# Patient Record
Sex: Male | Born: 1935 | Race: White | Hispanic: No | Smoking: Current every day smoker
Health system: Southern US, Community
[De-identification: ages and names within clinical notes are randomized; demographics above are authoritative.]

## PROBLEM LIST (undated history)

## (undated) DIAGNOSIS — R053 Chronic cough: Secondary | ICD-10-CM

## (undated) DIAGNOSIS — J3089 Other allergic rhinitis: Secondary | ICD-10-CM

## (undated) DIAGNOSIS — R05 Cough: Secondary | ICD-10-CM

## (undated) DIAGNOSIS — J9690 Respiratory failure, unspecified, unspecified whether with hypoxia or hypercapnia: Secondary | ICD-10-CM

## (undated) DIAGNOSIS — Z720 Tobacco use: Secondary | ICD-10-CM

## (undated) DIAGNOSIS — I480 Paroxysmal atrial fibrillation: Secondary | ICD-10-CM

## (undated) DIAGNOSIS — A0472 Enterocolitis due to Clostridium difficile, not specified as recurrent: Secondary | ICD-10-CM

## (undated) DIAGNOSIS — R062 Wheezing: Secondary | ICD-10-CM

## (undated) DIAGNOSIS — K319 Disease of stomach and duodenum, unspecified: Secondary | ICD-10-CM

## (undated) DIAGNOSIS — M1990 Unspecified osteoarthritis, unspecified site: Secondary | ICD-10-CM

## (undated) DIAGNOSIS — I10 Essential (primary) hypertension: Secondary | ICD-10-CM

## (undated) DIAGNOSIS — C801 Malignant (primary) neoplasm, unspecified: Secondary | ICD-10-CM

## (undated) DIAGNOSIS — Z85820 Personal history of malignant melanoma of skin: Secondary | ICD-10-CM

## (undated) DIAGNOSIS — R0600 Dyspnea, unspecified: Secondary | ICD-10-CM

## (undated) DIAGNOSIS — N183 Chronic kidney disease, stage 3 unspecified: Secondary | ICD-10-CM

## (undated) DIAGNOSIS — I499 Cardiac arrhythmia, unspecified: Secondary | ICD-10-CM

## (undated) DIAGNOSIS — R809 Proteinuria, unspecified: Secondary | ICD-10-CM

## (undated) DIAGNOSIS — F419 Anxiety disorder, unspecified: Secondary | ICD-10-CM

## (undated) DIAGNOSIS — R2689 Other abnormalities of gait and mobility: Secondary | ICD-10-CM

## (undated) DIAGNOSIS — F32A Depression, unspecified: Secondary | ICD-10-CM

## (undated) DIAGNOSIS — N281 Cyst of kidney, acquired: Secondary | ICD-10-CM

## (undated) DIAGNOSIS — R7989 Other specified abnormal findings of blood chemistry: Secondary | ICD-10-CM

## (undated) DIAGNOSIS — J811 Chronic pulmonary edema: Secondary | ICD-10-CM

## (undated) DIAGNOSIS — K219 Gastro-esophageal reflux disease without esophagitis: Secondary | ICD-10-CM

## (undated) DIAGNOSIS — D649 Anemia, unspecified: Secondary | ICD-10-CM

## (undated) DIAGNOSIS — N402 Nodular prostate without lower urinary tract symptoms: Secondary | ICD-10-CM

## (undated) DIAGNOSIS — E46 Unspecified protein-calorie malnutrition: Secondary | ICD-10-CM

## (undated) DIAGNOSIS — N189 Chronic kidney disease, unspecified: Secondary | ICD-10-CM

## (undated) DIAGNOSIS — F1010 Alcohol abuse, uncomplicated: Secondary | ICD-10-CM

## (undated) DIAGNOSIS — J189 Pneumonia, unspecified organism: Secondary | ICD-10-CM

## (undated) DIAGNOSIS — J449 Chronic obstructive pulmonary disease, unspecified: Secondary | ICD-10-CM

## (undated) DIAGNOSIS — K922 Gastrointestinal hemorrhage, unspecified: Secondary | ICD-10-CM

## (undated) DIAGNOSIS — F329 Major depressive disorder, single episode, unspecified: Secondary | ICD-10-CM

## (undated) HISTORY — PX: ANKLE FRACTURE SURGERY: SHX122

## (undated) HISTORY — PX: TYMPANOPLASTY W/ MASTOIDECTOMY: SUR1400

## (undated) HISTORY — DX: Unspecified osteoarthritis, unspecified site: M19.90

## (undated) HISTORY — PX: EXTERNAL EAR SURGERY: SHX627

## (undated) HISTORY — DX: Personal history of malignant melanoma of skin: Z85.820

## (undated) HISTORY — DX: Tobacco use: Z72.0

## (undated) HISTORY — PX: TONSILLECTOMY: SUR1361

## (undated) HISTORY — PX: ROTATOR CUFF REPAIR: SHX139

---
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
## 1998-12-07 ENCOUNTER — Emergency Department (HOSPITAL_COMMUNITY): Admission: EM | Admit: 1998-12-07 | Discharge: 1998-12-07 | Payer: Self-pay | Admitting: *Deleted

## 1998-12-21 ENCOUNTER — Encounter: Payer: Self-pay | Admitting: Otolaryngology

## 1998-12-21 ENCOUNTER — Encounter: Admission: RE | Admit: 1998-12-21 | Discharge: 1998-12-21 | Payer: Self-pay | Admitting: Otolaryngology

## 2003-05-06 ENCOUNTER — Other Ambulatory Visit: Payer: Self-pay

## 2004-06-24 ENCOUNTER — Emergency Department: Payer: Self-pay | Admitting: Emergency Medicine

## 2005-02-16 ENCOUNTER — Emergency Department: Payer: Self-pay | Admitting: Emergency Medicine

## 2005-04-21 ENCOUNTER — Emergency Department: Payer: Self-pay | Admitting: Emergency Medicine

## 2005-05-26 ENCOUNTER — Emergency Department: Payer: Self-pay | Admitting: Emergency Medicine

## 2006-08-17 ENCOUNTER — Emergency Department: Payer: Self-pay | Admitting: Unknown Physician Specialty

## 2007-01-11 ENCOUNTER — Emergency Department: Payer: Self-pay | Admitting: Emergency Medicine

## 2009-03-02 ENCOUNTER — Emergency Department: Payer: Self-pay | Admitting: Emergency Medicine

## 2009-04-01 ENCOUNTER — Emergency Department: Payer: Self-pay | Admitting: Emergency Medicine

## 2009-04-12 ENCOUNTER — Emergency Department: Payer: Self-pay | Admitting: Emergency Medicine

## 2009-08-03 ENCOUNTER — Emergency Department: Payer: Self-pay | Admitting: Emergency Medicine

## 2009-08-15 ENCOUNTER — Emergency Department: Payer: Self-pay | Admitting: Emergency Medicine

## 2009-08-23 ENCOUNTER — Emergency Department: Payer: Self-pay | Admitting: Internal Medicine

## 2009-09-02 ENCOUNTER — Emergency Department: Payer: Self-pay | Admitting: Internal Medicine

## 2009-09-10 ENCOUNTER — Emergency Department: Payer: Self-pay | Admitting: Emergency Medicine

## 2009-09-13 ENCOUNTER — Emergency Department: Payer: Self-pay | Admitting: Emergency Medicine

## 2009-09-20 ENCOUNTER — Emergency Department: Payer: Self-pay | Admitting: Emergency Medicine

## 2009-09-22 ENCOUNTER — Emergency Department: Payer: Self-pay | Admitting: Emergency Medicine

## 2009-11-12 ENCOUNTER — Emergency Department: Payer: Self-pay | Admitting: Unknown Physician Specialty

## 2010-03-07 ENCOUNTER — Emergency Department: Payer: Self-pay | Admitting: Internal Medicine

## 2010-07-29 ENCOUNTER — Emergency Department: Payer: Self-pay | Admitting: Unknown Physician Specialty

## 2010-09-16 ENCOUNTER — Emergency Department: Payer: Self-pay | Admitting: Internal Medicine

## 2013-09-05 ENCOUNTER — Emergency Department: Payer: Self-pay | Admitting: Emergency Medicine

## 2014-01-04 ENCOUNTER — Emergency Department: Payer: Self-pay | Admitting: Emergency Medicine

## 2014-01-30 ENCOUNTER — Emergency Department: Payer: Self-pay | Admitting: Internal Medicine

## 2014-10-21 ENCOUNTER — Encounter: Payer: Self-pay | Admitting: *Deleted

## 2014-10-21 ENCOUNTER — Emergency Department
Admission: EM | Admit: 2014-10-21 | Discharge: 2014-10-21 | Disposition: A | Discharge status: Home / Self Care | Payer: Medicare Other | Attending: Emergency Medicine | Admitting: Emergency Medicine

## 2014-10-21 DIAGNOSIS — G893 Neoplasm related pain (acute) (chronic): Secondary | ICD-10-CM | POA: Diagnosis not present

## 2014-10-21 DIAGNOSIS — C4322 Malignant melanoma of left ear and external auricular canal: Secondary | ICD-10-CM | POA: Diagnosis not present

## 2014-10-21 DIAGNOSIS — Z720 Tobacco use: Secondary | ICD-10-CM | POA: Insufficient documentation

## 2014-10-21 DIAGNOSIS — G8929 Other chronic pain: Secondary | ICD-10-CM

## 2014-10-21 DIAGNOSIS — M25561 Pain in right knee: Secondary | ICD-10-CM | POA: Insufficient documentation

## 2014-10-21 DIAGNOSIS — H9202 Otalgia, left ear: Secondary | ICD-10-CM | POA: Diagnosis present

## 2014-10-21 DIAGNOSIS — I10 Essential (primary) hypertension: Secondary | ICD-10-CM | POA: Insufficient documentation

## 2014-10-21 HISTORY — DX: Essential (primary) hypertension: I10

## 2014-10-21 HISTORY — DX: Chronic obstructive pulmonary disease, unspecified: J44.9

## 2014-10-21 MED ORDER — TRAMADOL HCL 50 MG PO TABS: 50.0000 mg | ORAL_TABLET | Freq: Four times a day (QID) | ORAL | Status: DC | PRN

## 2014-10-21 NOTE — Discharge Instructions (Signed)
Emergency care providers appreciate that many patients coming to us are in severe pain and we wish to address their pain in the safest, most responsible manner.  It is important to recognize, however, that the proper treatment of chronic pain differs from that of the pain of injuries and acute illnesses.  Our goal is to provider quality, safe, personalized care and we thank you for giving us the opportunity to serve you.  The use of narcotics and related agents for chronic pain syndromes may lead to additional physical and psychological problems.  Nearly as many people die from prescription narcotics each year as die from car crashes.  Additionally, this risk is increased if such prescriptions are obtained from a variety of sources.  Therefore, only your primary care physician or a pain management specialist is able to safely treat such syndromes with narcotic medications long-term.  Documentation revealing such prescriptions have been sought from multiple sources may prohibit us from providing a refill or different narcotic medication.  Your name may be checked first through the [place] Controlled Substances Reporting System.  This database is a record of controlled substance medication prescriptions that the patient has received.  This has been established by [place] in an effort to eliminate the dangerous, and often life-threatening, practice of obtaining multiple prescriptions from different medical providers.  If you have a chronic pain syndrome (i.e. chronic headaches, recurrent back or neck pain, dental pain, abdominal or pelvic pain without a specific diagnosis, or neuropathic pain such as fibromyalgia) or recurrent visits for the same condition without an acute diagnosis, you may be treated with non-narcotics and other non-addictive medicines.  Allergic reactions or negative side effects that may be reported by a patient to such medications will not typically lead to the use of a  narcotic analgesic or other controlled substance as an alternative.  Patients managing chronic pain with a personal physician should have provisions in place for breakthrough pain.  If you are in crisis, you should call your physician.  If your physician directs you to the emergency department, please have the doctor call and speak to our attending physician concerning your care.  When patients come to the Emergency Department (ED) with acute medical conditions in which the ED physician feels it is appropriate to prescribe narcotic or sedating pain medication, the physician will prescribe these in very limited quantities.  The amount of these medications will last only until you can see your primary care physician in his/her office.  Any patient who returns to the ED seeking refills should expect only non-narcotic pain medications.  In the event an acute medical condition exists and the emergency physician feels it is necessary that the patient be given a narcotic or sedating medication, a responsible adult driver should be present in the room prior to the medication being given by the nurse.  Prescriptions for narcotic or sedating medications that have been lost, stolen, or expired will NOT be refilled in the ED.  Patients who have chronic pain may receive non-narcotic prescriptions until seen by their primary care physician.  It is every patient's personal responsibility to maintain active prescriptions with his or her primary care physician or specialist.  

## 2014-10-21 NOTE — ED Notes (Addendum)
Pt reports left ear pain x 5 years, worse over past few weeks. 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 States out of tramadol that he usually takes for the pain.

## 2014-10-21 NOTE — ED Provider Notes (Signed)
[facility] Emergency Department Provider Note ____________________________________________  Time seen: Approximately 4:06 PM  I have reviewed the triage vital signs and the nursing notes.   HISTORY  Chief Complaint Otalgia   HPI Justin Andrews is a 80 y.o. male who presents to the emergency department for medication refill. He states that he's been out of his tramadol for the past 5 days. He is unable to get to [facility] today and is having significant pain in the left ear due to melanoma as well as the right knee due to arthritis. He denies new injury.   Past Medical History  Diagnosis Date  . Hypertension   . COPD (chronic obstructive pulmonary disease) (HCC)     There are no active problems to display for this patient.   Past Surgical History  Procedure Laterality Date  . External ear surgery    . Tonsillectomy      Current Outpatient Rx  Name  Route  Sig  Dispense  Refill  . traMADol (ULTRAM) 50 MG tablet   Oral   Take 1 tablet (50 mg total) by mouth every 6 (six) hours as needed.   12 tablet   0     Allergies Review of patient's allergies indicates no known allergies.  No family history on file.  Social History Social History  Substance Use Topics  . Smoking status: Current Every Day Smoker    Types: Cigarettes  . Smokeless tobacco: None  . Alcohol Use: No    Review of Systems Constitutional: No recent illness. Eyes: No visual changes. ENT: No sore throat. Cardiovascular: Denies chest pain or palpitations. Respiratory: Denies shortness of breath. Gastrointestinal: No abdominal pain.  Genitourinary: Negative for dysuria. Musculoskeletal: Pain in left ear and right knee Skin: Negative for rash. Positive for melanoma in the right ear. Neurological: Negative for headaches, focal weakness or numbness. 10-point ROS otherwise negative.  ____________________________________________   PHYSICAL EXAM:  VITAL SIGNS: ED Triage Vitals   Enc Vitals Group     BP 10/21/14 1539 180/97 mmHg     Pulse Rate 10/21/14 1539 81     Resp 10/21/14 1539 16     Temp 10/21/14 1539 97.9 F (36.6 C)     Temp Source 10/21/14 1539 Oral     SpO2 10/21/14 1539 98 %     Weight 10/21/14 1539 150 lb (68.04 kg)     Height 10/21/14 1539 5\' 9"  (1.753 m)     Head Cir --      Peak Flow --      Pain Score 10/21/14 1538 10     Pain Loc --      Pain Edu? --      Excl. in [place]? --     Constitutional: Alert and oriented. Well appearing and in no acute distress. Eyes: Conjunctivae are normal. EOMI. Head: Atraumatic. Nose: No congestion/rhinnorhea. Neck: No stridor.  Respiratory: Normal respiratory effort.   Musculoskeletal: Diffuse pain in the right knee. Patient ambulatory without assistance. Neurologic:  Normal speech and language. No gross focal neurologic deficits are appreciated. Speech is normal. No gait instability. Skin:  Skin is warm, dry and intact. Atraumatic. Psychiatric: Mood and affect are normal. Speech and behavior are normal.  ____________________________________________   LABS (all labs ordered are listed, but only abnormal results are displayed)  Labs Reviewed - No data to display ____________________________________________  RADIOLOGY  Not indicated ____________________________________________   PROCEDURES  Procedure(s) performed: None   ____________________________________________   INITIAL IMPRESSION / ASSESSMENT  AND PLAN / ED COURSE  Pertinent labs & imaging results that were available during my care of the patient were reviewed by me and considered in my medical decision making (see chart for details).  Patient was given a 3 day Rx for tramadol. He was given a copy of the emergency department chronic pain policy. He was advised to follow-up with his primary care provider for additional medication. ____________________________________________   FINAL CLINICAL IMPRESSION(S) / ED DIAGNOSES  Final  diagnoses:  Chronic pain       Victorino Dike, FNP 10/21/14 [place], FNP 10/21/14 1854  Hinda Kehr, MD 10/21/14 2037

## 2015-02-13 DIAGNOSIS — J431 Panlobular emphysema: Secondary | ICD-10-CM | POA: Diagnosis not present

## 2015-03-09 DIAGNOSIS — J449 Chronic obstructive pulmonary disease, unspecified: Secondary | ICD-10-CM | POA: Diagnosis not present

## 2015-04-19 DIAGNOSIS — C439 Malignant melanoma of skin, unspecified: Secondary | ICD-10-CM | POA: Diagnosis not present

## 2015-05-29 DIAGNOSIS — C439 Malignant melanoma of skin, unspecified: Secondary | ICD-10-CM | POA: Diagnosis not present

## 2015-07-17 DIAGNOSIS — G4723 Circadian rhythm sleep disorder, irregular sleep wake type: Secondary | ICD-10-CM | POA: Diagnosis not present

## 2015-08-02 DIAGNOSIS — G4723 Circadian rhythm sleep disorder, irregular sleep wake type: Secondary | ICD-10-CM | POA: Diagnosis not present

## 2015-08-15 DIAGNOSIS — M152 Bouchard's nodes (with arthropathy): Secondary | ICD-10-CM | POA: Diagnosis not present

## 2015-08-15 DIAGNOSIS — M151 Heberden's nodes (with arthropathy): Secondary | ICD-10-CM | POA: Diagnosis not present

## 2015-08-15 DIAGNOSIS — M180 Bilateral primary osteoarthritis of first carpometacarpal joints: Secondary | ICD-10-CM | POA: Diagnosis not present

## 2015-08-15 DIAGNOSIS — Z79899 Other long term (current) drug therapy: Secondary | ICD-10-CM | POA: Diagnosis not present

## 2015-08-18 DIAGNOSIS — M19041 Primary osteoarthritis, right hand: Secondary | ICD-10-CM | POA: Diagnosis not present

## 2015-08-18 DIAGNOSIS — M65311 Trigger thumb, right thumb: Secondary | ICD-10-CM | POA: Diagnosis not present

## 2015-08-18 DIAGNOSIS — D0322 Melanoma in situ of left ear and external auricular canal: Secondary | ICD-10-CM | POA: Diagnosis not present

## 2015-08-18 DIAGNOSIS — M180 Bilateral primary osteoarthritis of first carpometacarpal joints: Secondary | ICD-10-CM | POA: Diagnosis not present

## 2015-08-24 DIAGNOSIS — G4733 Obstructive sleep apnea (adult) (pediatric): Secondary | ICD-10-CM | POA: Diagnosis not present

## 2015-09-13 ENCOUNTER — Emergency Department
Admission: EM | Admit: 2015-09-13 | Discharge: 2015-09-13 | Disposition: A | Discharge status: Home / Self Care | Payer: Medicare HMO | Attending: Student in an Organized Health Care Education/Training Program | Admitting: Student in an Organized Health Care Education/Training Program

## 2015-09-13 DIAGNOSIS — J449 Chronic obstructive pulmonary disease, unspecified: Secondary | ICD-10-CM | POA: Diagnosis not present

## 2015-09-13 DIAGNOSIS — I10 Essential (primary) hypertension: Secondary | ICD-10-CM | POA: Insufficient documentation

## 2015-09-13 DIAGNOSIS — G4733 Obstructive sleep apnea (adult) (pediatric): Secondary | ICD-10-CM | POA: Diagnosis not present

## 2015-09-13 DIAGNOSIS — N12 Tubulo-interstitial nephritis, not specified as acute or chronic: Secondary | ICD-10-CM | POA: Diagnosis not present

## 2015-09-13 DIAGNOSIS — R69 Illness, unspecified: Secondary | ICD-10-CM | POA: Diagnosis not present

## 2015-09-13 DIAGNOSIS — J430 Unilateral pulmonary emphysema [MacLeod's syndrome]: Secondary | ICD-10-CM | POA: Diagnosis not present

## 2015-09-13 DIAGNOSIS — M545 Low back pain: Secondary | ICD-10-CM | POA: Diagnosis present

## 2015-09-13 DIAGNOSIS — F17210 Nicotine dependence, cigarettes, uncomplicated: Secondary | ICD-10-CM | POA: Insufficient documentation

## 2015-09-13 LAB — URINALYSIS COMPLETE WITH MICROSCOPIC (ARMC ONLY)
BILIRUBIN URINE: NEGATIVE
Bacteria, UA: NONE SEEN
Glucose, UA: NEGATIVE mg/dL
Ketones, ur: NEGATIVE mg/dL
Nitrite: POSITIVE — AB
PH: 5 (ref 5.0–8.0)
PROTEIN: 30 mg/dL — AB
SQUAMOUS EPITHELIAL / LPF: NONE SEEN
Specific Gravity, Urine: 1.015 (ref 1.005–1.030)

## 2015-09-13 MED ORDER — SULFAMETHOXAZOLE-TRIMETHOPRIM 800-160 MG PO TABS: 1.0000 | ORAL_TABLET | Freq: Two times a day (BID) | ORAL | 0 refills | Status: DC

## 2015-09-13 MED ORDER — PHENAZOPYRIDINE HCL 95 MG PO TABS: 95.0000 mg | ORAL_TABLET | Freq: Three times a day (TID) | ORAL | 0 refills | Status: DC | PRN

## 2015-09-13 MED ORDER — TRAMADOL HCL 50 MG PO TABS: 50.0000 mg | ORAL_TABLET | Freq: Four times a day (QID) | ORAL | 0 refills | Status: DC | PRN

## 2015-09-13 MED ORDER — SULFAMETHOXAZOLE-TRIMETHOPRIM 800-160 MG PO TABS
1.0000 | ORAL_TABLET | Freq: Once | ORAL | Status: AC
  Administered 2015-09-13: 1 via ORAL
  Filled 2015-09-13: qty 1

## 2015-09-13 NOTE — ED Notes (Signed)
AAOx3.  Skin warm and dry.  Ambulates with easy and steady gait.  C/O low back pain x 3 days.  Denies dysuria

## 2015-09-13 NOTE — ED Provider Notes (Signed)
[facility] Emergency Department Provider Note  ____________________________________________  Time seen: Approximately 8:20 PM  I have reviewed the triage vital signs and the nursing notes.   HISTORY  Chief Complaint Back Pain    HPI Justin Andrews is a 81 y.o. male who presents emergency department complaining of right lower back/flank pain. Patient reports that symptoms have been ongoing 3-4 days. Pain is sharp, constant. Patient also reports dysuria. He denies any hematuria. He denies a history of kidney stones. He denies a history of UTIs. Patient denies any abdominal pain, nausea vomiting, constipation or diarrhea. He denies any fevers or chills.   Past Medical History:  Diagnosis Date  . COPD (chronic obstructive pulmonary disease) ([place])   . Hypertension     There are no active problems to display for this patient.   Past Surgical History:  Procedure Laterality Date  . EXTERNAL EAR SURGERY    . TONSILLECTOMY      Prior to Admission medications   Medication Sig Start Date End Date Taking? Authorizing Provider  phenazopyridine (PYRIDIUM) 95 MG tablet Take 1 tablet (95 mg total) by mouth 3 (three) times daily as needed for pain. 09/13/15   Charline Bills Cuthriell, PA-C  sulfamethoxazole-trimethoprim (BACTRIM DS,SEPTRA DS) 800-160 MG tablet Take 1 tablet by mouth 2 (two) times daily. 09/13/15   Charline Bills Cuthriell, PA-C  traMADol (ULTRAM) 50 MG tablet Take 1 tablet (50 mg total) by mouth every 6 (six) hours as needed. 09/13/15   Charline Bills Cuthriell, PA-C    Allergies Aspirin  No family history on file.  Social History Social History  Substance Use Topics  . Smoking status: Current Every Day Smoker    Types: Cigarettes  . Smokeless tobacco: Not on file  . Alcohol use No     Review of Systems  Constitutional: No fever/chills Cardiovascular: no chest pain. Respiratory: no cough. No SOB. Gastrointestinal: No abdominal pain.  No nausea, no  vomiting.  No diarrhea.  No constipation. Genitourinary: Positive for dysuria and polyuria. Positive for right flank pain.. No hematuria Musculoskeletal: Negative for musculoskeletal pain. Skin: Negative for rash, abrasions, lacerations, ecchymosis. Neurological: Negative for headaches, focal weakness or numbness. 10-point ROS otherwise negative.  ____________________________________________   PHYSICAL EXAM:  VITAL SIGNS: ED Triage Vitals  Enc Vitals Group     BP 09/13/15 1926 (!) 187/78     Pulse Rate 09/13/15 1926 82     Resp 09/13/15 1926 18     Temp 09/13/15 1926 98.1 F (36.7 C)     Temp Source 09/13/15 1926 Oral     SpO2 09/13/15 1926 97 %     Weight 09/13/15 1927 146 lb (66.2 kg)     Height 09/13/15 1927 5\' 9"  (1.753 m)     Head Circumference --      Peak Flow --      Pain Score 09/13/15 1927 8     Pain Loc --      Pain Edu? --      Excl. in [place]? --      Constitutional: Alert and oriented. Well appearing and in no acute distress. Eyes: Conjunctivae are normal. PERRL. EOMI. Head: Atraumatic. Neck: No stridor.    Cardiovascular: Normal rate, regular rhythm. Normal S1 and S2.  Good peripheral circulation. Respiratory: Normal respiratory effort without tachypnea or retractions. Lungs CTAB. Good air entry to the bases with no decreased or absent breath sounds. Gastrointestinal: Bowel sounds 4 quadrants. Soft and nontender to palpation. No guarding or  rigidity. No palpable masses. No distention. Positive for right-sided CVA tenderness. Musculoskeletal: Full range of motion to all extremities. No gross deformities appreciated. Neurologic:  Normal speech and language. No gross focal neurologic deficits are appreciated.  Skin:  Skin is warm, dry and intact. No rash noted. Psychiatric: Mood and affect are normal. Speech and behavior are normal. Patient exhibits appropriate insight and judgement.   ____________________________________________   LABS (all labs ordered are  listed, but only abnormal results are displayed)  Labs Reviewed  URINALYSIS COMPLETEWITH MICROSCOPIC ([place] ONLY) - Abnormal; Notable for the following:       Result Value   Color, Urine YELLOW (*)    APPearance HAZY (*)    Hgb urine dipstick 2+ (*)    Protein, ur 30 (*)    Nitrite POSITIVE (*)    Leukocytes, UA 3+ (*)    All other components within normal limits  URINE CULTURE   ____________________________________________  EKG   ____________________________________________  RADIOLOGY   No results found.  ____________________________________________    PROCEDURES  Procedure(s) performed:    Procedures    Medications  sulfamethoxazole-trimethoprim (BACTRIM DS,SEPTRA DS) 800-160 MG per tablet 1 tablet (1 tablet Oral Given 09/13/15 2041)     ____________________________________________   INITIAL IMPRESSION / ASSESSMENT AND PLAN / ED COURSE  Pertinent labs & imaging results that were available during my care of the patient were reviewed by me and considered in my medical decision making (see chart for details).  Review of the [place] CSRS was performed in accordance of the [place] prior to dispensing any controlled drugs.  Clinical Course    Patient's diagnosis is consistent with Pyelonephritis in a male patient. Patient denies any previous history of kidney stones or UTIs. No recent antibiotic use. Patient has positive right-sided CVA tenderness. Urinalysis returned with signs of infection. Patient is given strict precautions to return for any worsening of his symptoms. If symptoms have not resolved or significantly improved in the next 72-96 hours, patient is to return to the emergency department for further evaluation. Urine is cultured at this time.. Patient will be discharged home with prescriptions for Bactrim, limited pain medication, Pyridium. Patient is to follow up with primary care as needed or otherwise directed. Patient is given ED precautions to return to the  ED for any worsening or new symptoms.     ____________________________________________  FINAL CLINICAL IMPRESSION(S) / ED DIAGNOSES  Final diagnoses:  Pyelonephritis      NEW MEDICATIONS STARTED DURING THIS VISIT:  New Prescriptions   PHENAZOPYRIDINE (PYRIDIUM) 95 MG TABLET    Take 1 tablet (95 mg total) by mouth 3 (three) times daily as needed for pain.   SULFAMETHOXAZOLE-TRIMETHOPRIM (BACTRIM DS,SEPTRA DS) 800-160 MG TABLET    Take 1 tablet by mouth 2 (two) times daily.   TRAMADOL (ULTRAM) 50 MG TABLET    Take 1 tablet (50 mg total) by mouth every 6 (six) hours as needed.        This chart was dictated using voice recognition software/Dragon. Despite best efforts to proofread, errors can occur which can change the meaning. Any change was purely unintentional.    Darletta Moll, PA-C 09/13/15 2046    Merlyn Lot, MD 09/13/15 2230

## 2015-09-13 NOTE — ED Triage Notes (Signed)
Pt in with right lower back pain x 3 days, denies any injury but pain is more on movement and walking. Denies any radiation to abdomen and no dysuria. Pt is ambulatory with pain noted when standing.

## 2015-09-13 NOTE — ED Notes (Signed)
AAOx3.  Skin warm and dry.  Ambulates with easy and steady gait.  Posture upright and relaxed.  NAD

## 2015-09-16 LAB — URINE CULTURE
Culture: >=100000 — AB
Special Requests: NORMAL

## 2015-10-12 DIAGNOSIS — R69 Illness, unspecified: Secondary | ICD-10-CM | POA: Diagnosis not present

## 2015-10-12 DIAGNOSIS — G4733 Obstructive sleep apnea (adult) (pediatric): Secondary | ICD-10-CM | POA: Diagnosis not present

## 2015-10-12 DIAGNOSIS — N3000 Acute cystitis without hematuria: Secondary | ICD-10-CM | POA: Diagnosis not present

## 2015-11-06 DIAGNOSIS — G4733 Obstructive sleep apnea (adult) (pediatric): Secondary | ICD-10-CM | POA: Diagnosis not present

## 2015-11-06 DIAGNOSIS — N200 Calculus of kidney: Secondary | ICD-10-CM | POA: Diagnosis not present

## 2015-11-18 DIAGNOSIS — R69 Illness, unspecified: Secondary | ICD-10-CM | POA: Diagnosis not present

## 2015-11-20 ENCOUNTER — Other Ambulatory Visit: Payer: Self-pay

## 2015-12-09 ENCOUNTER — Encounter: Payer: Self-pay | Admitting: Emergency Medicine

## 2015-12-09 ENCOUNTER — Emergency Department
Admission: EM | Admit: 2015-12-09 | Discharge: 2015-12-09 | Disposition: A | Discharge status: Home / Self Care | Payer: Medicare HMO | Attending: Student in an Organized Health Care Education/Training Program | Admitting: Student in an Organized Health Care Education/Training Program

## 2015-12-09 ENCOUNTER — Emergency Department: Payer: Medicare HMO

## 2015-12-09 DIAGNOSIS — I10 Essential (primary) hypertension: Secondary | ICD-10-CM | POA: Insufficient documentation

## 2015-12-09 DIAGNOSIS — W06XXXA Fall from bed, initial encounter: Secondary | ICD-10-CM | POA: Diagnosis not present

## 2015-12-09 DIAGNOSIS — S2242XA Multiple fractures of ribs, left side, initial encounter for closed fracture: Secondary | ICD-10-CM | POA: Diagnosis not present

## 2015-12-09 DIAGNOSIS — Y999 Unspecified external cause status: Secondary | ICD-10-CM | POA: Insufficient documentation

## 2015-12-09 DIAGNOSIS — J449 Chronic obstructive pulmonary disease, unspecified: Secondary | ICD-10-CM | POA: Insufficient documentation

## 2015-12-09 DIAGNOSIS — S299XXA Unspecified injury of thorax, initial encounter: Secondary | ICD-10-CM | POA: Diagnosis present

## 2015-12-09 DIAGNOSIS — R109 Unspecified abdominal pain: Secondary | ICD-10-CM | POA: Diagnosis not present

## 2015-12-09 DIAGNOSIS — Z23 Encounter for immunization: Secondary | ICD-10-CM | POA: Insufficient documentation

## 2015-12-09 DIAGNOSIS — F17210 Nicotine dependence, cigarettes, uncomplicated: Secondary | ICD-10-CM | POA: Insufficient documentation

## 2015-12-09 DIAGNOSIS — R1012 Left upper quadrant pain: Secondary | ICD-10-CM | POA: Diagnosis not present

## 2015-12-09 DIAGNOSIS — Y929 Unspecified place or not applicable: Secondary | ICD-10-CM | POA: Insufficient documentation

## 2015-12-09 DIAGNOSIS — Z79899 Other long term (current) drug therapy: Secondary | ICD-10-CM | POA: Diagnosis not present

## 2015-12-09 DIAGNOSIS — Y9389 Activity, other specified: Secondary | ICD-10-CM | POA: Insufficient documentation

## 2015-12-09 DIAGNOSIS — S3991XA Unspecified injury of abdomen, initial encounter: Secondary | ICD-10-CM | POA: Diagnosis not present

## 2015-12-09 DIAGNOSIS — Z85828 Personal history of other malignant neoplasm of skin: Secondary | ICD-10-CM | POA: Diagnosis not present

## 2015-12-09 DIAGNOSIS — R69 Illness, unspecified: Secondary | ICD-10-CM | POA: Diagnosis not present

## 2015-12-09 HISTORY — DX: Malignant (primary) neoplasm, unspecified: C80.1

## 2015-12-09 LAB — COMPREHENSIVE METABOLIC PANEL
ALBUMIN: 4.3 g/dL (ref 3.5–5.0)
ALT: 12 U/L — AB (ref 17–63)
AST: 19 U/L (ref 15–41)
Alkaline Phosphatase: 59 U/L (ref 38–126)
Anion gap: 7 (ref 5–15)
BILIRUBIN TOTAL: 0.4 mg/dL (ref 0.3–1.2)
BUN: 28 mg/dL — AB (ref 6–20)
CO2: 27 mmol/L (ref 22–32)
CREATININE: 1.83 mg/dL — AB (ref 0.61–1.24)
Calcium: 9.3 mg/dL (ref 8.9–10.3)
Chloride: 106 mmol/L (ref 101–111)
GFR calc Af Amer: 39 mL/min — ABNORMAL LOW (ref >60)
GFR calc non Af Amer: 33 mL/min — ABNORMAL LOW (ref >60)
GLUCOSE: 97 mg/dL (ref 65–99)
POTASSIUM: 4.2 mmol/L (ref 3.5–5.1)
Sodium: 140 mmol/L (ref 135–145)
TOTAL PROTEIN: 7.8 g/dL (ref 6.5–8.1)

## 2015-12-09 LAB — CBC
HEMATOCRIT: 36.7 % — AB (ref 40.0–52.0)
HEMOGLOBIN: 12.5 g/dL — AB (ref 13.0–18.0)
MCH: 31.8 pg (ref 26.0–34.0)
MCHC: 34 g/dL (ref 32.0–36.0)
MCV: 93.3 fL (ref 80.0–100.0)
Platelets: 224 10*3/uL (ref 150–440)
RBC: 3.93 MIL/uL — AB (ref 4.40–5.90)
RDW: 13.3 % (ref 11.5–14.5)
WBC: 12.7 10*3/uL — AB (ref 3.8–10.6)

## 2015-12-09 LAB — TYPE AND SCREEN
ABO/RH(D): A POS
Antibody Screen: NEGATIVE

## 2015-12-09 MED ORDER — TRAMADOL HCL 50 MG PO TABS
50.0000 mg | ORAL_TABLET | ORAL | Status: AC
  Administered 2015-12-09: 50 mg via ORAL
  Filled 2015-12-09: qty 1

## 2015-12-09 MED ORDER — TETANUS-DIPHTH-ACELL PERTUSSIS 5-2.5-18.5 LF-MCG/0.5 IM SUSP
0.5000 mL | Freq: Once | INTRAMUSCULAR | Status: AC
  Administered 2015-12-09: 0.5 mL via INTRAMUSCULAR
  Filled 2015-12-09: qty 0.5

## 2015-12-09 MED ORDER — SODIUM CHLORIDE 0.9 % IV BOLUS (SEPSIS)
1000.0000 mL | Freq: Once | INTRAVENOUS | Status: AC
  Administered 2015-12-09: 1000 mL via INTRAVENOUS

## 2015-12-09 MED ORDER — IOPAMIDOL (ISOVUE-300) INJECTION 61%
75.0000 mL | Freq: Once | INTRAVENOUS | Status: AC | PRN
  Administered 2015-12-09: 75 mL via INTRAVENOUS
  Filled 2015-12-09: qty 75

## 2015-12-09 MED ORDER — BACITRACIN ZINC 500 UNIT/GM EX OINT
TOPICAL_OINTMENT | CUTANEOUS | Status: DC | PRN
  Administered 2015-12-09: 1 via TOPICAL

## 2015-12-09 MED ORDER — BACITRACIN ZINC 500 UNIT/GM EX OINT
TOPICAL_OINTMENT | CUTANEOUS | Status: AC
  Administered 2015-12-09: 1 via TOPICAL
  Filled 2015-12-09: qty 0.9

## 2015-12-09 MED ORDER — ACETAMINOPHEN 325 MG PO TABS
650.0000 mg | ORAL_TABLET | Freq: Once | ORAL | Status: AC
  Administered 2015-12-09: 650 mg via ORAL
  Filled 2015-12-09: qty 2

## 2015-12-09 MED ORDER — TRAMADOL HCL 50 MG PO TABS: 50.0000 mg | ORAL_TABLET | Freq: Four times a day (QID) | ORAL | 0 refills | Status: DC | PRN

## 2015-12-09 NOTE — ED Provider Notes (Signed)
[facility] Emergency Department Provider Note ____________________________________________  Time seen: Approximately 3:25 PM  I have reviewed the triage vital signs and the nursing notes.   HISTORY  Chief Complaint Fall    HPI Justin Andrews is a 81 y.o. male that presents out after rolling out of bed on Thursday morning. Patient states he injured his left side and has several cuts over his hand. Patient remembers having a bad dream, which caused him to roll out of bed. Patient states that he was on the floor for less than 10 minutes before getting up. Patient states that he has had difficulty breathing since accident due to pain. Patient has had a nonproductive cough since accident. Patient did not see anybody after accident and decided to come to the ER today after realizing he was in a significant amount of pain. Patient denies hitting his head, loss of consciousness, chest pain. Patient lives with his sister, whom he takes care of. Patient has smoked a pack a day since he was 14. Patient does not remember last tetanus shot.  Past Medical History:  Diagnosis Date  . Cancer ([place])    skin  . COPD (chronic obstructive pulmonary disease) ([place])   . Hypertension     There are no active problems to display for this patient.   Past Surgical History:  Procedure Laterality Date  . EXTERNAL EAR SURGERY    . TONSILLECTOMY      Prior to Admission medications   Medication Sig Start Date End Date Taking? Authorizing Provider  phenazopyridine (PYRIDIUM) 95 MG tablet Take 1 tablet (95 mg total) by mouth 3 (three) times daily as needed for pain. 09/13/15   Charline Bills Cuthriell, PA-C  sulfamethoxazole-trimethoprim (BACTRIM DS,SEPTRA DS) 800-160 MG tablet Take 1 tablet by mouth 2 (two) times daily. 09/13/15   Charline Bills Cuthriell, PA-C  traMADol (ULTRAM) 50 MG tablet Take 1 tablet (50 mg total) by mouth every 6 (six) hours as needed. 12/09/15 12/08/16  Laban Emperor, PA-C     Allergies Aspirin  History reviewed. No pertinent family history.  Social History Social History  Substance Use Topics  . Smoking status: Current Every Day Smoker    Types: Cigarettes  . Smokeless tobacco: Never Used  . Alcohol use No    Review of Systems Constitutional: No recent illness. Cardiovascular: Denies chest pain or palpitations. Respiratory: Positive for shortness of breath and cough.  Abdominal: Upper left quadrant tenderness.  Musculoskeletal: Pain in right side.  Genitourinary: Negative for dysuria. Skin: Negative for rash. Neurological: Negative for focal weakness or numbness.  ____________________________________________   PHYSICAL EXAM:  VITAL SIGNS: ED Triage Vitals  Enc Vitals Group     BP 12/09/15 1433 (!) 180/81     Pulse Rate 12/09/15 1433 (!) 101     Resp 12/09/15 1433 18     Temp 12/09/15 1433 97.8 F (36.6 C)     Temp src --      SpO2 12/09/15 1433 98 %     Weight 12/09/15 1434 145 lb (65.8 kg)     Height 12/09/15 1434 5\' 9"  (1.753 m)     Head Circumference --      Peak Flow --      Pain Score 12/09/15 1434 9     Pain Loc --      Pain Edu? --      Excl. in [place]? --     Constitutional: Alert and oriented. Well appearing and in no acute distress. Eyes:  Conjunctivae are normal. EOMI. Head: Atraumatic. Neck: No stridor.  Cardiovascular: Regular rate and rhythm.  Respiratory: Normal respiratory effort. Expiratory wheezes bilaterally. Abdominal: Normoactive bowel sounds. Tenderness to palpation in upper left quadrant.  Musculoskeletal: Tender to palpation over lower rib cage over left back and side. Neurologic:  Normal speech and language. No gross focal neurologic deficits are appreciated. Speech is normal. No gait instability. Skin:  Skin is warm, dry and intact. Atraumatic. Psychiatric: Mood and affect are normal. Speech and behavior are normal.  ____________________________________________   LABS (all labs ordered are listed,  but only abnormal results are displayed)  Labs Reviewed  CBC - Abnormal; Notable for the following:       Result Value   WBC 12.7 (*)    RBC 3.93 (*)    Hemoglobin 12.5 (*)    HCT 36.7 (*)    All other components within normal limits  COMPREHENSIVE METABOLIC PANEL - Abnormal; Notable for the following:    BUN 28 (*)    Creatinine, Ser 1.83 (*)    ALT 12 (*)    GFR calc non Af Amer 33 (*)    GFR calc Af Amer 39 (*)    All other components within normal limits  TYPE AND SCREEN   ____________________________________________  RADIOLOGY   Xray results per radiology FINDINGS:  There appear to be acute nondisplaced fractures of the LEFT seventh  and ninth ribs posterolaterally, possibly the eighth rib. Numerous  chronic healed rib fractures, with chest wall deformity, are seen on  the RIGHT. There is moderate hyperinflation consistent with COPD.  Normal heart size. Calcified tortuous aorta. Surgical clips are seen  at the LEFT lung apex. There is no pneumothorax. Thoracic  spondylosis is noted. No effusion is seen.    IMPRESSION:  Acute fractures of the LEFT seventh and ninth ribs posterolaterally,  possibly the eighth rib.    CT results per radiology 1. No evidence of significant acute traumatic injury to the abdomen  or pelvis.  2. No acute findings in the abdomen or pelvis.  3. Aortic atherosclerosis, in addition to at least 2 vessel coronary  artery disease. Assessment for potential risk factor modification,  dietary therapy or pharmacologic therapy may be warranted, if  clinically indicated.  4. Additional incidental findings, as above.     PROCEDURES  Procedure(s) performed:    INITIAL IMPRESSION / ASSESSMENT AND PLAN / ED COURSE  Clinical Course     Pertinent labs & imaging results that were available during my care of the patient were reviewed by me and considered in my medical decision making (see chart for details).  Patient has 7th and 9th left rib  fractures. He has several shallow scratches and 1 skin tear to left hand. Patient was advised to follow up with PCP to monitor healing of skin tear. Tetanus shot was updated.  Because of upper left abdominal pain, a CT was obtained of the abdomen. CT did not show any evidence of injury to abdomen. Upper left abdominal pain is likely referred pain from broken ribs. Patient denies any head trauma or confusion so imaging is not necessary. Patient was able to give a detailed account of the accident and why he rolled out of bed. Blood pressure and tachycardia are likely secondary to pain. Patient was given tramadol for pain in Ed. Patient was given tramadol for pain for the weekend and has appointment with PCP on Monday and will discuss pain management. Patient was advised to follow up with  PCP regarding atherosclerosis seen on CT. All findings were disclosed to patient. Patient was advised to return to ED for any persisting or changing symptoms. All questions were answered.    FINAL CLINICAL IMPRESSION(S) / ED DIAGNOSES  Final diagnoses:  Closed fracture of multiple ribs of left side, initial encounter      Laban Emperor, PA-C 12/09/15 2101    Merlyn Lot, MD 12/10/15 [phone]

## 2015-12-09 NOTE — ED Triage Notes (Signed)
Pt fell on thurs morning - states rolled out of bed and hit his left ribs on the dresser and skinned his rt hand.

## 2015-12-09 NOTE — ED Notes (Signed)
Patient transported to CT 

## 2015-12-09 NOTE — ED Notes (Signed)

## 2015-12-09 NOTE — ED Notes (Signed)
See triage note. Pt reports falling Thursday morning and hitting L ribs on his dresser. Multiple old bandaids noted to bil hands with scabs present. Pt c/o cough also starting thursday, states he is unable to cough deeply d/t pain.

## 2015-12-11 DIAGNOSIS — G4733 Obstructive sleep apnea (adult) (pediatric): Secondary | ICD-10-CM | POA: Diagnosis not present

## 2015-12-18 ENCOUNTER — Encounter: Payer: Self-pay | Admitting: Emergency Medicine

## 2015-12-18 ENCOUNTER — Emergency Department: Payer: Medicare HMO

## 2015-12-18 ENCOUNTER — Emergency Department
Admission: EM | Admit: 2015-12-18 | Discharge: 2015-12-18 | Disposition: A | Discharge status: Home / Self Care | Payer: Medicare HMO | Attending: Emergency Medicine | Admitting: Emergency Medicine

## 2015-12-18 DIAGNOSIS — J449 Chronic obstructive pulmonary disease, unspecified: Secondary | ICD-10-CM | POA: Insufficient documentation

## 2015-12-18 DIAGNOSIS — I10 Essential (primary) hypertension: Secondary | ICD-10-CM | POA: Insufficient documentation

## 2015-12-18 DIAGNOSIS — F17210 Nicotine dependence, cigarettes, uncomplicated: Secondary | ICD-10-CM | POA: Insufficient documentation

## 2015-12-18 DIAGNOSIS — R0781 Pleurodynia: Secondary | ICD-10-CM | POA: Diagnosis not present

## 2015-12-18 DIAGNOSIS — Z85828 Personal history of other malignant neoplasm of skin: Secondary | ICD-10-CM | POA: Insufficient documentation

## 2015-12-18 DIAGNOSIS — S299XXA Unspecified injury of thorax, initial encounter: Secondary | ICD-10-CM | POA: Diagnosis not present

## 2015-12-18 DIAGNOSIS — S2242XG Multiple fractures of ribs, left side, subsequent encounter for fracture with delayed healing: Secondary | ICD-10-CM | POA: Diagnosis not present

## 2015-12-18 DIAGNOSIS — S299XXD Unspecified injury of thorax, subsequent encounter: Secondary | ICD-10-CM | POA: Diagnosis present

## 2015-12-18 DIAGNOSIS — S2232XG Fracture of one rib, left side, subsequent encounter for fracture with delayed healing: Secondary | ICD-10-CM

## 2015-12-18 DIAGNOSIS — W19XXXD Unspecified fall, subsequent encounter: Secondary | ICD-10-CM | POA: Insufficient documentation

## 2015-12-18 DIAGNOSIS — R69 Illness, unspecified: Secondary | ICD-10-CM | POA: Diagnosis not present

## 2015-12-18 MED ORDER — OXYCODONE-ACETAMINOPHEN 5-325 MG PO TABS: 1.0000 | ORAL_TABLET | Freq: Three times a day (TID) | ORAL | 0 refills | Status: DC | PRN

## 2015-12-18 MED ORDER — OXYCODONE-ACETAMINOPHEN 5-325 MG PO TABS
1.0000 | ORAL_TABLET | Freq: Once | ORAL | Status: AC
  Administered 2015-12-18: 1 via ORAL
  Filled 2015-12-18: qty 1

## 2015-12-18 NOTE — ED Notes (Signed)
See triage note  States he was seen about 9 days ago and dx'd with rib fractures   States pain was tolerable initially but now pain has increased   Denies new injury

## 2015-12-18 NOTE — ED Provider Notes (Signed)
[facility] Emergency Department Provider Note  ____________________________________________  Time seen: Approximately 4:58 PM  I have reviewed the triage vital signs and the nursing notes.   HISTORY  Chief Complaint Chest Pain    HPI Justin Andrews is a 81 y.o. male presents to the emergency department for mid left back pain. Patient was seen in the emergency department 9 days ago and was diagnosed with rib fractures secondary to a fall. Patient states that pain is improving but there is one spot that is still very painful. Patient describes the pain as sharp. Patient denies rash or bruising. Patient is not having difficulty walking or moving extremities. Tramadol did not help pain. Patient smokes and has a history of COPD.   Past Medical History:  Diagnosis Date  . Cancer ([place])    skin  . COPD (chronic obstructive pulmonary disease) ([place])   . Hypertension     There are no active problems to display for this patient.   Past Surgical History:  Procedure Laterality Date  . EXTERNAL EAR SURGERY    . TONSILLECTOMY      Prior to Admission medications   Medication Sig Start Date End Date Taking? Authorizing Provider  oxyCODONE-acetaminophen (ROXICET) 5-325 MG tablet Take 1 tablet by mouth every 8 (eight) hours as needed. 12/18/15 12/17/16  Laban Emperor, PA-C  traMADol (ULTRAM) 50 MG tablet Take 1 tablet (50 mg total) by mouth every 6 (six) hours as needed. 12/09/15 12/08/16  Laban Emperor, PA-C    Allergies Aspirin  No family history on file.  Social History Social History  Substance Use Topics  . Smoking status: Current Every Day Smoker    Types: Cigarettes  . Smokeless tobacco: Never Used  . Alcohol use No     Review of Systems  Constitutional: No fever/chills ENT: No upper respiratory complaints. Cardiovascular: no chest pain. Respiratory: no cough. No SOB. Gastrointestinal: No abdominal pain.  No nausea, no vomiting.  No diarrhea.  No  constipation. Musculoskeletal: Negative for musculoskeletal pain. Skin: Negative for rash, abrasions, lacerations, ecchymosis. Neurological: Negative for headaches, focal weakness or numbness.  ____________________________________________   PHYSICAL EXAM:  VITAL SIGNS: ED Triage Vitals  Enc Vitals Group     BP 12/18/15 1424 (!) 164/87     Pulse Rate 12/18/15 1424 96     Resp 12/18/15 1424 20     Temp 12/18/15 1424 98.4 F (36.9 C)     Temp Source 12/18/15 1424 Oral     SpO2 12/18/15 1424 97 %     Weight 12/18/15 1425 155 lb (70.3 kg)     Height 12/18/15 1425 5\' 9"  (1.753 m)     Head Circumference --      Peak Flow --      Pain Score 12/18/15 1426 10     Pain Loc --      Pain Edu? --      Excl. in [place]? --      Constitutional: Alert and oriented. Well appearing and in no acute distress. Eyes: Conjunctivae are normal.  Head: Atraumatic. ENT:      Ears:       Nose: No congestion/rhinnorhea.      Mouth/Throat: Mucous membranes are moist.  Neck: No stridor.  No cervical spine tenderness to palpation. Cardiovascular: Normal rate, regular rhythm. Normal S1 and S2.  Good peripheral circulation. Respiratory: Normal respiratory effort without tachypnea or retractions. Lungs CTAB. Good air entry to the bases with no decreased or absent breath sounds.  Gastrointestinal: Bowel sounds 4 quadrants. Soft and nontender to palpation.  Musculoskeletal: Full range of motion to all extremities. No gross deformities appreciated. Tenderness to palpation over posterior eighth rib on left side. Neurologic:  Normal speech and language. No gross focal neurologic deficits are appreciated.  Skin:  Skin is warm, dry and intact. No rash noted. Psychiatric: Mood and affect are normal. Speech and behavior are normal. Patient exhibits appropriate insight and judgement.   ____________________________________________   LABS (all labs ordered are listed, but only abnormal results are displayed)  Labs  Reviewed - No data to display ____________________________________________  EKG   ____________________________________________  RADIOLOGY Robinette Haines, personally viewed and evaluated these images (plain radiographs) as part of my medical decision making, as well as reviewing the written report by the radiologist.  Dg Chest 2 View  Result Date: 12/18/2015 CLINICAL DATA:  Increased left ribcage pain especially anteriorly. Diagnosis with left-sided rib fractures 9 days ago following a fall. No report of re-injury. History of COPD. Current smoker. EXAM: CHEST  2 VIEW COMPARISON:  Chest x-ray of December 09, 2015 FINDINGS: The lungs are hyperinflated with hemidiaphragm flattening. There is no pneumothorax or pleural effusion. The heart and pulmonary vascularity are normal. There is calcification of the wall of the aortic arch. There is multilevel degenerative disc disease of the thoracic spine. An acute fracture of the posterior aspect of the left eighth rib is well demonstrated. Fractures of the adjacent ribs are not clearly evident. There are multiple old rib fractures on the right. IMPRESSION: COPD. No pneumothorax nor evidence of pneumonia or pleural effusion. A posterior left eighth rib fracture is observed. The left seventh and ninth rib fractures are not clearly evident. Multiple old right-sided rib fractures appear stable. Thoracic aortic atherosclerosis. Electronically Signed   By: David  Martinique M.D.   On: 12/18/2015 15:06    ____________________________________________    PROCEDURES  Procedure(s) performed:    Procedures    Medications  oxyCODONE-acetaminophen (PERCOCET/ROXICET) 5-325 MG per tablet 1 tablet (1 tablet Oral Given 12/18/15 1611)     ____________________________________________   INITIAL IMPRESSION / ASSESSMENT AND PLAN / ED COURSE  Pertinent labs & imaging results that were available during my care of the patient were reviewed by me and considered in my  medical decision making (see chart for details).  Review of the [place] CSRS was performed in accordance of the [place] prior to dispensing any controlled drugs.  Clinical Course     Patient's diagnosis is consistent with delayed healing of left posterior eighth rib fracture. Patient has appointment with PCP on Friday and will follow up then. Patient will be discharged home with prescriptions for percocet for pain until Dr. appointment Friday. Patient is given ED precautions to return to the ED for any worsening or new symptoms. No evidence of PE, pneumonia, CHF, pneumothorax, pulmonary edema, Mi, or airway obstruction.      ____________________________________________  FINAL CLINICAL IMPRESSION(S) / ED DIAGNOSES  Final diagnoses:  Closed fracture of one rib of left side with delayed healing, subsequent encounter      NEW MEDICATIONS STARTED DURING THIS VISIT:  Discharge Medication List as of 12/18/2015  4:05 PM    START taking these medications   Details  oxyCODONE-acetaminophen (ROXICET) 5-325 MG tablet Take 1 tablet by mouth every 8 (eight) hours as needed., Starting Mon 12/18/2015, Until Tue 12/17/2016, Print            This chart was dictated using voice recognition software/Dragon. Despite best efforts  to proofread, errors can occur which can change the meaning. Any change was purely unintentional.   Laban Emperor, PA-C 12/18/15 [place], MD 12/19/15 2348

## 2015-12-18 NOTE — ED Notes (Signed)
Incentive spirometer reading 1750 and then 2000

## 2015-12-18 NOTE — ED Triage Notes (Signed)
Says he fx ribs few weeks ago and it is not better.  Says cannot sleep due to pain.

## 2015-12-27 DIAGNOSIS — J441 Chronic obstructive pulmonary disease with (acute) exacerbation: Secondary | ICD-10-CM | POA: Diagnosis not present

## 2016-01-09 DIAGNOSIS — J441 Chronic obstructive pulmonary disease with (acute) exacerbation: Secondary | ICD-10-CM | POA: Diagnosis not present

## 2016-01-09 DIAGNOSIS — R69 Illness, unspecified: Secondary | ICD-10-CM | POA: Diagnosis not present

## 2016-02-05 DIAGNOSIS — E785 Hyperlipidemia, unspecified: Secondary | ICD-10-CM | POA: Diagnosis not present

## 2016-02-05 DIAGNOSIS — K219 Gastro-esophageal reflux disease without esophagitis: Secondary | ICD-10-CM | POA: Diagnosis not present

## 2016-02-05 DIAGNOSIS — J441 Chronic obstructive pulmonary disease with (acute) exacerbation: Secondary | ICD-10-CM | POA: Diagnosis not present

## 2016-02-05 DIAGNOSIS — R69 Illness, unspecified: Secondary | ICD-10-CM | POA: Diagnosis not present

## 2016-02-05 DIAGNOSIS — I129 Hypertensive chronic kidney disease with stage 1 through stage 4 chronic kidney disease, or unspecified chronic kidney disease: Secondary | ICD-10-CM | POA: Diagnosis not present

## 2016-02-05 DIAGNOSIS — E039 Hypothyroidism, unspecified: Secondary | ICD-10-CM | POA: Diagnosis not present

## 2016-03-06 DIAGNOSIS — D0322 Melanoma in situ of left ear and external auricular canal: Secondary | ICD-10-CM | POA: Diagnosis not present

## 2016-03-06 DIAGNOSIS — C4322 Malignant melanoma of left ear and external auricular canal: Secondary | ICD-10-CM | POA: Insufficient documentation

## 2016-03-28 DIAGNOSIS — Z79899 Other long term (current) drug therapy: Secondary | ICD-10-CM | POA: Diagnosis not present

## 2016-03-28 DIAGNOSIS — J449 Chronic obstructive pulmonary disease, unspecified: Secondary | ICD-10-CM | POA: Diagnosis not present

## 2016-03-28 DIAGNOSIS — R5383 Other fatigue: Secondary | ICD-10-CM | POA: Diagnosis not present

## 2016-03-28 DIAGNOSIS — Z716 Tobacco abuse counseling: Secondary | ICD-10-CM | POA: Diagnosis not present

## 2016-03-28 DIAGNOSIS — E7800 Pure hypercholesterolemia, unspecified: Secondary | ICD-10-CM | POA: Diagnosis not present

## 2016-03-28 DIAGNOSIS — M1990 Unspecified osteoarthritis, unspecified site: Secondary | ICD-10-CM | POA: Diagnosis not present

## 2016-03-28 DIAGNOSIS — K219 Gastro-esophageal reflux disease without esophagitis: Secondary | ICD-10-CM | POA: Diagnosis not present

## 2016-03-28 DIAGNOSIS — I10 Essential (primary) hypertension: Secondary | ICD-10-CM | POA: Diagnosis not present

## 2016-03-28 DIAGNOSIS — E569 Vitamin deficiency, unspecified: Secondary | ICD-10-CM | POA: Diagnosis not present

## 2016-03-28 DIAGNOSIS — R69 Illness, unspecified: Secondary | ICD-10-CM | POA: Diagnosis not present

## 2016-03-28 DIAGNOSIS — E559 Vitamin D deficiency, unspecified: Secondary | ICD-10-CM | POA: Diagnosis not present

## 2016-04-19 DIAGNOSIS — I10 Essential (primary) hypertension: Secondary | ICD-10-CM | POA: Diagnosis not present

## 2016-04-19 DIAGNOSIS — M545 Low back pain: Secondary | ICD-10-CM | POA: Diagnosis not present

## 2016-04-19 DIAGNOSIS — N19 Unspecified kidney failure: Secondary | ICD-10-CM | POA: Diagnosis not present

## 2016-04-19 DIAGNOSIS — Z79899 Other long term (current) drug therapy: Secondary | ICD-10-CM | POA: Diagnosis not present

## 2016-04-19 DIAGNOSIS — N183 Chronic kidney disease, stage 3 (moderate): Secondary | ICD-10-CM | POA: Diagnosis not present

## 2016-05-02 DIAGNOSIS — Z720 Tobacco use: Secondary | ICD-10-CM | POA: Diagnosis not present

## 2016-05-02 DIAGNOSIS — J438 Other emphysema: Secondary | ICD-10-CM | POA: Diagnosis not present

## 2016-05-20 ENCOUNTER — Emergency Department
Admission: EM | Admit: 2016-05-20 | Discharge: 2016-05-20 | Discharge status: Absent without leave | Payer: Medicare Other

## 2016-05-20 NOTE — ED Notes (Signed)
No answer when called from lobby several times 

## 2016-05-21 DIAGNOSIS — R69 Illness, unspecified: Secondary | ICD-10-CM | POA: Diagnosis not present

## 2016-05-21 DIAGNOSIS — J438 Other emphysema: Secondary | ICD-10-CM | POA: Diagnosis not present

## 2016-05-21 DIAGNOSIS — N3000 Acute cystitis without hematuria: Secondary | ICD-10-CM | POA: Diagnosis not present

## 2016-05-21 DIAGNOSIS — E440 Moderate protein-calorie malnutrition: Secondary | ICD-10-CM | POA: Diagnosis not present

## 2016-05-31 DIAGNOSIS — M545 Low back pain: Secondary | ICD-10-CM | POA: Diagnosis not present

## 2016-05-31 DIAGNOSIS — D649 Anemia, unspecified: Secondary | ICD-10-CM | POA: Diagnosis not present

## 2016-05-31 DIAGNOSIS — E7800 Pure hypercholesterolemia, unspecified: Secondary | ICD-10-CM | POA: Diagnosis not present

## 2016-05-31 DIAGNOSIS — E538 Deficiency of other specified B group vitamins: Secondary | ICD-10-CM | POA: Diagnosis not present

## 2016-05-31 DIAGNOSIS — N19 Unspecified kidney failure: Secondary | ICD-10-CM | POA: Diagnosis not present

## 2016-05-31 DIAGNOSIS — N189 Chronic kidney disease, unspecified: Secondary | ICD-10-CM | POA: Diagnosis not present

## 2016-05-31 DIAGNOSIS — R5383 Other fatigue: Secondary | ICD-10-CM | POA: Diagnosis not present

## 2016-05-31 DIAGNOSIS — Z79899 Other long term (current) drug therapy: Secondary | ICD-10-CM | POA: Diagnosis not present

## 2016-05-31 DIAGNOSIS — E559 Vitamin D deficiency, unspecified: Secondary | ICD-10-CM | POA: Diagnosis not present

## 2016-05-31 DIAGNOSIS — F17200 Nicotine dependence, unspecified, uncomplicated: Secondary | ICD-10-CM | POA: Diagnosis not present

## 2016-05-31 DIAGNOSIS — R69 Illness, unspecified: Secondary | ICD-10-CM | POA: Diagnosis not present

## 2016-07-01 ENCOUNTER — Ambulatory Visit (INDEPENDENT_AMBULATORY_CARE_PROVIDER_SITE_OTHER): Payer: Medicare HMO | Admitting: Family Medicine

## 2016-07-01 ENCOUNTER — Encounter: Payer: Self-pay | Admitting: Family Medicine

## 2016-07-01 VITALS — BP 138/66 | HR 85 | Temp 98.0°F | Ht 67.0 in | Wt 140.2 lb

## 2016-07-01 DIAGNOSIS — Z720 Tobacco use: Secondary | ICD-10-CM | POA: Diagnosis not present

## 2016-07-01 DIAGNOSIS — I10 Essential (primary) hypertension: Secondary | ICD-10-CM | POA: Diagnosis not present

## 2016-07-01 DIAGNOSIS — Z85820 Personal history of malignant melanoma of skin: Secondary | ICD-10-CM | POA: Insufficient documentation

## 2016-07-01 DIAGNOSIS — Z862 Personal history of diseases of the blood and blood-forming organs and certain disorders involving the immune mechanism: Secondary | ICD-10-CM | POA: Diagnosis not present

## 2016-07-01 DIAGNOSIS — H9193 Unspecified hearing loss, bilateral: Secondary | ICD-10-CM

## 2016-07-01 DIAGNOSIS — H9190 Unspecified hearing loss, unspecified ear: Secondary | ICD-10-CM | POA: Insufficient documentation

## 2016-07-01 DIAGNOSIS — M150 Primary generalized (osteo)arthritis: Secondary | ICD-10-CM | POA: Diagnosis not present

## 2016-07-01 DIAGNOSIS — M1990 Unspecified osteoarthritis, unspecified site: Secondary | ICD-10-CM | POA: Insufficient documentation

## 2016-07-01 DIAGNOSIS — M159 Polyosteoarthritis, unspecified: Secondary | ICD-10-CM

## 2016-07-01 DIAGNOSIS — M8949 Other hypertrophic osteoarthropathy, multiple sites: Secondary | ICD-10-CM

## 2016-07-01 LAB — COMPREHENSIVE METABOLIC PANEL
ALT: 14 U/L (ref 0–53)
AST: 21 U/L (ref 0–37)
Albumin: 4.2 g/dL (ref 3.5–5.2)
Alkaline Phosphatase: 61 U/L (ref 39–117)
BUN: 24 mg/dL — ABNORMAL HIGH (ref 6–23)
CALCIUM: 9.6 mg/dL (ref 8.4–10.5)
CHLORIDE: 105 meq/L (ref 96–112)
CO2: 26 meq/L (ref 19–32)
Creatinine, Ser: 1.75 mg/dL — ABNORMAL HIGH (ref 0.40–1.50)
GFR: 40.02 mL/min — AB (ref >60.00)
Glucose, Bld: 113 mg/dL — ABNORMAL HIGH (ref 70–99)
Potassium: 4.6 mEq/L (ref 3.5–5.1)
Sodium: 136 mEq/L (ref 135–145)
Total Bilirubin: 0.3 mg/dL (ref 0.2–1.2)
Total Protein: 7.1 g/dL (ref 6.0–8.3)

## 2016-07-01 LAB — LIPID PANEL
CHOL/HDL RATIO: 3
Cholesterol: 146 mg/dL (ref 0–200)
HDL: 51.6 mg/dL (ref >39.00)
LDL CALC: 82 mg/dL (ref 0–99)
NonHDL: 94.87
TRIGLYCERIDES: 65 mg/dL (ref 0.0–149.0)
VLDL: 13 mg/dL (ref 0.0–40.0)

## 2016-07-01 LAB — CBC
HCT: 31.6 % — ABNORMAL LOW (ref 39.0–52.0)
HEMOGLOBIN: 10.5 g/dL — AB (ref 13.0–17.0)
MCHC: 33.2 g/dL (ref 30.0–36.0)
MCV: 86.8 fl (ref 78.0–100.0)
PLATELETS: 392 10*3/uL (ref 150.0–400.0)
RBC: 3.64 Mil/uL — ABNORMAL LOW (ref 4.22–5.81)
RDW: 15.9 % — ABNORMAL HIGH (ref 11.5–15.5)
WBC: 7.8 10*3/uL (ref 4.0–10.5)

## 2016-07-01 MED ORDER — AMLODIPINE BESYLATE 5 MG PO TABS: 5.0000 mg | ORAL_TABLET | Freq: Every day | ORAL | 3 refills | Status: DC

## 2016-07-01 MED ORDER — TRAMADOL HCL 50 MG PO TABS: 50.0000 mg | ORAL_TABLET | Freq: Three times a day (TID) | ORAL | 1 refills | Status: DC

## 2016-07-01 NOTE — Patient Instructions (Signed)
We will set up for you to get a hearing evaluation/hearing aid.  We will call with your results.  Follow up in 6 months.  Take care  Dr. Lacinda Axon

## 2016-07-01 NOTE — Progress Notes (Signed)
Subjective:  Patient ID: Justin Andrews, male    DOB: 01-Aug-1934  Age: 82 y.o. MRN: [id number]  CC: Establish care  HPI Justin Andrews is a 82 y.o. male presents to the clinic today to establish care. Issues are below.  HTN  At goal on Amlodipine. Will need refill soon.  OA  Patient has diffuse osteoarthritis.  He is bothered most by his hands as well as his knees and ankles.  He takes tramadol as needed for pain.  He is in need of refill.  Tobacco abuse  Smokes one pack a day.  Not interested in cessation.  Hearing loss  Patient is very little out of his left ear. Has decreased hearing in the right ear.  He is interested in hearing aids.  PMH, Surgical Hx, Family Hx, Social History reviewed and updated as below.  Past Medical History:  Diagnosis Date  . History of melanoma   . Hypertension   . Osteoarthritis   . Tobacco abuse    Past Surgical History:  Procedure Laterality Date  . ANKLE FRACTURE SURGERY    . EXTERNAL EAR SURGERY    . ROTATOR CUFF REPAIR    . TONSILLECTOMY    . TYMPANOPLASTY W/ MASTOIDECTOMY     Patient with reports of mastoid surgery (appears to have had surgery on TM; Left).   Family History  Problem Relation Age of Onset  . Family history unknown: Yes   Social History  Substance Use Topics  . Smoking status: Current Every Day Smoker    Types: Cigarettes  . Smokeless tobacco: Never Used  . Alcohol use No   Review of Systems  HENT: Positive for hearing loss.   Genitourinary: Positive for frequency.  All other systems reviewed and are negative.  Objective:   Today's Vitals: BP 138/66 (BP Location: Left Arm, Patient Position: Sitting, Cuff Size: Normal)   Pulse 85   Temp 98 F (36.7 C) (Oral)   Ht 5\' 7"  (1.702 m)   Wt 140 lb 4 oz (63.6 kg)   SpO2 98%   BMI 21.97 kg/m   Physical Exam  Constitutional: He is oriented to person, place, and time. He appears well-developed and well-nourished. No distress.  HENT:  Head:  Normocephalic and atraumatic.  Mouth/Throat: Oropharynx is clear and moist.  Right TM normal. Left TM abnormal from prior surgeries. Portion of Left ear absent.  Eyes: Conjunctivae are normal. No scleral icterus.  Neck: Neck supple.  Cardiovascular: Normal rate and regular rhythm.   Pulmonary/Chest: Effort normal. He has no wheezes. He has no rales.  Abdominal: Soft. He exhibits no distension. There is no tenderness. There is no rebound and no guarding.  Musculoskeletal:  Hands with swelling/nodes - DIP and PIP joints.  Lymphadenopathy:    He has no cervical adenopathy.  Neurological: He is alert and oriented to person, place, and time.  Skin: Skin is warm. No rash noted.  Psychiatric: He has a normal mood and affect.  Vitals reviewed.  Assessment & Plan:   Problem List Items Addressed This Visit      Cardiovascular and Mediastinum   Hypertension - Primary    At goal. Continue amlodipine. Refilled today.      Relevant Medications   amLODipine (NORVASC) 5 MG tablet   Other Relevant Orders   Comprehensive metabolic panel   Lipid panel     Nervous and Auditory   Hearing loss    New problem. Sending to ENT.  Musculoskeletal and Integument   Osteoarthritis    Has been stable on tramadol. Continue. Refilled today.      Relevant Medications   traMADol (ULTRAM) 50 MG tablet     Other   Tobacco abuse    Patient not interested in quitting.       Other Visit Diagnoses    History of anemia       Relevant Orders   CBC      Meds ordered this encounter  Medications  . DISCONTD: amLODipine (NORVASC) 5 MG tablet    Sig: Take 5 mg by mouth daily.    Refill:  2  . DISCONTD: traMADol (ULTRAM) 50 MG tablet    Sig: 1-2 Tablets PO every 8hrs PRN  . potassium gluconate 595 (99 K) MG TABS tablet    Sig: Take 595 mg by mouth daily.  . traMADol (ULTRAM) 50 MG tablet    Sig: Take 1-2 tablets (50-100 mg total) by mouth every 8 (eight) hours.    Dispense:  90 tablet     Refill:  1  . amLODipine (NORVASC) 5 MG tablet    Sig: Take 1 tablet (5 mg total) by mouth daily.    Dispense:  90 tablet    Refill:  3   Follow-up: 6 months  [place] DO [facility]

## 2016-07-01 NOTE — Assessment & Plan Note (Signed)
At goal. Continue amlodipine. Refilled today.

## 2016-07-01 NOTE — Assessment & Plan Note (Signed)
Patient not interested in quitting.

## 2016-07-01 NOTE — Assessment & Plan Note (Signed)
New problem. Sending to ENT.

## 2016-07-01 NOTE — Assessment & Plan Note (Signed)
Has been stable on tramadol. Continue. Refilled today.

## 2016-07-11 ENCOUNTER — Telehealth: Payer: Self-pay

## 2016-07-11 NOTE — Telephone Encounter (Signed)
Patient states feet and ankles having swelling for 3 days  Blood pressure 177/89 pulse rate 102.  Patient states he is not resting when checking blood pressure just checking after sitting for 2 minutes.  Patient educated on how to check pressure properly.   Advised patient if he continues to have swelling or if swelling worsens , shortness of breath  If blood pressure elevates to call us back for sooner appointment or to go to urgent care.   Patient verbalized understanding.     Appointment scheduled for 07/19/16

## 2016-07-23 ENCOUNTER — Ambulatory Visit (INDEPENDENT_AMBULATORY_CARE_PROVIDER_SITE_OTHER): Payer: Medicare HMO | Admitting: Family Medicine

## 2016-07-23 ENCOUNTER — Encounter: Payer: Self-pay | Admitting: Family Medicine

## 2016-07-23 VITALS — BP 150/68 | HR 108 | Temp 98.5°F | Wt 134.0 lb

## 2016-07-23 DIAGNOSIS — D649 Anemia, unspecified: Secondary | ICD-10-CM | POA: Diagnosis not present

## 2016-07-23 DIAGNOSIS — R600 Localized edema: Secondary | ICD-10-CM | POA: Diagnosis not present

## 2016-07-23 DIAGNOSIS — R739 Hyperglycemia, unspecified: Secondary | ICD-10-CM

## 2016-07-23 DIAGNOSIS — G479 Sleep disorder, unspecified: Secondary | ICD-10-CM | POA: Diagnosis not present

## 2016-07-23 DIAGNOSIS — I10 Essential (primary) hypertension: Secondary | ICD-10-CM | POA: Diagnosis not present

## 2016-07-23 LAB — VITAMIN B12: VITAMIN B 12: 197 pg/mL — AB (ref 211–911)

## 2016-07-23 LAB — HEMOGLOBIN A1C: Hgb A1c MFr Bld: 5.7 % (ref 4.6–6.5)

## 2016-07-23 LAB — FOLATE: Folate: 4.6 ng/mL — ABNORMAL LOW (ref >5.9)

## 2016-07-23 MED ORDER — CARVEDILOL 6.25 MG PO TABS: 6.2500 mg | ORAL_TABLET | Freq: Two times a day (BID) | ORAL | 3 refills | Status: DC

## 2016-07-23 MED ORDER — QUETIAPINE FUMARATE 50 MG PO TABS: 50.0000 mg | ORAL_TABLET | Freq: Every day | ORAL | 1 refills | Status: DC

## 2016-07-23 MED ORDER — TRAMADOL HCL 50 MG PO TABS: 50.0000 mg | ORAL_TABLET | Freq: Three times a day (TID) | ORAL | 1 refills | Status: DC

## 2016-07-23 NOTE — Progress Notes (Signed)
Subjective:  Patient ID: Justin Andrews, male    DOB: 12/13/1934  Age: 82 y.o. MRN: [id number]  CC: Ankle swelling, elevated BP, Sleep disturbance  HPI:  82 year old male with hypertension, osteoarthritis, tobacco abuse presents with the above complaints.  Patient developed feet and ankle swelling 2 weeks ago. His blood pressure has been elevated. He called the office and informed us and was scheduled for an appointment for evaluation. His edema has now resolved. He continues to be on amlodipine for his high blood pressure which is likely the concerning factor.  BPs continue to be elevated. He endorses compliance with amlodipine and lisinopril. No reports of chest pain or shortness of breath.  Additionally, patient reports that he has had recent sleep disturbance. He states that he has had difficulty falling asleep. He states that he did not sleep for 6 nights. He states that at night he was found to be sleepwalking. He has fallen down during his sleep walking and has been confused. No reported recent fall.  Social Hx   Social History   Social History  . Marital status: Divorced    Spouse name: N/A  . Number of children: N/A  . Years of education: N/A   Social History Main Topics  . Smoking status: Current Every Day Smoker    Types: Cigarettes  . Smokeless tobacco: Never Used  . Alcohol use No  . Drug use: No  . Sexual activity: No   Other Topics Concern  . None   Social History Narrative  . None    Review of Systems  Constitutional: Negative.   Cardiovascular: Positive for leg swelling.  Psychiatric/Behavioral: Positive for sleep disturbance.   Objective:  BP (!) 150/68   Pulse (!) 108   Temp 98.5 F (36.9 C) (Oral)   Wt 134 lb (60.8 kg)   SpO2 98%   BMI 20.99 kg/m   BP/Weight 07/23/2016 07/01/2016 13/24/4010  Systolic BP 272 536 644  Diastolic BP 68 66 87  Wt. (Lbs) 134 140.25 155  BMI 20.99 21.97 22.89   Physical Exam  Constitutional: He appears  well-developed. No distress.  Cardiovascular: Regular rhythm.   Tachycardic. No murmur.  Pulmonary/Chest: Effort normal. He has no wheezes. He has no rales.  Neurological: He is alert.  No focal deficits.  Psychiatric: He has a normal mood and affect.  Vitals reviewed.   Lab Results  Component Value Date   WBC 7.8 07/01/2016   HGB 10.5 (L) 07/01/2016   HCT 31.6 (L) 07/01/2016   PLT 392.0 07/01/2016   GLUCOSE 113 (H) 07/01/2016   CHOL 146 07/01/2016   TRIG 65.0 07/01/2016   HDL 51.60 07/01/2016   LDLCALC 82 07/01/2016   ALT 14 07/01/2016   AST 21 07/01/2016   NA 136 07/01/2016   K 4.6 07/01/2016   CL 105 07/01/2016   CREATININE 1.75 (H) 07/01/2016   BUN 24 (H) 07/01/2016   CO2 26 07/01/2016    Assessment & Plan:   Problem List Items Addressed This Visit      Cardiovascular and Mediastinum   Hypertension    Stopping Norvasc. Adding Coreg.      Relevant Medications   lisinopril (PRINIVIL,ZESTRIL) 20 MG tablet   carvedilol (COREG) 6.25 MG tablet     Other   Lower extremity edema - Primary    New problem. Likely secondary to Norvasc. Stopping today.      Sleep disturbance    New problem. Question was sleepwalking versus wandering/confusion in the setting  of possible early dementia. Trial of Seroquel.       Other Visit Diagnoses    Normocytic anemia       Relevant Orders   Iron, TIBC and Ferritin Panel   Vitamin B12   Folate   Blood glucose elevated       Relevant Orders   Hemoglobin A1c      Meds ordered this encounter  Medications  . lisinopril (PRINIVIL,ZESTRIL) 20 MG tablet    Sig: Take 20 mg by mouth daily.    Refill:  1  . ranitidine (ZANTAC) 150 MG tablet    Sig: Take 150 mg by mouth 2 (two) times daily.  . carvedilol (COREG) 6.25 MG tablet    Sig: Take 1 tablet (6.25 mg total) by mouth 2 (two) times daily with a meal.    Dispense:  60 tablet    Refill:  3  . traMADol (ULTRAM) 50 MG tablet    Sig: Take 1 tablet (50 mg total) by mouth  every 8 (eight) hours.    Dispense:  90 tablet    Refill:  1  . QUEtiapine (SEROQUEL) 50 MG tablet    Sig: Take 1 tablet (50 mg total) by mouth at bedtime.    Dispense:  90 tablet    Refill:  1     Follow-up: Return in about 2 weeks (around 08/06/2016).  [place]

## 2016-07-23 NOTE — Assessment & Plan Note (Signed)
New problem. Question was sleepwalking versus wandering/confusion in the setting of possible early dementia. Trial of Seroquel.

## 2016-07-23 NOTE — Patient Instructions (Signed)
Take the medication as prescribed.  Follow up in 2 weeks.  Take care  Dr. Jereld Presti  

## 2016-07-23 NOTE — Assessment & Plan Note (Signed)
New problem. Likely secondary to Norvasc. Stopping today.

## 2016-07-23 NOTE — Assessment & Plan Note (Signed)
Stopping Norvasc. Adding Coreg.

## 2016-07-24 ENCOUNTER — Telehealth: Payer: Self-pay | Admitting: Radiology

## 2016-07-24 LAB — IRON,TIBC AND FERRITIN PANEL
%SAT: 14 % — ABNORMAL LOW (ref 15–60)
FERRITIN: 22 ng/mL (ref 20–380)
IRON: 47 ug/dL — AB (ref 50–180)
TIBC: 335 ug/dL (ref 250–425)

## 2016-07-24 NOTE — Telephone Encounter (Signed)
Pt coming in for labs tomorrow, please place future orders. Thank you.  

## 2016-07-24 NOTE — Telephone Encounter (Signed)
Lab appt canceled, left message to notify pt that lab appt was canceled for tomorrow.

## 2016-07-24 NOTE — Telephone Encounter (Signed)
Had labs yesterday. Cancel.

## 2016-07-25 ENCOUNTER — Other Ambulatory Visit: Payer: Medicare HMO

## 2016-07-25 ENCOUNTER — Other Ambulatory Visit: Payer: Self-pay | Admitting: Family Medicine

## 2016-07-25 MED ORDER — FERROUS SULFATE 325 (65 FE) MG PO TABS: 325.0000 mg | ORAL_TABLET | Freq: Two times a day (BID) | ORAL | 1 refills | Status: DC

## 2016-07-25 MED ORDER — FOLIC ACID 1 MG PO TABS: 1.0000 mg | ORAL_TABLET | Freq: Every day | ORAL | 1 refills | Status: DC

## 2016-07-25 MED ORDER — B-12 1000 MCG PO TABS: 1.0000 | ORAL_TABLET | Freq: Every day | ORAL | 1 refills | Status: DC

## 2016-08-05 ENCOUNTER — Encounter: Payer: Self-pay | Admitting: Family Medicine

## 2016-08-05 ENCOUNTER — Ambulatory Visit (INDEPENDENT_AMBULATORY_CARE_PROVIDER_SITE_OTHER): Payer: Medicare HMO | Admitting: Family Medicine

## 2016-08-05 DIAGNOSIS — M150 Primary generalized (osteo)arthritis: Secondary | ICD-10-CM | POA: Diagnosis not present

## 2016-08-05 DIAGNOSIS — M159 Polyosteoarthritis, unspecified: Secondary | ICD-10-CM

## 2016-08-05 DIAGNOSIS — F329 Major depressive disorder, single episode, unspecified: Secondary | ICD-10-CM | POA: Diagnosis not present

## 2016-08-05 DIAGNOSIS — N183 Chronic kidney disease, stage 3 unspecified: Secondary | ICD-10-CM | POA: Insufficient documentation

## 2016-08-05 DIAGNOSIS — D649 Anemia, unspecified: Secondary | ICD-10-CM | POA: Insufficient documentation

## 2016-08-05 DIAGNOSIS — R634 Abnormal weight loss: Secondary | ICD-10-CM | POA: Diagnosis not present

## 2016-08-05 DIAGNOSIS — G479 Sleep disorder, unspecified: Secondary | ICD-10-CM

## 2016-08-05 DIAGNOSIS — F32A Depression, unspecified: Secondary | ICD-10-CM

## 2016-08-05 DIAGNOSIS — F419 Anxiety disorder, unspecified: Secondary | ICD-10-CM

## 2016-08-05 DIAGNOSIS — M8949 Other hypertrophic osteoarthropathy, multiple sites: Secondary | ICD-10-CM

## 2016-08-05 DIAGNOSIS — R69 Illness, unspecified: Secondary | ICD-10-CM | POA: Diagnosis not present

## 2016-08-05 MED ORDER — TRAMADOL HCL 50 MG PO TABS: 100.0000 mg | ORAL_TABLET | Freq: Three times a day (TID) | ORAL | 2 refills | Status: DC

## 2016-08-05 MED ORDER — SERTRALINE HCL 25 MG PO TABS: 25.0000 mg | ORAL_TABLET | Freq: Every day | ORAL | 3 refills | Status: DC

## 2016-08-05 MED ORDER — TRAZODONE HCL 50 MG PO TABS: 50.0000 mg | ORAL_TABLET | Freq: Every day | ORAL | 3 refills | Status: DC

## 2016-08-05 NOTE — Assessment & Plan Note (Signed)
Trial of Trazodone. °

## 2016-08-05 NOTE — Assessment & Plan Note (Signed)
Weight loss noted upon review of vital signs. Patient endorsing poor appetite. He states that he eats very little. He is a current smoker. Needs lung cancer screening. We'll continue to follow.

## 2016-08-05 NOTE — Patient Instructions (Signed)
Medications as prescribed.  Follow up in 1 month.  Take care  Dr. Normon Pettijohn  

## 2016-08-05 NOTE — Assessment & Plan Note (Signed)
Uncontrolled. Increasing tramadol to 100 mg Q8.

## 2016-08-05 NOTE — Progress Notes (Signed)
Subjective:  Patient ID: Justin Andrews, male    DOB: 05-15-1934  Age: 82 y.o. MRN: [id number]  CC: Joint pain/OA, sleep disturbance  HPI:  82 year old male with hypertension, CKD stage III, anemia (iron deficient, B12 deficient, and folate deficient), tobacco abuse presents with the above complaints.  OA  Pain is uncontrolled. He's requesting an increase in his tramadol to 2 tablets every 8 hours.  Insomnia/sleep disturbance  Patient continues to have difficulty sleeping. He states that he's sleeping very little.  He sleepwalks as well.  He was unable to get the Seroquel as it was too expensive.  He would like to discuss something for sleep today.  Depression/Anxiety  PHQ9 of 18 today.  Patient states that he is quite anxious and has difficulty cutting his mind off.  Will discuss today.  Social Hx   Social History   Social History  . Marital status: Divorced    Spouse name: N/A  . Number of children: N/A  . Years of education: N/A   Social History Main Topics  . Smoking status: Current Every Day Smoker    Types: Cigarettes  . Smokeless tobacco: Never Used  . Alcohol use No  . Drug use: No  . Sexual activity: No   Other Topics Concern  . Not on file   Social History Narrative  . No narrative on file    Review of Systems  Constitutional: Positive for unexpected weight change.  Musculoskeletal: Positive for arthralgias.  Psychiatric/Behavioral: Positive for sleep disturbance. The patient is nervous/anxious.    Objective:  BP (!) 168/98 (BP Location: Left Arm, Patient Position: Sitting, Cuff Size: Normal)   Pulse 83   Wt 129 lb 4 oz (58.6 kg)   SpO2 98%   BMI 20.24 kg/m   BP/Weight 08/05/2016 07/23/2016 06/26/5091  Systolic BP 267 124 580  Diastolic BP 98 68 66  Wt. (Lbs) 129.25 134 140.25  BMI 20.24 20.99 21.97    Physical Exam  Constitutional:  Thin elderly male in NAD.   HENT:  Head: Normocephalic and atraumatic.  Eyes: Conjunctivae are  normal. No scleral icterus.  Cardiovascular: Normal rate and regular rhythm.   Pulmonary/Chest: Effort normal and breath sounds normal. He has no wheezes. He has no rales.  Neurological: He is alert.  Psychiatric: He has a normal mood and affect.  Vitals reviewed.   Lab Results  Component Value Date   WBC 7.8 07/01/2016   HGB 10.5 (L) 07/01/2016   HCT 31.6 (L) 07/01/2016   PLT 392.0 07/01/2016   GLUCOSE 113 (H) 07/01/2016   CHOL 146 07/01/2016   TRIG 65.0 07/01/2016   HDL 51.60 07/01/2016   LDLCALC 82 07/01/2016   ALT 14 07/01/2016   AST 21 07/01/2016   NA 136 07/01/2016   K 4.6 07/01/2016   CL 105 07/01/2016   CREATININE 1.75 (H) 07/01/2016   BUN 24 (H) 07/01/2016   CO2 26 07/01/2016   HGBA1C 5.7 07/23/2016    Assessment & Plan:   Problem List Items Addressed This Visit      Musculoskeletal and Integument   Osteoarthritis    Uncontrolled. Increasing tramadol to 100 mg Q8.      Relevant Medications   traMADol (ULTRAM) 50 MG tablet     Other   Anxiety and depression    Starting Zoloft. Follow-up in one month.      Sleep disturbance    Trial of Trazodone.      Weight loss    Weight loss  noted upon review of vital signs. Patient endorsing poor appetite. He states that he eats very little. He is a current smoker. Needs lung cancer screening. We'll continue to follow.         Meds ordered this encounter  Medications  . sertraline (ZOLOFT) 25 MG tablet    Sig: Take 1 tablet (25 mg total) by mouth daily.    Dispense:  90 tablet    Refill:  3  . traZODone (DESYREL) 50 MG tablet    Sig: Take 1 tablet (50 mg total) by mouth at bedtime.    Dispense:  90 tablet    Refill:  3  . traMADol (ULTRAM) 50 MG tablet    Sig: Take 2 tablets (100 mg total) by mouth every 8 (eight) hours.    Dispense:  180 tablet    Refill:  2     Follow-up: 1 month  [place]

## 2016-08-05 NOTE — Assessment & Plan Note (Signed)
Starting Zoloft. Follow-up in one month.

## 2016-08-07 ENCOUNTER — Ambulatory Visit: Payer: Medicare HMO | Admitting: Family Medicine

## 2016-09-12 ENCOUNTER — Ambulatory Visit (INDEPENDENT_AMBULATORY_CARE_PROVIDER_SITE_OTHER): Payer: Medicare HMO | Admitting: Family Medicine

## 2016-09-12 ENCOUNTER — Encounter: Payer: Self-pay | Admitting: Family Medicine

## 2016-09-12 VITALS — BP 158/78 | HR 72 | Temp 98.5°F | Resp 12 | Wt 131.4 lb

## 2016-09-12 DIAGNOSIS — F419 Anxiety disorder, unspecified: Secondary | ICD-10-CM

## 2016-09-12 DIAGNOSIS — F32A Depression, unspecified: Secondary | ICD-10-CM

## 2016-09-12 DIAGNOSIS — D649 Anemia, unspecified: Secondary | ICD-10-CM | POA: Diagnosis not present

## 2016-09-12 DIAGNOSIS — I10 Essential (primary) hypertension: Secondary | ICD-10-CM

## 2016-09-12 DIAGNOSIS — R69 Illness, unspecified: Secondary | ICD-10-CM | POA: Diagnosis not present

## 2016-09-12 DIAGNOSIS — F329 Major depressive disorder, single episode, unspecified: Secondary | ICD-10-CM

## 2016-09-12 DIAGNOSIS — Z23 Encounter for immunization: Secondary | ICD-10-CM

## 2016-09-12 LAB — CBC
HEMATOCRIT: 35.5 % — AB (ref 39.0–52.0)
HEMOGLOBIN: 11.4 g/dL — AB (ref 13.0–17.0)
MCHC: 32.2 g/dL (ref 30.0–36.0)
MCV: 94.7 fl (ref 78.0–100.0)
PLATELETS: 299 10*3/uL (ref 150.0–400.0)
RBC: 3.75 Mil/uL — ABNORMAL LOW (ref 4.22–5.81)
RDW: 17.9 % — AB (ref 11.5–15.5)
WBC: 7.3 10*3/uL (ref 4.0–10.5)

## 2016-09-12 MED ORDER — SERTRALINE HCL 50 MG PO TABS: 50.0000 mg | ORAL_TABLET | Freq: Every day | ORAL | 1 refills | Status: DC

## 2016-09-12 NOTE — Assessment & Plan Note (Signed)
Rechecking with CBC today.

## 2016-09-12 NOTE — Progress Notes (Signed)
Subjective:  Patient ID: Justin Andrews, male    DOB: 01/27/1934  Age: 82 y.o. MRN: [id number]  CC: Follow up  HPI:  82 year old male with hypertension, osteoarthritis, tobacco abuse, sleep disturbance, CKD, anxiety/depression, anemia presents for follow-up.  Anxiety/depression  Struggling with depression mostly (at this time). Decreased drive/motivation, feeling worthless, feeling down.  Has not had much improvement with Zoloft 25 mg daily. Endorses compliance.  Hypertension  Fair control.  Compliance may be an issue. He did not bring his lisinopril with him today. I'm unsure why.  He is currently taking carvedilol, lisinopril and has recently been taking his amlodipine although he was instructed to stop previously (due to LE edema).  Anemia  Found to be iron, folate, and B12 deficient. Currently on oral supplementation. Needs recheck today.  Social Hx   Social History   Social History  . Marital status: Divorced    Spouse name: N/A  . Number of children: N/A  . Years of education: N/A   Social History Main Topics  . Smoking status: Current Every Day Smoker    Types: Cigarettes  . Smokeless tobacco: Never Used  . Alcohol use No  . Drug use: No  . Sexual activity: No   Other Topics Concern  . None   Social History Narrative  . None    Review of Systems  Constitutional: Negative.   Psychiatric/Behavioral: Positive for sleep disturbance.       Depression.   Objective:  BP (!) 158/78 (BP Location: Left Arm, Patient Position: Sitting, Cuff Size: Normal)   Pulse 72   Temp 98.5 F (36.9 C) (Oral)   Resp 12   Wt 131 lb 6 oz (59.6 kg)   SpO2 96%   BMI 20.58 kg/m   BP/Weight 09/12/2016 08/05/2016 7/41/6384  Systolic BP 536 468 032  Diastolic BP 78 98 68  Wt. (Lbs) 131.38 129.25 134  BMI 20.58 20.24 20.99    Physical Exam  Constitutional: He is oriented to person, place, and time. He appears well-developed. No distress.  Cardiovascular: Normal rate and  regular rhythm.   No murmur heard. Pulmonary/Chest: Effort normal. He has no wheezes. He has no rales.  Neurological: He is alert and oriented to person, place, and time.  Psychiatric: He has a normal mood and affect.  Vitals reviewed.   Lab Results  Component Value Date   WBC 7.8 07/01/2016   HGB 10.5 (L) 07/01/2016   HCT 31.6 (L) 07/01/2016   PLT 392.0 07/01/2016   GLUCOSE 113 (H) 07/01/2016   CHOL 146 07/01/2016   TRIG 65.0 07/01/2016   HDL 51.60 07/01/2016   LDLCALC 82 07/01/2016   ALT 14 07/01/2016   AST 21 07/01/2016   NA 136 07/01/2016   K 4.6 07/01/2016   CL 105 07/01/2016   CREATININE 1.75 (H) 07/01/2016   BUN 24 (H) 07/01/2016   CO2 26 07/01/2016   HGBA1C 5.7 07/23/2016    Assessment & Plan:   Problem List Items Addressed This Visit    Hypertension    Advised to stop norvasc. Advised compliance lisinopril and Coreg.       Anemia    Rechecking with CBC today.       Relevant Orders   CBC   Anxiety and depression - Primary    Uncontrolled. Increasing zoloft.        Other Visit Diagnoses    Encounter for immunization       Relevant Orders   Flu vaccine HIGH DOSE PF (  Completed)      Meds ordered this encounter  Medications  . DISCONTD: amLODipine (NORVASC) 5 MG tablet    Sig: Take 5 mg by mouth daily.    Refill:  3  . sertraline (ZOLOFT) 50 MG tablet    Sig: Take 1 tablet (50 mg total) by mouth daily.    Dispense:  90 tablet    Refill:  1   Follow-up: 3 months  Pocahontas DO [facility]

## 2016-09-12 NOTE — Assessment & Plan Note (Signed)
Uncontrolled. Increasing zoloft.

## 2016-09-12 NOTE — Patient Instructions (Addendum)
Be sure to take your medications.  I have increased the Zoloft.  Follow up in 3 months.  Take care  Dr. Lacinda Axon

## 2016-09-12 NOTE — Assessment & Plan Note (Signed)
Advised to stop norvasc. Advised compliance lisinopril and Coreg.

## 2016-10-15 ENCOUNTER — Telehealth: Payer: Self-pay | Admitting: *Deleted

## 2016-10-15 ENCOUNTER — Telehealth: Payer: Self-pay | Admitting: Family Medicine

## 2016-10-15 NOTE — Telephone Encounter (Signed)
You can offer him an appointment to be worked in tomorrow morning. I do not have any openings, though he could be placed on the schedule at 9:30 and I will hopefully be able to see him around that time. He may have to wait to be seen. Thanks.

## 2016-10-15 NOTE — Telephone Encounter (Signed)
Patient Name: Justin Andrews  DOB: 09-29-1934    Initial Comment Patient having trouble with urination, painful, going every 10 minutes    Nurse Assessment  Nurse: Leilani Merl, RN, Heather Date/Time (Eastern Time): 10/15/2016 11:13:21 AM  Confirm and document reason for call. If symptomatic, describe symptoms. ---Patient having trouble with urination, painful, going every 10 minutes. It started on Saturday night, it is better today.  Does the patient have any new or worsening symptoms? ---Yes  Will a triage be completed? ---Yes  Related visit to physician within the last 2 weeks? ---No  Does the PT have any chronic conditions? (i.e. diabetes, asthma, etc.) ---Yes  List chronic conditions. ---See MR  Is this a behavioral health or substance abuse call? ---No     Guidelines    Guideline Title Affirmed Question Affirmed Notes  Urination Pain - Male All other males with painful urination    Final Disposition User   See Physician within 24 Hours Standifer, RN, Conservator, museum/gallery states that he wants an appt at the office, he does not want to have to go to the ED or UCC. Please call him back and let him know if he can be worked in today or tomorrow morning.   Referrals  REFERRED TO PCP OFFICE   Caller Disagree/Comply Comply  Caller Understands Yes  PreDisposition Call Doctor

## 2016-10-15 NOTE — Telephone Encounter (Signed)
Patient agreed to come in the morning at 9:30. Juliann Pulse has double booked your schedule.

## 2016-10-15 NOTE — Telephone Encounter (Signed)
Please advise the patient he should be evaluated at urgent care or the walk-in clinic for this issue.

## 2016-10-15 NOTE — Telephone Encounter (Signed)
Patient is wanting to establish care with you. He does not have an appointment with you to do so. He is having urinary frequency and pain with urination and when he does try to go he just dribbles. He has been having these symptoms since Saturday. Denies any other symptoms. Patient has refused to be seen at ED or UC and declined appointments at other offices within [place]. You have no 30 min slots to see him for the next month. Please advise.

## 2016-10-15 NOTE — Telephone Encounter (Signed)
Patient has been scheduled 11/29 at 9:15 to established care. Advised patient that he should go to walk in or urgent care for acute symptoms. Patient declined multiple times.

## 2016-10-15 NOTE — Telephone Encounter (Signed)
fyi

## 2016-10-15 NOTE — Telephone Encounter (Signed)
Patient currently has trouble with urination. Patient has urine frequency every 10 min. He has a pain during urination with dribbles. Pt has requested a call for an appt in this office only .  Pt transferred to [facility] for severity in pain  Pt contact  [phone]

## 2016-10-16 ENCOUNTER — Encounter: Payer: Self-pay | Admitting: Family Medicine

## 2016-10-16 ENCOUNTER — Ambulatory Visit (INDEPENDENT_AMBULATORY_CARE_PROVIDER_SITE_OTHER): Payer: Medicare HMO | Admitting: Family Medicine

## 2016-10-16 ENCOUNTER — Other Ambulatory Visit: Payer: Self-pay

## 2016-10-16 VITALS — BP 152/82 | HR 100 | Temp 98.1°F | Wt 126.2 lb

## 2016-10-16 DIAGNOSIS — N402 Nodular prostate without lower urinary tract symptoms: Secondary | ICD-10-CM | POA: Diagnosis not present

## 2016-10-16 DIAGNOSIS — R300 Dysuria: Secondary | ICD-10-CM

## 2016-10-16 DIAGNOSIS — Z85820 Personal history of malignant melanoma of skin: Secondary | ICD-10-CM

## 2016-10-16 LAB — POCT URINALYSIS DIPSTICK
BILIRUBIN UA: NEGATIVE
Glucose, UA: NEGATIVE
KETONES UA: NEGATIVE
Leukocytes, UA: NEGATIVE
Nitrite, UA: NEGATIVE
PH UA: 6.5 (ref 5.0–8.0)
Protein, UA: POSITIVE
RBC UA: NEGATIVE
SPEC GRAV UA: 1.02 (ref 1.010–1.025)
Urobilinogen, UA: 0.2 E.U./dL

## 2016-10-16 LAB — PSA: PSA: 0.95 ng/mL (ref 0.10–4.00)

## 2016-10-16 LAB — URINALYSIS, MICROSCOPIC ONLY
RBC / HPF: NONE SEEN (ref 0–?)
WBC, UA: NONE SEEN (ref 0–?)

## 2016-10-16 NOTE — Progress Notes (Signed)
  [name], MD Phone: [phone]  Justin Andrews is a 82 y.o. male who presents today for same-day visit.  Notes dysuria started on Saturday. He had increased frequency and urgency. Some straining and intermittent flow at that time. Some dribbling after urinating. Since then it has progressively improved. He notes normal stream now. No straining. No dysuria. Frequency has almost resolved. No urgency. No fevers. No stomach pain. No discharge. No flank pain. No history of prostate cancer. Does report possible BPH issues in the past.  He also reports a history of melanoma. He's had it removed from his left ear. He's had a sore over his left lateral eyebrow for 4-5 years per his report. He saw dermatology 1 year ago. He does not want to see them again.  ROS see history of present illness  Objective  Physical Exam Vitals:   10/16/16 0909  BP: (!) 152/82  Pulse: 100  Temp: 98.1 F (36.7 C)  SpO2: 96%    BP Readings from Last 3 Encounters:  10/16/16 (!) 152/82  09/12/16 (!) 158/78  08/05/16 (!) 168/98   Wt Readings from Last 3 Encounters:  10/16/16 126 lb 3.2 oz (57.2 kg)  09/12/16 131 lb 6 oz (59.6 kg)  08/05/16 129 lb 4 oz (58.6 kg)    Physical Exam  Constitutional: No distress.  HENT:  Head: Normocephalic and atraumatic.    Cardiovascular: Normal rate, regular rhythm and normal heart sounds.   Pulmonary/Chest: Effort normal and breath sounds normal.  Abdominal: Soft. Bowel sounds are normal. He exhibits no distension. There is no tenderness. There is no rebound and no guarding.  Genitourinary:  Genitourinary Comments: Normal rectum, prostate mild to moderately enlarged with nodule left lobe approximately towards the rectum, prostate is nontender, no bogginess  Skin: He is not diaphoretic.     Assessment/Plan: Please see individual problem list.  Dysuria Patient with resolved dysuria. Patient could've had some level of prostatitis though exam is benign. Unlikely  UTI. Urinating back to normal. He'll monitor symptoms. We'll send a UA.  Prostate nodule Nodule noted on exam today. We'll check a PSA and refer to urology.  History of melanoma History of melanoma. Discussed following up with dermatology given the nonhealing wound on his left forehead though he declined. He does not want to follow-up for his melanoma anymore.   Orders Placed This Encounter  Procedures  . PSA  . Ambulatory referral to Urology    Referral Priority:   Routine    Referral Type:   Consultation    Referral Reason:   Specialty Services Required    Requested Specialty:   Urology    Number of Visits Requested:   1  . POCT Urinalysis Dipstick    Standing Status:   Future    Number of Occurrences:   1    Standing Expiration Date:   12/16/2016   [name], MD [place]

## 2016-10-16 NOTE — Assessment & Plan Note (Addendum)
Patient with resolved dysuria. Patient could've had some level of prostatitis though exam is benign. Unlikely UTI. Urinating back to normal. He'll monitor symptoms. We'll send a UA.

## 2016-10-16 NOTE — Addendum Note (Signed)
Addended by: Arby Barrette on: 10/16/2016 11:46 AM   Modules accepted: Orders

## 2016-10-16 NOTE — Assessment & Plan Note (Signed)
Nodule noted on exam today. We'll check a PSA and refer to urology.

## 2016-10-16 NOTE — Assessment & Plan Note (Signed)
History of melanoma. Discussed following up with dermatology given the nonhealing wound on his left forehead though he declined. He does not want to follow-up for his melanoma anymore.

## 2016-10-16 NOTE — Patient Instructions (Signed)
Nice to see you. We'll have you go to the lab and check lab work. We'll check your urine as well. We'll refer you to a urologist. If you get to the point where you cannot urinate you need to be reevaluated. If you develop fevers please be reevaluated as well.

## 2016-10-16 NOTE — Telephone Encounter (Signed)
Last OV 25/24/79 last filled  Folic acid 98/00/12 90 1rf by Dr.Cook Ferrous sulfate 07/25/16 180 1rf by Dr.Cook b12 07/25/16 90 1rf by Dr.Cook

## 2016-10-17 LAB — URINE CULTURE
MICRO NUMBER: 81129163
SPECIMEN QUALITY: ADEQUATE

## 2016-10-17 MED ORDER — FERROUS SULFATE 325 (65 FE) MG PO TABS: 325.0000 mg | ORAL_TABLET | Freq: Two times a day (BID) | ORAL | 1 refills | Status: DC

## 2016-10-17 MED ORDER — FOLIC ACID 1 MG PO TABS: 1.0000 mg | ORAL_TABLET | Freq: Every day | ORAL | 1 refills | Status: DC

## 2016-10-17 MED ORDER — B-12 1000 MCG PO TABS: 1.0000 | ORAL_TABLET | Freq: Every day | ORAL | 1 refills | Status: DC

## 2016-10-21 ENCOUNTER — Other Ambulatory Visit: Payer: Self-pay

## 2016-10-21 ENCOUNTER — Ambulatory Visit: Payer: Self-pay | Admitting: Urology

## 2016-10-21 ENCOUNTER — Encounter: Payer: Self-pay | Admitting: Urology

## 2016-10-21 DIAGNOSIS — N281 Cyst of kidney, acquired: Secondary | ICD-10-CM | POA: Insufficient documentation

## 2016-10-21 MED ORDER — CARVEDILOL 6.25 MG PO TABS: 6.2500 mg | ORAL_TABLET | Freq: Two times a day (BID) | ORAL | 3 refills | Status: DC

## 2016-10-21 NOTE — Progress Notes (Deleted)
10/21/2016 8:36 AM   Justin Andrews [birth year] [id number]  Referring provider: Leone Haven, MD [address] [place], [place] [address]  CC: New Patient - Abnormal Prostate Exam, Bilateral Non-Complex Renal Cysts  HPI:  1 - Abnormal Prostate Exam - abnormal exam by PCP 2018 at age 82. PSA 0.95 2018. XXX Aquadale prostate exam. CT 12/2015 of abd / pelvis w/o severely enlarged prostate or any pelvic adenopathy / or bone lesions.  2 - Bilateral Non-Complex Renal Cysts - few scattered non-complex cysts w/o mass effect / nodules / calcification incidental on trauma CT 12/2015.    PMH sig for Justin Andrews. His PCP is Justin Rumps MD with [facility] in [place].  Today "Justin Andrews" is seen as new patient for above.   PMH: Past Medical History:  Diagnosis Date  . History of melanoma   . Hypertension   . Osteoarthritis   . Tobacco abuse     Surgical History: Past Surgical History:  Procedure Laterality Date  . ANKLE FRACTURE SURGERY    . EXTERNAL EAR SURGERY    . ROTATOR CUFF REPAIR    . TONSILLECTOMY    . TYMPANOPLASTY W/ MASTOIDECTOMY     Patient with reports of mastoid surgery (appears to have had surgery on TM; Left).    Home Medications:  Allergies as of 10/21/2016      Reactions   Justin Andrews Other (See Comments)      Medication List       Accurate as of 10/21/16  8:36 AM. Always use your most recent med list.          B-12 1000 MCG Tabs Take 1 tablet by mouth daily.   carvedilol 6.25 MG tablet Commonly known as:  COREG Take 1 tablet (6.25 mg total) by mouth 2 (two) times daily with a meal.   ferrous sulfate 325 (65 FE) MG tablet Take 1 tablet (325 mg total) by mouth 2 (two) times daily.   folic acid 1 MG tablet Commonly known as:  FOLVITE Take 1 tablet (1 mg total) by mouth daily.   lisinopril 20 MG tablet Commonly known as:  PRINIVIL,ZESTRIL Take 20 mg by mouth daily.   ranitidine 150 MG tablet Commonly known as:  ZANTAC Take 150 mg by mouth 2  (two) times daily.   sertraline 50 MG tablet Commonly known as:  ZOLOFT Take 1 tablet (50 mg total) by mouth daily.   traMADol 50 MG tablet Commonly known as:  ULTRAM Take 2 tablets (100 mg total) by mouth every 8 (eight) hours.   traZODone 50 MG tablet Commonly known as:  DESYREL Take 1 tablet (50 mg total) by mouth at bedtime.       Allergies:  Allergies  Allergen Reactions  . Justin Andrews Other (See Comments)    Family History: Family History  Problem Relation Age of Onset  . Family history unknown: Yes    Social History:  reports that he has been smoking Cigarettes.  He has never used smokeless tobacco. He reports that he does not drink alcohol or use drugs.   Review of Systems  Gastrointestinal (upper)  : Negative for upper GI symptoms  Gastrointestinal (lower) : Negative for lower GI symptoms  Constitutional : Negative for symptoms  Skin: Negative for skin symptoms  Eyes: Negative for eye symptoms  Ear/Nose/Throat : Negative for Ear/Nose/Throat symptoms  Hematologic/Lymphatic: Negative for Hematologic/Lymphatic symptoms  Cardiovascular : Negative for cardiovascular symptoms  Respiratory : Negative for respiratory symptoms  Endocrine: Negative for endocrine  symptoms  Musculoskeletal: Negative for musculoskeletal symptoms  Neurological: Negative for neurological symptoms  Psychologic: Negative for psychiatric symptoms  Physical Exam: There were no vitals taken for this visit.  Constitutional:  Alert and oriented, No acute distress. HEENT: [place] AT, moist mucus membranes.  Trachea midline, no masses. Cardiovascular: No clubbing, cyanosis, or edema. Respiratory: Normal respiratory effort, no increased work of breathing. GI: Abdomen is soft, nontender, nondistended, no abdominal masses GU: No CVA tenderness. *** Skin: No rashes, bruises or suspicious lesions. Lymph: No cervical or inguinal adenopathy. Neurologic: Grossly intact, no focal  deficits, moving all 4 extremities. Psychiatric: Normal mood and affect.  Laboratory Data: Lab Results  Component Value Date   WBC 7.3 09/12/2016   HGB 11.4 (L) 09/12/2016   HCT 35.5 (L) 09/12/2016   MCV 94.7 09/12/2016   PLT 299.0 09/12/2016    Lab Results  Component Value Date   CREATININE 1.75 (H) 07/01/2016    No results found for: PSA1  No results found for: TESTOSTERONE  Lab Results  Component Value Date   HGBA1C 5.7 07/23/2016    Urinalysis Lab Results  Component Value Date   SPECGRAV 1.020 10/16/2016   PHUR 6.5 10/16/2016   COLORU yellow 10/16/2016   APPEARANCEUR HAZY (A) 09/13/2015   LEUKOCYTESUR Negative 10/16/2016   PROTEINUR positive 10/16/2016   GLUCOSEU NEGATIVE 09/13/2015   KETONESU negative 10/16/2016   RBCU none seen 10/16/2016   BILIRUBINUR negative 10/16/2016   NITRITE negative 10/16/2016    Lab Results  Component Value Date   BACTERIA NONE SEEN 09/13/2015    Pertinent Imaging: *** No results found for this or any previous visit. No results found for this or any previous visit. No results found for this or any previous visit. No results found for this or any previous visit. No results found for this or any previous visit. No results found for this or any previous visit. No results found for this or any previous visit. No results found for this or any previous visit.  Assessment & Plan:  ***  1 - Abnormal Prostate Exam - pt reassured. His PSA is normal and his exam only with mild assyemtry. At age 82 overall picture does NOT favor clinically significant prostate. .  2 - Bilateral Non-Complex Renal Cysts - no indication for further evaluation.   Alexis Frock, [facility] [address] [place], [place] [address] [phone]

## 2016-10-21 NOTE — Telephone Encounter (Signed)
Last OV 10/16/16 last filled by Dr.Cook 07/23/16 60 3rf

## 2016-10-25 ENCOUNTER — Telehealth: Payer: Self-pay | Admitting: Radiology

## 2016-10-25 DIAGNOSIS — R809 Proteinuria, unspecified: Secondary | ICD-10-CM

## 2016-10-25 NOTE — Telephone Encounter (Signed)
Pt coming in for labs Monday, please place future orders. Thank you 

## 2016-10-25 NOTE — Addendum Note (Signed)
Addended by: Leone Haven on: 10/25/2016 06:13 PM   Modules accepted: Orders

## 2016-10-25 NOTE — Telephone Encounter (Signed)
Ordered. If UA has protein on it please send the protein/creatinine ratio. Thanks

## 2016-10-28 ENCOUNTER — Other Ambulatory Visit (INDEPENDENT_AMBULATORY_CARE_PROVIDER_SITE_OTHER): Payer: Medicare HMO

## 2016-10-28 DIAGNOSIS — R809 Proteinuria, unspecified: Secondary | ICD-10-CM | POA: Diagnosis not present

## 2016-10-28 LAB — POCT URINALYSIS DIPSTICK
BILIRUBIN UA: NEGATIVE
GLUCOSE UA: NEGATIVE
Ketones, UA: NEGATIVE
Leukocytes, UA: NEGATIVE
Nitrite, UA: NEGATIVE
Protein, UA: 30
SPEC GRAV UA: 1.015 (ref 1.010–1.025)
Urobilinogen, UA: 0.2 E.U./dL
pH, UA: 5.5 (ref 5.0–8.0)

## 2016-10-28 NOTE — Addendum Note (Signed)
Addended by: Leeanne Rio on: 10/28/2016 04:19 PM   Modules accepted: Orders

## 2016-10-29 LAB — URINALYSIS, MICROSCOPIC ONLY
Bacteria, UA: NONE SEEN /HPF
Hyaline Cast: NONE SEEN /LPF
SQUAMOUS EPITHELIAL / LPF: NONE SEEN /HPF (ref <=5)
WBC, UA: NONE SEEN /HPF (ref 0–5)

## 2016-10-29 LAB — PROTEIN / CREATININE RATIO, URINE
CREATININE, URINE: 126 mg/dL (ref 20–320)
PROTEIN/CREAT RATIO: 294 mg/g{creat} — AB (ref 22–128)
Total Protein, Urine: 37 mg/dL — ABNORMAL HIGH (ref 5–25)

## 2016-11-01 ENCOUNTER — Telehealth: Payer: Self-pay

## 2016-11-01 NOTE — Telephone Encounter (Signed)
Attempted to reach patient, unable to leave a VM, busy signal.  Will try again, thanks

## 2016-11-01 NOTE — Telephone Encounter (Signed)
Please contact the patient and get more details if you are able. To my knowledge she does not have a history of colon cancer though I did find a possible prostate nodule at his last visit. Thanks.

## 2016-11-01 NOTE — Telephone Encounter (Signed)
Called patient to give lab results and schedule lab appointment for urine, patient states he don't want to come in for a lab appointment and that he needs to see his doctor. Asked patient what he needed to see his doctor for and he states he just needs to see his doctor for his colon cancer and whatever else is going on with him, patient states he feels like he's dying. Informed patient I can get him scheduled with the Nurse practitioner but he said he don't want to see her because she don't know what is going on his life. Attempted to ask patient what his symptoms are and he was unable to tell me and became aggravated. He states he also needs a refill on his tramadol. Tanya please try to call patient to get more information.s

## 2016-11-06 ENCOUNTER — Other Ambulatory Visit: Payer: Self-pay | Admitting: Family Medicine

## 2016-11-06 MED ORDER — TRAMADOL HCL 50 MG PO TABS: 100.0000 mg | ORAL_TABLET | Freq: Three times a day (TID) | ORAL | 2 refills | Status: DC

## 2016-11-06 NOTE — Telephone Encounter (Signed)
Please fax refill. Patient needs follow-up for his weight loss. Please find out if he has any other symptoms. Given his rapid weight loss since our last visit I would suggest getting him in to see any provider that has openings as I don't have any openings over the next month.

## 2016-11-06 NOTE — Telephone Encounter (Signed)
Pt called and wants his traMADol (ULTRAM) 50 MG tablet. He is out of his medication. Pt states he is down to 110lbs.

## 2016-11-06 NOTE — Telephone Encounter (Signed)
Last OV 10/16/16 last filled 08/05/16 180 2rf

## 2016-11-07 NOTE — Telephone Encounter (Signed)
Tried calling home number busy

## 2016-11-07 NOTE — Telephone Encounter (Signed)
Tried calling number busy. 

## 2016-11-08 NOTE — Telephone Encounter (Signed)
Tried calling patient number busy again. Left message for sister need contact number for patient .

## 2016-11-12 ENCOUNTER — Ambulatory Visit: Payer: Medicare HMO | Admitting: Family Medicine

## 2016-11-12 NOTE — Telephone Encounter (Signed)
Spoke with patient states he has been having green black "tarry colored stools for 4 months denies diarrhea, or bladder problems.     No other symptoms to report.  Updated phone number [phone]. Advised patient scheduled for Friday at 11/15/16 @9 :00pm a/r 8:45am.

## 2016-11-13 ENCOUNTER — Ambulatory Visit (INDEPENDENT_AMBULATORY_CARE_PROVIDER_SITE_OTHER): Payer: Medicare HMO | Admitting: Urology

## 2016-11-13 ENCOUNTER — Encounter: Payer: Self-pay | Admitting: Urology

## 2016-11-13 VITALS — BP 147/84 | HR 65 | Ht 67.0 in | Wt 133.2 lb

## 2016-11-13 DIAGNOSIS — N402 Nodular prostate without lower urinary tract symptoms: Secondary | ICD-10-CM | POA: Diagnosis not present

## 2016-11-13 LAB — URINALYSIS, COMPLETE
Bilirubin, UA: NEGATIVE
GLUCOSE, UA: NEGATIVE
KETONES UA: NEGATIVE
Leukocytes, UA: NEGATIVE
NITRITE UA: NEGATIVE
Specific Gravity, UA: 1.02 (ref 1.005–1.030)
UUROB: 0.2 mg/dL (ref 0.2–1.0)
pH, UA: 6 (ref 5.0–7.5)

## 2016-11-13 LAB — MICROSCOPIC EXAMINATION
Bacteria, UA: NONE SEEN
Epithelial Cells (non renal): NONE SEEN /hpf (ref 0–10)
RBC MICROSCOPIC, UA: NONE SEEN /HPF (ref 0–?)
WBC UA: NONE SEEN /HPF (ref 0–?)

## 2016-11-13 NOTE — Progress Notes (Signed)
11/13/2016 10:38 AM   Justin Andrews 1934-06-29 [id number]  Referring provider: Leone Haven, MD [address] [place], [place] [address]  Chief Complaint  Patient presents with  . New Patient (Initial Visit)    Prostate nodule    HPI: Justin Andrews is an 82 year old male seen in consultation at the request of Dr. Caryl Bis for evaluation of a prostate nodule.  He was seen on 10/16/2016 with a several day history of urinary frequency, urgency and mild dysuria.  His urinalysis was unremarkable and urine culture was negative.  He was noted to have a prostate nodule.  A PSA was drawn which was low at 0.95.  He has mild intermittent urinary stream, hesitancy and occasional straining to urinate.  He presently denies dysuria or gross hematuria.  There is no history of recurrent UTI.  He denies prior urologic evaluation.  He states his voiding symptoms are not bothersome.   PMH: Past Medical History:  Diagnosis Date  . History of melanoma   . Hypertension   . Osteoarthritis   . Tobacco abuse     Surgical History: Past Surgical History:  Procedure Laterality Date  . ANKLE FRACTURE SURGERY    . EXTERNAL EAR SURGERY    . ROTATOR CUFF REPAIR    . TONSILLECTOMY    . TYMPANOPLASTY W/ MASTOIDECTOMY     Patient with reports of mastoid surgery (appears to have had surgery on TM; Left).    Home Medications:  Allergies as of 11/13/2016      Reactions   Aspirin Other (See Comments)      Medication List        Accurate as of 11/13/16 10:38 AM. Always use your most recent med list.          amLODipine 5 MG tablet Commonly known as:  NORVASC Take 5 mg daily by mouth.   B-12 1000 MCG Tabs Take 1 tablet by mouth daily.   carvedilol 6.25 MG tablet Commonly known as:  COREG Take 1 tablet (6.25 mg total) by mouth 2 (two) times daily with a meal.   ferrous sulfate 325 (65 FE) MG tablet Take 1 tablet (325 mg total) by mouth 2 (two) times daily.   folic acid 1 MG  tablet Commonly known as:  FOLVITE Take 1 tablet (1 mg total) by mouth daily.   lisinopril 20 MG tablet Commonly known as:  PRINIVIL,ZESTRIL Take 20 mg by mouth daily.   ranitidine 150 MG tablet Commonly known as:  ZANTAC Take 150 mg by mouth 2 (two) times daily.   sertraline 50 MG tablet Commonly known as:  ZOLOFT Take 1 tablet (50 mg total) by mouth daily.   traMADol 50 MG tablet Commonly known as:  ULTRAM Take 2 tablets (100 mg total) by mouth every 8 (eight) hours.   traZODone 50 MG tablet Commonly known as:  DESYREL Take 1 tablet (50 mg total) by mouth at bedtime.       Allergies:  Allergies  Allergen Reactions  . Aspirin Other (See Comments)    Family History: Family History  Problem Relation Age of Onset  . Prostate cancer Neg Hx   . Bladder Cancer Neg Hx   . Kidney cancer Neg Hx     Social History:  reports that he has been smoking cigarettes.  he has never used smokeless tobacco. He reports that he does not drink alcohol or use drugs.  ROS: UROLOGY Frequent Urination?: No Hard to postpone urination?: No Burning/pain with  urination?: No Get up at night to urinate?: Yes Leakage of urine?: No Urine stream starts and stops?: Yes Trouble starting stream?: Yes Do you have to strain to urinate?: Yes Blood in urine?: No Urinary tract infection?: No Sexually transmitted disease?: No Injury to kidneys or bladder?: No Painful intercourse?: No Weak stream?: No Erection problems?: No Penile pain?: No  Gastrointestinal Nausea?: No Vomiting?: No Indigestion/heartburn?: Yes Diarrhea?: No Constipation?: Yes  Constitutional Fever: No Night sweats?: No Weight loss?: Yes Fatigue?: Yes  Skin Skin rash/lesions?: No Itching?: No  Eyes Blurred vision?: Yes Double vision?: Yes  Ears/Nose/Throat Sore throat?: No Sinus problems?: No  Hematologic/Lymphatic Swollen glands?: No Easy bruising?: No  Cardiovascular Leg swelling?: No Chest pain?:  No  Respiratory Cough?: Yes Shortness of breath?: Yes  Endocrine Excessive thirst?: No  Musculoskeletal Back pain?: No Joint pain?: Yes  Neurological Headaches?: No Dizziness?: Yes  Psychologic Depression?: Yes Anxiety?: Yes  Physical Exam: BP (!) 147/84 (BP Location: Right Arm, Patient Position: Sitting, Cuff Size: Normal)   Pulse 65   Ht 5\' 7"  (1.702 m)   Wt 133 lb 3.2 oz (60.4 kg)   BMI 20.86 kg/m   Constitutional:  Alert and oriented, No acute distress. HEENT: [place] AT, moist mucus membranes.  Trachea midline, no masses. Cardiovascular: No clubbing, cyanosis, or edema. Respiratory: Normal respiratory effort, no increased work of breathing. GI: Abdomen is soft, nontender, nondistended, no abdominal masses GU: No CVA tenderness.  Penis circumcised without lesions.  Testes descended bilaterally without masses or tenderness.  Prostate 40 g, smooth with minimal nodularity just to the left of midline. Skin: No rashes, bruises or suspicious lesions. Lymph: No cervical or inguinal adenopathy. Neurologic: Grossly intact, no focal deficits, moving all 4 extremities. Psychiatric: Normal mood and affect.  Laboratory Data: Lab Results  Component Value Date   WBC 7.3 09/12/2016   HGB 11.4 (L) 09/12/2016   HCT 35.5 (L) 09/12/2016   MCV 94.7 09/12/2016   PLT 299.0 09/12/2016    Lab Results  Component Value Date   CREATININE 1.75 (H) 07/01/2016    Urinalysis Lab Results  Component Value Date   SPECGRAV 1.015 10/28/2016   PHUR 5.5 10/28/2016   COLORU yellow 10/28/2016   APPEARANCEUR HAZY (A) 09/13/2015   LEUKOCYTESUR Negative 10/28/2016   PROTEINUR 30 10/28/2016   GLUCOSEU NEGATIVE 09/13/2015   KETONESU neg 10/28/2016   RBCU 0-2 10/28/2016   BILIRUBINUR neg 10/28/2016   NITRITE neg 10/28/2016    Lab Results  Component Value Date   BACTERIA NONE SEEN 10/28/2016     Assessment & Plan:    1. Prostate nodule Subtle prostate nodule which is not suspicious.   PSA is very low. PSA/DRE 1year.  - Urinalysis, Complete  2.  BPH with lower urinary tract symptoms He has mild voiding symptoms which are non-bothersome.   Return in about 1 year (around 11/13/2017) for Recheck, PSA.  Abbie Sons, [place] [address], [place] [address] [phone]

## 2016-11-14 NOTE — Telephone Encounter (Signed)
Plan to see patient in the office.

## 2016-11-15 ENCOUNTER — Other Ambulatory Visit: Payer: Self-pay | Admitting: Family Medicine

## 2016-11-15 ENCOUNTER — Telehealth: Payer: Self-pay

## 2016-11-15 ENCOUNTER — Encounter: Payer: Self-pay | Admitting: Family Medicine

## 2016-11-15 ENCOUNTER — Ambulatory Visit (INDEPENDENT_AMBULATORY_CARE_PROVIDER_SITE_OTHER): Payer: Medicare HMO | Admitting: Family Medicine

## 2016-11-15 DIAGNOSIS — M159 Polyosteoarthritis, unspecified: Secondary | ICD-10-CM

## 2016-11-15 DIAGNOSIS — R2689 Other abnormalities of gait and mobility: Secondary | ICD-10-CM

## 2016-11-15 DIAGNOSIS — M150 Primary generalized (osteo)arthritis: Secondary | ICD-10-CM | POA: Diagnosis not present

## 2016-11-15 DIAGNOSIS — G8929 Other chronic pain: Secondary | ICD-10-CM | POA: Insufficient documentation

## 2016-11-15 DIAGNOSIS — R1011 Right upper quadrant pain: Secondary | ICD-10-CM

## 2016-11-15 DIAGNOSIS — R1013 Epigastric pain: Secondary | ICD-10-CM

## 2016-11-15 DIAGNOSIS — M8949 Other hypertrophic osteoarthropathy, multiple sites: Secondary | ICD-10-CM

## 2016-11-15 LAB — COMPREHENSIVE METABOLIC PANEL
ALT: 8 U/L (ref 0–53)
AST: 13 U/L (ref 0–37)
Albumin: 4.1 g/dL (ref 3.5–5.2)
Alkaline Phosphatase: 60 U/L (ref 39–117)
BUN: 21 mg/dL (ref 6–23)
CALCIUM: 9.8 mg/dL (ref 8.4–10.5)
CO2: 28 mEq/L (ref 19–32)
CREATININE: 1.93 mg/dL — AB (ref 0.40–1.50)
Chloride: 101 mEq/L (ref 96–112)
GFR: 35.71 mL/min — AB (ref >60.00)
Glucose, Bld: 94 mg/dL (ref 70–99)
POTASSIUM: 4.3 meq/L (ref 3.5–5.1)
Sodium: 137 mEq/L (ref 135–145)
TOTAL PROTEIN: 7.3 g/dL (ref 6.0–8.3)
Total Bilirubin: 0.4 mg/dL (ref 0.2–1.2)

## 2016-11-15 LAB — CBC
HCT: 34.9 % — ABNORMAL LOW (ref 39.0–52.0)
Hemoglobin: 11.7 g/dL — ABNORMAL LOW (ref 13.0–17.0)
MCHC: 33.4 g/dL (ref 30.0–36.0)
MCV: 97.6 fl (ref 78.0–100.0)
PLATELETS: 306 10*3/uL (ref 150.0–400.0)
RBC: 3.58 Mil/uL — AB (ref 4.22–5.81)
RDW: 15 % (ref 11.5–15.5)
WBC: 7.5 10*3/uL (ref 4.0–10.5)

## 2016-11-15 LAB — LIPASE: LIPASE: 18 U/L (ref 11.0–59.0)

## 2016-11-15 NOTE — Patient Instructions (Addendum)
Nice to see you. If you develop worsening discomfort please be evaluated. We need to determine what the cause of your stomach pain is prior to altering your pain medication regimen.

## 2016-11-15 NOTE — Assessment & Plan Note (Signed)
Patient with 3 months of epigastric abdominal pain.  Dark colored stools at times.  FOBT attempted today though no stool noted in the rectal vault.  No blood on the glove.  Given epigastric abdominal pain and mild intermittent guarding I discussed obtaining CT scan abdomen and pelvis today though he declined doing this today and would prefer to do it on Monday.  He will obtain lab work today.  If he has worsening symptoms he will be evaluated sooner for CT scan.  He is given return precautions.

## 2016-11-15 NOTE — Progress Notes (Signed)
Justin Rumps, MD Phone: [phone]  Justin Andrews is a 82 y.o. male who presents today for follow-up.  Patient reports for the last 3 months he has had epigastric abdominal discomfort.  He has increased amounts of bowel movements though no diarrhea.  Occasional vomiting 1 time a day.  Bowel movement every other day.  No bloody stools or does note intermittent brown, black, and green stools.  The pain is not sharp.  He has had no fevers.  He has had no significant weight loss on our scales.  No urine abnormalities.  He does report he has been slightly off balance during this timeframe as well.  He notes no numbness or weakness.  No dizziness or lightheadedness.  Just occasionally feels off balance.  Patient does have chronic arthritic pain in his hands for which he takes tramadol.  He notes the tramadol is only minimally been   Social History   Tobacco Use  Smoking Status Current Every Day Smoker  . Types: Cigarettes  Smokeless Tobacco Never Used     ROS see history of present illness  Objective  Physical Exam Vitals:   11/15/16 0858  BP: (!) 156/80  Pulse: 90  Temp: 97.7 F (36.5 C)  SpO2: 97%    BP Readings from Last 3 Encounters:  11/15/16 (!) 156/80  11/13/16 (!) 147/84  10/16/16 (!) 152/82   Wt Readings from Last 3 Encounters:  11/15/16 131 lb 12.8 oz (59.8 kg)  11/13/16 133 lb 3.2 oz (60.4 kg)  10/16/16 126 lb 3.2 oz (57.2 kg)    Physical Exam  Constitutional: No distress.  HENT:  Head: Normocephalic and atraumatic.  Mouth/Throat: Oropharynx is clear and moist. No oropharyngeal exudate.  Eyes: Conjunctivae are normal. Pupils are equal, round, and reactive to light.  Cardiovascular: Normal rate, regular rhythm and normal heart sounds.  Pulmonary/Chest: Effort normal and breath sounds normal.  Abdominal: Soft. Bowel sounds are normal. He exhibits no distension. There is tenderness (epigastric). There is no rebound.  Intermittent guarding    Musculoskeletal: He exhibits no edema.  Neurological: He is alert. Gait normal.  CN 2-12 intact, 5/5 strength in bilateral biceps, triceps, grip, quads, hamstrings, plantar and dorsiflexion, sensation to light touch intact in bilateral UE and LE, normal rapid alternating movements, normal finger to nose  Skin: Skin is warm and dry. He is not diaphoretic.     Assessment/Plan: Please see individual problem list.  Epigastric pain Patient with 3 months of epigastric abdominal pain.  Dark colored stools at times.  FOBT attempted today though no stool noted in the rectal vault.  No blood on the glove.  Given epigastric abdominal pain and mild intermittent guarding I discussed obtaining CT scan abdomen and pelvis today though he declined doing this today and would prefer to do it on Monday.  He will obtain lab work today.  If he has worsening symptoms he will be evaluated sooner for CT scan.  He is given return precautions.  Balance problem Intermittent issue.  Appears stable today on exam.  Neurologically intact.  Potentially could be dehydration related with bowel movement issues.  Will proceed with evaluation of his abdomen and if no obvious cause noted on labs or CT scan consider further evaluation of his balance.  Osteoarthritis Patient will continue tramadol for now.   Willaim was seen today for follow-up and abdominal pain.  Diagnoses and all orders for this visit:  Epigastric pain -     Cancel: CT Abdomen Pelvis W Contrast;  Future -     Comp Met (CMET) -     Lipase -     CT Abdomen Pelvis Wo Contrast; Future -     CBC  Balance problem  Primary osteoarthritis involving multiple joints    Orders Placed This Encounter  Procedures  . CT Abdomen Pelvis Wo Contrast    Standing Status:   Future    Standing Expiration Date:   02/15/2018    Order Specific Question:   Preferred imaging location?    Answer:   [facility]    Order Specific Question:   Radiology Contrast Protocol -  do NOT remove file path    Answer:   [email or web address]  . Comp Met (CMET)  . Lipase  . CBC    No orders of the defined types were placed in this encounter.    Justin Rumps, MD [place]

## 2016-11-15 NOTE — Assessment & Plan Note (Signed)
Patient will continue tramadol for now.

## 2016-11-15 NOTE — Telephone Encounter (Signed)
See result note, if patient calls back please give results

## 2016-11-15 NOTE — Assessment & Plan Note (Signed)
Intermittent issue.  Appears stable today on exam.  Neurologically intact.  Potentially could be dehydration related with bowel movement issues.  Will proceed with evaluation of his abdomen and if no obvious cause noted on labs or CT scan consider further evaluation of his balance.

## 2016-11-15 NOTE — Telephone Encounter (Signed)
Copied from Rosa Sanchez. Topic: Inquiry >> Nov 15, 2016  1:05 PM Conception Chancy, NT wrote: Reason for CRM: pt is returning call to the office.  >> Nov 15, 2016  1:17 PM Yvette Rack wrote: Patient calling ststing that someone had just called him from the office about 5 min ago

## 2016-11-18 ENCOUNTER — Ambulatory Visit: Payer: Self-pay | Admitting: *Deleted

## 2016-11-18 ENCOUNTER — Ambulatory Visit: Admission: RE | Admit: 2016-11-18 | Payer: Medicare HMO | Source: Ambulatory Visit

## 2016-11-18 NOTE — Telephone Encounter (Signed)
  Patient was seen Friday- no better today- he is wanting treatment for his stomach pain. Declines ED/Urgent Care.  Reason for Disposition . [1] SEVERE pain AND [2] age > 59  Answer Assessment - Initial Assessment Questions 1. LOCATION: "Where does it hurt?"      Stomach pain 2. RADIATION: "Does the pain shoot anywhere else?" (e.g., chest, back)     Stomach only 3. ONSET: "When did the pain begin?" (Minutes, hours or days ago)      2 weeks - tramadol doesn't help 4. SUDDEN: "Gradual or sudden onset?"     3 months ago- worse last 2 weeks 5. PATTERN "Does the pain come and go, or is it constant?"    - If constant: "Is it getting better, staying the same, or worsening?"      (Note: Constant means the pain never goes away completely; most serious pain is constant and it progresses)     - If intermittent: "How long does it last?" "Do you have pain now?"     (Note: Intermittent means the pain goes away completely between bouts)     Constant pain- does not change when eats or moves bowels 6. SEVERITY: "How bad is the pain?"  (e.g., Scale 1-10; mild, moderate, or severe)    - MILD (1-3): doesn't interfere with normal activities, abdomen soft and not tender to touch     - MODERATE (4-7): interferes with normal activities or awakens from sleep, tender to touch     - SEVERE (8-10): excruciating pain, doubled over, unable to do any normal activities       severe 7. RECURRENT SYMPTOM: "Have you ever had this type of abdominal pain before?" If so, ask: "When was the last time?" and "What happened that time?"      New- never had this before 8. CAUSE: "What do you think is causing the abdominal pain?"     unsure 9. RELIEVING/AGGRAVATING FACTORS: "What makes it better or worse?" (e.g., movement, antacids, bowel movement)     Nothing helps the pain 10. OTHER SYMPTOMS: "Has there been any vomiting, diarrhea, constipation, or urine problems?"       Vomiting daily- daily in the morning.  Protocols used:  ABDOMINAL PAIN - MALE-A-AH

## 2016-11-18 NOTE — Telephone Encounter (Signed)
Scheduled Dr. Weber Cooks [facility] for 11/19/16 due patient refuses any other facility.

## 2016-11-19 ENCOUNTER — Ambulatory Visit (INDEPENDENT_AMBULATORY_CARE_PROVIDER_SITE_OTHER): Payer: Medicare HMO | Admitting: Family Medicine

## 2016-11-19 ENCOUNTER — Encounter: Payer: Self-pay | Admitting: Family Medicine

## 2016-11-19 ENCOUNTER — Ambulatory Visit: Payer: Medicare HMO | Attending: Family Medicine

## 2016-11-19 DIAGNOSIS — R1013 Epigastric pain: Secondary | ICD-10-CM

## 2016-11-19 NOTE — Patient Instructions (Signed)
Justin Andrews will call about your referral. Take care.  Glad to see you.

## 2016-11-19 NOTE — Progress Notes (Signed)
Had been taking tramadol but w/o relief of the hand pain.  I'll have to defer to PCP about that.    Seen today at clinic, patient is new to me. Abd sx for the last 3 months. He had CT scan pending for day after tomorrow.  He had declined CT scan earlier, according to EMR.  His pain is getting worse daily.  No blood in stool.  He started drinking vodka "a few a day" due to pain.  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  Some occ vomiting.  Down 10 bs in the last 6 months.    According to my conversation with patient, he is taking his meds as listed in EMR.    He has a driver at the [facility].    Meds, vitals, and allergies reviewed.   ROS: Per HPI unless specifically indicated in ROS section   Nad, elderly male, he doesn't appear intoxicated.   ncat Neck supple, no LA rrr ctab abd soft, mildly ttp w/o rebound in the right/middle/left upper abd Normal BS Ext w/o edema.  Gait wnl

## 2016-11-19 NOTE — Assessment & Plan Note (Addendum)
Prev labs noted for his kidney function being slightly worse than prior. His hemoglobin is stable. His other lab work is unremarkable for the cause of his stomach pain.  D/w pt.  He wanted pain meds.  I don't have a cause for his abd pain at this point, so I didn't rx pain meds at this point.  If profound inc in abd pain, patient advised to go to ER.  I asked staff to move his prev ordered CT scan up, to be done later today with the results coming to me if during clinic hours, or going to the doc on call for PCP if resulted after hours.   We'll go from there.   Discussion with patient about mgmt about this point, and rationale for imaging given his recent labs.  Advised patient not to continue etoh.  He doesn't appear to be in distress. No SI/HI, okay for outpatient f/u.   >25 minutes spent in face to face time with patient, >50% spent in counselling or coordination of care.  Routed to PCP as FYI.

## 2016-11-20 ENCOUNTER — Telehealth: Payer: Self-pay | Admitting: Internal Medicine

## 2016-11-20 ENCOUNTER — Telehealth: Payer: Self-pay | Admitting: Family Medicine

## 2016-11-20 NOTE — Telephone Encounter (Signed)
Tried to call patient and received message that "mailbox is full and can not accept any messages at this time". Will have to try and call back later.

## 2016-11-20 NOTE — Telephone Encounter (Signed)
-----   Message from Justin Haven, MD sent at 11/15/2016  6:34 PM EST ----- Regarding: CT scan follow-up Hi Justin Andrews,   Thank you for following up on this scan for me. This patient is an 82 yo male with history of melanoma who presented with a several month history of epigastric abdominal pain associated with alternating green/black/brown increased frequency stools. He was tender with guarding on exam. Lipase and WBC were normal. He was minimally anemic. I attempted FOBT in the office though there was no stool in his rectal vault. He has renal dysfunction so I ordered a CT abdomen w/o contrast (his insurance did not approve CT abd/pelvis). Thanks again for following up on this.   Randall Hiss

## 2016-11-20 NOTE — Telephone Encounter (Signed)
Please check on patient.  See OV note.  He prev had order for CT.  According to EMR, he no showed for the CT last night.  If profoundly worse abd pain, then he needs ER eval.  O/w he can make arrangements to reschedule the CT.  Routed to PCP as FYI also.  Thanks.

## 2016-11-21 ENCOUNTER — Ambulatory Visit
Admission: RE | Admit: 2016-11-21 | Discharge: 2016-11-21 | Disposition: A | Discharge status: Home / Self Care | Payer: Medicare HMO | Source: Ambulatory Visit | Attending: Family Medicine | Admitting: Family Medicine

## 2016-11-21 ENCOUNTER — Ambulatory Visit: Admission: RE | Admit: 2016-11-21 | Payer: Medicare HMO | Source: Ambulatory Visit

## 2016-11-21 ENCOUNTER — Telehealth: Payer: Self-pay

## 2016-11-21 ENCOUNTER — Telehealth: Payer: Self-pay | Admitting: Family Medicine

## 2016-11-21 DIAGNOSIS — N281 Cyst of kidney, acquired: Secondary | ICD-10-CM | POA: Insufficient documentation

## 2016-11-21 DIAGNOSIS — I700 Atherosclerosis of aorta: Secondary | ICD-10-CM | POA: Diagnosis not present

## 2016-11-21 DIAGNOSIS — R1013 Epigastric pain: Secondary | ICD-10-CM

## 2016-11-21 DIAGNOSIS — K5710 Diverticulosis of small intestine without perforation or abscess without bleeding: Secondary | ICD-10-CM | POA: Insufficient documentation

## 2016-11-21 NOTE — Telephone Encounter (Signed)
Pt given results of lab results; pt also notified of no show for CT scan and cancelled MD appt on 12/05/16; contacted Larena Glassman at [facility] [place], coordinator; Lenna Sciara will reschedule CT for pt, and will reschedule previous appt based on CT results; pt verbalized understanding and is waiting for return call from [facility]

## 2016-11-21 NOTE — Telephone Encounter (Signed)
Spoke to pt regarding rescheduling his CT scan.He is questioning if he can get something for his stomach pain. He states that it is excruciating pain. He also states that he has to drink to ease the pain. If he can get something please send it to [facility] [address].  [place] cb [phone]

## 2016-11-21 NOTE — Telephone Encounter (Signed)
Tried calling,no vm Called patients niece and she states the patient is going today.

## 2016-11-21 NOTE — Telephone Encounter (Signed)
-----   Message from Marin Olp, MD sent at 11/21/2016 12:50 PM EST ----- Regarding: RE: CT scan follow-up Did you call to find out why?  Justin Andrews  ----- Message ----- From: Juanda Chance, CMA Sent: 11/20/2016   2:13 PM To: Leone Haven, MD Subject: FW: CT scan follow-up                            ----- Message ----- From: Crecencio Mc, MD Sent: 11/20/2016  11:57 AM To: Leone Haven, MD, # Subject: RE: CT scan follow-up                          Justin Andrews,  Your patient no showed for the CT scan. I will have your RN call him to dind out why   Regards,   Deborra Medina, MD    ----- Message ----- From: Leone Haven, MD Sent: 11/15/2016   6:34 PM To: Crecencio Mc, MD Subject: CT scan follow-up                              Hi Helene Kelp,   Thank you for following up on this scan for me. This patient is an 82 yo male with history of melanoma who presented with a several month history of epigastric abdominal pain associated with alternating green/black/brown increased frequency stools. He was tender with guarding on exam. Lipase and WBC were normal. He was minimally anemic. I attempted FOBT in the office though there was no stool in his rectal vault. He has renal dysfunction so I ordered a CT abdomen w/o contrast (his insurance did not approve CT abd/pelvis). Thanks again for following up on this.   Justin Andrews

## 2016-11-21 NOTE — Telephone Encounter (Signed)
Looks like he was seen at [facility] a couple days for this. Without a clear source of abdominal pain it is not safe to send in pain meds as it may mask serious or life threatening symptoms. If he is having a significant worsening of his pain, he needs to be evaluated in the ED.  Algis Greenhouse. Jerline Pain, MD 11/21/2016 4:29 PM

## 2016-11-21 NOTE — Telephone Encounter (Signed)
Tried to phone patient and received message that "mailbox is full and can not accept any messages at this time".  Will try again tomorrow before sending letter.

## 2016-11-22 ENCOUNTER — Telehealth: Payer: Self-pay | Admitting: Family Medicine

## 2016-11-22 ENCOUNTER — Ambulatory Visit: Payer: Self-pay | Admitting: *Deleted

## 2016-11-22 ENCOUNTER — Other Ambulatory Visit: Payer: Self-pay | Admitting: Family Medicine

## 2016-11-22 DIAGNOSIS — R1013 Epigastric pain: Secondary | ICD-10-CM

## 2016-11-22 DIAGNOSIS — H2513 Age-related nuclear cataract, bilateral: Secondary | ICD-10-CM | POA: Diagnosis not present

## 2016-11-22 NOTE — Telephone Encounter (Signed)
Patient is calling for results of CT scan- patient states he is still having severe pain and the Tramadol is doing nothing for him. Call back- [phone]

## 2016-11-22 NOTE — Telephone Encounter (Signed)
Patient was given results per telephone encounter 11/21/16, please about pain

## 2016-11-22 NOTE — Telephone Encounter (Signed)
Niece answered and said no one informed him of a CT so that's why he missed it the first time.  Niece was informed that we have not been able to contact him because we are getting the message that the mailbox is full.  Niece denied that being the case and said he went yesterday for the CT but it was a "mess".  They had to go 3 different places before they got the test.

## 2016-11-22 NOTE — Telephone Encounter (Signed)
See other telephone encounter.

## 2016-11-22 NOTE — Telephone Encounter (Signed)
Please advise 

## 2016-11-22 NOTE — Telephone Encounter (Signed)
Would forward to doc who most recently saw him for this. If he is having severe abdominal pain, he needs to go to the emergency room.   Justin Andrews. Jerline Pain, MD 11/22/2016 5:31 PM

## 2016-11-22 NOTE — Telephone Encounter (Signed)
Left message to return call 

## 2016-11-22 NOTE — Telephone Encounter (Addendum)
I physically escorted the patient to see Rosaria Ferries about moving up/scheduling the CT.  He knew about the plans prior to leaving [facility].  Thanks for trying to help the patient.  At this point, I'll defer to the PCP.  At this point, he has f/u with GI scheduled for next week.  I will defer to the PCP/GI clinic at this point.  Thanks.

## 2016-11-22 NOTE — Telephone Encounter (Signed)
Pt  Had  Ct  Scan  Done   Recently  C/o  Severe  Low  abd  Pain  Vomited   X  [place] notified   Agreed  That  Pt  Go  To  Er  Now   Pt staes  Niece  Will take  Him pt  Advised  Npo      Reason for Disposition . [1] SEVERE pain (e.g., excruciating) AND [2] present > 1 hour  Answer Assessment - Initial Assessment Questions 1. LOCATION: "Where does it hurt?"       Low   abd     2. RADIATION: "Does the pain shoot anywhere else?" (e.g., chest, back)       no 3. ONSET: "When did the pain begin?" (Minutes, hours or days ago)        Pain  X  4-5  dya   Got   Worse  sev   Weeks   Ago   4. SUDDEN: "Gradual or sudden onset?"       Gradual  5. PATTERN "Does the pain come and go, or is it constant?"    - If constant: "Is it getting better, staying the same, or worsening?"      (Note: Constant means the pain never goes away completely; most serious pain is constant and it progresses)     - If intermittent: "How long does it last?" "Do you have pain now?"     (Note: Intermittent means the pain goes away completely between bouts)      constant 6. SEVERITY: "How bad is the pain?"  (e.g., Scale 1-10; mild, moderate, or severe)    - MILD (1-3): doesn't interfere with normal activities, abdomen soft and not tender to touch     - MODERATE (4-7): interferes with normal activities or awakens from sleep, tender to touch     - SEVERE (8-10): excruciating pain, doubled over, unable to do any normal activities       Severe   7. RECURRENT SYMPTOM: "Have you ever had this type of abdominal pain before?" If so, ask: "When was the last time?" and "What happened that time?"     Getting   Worse  After  3  Months    8. CAUSE: "What do you think is causing the abdominal pain?"      Unknown 9. RELIEVING/AGGRAVATING FACTORS: "What makes it better or worse?" (e.g., movement, antacids, bowel movement)    Movement   Decreased   Appetite     10. OTHER SYMPTOMS: "Has there been any vomiting,  diarrhea, constipation, or urine problems?"      Diarrhea   Vomited   X  1  Today  Protocols used: ABDOMINAL PAIN - MALE-A-AH

## 2016-11-25 NOTE — Telephone Encounter (Signed)
Patient was triaged and informed to go to ER

## 2016-11-26 ENCOUNTER — Ambulatory Visit: Payer: Medicare HMO | Admitting: Gastroenterology

## 2016-12-02 ENCOUNTER — Other Ambulatory Visit: Payer: Self-pay

## 2016-12-02 ENCOUNTER — Encounter: Payer: Self-pay | Admitting: Gastroenterology

## 2016-12-02 ENCOUNTER — Ambulatory Visit (INDEPENDENT_AMBULATORY_CARE_PROVIDER_SITE_OTHER): Payer: Medicare HMO | Admitting: Gastroenterology

## 2016-12-02 VITALS — BP 146/63 | HR 89 | Temp 98.5°F | Ht 67.0 in | Wt 141.2 lb

## 2016-12-02 DIAGNOSIS — R1013 Epigastric pain: Secondary | ICD-10-CM | POA: Diagnosis not present

## 2016-12-02 DIAGNOSIS — R634 Abnormal weight loss: Secondary | ICD-10-CM | POA: Diagnosis not present

## 2016-12-02 NOTE — Progress Notes (Signed)
Vonda Antigua, MD [address], [address], [place], [place], [address] [id number], [place], [place], [place], [address] Phone: [phone]  Fax: [phone]  Consultation  Referring Provider:     Leone Haven, MD Primary Care Physician:  Leone Haven, MD Primary Gastroenterologist:  Virgel Manifold, MD        Reason for Consultation:     Abdominal Pain, weight loss  Date of Consultation:  12/02/2016         HPI:   Justin Andrews is a 82 y.o. male with 70-month history of diffuse abdominal pain, constant, cramping, 5/10, with no radiation, associated with nausea vomiting and weight loss presents for initial visit.  Pain does not vary with eating.  Reports loss of appetite since the pain started.  There is documented 30-40 pound weight loss since the last 6 months.  Reports emesis consisted of food at times.  Denies any hematemesis.  Emesis occurs 2-3 days.  Usually occurs in the morning on an empty stomach.  Also reports episodes of food getting stuck in his throat, and sometimes has to bring food back up.  However, attributes this to not having any teeth and not being able to chew his food.  Denies any previous EGDs, reports a colonoscopy 15 years ago, the records of which is not available, patient states this is normal.  Denies any family history of cancer.  Patient reports about 2-3 weeks ago his primary care provider prescribed him Prilosec and since starting that his pain has completely resolved.  He has not had any abdominal pain in 2 weeks, no nausea vomiting since then.  Lab work from November 9 shows hemoglobin at baseline of 11.7, MCV of 97.  Lipase is normal.  CMP shows normal liver enzymes, and elevated creatinine.  CT without contrast on November 15 showed large duodenal diverticulum, along the second portion duodenum with mild inflammation in the groove between the pancreas and the second portion duodenum.  Findings were suggestive to be due to more duodenitis  and pancreatitis on the report.  Thickening of the gastric mucosa in the fundus and body were also reported but are said to be possibly due to nondistention.  Patient denies any altered bowel habits or blood in stool.  He is not on any NSAIDs.  Level was 47 in June 2018, ferritin 22.  Record review in care everywhere shows that patient has had a history of skin cancers and has been noncompliant with follow-up in the past.  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  Patient states he had left earlobe excision since then.  November 2018 labs reviewed as per HPI  Past Medical History:  Diagnosis Date  . History of melanoma   . Hypertension   . Osteoarthritis   . Tobacco abuse     Past Surgical History:  Procedure Laterality Date  . ANKLE FRACTURE SURGERY    . EXTERNAL EAR SURGERY    . ROTATOR CUFF REPAIR    . TONSILLECTOMY    . TYMPANOPLASTY W/ MASTOIDECTOMY     Patient with reports of mastoid surgery (appears to have had surgery on TM; Left).    Prior to Admission medications   Medication Sig Start Date End Date Taking? Authorizing Provider  amLODipine (NORVASC) 5 MG tablet Take 5 mg daily by mouth. 09/07/16  Yes [provider]  carvedilol (COREG) 6.25 MG tablet Take 1 tablet (6.25 mg  total) by mouth 2 (two) times daily with a meal. 10/21/16  Yes Leone Haven, MD  Cyanocobalamin (B-12) 1000 MCG TABS Take 1 tablet by mouth daily. 10/17/16  Yes Leone Haven, MD  ferrous sulfate 325 (65 FE) MG tablet Take 1 tablet (325 mg total) by mouth 2 (two) times daily. 10/17/16  Yes Leone Haven, MD  folic acid (FOLVITE) 1 MG tablet Take 1 tablet (1 mg total) by mouth daily. 10/17/16  Yes Leone Haven, MD  ranitidine (ZANTAC) 150 MG tablet Take 150 mg by mouth 2 (two) times daily.   Yes [provider]  sertraline (ZOLOFT) 50 MG tablet Take 1 tablet (50 mg total) by mouth daily. 09/12/16  Yes Cook, Jayce G, DO   traMADol (ULTRAM) 50 MG tablet Take 50 mg by mouth. 03/06/16  Yes [provider]  traZODone (DESYREL) 50 MG tablet Take 1 tablet (50 mg total) by mouth at bedtime. 08/05/16  Yes Cook, Jayce G, DO  lisinopril (PRINIVIL,ZESTRIL) 20 MG tablet Take 20 mg by mouth daily. 07/11/16   [provider]    Family History  Problem Relation Age of Onset  . Prostate cancer Neg Hx   . Bladder Cancer Neg Hx   . Kidney cancer Neg Hx      Social History   Tobacco Use  . Smoking status: Current Every Day Smoker    Types: Cigarettes  . Smokeless tobacco: Never Used  Substance Use Topics  . Alcohol use: No  . Drug use: No    Allergies as of 12/02/2016 - Review Complete 12/02/2016  Allergen Reaction Noted  . Aspirin Other (See Comments) 09/13/2015    Review of Systems:    All systems reviewed and negative except where noted in HPI.   Physical Exam:  Vital signs in last 24 hours: Vitals:   12/02/16 1032  BP: (!) 146/63  Pulse: 89  Temp: 98.5 F (36.9 C)  TempSrc: Oral  Weight: 64 kg (141 lb 3.2 oz)  Height: 5\' 7"  (1.702 m)     General:   Pleasant, cooperative in NAD Head:  Normocephalic and atraumatic. Eyes:   No icterus.   Conjunctiva pink. PERRLA. Ears:  Normal auditory acuity. Neck:  Supple; no masses or thyroidomegaly Lungs: Respirations even and unlabored. Lungs clear to auscultation bilaterally.   No wheezes, crackles, or rhonchi.  Heart:  Regular rate and rhythm;  Without murmur, clicks, rubs or gallops Abdomen:  Soft, nondistended, nontender. Normal bowel sounds. No appreciable masses or hepatomegaly.  No rebound or guarding.  Neurologic:  Alert and oriented x3;  grossly normal neurologically. Skin:  Intact without significant lesions or rashes. Cervical Nodes:  No significant cervical adenopathy. Psych:  Alert and cooperative. Normal affect.  LAB RESULTS: No results for input(s): WBC, HGB, HCT, PLT in the last 72 hours. BMET No results for input(s): NA,  K, CL, CO2, GLUCOSE, BUN, CREATININE, CALCIUM in the last 72 hours. LFT No results for input(s): PROT, ALBUMIN, AST, ALT, ALKPHOS, BILITOT, BILIDIR, IBILI in the last 72 hours. PT/INR No results for input(s): LABPROT, INR in the last 72 hours.  November 2018 labs reviewed as per HPI STUDIES: No results found. CT scan results reviewed and images reviewed   Impression / Plan:   Justin Andrews is a 82 y.o. y/o male with 49-month history of abdominal pain and 30 pound weight loss with CT scan showing inflammation between the pancreas and second portion of duodenum, with 4 cm duodenal diverticulum as  well  Clinical Findings concerning for malignancy  CT findings will need evaluation with EGD.  The study was without contrast and the findings are those limited, inflammation noted could represent a mass.  The presence of a duodenal diverticulum can also alter findings. Lipase is normal, and patient denies daily alcohol use, findings not consistent with pancreatitis Nevertheless, patient was asked to quit alcohol use for the time being.  He reports drinking 1 pint of hard liquor once a week. He reports his abdominal pain, nausea vomiting have completely resolved over the last 2 weeks since he was started on PPI by his PCP.   If his EGD is negative, he will need colonoscopy subsequently after medical optimization  PCP results note, indicate that they were considering referral to nephrology due to his elevated creatinine.  Would recommend PCP to continue with this referral and renal workup for elevated creatinine. Above plan discussed in detail with patient and he is agreeable to same. I have discussed alternative options, risks & benefits,  which include, but are not limited to, bleeding, infection, perforation,respiratory complication & drug reaction.  The patient agrees with this plan & written consent will be obtained.    If his abdominal pain recurs or worsens, he is to contact us or go to ER.  He  verbalized understanding of the same.  Thank you for involving me in the care of this patient.     Virgel Manifold, MD  12/02/2016, 11:06 AM

## 2016-12-03 ENCOUNTER — Other Ambulatory Visit: Payer: Self-pay | Admitting: Family Medicine

## 2016-12-03 ENCOUNTER — Telehealth: Payer: Self-pay

## 2016-12-03 ENCOUNTER — Ambulatory Visit: Payer: Medicare HMO

## 2016-12-03 DIAGNOSIS — N183 Chronic kidney disease, stage 3 unspecified: Secondary | ICD-10-CM

## 2016-12-03 NOTE — Telephone Encounter (Signed)
-----   Message from Leone Haven, MD sent at 12/03/2016 12:34 PM EST ----- Regarding: nephrology referral Please let the patient know that I placed a referral to nephrology for his chronic kidney dysfunction. Thanks. Eric.

## 2016-12-03 NOTE — Telephone Encounter (Signed)
Patients niece notified

## 2016-12-05 ENCOUNTER — Ambulatory Visit: Payer: Medicare HMO | Admitting: Family Medicine

## 2016-12-10 ENCOUNTER — Encounter: Payer: Self-pay | Admitting: Anesthesiology

## 2016-12-12 ENCOUNTER — Other Ambulatory Visit: Payer: Self-pay

## 2016-12-12 DIAGNOSIS — R634 Abnormal weight loss: Secondary | ICD-10-CM

## 2016-12-17 ENCOUNTER — Telehealth: Payer: Self-pay | Admitting: Gastroenterology

## 2016-12-17 ENCOUNTER — Ambulatory Visit: Admission: RE | Admit: 2016-12-17 | Payer: Medicare HMO | Source: Ambulatory Visit | Admitting: Gastroenterology

## 2016-12-17 HISTORY — DX: Other specified abnormal findings of blood chemistry: R79.89

## 2016-12-17 SURGERY — ESOPHAGOGASTRODUODENOSCOPY (EGD) WITH PROPOFOL
Anesthesia: Choice

## 2016-12-17 NOTE — Telephone Encounter (Signed)
Patients niece called and stated patient did not want to have procedure done at all. Please cancel this appt

## 2016-12-18 NOTE — Telephone Encounter (Signed)
Contacted [facility] to cancel pt appt.

## 2016-12-19 ENCOUNTER — Ambulatory Visit: Admission: RE | Admit: 2016-12-19 | Payer: Medicare HMO | Source: Ambulatory Visit | Admitting: Gastroenterology

## 2016-12-19 ENCOUNTER — Encounter: Admission: RE | Payer: Self-pay | Source: Ambulatory Visit

## 2016-12-19 SURGERY — ESOPHAGOGASTRODUODENOSCOPY (EGD) WITH PROPOFOL
Anesthesia: General

## 2017-01-13 ENCOUNTER — Ambulatory Visit: Payer: Self-pay

## 2017-01-13 NOTE — Telephone Encounter (Signed)
Pt. Returned call to office.  Advised of recommendation to go the ER, and that Dr. Caryl Bis will be made aware of his symptoms.  The pt. Stated again, "I won't go to the ER; I'll die right here 1st."  The pt. hung up the phone.

## 2017-01-13 NOTE — Telephone Encounter (Signed)
Pt. called with c/o "constant severe abdominal pain, across lower abdomen."  Reported the pain has been ongoing for 6 mos. - 1 year.  Reported his abdomen feels tight and distended.  Stated he has freq. stools, and reported last stool was yesterday ; described as watery dark green/ black.  Also stated he has nausea, and vomits intermittently.  Reported last emesis was yesterday.  Stated he has only been able to eat about one meal/ day. C/o chills.  Per protocol, advised the pt. should go to the ER.  The pt. stated, "I won't go to the ER.  I'll die in my room before I go to the ER.  I just want to see my doctor, because he knows what is going on with me."  Reported his nerves can't take going, and sitting there for hours, waiting to be seen.   Advised pt. There are no openings in Dr. Ellen Henri sched. This week.  Phone call placed to Animal nutritionist at [facility] [employer].  Informed of the above sx's., and pt's refusal to go to the ER.  Stated she will send message to Dr. Caryl Bis, but there is nothing that the office can offer him. Per Tiffany, Dr. Caryl Bis would want him to go to the ER.     Resumed phone call with pt.  Attempted to explain that I spoke with nurse for Dr. Caryl Bis, and the phone call got dropped.   Attempted to return call to pt.; unable to reach him after multiple attempts; the call goes directly into a message that no voice mail has been set up.                  Reason for Disposition . [1] SEVERE pain AND [2] age > 60  Answer Assessment - Initial Assessment Questions 1. LOCATION: "Where does it hurt?"      Across lower abdomen  2. RADIATION: "Does the pain shoot anywhere else?" (e.g., chest, back)     No 3. ONSET: "When did the pain begin?" (Minutes, hours or days ago)      Has been going on for about a year 4. SUDDEN: "Gradual or sudden onset?"     Been ongoing for 6 mos.  5. PATTERN "Does the pain come and go, or is it constant?"    - If constant: "Is it getting  better, staying the same, or worsening?"      (Note: Constant means the pain never goes away completely; most serious pain is constant and it progresses)     - If intermittent: "How long does it last?" "Do you have pain now?"     (Note: Intermittent means the pain goes away completely between bouts)    Stays steady 6. SEVERITY: "How bad is the pain?"  (e.g., Scale 1-10; mild, moderate, or severe)    - MILD (1-3): doesn't interfere with normal activities, abdomen soft and not tender to touch     - MODERATE (4-7): interferes with normal activities or awakens from sleep, tender to touch     - SEVERE (8-10): excruciating pain, doubled over, unable to do any normal activities       Severe 7. RECURRENT SYMPTOM: "Have you ever had this type of abdominal pain before?" If so, ask: "When was the last time?" and "What happened that time?"      Yes 8. CAUSE: "What do you think is causing the abdominal pain?"     Has colon cancer 9. RELIEVING/AGGRAVATING FACTORS: "What makes it  better or worse?" (e.g., movement, antacids, bowel movement)     Takes Tramadol for the pain; helps for awhile 10. OTHER SYMPTOMS: "Has there been any vomiting, diarrhea, constipation, or urine problems?"       Chills, Diarrhea, constipation, abdominal bloating, nausea; vomiting intermittently; if eats lighter foods, doesn't vomit ; only eats one meal / day.  Protocols used: ABDOMINAL PAIN - MALE-A-AH

## 2017-01-14 NOTE — Telephone Encounter (Addendum)
It ok to place him in at 8:45 on Thursday. If he worsens he needs to go to the ED.

## 2017-01-14 NOTE — Telephone Encounter (Signed)
Patient says that he is not going anywhere. He does not want to see anyone else. He is not going to an urgent care or ED. He has an appointment with you on 02/03/2017. He says he really needs it sooner so he can talk to you (about things unrelated to symptoms.) He says it will not even take him 10 minutes to talk to you about what he needs to discuss and would not give me any further information. Patient stated he needed to speak with you face to face and refuses to discuss. Is there somewhere you would like to schedule him earlier than the 28th?

## 2017-01-14 NOTE — Telephone Encounter (Signed)
I do understand what is going on with him and based on his prior history and severity of symptoms he needs to be seen in the ED for imaging. If he does not want to go to [facility] he could go to [facility] or [facility] in [place].

## 2017-01-14 NOTE — Telephone Encounter (Signed)
Please advise 

## 2017-01-14 NOTE — Telephone Encounter (Signed)
Patient scheduled this Thursday at 8:45am.

## 2017-01-16 ENCOUNTER — Encounter: Payer: Self-pay | Admitting: Family Medicine

## 2017-01-16 ENCOUNTER — Other Ambulatory Visit: Payer: Self-pay | Admitting: Family Medicine

## 2017-01-16 ENCOUNTER — Other Ambulatory Visit: Payer: Self-pay

## 2017-01-16 ENCOUNTER — Ambulatory Visit (INDEPENDENT_AMBULATORY_CARE_PROVIDER_SITE_OTHER): Payer: Medicare HMO | Admitting: Family Medicine

## 2017-01-16 VITALS — BP 170/84 | HR 104 | Temp 98.5°F | Wt 141.8 lb

## 2017-01-16 DIAGNOSIS — R69 Illness, unspecified: Secondary | ICD-10-CM | POA: Diagnosis not present

## 2017-01-16 DIAGNOSIS — F32A Depression, unspecified: Secondary | ICD-10-CM

## 2017-01-16 DIAGNOSIS — I10 Essential (primary) hypertension: Secondary | ICD-10-CM | POA: Diagnosis not present

## 2017-01-16 DIAGNOSIS — R1013 Epigastric pain: Secondary | ICD-10-CM | POA: Diagnosis not present

## 2017-01-16 DIAGNOSIS — R0600 Dyspnea, unspecified: Secondary | ICD-10-CM

## 2017-01-16 DIAGNOSIS — R1011 Right upper quadrant pain: Secondary | ICD-10-CM | POA: Diagnosis not present

## 2017-01-16 DIAGNOSIS — Z85820 Personal history of malignant melanoma of skin: Secondary | ICD-10-CM | POA: Diagnosis not present

## 2017-01-16 DIAGNOSIS — L819 Disorder of pigmentation, unspecified: Secondary | ICD-10-CM | POA: Diagnosis not present

## 2017-01-16 DIAGNOSIS — F329 Major depressive disorder, single episode, unspecified: Secondary | ICD-10-CM

## 2017-01-16 DIAGNOSIS — F419 Anxiety disorder, unspecified: Secondary | ICD-10-CM | POA: Diagnosis not present

## 2017-01-16 DIAGNOSIS — R0609 Other forms of dyspnea: Secondary | ICD-10-CM | POA: Diagnosis not present

## 2017-01-16 LAB — COMPREHENSIVE METABOLIC PANEL
ALT: 11 U/L (ref 0–53)
AST: 19 U/L (ref 0–37)
Albumin: 3.9 g/dL (ref 3.5–5.2)
Alkaline Phosphatase: 58 U/L (ref 39–117)
BUN: 30 mg/dL — ABNORMAL HIGH (ref 6–23)
CALCIUM: 9 mg/dL (ref 8.4–10.5)
CHLORIDE: 103 meq/L (ref 96–112)
CO2: 26 meq/L (ref 19–32)
Creatinine, Ser: 1.71 mg/dL — ABNORMAL HIGH (ref 0.40–1.50)
GFR: 41.04 mL/min — AB (ref >60.00)
GLUCOSE: 107 mg/dL — AB (ref 70–99)
POTASSIUM: 3.6 meq/L (ref 3.5–5.1)
Sodium: 137 mEq/L (ref 135–145)
Total Bilirubin: 0.2 mg/dL (ref 0.2–1.2)
Total Protein: 6.9 g/dL (ref 6.0–8.3)

## 2017-01-16 LAB — CBC WITH DIFFERENTIAL/PLATELET
BASOS ABS: 0.1 10*3/uL (ref 0.0–0.1)
Basophils Relative: 0.8 % (ref 0.0–3.0)
Eosinophils Absolute: 0.2 10*3/uL (ref 0.0–0.7)
Eosinophils Relative: 2.5 % (ref 0.0–5.0)
HCT: 35.7 % — ABNORMAL LOW (ref 39.0–52.0)
Hemoglobin: 11.8 g/dL — ABNORMAL LOW (ref 13.0–17.0)
LYMPHS ABS: 2.6 10*3/uL (ref 0.7–4.0)
Lymphocytes Relative: 27.1 % (ref 12.0–46.0)
MCHC: 33.2 g/dL (ref 30.0–36.0)
MCV: 100.3 fl — AB (ref 78.0–100.0)
MONO ABS: 1.2 10*3/uL — AB (ref 0.1–1.0)
MONOS PCT: 12.7 % — AB (ref 3.0–12.0)
NEUTROS PCT: 56.9 % (ref 43.0–77.0)
Neutro Abs: 5.5 10*3/uL (ref 1.4–7.7)
Platelets: 369 10*3/uL (ref 150.0–400.0)
RBC: 3.55 Mil/uL — AB (ref 4.22–5.81)
RDW: 14.2 % (ref 11.5–15.5)
WBC: 9.7 10*3/uL (ref 4.0–10.5)

## 2017-01-16 LAB — LIPASE: LIPASE: 98 U/L — AB (ref 11.0–59.0)

## 2017-01-16 MED ORDER — AMLODIPINE BESYLATE 5 MG PO TABS: 5.0000 mg | ORAL_TABLET | Freq: Every day | ORAL | 3 refills | Status: DC

## 2017-01-16 MED ORDER — SERTRALINE HCL 100 MG PO TABS: 100.0000 mg | ORAL_TABLET | Freq: Every day | ORAL | 1 refills | Status: DC

## 2017-01-16 MED ORDER — TRAMADOL HCL 50 MG PO TABS: 100.0000 mg | ORAL_TABLET | Freq: Three times a day (TID) | ORAL | 0 refills | Status: DC

## 2017-01-16 NOTE — Assessment & Plan Note (Signed)
Continues to have issues with this.  Did improve with PPI initially.  He has continued to have some discomfort.  No significant change from prior.  He was due to have upper endoscopy to evaluate for a cause of the findings of his CT scan though he canceled this.  He is willing to have this done.  I will have our referral coordinator contact [facility] to get this arranged.  We will check lab work given his tenderness.  Depending on results would consider ED evaluation versus further imaging.  Overall picture is very concerning for malignancy.  If worsens he will go to the emergency room.

## 2017-01-16 NOTE — Progress Notes (Addendum)
[name], MD Phone: [phone]  Justin Andrews is a 83 y.o. male who presents today for follow-up.  Patient continues to have issues with abdominal pain.  He notes it hurts over the middle of his abdomen and is a sharp discomfort.  It has improved somewhat with the use of an over-the-counter PPI.  He has a bowel movement most days.  Some intermittent diarrhea.  Notes stools alternate between black, green, and brown.  No blood in his stool.  No tarry stools.  Some nausea and vomiting.  No blood in his vomit.  He was scheduled for an EGD though canceled this.  He wants to get set back up for that.  Prior CT scan findings were concerning for duodenitis versus mass lesion.  Hypertension: No chest pain.  He takes carvedilol.  No other medications.  Does note some dyspnea on exertion at times where he cannot walk very far and then he will get out of breath.  This has been stable over the last several years.  If he rests it improves.  Does smoke.  He declines evaluation with EKG today.  He is asymptomatic currently.  Patient has a history of melanoma with left earlobe removal.  He has refused dermatology follow-up and the last time he saw them was 10 years ago.  On exam he has new area of hyperpigmentation near his left earlobe.  Patient does note chronic issues with depression.  He notes with all his medical issues going on and the discomfort in his abdomen he does have intermittent thoughts of killing himself.  He notes he wants to live and has no intent or plan to kill himself.  He is currently taking sertraline 50 mg daily.  He would be willing to increase the dose.  Social History   Tobacco Use  Smoking Status Current Every Day Smoker  . Packs/day: 1.00  . Years: 70.00  . Pack years: 70.00  . Types: Cigarettes  Smokeless Tobacco Never Used     ROS see history of present illness  Objective  Physical Exam Vitals:   01/16/17 0845 01/16/17 0914  BP: (!) 180/80 (!) 170/84  Pulse:  (!) 104   Temp: 98.5 F (36.9 C)   SpO2: 97%     BP Readings from Last 3 Encounters:  01/16/17 (!) 170/84  12/02/16 (!) 146/63  11/19/16 (!) 142/84   Wt Readings from Last 3 Encounters:  01/16/17 141 lb 12.8 oz (64.3 kg)  12/02/16 141 lb 3.2 oz (64 kg)  11/19/16 130 lb 12 oz (59.3 kg)    Physical Exam  Constitutional: No distress.  HENT:  Head:    Cardiovascular: Normal rate, regular rhythm and normal heart sounds.  Pulmonary/Chest: Effort normal and breath sounds normal.  Abdominal: Soft. Bowel sounds are normal. He exhibits no distension. There is tenderness (ruq). There is no rebound and no guarding.  Musculoskeletal: He exhibits no edema.  Neurological: He is alert. Gait normal.  Skin: Skin is warm and dry. He is not diaphoretic.     Assessment/Plan: Please see individual problem list.  Hypertension Not at goal.  We will add amlodipine to his regimen.  History of melanoma Current lesion near his left ear is concerning for recurrence of his melanoma.  We will get him into see dermatology and I had our referral coordinator contact [facility] dermatology to try to get him set up.  They will be contacting him to set up an appointment for next week.  He has previously been  resistant to seeing dermatology for follow-up.  Epigastric pain Continues to have issues with this.  Did improve with PPI initially.  He has continued to have some discomfort.  No significant change from prior.  He was due to have upper endoscopy to evaluate for a cause of the findings of his CT scan though he canceled this.  He is willing to have this done.  I will have our referral coordinator contact [facility] to get this arranged.  We will check lab work given his tenderness.  Depending on results would consider ED evaluation versus further imaging.  Overall picture is very concerning for malignancy.  If worsens he will go to the emergency room.  Anxiety and depression Patient with depression.   Uncontrolled.  He does note some intermittent thoughts of killing himself though has no plan or intent to do this and states that he wants to live.  I discussed that if he develops a plan or intent to kill himself he needs to go to the emergency room immediately.  We will increase his Zoloft.  He has fairly quick follow-up with me later this month.  We will need to consider psychiatry referral if not improving.  Given return precautions.  Dyspnea on exertion Patient reports chronic intermittent dyspnea on exertion.  He has risk factors for cardiac issues with his smoking.  Discussed doing an EKG today though he declined this.  He states he has a ride to catch.  He is currently asymptomatic.  We will check a CBC to ensure anemia is not playing a role.  Potentially could be COPD related as well given his smoking history.  Plan for EKG in the future.  Discussed return precautions.   Gorge was seen today for abdominal pain.  Diagnoses and all orders for this visit:  History of melanoma -     Ambulatory referral to Dermatology  Hyperpigmented skin lesion -     Ambulatory referral to Dermatology  RUQ pain -     CBC w/Diff -     Lipase -     Comp Met (CMET)  Essential hypertension  Epigastric pain  Anxiety and depression  Dyspnea on exertion  Other orders -     amLODipine (NORVASC) 5 MG tablet; Take 1 tablet (5 mg total) by mouth daily. -     traMADol (ULTRAM) 50 MG tablet; Take 2 tablets (100 mg total) by mouth every 8 (eight) hours. -     sertraline (ZOLOFT) 100 MG tablet; Take 1 tablet (100 mg total) by mouth daily.    Orders Placed This Encounter  Procedures  . CBC w/Diff  . Lipase  . Comp Met (CMET)  . Ambulatory referral to Dermatology    Referral Priority:   Routine    Referral Type:   Consultation    Referral Reason:   Specialty Services Required    Requested Specialty:   Dermatology    Number of Visits Requested:   1    Meds ordered this encounter  Medications  .  amLODipine (NORVASC) 5 MG tablet    Sig: Take 1 tablet (5 mg total) by mouth daily.    Dispense:  90 tablet    Refill:  3  . traMADol (ULTRAM) 50 MG tablet    Sig: Take 2 tablets (100 mg total) by mouth every 8 (eight) hours.    Dispense:  180 tablet    Refill:  0  . sertraline (ZOLOFT) 100 MG tablet    Sig: Take 1  tablet (100 mg total) by mouth daily.    Dispense:  90 tablet    Refill:  1     [name], MD [facility]

## 2017-01-16 NOTE — Patient Instructions (Addendum)
Nice to see you. We will get you into see dermatology quickly. We will get you into see GI.  We are going to recheck lab work. If you develop chest pain or shortness of breath please go to the emergency room. We are going to place you on amlodipine for your blood pressure.

## 2017-01-16 NOTE — Assessment & Plan Note (Signed)
Current lesion near his left ear is concerning for recurrence of his melanoma.  We will get him into see dermatology and I had our referral coordinator contact [facility] dermatology to try to get him set up.  They will be contacting him to set up an appointment for next week.  He has previously been resistant to seeing dermatology for follow-up.

## 2017-01-16 NOTE — Assessment & Plan Note (Signed)
Patient with depression.  Uncontrolled.  He does note some intermittent thoughts of killing himself though has no plan or intent to do this and states that he wants to live.  I discussed that if he develops a plan or intent to kill himself he needs to go to the emergency room immediately.  We will increase his Zoloft.  He has fairly quick follow-up with me later this month.  We will need to consider psychiatry referral if not improving.  Given return precautions.

## 2017-01-16 NOTE — Assessment & Plan Note (Signed)
Not at goal.  We will add amlodipine to his regimen.

## 2017-01-17 ENCOUNTER — Other Ambulatory Visit: Payer: Self-pay | Admitting: Family Medicine

## 2017-01-17 DIAGNOSIS — R0609 Other forms of dyspnea: Secondary | ICD-10-CM

## 2017-01-17 DIAGNOSIS — R0600 Dyspnea, unspecified: Secondary | ICD-10-CM | POA: Insufficient documentation

## 2017-01-17 DIAGNOSIS — R748 Abnormal levels of other serum enzymes: Secondary | ICD-10-CM

## 2017-01-17 NOTE — Assessment & Plan Note (Signed)
Patient reports chronic intermittent dyspnea on exertion.  He has risk factors for cardiac issues with his smoking.  Discussed doing an EKG today though he declined this.  He states he has a ride to catch.  He is currently asymptomatic.  We will check a CBC to ensure anemia is not playing a role.  Potentially could be COPD related as well given his smoking history.  Plan for EKG in the future.  Discussed return precautions.

## 2017-01-20 ENCOUNTER — Emergency Department
Admission: EM | Admit: 2017-01-20 | Discharge: 2017-01-20 | Discharge status: Against Medical Advice | Payer: Medicare HMO

## 2017-01-20 ENCOUNTER — Other Ambulatory Visit: Payer: Medicare HMO

## 2017-01-20 DIAGNOSIS — R0600 Dyspnea, unspecified: Secondary | ICD-10-CM | POA: Diagnosis not present

## 2017-01-20 NOTE — ED Notes (Signed)
Called in waiting room.  No answer. 

## 2017-01-20 NOTE — ED Triage Notes (Signed)
First Nurse Note:  Neighbor called EMS for c/o SOB for a few days.  BP 176/98.  100 on RA.  Lungs CTA.  Patient states he "can't catch his breath.

## 2017-01-31 ENCOUNTER — Telehealth: Payer: Self-pay

## 2017-01-31 ENCOUNTER — Ambulatory Visit: Payer: Medicare HMO | Admitting: Gastroenterology

## 2017-01-31 NOTE — Telephone Encounter (Signed)
Pt came into office but declined to be seen. He came back with me but said he did not want to see the doctor even after much encouragement to do so. Pt states he lives day to day and is not doing any further testing, etc. He states he is ready to "go".  Pt was actually very friendly and open. I encouraged him to contact our office if he needed anything. Pt was not seen by MD per his request. Pt left with his family.

## 2017-01-31 NOTE — Telephone Encounter (Signed)
Patient spoke to Dr.Sonnenberg

## 2017-01-31 NOTE — Telephone Encounter (Signed)
Noted. I will have Janett Billow try to reach out to the patient to make sure he is not thinking about harming himself.

## 2017-02-02 NOTE — Telephone Encounter (Signed)
Entered in delay.  I spoke with the patient regarding this.  He noted no thoughts of harming himself.  He just does not want to pursue any further evaluation for his abdominal pain.  He is going to end up seeing dermatology and he stated he would keep that appointment for follow-up of his melanoma.

## 2017-02-03 ENCOUNTER — Ambulatory Visit: Payer: Medicare HMO | Admitting: Family Medicine

## 2017-02-04 ENCOUNTER — Other Ambulatory Visit: Payer: Self-pay | Admitting: Family Medicine

## 2017-02-04 DIAGNOSIS — L818 Other specified disorders of pigmentation: Secondary | ICD-10-CM | POA: Diagnosis not present

## 2017-02-04 DIAGNOSIS — C4339 Malignant melanoma of other parts of face: Secondary | ICD-10-CM | POA: Diagnosis not present

## 2017-02-04 DIAGNOSIS — C44319 Basal cell carcinoma of skin of other parts of face: Secondary | ICD-10-CM | POA: Diagnosis not present

## 2017-02-04 DIAGNOSIS — C44329 Squamous cell carcinoma of skin of other parts of face: Secondary | ICD-10-CM | POA: Diagnosis not present

## 2017-02-04 DIAGNOSIS — D485 Neoplasm of uncertain behavior of skin: Secondary | ICD-10-CM | POA: Diagnosis not present

## 2017-02-18 ENCOUNTER — Other Ambulatory Visit: Payer: Self-pay | Admitting: Family Medicine

## 2017-02-28 ENCOUNTER — Encounter: Payer: Self-pay | Admitting: Family Medicine

## 2017-02-28 ENCOUNTER — Other Ambulatory Visit: Payer: Self-pay

## 2017-02-28 ENCOUNTER — Ambulatory Visit (INDEPENDENT_AMBULATORY_CARE_PROVIDER_SITE_OTHER): Payer: Medicare HMO | Admitting: Family Medicine

## 2017-02-28 DIAGNOSIS — R69 Illness, unspecified: Secondary | ICD-10-CM | POA: Diagnosis not present

## 2017-02-28 DIAGNOSIS — C44319 Basal cell carcinoma of skin of other parts of face: Secondary | ICD-10-CM | POA: Diagnosis not present

## 2017-02-28 DIAGNOSIS — F419 Anxiety disorder, unspecified: Secondary | ICD-10-CM

## 2017-02-28 DIAGNOSIS — I10 Essential (primary) hypertension: Secondary | ICD-10-CM

## 2017-02-28 DIAGNOSIS — C4491 Basal cell carcinoma of skin, unspecified: Secondary | ICD-10-CM | POA: Insufficient documentation

## 2017-02-28 DIAGNOSIS — F32A Depression, unspecified: Secondary | ICD-10-CM

## 2017-02-28 DIAGNOSIS — C44320 Squamous cell carcinoma of skin of unspecified parts of face: Secondary | ICD-10-CM

## 2017-02-28 DIAGNOSIS — C4322 Malignant melanoma of left ear and external auricular canal: Secondary | ICD-10-CM | POA: Diagnosis not present

## 2017-02-28 DIAGNOSIS — F329 Major depressive disorder, single episode, unspecified: Secondary | ICD-10-CM

## 2017-02-28 MED ORDER — SERTRALINE HCL 100 MG PO TABS: 150.0000 mg | ORAL_TABLET | Freq: Every day | ORAL | 1 refills | Status: DC

## 2017-02-28 MED ORDER — AMLODIPINE BESYLATE 10 MG PO TABS: 10.0000 mg | ORAL_TABLET | Freq: Every day | ORAL | 3 refills | Status: DC

## 2017-02-28 NOTE — Progress Notes (Signed)
[name], MD Phone: [phone]  Justin Andrews is a 83 y.o. male who presents today for.  Hypertension: Blood pressure elevated today.  Not checking at home.  Taking amlodipine, carvedilol, lisinopril.  No chest pain, shortness of breath, or edema.  He is having issues sleeping.  Feels nervous when he goes to bed.  Taking trazodone and Zoloft.  Cannot get his mind to stop.  He does feel somewhat depressed.  No SI.  He was recently seen by dermatology and had biopsies that revealed 2 basal cell carcinomas, one squamous cell carcinoma, and melanoma.  Dermatology tried to get in touch with him though he was unable to take their call and his voicemail was full.  He notes the lesions have been healing well.  Social History   Tobacco Use  Smoking Status Current Every Day Smoker  . Packs/day: 1.00  . Years: 70.00  . Pack years: 70.00  . Types: Cigarettes  Smokeless Tobacco Never Used     ROS see history of present illness  Objective  Physical Exam Vitals:   02/28/17 1429 02/28/17 1507  BP: (!) 170/68 (!) 160/70  Pulse: 86   Temp: (!) 97.4 F (36.3 C)   SpO2: 95%     BP Readings from Last 3 Encounters:  02/28/17 (!) 160/70  01/16/17 (!) 170/84  12/02/16 (!) 146/63   Wt Readings from Last 3 Encounters:  02/28/17 140 lb 9.6 oz (63.8 kg)  01/16/17 141 lb 12.8 oz (64.3 kg)  12/02/16 141 lb 3.2 oz (64 kg)    Physical Exam  Constitutional: No distress.  HENT:  Head:    Cardiovascular: Normal rate, regular rhythm and normal heart sounds.  Pulmonary/Chest: Effort normal and breath sounds normal.  Musculoskeletal: He exhibits no edema.  Neurological: He is alert. Gait normal.  Skin: Skin is warm and dry. He is not diaphoretic.     Assessment/Plan: Please see individual problem list.  Hypertension Blood pressure is not well controlled today.  The patient left before I was able to advise him what we were changing with his blood pressure medication.  We will  increase his amlodipine to 10 mg and he will continue his other medications.  His AVS will be mailed to him given difficulty getting in touch with him by phone.  Malignant melanoma of left ear [facility]) Patient with recurrence of his melanoma.  Dermatology was unable to get in touch with him.  The patient left before I was able to give him his AVS which had the dermatologists phone number on it.  Patient knows he needs to contact them on Monday.  I will have somebody from our office try to contact him as well.  We are going to mail his AVS as well.  Anxiety and depression Continues to have issues with this.  No SI.  Discussed increasing his Zoloft to 150 mg.  Patient left the office prior to me confirming this with him and giving him his AVS.  His AVS will be mailed to him.   No orders of the defined types were placed in this encounter.   Meds ordered this encounter  Medications  . amLODipine (NORVASC) 10 MG tablet    Sig: Take 1 tablet (10 mg total) by mouth daily.    Dispense:  90 tablet    Refill:  3  . sertraline (ZOLOFT) 100 MG tablet    Sig: Take 1.5 tablets (150 mg total) by mouth daily.    Dispense:  135 tablet  Refill:  [place], MD [place]

## 2017-02-28 NOTE — Patient Instructions (Addendum)
Nice to see you.  We will increase the amlodipine for your BP.  Please call [phone] for the dermatology office.  We will increase your Zoloft to 150 mg daily.

## 2017-02-28 NOTE — Assessment & Plan Note (Signed)
Continues to have issues with this.  No SI.  Discussed increasing his Zoloft to 150 mg.  Patient left the office prior to me confirming this with him and giving him his AVS.  His AVS will be mailed to him.

## 2017-02-28 NOTE — Assessment & Plan Note (Signed)
Blood pressure is not well controlled today.  The patient left before I was able to advise him what we were changing with his blood pressure medication.  We will increase his amlodipine to 10 mg and he will continue his other medications.  His AVS will be mailed to him given difficulty getting in touch with him by phone.

## 2017-02-28 NOTE — Assessment & Plan Note (Signed)
Patient with recurrence of his melanoma.  Dermatology was unable to get in touch with him.  The patient left before I was able to give him his AVS which had the dermatologists phone number on it.  Patient knows he needs to contact them on Monday.  I will have somebody from our office try to contact him as well.  We are going to mail his AVS as well.

## 2017-03-03 ENCOUNTER — Telehealth: Payer: Self-pay

## 2017-03-03 ENCOUNTER — Telehealth: Payer: Self-pay | Admitting: Family Medicine

## 2017-03-03 ENCOUNTER — Other Ambulatory Visit: Payer: Self-pay | Admitting: Family Medicine

## 2017-03-03 MED ORDER — TRAMADOL HCL 50 MG PO TABS: 100.0000 mg | ORAL_TABLET | Freq: Three times a day (TID) | ORAL | 0 refills | Status: DC

## 2017-03-03 NOTE — Telephone Encounter (Signed)
Copied from [place] [id number]. Topic: Quick Communication - Rx Refill/Question >> Mar 03, 2017  4:16 PM Neva Seat wrote: Tramadol   Pt is out of Rx. Please fill asap.  [id number] [address], [place] [address] Phone: [phone] Fax: [phone]

## 2017-03-03 NOTE — Progress Notes (Signed)
pec notified patient

## 2017-03-03 NOTE — Telephone Encounter (Signed)
Noted the Tramadol was refilled by M. Arnett, NP today.

## 2017-03-03 NOTE — Telephone Encounter (Signed)
Patient was seen last week in the office by Dr.Sonnenberg but left before he was done, he thought he was done and ready to go. Patient states Dr.Sonnenberg forgot to give him a new rx for Tramadol. He states he is completely out and would like a refill. [facility] [address]

## 2017-03-03 NOTE — Telephone Encounter (Signed)
Patient came by, Justin Andrews gave after visit summary to patient

## 2017-03-03 NOTE — Telephone Encounter (Signed)
Reviewed prior refills from pcp.   Will refill for patient   I looked up patient on [place] Controlled Substances Reporting System and saw no activity that raised concern of inappropriate use.

## 2017-03-03 NOTE — Telephone Encounter (Signed)
Copied from [place]. Topic: Inquiry >> Mar 03, 2017 10:11 AM Corie Chiquito, [place] wrote: Reason for CRM: Patient would like to come by and pick his after visit summary up. I have informed the patient that it will be mailed out to him but he is insisting that he come by the office due to the fact that there is a prescription on there that he needs. Stated that he will be there before 12. I advised him as well to contact his Dermatologist office. He would like a call back from Dr.Sonnenberg's nurse too. He can be reached at [phone]

## 2017-03-03 NOTE — Telephone Encounter (Signed)
Copied from [place]. Topic: General - Other >> Mar 03, 2017  1:48 PM Darl Householder, RMA wrote: Reason for CRM: patient is requesting a call back form Dr. Ellen Henri CMA concerning face condition

## 2017-03-18 ENCOUNTER — Ambulatory Visit (INDEPENDENT_AMBULATORY_CARE_PROVIDER_SITE_OTHER): Payer: Medicare HMO | Admitting: Family Medicine

## 2017-03-18 ENCOUNTER — Encounter: Payer: Self-pay | Admitting: Family Medicine

## 2017-03-18 VITALS — BP 148/60 | HR 89 | Temp 97.6°F | Ht 67.0 in | Wt 138.8 lb

## 2017-03-18 DIAGNOSIS — F329 Major depressive disorder, single episode, unspecified: Secondary | ICD-10-CM

## 2017-03-18 DIAGNOSIS — F419 Anxiety disorder, unspecified: Secondary | ICD-10-CM

## 2017-03-18 DIAGNOSIS — R69 Illness, unspecified: Secondary | ICD-10-CM | POA: Diagnosis not present

## 2017-03-18 DIAGNOSIS — G479 Sleep disorder, unspecified: Secondary | ICD-10-CM

## 2017-03-18 DIAGNOSIS — R0609 Other forms of dyspnea: Secondary | ICD-10-CM | POA: Diagnosis not present

## 2017-03-18 DIAGNOSIS — I10 Essential (primary) hypertension: Secondary | ICD-10-CM

## 2017-03-18 DIAGNOSIS — C4322 Malignant melanoma of left ear and external auricular canal: Secondary | ICD-10-CM | POA: Diagnosis not present

## 2017-03-18 DIAGNOSIS — R300 Dysuria: Secondary | ICD-10-CM

## 2017-03-18 DIAGNOSIS — R0600 Dyspnea, unspecified: Secondary | ICD-10-CM

## 2017-03-18 DIAGNOSIS — F32A Depression, unspecified: Secondary | ICD-10-CM

## 2017-03-18 LAB — COMPREHENSIVE METABOLIC PANEL
ALK PHOS: 58 U/L (ref 39–117)
ALT: 11 U/L (ref 0–53)
AST: 12 U/L (ref 0–37)
Albumin: 4.1 g/dL (ref 3.5–5.2)
BUN: 23 mg/dL (ref 6–23)
CHLORIDE: 102 meq/L (ref 96–112)
CO2: 26 mEq/L (ref 19–32)
Calcium: 9.7 mg/dL (ref 8.4–10.5)
Creatinine, Ser: 2.32 mg/dL — ABNORMAL HIGH (ref 0.40–1.50)
GFR: 28.85 mL/min — AB (ref >60.00)
Glucose, Bld: 88 mg/dL (ref 70–99)
POTASSIUM: 4.2 meq/L (ref 3.5–5.1)
Sodium: 136 mEq/L (ref 135–145)
TOTAL PROTEIN: 7.5 g/dL (ref 6.0–8.3)
Total Bilirubin: 0.3 mg/dL (ref 0.2–1.2)

## 2017-03-18 LAB — POCT URINALYSIS DIPSTICK
Bilirubin, UA: NEGATIVE
Glucose, UA: NEGATIVE
Ketones, UA: NEGATIVE
LEUKOCYTES UA: NEGATIVE
NITRITE UA: NEGATIVE
SPEC GRAV UA: 1.015 (ref 1.010–1.025)
Urobilinogen, UA: 0.2 E.U./dL
pH, UA: 5.5 (ref 5.0–8.0)

## 2017-03-18 LAB — CBC
HEMATOCRIT: 36.5 % — AB (ref 39.0–52.0)
HEMOGLOBIN: 12.2 g/dL — AB (ref 13.0–17.0)
MCHC: 33.5 g/dL (ref 30.0–36.0)
MCV: 96.8 fl (ref 78.0–100.0)
Platelets: 382 10*3/uL (ref 150.0–400.0)
RBC: 3.77 Mil/uL — ABNORMAL LOW (ref 4.22–5.81)
RDW: 14.3 % (ref 11.5–15.5)
WBC: 7.6 10*3/uL (ref 4.0–10.5)

## 2017-03-18 NOTE — Assessment & Plan Note (Signed)
I suspect this is related to his anxiety and depression though it has not been improving with his current dose of Zoloft.  I advised it could take up to 8 weeks to notice a difference with the increased dose.  In the interim we will consider treatment with Ambien or trazodone.  I will check with our clinical pharmacist to determine if trazodone would be an option given that he is on Zoloft.

## 2017-03-18 NOTE — Assessment & Plan Note (Signed)
Improved.  Continue current regimen

## 2017-03-18 NOTE — Progress Notes (Signed)
[name], MD Phone: [phone]  Justin Andrews is a 83 y.o. male who presents today for f/u.  HYPERTENSION  Disease Monitoring  Home BP Monitoring not checking Chest pain- no    Dyspnea- yes, see below Medications  Compliance-  Taking amlodipine 10 mg, coreg, lisinopril.  Edema- no  Patient notes for some time now he has had dyspnea on exertion though also has dyspnea at rest at times with his anxiety.  Notes the exertion does occasionally make the dyspnea worse.  He notes some orthopnea for several years as well as some PND intermittently.  He has not had a cardiac evaluation previously.  He does smoke though does not have known COPD.  Reports continued issues sleeping.  Cannot fall asleep.  Wakes up numerous times per night.  Only gets 3-4 hours if he gets that.  He does still have some anxiety and depression though it has only been a few weeks since we have increased his Zoloft.  He notes no SI.  No sleep medications in the past.  Feels like he is a "zombie" when he does not get sleep.  No excessive daytime drowsiness.  He notes some dysuria over the last week with some urgency and frequency.  No blood in his urine.  He declines rectal exam.  Melanoma: Patient has not followed up with dermatology since his most recent visit.  He reports he would not want to do surgery though he is going to contact them to see if there are any other options.  He understands the risks of not undergoing treatment for this.    Social History   Tobacco Use  Smoking Status Current Every Day Smoker  . Packs/day: 1.00  . Years: 70.00  . Pack years: 70.00  . Types: Cigarettes  Smokeless Tobacco Never Used     ROS see history of present illness  Objective  Physical Exam Vitals:   03/18/17 1054  BP: (!) 148/60  Pulse: 89  Temp: 97.6 F (36.4 C)  SpO2: 98%    BP Readings from Last 3 Encounters:  03/18/17 (!) 148/60  02/28/17 (!) 160/70  01/16/17 (!) 170/84   Wt Readings from Last 3  Encounters:  03/18/17 138 lb 12.8 oz (63 kg)  02/28/17 140 lb 9.6 oz (63.8 kg)  01/16/17 141 lb 12.8 oz (64.3 kg)    Physical Exam  Constitutional: No distress.  HENT:  Head:    Cardiovascular: Normal rate, regular rhythm and normal heart sounds.  Pulmonary/Chest: Effort normal and breath sounds normal.  Musculoskeletal: He exhibits no edema.  Neurological: He is alert. Gait normal.  Skin: Skin is warm and dry. He is not diaphoretic.   EKG: Sinus rhythm, rate 86, inverted T wave in aVL, otherwise no changes and appears similar to most recent EKG from 2012  Assessment/Plan: Please see individual problem list.  Hypertension Improved.  Continue current regimen.  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[place]) I encouraged him to contact dermatology for follow-up.  Sleep disturbance I suspect this is related to his anxiety and depression though it has not been improving with his current dose of Zoloft.  I advised it could take up to 8 weeks to notice a difference with the increased dose.  In the interim we will consider treatment with Ambien or trazodone.  I will check with our clinical pharmacist to determine if trazodone would be an option given that he is on Zoloft.  Dysuria We will check a urinalysis.  He will likely need  to see urology again.  Dyspnea on exertion Chronic intermittent dyspnea on exertion as well as occasional dyspnea at rest.  No acute symptoms.  He does have risk factors for cardiac issues as well as COPD.  Could be anxiety.  EKG today is reassuring with no acute ischemic changes noted.  We will check lab work as outlined below to evaluate for other causes.  Will refer to cardiology.  He is given return precautions.  Anxiety and depression Seems to be relatively unchanged from prior.  Discussed that it would take some time for the increase Zoloft dose to start to work.  He will continue this.  Given return precautions.   Orders Placed This Encounter  Procedures  .  CBC  . Comp Met (CMET)  . B Nat Peptide  . Ambulatory referral to Cardiology    Referral Priority:   Routine    Referral Type:   Consultation    Referral Reason:   Specialty Services Required    Requested Specialty:   Cardiology    Number of Visits Requested:   1  . POCT Urinalysis Dipstick  . EKG 12-Lead    No orders of the defined types were placed in this encounter.    [name], MD [place]

## 2017-03-18 NOTE — Patient Instructions (Addendum)
Nice to see you. We will check with our pharmacist regarding possible sleep medications. We will check your urine and contact you with the results. We will check other lab work and contact you with results. If you have persistent trouble breathing or you develop chest pain or any new symptoms please be evaluated immediately. If you change your mind regarding treatment for your melanoma please contact your dermatologist.

## 2017-03-18 NOTE — Assessment & Plan Note (Signed)
Seems to be relatively unchanged from prior.  Discussed that it would take some time for the increase Zoloft dose to start to work.  He will continue this.  Given return precautions.

## 2017-03-18 NOTE — Assessment & Plan Note (Signed)
We will check a urinalysis.  He will likely need to see urology again.

## 2017-03-18 NOTE — Assessment & Plan Note (Signed)
Chronic intermittent dyspnea on exertion as well as occasional dyspnea at rest.  No acute symptoms.  He does have risk factors for cardiac issues as well as COPD.  Could be anxiety.  EKG today is reassuring with no acute ischemic changes noted.  We will check lab work as outlined below to evaluate for other causes.  Will refer to cardiology.  He is given return precautions.

## 2017-03-18 NOTE — Assessment & Plan Note (Signed)
I encouraged him to contact dermatology for follow-up.

## 2017-03-19 ENCOUNTER — Other Ambulatory Visit: Payer: Self-pay | Admitting: Family Medicine

## 2017-03-19 ENCOUNTER — Telehealth: Payer: Self-pay | Admitting: Family Medicine

## 2017-03-19 LAB — BRAIN NATRIURETIC PEPTIDE: Brain Natriuretic Peptide: 81 pg/mL (ref <100)

## 2017-03-19 MED ORDER — TRAZODONE HCL 50 MG PO TABS: 25.0000 mg | ORAL_TABLET | Freq: Every evening | ORAL | 3 refills | Status: DC | PRN

## 2017-03-19 NOTE — Telephone Encounter (Signed)
Please let the patient know that I heard back from the pharmacist. She advised it would be ok to try the trazadone. I sent this to his pharmacy. He should let us know after a week or so if this is not helping with his sleep. Thanks.

## 2017-03-19 NOTE — Telephone Encounter (Signed)
Tried calling, no voicemail. [place] for pec to speak to patient about this and lab results and schedule non fasting lab

## 2017-03-19 NOTE — Telephone Encounter (Signed)
-----   Message from Carlean Jews, [facility] sent at 03/18/2017  7:19 PM EDT ----- I'd try trazodone first. Try low-dose at 25 mg qHS, can increase to 50 mg if that doesn't work. OK to give with sertraline at those low doses.   Chrys Racer ----- Message ----- From: Leone Haven, MD Sent: 03/18/2017  12:17 PM To: Carlean Jews, [facility]  Marianne Sofia,   This guy has had issues sleeping for some time. Trouble falling asleep and staying asleep. I think it is anxiety and depression drive and he has been on zoloft, though not enough time on his current dose to notice a difference. He asked about xanax, though I do not want to do that for sleep. Is trazodone ok with the zoloft? I assume there is some risk of serotonin syndrome. If not trazodone, I would consider ambien at a low dose. Thanks.  Randall Hiss

## 2017-03-20 ENCOUNTER — Telehealth: Payer: Self-pay | Admitting: Family Medicine

## 2017-03-20 ENCOUNTER — Other Ambulatory Visit: Payer: Self-pay | Admitting: Family Medicine

## 2017-03-20 DIAGNOSIS — R809 Proteinuria, unspecified: Secondary | ICD-10-CM

## 2017-03-20 DIAGNOSIS — N179 Acute kidney failure, unspecified: Secondary | ICD-10-CM

## 2017-03-20 NOTE — Telephone Encounter (Signed)
Copied from [place] [id number]. Topic: Quick Communication - Rx Refill/Question >> Mar 20, 2017  2:01 PM Carolyn Stare wrote: Medication  traZODone (DESYREL) 50 MG tablet    pt has spoken to pharmacy and they told him they donot have the RX IF [place]     Has the patient contacted their pharmacy  yes          Preferred Pharmacy  [place]: Please be advised that RX refills may take up to 3 business days. We ask that you follow-up with your pharmacy.

## 2017-03-20 NOTE — Telephone Encounter (Signed)
Patient notified and will let us know.

## 2017-03-21 ENCOUNTER — Other Ambulatory Visit: Payer: Self-pay | Admitting: *Deleted

## 2017-03-21 ENCOUNTER — Other Ambulatory Visit: Payer: Medicare HMO

## 2017-03-21 MED ORDER — TRAZODONE HCL 50 MG PO TABS: 25.0000 mg | ORAL_TABLET | Freq: Every evening | ORAL | 3 refills | Status: DC | PRN

## 2017-04-01 ENCOUNTER — Other Ambulatory Visit (INDEPENDENT_AMBULATORY_CARE_PROVIDER_SITE_OTHER): Payer: Medicare HMO

## 2017-04-01 ENCOUNTER — Other Ambulatory Visit: Payer: Self-pay | Admitting: Family

## 2017-04-01 DIAGNOSIS — N179 Acute kidney failure, unspecified: Secondary | ICD-10-CM

## 2017-04-01 DIAGNOSIS — R809 Proteinuria, unspecified: Secondary | ICD-10-CM | POA: Diagnosis not present

## 2017-04-01 DIAGNOSIS — R748 Abnormal levels of other serum enzymes: Secondary | ICD-10-CM

## 2017-04-01 LAB — BASIC METABOLIC PANEL
BUN: 33 mg/dL — AB (ref 6–23)
CALCIUM: 9.5 mg/dL (ref 8.4–10.5)
CO2: 27 meq/L (ref 19–32)
CREATININE: 1.81 mg/dL — AB (ref 0.40–1.50)
Chloride: 106 mEq/L (ref 96–112)
GFR: 38.42 mL/min — ABNORMAL LOW (ref >60.00)
Glucose, Bld: 113 mg/dL — ABNORMAL HIGH (ref 70–99)
Potassium: 4.4 mEq/L (ref 3.5–5.1)
Sodium: 139 mEq/L (ref 135–145)

## 2017-04-01 LAB — URINALYSIS, MICROSCOPIC ONLY
BACTERIA UA: NONE SEEN
RBC / HPF: NONE SEEN (ref 0–?)
WBC, UA: NONE SEEN (ref 0–?)

## 2017-04-01 LAB — LIPASE: Lipase: 38 U/L (ref 11.0–59.0)

## 2017-04-02 ENCOUNTER — Other Ambulatory Visit: Payer: Self-pay | Admitting: Family Medicine

## 2017-04-02 DIAGNOSIS — R809 Proteinuria, unspecified: Secondary | ICD-10-CM | POA: Diagnosis not present

## 2017-04-02 NOTE — Telephone Encounter (Signed)
Copied from [place]. Topic: Quick Communication - Rx Refill/Question >> Apr 02, 2017  9:54 AM Antonieta Iba C wrote: Medication:traMADol (ULTRAM) 50 MG tablet  Has the patient contacted their pharmacy? Yes   (Agent: If no, request that the patient contact the pharmacy for the refill.)  Preferred Pharmacy (with phone number or street name): [id number] [place] [place]: Please be advised that RX refills may take up to 3 business days. We ask that you follow-up with your pharmacy.

## 2017-04-02 NOTE — Addendum Note (Signed)
Addended by: Arby Barrette on: 04/02/2017 02:06 PM   Modules accepted: Orders

## 2017-04-02 NOTE — Telephone Encounter (Signed)
Last filled 03/03/17 Last office visit 03/18/17 No office visit scheduled

## 2017-04-02 NOTE — Telephone Encounter (Signed)
It looks like Dr. Vidal Schwalbe prescribed this last but Dr. Lacinda Axon prescribed it before that. Please advise.

## 2017-04-02 NOTE — Telephone Encounter (Signed)
LOV:03/18/17  Dr. Caryl Bis  [facility] [place]

## 2017-04-03 ENCOUNTER — Other Ambulatory Visit: Payer: Self-pay | Admitting: Family Medicine

## 2017-04-03 ENCOUNTER — Telehealth: Payer: Self-pay | Admitting: Family Medicine

## 2017-04-03 ENCOUNTER — Ambulatory Visit: Payer: Self-pay | Admitting: *Deleted

## 2017-04-03 LAB — UPEP/TP, 24-HR URINE
Albumin, U: 41.8 %
Alpha 1, Urine: 3.6 %
Alpha 2, Urine: 9.9 %
Beta, Urine: 27.1 %
Gamma Globulin, Urine: 17.7 %
PROTEIN 24H UR: 323 mg/(24.h) — AB (ref 30–150)
PROTEIN UR: 14.7 mg/dL

## 2017-04-03 MED ORDER — TRAMADOL HCL 50 MG PO TABS: 100.0000 mg | ORAL_TABLET | Freq: Three times a day (TID) | ORAL | 0 refills | Status: DC

## 2017-04-03 NOTE — Telephone Encounter (Signed)
This is the triage not on the patient requesting tramadol refill I did not realize Dr. Caryl Bis had already left for the day.

## 2017-04-03 NOTE — Telephone Encounter (Signed)
Copied from [place] [id number]. Topic: Quick Communication - Rx Refill/Question >> Apr 03, 2017 11:38 AM Justin Andrews, RMA wrote: MedicationtraMADol (ULTRAM) 50 MG tablet  Has the patient contacted their pharmacy? yes (Agent: If no, request that the patient contact the pharmacy for the refill.) Preferred Pharmacy (with phone number or street name):[id number] Justin Andrews, [place] [phone] (Phone)  Agent: Please be advised that RX refills may take up to 3 business days. We ask that you follow-up with your pharmacy.

## 2017-04-03 NOTE — Telephone Encounter (Signed)
This was first requested after 5:00 pm on Tuesday.  He should have been advised that it could take up to 3 days to get a refill by the PEC.  I will send a refill to his pharmacy.  Please contact him and advise him this has been sent.  If he develops a plan or intent to harm himself he needs to be evaluated in the emergency room.  I have been following him for his depression as well.  He has not verbalized plan or intent to harm himself previously.  I have discussed this with him previously.  Please also advise him in the future that he needs to request a refill prior to running out of his medication.  Thanks.

## 2017-04-03 NOTE — Telephone Encounter (Signed)
He called in checking on his Tramadol refill status.   He mentioned that he is in so much pain that he  Did not want to live  Like this any more so the agent transferred him to me.  When I asked how I could help him he replied,  "I'm just hurting so bad and I'm out of my pain medicine, the Tramadol".   I asked him about wanting to harm himself and he replied,  "I think like that all the time if I'm hurting because the pain is so bad".  "Dr. Caryl Bis knows all about my problem with my pain".    I asked him if he had a plan to harm himself and he replied,  "No I'm not planning to do anything like that I'm just in so much pain I can't stand it from the arthritis".   "I just need my pain medication".   I asked his permission and put him on hold.   I spoke with Juliann Pulse the flow coordinator regarding the refill and she is going to have Dr. Caryl Bis sign the Rx for the Tramadol now and send it to the pharmacy.    I let her know what Justin Andrews had said about harming himself but after I talked with him I felt he is more frustrated and in pain than wanting to harm himself after talking with him.   So the office is sending the [facility] now to the [facility] in [place] [facility] [address].  I then got back on the phone with Justin Andrews.    I let him know Dr. Caryl Bis is signing the RX and getting it sent to the [facility] [address] now.    He thanked me and said that would be such a help.  I then stayed on the phone with him about 5 more minutes talking just to make sure he was ok.   We talked about playing golf this weekend since it's going to be nice weather.   He said he would get his brother to play with him.   "Once this pain is under control I will be fine".   I told him to call the pharmacy before goes down there to make sure they have it ready for him.  He verbalized understanding and was again thanking me for my help.  It meant a lot to him.   "I hope you never have arthritis".   It's the most painful thing".   I let him  know I was glad I could help him and to call us back for any other concerns or needs.   Reason for Disposition . [1] Request for URGENT new prescription or refill of "essential" medication (i.e., likelihood of harm to patient if not taken) AND [2] triager unable to fill per unit policy  Answer Assessment - Initial Assessment Questions 1. SYMPTOMS: "Do you have any symptoms?"     I need my Tramadol.   I'm hurting real bad in my hands, knees and hips from arthritis. 2. SEVERITY: If symptoms are present, ask "Are they mild, moderate or severe?"     He mentioned wanting to hurt himself if he can't get his Tramadol because he is in so much pain.  Protocols used: MEDICATION QUESTION CALL-A-AH

## 2017-04-03 NOTE — Telephone Encounter (Signed)
Tried to return call to patient no answer and voicemail is full cannot leave message call [phone]. Tried to get PCP before leaving for the day but PCP out of office consulted with another provider in office awaiting advice.

## 2017-04-03 NOTE — Telephone Encounter (Signed)
Calling to check status on his pain med refill. Also transferred to NT for suicidal thoughts.

## 2017-04-03 NOTE — Telephone Encounter (Signed)
Copied from [place] [id number]. Topic: Quick Communication - Rx Refill/Question >> Apr 03, 2017 11:38 AM Lolita Rieger, RMA wrote: MedicationtraMADol (ULTRAM) 50 MG tablet  Has the patient contacted their pharmacy? yes (Agent: If no, request that the patient contact the pharmacy for the refill.) Preferred Pharmacy (with phone number or street name):[id number] [address], [place] [phone] (Phone)  Agent: Please be advised that RX refills may take up to 3 business days. We ask that you follow-up with your pharmacy. >> Apr 03, 2017  3:05 PM Cleaster Corin, NT wrote: Pt. Calling back checking on status of med. traMADol (ULTRAM) 50 MG tablet [[id number]]

## 2017-04-03 NOTE — Telephone Encounter (Signed)
Patient has been notified medication has been called to pharmacy ,also advised patient having thoughts of harm he needs to be evaluated, patient stated was just because he is in pain . Patient also wanted to advise PCP he will be seen on 04/08/17 at [facility] for his melanoma.

## 2017-04-03 NOTE — Telephone Encounter (Addendum)
Patient talked with nurse Lucinda at [facility] he is just really frustrated about getting his tramadol refilled please advise ok to refill, PCP is out of office and patient stated he does not know what he will do without his tramadol please can refill  .

## 2017-04-03 NOTE — Telephone Encounter (Signed)
Duplicate message. 

## 2017-04-03 NOTE — Telephone Encounter (Signed)
Patient checking status, is in a lot of pain, [phone]

## 2017-04-03 NOTE — Telephone Encounter (Signed)
Orders pended at office awaiting provider review

## 2017-04-04 NOTE — Telephone Encounter (Signed)
See other note.  Already addressed.

## 2017-04-04 NOTE — Telephone Encounter (Signed)
Noted. Already addressed.

## 2017-04-07 ENCOUNTER — Encounter: Payer: Self-pay | Admitting: *Deleted

## 2017-05-02 ENCOUNTER — Other Ambulatory Visit: Payer: Self-pay | Admitting: Family Medicine

## 2017-05-02 MED ORDER — TRAMADOL HCL 50 MG PO TABS: 100.0000 mg | ORAL_TABLET | Freq: Three times a day (TID) | ORAL | 0 refills | Status: DC

## 2017-05-02 NOTE — Telephone Encounter (Signed)
Last OV 03/18/17 last filled 04/03/17 180 0rf

## 2017-05-02 NOTE — Telephone Encounter (Signed)
Copied from [place]. Topic: Quick Communication - Rx Refill/Question >> May 02, 2017  1:35 PM Scherrie Gerlach wrote: Medication: traMADol (ULTRAM) 50 MG tablet  [id number] [address], [place] [phone] (Phone) [phone] (Fax)  Pt has 2 tabs left

## 2017-05-05 ENCOUNTER — Ambulatory Visit (INDEPENDENT_AMBULATORY_CARE_PROVIDER_SITE_OTHER): Payer: Medicare HMO | Admitting: Family Medicine

## 2017-05-05 ENCOUNTER — Encounter: Payer: Self-pay | Admitting: Family Medicine

## 2017-05-05 ENCOUNTER — Other Ambulatory Visit: Payer: Self-pay

## 2017-05-05 DIAGNOSIS — M150 Primary generalized (osteo)arthritis: Secondary | ICD-10-CM | POA: Diagnosis not present

## 2017-05-05 DIAGNOSIS — C4322 Malignant melanoma of left ear and external auricular canal: Secondary | ICD-10-CM

## 2017-05-05 DIAGNOSIS — M8949 Other hypertrophic osteoarthropathy, multiple sites: Secondary | ICD-10-CM

## 2017-05-05 DIAGNOSIS — J40 Bronchitis, not specified as acute or chronic: Secondary | ICD-10-CM

## 2017-05-05 DIAGNOSIS — R1013 Epigastric pain: Secondary | ICD-10-CM | POA: Diagnosis not present

## 2017-05-05 DIAGNOSIS — M159 Polyosteoarthritis, unspecified: Secondary | ICD-10-CM

## 2017-05-05 MED ORDER — ALBUTEROL SULFATE HFA 108 (90 BASE) MCG/ACT IN AERS: 2.0000 | INHALATION_SPRAY | Freq: Four times a day (QID) | RESPIRATORY_TRACT | 0 refills | Status: DC | PRN

## 2017-05-05 MED ORDER — DOXYCYCLINE HYCLATE 100 MG PO TABS: 100.0000 mg | ORAL_TABLET | Freq: Two times a day (BID) | ORAL | 0 refills | Status: DC

## 2017-05-05 NOTE — Progress Notes (Signed)
[name], MD Phone: [phone]  Justin Andrews is a 83 y.o. male who presents today for f/u.  He notes 3 weeks of cough and chest congestion.  He is coughing up white mucus.  He does note some intermittent wheezing.  No fevers.  No chest pain.  No shortness of breath.  Reports a history of COPD with smoking at least 1 pack/day for 70 years.  He has not taken any medications for this.  He notes no upper respiratory congestion.  No ear symptoms.  He has yet gone as to see dermatology to follow-up on his melanoma for potential treatment.  He has been unable to get a ride to see them.  I will have 1 of our office staff check into options for transportation assistance.  He was provided with the number to contact [facility] to set up a follow-up visit.  He does take tramadol for his arthritis in his hands.  This helps quite well.  Does not make him drowsy.  He reports the abdominal pain he was having previously resolved.  Social History   Tobacco Use  Smoking Status Current Every Day Smoker  . Packs/day: 1.00  . Years: 70.00  . Pack years: 70.00  . Types: Cigarettes  Smokeless Tobacco Never Used     ROS see history of present illness  Objective  Physical Exam Vitals:   05/05/17 1525  BP: (!) 148/78  Pulse: 82  Temp: 97.9 F (36.6 C)  SpO2: 96%    BP Readings from Last 3 Encounters:  05/05/17 (!) 148/78  03/18/17 (!) 148/60  02/28/17 (!) 160/70   Wt Readings from Last 3 Encounters:  05/05/17 136 lb 9.6 oz (62 kg)  03/18/17 138 lb 12.8 oz (63 kg)  02/28/17 140 lb 9.6 oz (63.8 kg)    Physical Exam  Constitutional: No distress.  HENT:  Head: Normocephalic and atraumatic.  Mouth/Throat: Oropharynx is clear and moist. No oropharyngeal exudate.  Left TM with apparent fluid, does not appear purulent, no erythema to the TM, light reflex is intact, right TM is normal  Eyes: Pupils are equal, round, and reactive to light. Conjunctivae are normal.  Cardiovascular: Normal  rate, regular rhythm and normal heart sounds.  Pulmonary/Chest: Effort normal and breath sounds normal.  Abdominal: Soft. Bowel sounds are normal. He exhibits no distension and no mass. There is no tenderness. There is no rebound and no guarding.  Musculoskeletal: He exhibits no edema.  Neurological: He is alert.  Skin: Skin is warm and dry. He is not diaphoretic.  Bilateral hands with multiple joints with  hypertrophy   Assessment/Plan: Please see individual problem list.  Bronchitis Symptoms most consistent with bronchitis.  Possibly on top of COPD though he has no formal diagnosis of this.  We will treat with doxycycline.  He is provided with an inhaler to use as needed.  Malignant melanoma of left ear [facility]) Patient has yet to see dermatology.  He has transportation issues and we will see if there is any assistance that can be provided to him with this.  He was given the number to call to set up an appointment.  He states he will contact them to set this up.  Epigastric pain This has resolved.  Osteoarthritis Continue on tramadol for now.   No orders of the defined types were placed in this encounter.   Meds ordered this encounter  Medications  . doxycycline (VIBRA-TABS) 100 MG tablet    Sig: Take 1 tablet (100 mg  total) by mouth 2 (two) times daily.    Dispense:  14 tablet    Refill:  0  . albuterol (PROVENTIL HFA;VENTOLIN HFA) 108 (90 Base) MCG/ACT inhaler    Sig: Inhale 2 puffs into the lungs every 6 (six) hours as needed for wheezing or shortness of breath.    Dispense:  1 Inhaler    Refill:  0     [name], MD [place]

## 2017-05-05 NOTE — Assessment & Plan Note (Signed)
Continue on tramadol for now.

## 2017-05-05 NOTE — Assessment & Plan Note (Signed)
Patient has yet to see dermatology.  He has transportation issues and we will see if there is any assistance that can be provided to him with this.  He was given the number to call to set up an appointment.  He states he will contact them to set this up.

## 2017-05-05 NOTE — Assessment & Plan Note (Signed)
This has resolved.

## 2017-05-05 NOTE — Patient Instructions (Addendum)
Nice to see you. Please start the doxycycline for your cough and chest congestion.  You can also use the albuterol inhaler as prescribed. Please contact your dermatology office to set up an appointment. This is the number to call [phone]. You develop fever, chest pain, shortness of breath, or any new or changing symptoms please seek medical attention immediately.

## 2017-05-05 NOTE — Assessment & Plan Note (Signed)
Symptoms most consistent with bronchitis.  Possibly on top of COPD though he has no formal diagnosis of this.  We will treat with doxycycline.  He is provided with an inhaler to use as needed.

## 2017-05-12 NOTE — Progress Notes (Deleted)
Cardiology Office Note  Date:  05/12/2017   ID:  Justin Andrews, DOB 1934-06-12, MRN [id number]  PCP:  Leone Haven, MD   No chief complaint on file.   HPI:   hypertension insomnia Prior smoking history Melanoma the ear anxiety Referred by Dr. Caryl Bis for shortness of breath   dyspnea on exertion though also has dyspnea at rest at times with his anxiety.  Notes the exertion does occasionally make the dyspnea worse.  He notes some orthopnea for several years as well as some PND intermittently.  He has not had a cardiac evaluation previously.  He does smoke though does not have known COPD.    PMH:   has a past medical history of Elevated serum creatinine, History of melanoma, Hypertension, Osteoarthritis, and Tobacco abuse.  PSH:    Past Surgical History:  Procedure Laterality Date  . ANKLE FRACTURE SURGERY    . EXTERNAL EAR SURGERY    . ROTATOR CUFF REPAIR    . TONSILLECTOMY    . TYMPANOPLASTY W/ MASTOIDECTOMY     Patient with reports of mastoid surgery (appears to have had surgery on TM; Left).    Current Outpatient Medications  Medication Sig Dispense Refill  . albuterol (PROVENTIL HFA;VENTOLIN HFA) 108 (90 Base) MCG/ACT inhaler Inhale 2 puffs into the lungs every 6 (six) hours as needed for wheezing or shortness of breath. 1 Inhaler 0  . amLODipine (NORVASC) 10 MG tablet Take 1 tablet (10 mg total) by mouth daily. 90 tablet 3  . carvedilol (COREG) 6.25 MG tablet TAKE 1 TABLET (6.25 MG TOTAL) BY MOUTH 2 (TWO) TIMES DAILY WITH A MEAL. 60 tablet 3  . Cyanocobalamin (B-12) 1000 MCG TABS Take 1 tablet by mouth daily. 90 tablet 1  . doxycycline (VIBRA-TABS) 100 MG tablet Take 1 tablet (100 mg total) by mouth 2 (two) times daily. 14 tablet 0  . ferrous sulfate 325 (65 FE) MG tablet Take 1 tablet (325 mg total) by mouth 2 (two) times daily. 086 tablet 1  . folic acid (FOLVITE) 1 MG tablet TAKE 1 TABLET BY MOUTH EVERY DAY 90 tablet 1  . lisinopril (PRINIVIL,ZESTRIL) 20 MG  tablet Take 20 mg by mouth daily.  1  . ranitidine (ZANTAC) 150 MG tablet Take 150 mg by mouth 2 (two) times daily.    . sertraline (ZOLOFT) 100 MG tablet Take 1.5 tablets (150 mg total) by mouth daily. 135 tablet 1  . traMADol (ULTRAM) 50 MG tablet Take 2 tablets (100 mg total) by mouth every 8 (eight) hours. 180 tablet 0  . traZODone (DESYREL) 50 MG tablet Take 0.5 tablets (25 mg total) by mouth at bedtime as needed for sleep. 45 tablet 3   No current facility-administered medications for this visit.      Allergies:   Aspirin   Social History:  The patient  reports that he has been smoking cigarettes.  He has a 70.00 pack-year smoking history. He has never used smokeless tobacco. He reports that he does not drink alcohol or use drugs.   Family History:   family history is not on file.    Review of Systems: ROS   PHYSICAL EXAM: VS:  There were no vitals taken for this visit. , BMI There is no height or weight on file to calculate BMI. GEN: Well nourished, well developed, in no acute distress  HEENT: normal  Neck: no JVD, carotid bruits, or masses Cardiac: RRR; no murmurs, rubs, or gallops,no edema  Respiratory:  clear to  auscultation bilaterally, normal work of breathing GI: soft, nontender, nondistended, + BS MS: no deformity or atrophy  Skin: warm and dry, no rash Neuro:  Strength and sensation are intact Psych: euthymic mood, full affect    Recent Labs: 03/18/2017: ALT 11; Brain Natriuretic Peptide 81; Hemoglobin 12.2; Platelets 382.0 04/01/2017: BUN 33; Creatinine, Ser 1.81; Potassium 4.4; Sodium 139    Lipid Panel Lab Results  Component Value Date   CHOL 146 07/01/2016   HDL 51.60 07/01/2016   LDLCALC 82 07/01/2016   TRIG 65.0 07/01/2016      Wt Readings from Last 3 Encounters:  05/05/17 136 lb 9.6 oz (62 kg)  03/18/17 138 lb 12.8 oz (63 kg)  02/28/17 140 lb 9.6 oz (63.8 kg)       ASSESSMENT AND PLAN:  No diagnosis found.   Disposition:   F/U  6  months  No orders of the defined types were placed in this encounter.    Signed, Esmond Plants, M.D., Ph.D. 05/12/2017  [facility] [place], [place] [phone]

## 2017-05-13 ENCOUNTER — Ambulatory Visit: Payer: Medicare HMO | Admitting: Cardiovascular Disease

## 2017-05-14 ENCOUNTER — Encounter: Payer: Self-pay | Admitting: Cardiovascular Disease

## 2017-05-27 ENCOUNTER — Other Ambulatory Visit: Payer: Self-pay | Admitting: Family Medicine

## 2017-05-27 ENCOUNTER — Telehealth: Payer: Self-pay | Admitting: Family Medicine

## 2017-05-27 NOTE — Telephone Encounter (Signed)
Copied from [place] [phone]. Topic: Referral - Question >> May 27, 2017  1:23 PM Selinda Flavin B, [place] wrote: Reason for CRM: Patient calling and states that he had a referral for the Cardiologist and had an appointment, but had to cancel due to no transportation and cannot get back in touch with them. Would like to know if someone can get in contact with him with the information for the cardiologist information? CB#: [phone]

## 2017-05-27 NOTE — Telephone Encounter (Signed)
Copied from [place] [id number]. Topic: Quick Communication - Rx Refill/Question >> May 27, 2017  1:19 PM Selinda Flavin B, NT wrote: Medication: traMADol (ULTRAM) 50 MG tablet  Cyanocobalamin (B-12) 1000 MCG TABS  Has the patient contacted their pharmacy? Yes.   (Agent: If no, request that the patient contact the pharmacy for the refill.) (Agent: If yes, when and what did the pharmacy advise?)  Preferred Pharmacy (with phone number or street name): [id number] [place], [place]: Please be advised that RX refills may take up to 3 business days. We ask that you follow-up with your pharmacy.

## 2017-05-28 ENCOUNTER — Other Ambulatory Visit: Payer: Self-pay | Admitting: Family Medicine

## 2017-05-28 ENCOUNTER — Telehealth: Payer: Self-pay | Admitting: Family Medicine

## 2017-05-28 MED ORDER — TRAMADOL HCL 50 MG PO TABS: 100.0000 mg | ORAL_TABLET | Freq: Three times a day (TID) | ORAL | 0 refills | Status: DC

## 2017-05-28 MED ORDER — B-12 1000 MCG PO TABS: 1.0000 | ORAL_TABLET | Freq: Every day | ORAL | 1 refills | Status: DC

## 2017-05-28 NOTE — Telephone Encounter (Signed)
Please advise 

## 2017-05-28 NOTE — Telephone Encounter (Signed)
Copied from [place] [id number]. Topic: Quick Communication - See Telephone Encounter >> May 28, 2017  2:36 PM [name], NT wrote: CRM for notification. See Telephone encounter for: 05/28/17. Pt would like Dr. Ellen Henri regarding  his medications.

## 2017-05-28 NOTE — Telephone Encounter (Signed)
Spoke to pt-he said that he had their number but no one answers. I asked him to call them back and if they do not answer to leave a vm and they will return his call.

## 2017-05-28 NOTE — Telephone Encounter (Signed)
Refill for ultram 50 mg tab, LR 05/02/17 for #180 tabs Cyanocobalamin 1000 mcg tab,  LR 10/17/16 for #90 tabs and 1 refill  Dr. Caryl Bis LOV  05/05/17 NOV  11/10/17 [facility]
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
## 2017-05-28 NOTE — Telephone Encounter (Signed)
Sent to pharmacy 

## 2017-05-28 NOTE — Telephone Encounter (Signed)
Pharmacy calling for Dr. Jenny Reichmann to give confirmation that they can fill Tramedol early. Please call back.

## 2017-05-28 NOTE — Telephone Encounter (Signed)
Tramadol LOV: 05/05/17 Last Refill: 05/05/17 PCP: Dr Caryl Bis [facility]acy[facility] [place], [place]  Cyanocobalamin LOV: 05/05/17 Last Refill: 10/17/16 PCP: Dr Caryl Bis [facility] [address] [place], [place]
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
## 2017-06-15 ENCOUNTER — Other Ambulatory Visit: Payer: Self-pay | Admitting: Family Medicine

## 2017-06-17 NOTE — Telephone Encounter (Signed)
Last OV 05/05/17 last filled 03/21/17 45 3rf New rx was sent in on 03/21/17 with new instructions

## 2017-06-18 ENCOUNTER — Encounter: Payer: Self-pay | Admitting: Family Medicine

## 2017-06-18 ENCOUNTER — Ambulatory Visit (INDEPENDENT_AMBULATORY_CARE_PROVIDER_SITE_OTHER): Payer: Medicare HMO

## 2017-06-18 ENCOUNTER — Ambulatory Visit (INDEPENDENT_AMBULATORY_CARE_PROVIDER_SITE_OTHER): Payer: Medicare HMO | Admitting: Family Medicine

## 2017-06-18 VITALS — BP 146/62 | HR 84 | Temp 97.6°F | Wt 132.0 lb

## 2017-06-18 DIAGNOSIS — R0781 Pleurodynia: Secondary | ICD-10-CM | POA: Diagnosis not present

## 2017-06-18 DIAGNOSIS — R350 Frequency of micturition: Secondary | ICD-10-CM | POA: Diagnosis not present

## 2017-06-18 DIAGNOSIS — R300 Dysuria: Secondary | ICD-10-CM

## 2017-06-18 DIAGNOSIS — R809 Proteinuria, unspecified: Secondary | ICD-10-CM | POA: Diagnosis not present

## 2017-06-18 DIAGNOSIS — C4322 Malignant melanoma of left ear and external auricular canal: Secondary | ICD-10-CM

## 2017-06-18 DIAGNOSIS — G8929 Other chronic pain: Secondary | ICD-10-CM

## 2017-06-18 DIAGNOSIS — R1011 Right upper quadrant pain: Secondary | ICD-10-CM | POA: Diagnosis not present

## 2017-06-18 MED ORDER — TRAMADOL HCL 50 MG PO TABS: 100.0000 mg | ORAL_TABLET | Freq: Three times a day (TID) | ORAL | 0 refills | Status: DC

## 2017-06-18 NOTE — Assessment & Plan Note (Signed)
Patient noted previously he would contact dermatology for follow-up though he has not done this.  An appointment was scheduled for him by our referral coordinator while he was in the office and he was given the appointment information.  I discussed the importance of him following through with this.

## 2017-06-18 NOTE — Progress Notes (Signed)
[name], MD Phone: [phone]  Justin Andrews is a 83 y.o. male who presents today for same day visit.  CC: urine frequency  Patient notes a week ago he was peeing every 30 minutes and had dysuria though those things have improved.  Minimal dysuria currently.  No issues with urinary stream.  He is not urinating as much this week.  No urgency.  No fevers.  No suprapubic pain.  No blood in his urine.  Right upper quadrant pain: This has been a chronic issue.  Notes some right lower anterior rib pain as well.  No injury.  This is the same pain he has been having for some time.  GI wanted to do an endoscopy though he did not proceed with that.  He has not followed up with them.  He has a bowel movement daily with hard little balls.  No blood in his stool.  He never saw dermatology for follow-up of his melanoma.  He has a hard time with transportation.  Patient reports he wants to consider going into an assisted living facility.  He has difficulty with transportation and thus this limits his ability to care for himself to some degree.  His niece does help with him.  He eats one meal a day.  He has no money issues.  Social History   Tobacco Use  Smoking Status Current Every Day Smoker  . Packs/day: 1.00  . Years: 70.00  . Pack years: 70.00  . Types: Cigarettes  Smokeless Tobacco Never Used     ROS see history of present illness  Objective  Physical Exam Vitals:   06/18/17 1507 06/18/17 1700  BP: (!) 146/62   Pulse: (!) 110 84  Temp: 97.6 F (36.4 C)   SpO2: 98%     BP Readings from Last 3 Encounters:  06/18/17 (!) 146/62  05/05/17 (!) 148/78  03/18/17 (!) 148/60   Wt Readings from Last 3 Encounters:  06/18/17 132 lb (59.9 kg)  05/05/17 136 lb 9.6 oz (62 kg)  03/18/17 138 lb 12.8 oz (63 kg)    Physical Exam  Constitutional: No distress.  Cardiovascular: Normal rate, regular rhythm and normal heart sounds.  Pulmonary/Chest: Effort normal and breath sounds  normal.  Slight tenderness over anterior right lower ribs with no bony defects  Abdominal: Soft. Bowel sounds are normal. He exhibits no distension. There is tenderness (Right upper quadrant). There is no rebound and no guarding.  Musculoskeletal: He exhibits no edema.  Neurological: He is alert.  Skin: Skin is warm and dry. He is not diaphoretic.     Assessment/Plan: Please see individual problem list.  Malignant melanoma of left ear [facility]) Patient noted previously he would contact dermatology for follow-up though he has not done this.  An appointment was scheduled for him by our referral coordinator while he was in the office and he was given the appointment information.  I discussed the importance of him following through with this.  Chronic right upper quadrant pain This is a chronic intermittent issue.  He needed an EGD though has not had this completed yet.  We will check a CMP.  Will refer back to GI.  We will check an x-ray given his rib tenderness.  He is given return precautions.  Dysuria He was unable to provide a urine for [place] today.  He will return tomorrow to provide this.  We will treat for UTI if indicated by urine studies.  Patient was given the contact number for  a local organization that provides transportation to elderly patients.  I advised him that he needs to contact them to get set up for that.  I also provided the patient with a number of names for assisted living facilities in the area and advised that he or his family would have to arrange for that.  Orders Placed This Encounter  Procedures  . DG Ribs Unilateral Right    Standing Status:   Future    Number of Occurrences:   1    Standing Expiration Date:   08/19/2018    Order Specific Question:   Reason for Exam (SYMPTOM  OR DIAGNOSIS REQUIRED)    Answer:   right lower anterior rib pain and tenderness, no injury    Order Specific Question:   Preferred imaging location?    Answer:   [facility] Specific Question:   Radiology Contrast Protocol - do NOT remove file path    Answer:   [email or web address]  . Comp Met (CMET)  . Ambulatory referral to Nephrology    Referral Priority:   Routine    Referral Type:   Consultation    Referral Reason:   Specialty Services Required    Requested Specialty:   Nephrology    Number of Visits Requested:   1  . Ambulatory referral to Gastroenterology    Referral Priority:   Routine    Referral Type:   Consultation    Referral Reason:   Specialty Services Required    Number of Visits Requested:   1  . POCT Urinalysis Dipstick    Standing Status:   Future    Standing Expiration Date:   07/18/2017    Meds ordered this encounter  Medications  . traMADol (ULTRAM) 50 MG tablet    Sig: Take 2 tablets (100 mg total) by mouth every 8 (eight) hours.    Dispense:  180 tablet    Refill:  0     [name], MD [place]

## 2017-06-18 NOTE — Assessment & Plan Note (Signed)
This is a chronic intermittent issue.  He needed an EGD though has not had this completed yet.  We will check a CMP.  Will refer back to GI.  We will check an x-ray given his rib tenderness.  He is given return precautions.

## 2017-06-18 NOTE — Assessment & Plan Note (Signed)
He was unable to provide a urine for [place] today.  He will return tomorrow to provide this.  We will treat for UTI if indicated by urine studies.

## 2017-06-18 NOTE — Patient Instructions (Addendum)
Nice to see you. We will check your urine and contact you with the results. We will check blood work as well and contact you with those results. We will get you to see GI. We will get an x-ray today. If you have worsening pain or you develop any new symptoms please be evaluated immediately. Please do not take more pain medicine than prescribed.  Some assisted living facilities are listed below. It is up to you to call them.  [facility], [facility], [facility], [facility]

## 2017-06-19 ENCOUNTER — Other Ambulatory Visit: Payer: Self-pay | Admitting: Family Medicine

## 2017-06-19 ENCOUNTER — Telehealth: Payer: Self-pay | Admitting: Family Medicine

## 2017-06-19 ENCOUNTER — Encounter: Payer: Self-pay | Admitting: Family Medicine

## 2017-06-19 DIAGNOSIS — R1011 Right upper quadrant pain: Secondary | ICD-10-CM

## 2017-06-19 LAB — COMPREHENSIVE METABOLIC PANEL
ALBUMIN: 4.1 g/dL (ref 3.5–5.2)
ALT: 82 U/L — AB (ref 0–53)
AST: 182 U/L — ABNORMAL HIGH (ref 0–37)
Alkaline Phosphatase: 88 U/L (ref 39–117)
BILIRUBIN TOTAL: 0.4 mg/dL (ref 0.2–1.2)
BUN: 31 mg/dL — AB (ref 6–23)
CO2: 24 mEq/L (ref 19–32)
CREATININE: 2.22 mg/dL — AB (ref 0.40–1.50)
Calcium: 9 mg/dL (ref 8.4–10.5)
Chloride: 102 mEq/L (ref 96–112)
GFR: 30.34 mL/min — ABNORMAL LOW (ref >60.00)
GLUCOSE: 170 mg/dL — AB (ref 70–99)
POTASSIUM: 3.4 meq/L — AB (ref 3.5–5.1)
SODIUM: 138 meq/L (ref 135–145)
TOTAL PROTEIN: 7.3 g/dL (ref 6.0–8.3)

## 2017-06-19 NOTE — Telephone Encounter (Signed)
Patient has been notified and scheduled for ultra sound.

## 2017-06-19 NOTE — Telephone Encounter (Signed)
FYI

## 2017-06-19 NOTE — Telephone Encounter (Signed)
Please contact the patient and let him know that his liver function tests were elevated.  In combination with his right upper quadrant pain this means he needs an ultrasound of his liver and gallbladder.  I have placed an order for this.  I will check with Melissa to see if we can get this scheduled today.  If his stomach pain has worsened he needs to go to the emergency room.  His kidney function is also slightly worse.  He will need to see a nephrologist.  A referral has previously been placed.  Thanks.

## 2017-06-19 NOTE — Telephone Encounter (Signed)
Justin Beers, RN 38 minutes ago (1:48 PM)     Pt called back and results given from note of Dr Caryl Bis. Pt states that he no longer drives and transportation is an issue. Advised pt set up transportation with someone so he can get his ultrasound.  Pt given results per notes of Dr Caryl Bis on 6/13/19Unable to document in result note due to result note not being routed to [facility].

## 2017-06-19 NOTE — Telephone Encounter (Signed)
Tried calling, no voicemail, ok for pec to speak to patient about message below and also his result note from 06/19/17

## 2017-06-19 NOTE — Telephone Encounter (Signed)
Pt called back and results given from note of Dr Caryl Bis. Pt states that he no longer drives and transportation is an issue. Advised pt set up transportation with someone so he can get his ultrasound.  Pt given results per notes of Dr Caryl Bis on 6/13/19Unable to document in result note due to result note not being routed to [facility].       Leone Haven, MD       06/19/17 1:16 PM  Note    Please contact the patient and let him know that his liver function tests were elevated.  In combination with his right upper quadrant pain this means he needs an ultrasound of his liver and gallbladder.  I have placed an order for this.  I will check with Melissa to see if we can get this scheduled today.  If his stomach pain has worsened he needs to go to the emergency room.  His kidney function is also slightly worse.  He will need to see a nephrologist.  A referral has previously been placed.  Thanks

## 2017-06-19 NOTE — Telephone Encounter (Signed)
Pt scheduled for 6/14 for ultrasound @ 9:00 am at [place]. He is aware of the appointment. Call was sent to [place] for results.

## 2017-06-20 ENCOUNTER — Ambulatory Visit
Admission: RE | Admit: 2017-06-20 | Discharge: 2017-06-20 | Disposition: A | Discharge status: Home / Self Care | Payer: Medicare HMO | Source: Ambulatory Visit | Attending: Family Medicine | Admitting: Family Medicine

## 2017-06-20 ENCOUNTER — Encounter: Payer: Self-pay | Admitting: Gastroenterology

## 2017-06-20 ENCOUNTER — Other Ambulatory Visit: Payer: Self-pay

## 2017-06-20 DIAGNOSIS — N183 Chronic kidney disease, stage 3 unspecified: Secondary | ICD-10-CM

## 2017-06-20 DIAGNOSIS — R1011 Right upper quadrant pain: Secondary | ICD-10-CM | POA: Diagnosis not present

## 2017-06-20 DIAGNOSIS — K829 Disease of gallbladder, unspecified: Secondary | ICD-10-CM | POA: Diagnosis not present

## 2017-06-23 ENCOUNTER — Other Ambulatory Visit: Payer: Self-pay | Admitting: Family Medicine

## 2017-06-24 ENCOUNTER — Other Ambulatory Visit: Payer: Medicare HMO

## 2017-07-11 ENCOUNTER — Ambulatory Visit: Payer: Medicare HMO

## 2017-07-15 ENCOUNTER — Ambulatory Visit (INDEPENDENT_AMBULATORY_CARE_PROVIDER_SITE_OTHER): Payer: Medicare HMO

## 2017-07-15 VITALS — BP 136/72 | HR 87 | Temp 98.0°F | Resp 16 | Ht 67.0 in | Wt 135.8 lb

## 2017-07-15 DIAGNOSIS — Z0000 Encounter for general adult medical examination without abnormal findings: Secondary | ICD-10-CM | POA: Diagnosis not present

## 2017-07-15 NOTE — Patient Instructions (Addendum)
  Justin Andrews , Thank you for taking time to come for your Medicare Wellness Visit. I appreciate your ongoing commitment to your health goals. Please review the following plan we discussed and let me know if I can assist you in the future.   These are the goals we discussed: Goals    . Healthy Lifestyle     Walk for exercise Eat a healthy diet Stay hydrated        This is a list of the screening recommended for you and due dates:  Health Maintenance  Topic Date Due  . Pneumonia vaccines (1 of 2 - PCV13) 07/16/2018*  . Flu Shot  08/07/2017  . Tetanus Vaccine  12/08/2025  *Topic was postponed. The date shown is not the original due date.

## 2017-07-15 NOTE — Progress Notes (Signed)
Subjective:   Justin Andrews is a 83 y.o. male who presents for an Initial Medicare Annual Wellness Visit.  Review of Systems   Cardiac Risk Factors include: advanced age (>6men, >58 women);hypertension;male gender;smoking/ tobacco exposure No ROS.  Medicare Wellness Visit. Additional risk factors are reflected in the social history.    Objective:    Today's Vitals   07/15/17 0955 07/15/17 0956  BP:  136/72  Pulse:  87  Resp:  16  Temp:  98 F (36.7 C)  TempSrc:  Oral  SpO2:  97%  Weight:  135 lb 12.8 oz (61.6 kg)  Height:  5\' 7"  (1.702 m)  PainSc: 8     Body mass index is 21.27 kg/m.  Advanced Directives 07/15/2017 12/18/2015 12/09/2015 10/21/2014  Does Patient Have a Medical Advance Directive? No No No No  Would patient like information on creating a medical advance directive? No - Patient declined Yes (ED - Information included in AVS) - -    Current Medications (verified) Outpatient Encounter Medications as of 07/15/2017  Medication Sig  . amLODipine (NORVASC) 10 MG tablet Take 1 tablet (10 mg total) by mouth daily.  . carvedilol (COREG) 6.25 MG tablet TAKE 1 TABLET (6.25 MG TOTAL) BY MOUTH 2 (TWO) TIMES DAILY WITH A MEAL.  Marland Kitchen Cyanocobalamin (B-12) 1000 MCG TABS Take 1 tablet by mouth daily.  . ferrous sulfate 325 (65 FE) MG tablet Take 1 tablet (325 mg total) by mouth 2 (two) times daily.  . folic acid (FOLVITE) 1 MG tablet TAKE 1 TABLET BY MOUTH EVERY DAY  . lisinopril (PRINIVIL,ZESTRIL) 20 MG tablet Take 20 mg by mouth daily.  . ranitidine (ZANTAC) 150 MG tablet Take 150 mg by mouth 2 (two) times daily.  . sertraline (ZOLOFT) 100 MG tablet TAKE 1.5 TABLETS (150 MG TOTAL) BY MOUTH DAILY.  . traMADol (ULTRAM) 50 MG tablet Take 2 tablets (100 mg total) by mouth every 8 (eight) hours.  . traZODone (DESYREL) 50 MG tablet Take 0.5 tablets (25 mg total) by mouth at bedtime as needed for sleep.   No facility-administered encounter medications on file as of 07/15/2017.      Allergies (verified) Aspirin   History: Past Medical History:  Diagnosis Date  . Elevated serum creatinine   . History of melanoma   . Hypertension   . Osteoarthritis   . Tobacco abuse    Past Surgical History:  Procedure Laterality Date  . ANKLE FRACTURE SURGERY    . EXTERNAL EAR SURGERY    . ROTATOR CUFF REPAIR    . TONSILLECTOMY    . TYMPANOPLASTY W/ MASTOIDECTOMY     Patient with reports of mastoid surgery (appears to have had surgery on TM; Left).   Family History  Problem Relation Age of Onset  . Prostate cancer Neg Hx   . Bladder Cancer Neg Hx   . Kidney cancer Neg Hx    Social History   Socioeconomic History  . Marital status: Divorced    Spouse name: Not on file  . Number of children: Not on file  . Years of education: Not on file  . Highest education level: Not on file  Occupational History  . Not on file  Social Needs  . Financial resource strain: Not hard at all  . Food insecurity:    Worry: Never true    Inability: Never true  . Transportation needs:    Medical: No    Non-medical: No  Tobacco Use  . Smoking status: Current  Every Day Smoker    Packs/day: 1.00    Years: 70.00    Pack years: 70.00    Types: Cigarettes  . Smokeless tobacco: Never Used  Substance and Sexual Activity  . Alcohol use: No  . Drug use: No  . Sexual activity: Never  Lifestyle  . Physical activity:    Days per week: 1 day    Minutes per session: 90 min  . Stress: Not at all  Relationships  . Social connections:    Talks on phone: Not on file    Gets together: Not on file    Attends religious service: Not on file    Active member of club or organization: Not on file    Attends meetings of clubs or organizations: Not on file    Relationship status: Not on file  Other Topics Concern  . Not on file  Social History Narrative  . Not on file   Tobacco Counseling Ready to quit: Not Answered Counseling given: Not Answered   Clinical Intake:  Pre-visit  preparation completed: Yes  Pain : 0-10 Pain Score: 8  Pain Type: Chronic pain Pain Radiating Towards: Joint pain Pain Relieving Factors: Medication as needed Effect of Pain on Daily Activities: Self pace  Pain Relieving Factors: Medication as needed  Nutritional Status: BMI of 19-24  Normal Diabetes: No  How often do you need to have someone help you when you read instructions, pamphlets, or other written materials from your doctor or pharmacy?: 1 - Never  Interpreter Needed?: No     Activities of Daily Living In your present state of health, do you have any difficulty performing the following activities: 07/15/2017  Hearing? Y  Comment HOH, L ear.   Vision? Y  Comment Difficulty seeing long distances.  He does not wear glasses.   Difficulty concentrating or making decisions? N  Walking or climbing stairs? N  Dressing or bathing? N  Doing errands, shopping? Y  Comment He does not Physiological scientist and eating ? N  Using the Toilet? N  In the past six months, have you accidently leaked urine? N  Do you have problems with loss of bowel control? N  Managing your Medications? N  Managing your Finances? N  Housekeeping or managing your Housekeeping? N  Some recent data might be hidden     Immunizations and Health Maintenance Immunization History  Administered Date(s) Administered  . Influenza, High Dose Seasonal PF 09/12/2016  . Tdap 12/09/2015   There are no preventive care reminders to display for this patient.  Patient Care Team: Leone Haven, MD as PCP - General (Family Medicine)  Indicate any recent Medical Services you may have received from other than [facility] providers in the past year (date may be approximate).    Assessment:   This is a routine wellness examination for Justin Andrews.  The goal of the wellness visit is to assist the patient how to close the gaps in care and create a preventative care plan for the patient.   The roster of all physicians  providing medical care to patient is listed in the Snapshot section of the chart.  Taking calcium VIT D as appropriate/Osteoporosis risk reviewed.    Safety issues reviewed; Smoke and carbon monoxide detectors in the home. Ffirearms locked in a safe within the home. Wears seatbelts when riding with others. No violence in the home.  They do not have excessive sun exposure.  Discussed the need for sun protection: hats, long sleeves  and the use of sunscreen if there is significant sun exposure.  Patient is alert, normal appearance, oriented to person/place/and time. Correctly identified the president of the [place] and recalls of 2/3 words.Performs simple calculations and can read correct time from watch face. Displays appropriate judgement.  No new identified risk were noted.  No failures at ADL's or IADL's.    BMI- discussed the importance of a healthy diet, water intake and the benefits of aerobic exercise. Educational material provided.   24 hour diet recall: Regular diet  Dental- dentures.   Eye- Visual acuity not assessed per patient preference since they have regular follow up with the ophthalmologist.  Wears corrective lenses.  Prevnar 13 vaccine declined.   Health maintenance gaps- closed.  Patient Concerns: Requests Tramadol print script before leaving town next Thursday (7/18). He will not return to town before the refill is due and [facility] [place] Please notify if agreeable to prescription being left at the front desk for pick up.   Hearing/Vision screen Hearing Screening Comments: Followed by [facility] Difficulty hearing; Reports history of melanoma, L ear He does not wear hearing aids  Vision Screening Comments: Followed by [facility] Annual visits   Dietary issues and exercise activities discussed: Current Exercise Habits: Home exercise routine, Type of exercise: walking(golf), Time (Minutes): > 60(3-4 hours), Frequency (Times/Week): 1,  Weekly Exercise (Minutes/Week): 0, Intensity: Mild  Goals    . Healthy Lifestyle     Walk for exercise Eat a healthy diet Stay hydrated       Depression Screen PHQ 2/9 Scores 07/15/2017 08/05/2016  PHQ - 2 Score 1 6  PHQ- 9 Score - 18    Fall Risk Fall Risk  07/15/2017 08/05/2016 07/23/2016  Falls in the past year? No No Yes  Number falls in past yr: - - 1  Injury with Fall? - - No   Cognitive Function:     6CIT Screen 07/15/2017  What Year? 0 points  What month? 0 points  What time? 0 points  Count back from 20 0 points  Months in reverse 0 points  Repeat phrase 0 points  Total Score 0    Screening Tests Health Maintenance  Topic Date Due  . PNA vac Low Risk Adult (1 of 2 - PCV13) 07/16/2018 (Originally 01/23/2001)  . INFLUENZA VACCINE  08/07/2017  . TETANUS/TDAP  12/08/2025      Plan:   End of life planning; Advanced aging; Advanced directives discussed.  No HCPOA/Living Will.  Additional information declined at this time.  I have personally reviewed and noted the following in the patient's chart:   . Medical and social history . Use of alcohol, tobacco or illicit drugs  . Current medications and supplements . Functional ability and status . Nutritional status . Physical activity . Advanced directives . List of other physicians . Hospitalizations, surgeries, and ER visits in previous 12 months . Vitals . Screenings to include cognitive, depression, and falls . Referrals and appointments  In addition, I have reviewed and discussed with patient certain preventive protocols, quality metrics, and best practice recommendations. A written personalized care plan for preventive services as well as general preventive health recommendations were provided to patient.     Varney Biles, LPN   04/14/5460

## 2017-07-16 MED ORDER — TRAMADOL HCL 50 MG PO TABS: 100.0000 mg | ORAL_TABLET | Freq: Three times a day (TID) | ORAL | 0 refills | Status: DC

## 2017-07-17 ENCOUNTER — Other Ambulatory Visit: Payer: Self-pay | Admitting: Family Medicine

## 2017-07-23 ENCOUNTER — Telehealth: Payer: Self-pay | Admitting: Family Medicine

## 2017-07-23 NOTE — Telephone Encounter (Unsigned)
Copied from [place] [id number]. Topic: Quick Communication - See Telephone Encounter >> Jul 23, 2017 10:20 AM Marja Kays F wrote: Pt is calling to see what can be done [facility] [address] [facility] of [facility] tramadol Rx which is the pharmacy is was sent to they have the rx but cant fill it and the patient is Needing a new rx so that he can take it to a new pharmacy   Best number [phone]

## 2017-07-23 NOTE — Telephone Encounter (Signed)
Patient is needing a printed prescription of Tramadol to pick up, so he can take it to another pharmacy. His current pharmacy that it was sent to is out of the medication Tramadol and unable to fill the prescription they have.  Best number to reach patient: [phone]

## 2017-07-24 NOTE — Telephone Encounter (Signed)
Called pharmacy and they did verify that they are out of tramadol however the patient picked up his prescription of [facility] #[facility] This message was send before he received his medication

## 2017-08-06 ENCOUNTER — Ambulatory Visit: Payer: Self-pay | Admitting: *Deleted

## 2017-08-06 ENCOUNTER — Telehealth: Payer: Self-pay

## 2017-08-06 NOTE — Telephone Encounter (Signed)
Discussed with Juliann Pulse.  Given suicidal thoughts he needs to be evaluated immediately.  Advised ED evaluation though he refused.  [place] police contacted to evaluate the patient to determine need for transport to ED for evaluation.

## 2017-08-06 NOTE — Telephone Encounter (Signed)
Pt calling expressing that he is having suicidal thoughts. Pt states he has suicidal thoughts everyday and Dr. Caryl Bis is aware. Pt states he feels like "he doesn't have anything to live for or anything to look forward to". Pt states he does not have a plan and has not tried to harm himself recently. Pt states if he wanted to harm himself he would not have a plan he would just do it. Pt advised to call 911 if he had suicidal thoughts and had a plan of harming himself. Per protocol pt would need to be seen by PCP within 24 hours,Flow coordinator contacted to see if pt would able to be worked in for appointment. Pt can be contacted at [phone] Reason for Disposition . Sometimes has thoughts of suicide  Answer Assessment - Initial Assessment Questions 1. CONCERN: "What happened that made you call today?"      Just needed to see and talk to Dr. Caryl Bis about situation and medications 2. SUICIDE ATTEMPT: "Have you tried to harm yourself recently?"  "Have you ever tried to harm yourself before?"     No I think about it but I havent tried 3. RISK OF HARM - SUICIDAL IDEATION:  "Do you ever have thoughts of hurting or killing yourself?"  (e.g., yes, no, no but preoccupation with thoughts about death)   - INTENT:  "Do you have thoughts of hurting or killing yourself right NOW?" (e.g., yes, no, N/A)   - PLAN: "Do you have a specific plan for how you would do this?" (e.g., gun, knife, overdose, no plan, N/A)  yes has thoughts everyday but does not have a plan at this time 4. RISK OF HARM - HOMICIDAL IDEATION:  "Do you ever have thoughts of hurting or killing someone else?"  (e.g., yes, no, no but preoccupation with thoughts about death)   - INTENT:  "Do you have thoughts of hurting or killing someone right NOW?" (e.g., yes, no, N/A)   - PLAN: "Do you have a specific plan for how you would do this?" (e.g., gun, knife, no plan, N/A)      Just suicidal thoughts no plan 5. ACCESS: If yes to PLAN, "Do you  have access to no?" (e.g., pills stored in bathroom, firearm in house, knife in kitchen)     When I do it, I will just do it, I will not have a plan 6. SUPPORT: "Who is with you now?" "Who do you live with?" "Do you have family or friends nearby who you can talk to?"      Yes has a brother son and nieces that I can talk to  38. THERAPIST: "Do you have a counselor or therapist? Name?"     No 8. STRESSORS: "Has there been any new stress or recent changes in your life?"     No new changes or stressors. I have nothing to look forward to 9. DRUG ABUSE/ALCOHOL: "Do you drink alcohol or use any illegal drugs?"      No 10. OTHER: "Do you have any other health or medical symptoms right now?" (e.g., fever)       No  Protocols used: SUICIDE CONCERNS-A-AH

## 2017-08-06 NOTE — Telephone Encounter (Signed)
See other phone note

## 2017-08-06 NOTE — Telephone Encounter (Signed)
Copied from [place] [id number]. Topic: Appointment Scheduling - Scheduling Inquiry for Clinic >> Aug 06, 2017  4:33 PM Burchel, Abbi R wrote: Reason for CRM:   Pt requesting an appt w/Dr Caryl Bis for med mgmt.   Pt:  [phone]

## 2017-08-06 NOTE — Telephone Encounter (Signed)
Triage note patient has been called by Juliann Pulse and Dr. Caryl Bis has been notified

## 2017-08-06 NOTE — Telephone Encounter (Signed)
Spoke with patient he says that he does have thoughts of hurting himself and that he has lost his medication list and he wants to see Dr. Caryl Bis so he called to say he is having thoughts of hurting himself. Spoke with PCP he say he will give him an appointment. Advised patient will call with appointment but that he needs to be evaluated at ER for suicidal thoughts  Patient refused ,so at advice of PCP contacted [facility] and advised police dept patient had called and threatened suicide. [employer] are en route to patient home.

## 2017-08-07 ENCOUNTER — Telehealth: Payer: Self-pay

## 2017-08-07 NOTE — Telephone Encounter (Signed)
Patient has been scheduled to see Dr. Caryl Bis on 08-08-17 at 4:30.

## 2017-08-07 NOTE — Telephone Encounter (Signed)
Officer Journalist, newspaper at [employer] called for Justin Andrews spoke with Justin Andrews and was unable to take out IVC papers on the patient

## 2017-08-07 NOTE — Telephone Encounter (Signed)
Noted. Please offer the patient an appointment for tomorrow at 4:30. He requested an appointment previously. If he develops any plan or intent to commit suicide he needs to go to the emergency room for evaluation.

## 2017-08-07 NOTE — Telephone Encounter (Signed)
Patient scheduled to  See PCP on 08/08/17.

## 2017-08-08 ENCOUNTER — Ambulatory Visit (INDEPENDENT_AMBULATORY_CARE_PROVIDER_SITE_OTHER): Payer: Medicare HMO | Admitting: Family Medicine

## 2017-08-08 ENCOUNTER — Encounter: Payer: Self-pay | Admitting: Family Medicine

## 2017-08-08 VITALS — BP 160/80 | HR 104 | Temp 98.1°F | Wt 133.4 lb

## 2017-08-08 DIAGNOSIS — F329 Major depressive disorder, single episode, unspecified: Secondary | ICD-10-CM

## 2017-08-08 DIAGNOSIS — R69 Illness, unspecified: Secondary | ICD-10-CM | POA: Diagnosis not present

## 2017-08-08 DIAGNOSIS — Z79899 Other long term (current) drug therapy: Secondary | ICD-10-CM | POA: Diagnosis not present

## 2017-08-08 DIAGNOSIS — F419 Anxiety disorder, unspecified: Secondary | ICD-10-CM

## 2017-08-08 DIAGNOSIS — M150 Primary generalized (osteo)arthritis: Secondary | ICD-10-CM | POA: Diagnosis not present

## 2017-08-08 DIAGNOSIS — M159 Polyosteoarthritis, unspecified: Secondary | ICD-10-CM

## 2017-08-08 DIAGNOSIS — R0609 Other forms of dyspnea: Secondary | ICD-10-CM

## 2017-08-08 DIAGNOSIS — I10 Essential (primary) hypertension: Secondary | ICD-10-CM | POA: Diagnosis not present

## 2017-08-08 DIAGNOSIS — C4322 Malignant melanoma of left ear and external auricular canal: Secondary | ICD-10-CM | POA: Diagnosis not present

## 2017-08-08 DIAGNOSIS — M8949 Other hypertrophic osteoarthropathy, multiple sites: Secondary | ICD-10-CM

## 2017-08-08 DIAGNOSIS — F32A Depression, unspecified: Secondary | ICD-10-CM

## 2017-08-08 DIAGNOSIS — R0600 Dyspnea, unspecified: Secondary | ICD-10-CM

## 2017-08-08 MED ORDER — TRAMADOL HCL 50 MG PO TABS: 100.0000 mg | ORAL_TABLET | Freq: Three times a day (TID) | ORAL | 0 refills | Status: DC

## 2017-08-08 NOTE — Patient Instructions (Addendum)
Nice to see you. We will work on having a home health nurse come out and help you with your medications. Have you come back on Monday to recheck your blood pressure and check labs.

## 2017-08-08 NOTE — Progress Notes (Signed)
[name], MD Phone: [phone]  Justin Andrews is a 83 y.o. male who presents today for f/u.  CC: depression, htn, OA  Depression: Patient notes this is not quite as good as it was previously.  It is hard on him because he cannot get out and do anything.  The Zoloft has been helpful.  He does feel anxious.  He does note intermittent suicidal thoughts though they are brief and he has no plan or intent to harm himself.  He has 2 different bottles of Zoloft.  1 of which says take 1.5 tablets and the other which says take 1 tablet.  Osteoarthritis: Notes scattered joint pains for which tramadol is helpful.  Does note he took extra tramadol recently.  He does not have a bottle of this with him in his medication bag.  Hypertension: He does not check at home.  He does take his amlodipine and carvedilol.  He reports taking his lisinopril though states he does not know where the bottle is.  No chest pain.  He has chronic dyspnea on exertion that has not worsened.  This has been going on for a long time.  No edema.  Patient reports scabs over his right deltoid and left scapula.  He is unsure how he got these.  He never saw dermatology in follow-up for his melanoma.  Social History   Tobacco Use  Smoking Status Current Every Day Smoker  . Packs/day: 1.00  . Years: 70.00  . Pack years: 70.00  . Types: Cigarettes  Smokeless Tobacco Never Used     ROS see history of present illness  Objective  Physical Exam Vitals:   08/08/17 1632 08/08/17 1649  BP: (!) 160/80   Pulse: (!) 110 (!) 104  Temp: 98.1 F (36.7 C)   SpO2: 98%     BP Readings from Last 3 Encounters:  08/08/17 (!) 160/80  07/15/17 136/72  06/18/17 (!) 146/62   Wt Readings from Last 3 Encounters:  08/08/17 133 lb 6.4 oz (60.5 kg)  07/15/17 135 lb 12.8 oz (61.6 kg)  06/18/17 132 lb (59.9 kg)    Physical Exam  Constitutional: No distress.  Cardiovascular: Normal rate, regular rhythm and normal heart sounds.    Pulmonary/Chest: Effort normal and breath sounds normal.  Musculoskeletal: He exhibits no edema.  Neurological: He is alert.  Skin: Skin is warm and dry. He is not diaphoretic.  Scabs on right deltoid and left scapula appear to be well-healing with no signs of infection     Assessment/Plan: Please see individual problem list.  Hypertension It is unclear if the patient is accurately taking his medications.  He seems to be taking amlodipine and carvedilol though may not be taking lisinopril.  We will have him return for lab work on Monday and determine the next step with his medications.  We will also have home health come out to his house to see if they can work with him on medication management.  Malignant melanoma of left ear [facility]) He never followed up with dermatology.  I have asked our referral coordinator to contact dermatology to set up an appointment for the patient.  Needs to see dermatology for the scabs as well.  Anxiety and depression It is unclear if he is taking 100 mg or 150 mg of Zoloft.  We will have home health come to his house to work on medication management.  I will have the CMA contact the patient to see if he would be willing to  see a psychiatrist as I did not get to discuss this with him prior to him leaving.  Dyspnea on exertion Chronic stable issue.  Could be COPD related.  Could be cardiac related.  Previously referred to cardiology though he did not show up for his appointment.  I will have the CMA contact the patient to see if he would be willing to see cardiology.  Osteoarthritis Patient appeared to take an excess of his tramadol.  He is not due for refill based on controlled substance database.  Will provide a short-term refill until home health can come out to work with him on his medications.   Orders Placed This Encounter  Procedures  . Basic Metabolic Panel (BMET)    Standing Status:   Future    Standing Expiration Date:   08/09/2018  . Ambulatory  referral to Home Health    Referral Priority:   Routine    Referral Type:   Home Health Care    Referral Reason:   Specialty Services Required    Requested Specialty:   [place]    Number of Visits Requested:   1    Meds ordered this encounter  Medications  . traMADol (ULTRAM) 50 MG tablet    Sig: Take 2 tablets (100 mg total) by mouth every 8 (eight) hours.    Dispense:  20 tablet    Refill:  0     [name], MD [place]

## 2017-08-12 ENCOUNTER — Telehealth: Payer: Self-pay

## 2017-08-12 DIAGNOSIS — F321 Major depressive disorder, single episode, moderate: Secondary | ICD-10-CM

## 2017-08-12 DIAGNOSIS — R0600 Dyspnea, unspecified: Secondary | ICD-10-CM

## 2017-08-12 NOTE — Assessment & Plan Note (Signed)
It is unclear if he is taking 100 mg or 150 mg of Zoloft.  We will have home health come to his house to work on medication management.  I will have the [place] contact the patient to see if he would be willing to see a psychiatrist as I did not get to discuss this with him prior to him leaving.

## 2017-08-12 NOTE — Telephone Encounter (Signed)
Tried calling, no voicemail. [place] for pec to speak to patient to see if he is ok with a referral to psychiatry for depression and cardiology for shortness of breath

## 2017-08-12 NOTE — Assessment & Plan Note (Signed)
It is unclear if the patient is accurately taking his medications.  He seems to be taking amlodipine and carvedilol though may not be taking lisinopril.  We will have him return for lab work on Monday and determine the next step with his medications.  We will also have home health come out to his house to see if they can work with him on medication management.

## 2017-08-12 NOTE — Assessment & Plan Note (Addendum)
He never followed up with dermatology.  I have asked our referral coordinator to contact dermatology to set up an appointment for the patient.  Needs to see dermatology for the scabs as well.

## 2017-08-12 NOTE — Assessment & Plan Note (Signed)
Chronic stable issue.  Could be COPD related.  Could be cardiac related.  Previously referred to cardiology though he did not show up for his appointment.  I will have the CMA contact the patient to see if he would be willing to see cardiology.

## 2017-08-12 NOTE — Assessment & Plan Note (Signed)
Patient appeared to take an excess of his tramadol.  He is not due for refill based on controlled substance database.  Will provide a short-term refill until home health can come out to work with him on his medications.

## 2017-08-12 NOTE — Telephone Encounter (Signed)
-----   Message from Leone Haven, MD sent at 08/12/2017  1:49 PM EDT ----- Janett Billow, can you please contact the patient to see if he would be willing to see psychiatry for his depression and and see if he would be willing to see cardiology for his shortness of breath?  Melissa, would you be able to contact the patient's dermatologist at [facility] dermatology in [place] to try to get him set up for an appointment.? Thanks.

## 2017-08-12 NOTE — Telephone Encounter (Signed)
Attempted to call pt but no answer at this time.Voicemail mailbox is full and can not accept messages.

## 2017-08-22 ENCOUNTER — Other Ambulatory Visit: Payer: Self-pay | Admitting: Family Medicine

## 2017-08-22 NOTE — Telephone Encounter (Signed)
Please contact the patient and determine if he found his prior bottle of tramadol at home as he did not fill the most recent refill until 11 days after it was prescribed.  I did send in a short-term refill.  Thanks.

## 2017-08-22 NOTE — Telephone Encounter (Signed)
Last OV 08/08/17 last filled 08/08/17 20 0rf

## 2017-08-25 NOTE — Telephone Encounter (Signed)
Called patient back regarding his bottle of tramadol. He said that he has not found the prior bottle of tramadol. He thinks his sister has misplaced his medication (she has alzheimzers) He has picked up his medication that was ordered on the 52 th. He said that he is taking a half tablet every 2 hours and that helps. Doing this to help his pills to last longer.

## 2017-08-25 NOTE — Telephone Encounter (Signed)
Tried calling, no voicemail, ok for pec to speak to patient about refill message below

## 2017-08-26 ENCOUNTER — Other Ambulatory Visit: Payer: Self-pay | Admitting: Family Medicine

## 2017-08-26 NOTE — Telephone Encounter (Signed)
Patient would like referral to both

## 2017-08-26 NOTE — Telephone Encounter (Signed)
Last OV 08/08/17 last filled 08/22/17 20 0rf

## 2017-08-26 NOTE — Telephone Encounter (Signed)
Referral placed. I will send to [facility] to get this set up and to have her contact the patients dermatologist to get him set up for an appointment.

## 2017-08-26 NOTE — Addendum Note (Signed)
Addended by: Leone Haven on: 08/26/2017 01:42 PM   Modules accepted: Orders

## 2017-08-26 NOTE — Telephone Encounter (Signed)
Controlled substance database reviewed.  Refill sent to pharmacy.

## 2017-09-15 ENCOUNTER — Telehealth: Payer: Self-pay | Admitting: Family Medicine

## 2017-09-15 DIAGNOSIS — G479 Sleep disorder, unspecified: Secondary | ICD-10-CM

## 2017-09-15 NOTE — Telephone Encounter (Signed)
Copied from [place]. Topic: Quick Communication - Rx Refill/Question >> Sep 15, 2017  4:52 PM Blase Mess A wrote: Medication: traZODone (DESYREL) 50 MG tablet [[id number]]   Has the patient contacted their pharmacy? Yes  (Agent: If no, request that the patient contact the pharmacy for the refill.) (Agent: If yes, when and what did the pharmacy advise?)  Preferred Pharmacy (with phone number or street name):[id number] [address], [place] [address] Phone: [phone] Fax: [phone]    Agent: Please be advised that RX refills may take up to 3 business days. We ask that you follow-up with your pharmacy.

## 2017-09-15 NOTE — Telephone Encounter (Signed)
rx request patient last seen on 08-08-17 and this was last filled on 03-21-17.

## 2017-09-16 ENCOUNTER — Telehealth: Payer: Self-pay

## 2017-09-16 NOTE — Telephone Encounter (Signed)
Copied from [place] [id number]. Topic: Appointment Scheduling - Scheduling Inquiry for Clinic >> Sep 16, 2017  2:33 PM Justin Andrews wrote: Reason for CRM: pt called to get in w/ pcp so that he can have a f/u and speak about his kidneys Called patient to inform him that Dr. Caryl Bis is not in the office this week but we could possibly scheduled him appt to speak with Dr. Caryl Bis about his kidney's, was unable to leave voicemail.

## 2017-09-17 MED ORDER — TRAZODONE HCL 50 MG PO TABS: 25.0000 mg | ORAL_TABLET | Freq: Every evening | ORAL | 0 refills | Status: DC | PRN

## 2017-09-17 NOTE — Telephone Encounter (Signed)
Tried calling patient unable to leave message due to voicemail box full

## 2017-09-17 NOTE — Telephone Encounter (Signed)
Kristen,   Call pt I Refilled trazodone for one month  See also note from patina 9/10 as she tried to reach patient regarding f/u appt with pcp   Melissa,   Has he been scheduled to see cardiology and dermatology?

## 2017-09-18 ENCOUNTER — Telehealth: Payer: Self-pay | Admitting: Family Medicine

## 2017-09-18 NOTE — Telephone Encounter (Signed)
Copied from [place] [id number]. Topic: Quick Communication - Rx Refill/Question >> Sep 18, 2017  3:22 PM Margot Ables wrote: Medication: tramadol - 7 left - takes 6/day - pt requesting refill from another provider if Dr. Caryl Bis is out of the office.  Has the patient contacted their pharmacy? No - told to call MD Preferred Pharmacy (with phone number or street name): [id number] [address], [place] [phone] (Phone) [phone] (Fax)

## 2017-09-19 ENCOUNTER — Other Ambulatory Visit: Payer: Self-pay | Admitting: Family Medicine

## 2017-09-19 ENCOUNTER — Telehealth: Payer: Self-pay | Admitting: *Deleted

## 2017-09-19 NOTE — Telephone Encounter (Signed)
Thank you for making me aware Justin Andrews  Please call patient and advise if he has any suicidal ideation or plan to harm anyone else, he needs to call 911   I cannot refill his tramadol early if he has not been taking the medication as prescribed  Juliann Pulse, please keep Caryl Bis on this conversation so he can may decide going forward how to handle this with this patient

## 2017-09-19 NOTE — Telephone Encounter (Signed)
I reviewed Justin Andrews's note.  Patient agreed to go to ED if he has pain.

## 2017-09-19 NOTE — Telephone Encounter (Signed)
SEE other phone note from today.

## 2017-09-19 NOTE — Telephone Encounter (Signed)
Patient called and stated he is out of his tramadol and that when he runs out of this medication he can become suicidal, advised patient this medication was just filled on 08/26/17 180 tablets and sig reads take 2 tablets  every 8 hours  As need  For pain. Patient admitted to nurse that he has taken more than is prescribed and now completely out of medication . Advised patient that PCP is not in office and will not return until next week. Patient acknowledged understanding.

## 2017-09-19 NOTE — Telephone Encounter (Signed)
Patient notified and stated he will go to ED if he has pain.

## 2017-09-23 ENCOUNTER — Telehealth: Payer: Self-pay | Admitting: *Deleted

## 2017-09-23 NOTE — Telephone Encounter (Signed)
Patient said he will come tomorrow at 4:30

## 2017-09-23 NOTE — Telephone Encounter (Signed)
Copied from [place] [id number]. Topic: Appointment Scheduling - Prior Auth Required for Appointment >> Sep 23, 2017 12:35 PM Ahmed Prima L wrote: Patient said he would like to come and see Dr Caryl Bis for a follow up on his blood pressure and kidneys. Patient would like to be worked in.

## 2017-09-23 NOTE — Telephone Encounter (Signed)
He has been scheduled with cardiology in November. He has not been rescheduled at dermatology yet. He continues cancelling/no showing his appointments with them. I will reschedule him again if they will let me.

## 2017-09-23 NOTE — Telephone Encounter (Signed)
I can see him 09/24/17 at 4:30 pm.

## 2017-09-23 NOTE — Telephone Encounter (Signed)
Can this be a sameday or do we need a 30 minute slot.

## 2017-09-23 NOTE — Telephone Encounter (Signed)
Last OV 08/08/2017   Last refilled 08/26/2017 disp 180 with no refills   Sent to PCP to advise

## 2017-09-23 NOTE — Telephone Encounter (Signed)
Patient needs to be seen in the office to discuss refills of this medication. He should not be out at this point. I will forward to [facility] to call the patient. Please offer him an appointment as outlined in my phone note. Thanks.

## 2017-09-24 ENCOUNTER — Encounter: Payer: Self-pay | Admitting: Family Medicine

## 2017-09-24 ENCOUNTER — Telehealth: Payer: Self-pay

## 2017-09-24 ENCOUNTER — Ambulatory Visit (INDEPENDENT_AMBULATORY_CARE_PROVIDER_SITE_OTHER): Payer: Medicare HMO | Admitting: Family Medicine

## 2017-09-24 VITALS — BP 160/82 | HR 100 | Temp 98.0°F | Ht 67.0 in | Wt 131.8 lb

## 2017-09-24 DIAGNOSIS — M8949 Other hypertrophic osteoarthropathy, multiple sites: Secondary | ICD-10-CM

## 2017-09-24 DIAGNOSIS — R801 Persistent proteinuria, unspecified: Secondary | ICD-10-CM

## 2017-09-24 DIAGNOSIS — F32A Depression, unspecified: Secondary | ICD-10-CM

## 2017-09-24 DIAGNOSIS — R05 Cough: Secondary | ICD-10-CM | POA: Diagnosis not present

## 2017-09-24 DIAGNOSIS — H539 Unspecified visual disturbance: Secondary | ICD-10-CM | POA: Diagnosis not present

## 2017-09-24 DIAGNOSIS — F419 Anxiety disorder, unspecified: Secondary | ICD-10-CM

## 2017-09-24 DIAGNOSIS — C4322 Malignant melanoma of left ear and external auricular canal: Secondary | ICD-10-CM | POA: Diagnosis not present

## 2017-09-24 DIAGNOSIS — R059 Cough, unspecified: Secondary | ICD-10-CM

## 2017-09-24 DIAGNOSIS — I10 Essential (primary) hypertension: Secondary | ICD-10-CM | POA: Diagnosis not present

## 2017-09-24 DIAGNOSIS — F329 Major depressive disorder, single episode, unspecified: Secondary | ICD-10-CM

## 2017-09-24 DIAGNOSIS — M159 Polyosteoarthritis, unspecified: Secondary | ICD-10-CM

## 2017-09-24 DIAGNOSIS — M150 Primary generalized (osteo)arthritis: Secondary | ICD-10-CM

## 2017-09-24 DIAGNOSIS — R69 Illness, unspecified: Secondary | ICD-10-CM | POA: Diagnosis not present

## 2017-09-24 MED ORDER — AMOXICILLIN-POT CLAVULANATE 500-125 MG PO TABS: 1.0000 | ORAL_TABLET | Freq: Two times a day (BID) | ORAL | 0 refills | Status: DC

## 2017-09-24 MED ORDER — AMLODIPINE BESYLATE 10 MG PO TABS: 10.0000 mg | ORAL_TABLET | Freq: Every day | ORAL | 3 refills | Status: DC

## 2017-09-24 MED ORDER — TRAMADOL HCL 50 MG PO TABS: ORAL_TABLET | ORAL | 0 refills | Status: DC

## 2017-09-24 NOTE — Patient Instructions (Signed)
Nice to see you. I am going to refer you to a psychiatrist.  If you develop an intent or plan to harm yourself you need to call 911 and go to the emergency room. We will treat you with an antibiotic for ear infection and possible sinus infection. We will get a chest x-ray and call you with the results. We are going to try to get you set up with dermatology and nephrology.

## 2017-09-24 NOTE — Progress Notes (Signed)
[name], MD Phone: [phone]  Justin Andrews is a 83 y.o. male who presents today for f/u.  CC: htn, depression, cough, melanoma, proteinuria  HYPERTENSION  Disease Monitoring  Home BP Monitoring not checking Chest pain- no    Dyspnea- chronic and stable related to COPD Medications  Compliance-  Taking amlodipine 5 mg and coreg.  Edema- no  Depression: Patient notes this is not good currently.  Patient reports it is related to his pain.  When his pain is under control his depression is better.  He does note intermittent suicidal ideation with no plan or intent to harm himself.  He notes when he has his pain medication he does not have any thoughts of harming himself.  He notes he does not currently have any suicidal ideation.  He does note the Zoloft has been helpful.  He freely admits he does not have any guns at home though he does note he has knives.  He notes he has not thought about using them.  Cough: Patient notes for the last 3 weeks or so he has had sinus congestion as well as upper chest congestion with cough productive of white mucus and blowing white mucus out of his nose.  He notes no changes in his breathing.  No wheezing.  No fevers.  He does note some sneezing.  He does note some left ear pain.  He does note his eyes have been watering quite a bit.  His vision is a little foggy since he has been sick.  He notes no eye pain.  Patient has not seen dermatology for his melanoma.  We have made multiple appointments for the patient and he has not been able to go.  Patient was also supposed to be referred to nephrology previously though we are never able to get in touch with him regarding this referral.  Patient additionally has chronic arthritic pain for which he takes tramadol.  He freely noted previously that he was taking more than prescribed and ran out early.  He is due for refill tomorrow.  Social History   Tobacco Use  Smoking Status Current Every Day Smoker  .  Packs/day: 1.00  . Years: 70.00  . Pack years: 70.00  . Types: Cigarettes  Smokeless Tobacco Never Used     ROS see history of present illness  Objective  Physical Exam Vitals:   09/24/17 1608  BP: (!) 160/82  Pulse: 100  Temp: 98 F (36.7 C)  SpO2: 98%    BP Readings from Last 3 Encounters:  09/24/17 (!) 160/82  08/08/17 (!) 160/80  07/15/17 136/72   Wt Readings from Last 3 Encounters:  09/24/17 131 lb 12.8 oz (59.8 kg)  08/08/17 133 lb 6.4 oz (60.5 kg)  07/15/17 135 lb 12.8 oz (61.6 kg)    Physical Exam  Constitutional: No distress.  HENT:  Head: Normocephalic and atraumatic.  Mouth/Throat: Oropharynx is clear and moist. No oropharyngeal exudate.  Left TM with erythema and bulging, right TM normal  Eyes: Pupils are equal, round, and reactive to light. Conjunctivae and EOM are normal.  Visual fields intact  Neck: Neck supple.  Cardiovascular: Normal rate, regular rhythm and normal heart sounds.  Pulmonary/Chest: Effort normal and breath sounds normal.  Musculoskeletal: He exhibits no edema.  Lymphadenopathy:    He has no cervical adenopathy.  Neurological: He is alert.  Skin: Skin is warm and dry. He is not diaphoretic.     Assessment/Plan: Please see individual problem list.  Hypertension  Uncontrolled.  I have advised him that we are increasing his amlodipine.  He will return in 1 month for BP check with nursing.  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[place]) I have advised him that he needs to see dermatology.  I have spoken with our referral coordinator to try to get him another appointment.  The patient has been neglecting his medical care.  We will involve DSS given this neglect.  Our clinical nurse will contact them regarding this.  Anxiety and depression The patient continues to have issues with depression.  It seems to be mostly related to his pain.  He does note intermittent suicidal ideation.  He has no intent or plan.  I have advised him that if he  develops any intent or plan he needs to call 911 and go to the emergency room.  He verbalizes understanding and states he will do this if he develops intent or plan to harm himself.  We are going to refer him to psychiatry for further evaluation.  I have spoken with our referral coordinator to try to get this referral done fairly quickly.  Cough Patient appears to have a left ear infection as well as likely sinusitis.  We will cover him with renally dosed Augmentin.  If not improving he will return to care.  Changes in vision Patient with several weeks of foggy vision.  Visual fields are intact.  No pain in his eyes.  No erythema of the conjunctiva.  This may be related to his upper respiratory infection and watering in his eyes.  We will refer to his ophthalmologist for evaluation.  Osteoarthritis Patient with chronic arthritic pain.  He takes tramadol for this.  I advised him that he cannot take more than prescribed.  I advised that if he takes more than prescribed we will not be able to refill it early.  He verbalized understanding.  Refill given.  Proteinuria Patient with chronic kidney disease and proteinuria.  I discussed referral to nephrology and this was placed.   Orders Placed This Encounter  Procedures  . DG Chest 2 View    Standing Status:   Future    Standing Expiration Date:   11/25/2018    Order Specific Question:   Reason for Exam (SYMPTOM  OR DIAGNOSIS REQUIRED)    Answer:   cough, chest congestion, chronic dyspnea, history of COPD    Order Specific Question:   Preferred imaging location?    Answer:   [facility] Specific Question:   Radiology Contrast Protocol - do NOT remove file path    Answer:   \\[email or web address].pdf  . Ambulatory referral to Psychiatry    Referral Priority:   Routine    Referral Type:   Psychiatric    Referral Reason:   Specialty Services Required    Requested Specialty:   Psychiatry     Number of Visits Requested:   1  . Ambulatory referral to Ophthalmology    Referral Priority:   Routine    Referral Type:   Consultation    Referral Reason:   Specialty Services Required    Requested Specialty:   Ophthalmology    Number of Visits Requested:   1  . Ambulatory referral to Nephrology    Referral Priority:   Routine    Referral Type:   Consultation    Referral Reason:   Specialty Services Required    Requested Specialty:   Nephrology    Number of Visits Requested:  1    Meds ordered this encounter  Medications  . amoxicillin-clavulanate (AUGMENTIN) 500-125 MG tablet    Sig: Take 1 tablet (500 mg total) by mouth every 12 (twelve) hours.    Dispense:  14 tablet    Refill:  0  . amLODipine (NORVASC) 10 MG tablet    Sig: Take 1 tablet (10 mg total) by mouth daily.    Dispense:  90 tablet    Refill:  3  . traMADol (ULTRAM) 50 MG tablet    Sig: TAKE 2 TABLETS BY MOUTH EVERY 8 HOURS    Dispense:  180 tablet    Refill:  0    Not to exceed 5 additional fills before 02/18/2018.     [name], MD [place]

## 2017-09-24 NOTE — Telephone Encounter (Signed)
Pt scheduled  

## 2017-09-24 NOTE — Telephone Encounter (Signed)
Copied from [place] [id number]. Topic: Appointment Scheduling - Prior Auth Required for Appointment >> Sep 23, 2017 12:35 PM Ahmed Prima L wrote: Patient said he would like to come and see Dr Caryl Bis for a follow up on his blood pressure and kidneys. Patient would like to be worked in. Patient has been scheduled to see Dr. Caryl Bis

## 2017-09-25 DIAGNOSIS — H539 Unspecified visual disturbance: Secondary | ICD-10-CM | POA: Insufficient documentation

## 2017-09-25 DIAGNOSIS — R05 Cough: Secondary | ICD-10-CM | POA: Insufficient documentation

## 2017-09-25 DIAGNOSIS — R059 Cough, unspecified: Secondary | ICD-10-CM | POA: Insufficient documentation

## 2017-09-25 DIAGNOSIS — R809 Proteinuria, unspecified: Secondary | ICD-10-CM | POA: Insufficient documentation

## 2017-09-25 NOTE — Assessment & Plan Note (Signed)
Patient with chronic kidney disease and proteinuria.  I discussed referral to nephrology and this was placed.

## 2017-09-25 NOTE — Progress Notes (Signed)
Called adult protective services left voicemail they are suppose to return call.

## 2017-09-25 NOTE — Assessment & Plan Note (Signed)
Patient appears to have a left ear infection as well as likely sinusitis.  We will cover him with renally dosed Augmentin.  If not improving he will return to care.

## 2017-09-25 NOTE — Assessment & Plan Note (Signed)
Uncontrolled.  I have advised him that we are increasing his amlodipine.  He will return in 1 month for BP check with nursing.

## 2017-09-25 NOTE — Assessment & Plan Note (Addendum)
The patient continues to have issues with depression.  It seems to be mostly related to his pain.  He does note intermittent suicidal ideation.  He has no intent or plan.  I have advised him that if he develops any intent or plan he needs to call 911 and go to the emergency room.  He verbalizes understanding and states he will do this if he develops intent or plan to harm himself.  We are going to refer him to psychiatry for further evaluation.  I have spoken with our referral coordinator to try to get this referral done fairly quickly.

## 2017-09-25 NOTE — Assessment & Plan Note (Signed)
I have advised him that he needs to see dermatology.  I have spoken with our referral coordinator to try to get him another appointment.  The patient has been neglecting his medical care.  We will involve DSS given this neglect.  Our clinical nurse will contact them regarding this.

## 2017-09-25 NOTE — Assessment & Plan Note (Signed)
Patient with chronic arthritic pain.  He takes tramadol for this.  I advised him that he cannot take more than prescribed.  I advised that if he takes more than prescribed we will not be able to refill it early.  He verbalized understanding.  Refill given.

## 2017-09-25 NOTE — Progress Notes (Signed)
He has been scheduled and notified about his dermatology appt for 9/30 at 315. His nephrology, ophthalmology and psychiatry referrals have been sent.

## 2017-09-25 NOTE — Assessment & Plan Note (Signed)
Patient with several weeks of foggy vision.  Visual fields are intact.  No pain in his eyes.  No erythema of the conjunctiva.  This may be related to his upper respiratory infection and watering in his eyes.  We will refer to his ophthalmologist for evaluation.

## 2017-09-26 NOTE — Telephone Encounter (Signed)
One of yours.   I placed derm referral while you were out.

## 2017-09-26 NOTE — Telephone Encounter (Signed)
Noted. i've discussed the derm referral with Melissa.

## 2017-10-02 ENCOUNTER — Telehealth: Payer: Self-pay | Admitting: Family Medicine

## 2017-10-02 NOTE — Telephone Encounter (Signed)
Called and spoke with pt. Pt was last seen on 09/24/2917 seen for anxiety and depression and a cough. Pt stated that he is still coughing pretty badly and still dealing with the chest congestion.   Sent to PCP for approval to send in antibiotic   Thanks    Amoxicillin last refilled on 09/24/2017 disp 14 with no refills

## 2017-10-02 NOTE — Telephone Encounter (Signed)
rx request 

## 2017-10-02 NOTE — Telephone Encounter (Signed)
Called and spoke with pt. Pt advised and voiced understanding. Scheduled for an appt with Philis Nettle

## 2017-10-02 NOTE — Telephone Encounter (Signed)
Copied from [place] [id number]. Topic: Quick Communication - See Telephone Encounter >> Oct 02, 2017  9:08 AM Conception Chancy, NT wrote: CRM for notification. See Telephone encounter for: 10/02/17.  Patient is calling and states he needs a refill on amoxicillin-clavulanate (AUGMENTIN) 500-125 MG tablet as he is still congested.  [id number] [address], [place] [address] Phone: [phone] Fax: [phone]

## 2017-10-02 NOTE — Progress Notes (Signed)
Left message for adult protective service to return call to office.

## 2017-10-02 NOTE — Telephone Encounter (Signed)
He will need to be re-evaluated to determine appropriate therapy and see if he needs a CXR. Please offer him an available appointment with me or with Lauren.

## 2017-10-03 ENCOUNTER — Ambulatory Visit: Payer: Medicare HMO | Admitting: Family Medicine

## 2017-10-03 ENCOUNTER — Encounter: Payer: Self-pay | Admitting: Family Medicine

## 2017-10-03 ENCOUNTER — Ambulatory Visit (INDEPENDENT_AMBULATORY_CARE_PROVIDER_SITE_OTHER): Payer: Medicare HMO | Admitting: Family Medicine

## 2017-10-03 VITALS — BP 134/60 | HR 76 | Temp 98.4°F | Ht 67.0 in | Wt 132.4 lb

## 2017-10-03 DIAGNOSIS — R69 Illness, unspecified: Secondary | ICD-10-CM | POA: Diagnosis not present

## 2017-10-03 DIAGNOSIS — R05 Cough: Secondary | ICD-10-CM | POA: Diagnosis not present

## 2017-10-03 DIAGNOSIS — J019 Acute sinusitis, unspecified: Secondary | ICD-10-CM

## 2017-10-03 DIAGNOSIS — R059 Cough, unspecified: Secondary | ICD-10-CM

## 2017-10-03 DIAGNOSIS — F17210 Nicotine dependence, cigarettes, uncomplicated: Secondary | ICD-10-CM | POA: Diagnosis not present

## 2017-10-03 MED ORDER — CEFTRIAXONE SODIUM 1 G IJ SOLR
1.0000 g | Freq: Once | INTRAMUSCULAR | Status: AC
  Administered 2017-10-03: 1 g via INTRAMUSCULAR

## 2017-10-03 NOTE — Progress Notes (Signed)
Subjective:    Patient ID: Justin Andrews, male    DOB: [birth year], 83 y.o.   MRN: [id number]  HPI   Patient presents to clinic complaining of continued cough and chest congestion.  Patient was seen on September 25, 2016 by Dr. Caryl Bis and was treated for sinusitis with Augmentin twice daily for 7 days.  Patient states his cough and congestion has somewhat improved, but are not 100% better.  Patient is concerned because the cough has not gone away completely.  Patient is a smoker, smokes 1 pack a day and has no plans to quit smoking.  Denies any fever or chills.  Denies wheezing.  Notes some white phlegm with cough.  Patient Active Problem List   Diagnosis Date Noted  . Cough 09/25/2017  . Changes in vision 09/25/2017  . Proteinuria 09/25/2017  . Bronchitis 05/05/2017  . Basal cell carcinoma 02/28/2017  . 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 Dyspnea on exertion 01/17/2017  . Chronic right upper quadrant pain 11/15/2016  . Balance problem 11/15/2016  . Simple renal cyst 10/21/2016  . Prostate nodule 10/16/2016  . Dysuria 10/16/2016  . CKD (chronic kidney disease) stage 3, GFR 30-59 ml/min ([facility]) 08/05/2016  . Anemia 08/05/2016  . Anxiety and depression 08/05/2016  . Weight loss 08/05/2016  . Sleep disturbance 07/23/2016  . Hypertension   . Osteoarthritis   . Tobacco abuse   . 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[place]) 03/06/2016   Social History   Tobacco Use  . Smoking status: Current Every Day Smoker    Packs/day: 1.00    Years: 70.00    Pack years: 70.00    Types: Cigarettes  . Smokeless tobacco: Never Used  Substance Use Topics  . Alcohol use: No   Review of Systems  Constitutional: Negative for chills, fatigue and fever.  HENT: +nasal congestion. Negative for ear pain, sinus pain and sore throat.   Eyes: Negative.   Respiratory: + cough. Negative shortness of breath and wheezing.   Cardiovascular: Negative for chest pain, palpitations and leg swelling.    Gastrointestinal: Negative for abdominal pain, diarrhea, nausea and vomiting.  Genitourinary: Negative for dysuria, frequency and urgency.  Musculoskeletal: Negative for arthralgias and myalgias.  Skin: Negative for color change, pallor and rash.  Neurological: Negative for syncope, light-headedness and headaches.  Psychiatric/Behavioral: The patient is not nervous/anxious.       Objective:   Physical Exam  Constitutional: No distress.  Head: Normocephalic and atraumatic.  Eyes: P Conjunctivae and EOM are normal. No scleral icterus.  Nose/throat: +post nasal drip, +rhinorrhea Neck: Normal range of motion. Neck supple. No tracheal deviation present.  Cardiovascular: Normal rate, regular rhythm and normal heart sounds.  Pulmonary/Chest: Mild upper resp congestion, but improved after a few coughs.  Effort normal and breath sounds normal. No respiratory distress. He has no wheezes. He has no rales.  Neurological: He is alert and oriented to person, place, and time.  Gait normal  Skin: Skin is warm and dry. He is not diaphoretic. No pallor.  Psychiatric: He has a normal mood and affect.   Nursing note and vitals reviewed.   Vitals:   10/03/17 1450  BP: 134/60  Pulse: 76  Temp: 98.4 F (36.9 C)  SpO2: 95%       Assessment & Plan:   Sinusitis -- patient completed Augmentin course.  Due to continued complaints of congestion and cough symptoms we will give patient 1 g of IM Rocephin in clinic. Administrations This  Visit    cefTRIAXone (ROCEPHIN) injection 1 g    Admin Date 10/03/2017 Action Given Dose 1 g Route Intramuscular Administered By Neta Ehlers, RMA         Cough- we will get chest x-ray to rule out any pneumonia.  I suspect cough is related to a combination of postnasal drip and patient's long-term smoking history.  Advised he can use over-the-counter cough syrup such as a Robitussin to help calm cough.  Cigarette smoker- encourage patient to think about  cutting back and even attempting to quit smoking, but patient states he is 83 years old and has no plans to quit at this time.  Keep regularly scheduled follow-up as planned.  Return to clinic sooner if symptoms persist or worsen.

## 2017-10-06 ENCOUNTER — Encounter: Payer: Self-pay | Admitting: Family Medicine

## 2017-10-06 DIAGNOSIS — F17210 Nicotine dependence, cigarettes, uncomplicated: Secondary | ICD-10-CM | POA: Insufficient documentation

## 2017-10-07 NOTE — Progress Notes (Signed)
Contacted DSS through the Emergency Line today due to no answer on normal DSS line. Spoke with social worker Chrissie Noa he stated that investigation would takeplace and that office will be notified of outcome.

## 2017-10-07 NOTE — Progress Notes (Signed)
Called DSS left message Urgent request to return call to office.

## 2017-10-15 ENCOUNTER — Telehealth: Payer: Self-pay | Admitting: *Deleted

## 2017-10-15 NOTE — Telephone Encounter (Signed)
Copied from [place] [id number]. Topic: Appointment Scheduling - Scheduling Inquiry for Clinic >> Oct 15, 2017  3:32 PM Rutherford Nail, [place] wrote: Reason for CRM: Patient calling to see if Dr Caryl Bis could fit him in on the schedule. Offered first available and patient denies and only wants Dr Caryl Bis. Patient c/o urinary frequency and to discuss his blood pressure. States it is high according to his last visit. Looking in last OV notes it was 134/60. Denies check blood pressure at home. States that "he can just tell when it is high." Please advise.

## 2017-10-16 NOTE — Telephone Encounter (Signed)
Sent to PCP to advise 

## 2017-10-16 NOTE — Telephone Encounter (Signed)
Called and spoke with pt to schedule for a Monday appt next week and nothing I available on 10/14. Pt can NOT wait to be seen on Friday which is when your next appt availability is at 3:15.   Pt wants to be fit in on Monday if possible. Pt is c/o kidney issues and high BP.

## 2017-10-16 NOTE — Telephone Encounter (Signed)
It appears I have availability on Monday at this time.  You can offer him a Monday appointment or you can offer him an appointment with Lauren tomorrow if he is unable to wait until Monday to be evaluated.

## 2017-10-17 ENCOUNTER — Other Ambulatory Visit: Payer: Self-pay | Admitting: Family Medicine

## 2017-10-17 NOTE — Telephone Encounter (Signed)
Controlled substance database reviewed.  Sent to pharmacy with a fill date of 10/24/2017 as that is when his refill is due.

## 2017-10-17 NOTE — Telephone Encounter (Signed)
He can be placed in my 1130 slot on Monday.

## 2017-10-17 NOTE — Telephone Encounter (Signed)
Scheduled

## 2017-10-20 ENCOUNTER — Encounter: Payer: Self-pay | Admitting: Family Medicine

## 2017-10-20 ENCOUNTER — Ambulatory Visit (INDEPENDENT_AMBULATORY_CARE_PROVIDER_SITE_OTHER): Payer: Medicare HMO | Admitting: Family Medicine

## 2017-10-20 VITALS — BP 160/84 | HR 81 | Temp 98.0°F | Ht 67.0 in | Wt 135.8 lb

## 2017-10-20 DIAGNOSIS — M8949 Other hypertrophic osteoarthropathy, multiple sites: Secondary | ICD-10-CM

## 2017-10-20 DIAGNOSIS — R350 Frequency of micturition: Secondary | ICD-10-CM | POA: Diagnosis not present

## 2017-10-20 DIAGNOSIS — I10 Essential (primary) hypertension: Secondary | ICD-10-CM

## 2017-10-20 DIAGNOSIS — H539 Unspecified visual disturbance: Secondary | ICD-10-CM

## 2017-10-20 DIAGNOSIS — M150 Primary generalized (osteo)arthritis: Secondary | ICD-10-CM

## 2017-10-20 DIAGNOSIS — C4322 Malignant melanoma of left ear and external auricular canal: Secondary | ICD-10-CM

## 2017-10-20 DIAGNOSIS — M159 Polyosteoarthritis, unspecified: Secondary | ICD-10-CM

## 2017-10-20 LAB — POCT URINALYSIS DIPSTICK
Bilirubin, UA: NEGATIVE
GLUCOSE UA: NEGATIVE
KETONES UA: NEGATIVE
LEUKOCYTES UA: NEGATIVE
Nitrite, UA: NEGATIVE
Protein, UA: POSITIVE — AB
RBC UA: NEGATIVE
Urobilinogen, UA: 0.2 E.U./dL
pH, UA: 6 (ref 5.0–8.0)

## 2017-10-20 MED ORDER — TRAMADOL HCL 50 MG PO TABS: ORAL_TABLET | ORAL | 0 refills | Status: DC

## 2017-10-20 NOTE — Assessment & Plan Note (Signed)
Reinforced the need for him to see dermatology.  We will check with our referral coordinator regarding her appointment.

## 2017-10-20 NOTE — Progress Notes (Signed)
[name], MD Phone: [phone]  Justin Andrews is a 83 y.o. male who presents today for f/u.  CC: htn, urine frequency, chronic pain  HYPERTENSION  Disease Monitoring  Home BP Monitoring not checking Chest pain- no    Dyspnea- chronic and stable Medications  Compliance-  It is unclear if the patient is taking all of the BP medications that are listed. It appears his lisinopril has not been refilled in some time.   Lightheadedness-  Yes, when he gets up in the morning  Urine frequency: Patient notes this has been going on for several weeks.  He does note urgency.  No dysuria.  No hematuria.  No fevers.  He does not strain.  Some mild starting and stopping.  He does feel empty.  Chronic pain: Patient continues to take tramadol.  Notes he is taking it as prescribed.  He brings his bottle in which states his prescription was filled on 09/21/2017.  He is due for refill.  Controlled substance database reviewed stating this was filled on 09/24/2017.  Melanoma: Patient never saw dermatology.  He notes he had the appointment written down though missed it.  Patient reports chronic vision issues.  There has been no change in his vision over the last year.  He saw an optometrist though he is seeking a second opinion from an ophthalmologist. He has an appointment scheduled with them. He notes the vision in his right eye seems foggy and is been so for a year.  Social History   Tobacco Use  Smoking Status Current Every Day Smoker  . Packs/day: 1.00  . Years: 70.00  . Pack years: 70.00  . Types: Cigarettes  Smokeless Tobacco Never Used     ROS see history of present illness  Objective  Physical Exam Vitals:   10/20/17 1534  BP: (!) 160/84  Pulse: 81  Temp: 98 F (36.7 C)  SpO2: 96%    BP Readings from Last 3 Encounters:  10/20/17 (!) 160/84  10/03/17 134/60  09/24/17 (!) 160/82   Wt Readings from Last 3 Encounters:  10/20/17 135 lb 12.8 oz (61.6 kg)  10/03/17 132 lb 6.4  oz (60.1 kg)  09/24/17 131 lb 12.8 oz (59.8 kg)    Physical Exam  Constitutional: No distress.  Eyes: Pupils are equal, round, and reactive to light. Conjunctivae are normal.  All visual fields intact  Cardiovascular: Normal rate, regular rhythm and normal heart sounds.  Pulmonary/Chest: Effort normal and breath sounds normal.  Musculoskeletal: He exhibits no edema.  Neurological: He is alert.  Skin: Skin is warm and dry. He is not diaphoretic.     Assessment/Plan: Please see individual problem list.  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  We will check with our referral coordinator regarding her appointment.  Hypertension Elevated today though it is unclear whether or not he needs taking all of the medications listed on his medication list.  He is unsure if he is taking the lisinopril.  He had home health come out to his house recently and we will see if we can get in touch with them to confirm the medications he is taking.  Changes in vision Notes this has been going on for some time now.  He will keep this appointment with ophthalmology.  Frequency of urination Could be BPH related.  We will rule out infection and consider treatment with Flomax if there is no infection.  Osteoarthritis Based on review of the  patient's medication bottle it appears he is due for refill.  This will be provided to him.   Orders Placed This Encounter  Procedures  . Urine Culture  . Urine Microscopic Only  . POC Urinalysis Dipstick    Meds ordered this encounter  Medications  . traMADol (ULTRAM) 50 MG tablet    Sig: TAKE 2 TABLETS BY MOUTH EVERY 8 HOURS    Dispense:  180 tablet    Refill:  0    Please cancel the prescription written 10/17/17     [name], MD [place]

## 2017-10-20 NOTE — Patient Instructions (Signed)
Nice to see you. We are going to check with home health to see what medicines you are taking. We will contact you when your urine results return.

## 2017-10-21 DIAGNOSIS — R350 Frequency of micturition: Secondary | ICD-10-CM | POA: Insufficient documentation

## 2017-10-21 LAB — URINALYSIS, MICROSCOPIC ONLY
RBC / HPF: NONE SEEN (ref 0–?)
WBC, UA: NONE SEEN (ref 0–?)

## 2017-10-21 LAB — URINE CULTURE
MICRO NUMBER:: 91232432
Result:: NO GROWTH
SPECIMEN QUALITY:: ADEQUATE

## 2017-10-21 NOTE — Assessment & Plan Note (Signed)
Could be BPH related.  We will rule out infection and consider treatment with Flomax if there is no infection.

## 2017-10-21 NOTE — Assessment & Plan Note (Signed)
Elevated today though it is unclear whether or not he needs taking all of the medications listed on his medication list.  He is unsure if he is taking the lisinopril.  He had home health come out to his house recently and we will see if we can get in touch with them to confirm the medications he is taking.

## 2017-10-21 NOTE — Assessment & Plan Note (Signed)
Based on review of the patient's medication bottle it appears he is due for refill.  This will be provided to him.

## 2017-10-21 NOTE — Assessment & Plan Note (Signed)
Notes this has been going on for some time now.  He will keep this appointment with ophthalmology.

## 2017-10-25 ENCOUNTER — Other Ambulatory Visit: Payer: Self-pay | Admitting: Family Medicine

## 2017-10-25 MED ORDER — TAMSULOSIN HCL 0.4 MG PO CAPS: 0.4000 mg | ORAL_CAPSULE | Freq: Every day | ORAL | 3 refills | Status: DC

## 2017-11-07 DIAGNOSIS — A0472 Enterocolitis due to Clostridium difficile, not specified as recurrent: Secondary | ICD-10-CM

## 2017-11-07 HISTORY — DX: Enterocolitis due to Clostridium difficile, not specified as recurrent: A04.72

## 2017-11-10 ENCOUNTER — Ambulatory Visit: Payer: Medicare HMO | Admitting: Urology

## 2017-11-11 ENCOUNTER — Telehealth: Payer: Self-pay

## 2017-11-11 ENCOUNTER — Encounter: Payer: Self-pay | Admitting: Urology

## 2017-11-11 NOTE — Telephone Encounter (Signed)
Copied from [place] [id number]. Topic: General - Other >> Nov 11, 2017 12:47 PM Oneta Rack wrote: Relation to pt: self  Call back number: [phone]   Reason for call:  Patient states he cant control bowels for the last month, patient states he would like to see PCP only, PCP next available appt is not until 12/18, please advise

## 2017-11-11 NOTE — Telephone Encounter (Signed)
Sent to PCP for approval to see if we can see patient sooner. Thanks

## 2017-11-12 ENCOUNTER — Telehealth: Payer: Self-pay

## 2017-11-12 NOTE — Telephone Encounter (Signed)
Called and spoke with patient. Pt is aware that he has an appt for this Friday at 3:15. Could you please schedule pt since I am unable to do so?  Thanks  Sent to [employer]

## 2017-11-12 NOTE — Telephone Encounter (Signed)
Called patient and unable to leave a VM. Sent to [place] could you schedule this patient for a same day appt on Friday at 3:15 ?   Thanks you

## 2017-11-12 NOTE — Telephone Encounter (Signed)
Copied from [place] [phone]. Topic: General - Other >> Nov 12, 2017  1:01 PM Yvette Rack wrote: Reason for CRM: pt calling stating that he only want to see DR Caryl Bis he thinks he has pneumonia I advised him that I could get him in to see Lauren he refused I also advised him that Caryl Bis is out of the office tomorrow pt got upset and hung up

## 2017-11-12 NOTE — Telephone Encounter (Signed)
Copied from [place] [id number]. Topic: General - Other >> Nov 12, 2017  1:01 PM Yvette Rack wrote: Reason for CRM: pt calling stating that he only want to see DR Caryl Bis he thinks he has pneumonia I advised him that I could get him in to see Lauren he refused I also advised him that Caryl Bis is out of the office tomorrow pt got upset and hung up >> Nov 12, 2017  1:42 PM Vernona Rieger wrote: Patient called back and said that he wants to come in today at 3 and see someone. Advised patient there are no open appointments. He stated to me that he had stomach pain for 2 days. I clarified with him was it stomach pain or signs of pneumonia and he said stomach pain. I advised him I could scheduled him with Lauren for tomorrow and he said " no, just forget it, and hung up.

## 2017-11-12 NOTE — Telephone Encounter (Signed)
Patient called and said he received a call from the office and would like a call back

## 2017-11-12 NOTE — Telephone Encounter (Signed)
Called patient and was unable to leave a VM due to mailbox being full will try to call again later.

## 2017-11-12 NOTE — Telephone Encounter (Signed)
He can be placed in the 3:15 time slot on Friday if it is still available.

## 2017-11-12 NOTE — Telephone Encounter (Signed)
Pt is aware that he is scheduled for an appt this Friday at 3:15 PM.   Please schedule this patient since I am unable to schedule same day appts.   Thanks you   11/14/2017 @ 3:15 Same day for uncontrolled bowels

## 2017-11-13 NOTE — Telephone Encounter (Signed)
Ok. Pt is scheduled. Thank you! °

## 2017-11-13 NOTE — Telephone Encounter (Signed)
Scheduled

## 2017-11-14 ENCOUNTER — Telehealth: Payer: Self-pay | Admitting: Family Medicine

## 2017-11-14 ENCOUNTER — Encounter: Payer: Self-pay | Admitting: Family Medicine

## 2017-11-14 ENCOUNTER — Ambulatory Visit (INDEPENDENT_AMBULATORY_CARE_PROVIDER_SITE_OTHER): Payer: Medicare HMO | Admitting: Family Medicine

## 2017-11-14 VITALS — BP 158/90 | HR 110 | Temp 97.5°F | Ht 67.0 in | Wt 138.8 lb

## 2017-11-14 DIAGNOSIS — R197 Diarrhea, unspecified: Secondary | ICD-10-CM

## 2017-11-14 DIAGNOSIS — M150 Primary generalized (osteo)arthritis: Secondary | ICD-10-CM

## 2017-11-14 DIAGNOSIS — K319 Disease of stomach and duodenum, unspecified: Secondary | ICD-10-CM | POA: Diagnosis not present

## 2017-11-14 DIAGNOSIS — R0609 Other forms of dyspnea: Secondary | ICD-10-CM | POA: Diagnosis not present

## 2017-11-14 DIAGNOSIS — C4322 Malignant melanoma of left ear and external auricular canal: Secondary | ICD-10-CM

## 2017-11-14 DIAGNOSIS — M8949 Other hypertrophic osteoarthropathy, multiple sites: Secondary | ICD-10-CM

## 2017-11-14 DIAGNOSIS — M159 Polyosteoarthritis, unspecified: Secondary | ICD-10-CM

## 2017-11-14 DIAGNOSIS — R0600 Dyspnea, unspecified: Secondary | ICD-10-CM

## 2017-11-14 NOTE — Telephone Encounter (Signed)
Spoke with [facility] if an urgent referral is put in the system with the for the urgent issue's [facility] will help with transportation to missed appointments.

## 2017-11-14 NOTE — Patient Instructions (Signed)
Nice to see you. Please complete the stool studies. We will call you with your lab results. We will get you to see cardiology.

## 2017-11-15 DIAGNOSIS — R197 Diarrhea, unspecified: Secondary | ICD-10-CM | POA: Insufficient documentation

## 2017-11-15 DIAGNOSIS — K319 Disease of stomach and duodenum, unspecified: Secondary | ICD-10-CM | POA: Insufficient documentation

## 2017-11-15 LAB — CBC
HCT: 33.3 % — ABNORMAL LOW (ref 38.5–50.0)
Hemoglobin: 11.4 g/dL — ABNORMAL LOW (ref 13.2–17.1)
MCH: 31.8 pg (ref 27.0–33.0)
MCHC: 34.2 g/dL (ref 32.0–36.0)
MCV: 93 fL (ref 80.0–100.0)
MPV: 11 fL (ref 7.5–12.5)
Platelets: 307 10*3/uL (ref 140–400)
RBC: 3.58 10*6/uL — AB (ref 4.20–5.80)
RDW: 15 % (ref 11.0–15.0)
WBC: 12.8 10*3/uL — ABNORMAL HIGH (ref 3.8–10.8)

## 2017-11-15 LAB — COMPREHENSIVE METABOLIC PANEL
AG RATIO: 1.5 (calc) (ref 1.0–2.5)
ALKALINE PHOSPHATASE (APISO): 86 U/L (ref 40–115)
ALT: 12 U/L (ref 9–46)
AST: 18 U/L (ref 10–35)
Albumin: 4.1 g/dL (ref 3.6–5.1)
BILIRUBIN TOTAL: 0.3 mg/dL (ref 0.2–1.2)
BUN/Creatinine Ratio: 18 (calc) (ref 6–22)
BUN: 36 mg/dL — ABNORMAL HIGH (ref 7–25)
CALCIUM: 8.8 mg/dL (ref 8.6–10.3)
CHLORIDE: 107 mmol/L (ref 98–110)
CO2: 20 mmol/L (ref 20–32)
Creat: 2.01 mg/dL — ABNORMAL HIGH (ref 0.70–1.11)
GLOBULIN: 2.8 g/dL (ref 1.9–3.7)
Glucose, Bld: 97 mg/dL (ref 65–99)
Potassium: 4.5 mmol/L (ref 3.5–5.3)
Sodium: 138 mmol/L (ref 135–146)
Total Protein: 6.9 g/dL (ref 6.1–8.1)

## 2017-11-15 LAB — TSH: TSH: 3.1 mIU/L (ref 0.40–4.50)

## 2017-11-15 NOTE — Assessment & Plan Note (Signed)
EKG is reassuring.  He will keep his scheduled appointment with cardiology.

## 2017-11-15 NOTE — Telephone Encounter (Signed)
Referral placed.

## 2017-11-15 NOTE — Progress Notes (Signed)
[name], MD Phone: [phone]  Justin Andrews is a 83 y.o. male who presents today for follow-up.  CC: Diarrhea, arthritis, dyspnea on exertion  Diarrhea: Patient notes for the last 3 weeks he has had loose liquidy stools that are brown and black mixed together.  He notes one episode of abdominal pain with this lasting 5 minutes in the left side of his abdomen though this has not recurred.  He notes no blood in his stool.  No nausea or vomiting.  No contacts with similar symptoms.  He notes having diarrhea up to 14 times daily.  He notes no dietary changes, medication changes, or fevers.  He is drinking plenty of fluids.  Arthritic pain: Tramadol does help ease the pain.  He does note he feels it is not as effective as it could be.  He hurts in all his joints.  The tramadol does not make him drowsy.  Dyspnea on exertion: This is an ongoing issue.  He has been referred to cardiology.  He notes no chest pain.  He notes the dyspnea resolves with rest.  He reports a stress test that was negative 12 years ago.  He does smoke though he has cut down to a third of a pack per day.  Patient has still not seen dermatology.  He has not been able to arrange for transportation.  We did contact DSS after a prior appointment and I discussed this with our clinical nurse who advised that DSS responded and evaluated the patient's home and found no issues for them to be involved with.  Social History   Tobacco Use  Smoking Status Current Every Day Smoker  . Packs/day: 1.00  . Years: 70.00  . Pack years: 70.00  . Types: Cigarettes  Smokeless Tobacco Never Used     ROS see history of present illness  Objective  Physical Exam Vitals:   11/14/17 1517  BP: (!) 158/90  Pulse: (!) 110  Temp: (!) 97.5 F (36.4 C)  SpO2: 97%    BP Readings from Last 3 Encounters:  11/14/17 (!) 158/90  10/20/17 (!) 160/84  10/03/17 134/60   Wt Readings from Last 3 Encounters:  11/14/17 138 lb 12.8 oz (63  kg)  10/20/17 135 lb 12.8 oz (61.6 kg)  10/03/17 132 lb 6.4 oz (60.1 kg)    Physical Exam  Constitutional: No distress.  Cardiovascular: Regular rhythm and normal heart sounds. Tachycardia present.  Pulmonary/Chest: Effort normal and breath sounds normal.  Abdominal: Soft. Bowel sounds are normal. He exhibits no distension. There is no tenderness. There is no rebound and no guarding.  Musculoskeletal: He exhibits no edema.  Neurological: He is alert.  Skin: Skin is warm and dry. He is not diaphoretic.   EKG: Sinus tachycardia, rate 103, no ST or T wave changes noted from prior EKG  Assessment/Plan: Please see individual problem list.  Diarrhea Patient with several weeks of diarrhea with no obvious cause.  He has a benign abdominal exam.  He is slightly tachycardic which could indicate some mild dehydration.  We will check renal function, CBC, and stool studies.  He will stay adequately hydrated.  He is given return precautions.  Osteoarthritis Chronic issue.  He will continue with his current dose of tramadol.  Could consider referral to pain management though transportation is an issue.  DOE (dyspnea on exertion) EKG is reassuring.  He will keep his scheduled appointment with cardiology.  Malignant melanoma of left ear [facility]) Our clinical nurse is going  to check with [facility] to see if they are an option to provide transportation for the patient.  I reinforced the need for the patient to see dermatology.  Duodenum disorder Previously referred to GI though the patient declined to see them.  Discussed with him today and he would be willing to see them.  Referral placed.   Orders Placed This Encounter  Procedures  . Clostridium Difficile by PCR([facility])    Standing Status:   Future    Standing Expiration Date:   11/15/2018    Order Specific Question:   Is your patient experiencing loose or watery stools (3 or more in 24 hours)?    Answer:   Yes    Order Specific Question:    Has the patient received laxatives in the last 24 hours?    Answer:   No    Order Specific Question:   Has a negative Cdiff test resulted in the last 7 days?    Answer:   No  . Comp Met (CMET)  . CBC  . TSH  . GI pathogen panel by PCR, stool    Standing Status:   Future    Standing Expiration Date:   11/15/2018  . Ambulatory referral to Gastroenterology    Referral Priority:   Routine    Referral Type:   Consultation    Referral Reason:   Specialty Services Required    Number of Visits Requested:   1  . EKG 12-Lead    No orders of the defined types were placed in this encounter.    [name], MD [place]

## 2017-11-15 NOTE — Assessment & Plan Note (Signed)
Patient with several weeks of diarrhea with no obvious cause.  He has a benign abdominal exam.  He is slightly tachycardic which could indicate some mild dehydration.  We will check renal function, CBC, and stool studies.  He will stay adequately hydrated.  He is given return precautions.

## 2017-11-15 NOTE — Assessment & Plan Note (Signed)
Previously referred to GI though the patient declined to see them.  Discussed with him today and he would be willing to see them.  Referral placed.

## 2017-11-15 NOTE — Assessment & Plan Note (Addendum)
Chronic issue.  He will continue with his current dose of tramadol.  Could consider referral to pain management though transportation is an issue.

## 2017-11-15 NOTE — Assessment & Plan Note (Signed)
Our clinical nurse is going to check with [facility] to see if they are an option to provide transportation for the patient.  I reinforced the need for the patient to see dermatology.

## 2017-11-17 ENCOUNTER — Other Ambulatory Visit: Payer: Self-pay

## 2017-11-17 NOTE — Telephone Encounter (Signed)
Patient aware.

## 2017-11-17 NOTE — Patient Outreach (Signed)
[facility]) Care Management  11/17/2017  Justin Andrews 03-18-1934 [id number]   Telephone Screen  Referral Date: 11/17/17 Referral Source: MD office Referral Reason: "transportation issues that are presenting patient from being evaluated for melanoma" Insurance: [employer]   Outreach attempt #1 to patient. No answer at present. RN CM unable to leave voicemail message as mailbox full.      Plan: RN CM will make outreach attempt to patient within 3-4 business days. RN CM will send unsuccessful outreach letter to patient.    Enzo Montgomery, RN,BSN,CCM [place] [facility] Telephonic Care Management Coordinator Direct Phone: [phone] Toll Free: [phone] Fax: [phone]

## 2017-11-18 ENCOUNTER — Ambulatory Visit: Payer: Medicare HMO | Admitting: Family Medicine

## 2017-11-18 ENCOUNTER — Ambulatory Visit: Payer: Medicare HMO | Admitting: Cardiovascular Disease

## 2017-11-19 ENCOUNTER — Other Ambulatory Visit: Payer: Self-pay | Admitting: Family Medicine

## 2017-11-19 ENCOUNTER — Other Ambulatory Visit: Payer: Self-pay

## 2017-11-19 ENCOUNTER — Emergency Department: Payer: Medicare HMO

## 2017-11-19 ENCOUNTER — Emergency Department
Admission: EM | Admit: 2017-11-19 | Discharge: 2017-11-19 | Disposition: A | Discharge status: Home / Self Care | Payer: Medicare HMO | Attending: Emergency Medicine | Admitting: Emergency Medicine

## 2017-11-19 ENCOUNTER — Encounter: Payer: Self-pay | Admitting: Cardiovascular Disease

## 2017-11-19 DIAGNOSIS — R69 Illness, unspecified: Secondary | ICD-10-CM | POA: Diagnosis not present

## 2017-11-19 DIAGNOSIS — Z79899 Other long term (current) drug therapy: Secondary | ICD-10-CM | POA: Insufficient documentation

## 2017-11-19 DIAGNOSIS — F17210 Nicotine dependence, cigarettes, uncomplicated: Secondary | ICD-10-CM | POA: Diagnosis not present

## 2017-11-19 DIAGNOSIS — Z85828 Personal history of other malignant neoplasm of skin: Secondary | ICD-10-CM | POA: Insufficient documentation

## 2017-11-19 DIAGNOSIS — J40 Bronchitis, not specified as acute or chronic: Secondary | ICD-10-CM

## 2017-11-19 DIAGNOSIS — I129 Hypertensive chronic kidney disease with stage 1 through stage 4 chronic kidney disease, or unspecified chronic kidney disease: Secondary | ICD-10-CM | POA: Diagnosis not present

## 2017-11-19 DIAGNOSIS — N183 Chronic kidney disease, stage 3 (moderate): Secondary | ICD-10-CM | POA: Insufficient documentation

## 2017-11-19 DIAGNOSIS — R0602 Shortness of breath: Secondary | ICD-10-CM | POA: Insufficient documentation

## 2017-11-19 DIAGNOSIS — R0789 Other chest pain: Secondary | ICD-10-CM

## 2017-11-19 DIAGNOSIS — R05 Cough: Secondary | ICD-10-CM | POA: Diagnosis not present

## 2017-11-19 LAB — TROPONIN I
TROPONIN I: 0.03 ng/mL — AB (ref <0.03)
Troponin I: 0.03 ng/mL (ref <0.03)

## 2017-11-19 LAB — CBC WITH DIFFERENTIAL/PLATELET
ABS IMMATURE GRANULOCYTES: 0.05 10*3/uL (ref 0.00–0.07)
BASOS ABS: 0.1 10*3/uL (ref 0.0–0.1)
Basophils Relative: 1 %
Eosinophils Absolute: 0.4 10*3/uL (ref 0.0–0.5)
Eosinophils Relative: 4 %
HEMATOCRIT: 31.2 % — AB (ref 39.0–52.0)
HEMOGLOBIN: 9.8 g/dL — AB (ref 13.0–17.0)
Immature Granulocytes: 1 %
LYMPHS ABS: 1.4 10*3/uL (ref 0.7–4.0)
LYMPHS PCT: 14 %
MCH: 32 pg (ref 26.0–34.0)
MCHC: 31.4 g/dL (ref 30.0–36.0)
MCV: 102 fL — ABNORMAL HIGH (ref 80.0–100.0)
Monocytes Absolute: 1.2 10*3/uL — ABNORMAL HIGH (ref 0.1–1.0)
Monocytes Relative: 12 %
NEUTROS ABS: 6.5 10*3/uL (ref 1.7–7.7)
NEUTROS PCT: 68 %
NRBC: 0 % (ref 0.0–0.2)
Platelets: 251 10*3/uL (ref 150–400)
RBC: 3.06 MIL/uL — ABNORMAL LOW (ref 4.22–5.81)
RDW: 15.6 % — ABNORMAL HIGH (ref 11.5–15.5)
WBC: 9.5 10*3/uL (ref 4.0–10.5)

## 2017-11-19 LAB — BASIC METABOLIC PANEL
ANION GAP: 9 (ref 5–15)
BUN: 24 mg/dL — AB (ref 8–23)
CALCIUM: 9 mg/dL (ref 8.9–10.3)
CO2: 22 mmol/L (ref 22–32)
Chloride: 107 mmol/L (ref 98–111)
Creatinine, Ser: 1.89 mg/dL — ABNORMAL HIGH (ref 0.61–1.24)
GFR calc Af Amer: 37 mL/min — ABNORMAL LOW (ref >60)
GFR calc non Af Amer: 32 mL/min — ABNORMAL LOW (ref >60)
GLUCOSE: 97 mg/dL (ref 70–99)
Potassium: 4.1 mmol/L (ref 3.5–5.1)
SODIUM: 138 mmol/L (ref 135–145)

## 2017-11-19 LAB — HEPATIC FUNCTION PANEL
ALBUMIN: 3.7 g/dL (ref 3.5–5.0)
ALK PHOS: 72 U/L (ref 38–126)
ALT: 12 U/L (ref 0–44)
AST: 14 U/L — AB (ref 15–41)
BILIRUBIN TOTAL: 0.7 mg/dL (ref 0.3–1.2)
Bilirubin, Direct: <0.1 mg/dL (ref 0.0–0.2)
TOTAL PROTEIN: 7.3 g/dL (ref 6.5–8.1)

## 2017-11-19 LAB — LIPASE, BLOOD: Lipase: 31 U/L (ref 11–51)

## 2017-11-19 LAB — CG4 I-STAT (LACTIC ACID): LACTIC ACID, VENOUS: 0.41 mmol/L — AB (ref 0.5–1.9)

## 2017-11-19 LAB — BRAIN NATRIURETIC PEPTIDE: B NATRIURETIC PEPTIDE 5: 457 pg/mL — AB (ref 0.0–100.0)

## 2017-11-19 MED ORDER — ALBUTEROL SULFATE (2.5 MG/3ML) 0.083% IN NEBU
2.5000 mg | INHALATION_SOLUTION | Freq: Once | RESPIRATORY_TRACT | Status: AC
  Administered 2017-11-19: 2.5 mg via RESPIRATORY_TRACT
  Filled 2017-11-19: qty 3

## 2017-11-19 MED ORDER — SODIUM CHLORIDE 0.9 % IV BOLUS
500.0000 mL | Freq: Once | INTRAVENOUS | Status: AC
  Administered 2017-11-19: 500 mL via INTRAVENOUS

## 2017-11-19 MED ORDER — MORPHINE SULFATE (PF) 4 MG/ML IV SOLN
4.0000 mg | Freq: Once | INTRAVENOUS | Status: AC
  Administered 2017-11-19: 4 mg via INTRAVENOUS
  Filled 2017-11-19: qty 1

## 2017-11-19 NOTE — Discharge Instructions (Addendum)
Up with Dr. Caryl Bis within the next week.  Return to the ER for new, worsening, persistent severe chest pain, shortness of breath, weakness, or any other new or worsening symptoms that concern you.

## 2017-11-19 NOTE — Patient Outreach (Signed)
[facility]) Care Management  11/19/2017  Justin Andrews January 11, 1934 [id number]    Telephone Screen  Referral Date: 11/17/17 Referral Source: MD office Referral Reason: "transportation issues that are presenting patient from being evaluated for melanoma" Insurance: [employer]    Outreach attempt #2 to patient. Spoke with patient. Patient with noticeable SOB with talking. He voices that he is not feeling well today and was "getting ready to go get checked out." Advised patient that RN CM would call back at another time.        Plan: RN CM will make outreach attempt to patient within 3-4 business days.   Enzo Montgomery, RN,BSN,CCM [place] [facility] Telephonic Care Management Coordinator Direct Phone: [phone] Toll Free: [phone] Fax: [phone]

## 2017-11-19 NOTE — ED Notes (Signed)
Pt states that he has to leave due to his ride situation. Requested patient to sit in stretcher and will inform EDP.

## 2017-11-19 NOTE — ED Notes (Signed)
ED Provider at bedside. 

## 2017-11-19 NOTE — ED Notes (Signed)
Back into patient room to remove PIV and assist with dressing for discharge. Pt took out his PIV prior to, bleeding stopped at this time. Pt took cardiac leads off himself. Pt states that he has to go and that he has been here for a very long time. Requested that patient await printed discharge papers before he leaves. Pt acknowledges. Pt dressing now.

## 2017-11-19 NOTE — ED Triage Notes (Signed)
Pt to ED via ems. Pt c/o congestion and  right chest pain with shortness of breath  x2days that present on palpation. Pt has productive cough, coughing up white phlegm x 2 days. Pt states "this is what I felt like the last time I had pneumonia."

## 2017-11-19 NOTE — ED Notes (Addendum)
Pt not in room when entering with discharge papers.  No opportunity to collect discharge vital signs.

## 2017-11-19 NOTE — ED Provider Notes (Signed)
[facility] Emergency Department Provider Note ____________________________________________   First MD Initiated Contact with Patient 11/19/17 [phone]     (approximate)  I have reviewed the triage vital signs and the nursing notes.   HISTORY  Chief Complaint Chest Pain    HPI Justin Andrews is a 83 y.o. male with PMH as noted below including chronic kidney disease who presents with chest pain, right-sided, feeling like it is under his ribs, worse with deep breath, and associated with productive cough with white sputum and shortness of breath.  He states it feels similar to prior pneumonia.  The patient denies any vomiting, fever, or abdominal pain.  No leg swelling or pain.   Past Medical History:  Diagnosis Date  . Elevated serum creatinine   . History of melanoma   . Hypertension   . Osteoarthritis   . Tobacco abuse     Patient Active Problem List   Diagnosis Date Noted  . Diarrhea 11/15/2017  . Duodenum disorder 11/15/2017  . Frequency of urination 10/21/2017  . Cigarette smoker 10/06/2017  . Cough 09/25/2017  . Changes in vision 09/25/2017  . Proteinuria 09/25/2017  . Basal cell carcinoma 02/28/2017  . 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 DOE (dyspnea on exertion) 01/17/2017  . Chronic right upper quadrant pain 11/15/2016  . Balance problem 11/15/2016  . Simple renal cyst 10/21/2016  . Prostate nodule 10/16/2016  . Dysuria 10/16/2016  . CKD (chronic kidney disease) stage 3, GFR 30-59 ml/min (HCC) 08/05/2016  . Anemia 08/05/2016  . Anxiety and depression 08/05/2016  . Weight loss 08/05/2016  . Sleep disturbance 07/23/2016  . Hypertension   . Osteoarthritis   . Tobacco abuse   . 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 ANKLE FRACTURE SURGERY    . EXTERNAL EAR SURGERY    . ROTATOR CUFF REPAIR    . TONSILLECTOMY    . TYMPANOPLASTY W/ MASTOIDECTOMY     Patient  with reports of mastoid surgery (appears to have had surgery on TM; Left).    Prior to Admission medications   Medication Sig Start Date End Date Taking? Authorizing Provider  amLODipine (NORVASC) 10 MG tablet Take 1 tablet (10 mg total) by mouth daily. 09/24/17  Yes Leone Haven, MD  carvedilol (COREG) 6.25 MG tablet TAKE 1 TABLET (6.25 MG TOTAL) BY MOUTH 2 (TWO) TIMES DAILY WITH A MEAL. 07/17/17  Yes Leone Haven, MD  [employer] VITAMIN B12 1000 MCG tablet TAKE 1 TABLET BY MOUTH EVERY DAY Patient taking differently: Take 1,000 mcg by mouth daily.  11/19/17  Yes Leone Haven, MD  tamsulosin (FLOMAX) 0.4 MG CAPS capsule Take 1 capsule (0.4 mg total) by mouth daily. 10/25/17  Yes Leone Haven, MD  traMADol (ULTRAM) 50 MG tablet TAKE 2 TABLETS BY MOUTH EVERY 8 HOURS Patient taking differently: Take 100 mg by mouth every 8 (eight) hours. TAKE 2 TABLETS BY MOUTH EVERY 8 HOURS 10/24/17  Yes Leone Haven, MD  traZODone (DESYREL) 50 MG tablet Take 0.5 tablets (25 mg total) by mouth at bedtime as needed for sleep. 09/17/17  Yes Burnard Hawthorne, FNP  ferrous sulfate 325 (65 FE) MG tablet Take 1 tablet (325 mg total) by mouth 2 (two) times daily. 10/17/16   Leone Haven, MD  folic acid (FOLVITE) 1 MG tablet TAKE 1 TABLET BY MOUTH EVERY DAY 02/05/17   Leone Haven,  MD  lisinopril (PRINIVIL,ZESTRIL) 20 MG tablet Take 20 mg by mouth daily. 07/11/16   [provider]  ranitidine (ZANTAC) 150 MG tablet Take 150 mg by mouth 2 (two) times daily.    [provider]  sertraline (ZOLOFT) 100 MG tablet TAKE 1.5 TABLETS (150 MG TOTAL) BY MOUTH DAILY. Patient not taking: Reported on 11/19/2017 06/23/17   Leone Haven, MD    Allergies Aspirin  Family History  Problem Relation Age of Onset  . Prostate cancer Neg Hx   . Bladder Cancer Neg Hx   . Kidney cancer Neg Hx     Social History Social History   Tobacco Use  . Smoking status: Current Every Day  Smoker    Packs/day: 1.00    Years: 70.00    Pack years: 70.00    Types: Cigarettes  . Smokeless tobacco: Never Used  Substance Use Topics  . Alcohol use: No  . Drug use: No    Review of Systems  Constitutional: No fever. Eyes: No redness. ENT: No sore throat. Cardiovascular: Positive for chest pain. Respiratory: Positive for shortness of breath. Gastrointestinal: No vomiting or diarrhea.  Genitourinary: Negative for dysuria.  Musculoskeletal: Negative for back pain. Skin: Negative for rash. Neurological: Negative for headache.   ____________________________________________   PHYSICAL EXAM:  VITAL SIGNS: ED Triage Vitals  Enc Vitals Group     BP 11/19/17 0934 (!) 164/84     Pulse Rate 11/19/17 0934 98     Resp 11/19/17 0934 18     Temp 11/19/17 0934 98.5 F (36.9 C)     Temp Source 11/19/17 0934 Oral     SpO2 11/19/17 0928 97 %     Weight 11/19/17 0929 138 lb 12.8 oz (63 kg)     Height 11/19/17 0929 5\' 7"  (1.702 m)     Head Circumference --      Peak Flow --      Pain Score 11/19/17 0928 10     Pain Loc --      Pain Edu? --      Excl. in [place]? --     Constitutional: Alert and oriented.  Slightly frail and weak appearing but in no acute distress. Eyes: Conjunctivae are normal.  Head: Atraumatic. Nose: No congestion/rhinnorhea. Mouth/Throat: Mucous membranes are moist.   Neck: Normal range of motion.  Cardiovascular: Normal rate, regular rhythm. Grossly normal heart sounds.  Good peripheral circulation. Respiratory: Normal respiratory effort.  No retractions.  Coarse breath sounds with wheezing bilaterally. Gastrointestinal: Soft and nontender. No distention.  Genitourinary: No flank tenderness. Musculoskeletal: No lower extremity edema.  No calf or popliteal swelling or tenderness.  Extremities warm and well perfused.  Neurologic:  Normal speech and language. No gross focal neurologic deficits are appreciated.  Skin:  Skin is warm and dry. No rash  noted. Psychiatric: Mood and affect are normal. Speech and behavior are normal.  ____________________________________________   LABS (all labs ordered are listed, but only abnormal results are displayed)  Labs Reviewed  BASIC METABOLIC PANEL - Abnormal; Notable for the following components:      Result Value   BUN 24 (*)    Creatinine, Ser 1.89 (*)    GFR calc non Af Amer 32 (*)    GFR calc Af Amer 37 (*)    All other components within normal limits  CBC WITH DIFFERENTIAL/PLATELET - Abnormal; Notable for the following components:   RBC 3.06 (*)    Hemoglobin 9.8 (*)    HCT  31.2 (*)    MCV 102.0 (*)    RDW 15.6 (*)    Monocytes Absolute 1.2 (*)    All other components within normal limits  HEPATIC FUNCTION PANEL - Abnormal; Notable for the following components:   AST 14 (*)    All other components within normal limits  BRAIN NATRIURETIC PEPTIDE - Abnormal; Notable for the following components:   B Natriuretic Peptide 457.0 (*)    All other components within normal limits  TROPONIN I - Abnormal; Notable for the following components:   Troponin I 0.03 (*)    All other components within normal limits  TROPONIN I - Abnormal; Notable for the following components:   Troponin I 0.03 (*)    All other components within normal limits  CG4 I-STAT (LACTIC ACID) - Abnormal; Notable for the following components:   Lactic Acid, Venous 0.41 (*)    All other components within normal limits  LIPASE, BLOOD   ____________________________________________  EKG  ED ECG REPORT I, Arta Silence, the attending physician, personally viewed and interpreted this ECG.  Date: 11/19/2017 EKG Time: 0929 Rate: 103 Rhythm: Sinus tachycardia QRS Axis: normal Intervals: normal ST/T Wave abnormalities: normal Narrative Interpretation: LVH with no evidence of acute ischemia  ____________________________________________  RADIOLOGY  CXR: No focal infiltrate or other acute  abnormality  ____________________________________________   PROCEDURES  Procedure(s) performed: No  Procedures  Critical Care performed: No ____________________________________________   INITIAL IMPRESSION / ASSESSMENT AND PLAN / ED COURSE  Pertinent labs & imaging results that were available during my care of the patient were reviewed by me and considered in my medical decision making (see chart for details).  83 year old male with PMH as noted above presents with right-sided atypical chest pain, productive cough, shortness of breath over the last 2 days.  On exam, the patient is relatively comfortable appearing and in no acute respiratory distress.  His vital signs are normal except for hypertension.  However after conversing with me his O2 saturation went down to the high 80s.  He has wheezing bilaterally and the remainder of the exam is as described above.  I reviewed the past medical records in epic.  The patient has had no recent ED visits or admissions.    Overall the presentation is most consistent with pneumonia or bronchitis.  Differential also includes less likely CHF or ACS.  I do not suspect PE given the lack of significant hypoxia or tachycardia or any DVT symptoms or specific risk factors.  We will obtain chest x-ray, lab work-up, give trial of bronchodilator and reassess.  ----------------------------------------- 1:52 PM on 11/19/2017 -----------------------------------------  Chest x-ray showed no acute infiltrate.  His lactate is not elevated, and his chemistry and CBC are within his normal range except for slightly lower hemoglobin than prior.  His initial troponin was 0.03, which is not significantly elevated given his renal disease.  Repeat troponin after 3 hours was unchanged.  There is no evidence of ischemia.  The patient felt significantly better after the pain medication.  When I went to reassess him, he had gotten up, gotten dressed, and taken out his own  IV.  The patient stated that he was depending on a family member to take him home and they were about to arrive.  He said he felt much better and wanted to go home.  I counseled him on the results of the work-up.  Overall I suspect that the patient has a mild bronchitis/URI with cough, causing chest wall pain.  I gave him thorough return precautions and he expressed understanding and agreement.  He is stable for discharge at this time. ____________________________________________   FINAL CLINICAL IMPRESSION(S) / ED DIAGNOSES  Final diagnoses:  Chest wall pain  Bronchitis      NEW MEDICATIONS STARTED DURING THIS VISIT:  New Prescriptions   No medications on file     Note:  This document was prepared using Dragon voice recognition software and may include unintentional dictation errors.    Arta Silence, MD 11/19/17 1355

## 2017-11-21 ENCOUNTER — Other Ambulatory Visit: Payer: Self-pay

## 2017-11-21 ENCOUNTER — Inpatient Hospital Stay: Payer: Medicare HMO | Admitting: Family Medicine

## 2017-11-21 ENCOUNTER — Inpatient Hospital Stay
Admission: RE | Admit: 2017-11-21 | Discharge: 2017-11-21 | Disposition: A | Discharge status: Home / Self Care | Payer: Self-pay | Source: Ambulatory Visit

## 2017-11-21 DIAGNOSIS — R197 Diarrhea, unspecified: Secondary | ICD-10-CM

## 2017-11-21 LAB — GASTROINTESTINAL PANEL BY PCR, STOOL (REPLACES STOOL CULTURE)

## 2017-11-21 LAB — C DIFFICILE QUICK SCREEN W PCR REFLEX
C DIFFICLE (CDIFF) ANTIGEN: POSITIVE — AB
C Diff interpretation: DETECTED
C Diff toxin: POSITIVE — AB

## 2017-11-21 NOTE — ED Notes (Addendum)
Emergency contact Olegario Shearer (niece), called at [phone]. Message left for her to ask her uncle to please call the ED charge for pt's pharmacy verification.

## 2017-11-21 NOTE — ED Notes (Addendum)
Niece Olegario Shearer called back to ED and spoke to this RN. Niece verified pt's pharmacy. MD Kinner aware and calling in prescription at this time.

## 2017-11-21 NOTE — ED Provider Notes (Signed)
+  c. Diff, called in PO vanc to his pharmacy, has outpatient follow up scheduled   Lavonia Drafts, MD 11/21/17 1955

## 2017-11-21 NOTE — Patient Outreach (Signed)
[facility]) Care Management  11/21/2017  DEMARIAN EPPS 02/16/1934 [id number]   Telephone Screen  Referral Date:11/17/17 Referral Source:MD office Referral Reason:"transportation issues that are presenting patient from being evaluated for melanoma" Insurance:[employer] Medicare   Outreach attempt #3 to patient. No answer at present and voicemail full.     Plan: RN CM will close case if no response from letter mailed to patient.   Enzo Montgomery, RN,BSN,CCM [place] [facility] Telephonic Care Management Coordinator Direct Phone: [phone] Toll Free: [phone] Fax: [phone]

## 2017-11-21 NOTE — ED Notes (Addendum)
Phone number on file called for pt. Message unable to be left by this RN due to mailbox being full.

## 2017-11-22 NOTE — ED Provider Notes (Signed)
-----------------------------------------   12:14 PM on 11/22/2017 -----------------------------------------  I received a call from the pharmacy that the patient was being prescribed vancomycin for C. difficile but could not afford the prescription as it was more than $200.  I ordered the antibiotic to be changed to metronidazole.   Arta Silence, MD 11/22/17 1215

## 2017-11-24 ENCOUNTER — Other Ambulatory Visit: Payer: Self-pay

## 2017-11-24 NOTE — Patient Outreach (Signed)
[facility]) Care Management  11/24/2017  Justin Andrews 09-07-1934 [id number]   Telephone Screen  Referral Date:11/17/17 Referral Source:MD office Referral Reason:"transportation issues that are presenting patient from being evaluated for melanoma" Insurance:[employer] Medicare   Message received from [facility] that MD office requesting additional outreach to try to engage patient. RN CM has already made three phone attempt to reach patient and sent letter to patient with no response. Fourth outreach attempt to patient. Spoke with patient who was disengaged and not wanting to complete screening assessment questions. He voiced that he only needed transportation assistance and did not want to answer other questions. Per patient, his niece or sister would normally take him to appts but are not always available. Patient voices that he is "renting a room" from his niece. He states that he is independent with ADLs/IADLs except he no longer drives. He denies any falls and denies any DME usage. RN CM attempted to address medication mgmt but patient voiced he "only takes seven meds and don't need no help with that." Per chart review, patient has PMH of OA, duodenum disorder and malignant melanoma of left ear. RN CM attempted to discuss medical conditions but patient declined and voiced he is "not worried about it and it will take care of itself." RN CM discussed with patient [facility] SW referral for possible transportation assistance. Patient gave verbal consent for services. He declined needing any other [facility] services at this time. RN CM discussed with patient that [facility] SW staff will only make three phone calls to try to reach him and stressed the importance of him answering his phone calls. Also, advised patient that his voicemail is full and needs to be cleared out so that he can receive voicemail messages and return calls. He voiced understanding and ended the call.      Plan: RN CM will send  [facility] SW referral for possible transportation assistance.  Enzo Montgomery, RN,BSN,CCM [place] [facility] Telephonic Care Management Coordinator Direct Phone: [phone] Toll Free: [phone] Fax: [phone]

## 2017-11-25 ENCOUNTER — Inpatient Hospital Stay: Payer: Self-pay | Admitting: Family Medicine

## 2017-11-27 ENCOUNTER — Other Ambulatory Visit: Payer: Self-pay

## 2017-11-27 NOTE — Patient Outreach (Signed)
[facility]) Care Management  11/27/2017  JOHNTA COUTS [birth year] [id number]  Successful outreach to the patient on today's date, HIPAA identifiers confirmed. BSW introduced self and the reason for today's call, indicating the patient had been referred for assistance with transportation. The patient stated "no I don't think so". BSW reminded the patient that his physician had referred him for transportation resources. The patient denies any transportation needs and wishes to end today's call.   Plan: BSW to perform a case closure and update the patients primary physician.  Daneen Schick, BSW, CDP [facility] [phone]

## 2017-11-30 ENCOUNTER — Inpatient Hospital Stay
Admission: EM | Admit: 2017-11-30 | Discharge: 2017-12-03 | DRG: 481 | Disposition: A | Discharge status: Skilled Nursing Facility | Payer: Medicare HMO | Attending: Internal Medicine | Admitting: Internal Medicine

## 2017-11-30 ENCOUNTER — Emergency Department: Payer: Medicare HMO

## 2017-11-30 ENCOUNTER — Encounter: Payer: Self-pay | Admitting: Radiology

## 2017-11-30 ENCOUNTER — Other Ambulatory Visit: Payer: Self-pay

## 2017-11-30 DIAGNOSIS — Z23 Encounter for immunization: Secondary | ICD-10-CM | POA: Diagnosis not present

## 2017-11-30 DIAGNOSIS — Y92009 Unspecified place in unspecified non-institutional (private) residence as the place of occurrence of the external cause: Secondary | ICD-10-CM

## 2017-11-30 DIAGNOSIS — R69 Illness, unspecified: Secondary | ICD-10-CM | POA: Diagnosis not present

## 2017-11-30 DIAGNOSIS — R413 Other amnesia: Secondary | ICD-10-CM | POA: Diagnosis present

## 2017-11-30 DIAGNOSIS — Z79899 Other long term (current) drug therapy: Secondary | ICD-10-CM

## 2017-11-30 DIAGNOSIS — D638 Anemia in other chronic diseases classified elsewhere: Secondary | ICD-10-CM | POA: Diagnosis present

## 2017-11-30 DIAGNOSIS — M25552 Pain in left hip: Secondary | ICD-10-CM | POA: Diagnosis not present

## 2017-11-30 DIAGNOSIS — Y900 Blood alcohol level of less than 20 mg/100 ml: Secondary | ICD-10-CM | POA: Diagnosis not present

## 2017-11-30 DIAGNOSIS — I129 Hypertensive chronic kidney disease with stage 1 through stage 4 chronic kidney disease, or unspecified chronic kidney disease: Secondary | ICD-10-CM | POA: Diagnosis not present

## 2017-11-30 DIAGNOSIS — E876 Hypokalemia: Secondary | ICD-10-CM | POA: Diagnosis present

## 2017-11-30 DIAGNOSIS — A0472 Enterocolitis due to Clostridium difficile, not specified as recurrent: Secondary | ICD-10-CM | POA: Diagnosis present

## 2017-11-30 DIAGNOSIS — E569 Vitamin deficiency, unspecified: Secondary | ICD-10-CM | POA: Diagnosis not present

## 2017-11-30 DIAGNOSIS — F17200 Nicotine dependence, unspecified, uncomplicated: Secondary | ICD-10-CM | POA: Diagnosis present

## 2017-11-30 DIAGNOSIS — M1990 Unspecified osteoarthritis, unspecified site: Secondary | ICD-10-CM | POA: Diagnosis present

## 2017-11-30 DIAGNOSIS — R05 Cough: Secondary | ICD-10-CM

## 2017-11-30 DIAGNOSIS — S199XXA Unspecified injury of neck, initial encounter: Secondary | ICD-10-CM | POA: Diagnosis not present

## 2017-11-30 DIAGNOSIS — D509 Iron deficiency anemia, unspecified: Secondary | ICD-10-CM | POA: Diagnosis not present

## 2017-11-30 DIAGNOSIS — E43 Unspecified severe protein-calorie malnutrition: Secondary | ICD-10-CM

## 2017-11-30 DIAGNOSIS — E872 Acidosis: Secondary | ICD-10-CM | POA: Diagnosis not present

## 2017-11-30 DIAGNOSIS — S72145A Nondisplaced intertrochanteric fracture of left femur, initial encounter for closed fracture: Secondary | ICD-10-CM | POA: Diagnosis not present

## 2017-11-30 DIAGNOSIS — Z85820 Personal history of malignant melanoma of skin: Secondary | ICD-10-CM

## 2017-11-30 DIAGNOSIS — S0990XA Unspecified injury of head, initial encounter: Secondary | ICD-10-CM | POA: Diagnosis not present

## 2017-11-30 DIAGNOSIS — N179 Acute kidney failure, unspecified: Secondary | ICD-10-CM | POA: Diagnosis not present

## 2017-11-30 DIAGNOSIS — Z419 Encounter for procedure for purposes other than remedying health state, unspecified: Secondary | ICD-10-CM

## 2017-11-30 DIAGNOSIS — S72002A Fracture of unspecified part of neck of left femur, initial encounter for closed fracture: Secondary | ICD-10-CM | POA: Diagnosis not present

## 2017-11-30 DIAGNOSIS — K219 Gastro-esophageal reflux disease without esophagitis: Secondary | ICD-10-CM | POA: Diagnosis not present

## 2017-11-30 DIAGNOSIS — D72829 Elevated white blood cell count, unspecified: Secondary | ICD-10-CM | POA: Diagnosis present

## 2017-11-30 DIAGNOSIS — S299XXA Unspecified injury of thorax, initial encounter: Secondary | ICD-10-CM | POA: Diagnosis not present

## 2017-11-30 DIAGNOSIS — S72009A Fracture of unspecified part of neck of unspecified femur, initial encounter for closed fracture: Secondary | ICD-10-CM

## 2017-11-30 DIAGNOSIS — I10 Essential (primary) hypertension: Secondary | ICD-10-CM | POA: Diagnosis not present

## 2017-11-30 DIAGNOSIS — R748 Abnormal levels of other serum enzymes: Secondary | ICD-10-CM | POA: Diagnosis present

## 2017-11-30 DIAGNOSIS — I4581 Long QT syndrome: Secondary | ICD-10-CM | POA: Diagnosis present

## 2017-11-30 DIAGNOSIS — N419 Inflammatory disease of prostate, unspecified: Secondary | ICD-10-CM | POA: Diagnosis not present

## 2017-11-30 DIAGNOSIS — S72142A Displaced intertrochanteric fracture of left femur, initial encounter for closed fracture: Secondary | ICD-10-CM | POA: Diagnosis not present

## 2017-11-30 DIAGNOSIS — R059 Cough, unspecified: Secondary | ICD-10-CM

## 2017-11-30 DIAGNOSIS — N183 Chronic kidney disease, stage 3 (moderate): Secondary | ICD-10-CM | POA: Diagnosis not present

## 2017-11-30 DIAGNOSIS — S72145D Nondisplaced intertrochanteric fracture of left femur, subsequent encounter for closed fracture with routine healing: Secondary | ICD-10-CM | POA: Diagnosis not present

## 2017-11-30 DIAGNOSIS — R52 Pain, unspecified: Secondary | ICD-10-CM | POA: Diagnosis not present

## 2017-11-30 DIAGNOSIS — F1010 Alcohol abuse, uncomplicated: Secondary | ICD-10-CM | POA: Diagnosis present

## 2017-11-30 DIAGNOSIS — S72141A Displaced intertrochanteric fracture of right femur, initial encounter for closed fracture: Secondary | ICD-10-CM | POA: Diagnosis not present

## 2017-11-30 DIAGNOSIS — Z886 Allergy status to analgesic agent status: Secondary | ICD-10-CM | POA: Diagnosis not present

## 2017-11-30 DIAGNOSIS — S72145S Nondisplaced intertrochanteric fracture of left femur, sequela: Secondary | ICD-10-CM | POA: Diagnosis not present

## 2017-11-30 DIAGNOSIS — Z743 Need for continuous supervision: Secondary | ICD-10-CM | POA: Diagnosis not present

## 2017-11-30 DIAGNOSIS — W109XXA Fall (on) (from) unspecified stairs and steps, initial encounter: Secondary | ICD-10-CM | POA: Diagnosis not present

## 2017-11-30 DIAGNOSIS — M6281 Muscle weakness (generalized): Secondary | ICD-10-CM | POA: Diagnosis not present

## 2017-11-30 DIAGNOSIS — W19XXXA Unspecified fall, initial encounter: Secondary | ICD-10-CM | POA: Diagnosis not present

## 2017-11-30 DIAGNOSIS — S72002D Fracture of unspecified part of neck of left femur, subsequent encounter for closed fracture with routine healing: Secondary | ICD-10-CM | POA: Diagnosis not present

## 2017-11-30 DIAGNOSIS — Z720 Tobacco use: Secondary | ICD-10-CM | POA: Diagnosis not present

## 2017-11-30 DIAGNOSIS — R079 Chest pain, unspecified: Secondary | ICD-10-CM | POA: Diagnosis not present

## 2017-11-30 LAB — URINALYSIS, COMPLETE (UACMP) WITH MICROSCOPIC
Bacteria, UA: NONE SEEN
Bilirubin Urine: NEGATIVE
Glucose, UA: NEGATIVE mg/dL
KETONES UR: NEGATIVE mg/dL
Leukocytes, UA: NEGATIVE
Nitrite: NEGATIVE
PH: 5 (ref 5.0–8.0)
PROTEIN: 30 mg/dL — AB
SPECIFIC GRAVITY, URINE: 1.03 (ref 1.005–1.030)

## 2017-11-30 LAB — COMPREHENSIVE METABOLIC PANEL
ALK PHOS: 62 U/L (ref 38–126)
ALT: 18 U/L (ref 0–44)
AST: 32 U/L (ref 15–41)
Albumin: 3.4 g/dL — ABNORMAL LOW (ref 3.5–5.0)
Anion gap: 13 (ref 5–15)
BILIRUBIN TOTAL: 0.7 mg/dL (ref 0.3–1.2)
BUN: 30 mg/dL — ABNORMAL HIGH (ref 8–23)
CALCIUM: 8.9 mg/dL (ref 8.9–10.3)
CO2: 19 mmol/L — AB (ref 22–32)
CREATININE: 3.7 mg/dL — AB (ref 0.61–1.24)
Chloride: 110 mmol/L (ref 98–111)
GFR, EST AFRICAN AMERICAN: 16 mL/min — AB (ref >60)
GFR, EST NON AFRICAN AMERICAN: 14 mL/min — AB (ref >60)
Glucose, Bld: 124 mg/dL — ABNORMAL HIGH (ref 70–99)
Potassium: 3.7 mmol/L (ref 3.5–5.1)
Sodium: 142 mmol/L (ref 135–145)
TOTAL PROTEIN: 6.5 g/dL (ref 6.5–8.1)

## 2017-11-30 LAB — CBC WITH DIFFERENTIAL/PLATELET
Abs Immature Granulocytes: 0.19 10*3/uL — ABNORMAL HIGH (ref 0.00–0.07)
Basophils Absolute: 0 10*3/uL (ref 0.0–0.1)
Basophils Relative: 0 %
EOS PCT: 1 %
Eosinophils Absolute: 0.1 10*3/uL (ref 0.0–0.5)
HEMATOCRIT: 31 % — AB (ref 39.0–52.0)
HEMOGLOBIN: 9.6 g/dL — AB (ref 13.0–17.0)
Immature Granulocytes: 2 %
LYMPHS PCT: 9 %
Lymphs Abs: 1.2 10*3/uL (ref 0.7–4.0)
MCH: 31.9 pg (ref 26.0–34.0)
MCHC: 31 g/dL (ref 30.0–36.0)
MCV: 103 fL — AB (ref 80.0–100.0)
MONO ABS: 1.1 10*3/uL — AB (ref 0.1–1.0)
MONOS PCT: 8 %
NEUTROS ABS: 10 10*3/uL — AB (ref 1.7–7.7)
Neutrophils Relative %: 80 %
Platelets: 376 10*3/uL (ref 150–400)
RBC: 3.01 MIL/uL — ABNORMAL LOW (ref 4.22–5.81)
RDW: 15.7 % — ABNORMAL HIGH (ref 11.5–15.5)
WBC: 12.6 10*3/uL — ABNORMAL HIGH (ref 4.0–10.5)
nRBC: 0 % (ref 0.0–0.2)

## 2017-11-30 LAB — PHOSPHORUS: Phosphorus: 3 mg/dL (ref 2.5–4.6)

## 2017-11-30 LAB — URINE DRUG SCREEN, QUALITATIVE (ARMC ONLY)
BARBITURATES, UR SCREEN: NOT DETECTED
MDMA (Ecstasy)Ur Screen: NOT DETECTED
Methadone Scn, Ur: NOT DETECTED
Tricyclic, Ur Screen: NOT DETECTED

## 2017-11-30 LAB — PROTIME-INR
INR: 1.14
PROTHROMBIN TIME: 14.5 s (ref 11.4–15.2)

## 2017-11-30 LAB — TYPE AND SCREEN
ABO/RH(D): A POS
ANTIBODY SCREEN: NEGATIVE

## 2017-11-30 LAB — ETHANOL: Alcohol, Ethyl (B): <10 mg/dL (ref <10)

## 2017-11-30 LAB — SURGICAL PCR SCREEN
MRSA, PCR: NEGATIVE
Staphylococcus aureus: POSITIVE — AB

## 2017-11-30 LAB — ALBUMIN: Albumin: 3.2 g/dL — ABNORMAL LOW (ref 3.5–5.0)

## 2017-11-30 LAB — MAGNESIUM: MAGNESIUM: 2.1 mg/dL (ref 1.7–2.4)

## 2017-11-30 LAB — APTT: aPTT: 29 seconds (ref 24–36)

## 2017-11-30 LAB — CK: CK TOTAL: 441 U/L — AB (ref 49–397)

## 2017-11-30 LAB — LIPASE, BLOOD: Lipase: 20 U/L (ref 11–51)

## 2017-11-30 MED ORDER — LORAZEPAM 2 MG/ML IJ SOLN: 1.0000 mg | Freq: Four times a day (QID) | INTRAMUSCULAR | Status: DC | PRN

## 2017-11-30 MED ORDER — NICOTINE 14 MG/24HR TD PT24
14.0000 mg | MEDICATED_PATCH | Freq: Every day | TRANSDERMAL | Status: DC
  Administered 2017-11-30 – 2017-12-03 (×4): 14 mg via TRANSDERMAL
  Filled 2017-11-30 (×4): qty 1

## 2017-11-30 MED ORDER — METHOCARBAMOL 1000 MG/10ML IJ SOLN
500.0000 mg | Freq: Four times a day (QID) | INTRAVENOUS | Status: DC | PRN
  Filled 2017-11-30: qty 5

## 2017-11-30 MED ORDER — CHLORHEXIDINE GLUCONATE CLOTH 2 % EX PADS
6.0000 | MEDICATED_PAD | Freq: Every day | CUTANEOUS | Status: DC
  Administered 2017-11-30: 6 via TOPICAL

## 2017-11-30 MED ORDER — TAMSULOSIN HCL 0.4 MG PO CAPS
0.4000 mg | ORAL_CAPSULE | Freq: Every day | ORAL | Status: DC
  Administered 2017-12-02 – 2017-12-03 (×2): 0.4 mg via ORAL
  Filled 2017-11-30 (×3): qty 1

## 2017-11-30 MED ORDER — LORAZEPAM 1 MG PO TABS: 1.0000 mg | ORAL_TABLET | Freq: Four times a day (QID) | ORAL | Status: DC | PRN

## 2017-11-30 MED ORDER — FERROUS SULFATE 325 (65 FE) MG PO TABS
325.0000 mg | ORAL_TABLET | Freq: Two times a day (BID) | ORAL | Status: DC
  Administered 2017-11-30 – 2017-12-03 (×4): 325 mg via ORAL
  Filled 2017-11-30 (×5): qty 1

## 2017-11-30 MED ORDER — ADULT MULTIVITAMIN W/MINERALS CH
1.0000 | ORAL_TABLET | Freq: Every day | ORAL | Status: DC
  Administered 2017-12-02 – 2017-12-03 (×2): 1 via ORAL
  Filled 2017-11-30 (×3): qty 1

## 2017-11-30 MED ORDER — NICOTINE 14 MG/24HR TD PT24: 14.0000 mg | MEDICATED_PATCH | Freq: Every day | TRANSDERMAL | Status: DC

## 2017-11-30 MED ORDER — TRAZODONE HCL 50 MG PO TABS: 25.0000 mg | ORAL_TABLET | Freq: Every evening | ORAL | Status: DC | PRN

## 2017-11-30 MED ORDER — FENTANYL CITRATE (PF) 100 MCG/2ML IJ SOLN: 50.0000 ug | INTRAMUSCULAR | Status: DC | PRN

## 2017-11-30 MED ORDER — MUPIROCIN 2 % EX OINT
1.0000 "application " | TOPICAL_OINTMENT | Freq: Two times a day (BID) | CUTANEOUS | Status: DC
  Administered 2017-12-01 – 2017-12-03 (×5): 1 via NASAL
  Filled 2017-11-30: qty 22

## 2017-11-30 MED ORDER — VITAMIN B-12 1000 MCG PO TABS
1000.0000 ug | ORAL_TABLET | Freq: Every day | ORAL | Status: DC
  Administered 2017-12-02 – 2017-12-03 (×2): 1000 ug via ORAL
  Filled 2017-11-30 (×3): qty 1

## 2017-11-30 MED ORDER — DEXTROSE-NACL 5-0.9 % IV SOLN
INTRAVENOUS | Status: DC
  Administered 2017-11-30 – 2017-12-01 (×2): via INTRAVENOUS

## 2017-11-30 MED ORDER — MORPHINE SULFATE (PF) 4 MG/ML IV SOLN
4.0000 mg | INTRAVENOUS | Status: DC | PRN
  Administered 2017-11-30 (×2): 4 mg via INTRAVENOUS
  Filled 2017-11-30 (×2): qty 1

## 2017-11-30 MED ORDER — HYDROCODONE-ACETAMINOPHEN 5-325 MG PO TABS
1.0000 | ORAL_TABLET | ORAL | Status: DC | PRN
  Administered 2017-11-30: 1 via ORAL
  Filled 2017-11-30: qty 1

## 2017-11-30 MED ORDER — SODIUM CHLORIDE 0.9 % IV SOLN
Freq: Once | INTRAVENOUS | Status: AC
  Administered 2017-11-30: 14:00:00 via INTRAVENOUS

## 2017-11-30 MED ORDER — VITAMIN B-1 100 MG PO TABS
100.0000 mg | ORAL_TABLET | Freq: Every day | ORAL | Status: DC
  Administered 2017-12-02 – 2017-12-03 (×2): 100 mg via ORAL
  Filled 2017-11-30 (×3): qty 1

## 2017-11-30 MED ORDER — CYCLOBENZAPRINE HCL 10 MG PO TABS
5.0000 mg | ORAL_TABLET | Freq: Three times a day (TID) | ORAL | Status: DC | PRN
  Administered 2017-12-01 (×2): 5 mg via ORAL
  Filled 2017-11-30 (×2): qty 1

## 2017-11-30 MED ORDER — FAMOTIDINE 20 MG PO TABS
20.0000 mg | ORAL_TABLET | Freq: Two times a day (BID) | ORAL | Status: DC
  Administered 2017-11-30: 20 mg via ORAL
  Filled 2017-11-30 (×2): qty 1

## 2017-11-30 MED ORDER — BISACODYL 5 MG PO TBEC: 5.0000 mg | DELAYED_RELEASE_TABLET | Freq: Every day | ORAL | Status: DC | PRN

## 2017-11-30 MED ORDER — ALBUTEROL SULFATE (2.5 MG/3ML) 0.083% IN NEBU: 2.5000 mg | INHALATION_SOLUTION | RESPIRATORY_TRACT | Status: DC | PRN

## 2017-11-30 MED ORDER — THIAMINE HCL 100 MG/ML IJ SOLN
100.0000 mg | Freq: Every day | INTRAMUSCULAR | Status: DC
  Administered 2017-12-01: 100 mg via INTRAVENOUS
  Filled 2017-11-30: qty 2

## 2017-11-30 MED ORDER — ONDANSETRON HCL 4 MG PO TABS: 4.0000 mg | ORAL_TABLET | Freq: Four times a day (QID) | ORAL | Status: DC | PRN

## 2017-11-30 MED ORDER — INFLUENZA VAC SPLIT HIGH-DOSE 0.5 ML IM SUSY
0.5000 mL | PREFILLED_SYRINGE | INTRAMUSCULAR | Status: AC
  Administered 2017-12-02: 0.5 mL via INTRAMUSCULAR
  Filled 2017-11-30: qty 0.5

## 2017-11-30 MED ORDER — CEFAZOLIN SODIUM-DEXTROSE 2-4 GM/100ML-% IV SOLN
2.0000 g | INTRAVENOUS | Status: AC
  Administered 2017-12-01: 2 g via INTRAVENOUS
  Filled 2017-11-30 (×2): qty 100

## 2017-11-30 MED ORDER — DOCUSATE SODIUM 100 MG PO CAPS
100.0000 mg | ORAL_CAPSULE | Freq: Two times a day (BID) | ORAL | Status: DC
  Administered 2017-11-30: 100 mg via ORAL
  Filled 2017-11-30 (×2): qty 1

## 2017-11-30 MED ORDER — ACETAMINOPHEN 325 MG PO TABS: 650.0000 mg | ORAL_TABLET | Freq: Four times a day (QID) | ORAL | Status: DC | PRN

## 2017-11-30 MED ORDER — METHOCARBAMOL 500 MG PO TABS: 500.0000 mg | ORAL_TABLET | Freq: Four times a day (QID) | ORAL | Status: DC | PRN

## 2017-11-30 MED ORDER — FOLIC ACID 1 MG PO TABS
1.0000 mg | ORAL_TABLET | Freq: Every day | ORAL | Status: DC
  Administered 2017-12-02 – 2017-12-03 (×2): 1 mg via ORAL
  Filled 2017-11-30 (×3): qty 1

## 2017-11-30 MED ORDER — ONDANSETRON HCL 4 MG/2ML IJ SOLN: 4.0000 mg | Freq: Four times a day (QID) | INTRAMUSCULAR | Status: DC | PRN

## 2017-11-30 MED ORDER — ACETAMINOPHEN 650 MG RE SUPP: 650.0000 mg | Freq: Four times a day (QID) | RECTAL | Status: DC | PRN

## 2017-11-30 MED ORDER — SENNOSIDES-DOCUSATE SODIUM 8.6-50 MG PO TABS: 1.0000 | ORAL_TABLET | Freq: Every evening | ORAL | Status: DC | PRN

## 2017-11-30 MED ORDER — IOHEXOL 300 MG/ML  SOLN
75.0000 mL | Freq: Once | INTRAMUSCULAR | Status: AC | PRN
  Administered 2017-11-30: 75 mL via INTRAVENOUS

## 2017-11-30 NOTE — Consult Note (Signed)
ORTHOPAEDIC CONSULTATION  REQUESTING PHYSICIAN: Demetrios Loll, MD  Chief Complaint: Left hip fracture  HPI: Justin Andrews is a 83 y.o. male who complains of left hip pain after a fall early this AM.  Patient is seen in room 14 of the ER.  No one is at the bedside.  Patient is inebriated and unable to provide an accurate history but states he fell on concrete.  He complains of pain in the left hip but denies numbness or tingling or other areas of pain.   Past Medical History:  Diagnosis Date  . Elevated serum creatinine   . History of melanoma   . Hypertension   . Osteoarthritis   . Tobacco abuse    Past Surgical History:  Procedure Laterality Date  . ANKLE FRACTURE SURGERY    . EXTERNAL EAR SURGERY    . ROTATOR CUFF REPAIR    . TONSILLECTOMY    . TYMPANOPLASTY W/ MASTOIDECTOMY     Patient with reports of mastoid surgery (appears to have had surgery on TM; Left).   Social History   Socioeconomic History  . Marital status: Divorced    Spouse name: Not on file  . Number of children: Not on file  . Years of education: Not on file  . Highest education level: Not on file  Occupational History  . Not on file  Social Needs  . Financial resource strain: Not hard at all  . Food insecurity:    Worry: Never true    Inability: Never true  . Transportation needs:    Medical: No    Non-medical: No  Tobacco Use  . Smoking status: Current Every Day Smoker    Packs/day: 1.00    Years: 70.00    Pack years: 70.00    Types: Cigarettes  . Smokeless tobacco: Never Used  Substance and Sexual Activity  . Alcohol use: No  . Drug use: No  . Sexual activity: Never  Lifestyle  . Physical activity:    Days per week: 1 day    Minutes per session: 90 min  . Stress: Not at all  Relationships  . Social connections:    Talks on phone: Not on file    Gets together: Not on file    Attends religious service: Not on file    Active member of club or organization: Not on file    Attends  meetings of clubs or organizations: Not on file    Relationship status: Not on file  Other Topics Concern  . Not on file  Social History Narrative  . Not on file   Family History  Problem Relation Age of Onset  . Prostate cancer Neg Hx   . Bladder Cancer Neg Hx   . Kidney cancer Neg Hx    Allergies  Allergen Reactions  . Aspirin Other (See Comments)    bleeding   Prior to Admission medications   Medication Sig Start Date End Date Taking? Authorizing Provider  amLODipine (NORVASC) 10 MG tablet Take 1 tablet (10 mg total) by mouth daily. 09/24/17   Leone Haven, MD  carvedilol (COREG) 6.25 MG tablet TAKE 1 TABLET (6.25 MG TOTAL) BY MOUTH 2 (TWO) TIMES DAILY WITH A MEAL. 07/17/17   Leone Haven, MD  [employer] VITAMIN B12 1000 MCG tablet TAKE 1 TABLET BY MOUTH EVERY DAY Patient taking differently: Take 1,000 mcg by mouth daily.  11/19/17   Leone Haven, MD  ferrous sulfate 325 (65 FE) MG tablet Take 1 tablet (  325 mg total) by mouth 2 (two) times daily. 10/17/16   Leone Haven, MD  folic acid (FOLVITE) 1 MG tablet TAKE 1 TABLET BY MOUTH EVERY DAY 02/05/17   Leone Haven, MD  lisinopril (PRINIVIL,ZESTRIL) 20 MG tablet Take 20 mg by mouth daily. 07/11/16   [provider]  ranitidine (ZANTAC) 150 MG tablet Take 150 mg by mouth 2 (two) times daily.    [provider]  sertraline (ZOLOFT) 100 MG tablet TAKE 1.5 TABLETS (150 MG TOTAL) BY MOUTH DAILY. Patient not taking: Reported on 11/19/2017 06/23/17   Leone Haven, MD  tamsulosin (FLOMAX) 0.4 MG CAPS capsule Take 1 capsule (0.4 mg total) by mouth daily. 10/25/17   Leone Haven, MD  traMADol (ULTRAM) 50 MG tablet TAKE 2 TABLETS BY MOUTH EVERY 8 HOURS Patient taking differently: Take 100 mg by mouth every 8 (eight) hours. TAKE 2 TABLETS BY MOUTH EVERY 8 HOURS 10/24/17   Leone Haven, MD  traZODone (DESYREL) 50 MG tablet Take 0.5 tablets (25 mg total) by mouth at bedtime as needed for sleep.  09/17/17   Burnard Hawthorne, FNP   Ct Head Wo Contrast  Result Date: 11/30/2017 CLINICAL DATA:  Minor head trauma EXAM: CT HEAD WITHOUT CONTRAST CT CERVICAL SPINE WITHOUT CONTRAST TECHNIQUE: Multidetector CT imaging of the head and cervical spine was performed following the standard protocol without intravenous contrast. Multiplanar CT image reconstructions of the cervical spine were also generated. COMPARISON:  11/12/2009 head CT FINDINGS: CT HEAD FINDINGS Brain: No evidence of acute infarction, hemorrhage, hydrocephalus, extra-axial collection or mass lesion/mass effect. Moderate low-density in the cerebral white matter attributed to chronic small vessel ischemia. Cerebral volume loss that is generalized Vascular: Atherosclerotic calcification Skull: Negative for fracture. Left mastoidectomy with chronically opacified bowl. The middle ear is also opacified today, progressed from 2011. Orbits: No evidence of injury CT CERVICAL SPINE FINDINGS Alignment: No traumatic malalignment. Skull base and vertebrae: Negative for fracture Soft tissues and spinal canal: No prevertebral fluid or swelling. No visible canal hematoma. Disc levels: Diffuse degenerative disc narrowing and ridging with foraminal stenosis. Upper chest: Emphysema IMPRESSION: 1. No evidence of acute intracranial or cervical spine injury. 2. Chronic findings are described above. Electronically Signed   By: Monte Fantasia M.D.   On: 11/30/2017 13:48   Ct Cervical Spine Wo Contrast  Result Date: 11/30/2017 CLINICAL DATA:  Minor head trauma EXAM: CT HEAD WITHOUT CONTRAST CT CERVICAL SPINE WITHOUT CONTRAST TECHNIQUE: Multidetector CT imaging of the head and cervical spine was performed following the standard protocol without intravenous contrast. Multiplanar CT image reconstructions of the cervical spine were also generated. COMPARISON:  11/12/2009 head CT FINDINGS: CT HEAD FINDINGS Brain: No evidence of acute infarction, hemorrhage, hydrocephalus,  extra-axial collection or mass lesion/mass effect. Moderate low-density in the cerebral white matter attributed to chronic small vessel ischemia. Cerebral volume loss that is generalized Vascular: Atherosclerotic calcification Skull: Negative for fracture. Left mastoidectomy with chronically opacified bowl. The middle ear is also opacified today, progressed from 2011. Orbits: No evidence of injury CT CERVICAL SPINE FINDINGS Alignment: No traumatic malalignment. Skull base and vertebrae: Negative for fracture Soft tissues and spinal canal: No prevertebral fluid or swelling. No visible canal hematoma. Disc levels: Diffuse degenerative disc narrowing and ridging with foraminal stenosis. Upper chest: Emphysema IMPRESSION: 1. No evidence of acute intracranial or cervical spine injury. 2. Chronic findings are described above. Electronically Signed   By: Monte Fantasia M.D.   On: 11/30/2017 13:48  Ct Abdomen Pelvis W Contrast  Result Date: 11/30/2017 CLINICAL DATA:  Pain after fall. EXAM: CT ABDOMEN AND PELVIS WITH CONTRAST TECHNIQUE: Multidetector CT imaging of the abdomen and pelvis was performed using the standard protocol following bolus administration of intravenous contrast. CONTRAST:  93mL OMNIPAQUE IOHEXOL 300 MG/ML  SOLN COMPARISON:  November 21, 2016 FINDINGS: Lower chest: No acute abnormality. Hepatobiliary: No focal liver abnormality is seen. No gallstones, gallbladder wall thickening, or biliary dilatation. Pancreas: Fatty deposition in the pancreatic head, neck, and uncinate process. No acute pancreatic abnormality noted. Spleen: Normal in size without focal abnormality. Adrenals/Urinary Tract: Adrenal glands are normal. Multiple renal masses identified in the left kidney, consistent with cysts. No suspicious masses. No stones or hydronephrosis. No perinephric stranding. The ureters and bladder are normal. Stomach/Bowel: The stomach is normal. A duodenal diverticulum is much smaller in the interval  with no evidence of active inflammation. Small bowel otherwise normal. The colon and appendix are normal. Vascular/Lymphatic: Atherosclerotic changes are seen in the nonaneurysmal aorta. No adenopathy. Reproductive: Prostate is unremarkable. Other: No abdominal wall hernia or abnormality. No abdominopelvic ascites. Musculoskeletal: There is a comminuted fracture of the proximal left femur, extending through the intertrochanteric region with angulation. No dislocation. No other fractures. IMPRESSION: 1. Comminuted fracture through the intertrochanteric region of the left femur. No dislocation. 2. Renal cysts. 3. Atherosclerotic changes in the abdominal aorta. 4. No other acute abnormalities. Electronically Signed   By: Dorise Bullion III M.D   On: 11/30/2017 13:58   Dg Chest Portable 1 View  Result Date: 11/30/2017 CLINICAL DATA:  Pain after fall EXAM: PORTABLE CHEST 1 VIEW COMPARISON:  November 19, 2017 FINDINGS: The heart size and mediastinal contours are within normal limits. Both lungs are clear. The visualized skeletal structures are unremarkable. IMPRESSION: No active disease. Electronically Signed   By: Dorise Bullion III M.D   On: 11/30/2017 14:01   Dg Hip Unilat W Or Wo Pelvis 2-3 Views Left  Result Date: 11/30/2017 CLINICAL DATA:  Pain after fall EXAM: DG HIP (WITH OR WITHOUT PELVIS) 2-3V LEFT COMPARISON:  None. FINDINGS: There is a comminuted fracture through the left femoral neck, extending through the intertrochanteric region with angulation. No dislocation. 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 No other fractures identified. IMPRESSION: Left proximal femoral fracture as above. Electronically Signed   By: Dorise Bullion III M.D   On: 11/30/2017 14:00    Positive ROS: All other systems have been reviewed and were otherwise negative with the exception of those mentioned in the HPI and as above.  Physical Exam: General: Awake, inebriated, no acute distress  MUSCULOSKELETAL: Left lower  extremity:  Patient wearing flannel pants.  No evidence of bleeding.  Compartments soft and compressible.  There is shortening and external rotation to the left lower extremity.  Patient has palpable pedal pulses, intact sensation to light touch and intact motor function in the left lower extremity.    Assessment: Comminuted left basicervical fracture extending into the lesser trochanter  Plan: Patient is at moderate risk for hip surgery per hospitalist.  Patient has elevated creatinine consistent with chronic kidney disease.  Patient will be treated with intramedullary fixation of his left hip fracture tomorrow.  Hold anticoagulation medication.  CIWA protocol initiated for alcohol abuse.  Smoking cessation will be important for bone healing.   NPO after midnight.      Thornton Park, MD    11/30/2017 4:03 PM

## 2017-11-30 NOTE — ED Notes (Signed)
Pt was heard calling out from room. Pt moved up on stretcher. Pt states he drank vodka last night at 9:30p. Pt speech is hard to understand but pt is talking in complete sentences.

## 2017-11-30 NOTE — ED Notes (Signed)
Pt in CT at this time, will obtain temperature when pt returns.

## 2017-11-30 NOTE — ED Provider Notes (Signed)
[facility] Emergency Department Provider Note    First MD Initiated Contact with Patient 11/30/17 1236     (approximate)  I have reviewed the triage vital signs and the nursing notes.   HISTORY  Chief Complaint Fall    HPI Justin Andrews is a 83 y.o. male presents to the ER for evaluation of left side pain hip pain.  Patient reportedly fell down 6 steps this morning around 4 AM.  Reportedly there was alcohol involved.  Patient amnestic to the event.  EMS found patient lying in the ground outside.  Complaining of moderate to severe left left hip pain.  Reportedly is on blood thinners but cannot recall what type of medication.  Denies any chest pain.  No abdominal pain.    Past Medical History:  Diagnosis Date  . Elevated serum creatinine   . History of melanoma   . Hypertension   . Osteoarthritis   . Tobacco abuse    Family History  Problem Relation Age of Onset  . Prostate cancer Neg Hx   . Bladder Cancer Neg Hx   . Kidney cancer Neg Hx    Past Surgical History:  Procedure Laterality Date  . ANKLE FRACTURE SURGERY    . EXTERNAL EAR SURGERY    . ROTATOR CUFF REPAIR    . TONSILLECTOMY    . TYMPANOPLASTY W/ MASTOIDECTOMY     Patient with reports of mastoid surgery (appears to have had surgery on TM; Left).   Patient Active Problem List   Diagnosis Date Noted  . Closed left hip fracture ([place]) 11/30/2017  . Diarrhea 11/15/2017  . Duodenum disorder 11/15/2017  . Frequency of urination 10/21/2017  . Cigarette smoker 10/06/2017  . Cough 09/25/2017  . Changes in vision 09/25/2017  . Proteinuria 09/25/2017  . Basal cell carcinoma 02/28/2017  . 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 DOE (dyspnea on exertion) 01/17/2017  . Chronic right upper quadrant pain 11/15/2016  . Balance problem 11/15/2016  . Simple renal cyst 10/21/2016  . Prostate nodule 10/16/2016  . Dysuria 10/16/2016  . CKD (chronic kidney disease) stage 3, GFR 30-59 ml/min  (HCC) 08/05/2016  . Anemia 08/05/2016  . Anxiety and depression 08/05/2016  . Weight loss 08/05/2016  . Sleep disturbance 07/23/2016  . Hypertension   . Osteoarthritis   . Tobacco abuse   . 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 Authorizing Provider  amLODipine (NORVASC) 10 MG tablet Take 1 tablet (10 mg total) by mouth daily. 09/24/17   Leone Haven, MD  carvedilol (COREG) 6.25 MG tablet TAKE 1 TABLET (6.25 MG TOTAL) BY MOUTH 2 (TWO) TIMES DAILY WITH A MEAL. 07/17/17   Leone Haven, MD  [employer] VITAMIN B12 1000 MCG tablet TAKE 1 TABLET BY MOUTH EVERY DAY Patient taking differently: Take 1,000 mcg by mouth daily.  11/19/17   Leone Haven, MD  ferrous sulfate 325 (65 FE) MG tablet Take 1 tablet (325 mg total) by mouth 2 (two) times daily. 10/17/16   Leone Haven, MD  folic acid (FOLVITE) 1 MG tablet TAKE 1 TABLET BY MOUTH EVERY DAY 02/05/17   Leone Haven, MD  lisinopril (PRINIVIL,ZESTRIL) 20 MG tablet Take 20 mg by mouth daily. 07/11/16   [provider]  ranitidine (ZANTAC) 150 MG tablet Take 150 mg by mouth 2 (two) times daily.    [provider]  sertraline (ZOLOFT) 100 MG tablet TAKE 1.5 TABLETS (150 MG TOTAL) BY MOUTH DAILY. Patient not taking: Reported on 11/19/2017 06/23/17   Leone Haven, MD  tamsulosin (FLOMAX) 0.4 MG CAPS capsule Take 1 capsule (0.4 mg total) by mouth daily. 10/25/17   Leone Haven, MD  traMADol (ULTRAM) 50 MG tablet TAKE 2 TABLETS BY MOUTH EVERY 8 HOURS Patient taking differently: Take 100 mg by mouth every 8 (eight) hours. TAKE 2 TABLETS BY MOUTH EVERY 8 HOURS 10/24/17   Leone Haven, MD  traZODone (DESYREL) 50 MG tablet Take 0.5 tablets (25 mg total) by mouth at bedtime as needed for sleep. 09/17/17   Burnard Hawthorne, FNP    Allergies Aspirin    Social History Social History   Tobacco Use  . Smoking  status: Current Every Day Smoker    Packs/day: 1.00    Years: 70.00    Pack years: 70.00    Types: Cigarettes  . Smokeless tobacco: Never Used  Substance Use Topics  . Alcohol use: No  . Drug use: No    Review of Systems Patient denies headaches, rhinorrhea, blurry vision, numbness, shortness of breath, chest pain, edema, cough, abdominal pain, nausea, vomiting, diarrhea, dysuria, fevers, rashes or hallucinations unless otherwise stated above in HPI. ____________________________________________   PHYSICAL EXAM:  VITAL SIGNS: Vitals:   11/30/17 1400 11/30/17 1500  BP: (!) 104/57 106/69  Pulse: 70 64  Resp: 17 17  Temp:    SpO2: 98% 95%    Constitutional: Alert frail and disheveled appearing  Eyes: Conjunctivae are normal.  Head: Atraumatic. Nose: No congestion/rhinnorhea. Mouth/Throat: Mucous membranes are moist.   Neck: No stridor. Painless ROM.  Cardiovascular: Normal rate, regular rhythm. Grossly normal heart sounds.  Good peripheral circulation. Respiratory: Normal respiratory effort.  No retractions. Lungs CTAB. Gastrointestinal: Soft and nontender. No distention. No abdominal bruits. No CVA tenderness. Genitourinary: normal external genitalia Musculoskeletal: obvious deformity with pain on log roll of left hip.  lle shortened and rotated.  2+ DP and PT pulses distally. No joint effusions. Neurologic:  Normal speech and language. No gross focal neurologic deficits are appreciated. No facial droop Skin:  Skin is warm, dry and intact. No rash noted. Psychiatric: Mood and affect are normal. Speech and behavior are normal.  ____________________________________________   LABS (all labs ordered are listed, but only abnormal results are displayed)  Results for orders placed or performed during the hospital encounter of 11/30/17 (from the past 24 hour(s))  CBC with Differential/Platelet     Status: Abnormal   Collection Time: 11/30/17 12:51 PM  Result Value Ref Range    WBC 12.6 (H) 4.0 - 10.5 K/uL   RBC 3.01 (L) 4.22 - 5.81 MIL/uL   Hemoglobin 9.6 (L) 13.0 - 17.0 g/dL   HCT 31.0 (L) 39.0 - 52.0 %   MCV 103.0 (H) 80.0 - 100.0 fL   MCH 31.9 26.0 - 34.0 pg   MCHC 31.0 30.0 - 36.0 g/dL   RDW 15.7 (H) 11.5 - 15.5 %   Platelets 376 150 - 400 K/uL   nRBC 0.0 0.0 - 0.2 %   Neutrophils Relative % 80 %   Neutro Abs 10.0 (H) 1.7 - 7.7 K/uL   Lymphocytes Relative 9 %   Lymphs Abs 1.2 0.7 - 4.0 K/uL   Monocytes Relative 8 %   Monocytes Absolute 1.1 (H) 0.1 - 1.0 K/uL   Eosinophils Relative 1 %   Eosinophils Absolute 0.1 0.0 - 0.5 K/uL  Basophils Relative 0 %   Basophils Absolute 0.0 0.0 - 0.1 K/uL   Immature Granulocytes 2 %   Abs Immature Granulocytes 0.19 (H) 0.00 - 0.07 K/uL  Comprehensive metabolic panel     Status: Abnormal   Collection Time: 11/30/17 12:51 PM  Result Value Ref Range   Sodium 142 135 - 145 mmol/L   Potassium 3.7 3.5 - 5.1 mmol/L   Chloride 110 98 - 111 mmol/L   CO2 19 (L) 22 - 32 mmol/L   Glucose, Bld 124 (H) 70 - 99 mg/dL   BUN 30 (H) 8 - 23 mg/dL   Creatinine, Ser 3.70 (H) 0.61 - 1.24 mg/dL   Calcium 8.9 8.9 - 10.3 mg/dL   Total Protein 6.5 6.5 - 8.1 g/dL   Albumin 3.4 (L) 3.5 - 5.0 g/dL   AST 32 15 - 41 U/L   ALT 18 0 - 44 U/L   Alkaline Phosphatase 62 38 - 126 U/L   Total Bilirubin 0.7 0.3 - 1.2 mg/dL   GFR calc non Af Amer 14 (L) >60 mL/min   GFR calc Af Amer 16 (L) >60 mL/min   Anion gap 13 5 - 15  Lipase, blood     Status: None   Collection Time: 11/30/17 12:51 PM  Result Value Ref Range   Lipase 20 11 - 51 U/L  CK     Status: Abnormal   Collection Time: 11/30/17 12:51 PM  Result Value Ref Range   Total CK 441 (H) 49 - 397 U/L  Type and screen Ordered by PROVIDER DEFAULT     Status: None (Preliminary result)   Collection Time: 11/30/17  1:42 PM  Result Value Ref Range   ABO/RH(D) PENDING    Antibody Screen PENDING    Sample Expiration      12/03/2017 Performed at [facility], [address]  [place], [place] [address]   Protime-INR     Status: None   Collection Time: 11/30/17  3:08 PM  Result Value Ref Range   Prothrombin Time 14.5 11.4 - 15.2 seconds   INR 1.14   Type and screen Ordered by PROVIDER DEFAULT     Status: None (Preliminary result)   Collection Time: 11/30/17  3:08 PM  Result Value Ref Range   ABO/RH(D) PENDING    Antibody Screen PENDING    Sample Expiration      12/03/2017 Performed at [facility], [address] [place], [place] [address]    ____________________________________________  EKG My review and personal interpretation at Time: 12:45   Indication: fall  Rate: 70  Rhythm: sinus Axis: normal Other: lvh criteria, no stemi ____________________________________________  RADIOLOGY  I personally reviewed all radiographic images ordered to evaluate for the above acute complaints and reviewed radiology reports and findings.  These findings were personally discussed with the patient.  Please see medical record for radiology report.  ____________________________________________   PROCEDURES  Procedure(s) performed:  Procedures    Critical Care performed: no ____________________________________________   INITIAL IMPRESSION / ASSESSMENT AND PLAN / ED COURSE  Pertinent labs & imaging results that were available during my care of the patient were reviewed by me and considered in my medical decision making (see chart for details).   DDX: sah, sdh, edh, fracture, contusion, soft tissue injury, viscous injury, concussion, hemorrhage   DANYEL GRIESS is a 83 y.o. who presents to the ED with symptoms as described above.  Patient did a reported episode of hypotension in route therefore fast  was performed upon arrival to the ER.  He is protecting his airway.  Fast was negative.  Taken to CT scanner to evaluate for pelvic injury evidence of hemorrhage.  Does have obvious displaced and comminuted left hip fracture.  Have discussed with the patient and  available family all diagnostics and treatments performed thus far and all questions were answered to the best of my ability. The patient demonstrates understanding and agreement with plan.   Clinical Course as of Dec 01 1530  Nancy Fetter Nov 30, 2017  1412 No evidence of intra-abdominal or pelvic trauma.  Does have comminuted left femur fracture.  Will consult Ortho.  Also has AKI elevated CK.  We will give IV fluids.  Does have chronic anemia.  Will continue with IV pain medication.  Will consult hospitalist. patient will be admitted.   [PR]    Clinical Course User Index [PR] Merlyn Lot, MD     As part of my medical decision making, I reviewed the following data within the [facility] notes reviewed and incorporated, Labs reviewed, notes from prior ED visits.  ____________________________________________   FINAL CLINICAL IMPRESSION(S) / ED DIAGNOSES  Final diagnoses:  Closed fracture of neck of left femur, initial encounter ([place])  AKI (acute kidney injury) ([place])  Elevated CK      NEW MEDICATIONS STARTED DURING THIS VISIT:  New Prescriptions   No medications on file     Note:  This document was prepared using Dragon voice recognition software and may include unintentional dictation errors.    Merlyn Lot, MD 11/30/17 [phone]

## 2017-11-30 NOTE — Progress Notes (Signed)
Spoke with DR. Duane Boston about urine tox results.

## 2017-11-30 NOTE — ED Notes (Signed)
Resent type and screen to lab at this time. Pt is sleeping on stretcher, hooked up to cardiac monitor. No distress noted.

## 2017-11-30 NOTE — H&P (Signed)
[facility] NAME: Justin Andrews    MR#:  [id number]  DATE OF BIRTH:  07-21-1934  DATE OF ADMISSION:  11/30/2017  PRIMARY CARE PHYSICIAN: Leone Haven, MD   REQUESTING/REFERRING PHYSICIAN: Dr. Quentin Cornwall  CHIEF COMPLAINT:   Chief Complaint  Patient presents with  . Fall   Fall and left hip pain today. HISTORY OF PRESENT ILLNESS:  Justin Andrews  is a 83 y.o. male with a known history of CKD, melanoma, hypertension, osteoarthritis and tobacco abuse.  According to RN, the patient is drunk and is not a good historian.  It is reported that the patient fell down 6 steps at about 4 AM today.  Reportedly there was alcohol involved.  The patient denies any symptoms except left hip pain.  He was found left hip fracture and renal failure.  Dr. Quentin Cornwall discussed with on-call orthopedic surgeon, who planned surgery tomorrow and requests admission.  PAST MEDICAL HISTORY:   Past Medical History:  Diagnosis Date  . Elevated serum creatinine   . History of melanoma   . Hypertension   . Osteoarthritis   . Tobacco abuse     PAST SURGICAL HISTORY:   Past Surgical History:  Procedure Laterality Date  . ANKLE FRACTURE SURGERY    . EXTERNAL EAR SURGERY    . ROTATOR CUFF REPAIR    . TONSILLECTOMY    . TYMPANOPLASTY W/ MASTOIDECTOMY     Patient with reports of mastoid surgery (appears to have had surgery on TM; Left).    SOCIAL HISTORY:   Social History   Tobacco Use  . Smoking status: Current Every Day Smoker    Packs/day: 1.00    Years: 70.00    Pack years: 70.00    Types: Cigarettes  . Smokeless tobacco: Never Used  Substance Use Topics  . Alcohol use: No    FAMILY HISTORY:   Family History  Problem Relation Age of Onset  . Prostate cancer Neg Hx   . Bladder Cancer Neg Hx   . Kidney cancer Neg Hx     DRUG ALLERGIES:   Allergies  Allergen Reactions  . Aspirin Other (See Comments)    bleeding    REVIEW OF  SYSTEMS:   Review of Systems  Constitutional: Negative for chills, fever and malaise/fatigue.  HENT: Negative for sore throat.   Eyes: Negative for blurred vision and double vision.  Respiratory: Negative for cough, hemoptysis, shortness of breath, wheezing and stridor.   Cardiovascular: Negative for chest pain, palpitations, orthopnea and leg swelling.  Gastrointestinal: Negative for abdominal pain, blood in stool, diarrhea, melena, nausea and vomiting.  Genitourinary: Negative for dysuria, flank pain and hematuria.  Musculoskeletal: Positive for joint pain. Negative for back pain.       Left hip pain  Skin: Negative for rash.  Neurological: Negative for dizziness, sensory change, focal weakness, seizures, loss of consciousness, weakness and headaches.  Endo/Heme/Allergies: Negative for polydipsia.  Psychiatric/Behavioral: Negative for depression. The patient is not nervous/anxious.     MEDICATIONS AT HOME:   Prior to Admission medications   Medication Sig Start Date End Date Taking? Authorizing Provider  amLODipine (NORVASC) 10 MG tablet Take 1 tablet (10 mg total) by mouth daily. 09/24/17   Leone Haven, MD  carvedilol (COREG) 6.25 MG tablet TAKE 1 TABLET (6.25 MG TOTAL) BY MOUTH 2 (TWO) TIMES DAILY WITH A MEAL. 07/17/17   Leone Haven, MD  [facility] VITAMIN B12 1000 MCG tablet TAKE  1 TABLET BY MOUTH EVERY DAY Patient taking differently: Take 1,000 mcg by mouth daily.  11/19/17   Leone Haven, MD  ferrous sulfate 325 (65 FE) MG tablet Take 1 tablet (325 mg total) by mouth 2 (two) times daily. 10/17/16   Leone Haven, MD  folic acid (FOLVITE) 1 MG tablet TAKE 1 TABLET BY MOUTH EVERY DAY 02/05/17   Leone Haven, MD  lisinopril (PRINIVIL,ZESTRIL) 20 MG tablet Take 20 mg by mouth daily. 07/11/16   [provider]  ranitidine (ZANTAC) 150 MG tablet Take 150 mg by mouth 2 (two) times daily.    [provider]  sertraline (ZOLOFT) 100 MG tablet TAKE 1.5  TABLETS (150 MG TOTAL) BY MOUTH DAILY. Patient not taking: Reported on 11/19/2017 06/23/17   Leone Haven, MD  tamsulosin (FLOMAX) 0.4 MG CAPS capsule Take 1 capsule (0.4 mg total) by mouth daily. 10/25/17   Leone Haven, MD  traMADol (ULTRAM) 50 MG tablet TAKE 2 TABLETS BY MOUTH EVERY 8 HOURS Patient taking differently: Take 100 mg by mouth every 8 (eight) hours. TAKE 2 TABLETS BY MOUTH EVERY 8 HOURS 10/24/17   Leone Haven, MD  traZODone (DESYREL) 50 MG tablet Take 0.5 tablets (25 mg total) by mouth at bedtime as needed for sleep. 09/17/17   Burnard Hawthorne, FNP      VITAL SIGNS:  Blood pressure (!) 104/57, pulse 70, temperature (!) 97.5 F (36.4 C), temperature source Oral, resp. rate 17, height 5\' 7"  (1.702 m), weight 63 kg, SpO2 98 %.  PHYSICAL EXAMINATION:  Physical Exam  GENERAL:  83 y.o.-year-old patient lying in the bed with no acute distress.  EYES: Pupils equal, round, reactive to light and accommodation. No scleral icterus. Extraocular muscles intact.  HEENT: Head atraumatic, normocephalic. Oropharynx and nasopharynx clear.  NECK:  Supple, no jugular venous distention. No thyroid enlargement, no tenderness.  LUNGS: Normal breath sounds bilaterally, no wheezing, rales,rhonchi or crepitation. No use of accessory muscles of respiration.  CARDIOVASCULAR: S1, S2 normal. No murmurs, rubs, or gallops.  ABDOMEN: Soft, nontender, nondistended. Bowel sounds present. No organomegaly or mass.  EXTREMITIES: No pedal edema, cyanosis, or clubbing.  NEUROLOGIC: Cranial nerves II through XII are intact. Muscle strength 5/5 in all extremities except left leg. Sensation intact. Gait not checked.  PSYCHIATRIC: The patient is alert and oriented x 3.  SKIN: No obvious rash, lesion, or ulcer.   LABORATORY PANEL:   CBC Recent Labs  Lab 11/30/17 1251  WBC 12.6*  HGB 9.6*  HCT 31.0*  PLT 376    ------------------------------------------------------------------------------------------------------------------  Chemistries  Recent Labs  Lab 11/30/17 1251  NA 142  K 3.7  CL 110  CO2 19*  GLUCOSE 124*  BUN 30*  CREATININE 3.70*  CALCIUM 8.9  AST 32  ALT 18  ALKPHOS 62  BILITOT 0.7   ------------------------------------------------------------------------------------------------------------------  Cardiac Enzymes No results for input(s): TROPONINI in the last 168 hours. ------------------------------------------------------------------------------------------------------------------  RADIOLOGY:  Ct Head Wo Contrast  Result Date: 11/30/2017 CLINICAL DATA:  Minor head trauma EXAM: CT HEAD WITHOUT CONTRAST CT CERVICAL SPINE WITHOUT CONTRAST TECHNIQUE: Multidetector CT imaging of the head and cervical spine was performed following the standard protocol without intravenous contrast. Multiplanar CT image reconstructions of the cervical spine were also generated. COMPARISON:  11/12/2009 head CT FINDINGS: CT HEAD FINDINGS Brain: No evidence of acute infarction, hemorrhage, hydrocephalus, extra-axial collection or mass lesion/mass effect. Moderate low-density in the cerebral white matter attributed to chronic small vessel ischemia. Cerebral volume  loss that is generalized Vascular: Atherosclerotic calcification Skull: Negative for fracture. Left mastoidectomy with chronically opacified bowl. The middle ear is also opacified today, progressed from 2011. Orbits: No evidence of injury CT CERVICAL SPINE FINDINGS Alignment: No traumatic malalignment. Skull base and vertebrae: Negative for fracture Soft tissues and spinal canal: No prevertebral fluid or swelling. No visible canal hematoma. Disc levels: Diffuse degenerative disc narrowing and ridging with foraminal stenosis. Upper chest: Emphysema IMPRESSION: 1. No evidence of acute intracranial or cervical spine injury. 2. Chronic findings are  described above. Electronically Signed   By: Monte Fantasia M.D.   On: 11/30/2017 13:48   Ct Cervical Spine Wo Contrast  Result Date: 11/30/2017 CLINICAL DATA:  Minor head trauma EXAM: CT HEAD WITHOUT CONTRAST CT CERVICAL SPINE WITHOUT CONTRAST TECHNIQUE: Multidetector CT imaging of the head and cervical spine was performed following the standard protocol without intravenous contrast. Multiplanar CT image reconstructions of the cervical spine were also generated. COMPARISON:  11/12/2009 head CT FINDINGS: CT HEAD FINDINGS Brain: No evidence of acute infarction, hemorrhage, hydrocephalus, extra-axial collection or mass lesion/mass effect. Moderate low-density in the cerebral white matter attributed to chronic small vessel ischemia. Cerebral volume loss that is generalized Vascular: Atherosclerotic calcification Skull: Negative for fracture. Left mastoidectomy with chronically opacified bowl. The middle ear is also opacified today, progressed from 2011. Orbits: No evidence of injury CT CERVICAL SPINE FINDINGS Alignment: No traumatic malalignment. Skull base and vertebrae: Negative for fracture Soft tissues and spinal canal: No prevertebral fluid or swelling. No visible canal hematoma. Disc levels: Diffuse degenerative disc narrowing and ridging with foraminal stenosis. Upper chest: Emphysema IMPRESSION: 1. No evidence of acute intracranial or cervical spine injury. 2. Chronic findings are described above. Electronically Signed   By: Monte Fantasia M.D.   On: 11/30/2017 13:48   Ct Abdomen Pelvis W Contrast  Result Date: 11/30/2017 CLINICAL DATA:  Pain after fall. EXAM: CT ABDOMEN AND PELVIS WITH CONTRAST TECHNIQUE: Multidetector CT imaging of the abdomen and pelvis was performed using the standard protocol following bolus administration of intravenous contrast. CONTRAST:  7mL OMNIPAQUE IOHEXOL 300 MG/ML  SOLN COMPARISON:  November 21, 2016 FINDINGS: Lower chest: No acute abnormality. Hepatobiliary: No focal  liver abnormality is seen. No gallstones, gallbladder wall thickening, or biliary dilatation. Pancreas: Fatty deposition in the pancreatic head, neck, and uncinate process. No acute pancreatic abnormality noted. Spleen: Normal in size without focal abnormality. Adrenals/Urinary Tract: Adrenal glands are normal. Multiple renal masses identified in the left kidney, consistent with cysts. No suspicious masses. No stones or hydronephrosis. No perinephric stranding. The ureters and bladder are normal. Stomach/Bowel: The stomach is normal. A duodenal diverticulum is much smaller in the interval with no evidence of active inflammation. Small bowel otherwise normal. The colon and appendix are normal. Vascular/Lymphatic: Atherosclerotic changes are seen in the nonaneurysmal aorta. No adenopathy. Reproductive: Prostate is unremarkable. Other: No abdominal wall hernia or abnormality. No abdominopelvic ascites. Musculoskeletal: There is a comminuted fracture of the proximal left femur, extending through the intertrochanteric region with angulation. No dislocation. No other fractures. IMPRESSION: 1. Comminuted fracture through the intertrochanteric region of the left femur. No dislocation. 2. Renal cysts. 3. Atherosclerotic changes in the abdominal aorta. 4. No other acute abnormalities. Electronically Signed   By: Dorise Bullion III M.D   On: 11/30/2017 13:58   Dg Chest Portable 1 View  Result Date: 11/30/2017 CLINICAL DATA:  Pain after fall EXAM: PORTABLE CHEST 1 VIEW COMPARISON:  November 19, 2017 FINDINGS: The heart size and mediastinal  contours are within normal limits. Both lungs are clear. The visualized skeletal structures are unremarkable. IMPRESSION: No active disease. Electronically Signed   By: Dorise Bullion III M.D   On: 11/30/2017 14:01   Dg Hip Unilat W Or Wo Pelvis 2-3 Views Left  Result Date: 11/30/2017 CLINICAL DATA:  Pain after fall EXAM: DG HIP (WITH OR WITHOUT PELVIS) 2-3V LEFT COMPARISON:   None. FINDINGS: There is a comminuted fracture through the left femoral neck, extending through the intertrochanteric region with angulation. No dislocation. 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 No other fractures identified. IMPRESSION: Left proximal femoral fracture as above. Electronically Signed   By: Dorise Bullion III M.D   On: 11/30/2017 14:00      IMPRESSION AND PLAN:   Left hip fracture. The patient will be admitted to medical floor. Pain control, orthopedic surgery consult for surgery. DVT prophylaxis after surgery.  PT and OT evaluation after surgery. The patient has moderate risk for hip fracture.  Acute renal failure on CKD stage III. Hold lisinopril, start IV fluid and follow-up BMP.  Leukocytosis.  Possible due to reaction.  Check urine analysis and urine drug screen.  Hypertension.  Hold hypertension medication due to low side blood pressure. Anemia of chronic disease, stable. Alcohol use.  Start CIWA protocol. Tobacco abuse.  Smoking cessation was counseled for 3 to 4 minutes, nicotine patch.  All the records are reviewed and case discussed with ED provider. Management plans discussed with the patient, his niece and they are in agreement.  CODE STATUS: Full code  TOTAL TIME TAKING CARE OF THIS PATIENT: 42 minutes.    Demetrios Loll M.D on 11/30/2017 at 2:41 PM  Between 7am to 6pm - Pager - [phone]  After 6pm go to [email or web address] - [employer] [facility]  Office  [phone]  CC: Primary care physician; Leone Haven, MD   Note: This dictation was prepared with Dragon dictation along with smaller phrase technology. Any transcriptional errors that result from this process are unin

## 2017-11-30 NOTE — Progress Notes (Signed)
Advanced Care Plan.  Purpose of Encounter: CODE STATUS. Parties in Attendance: The patient, his niece and me. Patient's Decisional Capacity: Not sure. Medical Story: Justin Andrews  is a 83 y.o. male with a known history of CKD, melanoma, hypertension, osteoarthritis and tobacco abuse.  He is being admitted for left hip fracture and renal failure.  I discussed with the patient about his current condition, prognosis and CODE STATUS.  He stated that he does not want to be resuscitated or intubated if he has cardiopulmonary arrest.  But since he is drunk, I am not sure he has capacity to make decision now.  I talked with his niece, who is also not sure the CODE STATUS.  She will call his son to make sure. Plan:  Code Status: Full code for now. Time spent discussing advance care planning: 18 minutes.

## 2017-11-30 NOTE — ED Notes (Signed)
Admitting at bedside 

## 2017-11-30 NOTE — ED Triage Notes (Signed)
Pt arrived via ems from home from fall from 6 steps and fell on his left side at 4 am. Noted leg shortening, no LOC. Pt was consuming at alcohol. Pt c/o pain. Pt NAD, respirations even and non labored.

## 2017-12-01 ENCOUNTER — Inpatient Hospital Stay: Payer: Medicare HMO | Admitting: Anesthesiology

## 2017-12-01 ENCOUNTER — Inpatient Hospital Stay: Payer: Medicare HMO

## 2017-12-01 ENCOUNTER — Encounter
Admission: EM | Disposition: A | Discharge status: Skilled Nursing Facility | Payer: Self-pay | Source: Home / Self Care | Attending: Internal Medicine

## 2017-12-01 HISTORY — PX: INTRAMEDULLARY (IM) NAIL INTERTROCHANTERIC: SHX5875

## 2017-12-01 LAB — URINE DRUG SCREEN, QUALITATIVE (ARMC ONLY)
Amphetamines, Ur Screen: NOT DETECTED
BENZODIAZEPINE, UR SCRN: NOT DETECTED
Barbiturates, Ur Screen: NOT DETECTED
Cannabinoid 50 Ng, Ur ~~LOC~~: NOT DETECTED
Cocaine Metabolite,Ur ~~LOC~~: NOT DETECTED
MDMA (Ecstasy)Ur Screen: NOT DETECTED
METHADONE SCREEN, URINE: NOT DETECTED
OPIATE, UR SCREEN: POSITIVE — AB
Phencyclidine (PCP) Ur S: NOT DETECTED
Tricyclic, Ur Screen: NOT DETECTED

## 2017-12-01 LAB — BASIC METABOLIC PANEL
ANION GAP: 12 (ref 5–15)
Anion gap: 9 (ref 5–15)
BUN: 28 mg/dL — ABNORMAL HIGH (ref 8–23)
BUN: 22 mg/dL (ref 8–23)
CALCIUM: 8 mg/dL — AB (ref 8.9–10.3)
CHLORIDE: 115 mmol/L — AB (ref 98–111)
CO2: 19 mmol/L — AB (ref 22–32)
CO2: 16 mmol/L — AB (ref 22–32)
CREATININE: 3.29 mg/dL — AB (ref 0.61–1.24)
Calcium: 8.3 mg/dL — ABNORMAL LOW (ref 8.9–10.3)
Chloride: 116 mmol/L — ABNORMAL HIGH (ref 98–111)
Creatinine, Ser: 2.61 mg/dL — ABNORMAL HIGH (ref 0.61–1.24)
GFR calc non Af Amer: 16 mL/min — ABNORMAL LOW (ref >60)
GFR, EST AFRICAN AMERICAN: 19 mL/min — AB (ref >60)
GFR, EST AFRICAN AMERICAN: 25 mL/min — AB (ref >60)
GFR, EST NON AFRICAN AMERICAN: 21 mL/min — AB (ref >60)
Glucose, Bld: 120 mg/dL — ABNORMAL HIGH (ref 70–99)
Glucose, Bld: 155 mg/dL — ABNORMAL HIGH (ref 70–99)
POTASSIUM: 4 mmol/L (ref 3.5–5.1)
Potassium: 3.1 mmol/L — ABNORMAL LOW (ref 3.5–5.1)
SODIUM: 143 mmol/L (ref 135–145)
Sodium: 144 mmol/L (ref 135–145)

## 2017-12-01 LAB — CBC
HEMATOCRIT: 28.3 % — AB (ref 39.0–52.0)
HEMOGLOBIN: 9 g/dL — AB (ref 13.0–17.0)
MCH: 32 pg (ref 26.0–34.0)
MCHC: 31.8 g/dL (ref 30.0–36.0)
MCV: 100.7 fL — AB (ref 80.0–100.0)
NRBC: 0 % (ref 0.0–0.2)
Platelets: 356 10*3/uL (ref 150–400)
RBC: 2.81 MIL/uL — ABNORMAL LOW (ref 4.22–5.81)
RDW: 15.8 % — AB (ref 11.5–15.5)
WBC: 11.6 10*3/uL — AB (ref 4.0–10.5)

## 2017-12-01 SURGERY — FIXATION, FRACTURE, INTERTROCHANTERIC, WITH INTRAMEDULLARY ROD
Anesthesia: Choice | Laterality: Left

## 2017-12-01 MED ORDER — GABAPENTIN 300 MG PO CAPS
300.0000 mg | ORAL_CAPSULE | Freq: Three times a day (TID) | ORAL | Status: DC
  Administered 2017-12-02: 300 mg via ORAL
  Filled 2017-12-01: qty 1

## 2017-12-01 MED ORDER — DEXTROSE 5 % IV SOLN
INTRAVENOUS | Status: DC | PRN
  Administered 2017-12-01: 17:00:00 via INTRAVENOUS

## 2017-12-01 MED ORDER — ALUM & MAG HYDROXIDE-SIMETH 200-200-20 MG/5ML PO SUSP: 30.0000 mL | ORAL | Status: DC | PRN

## 2017-12-01 MED ORDER — ENOXAPARIN SODIUM 40 MG/0.4ML ~~LOC~~ SOLN: 40.0000 mg | SUBCUTANEOUS | Status: DC

## 2017-12-01 MED ORDER — POTASSIUM CHLORIDE IN NACL 20-0.9 MEQ/L-% IV SOLN
INTRAVENOUS | Status: DC
  Administered 2017-12-01: 22:00:00 via INTRAVENOUS
  Filled 2017-12-01 (×3): qty 1000

## 2017-12-01 MED ORDER — LIDOCAINE HCL (CARDIAC) PF 100 MG/5ML IV SOSY
PREFILLED_SYRINGE | INTRAVENOUS | Status: DC | PRN
  Administered 2017-12-01: 60 mg via INTRAVENOUS

## 2017-12-01 MED ORDER — MORPHINE SULFATE (PF) 2 MG/ML IV SOLN
2.0000 mg | Freq: Four times a day (QID) | INTRAVENOUS | Status: DC | PRN
  Administered 2017-12-01: 2 mg via INTRAVENOUS
  Filled 2017-12-01: qty 1

## 2017-12-01 MED ORDER — SUCCINYLCHOLINE CHLORIDE 20 MG/ML IJ SOLN
INTRAMUSCULAR | Status: DC | PRN
  Administered 2017-12-01: 100 mg via INTRAVENOUS

## 2017-12-01 MED ORDER — OXYCODONE HCL 5 MG PO TABS: 10.0000 mg | ORAL_TABLET | ORAL | Status: DC | PRN

## 2017-12-01 MED ORDER — SODIUM BICARBONATE 8.4 % IV SOLN
INTRAVENOUS | Status: DC
  Administered 2017-12-01: 09:00:00 via INTRAVENOUS
  Filled 2017-12-01: qty 100

## 2017-12-01 MED ORDER — FAMOTIDINE 20 MG PO TABS
20.0000 mg | ORAL_TABLET | Freq: Every day | ORAL | Status: DC
  Administered 2017-12-02 – 2017-12-03 (×2): 20 mg via ORAL
  Filled 2017-12-01 (×2): qty 1

## 2017-12-01 MED ORDER — DEXAMETHASONE SODIUM PHOSPHATE 10 MG/ML IJ SOLN
INTRAMUSCULAR | Status: AC
  Filled 2017-12-01: qty 1

## 2017-12-01 MED ORDER — BISACODYL 10 MG RE SUPP: 10.0000 mg | Freq: Every day | RECTAL | Status: DC | PRN

## 2017-12-01 MED ORDER — HYDROCODONE-ACETAMINOPHEN 5-325 MG PO TABS: 1.0000 | ORAL_TABLET | ORAL | Status: DC | PRN

## 2017-12-01 MED ORDER — POTASSIUM CHLORIDE 10 MEQ/100ML IV SOLN
10.0000 meq | INTRAVENOUS | Status: AC
  Administered 2017-12-01 (×4): 10 meq via INTRAVENOUS
  Filled 2017-12-01 (×3): qty 100

## 2017-12-01 MED ORDER — PROPOFOL 10 MG/ML IV BOLUS
INTRAVENOUS | Status: DC | PRN
  Administered 2017-12-01: 80 mg via INTRAVENOUS

## 2017-12-01 MED ORDER — ACETAMINOPHEN 325 MG PO TABS: 325.0000 mg | ORAL_TABLET | Freq: Four times a day (QID) | ORAL | Status: DC | PRN

## 2017-12-01 MED ORDER — ONDANSETRON HCL 4 MG/2ML IJ SOLN: 4.0000 mg | Freq: Once | INTRAMUSCULAR | Status: DC | PRN

## 2017-12-01 MED ORDER — SODIUM CHLORIDE 0.9 % IV SOLN
INTRAVENOUS | Status: DC | PRN
  Administered 2017-12-01: 18:00:00 via INTRAVENOUS

## 2017-12-01 MED ORDER — ACETAMINOPHEN 500 MG PO TABS
1000.0000 mg | ORAL_TABLET | Freq: Four times a day (QID) | ORAL | Status: AC
  Administered 2017-12-02 (×4): 1000 mg via ORAL
  Filled 2017-12-01 (×4): qty 2

## 2017-12-01 MED ORDER — ONDANSETRON HCL 4 MG PO TABS: 4.0000 mg | ORAL_TABLET | Freq: Four times a day (QID) | ORAL | Status: DC | PRN

## 2017-12-01 MED ORDER — SODIUM CHLORIDE 0.9 % IR SOLN
Status: DC | PRN
  Administered 2017-12-01: 1000 mL

## 2017-12-01 MED ORDER — MAGNESIUM CITRATE PO SOLN
1.0000 | Freq: Once | ORAL | Status: DC | PRN
  Filled 2017-12-01: qty 296

## 2017-12-01 MED ORDER — MENTHOL 3 MG MT LOZG
1.0000 | LOZENGE | OROMUCOSAL | Status: DC | PRN
  Filled 2017-12-01: qty 9

## 2017-12-01 MED ORDER — TRAMADOL HCL 50 MG PO TABS
50.0000 mg | ORAL_TABLET | Freq: Four times a day (QID) | ORAL | Status: DC
  Administered 2017-12-02 (×2): 50 mg via ORAL
  Filled 2017-12-01 (×2): qty 1

## 2017-12-01 MED ORDER — OXYCODONE HCL 5 MG PO TABS: 5.0000 mg | ORAL_TABLET | ORAL | Status: DC | PRN

## 2017-12-01 MED ORDER — ROCURONIUM BROMIDE 50 MG/5ML IV SOLN
INTRAVENOUS | Status: AC
  Filled 2017-12-01: qty 1

## 2017-12-01 MED ORDER — FENTANYL CITRATE (PF) 100 MCG/2ML IJ SOLN
25.0000 ug | INTRAMUSCULAR | Status: DC | PRN
  Administered 2017-12-01 (×4): 25 ug via INTRAVENOUS

## 2017-12-01 MED ORDER — FENTANYL CITRATE (PF) 100 MCG/2ML IJ SOLN
INTRAMUSCULAR | Status: AC
  Filled 2017-12-01: qty 2

## 2017-12-01 MED ORDER — HYDROMORPHONE HCL 1 MG/ML IJ SOLN: 0.5000 mg | INTRAMUSCULAR | Status: DC | PRN

## 2017-12-01 MED ORDER — PHENOL 1.4 % MT LIQD
1.0000 | OROMUCOSAL | Status: DC | PRN
  Filled 2017-12-01: qty 177

## 2017-12-01 MED ORDER — ONDANSETRON HCL 4 MG/2ML IJ SOLN
INTRAMUSCULAR | Status: DC | PRN
  Administered 2017-12-01: 4 mg via INTRAVENOUS

## 2017-12-01 MED ORDER — POLYETHYLENE GLYCOL 3350 17 G PO PACK
17.0000 g | PACK | Freq: Every day | ORAL | Status: DC | PRN
  Administered 2017-12-02: 17 g via ORAL
  Filled 2017-12-01: qty 1

## 2017-12-01 MED ORDER — FENTANYL CITRATE (PF) 100 MCG/2ML IJ SOLN
INTRAMUSCULAR | Status: DC | PRN
  Administered 2017-12-01: 50 ug via INTRAVENOUS

## 2017-12-01 MED ORDER — SODIUM CHLORIDE 0.9 % IV SOLN
INTRAVENOUS | Status: DC | PRN
  Administered 2017-12-01: 30 ug/min via INTRAVENOUS

## 2017-12-01 MED ORDER — ONDANSETRON HCL 4 MG/2ML IJ SOLN
INTRAMUSCULAR | Status: AC
  Filled 2017-12-01: qty 2

## 2017-12-01 MED ORDER — ONDANSETRON HCL 4 MG/2ML IJ SOLN: 4.0000 mg | Freq: Four times a day (QID) | INTRAMUSCULAR | Status: DC | PRN

## 2017-12-01 MED ORDER — PHENYLEPHRINE HCL 10 MG/ML IJ SOLN
INTRAMUSCULAR | Status: DC | PRN
  Administered 2017-12-01 (×3): 100 ug via INTRAVENOUS

## 2017-12-01 MED ORDER — ROCURONIUM BROMIDE 100 MG/10ML IV SOLN
INTRAVENOUS | Status: DC | PRN
  Administered 2017-12-01: 20 mg via INTRAVENOUS
  Administered 2017-12-01: 10 mg via INTRAVENOUS

## 2017-12-01 MED ORDER — CEFAZOLIN SODIUM-DEXTROSE 1-4 GM/50ML-% IV SOLN
1.0000 g | Freq: Four times a day (QID) | INTRAVENOUS | Status: AC
  Administered 2017-12-02 (×2): 1 g via INTRAVENOUS
  Filled 2017-12-01 (×2): qty 50

## 2017-12-01 MED ORDER — DEXAMETHASONE SODIUM PHOSPHATE 10 MG/ML IJ SOLN
INTRAMUSCULAR | Status: DC | PRN
  Administered 2017-12-01: 5 mg via INTRAVENOUS

## 2017-12-01 MED ORDER — FENTANYL CITRATE (PF) 100 MCG/2ML IJ SOLN
INTRAMUSCULAR | Status: AC
  Administered 2017-12-01: 25 ug via INTRAVENOUS
  Filled 2017-12-01: qty 2

## 2017-12-01 MED ORDER — PROPOFOL 10 MG/ML IV BOLUS
INTRAVENOUS | Status: AC
  Filled 2017-12-01: qty 20

## 2017-12-01 MED ORDER — DOCUSATE SODIUM 100 MG PO CAPS
100.0000 mg | ORAL_CAPSULE | Freq: Two times a day (BID) | ORAL | Status: DC
  Administered 2017-12-02 – 2017-12-03 (×2): 100 mg via ORAL
  Filled 2017-12-01 (×2): qty 1

## 2017-12-01 SURGICAL SUPPLY — 40 items
BIT DRILL 4.3MMS DISTAL GRDTED (BIT) ×1 IMPLANT
BNDG COHESIVE 6X5 TAN STRL LF (GAUZE/BANDAGES/DRESSINGS) ×4 IMPLANT
CANISTER SUCT 1200ML W/VALVE (MISCELLANEOUS) ×2 IMPLANT
CORTICAL BONE SCR 5.0MM X 48MM (Screw) ×2 IMPLANT
COVER WAND RF STERILE (DRAPES) ×2 IMPLANT
DRAPE SHEET LG 3/4 BI-LAMINATE (DRAPES) ×4 IMPLANT
DRAPE SURG 17X11 SM STRL (DRAPES) ×4 IMPLANT
DRAPE U-SHAPE 47X51 STRL (DRAPES) ×2 IMPLANT
DRILL 4.3MMS DISTAL GRADUATED (BIT) ×2
DRSG OPSITE POSTOP 4X14 (GAUZE/BANDAGES/DRESSINGS) ×2 IMPLANT
DURAPREP 26ML APPLICATOR (WOUND CARE) ×4 IMPLANT
ELECT REM PT RETURN 9FT ADLT (ELECTROSURGICAL) ×2
ELECTRODE REM PT RTRN 9FT ADLT (ELECTROSURGICAL) ×1 IMPLANT
GLOVE BIOGEL PI IND STRL 9 (GLOVE) ×1 IMPLANT
GLOVE BIOGEL PI INDICATOR 9 (GLOVE) ×1
GLOVE SURG 9.0 ORTHO LTXF (GLOVE) ×4 IMPLANT
GOWN STRL REUS TWL 2XL XL LVL4 (GOWN DISPOSABLE) ×2 IMPLANT
GOWN STRL REUS W/ TWL LRG LVL3 (GOWN DISPOSABLE) ×1 IMPLANT
GOWN STRL REUS W/TWL LRG LVL3 (GOWN DISPOSABLE) ×2
GUIDEPIN 3.2X17.5 THRD DISP (PIN) ×2 IMPLANT
GUIDEWIRE BALL NOSE 80CM (WIRE) ×2 IMPLANT
HEMOVAC 400CC 10FR (MISCELLANEOUS) ×2 IMPLANT
HIP FR NAIL LAG SCREW 10.5X110 (Orthopedic Implant) ×2 IMPLANT
KIT TURNOVER CYSTO (KITS) ×2 IMPLANT
MAT ABSORB  FLUID 56X50 GRAY (MISCELLANEOUS) ×1
MAT ABSORB FLUID 56X50 GRAY (MISCELLANEOUS) ×1 IMPLANT
NAIL HIP FRACT LT 130D 11X400 (Nail) ×2 IMPLANT
NS IRRIG 1000ML POUR BTL (IV SOLUTION) ×2 IMPLANT
PACK HIP COMPR (MISCELLANEOUS) ×2 IMPLANT
SCREW BONE CORTICAL 5.0X50 (Screw) ×2 IMPLANT
SCREW CORTICL BON 5.0MM X 48MM (Screw) ×1 IMPLANT
SCREW LAG HIP FR NAIL 10.5X110 (Orthopedic Implant) ×1 IMPLANT
STAPLER SKIN PROX 35W (STAPLE) ×2 IMPLANT
SUCTION FRAZIER HANDLE 10FR (MISCELLANEOUS) ×1
SUCTION TUBE FRAZIER 10FR DISP (MISCELLANEOUS) ×1 IMPLANT
SUT VIC AB 0 CT1 36 (SUTURE) ×4 IMPLANT
SUT VIC AB 2-0 CT1 27 (SUTURE) ×2
SUT VIC AB 2-0 CT1 TAPERPNT 27 (SUTURE) ×1 IMPLANT
SUT VICRYL 0 AB UR-6 (SUTURE) ×2 IMPLANT
SYR 30ML LL (SYRINGE) ×2 IMPLANT

## 2017-12-01 NOTE — Progress Notes (Signed)
Subjective:  Patient is confused.  He complains of left hip pain.  The patient is unable to provide additional history.  Patient's son is present.  He explains that his father has a history of alcohol abuse and abuse of pain medication.  Patient is on precaution for C. difficile which he tested positive for upon admission.  Objective:   VITALS:   Vitals:   11/30/17 2227 12/01/17 0601 12/01/17 0746 12/01/17 1218  BP: (!) 115/52 135/69 140/63 (!) 143/89  Pulse: 92 93 86 98  Resp: 20 20  18   Temp: 98 F (36.7 C)  98 F (36.7 C) 98 F (36.7 C)  TempSrc: Oral  Axillary Oral  SpO2: 98% 96% 95% 96%  Weight:      Height:        PHYSICAL EXAM: Left lower extremity: Patient has shortening and external rotation of the left lower extremity.  Skin is intact.  His compartments are soft and compressible. Neurovascular intact Sensation intact distally Intact pulses distally Dorsiflexion/Plantar flexion intact No cellulitis present Compartment soft  LABS  Results for orders placed or performed during the hospital encounter of 11/30/17 (from the past 24 hour(s))  Protime-INR     Status: None   Collection Time: 11/30/17  3:08 PM  Result Value Ref Range   Prothrombin Time 14.5 11.4 - 15.2 seconds   INR 1.14   Type and screen Ordered by PROVIDER DEFAULT     Status: None   Collection Time: 11/30/17  3:08 PM  Result Value Ref Range   ABO/RH(D) A POS    Antibody Screen NEG    Sample Expiration      12/03/2017 Performed at [facility], [address] [place], [place] [address]   Surgical PCR screen     Status: Abnormal   Collection Time: 11/30/17  4:43 PM  Result Value Ref Range   MRSA, PCR NEGATIVE NEGATIVE   Staphylococcus aureus POSITIVE (A) NEGATIVE  APTT     Status: None   Collection Time: 11/30/17  7:43 PM  Result Value Ref Range   aPTT 29 24 - 36 seconds  Albumin     Status: Abnormal   Collection Time: 11/30/17  7:43 PM  Result Value Ref Range   Albumin 3.2 (L)  3.5 - 5.0 g/dL  Urinalysis, Complete w Microscopic     Status: Abnormal   Collection Time: 11/30/17  8:17 PM  Result Value Ref Range   Color, Urine AMBER (A) YELLOW   APPearance CLEAR (A) CLEAR   Specific Gravity, Urine 1.030 1.005 - 1.030   pH 5.0 5.0 - 8.0   Glucose, UA NEGATIVE NEGATIVE mg/dL   Hgb urine dipstick SMALL (A) NEGATIVE   Bilirubin Urine NEGATIVE NEGATIVE   Ketones, ur NEGATIVE NEGATIVE mg/dL   Protein, ur 30 (A) NEGATIVE mg/dL   Nitrite NEGATIVE NEGATIVE   Leukocytes, UA NEGATIVE NEGATIVE   RBC / HPF 0-5 0 - 5 RBC/hpf   WBC, UA 0-5 0 - 5 WBC/hpf   Bacteria, UA NONE SEEN NONE SEEN   Squamous Epithelial / LPF 0-5 0 - 5   Mucus PRESENT    Hyaline Casts, UA PRESENT   Urine Drug Screen, Qualitative ([facility] only)     Status: Abnormal   Collection Time: 11/30/17  8:17 PM  Result Value Ref Range   Tricyclic, Ur Screen NONE DETECTED NONE DETECTED   Amphetamines, Ur Screen RESULTS UNAVAILABLE DUE TO INTERFERING SUBSTANCE (A) NONE DETECTED   MDMA (Ecstasy)Ur Screen NONE DETECTED NONE  DETECTED   Cocaine Metabolite,Ur [place] RESULTS UNAVAILABLE DUE TO INTERFERING SUBSTANCE (A) NONE DETECTED   Opiate, Ur Screen RESULTS UNAVAILABLE DUE TO INTERFERING SUBSTANCE (A) NONE DETECTED   Phencyclidine (PCP) Ur S RESULTS UNAVAILABLE DUE TO INTERFERING SUBSTANCE (A) NONE DETECTED   Cannabinoid 50 Ng, Ur [place] RESULTS UNAVAILABLE DUE TO INTERFERING SUBSTANCE (A) NONE DETECTED   Barbiturates, Ur Screen NONE DETECTED NONE DETECTED   Benzodiazepine, Ur Scrn RESULTS UNAVAILABLE DUE TO INTERFERING SUBSTANCE (A) NONE DETECTED   Methadone Scn, Ur NONE DETECTED NONE DETECTED  Basic metabolic panel     Status: Abnormal   Collection Time: 12/01/17  3:47 AM  Result Value Ref Range   Sodium 143 135 - 145 mmol/L   Potassium 3.1 (L) 3.5 - 5.1 mmol/L   Chloride 115 (H) 98 - 111 mmol/L   CO2 19 (L) 22 - 32 mmol/L   Glucose, Bld 120 (H) 70 - 99 mg/dL   BUN 28 (H) 8 - 23 mg/dL   Creatinine, Ser 3.29 (H) 0.61 -  1.24 mg/dL   Calcium 8.3 (L) 8.9 - 10.3 mg/dL   GFR calc non Af Amer 16 (L) >60 mL/min   GFR calc Af Amer 19 (L) >60 mL/min   Anion gap 9 5 - 15  CBC     Status: Abnormal   Collection Time: 12/01/17  3:47 AM  Result Value Ref Range   WBC 11.6 (H) 4.0 - 10.5 K/uL   RBC 2.81 (L) 4.22 - 5.81 MIL/uL   Hemoglobin 9.0 (L) 13.0 - 17.0 g/dL   HCT 28.3 (L) 39.0 - 52.0 %   MCV 100.7 (H) 80.0 - 100.0 fL   MCH 32.0 26.0 - 34.0 pg   MCHC 31.8 30.0 - 36.0 g/dL   RDW 15.8 (H) 11.5 - 15.5 %   Platelets 356 150 - 400 K/uL   nRBC 0.0 0.0 - 0.2 %  Urine Drug Screen, Qualitative ([facility] only)     Status: Abnormal   Collection Time: 12/01/17 12:11 PM  Result Value Ref Range   Tricyclic, Ur Screen NONE DETECTED NONE DETECTED   Amphetamines, Ur Screen NONE DETECTED NONE DETECTED   MDMA (Ecstasy)Ur Screen NONE DETECTED NONE DETECTED   Cocaine Metabolite,Ur [place] NONE DETECTED NONE DETECTED   Opiate, Ur Screen POSITIVE (A) NONE DETECTED   Phencyclidine (PCP) Ur S NONE DETECTED NONE DETECTED   Cannabinoid 50 Ng, Ur  NONE DETECTED NONE DETECTED   Barbiturates, Ur Screen NONE DETECTED NONE DETECTED   Benzodiazepine, Ur Scrn NONE DETECTED NONE DETECTED   Methadone Scn, Ur NONE DETECTED NONE DETECTED    Ct Head Wo Contrast  Result Date: 11/30/2017 CLINICAL DATA:  Minor head trauma EXAM: CT HEAD WITHOUT CONTRAST CT CERVICAL SPINE WITHOUT CONTRAST TECHNIQUE: Multidetector CT imaging of the head and cervical spine was performed following the standard protocol without intravenous contrast. Multiplanar CT image reconstructions of the cervical spine were also generated. COMPARISON:  11/12/2009 head CT FINDINGS: CT HEAD FINDINGS Brain: No evidence of acute infarction, hemorrhage, hydrocephalus, extra-axial collection or mass lesion/mass effect. Moderate low-density in the cerebral white matter attributed to chronic small vessel ischemia. Cerebral volume loss that is generalized Vascular: Atherosclerotic calcification  Skull: Negative for fracture. Left mastoidectomy with chronically opacified bowl. The middle ear is also opacified today, progressed from 2011. Orbits: No evidence of injury CT CERVICAL SPINE FINDINGS Alignment: No traumatic malalignment. Skull base and vertebrae: Negative for fracture Soft tissues and spinal canal: No prevertebral fluid or swelling. No visible canal hematoma.  Disc levels: Diffuse degenerative disc narrowing and ridging with foraminal stenosis. Upper chest: Emphysema IMPRESSION: 1. No evidence of acute intracranial or cervical spine injury. 2. Chronic findings are described above. Electronically Signed   By: Monte Fantasia M.D.   On: 11/30/2017 13:48   Ct Cervical Spine Wo Contrast  Result Date: 11/30/2017 CLINICAL DATA:  Minor head trauma EXAM: CT HEAD WITHOUT CONTRAST CT CERVICAL SPINE WITHOUT CONTRAST TECHNIQUE: Multidetector CT imaging of the head and cervical spine was performed following the standard protocol without intravenous contrast. Multiplanar CT image reconstructions of the cervical spine were also generated. COMPARISON:  11/12/2009 head CT FINDINGS: CT HEAD FINDINGS Brain: No evidence of acute infarction, hemorrhage, hydrocephalus, extra-axial collection or mass lesion/mass effect. Moderate low-density in the cerebral white matter attributed to chronic small vessel ischemia. Cerebral volume loss that is generalized Vascular: Atherosclerotic calcification Skull: Negative for fracture. Left mastoidectomy with chronically opacified bowl. The middle ear is also opacified today, progressed from 2011. Orbits: No evidence of injury CT CERVICAL SPINE FINDINGS Alignment: No traumatic malalignment. Skull base and vertebrae: Negative for fracture Soft tissues and spinal canal: No prevertebral fluid or swelling. No visible canal hematoma. Disc levels: Diffuse degenerative disc narrowing and ridging with foraminal stenosis. Upper chest: Emphysema IMPRESSION: 1. No evidence of acute  intracranial or cervical spine injury. 2. Chronic findings are described above. Electronically Signed   By: Monte Fantasia M.D.   On: 11/30/2017 13:48   Ct Abdomen Pelvis W Contrast  Result Date: 11/30/2017 CLINICAL DATA:  Pain after fall. EXAM: CT ABDOMEN AND PELVIS WITH CONTRAST TECHNIQUE: Multidetector CT imaging of the abdomen and pelvis was performed using the standard protocol following bolus administration of intravenous contrast. CONTRAST:  73mL OMNIPAQUE IOHEXOL 300 MG/ML  SOLN COMPARISON:  November 21, 2016 FINDINGS: Lower chest: No acute abnormality. Hepatobiliary: No focal liver abnormality is seen. No gallstones, gallbladder wall thickening, or biliary dilatation. Pancreas: Fatty deposition in the pancreatic head, neck, and uncinate process. No acute pancreatic abnormality noted. Spleen: Normal in size without focal abnormality. Adrenals/Urinary Tract: Adrenal glands are normal. Multiple renal masses identified in the left kidney, consistent with cysts. No suspicious masses. No stones or hydronephrosis. No perinephric stranding. The ureters and bladder are normal. Stomach/Bowel: The stomach is normal. A duodenal diverticulum is much smaller in the interval with no evidence of active inflammation. Small bowel otherwise normal. The colon and appendix are normal. Vascular/Lymphatic: Atherosclerotic changes are seen in the nonaneurysmal aorta. No adenopathy. Reproductive: Prostate is unremarkable. Other: No abdominal wall hernia or abnormality. No abdominopelvic ascites. Musculoskeletal: There is a comminuted fracture of the proximal left femur, extending through the intertrochanteric region with angulation. No dislocation. No other fractures. IMPRESSION: 1. Comminuted fracture through the intertrochanteric region of the left femur. No dislocation. 2. Renal cysts. 3. Atherosclerotic changes in the abdominal aorta. 4. No other acute abnormalities. Electronically Signed   By: Dorise Bullion III M.D    On: 11/30/2017 13:58   Dg Chest Portable 1 View  Result Date: 11/30/2017 CLINICAL DATA:  Pain after fall EXAM: PORTABLE CHEST 1 VIEW COMPARISON:  November 19, 2017 FINDINGS: The heart size and mediastinal contours are within normal limits. Both lungs are clear. The visualized skeletal structures are unremarkable. IMPRESSION: No active disease. Electronically Signed   By: Dorise Bullion III M.D   On: 11/30/2017 14:01   Dg Hip Unilat W Or Wo Pelvis 2-3 Views Left  Result Date: 11/30/2017 CLINICAL DATA:  Pain after fall EXAM: DG HIP (WITH OR  WITHOUT PELVIS) 2-3V LEFT COMPARISON:  None. FINDINGS: There is a comminuted fracture through the left femoral neck, extending through the intertrochanteric region with angulation. No dislocation. 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 No other fractures identified. IMPRESSION: Left proximal femoral fracture as above. Electronically Signed   By: Dorise Bullion III M.D   On: 11/30/2017 14:00    Assessment/Plan: Day of Surgery   Active Problems:   Closed left hip fracture ([place])  I discussed with the patient's son but details of patient's proposed hip surgery as well as the postop recovery.  Discussed the risks and benefits of surgery.  Patient's son who makes medical decisions for his father, understands that the risks include but are not limited to infection, bleeding requiring blood transfusion, nerve or blood vessel injury, lower extremity shortening or change in rotation, malunion, nonunion, persistent hip pain and the need for further surgery.  He also understands a medical risks include but are not limited to DVT and pulmonary embolism, myocardial infarction, stroke, pneumonia, respiratory failure and death.  He understood these risks and agreed with the plan for surgery.  I answered all of his questions.  Patient is deemed moderate risk for surgery per the hospitalist service.  I reviewed all laboratory studies and radiographic studies in preparation for  this case.    Thornton Park , MD 12/01/2017, 1:10 PM

## 2017-12-01 NOTE — Care Management Note (Addendum)
Case Management Note  Patient Details  Name: Justin Andrews MRN: [id number] Date of Birth: 06-09-1934  Subjective/Objective:    Admitted to [facility] with the diagnosis of closed left hip fracture. Spoke with son [name] at the bedside. Lives alone in a borading house. Son is [name] [phone]). Sees Dr. Biagio Quint as primary, Prescriptions are filled at [facility] on [address].  No home health. No skilled facility. No home oxygen. No medical equipment in the home.  Takes care of all basic activities of daily living himself, doesn't drive. [place] [address] prior to this admission. NPO. CIWA protocol in place.  Will need intramedullary fixation of left hip per Dr. Mack Guise,                Action/Plan: Will continue to follow for discharge plans   Expected Discharge Date:                  Expected Discharge Plan:     In-House Referral:     Discharge planning Services     Post Acute Care Choice:    Choice offered to:     DME Arranged:    DME Agency:     HH Arranged:    [place] Agency:     Status of Service:     If discussed at [place] of Stay Meetings, dates discussed:    Additional Comments:  Shelbie Ammons, RN MSN CCM Care Management [phone] 12/01/2017, 1:11 PM

## 2017-12-01 NOTE — Progress Notes (Signed)
[place] [facility] [place] NAME: Avinash Maltos    MR#:  [id number]  DATE OF BIRTH:  09-Jan-1934  SUBJECTIVE:  patient came in after he had fallen in the concrete driveway and found to have hip fracture. He has history of drinking alcohol currently he is sedated and according to the RN was quite confused during the nighttime. No family in the room.  REVIEW OF SYSTEMS:   Review of Systems  Unable to perform ROS: Mental status change   Tolerating Diet:npo Tolerating PT: peniding hip surgery  DRUG ALLERGIES:   Allergies  Allergen Reactions  . Aspirin Other (See Comments)    bleeding    VITALS:  Blood pressure 135/69, pulse 93, temperature 98 F (36.7 C), temperature source Oral, resp. rate 20, height 5\' 7"  (1.702 m), weight 63 kg, SpO2 96 %.  PHYSICAL EXAMINATION:   Physical Exam  GENERAL:  83 y.o.-year-old patient lying in the bed with no acute distress. Limited exam since patient is sedated  EYES: Pupils equal, round, reactive to light and accommodation. No scleral icterus. Extraocular muscles intact.  HEENT: Head atraumatic, normocephalic. Oropharynx and nasopharynx clear.  NECK:  Supple, no jugular venous distention. No thyroid enlargement, no tenderness.  LUNGS: Normal breath sounds bilaterally, no wheezing, rales, rhonchi. No use of accessory muscles of respiration.  CARDIOVASCULAR: S1, S2 normal. No murmurs, rubs, or gallops.  ABDOMEN: Soft, nontender, nondistended. Bowel sounds present. No organomegaly or mass.  EXTREMITIES: No cyanosis, clubbing or edema b/l.    NEUROLOGIC: unable to perform patient sedated  PSYCHIATRIC:  patient is sedated from the Ativan received earlier SKIN: No obvious rash, lesion, or ulcer.   LABORATORY PANEL:  CBC Recent Labs  Lab 12/01/17 0347  WBC 11.6*  HGB 9.0*  HCT 28.3*  PLT 356    Chemistries  Recent Labs  Lab 11/30/17 1251 11/30/17 1252 12/01/17 0347  NA 142  --  143  K 3.7  --   3.1*  CL 110  --  115*  CO2 19*  --  19*  GLUCOSE 124*  --  120*  BUN 30*  --  28*  CREATININE 3.70*  --  3.29*  CALCIUM 8.9  --  8.3*  MG  --  2.1  --   AST 32  --   --   ALT 18  --   --   ALKPHOS 62  --   --   BILITOT 0.7  --   --    Cardiac Enzymes No results for input(s): TROPONINI in the last 168 hours. RADIOLOGY:  Ct Head Wo Contrast  Result Date: 11/30/2017 CLINICAL DATA:  Minor head trauma EXAM: CT HEAD WITHOUT CONTRAST CT CERVICAL SPINE WITHOUT CONTRAST TECHNIQUE: Multidetector CT imaging of the head and cervical spine was performed following the standard protocol without intravenous contrast. Multiplanar CT image reconstructions of the cervical spine were also generated. COMPARISON:  11/12/2009 head CT FINDINGS: CT HEAD FINDINGS Brain: No evidence of acute infarction, hemorrhage, hydrocephalus, extra-axial collection or mass lesion/mass effect. Moderate low-density in the cerebral white matter attributed to chronic small vessel ischemia. Cerebral volume loss that is generalized Vascular: Atherosclerotic calcification Skull: Negative for fracture. Left mastoidectomy with chronically opacified bowl. The middle ear is also opacified today, progressed from 2011. Orbits: No evidence of injury CT CERVICAL SPINE FINDINGS Alignment: No traumatic malalignment. Skull base and vertebrae: Negative for fracture Soft tissues and spinal canal: No prevertebral fluid or swelling. No visible canal hematoma. Disc  levels: Diffuse degenerative disc narrowing and ridging with foraminal stenosis. Upper chest: Emphysema IMPRESSION: 1. No evidence of acute intracranial or cervical spine injury. 2. Chronic findings are described above. Electronically Signed   By: Monte Fantasia M.D.   On: 11/30/2017 13:48   Ct Cervical Spine Wo Contrast  Result Date: 11/30/2017 CLINICAL DATA:  Minor head trauma EXAM: CT HEAD WITHOUT CONTRAST CT CERVICAL SPINE WITHOUT CONTRAST TECHNIQUE: Multidetector CT imaging of the head  and cervical spine was performed following the standard protocol without intravenous contrast. Multiplanar CT image reconstructions of the cervical spine were also generated. COMPARISON:  11/12/2009 head CT FINDINGS: CT HEAD FINDINGS Brain: No evidence of acute infarction, hemorrhage, hydrocephalus, extra-axial collection or mass lesion/mass effect. Moderate low-density in the cerebral white matter attributed to chronic small vessel ischemia. Cerebral volume loss that is generalized Vascular: Atherosclerotic calcification Skull: Negative for fracture. Left mastoidectomy with chronically opacified bowl. The middle ear is also opacified today, progressed from 2011. Orbits: No evidence of injury CT CERVICAL SPINE FINDINGS Alignment: No traumatic malalignment. Skull base and vertebrae: Negative for fracture Soft tissues and spinal canal: No prevertebral fluid or swelling. No visible canal hematoma. Disc levels: Diffuse degenerative disc narrowing and ridging with foraminal stenosis. Upper chest: Emphysema IMPRESSION: 1. No evidence of acute intracranial or cervical spine injury. 2. Chronic findings are described above. Electronically Signed   By: Monte Fantasia M.D.   On: 11/30/2017 13:48   Ct Abdomen Pelvis W Contrast  Result Date: 11/30/2017 CLINICAL DATA:  Pain after fall. EXAM: CT ABDOMEN AND PELVIS WITH CONTRAST TECHNIQUE: Multidetector CT imaging of the abdomen and pelvis was performed using the standard protocol following bolus administration of intravenous contrast. CONTRAST:  48mL OMNIPAQUE IOHEXOL 300 MG/ML  SOLN COMPARISON:  November 21, 2016 FINDINGS: Lower chest: No acute abnormality. Hepatobiliary: No focal liver abnormality is seen. No gallstones, gallbladder wall thickening, or biliary dilatation. Pancreas: Fatty deposition in the pancreatic head, neck, and uncinate process. No acute pancreatic abnormality noted. Spleen: Normal in size without focal abnormality. Adrenals/Urinary Tract: Adrenal  glands are normal. Multiple renal masses identified in the left kidney, consistent with cysts. No suspicious masses. No stones or hydronephrosis. No perinephric stranding. The ureters and bladder are normal. Stomach/Bowel: The stomach is normal. A duodenal diverticulum is much smaller in the interval with no evidence of active inflammation. Small bowel otherwise normal. The colon and appendix are normal. Vascular/Lymphatic: Atherosclerotic changes are seen in the nonaneurysmal aorta. No adenopathy. Reproductive: Prostate is unremarkable. Other: No abdominal wall hernia or abnormality. No abdominopelvic ascites. Musculoskeletal: There is a comminuted fracture of the proximal left femur, extending through the intertrochanteric region with angulation. No dislocation. No other fractures. IMPRESSION: 1. Comminuted fracture through the intertrochanteric region of the left femur. No dislocation. 2. Renal cysts. 3. Atherosclerotic changes in the abdominal aorta. 4. No other acute abnormalities. Electronically Signed   By: Dorise Bullion III M.D   On: 11/30/2017 13:58   Dg Chest Portable 1 View  Result Date: 11/30/2017 CLINICAL DATA:  Pain after fall EXAM: PORTABLE CHEST 1 VIEW COMPARISON:  November 19, 2017 FINDINGS: The heart size and mediastinal contours are within normal limits. Both lungs are clear. The visualized skeletal structures are unremarkable. IMPRESSION: No active disease. Electronically Signed   By: Dorise Bullion III M.D   On: 11/30/2017 14:01   Dg Hip Unilat W Or Wo Pelvis 2-3 Views Left  Result Date: 11/30/2017 CLINICAL DATA:  Pain after fall EXAM: DG HIP (WITH OR WITHOUT  PELVIS) 2-3V LEFT COMPARISON:  None. FINDINGS: There is a comminuted fracture through the left femoral neck, extending through the intertrochanteric region with angulation. No dislocation. 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 No other fractures identified. IMPRESSION: Left proximal femoral fracture as above. Electronically  Signed   By: Dorise Bullion III M.D   On: 11/30/2017 14:00   ASSESSMENT AND PLAN:  Leopold Smyers  is a 83 y.o. male with a known history of CKD, melanoma, hypertension, osteoarthritis and tobacco abuse.  According to RN, the patient is drunk and is not a good historian.  It is reported that the patient fell down 6 steps at home.  Reportedly there was alcohol involved.  * acute Left hip fracture status post mechanical fall. Patient has history of alcohol abuse. Apparently there was alcohol involved. His serum alcohol level is less than 10. -on CIWA protocol -Pain control, orthopedic surgery consult for surgery with Dr. Mack Guise. -DVT prophylaxis after surgery.  - PT and OT evaluation after surgery. -patient has low to intermediate risk for hip fracture.  *Acute renal failure on CKD stage III with acidosis -Baseline creatinine 2.0 -came in with creatinine of 3.7-- IV fluids -Hold lisinopril -change IV fluids D5 water with bicarb  *Hypokalemia pharmacy to replace electrolytes  *Leukocytosis.  Possible due to reaction.   -negative for UTI -urine drug screen unable to report due to some interfering substance  *Hypertension.  Hold hypertension medication due to low side blood pressure.  *Anemia of chronic disease, stable.  *Alcohol use.  Start CIWA protocol.  No family in the room.  Case discussed with Care Management/Social Worker. Management plans discussed with the patient, family and they are in agreement.  CODE STATUS: full  DVT Prophylaxis: pending hip surgery continue SCD  TOTAL TIME TAKING CARE OF THIS PATIENT: *25* minutes.  >50% time spent on counselling and coordination of care  POSSIBLE D/C IN few DAYS, DEPENDING ON CLINICAL CONDITION.  Note: This dictation was prepared with Dragon dictation along with smaller phrase technology. Any transcriptional errors that result from this process are unintentional.  Fritzi Mandes M.D on 12/01/2017 at 7:42 AM  Between 7am to  6pm - Pager - [phone]  After 6pm go to [email or web address] - password [email or web address] [facility]  Office  [phone]  CC: Primary care physician; Leone Haven, MDPatient ID: Sherrilyn Rist, male   DOB: 1934-09-27, 83 y.o.   MRN: [id number]

## 2017-12-01 NOTE — Progress Notes (Signed)
Clinical Education officer, museum (CSW) presented bed offers to patient's son Eidson. He chose [facility]. Per Otila Kluver [facility] liaison she will start [facility] authorization. CSW will send [facility] PT notes when available.   [employer], LCSW [phone]

## 2017-12-01 NOTE — Clinical Social Work Note (Signed)
Clinical Social Work Assessment  Patient Details  Name: Justin Andrews MRN: [id number] Date of Birth: 27-Jun-1934  Date of referral:  12/01/17               Reason for consult:  Facility Placement                Permission sought to share information with:  Chartered certified accountant granted to share information::  Yes, Verbal Permission Granted  Name::      Justin Andrews::   [place]   Relationship::     Contact Information:     Housing/Transportation Living arrangements for the past 2 months:  Tour manager of Information:  Adult Children Patient Interpreter Needed:  None Criminal Activity/Legal Involvement Pertinent to Current Situation/Hospitalization:  No - Comment as needed Significant Relationships:  Adult Children Lives with:  Self Do you feel safe going back to the place where you live?    Need for family participation in patient care:  Yes (Comment)  Care giving concerns:  Patient lives in a boarding house in [place].    Social Worker assessment / plan:  Holiday representative ([place]) reviewed chart and noted that patient has a hip fracture. Surgery and PT are pending. CSW attempted to meet with patient however he was confused and could not participate in assessment. CSW left patient's son Justin Andrews [phone] a voicemail. Justin Andrews did call CSW back and state that he was in the room. CSW met with patient's son outside of the room to complete assessment. Per son he is patient's only adult child and patient has no HPOA. Per Justin Andrews patient has been staying at a boarding house in [place]. Per son patient drinks alcohol and has had issues with it for many years. CSW explained that PT will evaluate patient and make a recommendation of SNF or home health. CSW explained that if SNF is needed then [facility] will have to approve it. Son reported that patient will have to go to SNF for a few weeks for rehab. FL2 complete and faxed out. Per son he  lives in [place]. CSW will continue to follow and assist as needed.   Employment status:  Disabled (Comment on whether or not currently receiving Disability) Insurance information:  Managed Medicare PT Recommendations:  Not assessed at this time Information / Referral to community resources:  [place]  Patient/Family's Response to care:  Patient's son is agreeable to [employer] in [place].   Patient/Family's Understanding of and Emotional Response to Diagnosis, Current Treatment, and Prognosis:  Patient's son was very pleasant and thanked CSW for assistance.   Emotional Assessment Appearance:  Appears stated age Attitude/Demeanor/Rapport:  Unable to Assess Affect (typically observed):  Unable to Assess Orientation:  Oriented to Self, Fluctuating Orientation (Suspected and/or reported Sundowners) Alcohol / Substance use:  Not Applicable Psych involvement (Current and /or in the community):  No (Comment)  Discharge Needs  Concerns to be addressed:  Discharge Planning Concerns Readmission within the last 30 days:  No Current discharge risk:  Dependent with Mobility Barriers to Discharge:  Continued Medical Work up   [employer], Veronia Beets, LCSW 12/01/2017, 1:51 PM

## 2017-12-01 NOTE — Anesthesia Post-op Follow-up Note (Signed)
Anesthesia QCDR form completed.        

## 2017-12-01 NOTE — Op Note (Signed)
12/01/2017  7:31 PM  PATIENT:  Justin Andrews    PRE-OPERATIVE DIAGNOSIS:  LEFT FOUR PART INTERTROCHANTERIC HIP FRACTURE  POST-OPERATIVE DIAGNOSIS:  Same  PROCEDURE:  INTRAMEDULLARY (IM) NAIL FOR LEFT INTERTROCHANTERIC HIP FRACTURE  SURGEON:  Thornton Park, MD  ANESTHESIA:   General  PREOPERATIVE INDICATIONS:  Justin Andrews is a  83 y.o. male with a diagnosis of LEFT HIP FRACTURE who was recommended for intramedullary fixation given the comminuted and displaced nature of the fracture..    The risks, benefits and alternatives were discussed with the patient's son given the patient's confusion.  The risks include but are not limited to infection, bleeding, nerve or blood vessel injury, malunion, nonunion, hardware prominence, hardware failure, change in leg lengths or lower extremity rotation need for further surgery including hardware removal with conversion to a total hip arthroplasty. Medical risks include but are not limited to DVT and pulmonary embolism, myocardial infarction, stroke, pneumonia, respiratory failure and death. The patient's son understood these risks and wished to proceed with surgery.  OPERATIVE PROCEDURE:  The patient was brought to the operating room and placed in the supine position on the fracture table. General anesthesia was administered.  A closed reduction was performed under C-arm guidance.  The fracture reduction was confirmed on both AP and lateral views. A time out was performed to verify the patient's name, date of birth, medical record number, correct site of surgery correct procedure to be performed. The timeout was also used to verify the patient received antibiotics and all appropriate instruments, implants and radiographic studies were available in the room. Once all in attendance were in agreement, the case began. The patient was prepped and draped in a sterile fashion.  He received preoperative antibiotics with 2 g of Ancef.  An incision was made  proximal to the greater trochanter in line with the femur. A guidewire was placed over the tip of the greater trochanter and advanced into the proximal femur to the level of the lesser trochanter.  Confirmation of the drill pin position was made on AP and lateral C-arm images.  The threaded guidepin was then overdrilled with the proximal femoral drill.  A ball-tipped guidewire was then advanced across the fracture and down the femoral shaft to the knee. Its position at both the hip and the knee was confirmed on C-arm imaging. A depth gauge was used to measure the length of the long nail to be used. It was measured to be 11 x 400 mm. The nail was then inserted into the proximal femur, across the fracture site and down the femoral shaft. Its position was confirmed on AP and lateral C-arm images.   Once the nail was completely seated, the drill guide for the lag screw was placed through the guide arm for the Affixus nail. A guidepin was then placed through this drill guide and advanced through the lateral cortex of the femur, across the fracture site and into the femoral head achieving a tip apex distance of less than 25 mm. The length of the drill pin was measured, and then the drill for the lag screw was advanced through the lateral cortex, across the fracture site and up into the femoral head to the depth of the lag screw..  The lag screw was then advanced by hand into position across the fracture site into the femoral head. Its final position was confirmed on AP and lateral C-arm images. Compression was applied as traction was carefully released. The set screw in the  top of the intramedullary rod was tightened by hand using a screwdriver. It was backed off a quarter turn to allow for compression at the fracture site.  The attention was then turned to placement of the distal interlocking screws. A perfect circle technique was used. 2 small stab incisions were made over the distal interlocking screw holes. A free  hand technique was used to drill both distal interlocking screws. The depth of the screws was measured with a depth gauge. The appropriate length screw was then advanced into position and tightened by hand.  2 screws, 48 and 50 mm in length were used for distal interlocking screws.  Final C-arm images of the entire intramedullary construct were taken in both the AP and lateral planes.   The wounds were irrigated copiously and closed with 0 Vicryl for closure of the deep fascia and 2-0 Vicryl for subcutaneous closure. The skin was approximated with staples. A dry sterile dressing was applied. I was scrubbed and present the entire case and all sharp and instrument counts were correct at the conclusion of the case. Patient was transferred to hospital bed and brought to PACU in stable condition.  I spoke with the patient's son postoperatively to let him know the case was performed without complication and the patient was stable in recovery room.    Timoteo Gaul, MD

## 2017-12-01 NOTE — Transfer of Care (Signed)
Immediate Anesthesia Transfer of Care Note  Patient: Justin Andrews  Procedure(s) Performed: INTRAMEDULLARY (IM) NAIL INTERTROCHANTRIC (Left )  Patient Location: PACU  Anesthesia Type:General  Level of Consciousness: drowsy and confused  Airway & Oxygen Therapy: Patient Spontanous Breathing and Patient connected to face mask  Post-op Assessment: Report given to RN and Post -op Vital signs reviewed and stable  Post vital signs: Reviewed and stable  Last Vitals:  Vitals Value Taken Time  BP    Temp    Pulse 95 12/01/2017  7:32 PM  Resp 23 12/01/2017  7:32 PM  SpO2 94 % 12/01/2017  7:32 PM  Vitals shown include unvalidated device data.  Last Pain:  Vitals:   12/01/17 1512  TempSrc: Axillary  PainSc:          Complications: No apparent anesthesia complications

## 2017-12-01 NOTE — Clinical Social Work Placement (Signed)
   CLINICAL SOCIAL WORK PLACEMENT  NOTE  Date:  12/01/2017  Patient Details  Name: Justin Andrews MRN: [id number] Date of Birth: Dec 09, 1934  Clinical Social Work is seeking post-discharge placement for this patient at the [place] level of care (*CSW will initial, date and re-position this form in  chart as items are completed):  Yes   Patient/family provided with [facility] Work Department's list of facilities offering this level of care within the geographic area requested by the patient (or if unable, by the patient's family).  Yes   Patient/family informed of their freedom to choose among providers that offer the needed level of care, that participate in Medicare, Medicaid or managed care program needed by the patient, have an available bed and are willing to accept the patient.  Yes   Patient/family informed of [facility]'s ownership interest in [facility] and [facility], as well as of the fact that they are under no obligation to receive care at these facilities.  PASRR submitted to EDS on 12/01/17     PASRR number received on 12/01/17     Existing PASRR number confirmed on       FL2 transmitted to all facilities in geographic area requested by pt/family on 12/01/17     FL2 transmitted to all facilities within larger geographic area on       Patient informed that his/her managed care company has contracts with or will negotiate with certain facilities, including the following:            Patient/family informed of bed offers received.  Patient chooses bed at       Physician recommends and patient chooses bed at      Patient to be transferred to   on  .  Patient to be transferred to facility by       Patient family notified on   of transfer.  Name of family member notified:        PHYSICIAN       Additional Comment:    _______________________________________________ Jamilette Suchocki, Veronia Beets, LCSW 12/01/2017, 1:50 PM

## 2017-12-01 NOTE — Anesthesia Procedure Notes (Signed)
Procedure Name: Intubation Date/Time: 12/01/2017 5:30 PM Performed by: Dionne Bucy, CRNA Pre-anesthesia Checklist: Patient identified, Patient being monitored, Timeout performed, Emergency Drugs available and Suction available Patient Re-evaluated:Patient Re-evaluated prior to induction Oxygen Delivery Method: Circle system utilized Preoxygenation: Pre-oxygenation with 100% oxygen Induction Type: IV induction, Rapid sequence and Cricoid Pressure applied Laryngoscope Size: Mac and 4 Grade View: Grade II Tube type: Oral Tube size: 7.0 mm Number of attempts: 1 Airway Equipment and Method: Stylet Placement Confirmation: ETT inserted through vocal cords under direct vision,  positive ETCO2 and breath sounds checked- equal and bilateral Secured at: 21 cm Tube secured with: Tape Dental Injury: Teeth and Oropharynx as per pre-operative assessment

## 2017-12-01 NOTE — Anesthesia Preprocedure Evaluation (Addendum)
Anesthesia Evaluation  Patient identified by MRN, date of birth, ID band Patient awake    Reviewed: Allergy & Precautions, H&P , NPO status , Patient's Chart, lab work & pertinent test results, reviewed documented beta blocker date and time   Airway Mallampati: II   Neck ROM: full    Dental  (+) Poor Dentition   Pulmonary neg pulmonary ROS, Current Smoker,    Pulmonary exam normal        Cardiovascular Exercise Tolerance: Poor hypertension, On Medications + DOE  negative cardio ROS Normal cardiovascular exam Rhythm:regular Rate:Normal     Neuro/Psych PSYCHIATRIC DISORDERS Anxiety Depression Dementia and confusion baseline history with ETOH abuse.  negative neurological ROS  negative psych ROS   GI/Hepatic negative GI ROS, Neg liver ROS,   Endo/Other  negative endocrine ROS  Renal/GU Renal diseasenegative Renal ROS  negative genitourinary   Musculoskeletal   Abdominal   Peds  Hematology negative hematology ROS (+) Blood dyscrasia, anemia ,   Anesthesia Other Findings Past Medical History: No date: Elevated serum creatinine No date: History of melanoma No date: Hypertension No date: Osteoarthritis No date: Tobacco abuse Past Surgical History: No date: ANKLE FRACTURE SURGERY No date: EXTERNAL EAR SURGERY No date: ROTATOR CUFF REPAIR No date: TONSILLECTOMY No date: TYMPANOPLASTY W/ MASTOIDECTOMY     Comment:  Patient with reports of mastoid surgery (appears to have              had surgery on TM; Left). BMI    Body Mass Index:  21.74 kg/m     Reproductive/Obstetrics negative OB ROS                             Anesthesia Physical Anesthesia Plan  ASA: III and emergent  Anesthesia Plan: General and General ETT   Post-op Pain Management:    Induction:   PONV Risk Score and Plan:   Airway Management Planned:   Additional Equipment:   Intra-op Plan:   Post-operative  Plan:   Informed Consent: I have reviewed the patients History and Physical, chart, labs and discussed the procedure including the risks, benefits and alternatives for the proposed anesthesia with the patient or authorized representative who has indicated his/her understanding and acceptance.   Dental Advisory Given  Plan Discussed with: CRNA  Anesthesia Plan Comments:        Anesthesia Quick Evaluation

## 2017-12-01 NOTE — NC FL2 (Signed)
[place] LEVEL OF CARE SCREENING TOOL     IDENTIFICATION  Patient Name: Justin Andrews Birthdate: 1934/11/01 Sex: male Admission Date (Current Location): 11/30/2017  [place] and [place] Number:  Engineering geologist and Address:  [facility], [address] [place], [place] [address]      Provider Number: [id number]  Attending Physician Name and Address:  Fritzi Mandes, MD  Relative Name and Phone Number:       Current Level of Care: Hospital Recommended Level of Care: [place] Prior Approval Number:    Date Approved/Denied:   PASRR Number: [id number])  Discharge Plan: SNF    Current Diagnoses: Patient Active Problem List   Diagnosis Date Noted  . Closed left hip fracture ([place]) 11/30/2017  . Diarrhea 11/15/2017  . Duodenum disorder 11/15/2017  . Frequency of urination 10/21/2017  . Cigarette smoker 10/06/2017  . Cough 09/25/2017  . Changes in vision 09/25/2017  . Proteinuria 09/25/2017  . Basal cell carcinoma 02/28/2017  . 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 DOE (dyspnea on exertion) 01/17/2017  . Chronic right upper quadrant pain 11/15/2016  . Balance problem 11/15/2016  . Simple renal cyst 10/21/2016  . Prostate nodule 10/16/2016  . Dysuria 10/16/2016  . CKD (chronic kidney disease) stage 3, GFR 30-59 ml/min (HCC) 08/05/2016  . Anemia 08/05/2016  . Anxiety and depression 08/05/2016  . Weight loss 08/05/2016  . Sleep disturbance 07/23/2016  . Hypertension   . Osteoarthritis   . Tobacco abuse   . 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[place]) 03/06/2016    Orientation RESPIRATION BLADDER Height & Weight     Self  Normal Incontinent Weight: 138 lb 12.8 oz (63 kg) Height:  5\' 7"  (170.2 cm)  BEHAVIORAL SYMPTOMS/MOOD NEUROLOGICAL BOWEL NUTRITION STATUS      Incontinent Diet(Diet: NPO for surgery to be advanced. )  AMBULATORY STATUS COMMUNICATION OF NEEDS Skin   Extensive Assist Verbally Surgical  wounds                       Personal Care Assistance Level of Assistance  Bathing, Feeding, Dressing Bathing Assistance: Limited assistance Feeding assistance: Independent Dressing Assistance: Limited assistance     Functional Limitations Info  Sight, Hearing, Speech Sight Info: Adequate Hearing Info: Adequate Speech Info: Adequate    SPECIAL CARE FACTORS FREQUENCY  PT (By licensed PT), OT (By licensed OT)     PT Frequency: (5) OT Frequency: (5)            Contractures      Additional Factors Info  Code Status, Allergies, Isolation Precautions Code Status Info: (Full Code. ) Allergies Info: (Aspirin)     Isolation Precautions Info: (Enteric precautions. )     Current Medications (12/01/2017):  This is the current hospital active medication list Current Facility-Administered Medications  Medication Dose Route Frequency Provider Last Rate Last Dose  . acetaminophen (TYLENOL) tablet 650 mg  650 mg Oral Q6H PRN Demetrios Loll, MD       Or  . acetaminophen (TYLENOL) suppository 650 mg  650 mg Rectal Q6H PRN Demetrios Loll, MD      . albuterol (PROVENTIL) (2.5 MG/3ML) 0.083% nebulizer solution 2.5 mg  2.5 mg Nebulization Q2H PRN Demetrios Loll, MD      . bisacodyl (DULCOLAX) EC tablet 5 mg  5 mg Oral Daily PRN Demetrios Loll, MD      . ceFAZolin (ANCEF) IVPB 2g/100 mL premix  2 g  Intravenous 30 min Pre-Op [place], MD      . Chlorhexidine Gluconate Cloth 2 % PADS 6 each  6 each Topical Daily Demetrios Loll, MD   6 each at 11/30/17 2100  . cyclobenzaprine (FLEXERIL) tablet 5 mg  5 mg Oral TID PRN Demetrios Loll, MD   5 mg at 12/01/17 0905  . docusate sodium (COLACE) capsule 100 mg  100 mg Oral BID Demetrios Loll, MD   100 mg at 11/30/17 2100  . famotidine (PEPCID) tablet 20 mg  20 mg Oral BID Demetrios Loll, MD   20 mg at 11/30/17 2100  . ferrous sulfate tablet 325 mg  325 mg Oral BID Demetrios Loll, MD   325 mg at 11/30/17 2100  . folic acid (FOLVITE) tablet 1 mg  1 mg Oral Daily Demetrios Loll,  MD      . HYDROcodone-acetaminophen (NORCO/VICODIN) 5-325 MG per tablet 1-2 tablet  1-2 tablet Oral Q4H PRN Fritzi Mandes, MD      . Influenza vac split quadrivalent PF (FLUZONE HIGH-DOSE) injection 0.5 mL  0.5 mL Intramuscular Tomorrow-1000 Demetrios Loll, MD      . LORazepam (ATIVAN) tablet 1 mg  1 mg Oral Q6H PRN Demetrios Loll, MD       Or  . LORazepam (ATIVAN) injection 1 mg  1 mg Intravenous Q6H PRN Demetrios Loll, MD      . methocarbamol (ROBAXIN) tablet 500 mg  500 mg Oral Q6H PRN Thornton Park, MD       Or  . methocarbamol (ROBAXIN) 500 mg in dextrose 5 % 50 mL IVPB  500 mg Intravenous Q6H PRN Thornton Park, MD      . morphine 2 MG/ML injection 2 mg  2 mg Intravenous Q6H PRN Fritzi Mandes, MD      . multivitamin with minerals tablet 1 tablet  1 tablet Oral Daily Demetrios Loll, MD      . mupirocin ointment (BACTROBAN) 2 % 1 application  1 application Nasal BID Demetrios Loll, MD   1 application at 56/38/75 [phone]  . nicotine (NICODERM CQ - dosed in mg/24 hours) patch 14 mg  14 mg Transdermal Daily Demetrios Loll, MD   14 mg at 12/01/17 0906  . ondansetron (ZOFRAN) tablet 4 mg  4 mg Oral Q6H PRN Demetrios Loll, MD       Or  . ondansetron [facility]) injection 4 mg  4 mg Intravenous Q6H PRN Demetrios Loll, MD      . potassium chloride 10 mEq in 100 mL IVPB  10 mEq Intravenous Juanita Craver, MD 100 mL/hr at 12/01/17 1139 10 mEq at 12/01/17 1139  . senna-docusate (Senokot-S) tablet 1 tablet  1 tablet Oral QHS PRN Demetrios Loll, MD      . sodium bicarbonate 100 mEq in dextrose 5 % 1,000 mL infusion   Intravenous Continuous Fritzi Mandes, MD 125 mL/hr at 12/01/17 [phone]    . tamsulosin (FLOMAX) capsule 0.4 mg  0.4 mg Oral Daily Demetrios Loll, MD      . thiamine (VITAMIN B-1) tablet 100 mg  100 mg Oral Daily Demetrios Loll, MD       Or  . thiamine (B-1) injection 100 mg  100 mg Intravenous Daily Demetrios Loll, MD   100 mg at 12/01/17 0914  . traZODone (DESYREL) tablet 25 mg  25 mg Oral QHS PRN Demetrios Loll, MD      . vitamin B-12  (CYANOCOBALAMIN) tablet 1,000 mcg  1,000 mcg Oral Daily Demetrios Loll, MD  Discharge Medications: Please see discharge summary for a list of discharge medications.  Relevant Imaging Results:  Relevant Lab Results:   Additional Information (SSN: [id number])  Kamilya Wakeman, Veronia Beets, LCSW

## 2017-12-01 NOTE — Anesthesia Postprocedure Evaluation (Signed)
Anesthesia Post Note  Patient: Justin Andrews  Procedure(s) Performed: INTRAMEDULLARY (IM) NAIL INTERTROCHANTRIC (Left )  Patient location during evaluation: PACU Anesthesia Type: General Level of consciousness: awake and alert Pain management: pain level controlled Vital Signs Assessment: post-procedure vital signs reviewed and stable Respiratory status: spontaneous breathing, nonlabored ventilation, respiratory function stable and patient connected to nasal cannula oxygen Cardiovascular status: blood pressure returned to baseline and stable Postop Assessment: no apparent nausea or vomiting Anesthetic complications: no     Last Vitals:  Vitals:   12/01/17 2040 12/01/17 2112  BP: (!) 142/64 (!) 158/68  Pulse: 91 93  Resp: 16 17  Temp: (!) 36.2 C (!) 36.2 C  SpO2: 94% 99%    Last Pain:  Vitals:   12/01/17 2112  TempSrc: Axillary  PainSc:                  [name]

## 2017-12-01 NOTE — Progress Notes (Signed)
Pt is unable to take PO medications due to altered mental status. Pt is only alert to himself at this time. Son at bedside, Son has signed consent for the pt. Per son the pt has tramadol prescription and has refilled 180 of them on 16th, he says the pt now only has 60 pills left. Son says that the pt has burn holes in the curtains and bedding from falling asleep or passing out while smoking. Per son there have been times that pt has called him stating "If I had a gun I would kill myself." No concerns for self harm at this time. RN will make MD aware and monitor the pt.

## 2017-12-02 ENCOUNTER — Encounter: Payer: Self-pay | Admitting: Orthopedic Surgery

## 2017-12-02 LAB — BASIC METABOLIC PANEL
Anion gap: 5 (ref 5–15)
BUN: 23 mg/dL (ref 8–23)
CALCIUM: 7.8 mg/dL — AB (ref 8.9–10.3)
CO2: 23 mmol/L (ref 22–32)
Chloride: 116 mmol/L — ABNORMAL HIGH (ref 98–111)
Creatinine, Ser: 2.31 mg/dL — ABNORMAL HIGH (ref 0.61–1.24)
GFR calc Af Amer: 29 mL/min — ABNORMAL LOW (ref >60)
GFR, EST NON AFRICAN AMERICAN: 25 mL/min — AB (ref >60)
Glucose, Bld: 129 mg/dL — ABNORMAL HIGH (ref 70–99)
Potassium: 3.5 mmol/L (ref 3.5–5.1)
Sodium: 144 mmol/L (ref 135–145)

## 2017-12-02 LAB — CBC
HEMATOCRIT: 30.7 % — AB (ref 39.0–52.0)
Hemoglobin: 9.6 g/dL — ABNORMAL LOW (ref 13.0–17.0)
MCH: 31.6 pg (ref 26.0–34.0)
MCHC: 31.3 g/dL (ref 30.0–36.0)
MCV: 101 fL — ABNORMAL HIGH (ref 80.0–100.0)
PLATELETS: 352 10*3/uL (ref 150–400)
RBC: 3.04 MIL/uL — ABNORMAL LOW (ref 4.22–5.81)
RDW: 16.1 % — AB (ref 11.5–15.5)
WBC: 14.5 10*3/uL — ABNORMAL HIGH (ref 4.0–10.5)
nRBC: 0 % (ref 0.0–0.2)

## 2017-12-02 MED ORDER — VITAMIN C 500 MG PO TABS
250.0000 mg | ORAL_TABLET | Freq: Two times a day (BID) | ORAL | Status: DC
  Administered 2017-12-02 – 2017-12-03 (×3): 250 mg via ORAL
  Filled 2017-12-02 (×3): qty 1

## 2017-12-02 MED ORDER — CARVEDILOL 3.125 MG PO TABS
6.2500 mg | ORAL_TABLET | Freq: Two times a day (BID) | ORAL | Status: DC
  Administered 2017-12-02 – 2017-12-03 (×3): 6.25 mg via ORAL
  Filled 2017-12-02 (×3): qty 2

## 2017-12-02 MED ORDER — SERTRALINE HCL 50 MG PO TABS
150.0000 mg | ORAL_TABLET | Freq: Every day | ORAL | Status: DC
  Administered 2017-12-02 – 2017-12-03 (×2): 150 mg via ORAL
  Filled 2017-12-02 (×2): qty 3

## 2017-12-02 MED ORDER — OXYCODONE HCL 5 MG PO TABS
5.0000 mg | ORAL_TABLET | ORAL | Status: DC | PRN
  Administered 2017-12-02 (×2): 5 mg via ORAL
  Filled 2017-12-02 (×2): qty 1

## 2017-12-02 MED ORDER — AMLODIPINE BESYLATE 10 MG PO TABS
10.0000 mg | ORAL_TABLET | Freq: Every day | ORAL | Status: DC
  Administered 2017-12-02 – 2017-12-03 (×2): 10 mg via ORAL
  Filled 2017-12-02 (×2): qty 1

## 2017-12-02 MED ORDER — ENSURE ENLIVE PO LIQD
237.0000 mL | Freq: Two times a day (BID) | ORAL | Status: DC
  Administered 2017-12-02 – 2017-12-03 (×2): 237 mL via ORAL

## 2017-12-02 MED ORDER — TRAMADOL HCL 50 MG PO TABS
50.0000 mg | ORAL_TABLET | Freq: Four times a day (QID) | ORAL | Status: DC | PRN
  Administered 2017-12-02 – 2017-12-03 (×3): 50 mg via ORAL
  Filled 2017-12-02 (×3): qty 1

## 2017-12-02 MED ORDER — GABAPENTIN 100 MG PO CAPS
200.0000 mg | ORAL_CAPSULE | Freq: Three times a day (TID) | ORAL | Status: DC
  Administered 2017-12-02 – 2017-12-03 (×3): 200 mg via ORAL
  Filled 2017-12-02 (×3): qty 2

## 2017-12-02 MED ORDER — ENOXAPARIN SODIUM 30 MG/0.3ML ~~LOC~~ SOLN
30.0000 mg | SUBCUTANEOUS | Status: DC
  Administered 2017-12-02 – 2017-12-03 (×2): 30 mg via SUBCUTANEOUS
  Filled 2017-12-02 (×2): qty 0.3

## 2017-12-02 MED ORDER — CALCIUM CARBONATE-VITAMIN D 500-200 MG-UNIT PO TABS
1.0000 | ORAL_TABLET | Freq: Two times a day (BID) | ORAL | Status: DC
  Administered 2017-12-03: 1 via ORAL
  Filled 2017-12-02: qty 1

## 2017-12-02 MED ORDER — FOLIC ACID 1 MG PO TABS: 1.0000 mg | ORAL_TABLET | Freq: Every day | ORAL | Status: DC

## 2017-12-02 NOTE — Progress Notes (Signed)
Patient with ~CrCl = 22.3. Gabapentin dose 900mg /day. Max daily dose with CrCl 15-30 is 700mg /day. Dose of gabapentin reduced to 200mg  tid per MD.

## 2017-12-02 NOTE — Progress Notes (Signed)
Subjective:  POD #1 s/p IM fixation for 4 part intertrochanteric hip fracture.  Patient reports left hip pain as mild to moderate.    Objective:   VITALS:   Vitals:   12/02/17 0222 12/02/17 0332 12/02/17 0826 12/02/17 1128  BP: 137/68 135/63 140/64 (!) 144/68  Pulse: 91 84 93 90  Resp: 17 17  18   Temp: 99 F (37.2 C) 99.2 F (37.3 C) 97.9 F (36.6 C)   TempSrc: Axillary Axillary Oral   SpO2: 100% 100% 95% 100%  Weight:      Height:        PHYSICAL EXAM: Patient is resting comfortably in bed.   Left lower extremity: Neurovascular intact Sensation intact distally Intact pulses distally Dorsiflexion/Plantar flexion intact Incision: scant drainage No cellulitis present Compartment soft  LABS  Results for orders placed or performed during the hospital encounter of 11/30/17 (from the past 24 hour(s))  Urine Drug Screen, Qualitative ([facility] only)     Status: Abnormal   Collection Time: 12/01/17 12:11 PM  Result Value Ref Range   Tricyclic, Ur Screen NONE DETECTED NONE DETECTED   Amphetamines, Ur Screen NONE DETECTED NONE DETECTED   MDMA (Ecstasy)Ur Screen NONE DETECTED NONE DETECTED   Cocaine Metabolite,Ur [place] NONE DETECTED NONE DETECTED   Opiate, Ur Screen POSITIVE (A) NONE DETECTED   Phencyclidine (PCP) Ur S NONE DETECTED NONE DETECTED   Cannabinoid 50 Ng, Ur [place] NONE DETECTED NONE DETECTED   Barbiturates, Ur Screen NONE DETECTED NONE DETECTED   Benzodiazepine, Ur Scrn NONE DETECTED NONE DETECTED   Methadone Scn, Ur NONE DETECTED NONE DETECTED  Basic metabolic panel     Status: Abnormal   Collection Time: 12/01/17  8:53 PM  Result Value Ref Range   Sodium 144 135 - 145 mmol/L   Potassium 4.0 3.5 - 5.1 mmol/L   Chloride 116 (H) 98 - 111 mmol/L   CO2 16 (L) 22 - 32 mmol/L   Glucose, Bld 155 (H) 70 - 99 mg/dL   BUN 22 8 - 23 mg/dL   Creatinine, Ser 2.61 (H) 0.61 - 1.24 mg/dL   Calcium 8.0 (L) 8.9 - 10.3 mg/dL   GFR calc non Af Amer 21 (L) >60 mL/min   GFR calc Af  Amer 25 (L) >60 mL/min   Anion gap 12 5 - 15  CBC     Status: Abnormal   Collection Time: 12/02/17  4:45 AM  Result Value Ref Range   WBC 14.5 (H) 4.0 - 10.5 K/uL   RBC 3.04 (L) 4.22 - 5.81 MIL/uL   Hemoglobin 9.6 (L) 13.0 - 17.0 g/dL   HCT 30.7 (L) 39.0 - 52.0 %   MCV 101.0 (H) 80.0 - 100.0 fL   MCH 31.6 26.0 - 34.0 pg   MCHC 31.3 30.0 - 36.0 g/dL   RDW 16.1 (H) 11.5 - 15.5 %   Platelets 352 150 - 400 K/uL   nRBC 0.0 0.0 - 0.2 %  Basic metabolic panel     Status: Abnormal   Collection Time: 12/02/17  4:45 AM  Result Value Ref Range   Sodium 144 135 - 145 mmol/L   Potassium 3.5 3.5 - 5.1 mmol/L   Chloride 116 (H) 98 - 111 mmol/L   CO2 23 22 - 32 mmol/L   Glucose, Bld 129 (H) 70 - 99 mg/dL   BUN 23 8 - 23 mg/dL   Creatinine, Ser 2.31 (H) 0.61 - 1.24 mg/dL   Calcium 7.8 (L) 8.9 - 10.3 mg/dL  GFR calc non Af Amer 25 (L) >60 mL/min   GFR calc Af Amer 29 (L) >60 mL/min   Anion gap 5 5 - 15    Ct Head Wo Contrast  Result Date: 11/30/2017 CLINICAL DATA:  Minor head trauma EXAM: CT HEAD WITHOUT CONTRAST CT CERVICAL SPINE WITHOUT CONTRAST TECHNIQUE: Multidetector CT imaging of the head and cervical spine was performed following the standard protocol without intravenous contrast. Multiplanar CT image reconstructions of the cervical spine were also generated. COMPARISON:  11/12/2009 head CT FINDINGS: CT HEAD FINDINGS Brain: No evidence of acute infarction, hemorrhage, hydrocephalus, extra-axial collection or mass lesion/mass effect. Moderate low-density in the cerebral white matter attributed to chronic small vessel ischemia. Cerebral volume loss that is generalized Vascular: Atherosclerotic calcification Skull: Negative for fracture. Left mastoidectomy with chronically opacified bowl. The middle ear is also opacified today, progressed from 2011. Orbits: No evidence of injury CT CERVICAL SPINE FINDINGS Alignment: No traumatic malalignment. Skull base and vertebrae: Negative for fracture Soft  tissues and spinal canal: No prevertebral fluid or swelling. No visible canal hematoma. Disc levels: Diffuse degenerative disc narrowing and ridging with foraminal stenosis. Upper chest: Emphysema IMPRESSION: 1. No evidence of acute intracranial or cervical spine injury. 2. Chronic findings are described above. Electronically Signed   By: Monte Fantasia M.D.   On: 11/30/2017 13:48   Ct Cervical Spine Wo Contrast  Result Date: 11/30/2017 CLINICAL DATA:  Minor head trauma EXAM: CT HEAD WITHOUT CONTRAST CT CERVICAL SPINE WITHOUT CONTRAST TECHNIQUE: Multidetector CT imaging of the head and cervical spine was performed following the standard protocol without intravenous contrast. Multiplanar CT image reconstructions of the cervical spine were also generated. COMPARISON:  11/12/2009 head CT FINDINGS: CT HEAD FINDINGS Brain: No evidence of acute infarction, hemorrhage, hydrocephalus, extra-axial collection or mass lesion/mass effect. Moderate low-density in the cerebral white matter attributed to chronic small vessel ischemia. Cerebral volume loss that is generalized Vascular: Atherosclerotic calcification Skull: Negative for fracture. Left mastoidectomy with chronically opacified bowl. The middle ear is also opacified today, progressed from 2011. Orbits: No evidence of injury CT CERVICAL SPINE FINDINGS Alignment: No traumatic malalignment. Skull base and vertebrae: Negative for fracture Soft tissues and spinal canal: No prevertebral fluid or swelling. No visible canal hematoma. Disc levels: Diffuse degenerative disc narrowing and ridging with foraminal stenosis. Upper chest: Emphysema IMPRESSION: 1. No evidence of acute intracranial or cervical spine injury. 2. Chronic findings are described above. Electronically Signed   By: Monte Fantasia M.D.   On: 11/30/2017 13:48   Ct Abdomen Pelvis W Contrast  Result Date: 11/30/2017 CLINICAL DATA:  Pain after fall. EXAM: CT ABDOMEN AND PELVIS WITH CONTRAST TECHNIQUE:  Multidetector CT imaging of the abdomen and pelvis was performed using the standard protocol following bolus administration of intravenous contrast. CONTRAST:  66mL OMNIPAQUE IOHEXOL 300 MG/ML  SOLN COMPARISON:  November 21, 2016 FINDINGS: Lower chest: No acute abnormality. Hepatobiliary: No focal liver abnormality is seen. No gallstones, gallbladder wall thickening, or biliary dilatation. Pancreas: Fatty deposition in the pancreatic head, neck, and uncinate process. No acute pancreatic abnormality noted. Spleen: Normal in size without focal abnormality. Adrenals/Urinary Tract: Adrenal glands are normal. Multiple renal masses identified in the left kidney, consistent with cysts. No suspicious masses. No stones or hydronephrosis. No perinephric stranding. The ureters and bladder are normal. Stomach/Bowel: The stomach is normal. A duodenal diverticulum is much smaller in the interval with no evidence of active inflammation. Small bowel otherwise normal. The colon and appendix are normal. Vascular/Lymphatic: Atherosclerotic changes  are seen in the nonaneurysmal aorta. No adenopathy. Reproductive: Prostate is unremarkable. Other: No abdominal wall hernia or abnormality. No abdominopelvic ascites. Musculoskeletal: There is a comminuted fracture of the proximal left femur, extending through the intertrochanteric region with angulation. No dislocation. No other fractures. IMPRESSION: 1. Comminuted fracture through the intertrochanteric region of the left femur. No dislocation. 2. Renal cysts. 3. Atherosclerotic changes in the abdominal aorta. 4. No other acute abnormalities. Electronically Signed   By: Dorise Bullion III M.D   On: 11/30/2017 13:58   Dg Chest Portable 1 View  Result Date: 11/30/2017 CLINICAL DATA:  Pain after fall EXAM: PORTABLE CHEST 1 VIEW COMPARISON:  November 19, 2017 FINDINGS: The heart size and mediastinal contours are within normal limits. Both lungs are clear. The visualized skeletal  structures are unremarkable. IMPRESSION: No active disease. Electronically Signed   By: Dorise Bullion III M.D   On: 11/30/2017 14:01   Dg Hip Operative Unilat W Or W/o Pelvis Left  Result Date: 12/01/2017 CLINICAL DATA:  Intraoperative localization for ORIF of left hip fracture. EXAM: LEFT FEMUR PORTABLE 2 VIEWS; OPERATIVE LEFT HIP WITH PELVIS COMPARISON:  Prior radiograph from 11/30/2017 FINDINGS: Postoperative changes from interval ORIF of previously identified left hip fracture. Interval placement of IM nail with distal interlocking screws. Hardware appears well positioned without complication. Left hip fracture in gross anatomic alignment. No adverse features. Postoperative swelling and emphysema about the left hip and knee. Visualized bony pelvis intact. Vascular calcifications noted within the thigh. IMPRESSION: Sequelae of interval ORIF for proximal left femoral fracture without complication. Electronically Signed   By: Jeannine Boga M.D.   On: 12/01/2017 20:42   Dg Hip Unilat W Or Wo Pelvis 2-3 Views Left  Result Date: 11/30/2017 CLINICAL DATA:  Pain after fall EXAM: DG HIP (WITH OR WITHOUT PELVIS) 2-3V LEFT COMPARISON:  None. FINDINGS: There is a comminuted fracture through the left femoral neck, extending through the intertrochanteric region with angulation. No dislocation. 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 No other fractures identified. IMPRESSION: Left proximal femoral fracture as above. Electronically Signed   By: Dorise Bullion III M.D   On: 11/30/2017 14:00   Dg Femur Port Min 2 Views Left  Result Date: 12/01/2017 CLINICAL DATA:  Intraoperative localization for ORIF of left hip fracture. EXAM: LEFT FEMUR PORTABLE 2 VIEWS; OPERATIVE LEFT HIP WITH PELVIS COMPARISON:  Prior radiograph from 11/30/2017 FINDINGS: Postoperative changes from interval ORIF of previously identified left hip fracture. Interval placement of IM nail with distal interlocking screws. Hardware appears  well positioned without complication. Left hip fracture in gross anatomic alignment. No adverse features. Postoperative swelling and emphysema about the left hip and knee. Visualized bony pelvis intact. Vascular calcifications noted within the thigh. IMPRESSION: Sequelae of interval ORIF for proximal left femoral fracture without complication. Electronically Signed   By: Jeannine Boga M.D.   On: 12/01/2017 20:42    Assessment/Plan: 1 Day Post-Op   Active Problems:   Closed left hip fracture [facility])  The patient should continue physical therapy as tolerated.  He is weightbearing as tolerated on the left lower extremity.  Patient will require Lovenox daily for 4-6 weeks given his allergy to aspirin.  Patient's Foley catheter has been removed.  He will be discharged to skilled nursing facility pending insurance authorization after his hospital stay.    Thornton [place] , MD 12/02/2017, 12:02 PM

## 2017-12-02 NOTE — Progress Notes (Signed)
Tried to reach patient by phone no answer and voicemail is full cannot leave a message.

## 2017-12-02 NOTE — Evaluation (Signed)
Occupational Therapy Evaluation Patient Details Name: Justin Andrews MRN: [id number] DOB: [birth year] Today's Date: 12/02/2017    History of Present Illness 83 y/o male s/p fall with L hip fx, s/p IM nailing 11/25.   Clinical Impression   Pt seen for OT evaluation this date, POD#1 from above surgery. Pt stated he was independent in all ADLs and mobility prior to surgery. Pt has decreased caregiver support after discharge. Pt is eager to return to PLOF with less pain and improved safety and independence. Pt A&O x4 but had difficulties following one step commands consisently. Pt impulsive with movement with RW and needed VC to keep body within walker.  Pt demonstrates impairments in (see OT Problem List below)  requiring minimal assist for LB dressing and bathing while in seated position due to pain and limited AROM of L hip. Pt instructed in self care skills, pursed lip breathing for pain mgt,and DME/AE for LB bathing and dressing tasks. Pt verbalized/demonstrated understanding of education presented this date. Pt would benefit from further skilled OT intervention to address noted impairments to maximize independence and safety and minimize caregiver burden.  Recommend SNF upon discharge.     Follow Up Recommendations  SNF    Equipment Recommendations  None recommended by OT    Recommendations for Other Services       Precautions / Restrictions Precautions Precautions: None Restrictions Weight Bearing Restrictions: Yes LLE Weight Bearing: Weight bearing as tolerated      Mobility Bed Mobility Overal bed mobility: Modified Independent Bed Mobility: Supine to Sit     Supine to sit: Min assist     General bed mobility comments: increased time and effort to get self to EOB; no physical assist needed  Transfers Overall transfer level: Needs assistance Equipment used: Rolling walker (2 wheeled) Transfers: Sit to/from Stand Sit to Stand: Min guard         General transfer  comment: Pt was impulsive with movement with RW and required VC to keep body within walker.     Balance Overall balance assessment: Modified Independent                                         ADL either performed or assessed with clinical judgement   ADL Overall ADL's : Needs assistance/impaired                                       General ADL Comments: Pt requires Supervision for safety for UB dressing/bathing tasks; MIN A for LB dressing/bathing tasks. CGA for toilet transfer to [place] Vision/History: No visual deficits Patient Visual Report: No change from baseline       Perception     Praxis      Pertinent Vitals/Pain Pain Assessment: 0-10 Pain Score: 8  Pain Location: L hip Pain Intervention(s): Limited activity within patient's tolerance;Monitored during session;Repositioned;Premedicated before session     Hand Dominance     Extremity/Trunk Assessment Upper Extremity Assessment Upper Extremity Assessment: Overall WFL for tasks assessed(BUE grip strength 4+/5, Full AROM flexion/extension in bilat shoulders)   Lower Extremity Assessment Lower Extremity Assessment: Generalized weakness(post op expected deficiencies)       Communication Communication Communication: HOH   Cognition Arousal/Alertness: Awake/alert Behavior During Therapy: Impulsive;WFL for tasks assessed/performed Overall Cognitive  Status: No family/caregiver present to determine baseline cognitive functioning                                 General Comments: Pt A&O x4; trouble following one step commands consisently; slow to complete Finger to nose test.   General Comments       Exercises  Other Exercises Other Exercises: Pt educated in pursed lip breathing for pain mgt   Shoulder Instructions      Home Living Family/patient expects to be discharged to:: Skilled nursing facility Living Arrangements: Group Home                                       Prior Functioning/Environment Level of Independence: Independent        Comments: Pt stated independent for all ADL tasks and mobility        OT Problem List: Decreased strength;Decreased cognition;Decreased range of motion;Impaired balance (sitting and/or standing);Decreased safety awareness;Pain;Decreased knowledge of use of DME or AE      OT Treatment/Interventions: Self-care/ADL training;Therapeutic exercise;DME and/or AE instruction;Therapeutic activities;Patient/family education;Balance training;Cognitive remediation/compensation    OT Goals(Current goals can be found in the care plan section) Acute Rehab OT Goals Patient Stated Goal: get stronger OT Goal Formulation: With patient Time For Goal Achievement: 12/16/17 Potential to Achieve Goals: Good ADL Goals Pt Will Perform Lower Body Dressing: with supervision;sit to/from stand;with adaptive equipment Additional ADL Goal #1: Pt will utilize at least 2 falls prevention strategies when completing ADL task. Additional ADL Goal #2: Pt will independently utilize  pursed lip breathing as a coping mechanism for pain when completing ADL task.  OT Frequency: Min 1X/week   Barriers to D/C:            Co-evaluation              AM-PAC OT "6 Clicks" Daily Activity     Outcome Measure Help from another person eating meals?: None Help from another person taking care of personal grooming?: None Help from another person toileting, which includes using toliet, bedpan, or urinal?: A Little Help from another person bathing (including washing, rinsing, drying)?: A Little Help from another person to put on and taking off regular upper body clothing?: None Help from another person to put on and taking off regular lower body clothing?: A Little 6 Click Score: 21   End of Session Equipment Utilized During Treatment: Gait belt;Rolling walker  Activity Tolerance: Patient tolerated treatment  well Patient left: in bed;with call bell/phone within reach;with bed alarm set  OT Visit Diagnosis: Other abnormalities of gait and mobility (R26.89);Pain Pain - Right/Left: Left Pain - part of body: Hip                Time: 9767-3419 OT Time Calculation (min): 26 min Charges:     Jadene Pierini OTS  12/02/2017, 1:02 PM

## 2017-12-02 NOTE — Progress Notes (Signed)
Initial Nutrition Assessment  DOCUMENTATION CODES:   Severe malnutrition in context of social or environmental circumstances  INTERVENTION:   Ensure Enlive po BID, each supplement provides 350 kcal and 20 grams of protein  MVI, thiamine and folic acid in the setting of etoh abuse  Magic cup TID with meals, each supplement provides 290 kcal and 9 grams of protein  Vitamin C 237m po BID  Oscal with D po BID   Pt at high refeeding risk; recommend monitor K, Mg and P labs   NUTRITION DIAGNOSIS:   Severe Malnutrition related to social / environmental circumstances(etoh abuse ) as evidenced by moderate to severe fat depletions, moderate to severe muscle depletions.  GOAL:   Patient will meet greater than or equal to 90% of their needs  MONITOR:   PO intake, Supplement acceptance, Labs, Weight trends, Skin, I & O's  REASON FOR ASSESSMENT:   Malnutrition Screening Tool    ASSESSMENT:   83y.o. male with a known history of CKD, melanoma, hypertension, osteoarthritis and tobacco and etoh abuse admitted with hip fracture after fall now s/p repair 11/25  Met with pt in room today. Pt lethargic at time of RD visit and answering all questions with yes/no. Pt is reportedly a poor historian at baseline. Pt unable to tell RD if he had anything to eat today or not but reports that he does like Ensure. Per chart, pt is weight stable pta. Suspect pt with poor appetite and oral intake at baseline r/t etoh abuse. Pt likely at high refeeding risk; monitor K, Mg and P labs once oral intake improves. RD will order supplements and vitamins to encourage post op healing.   Medications reviewed and include: colace, lovenox, pepcid, ferrous sulfate, folic acid, MVI, nicotine, thiamine, B12  Labs reviewed: K 3.5 wnl, creat 2.31(H) P 3.0 wnl, Mg 2.1 wnl- 11/24 WBC- 14.5(H), Hgb 9.6(L), Hct 30.7(L), MCV 101(H)  NUTRITION - FOCUSED PHYSICAL EXAM:    Most Recent Value  Orbital Region  Mild  depletion  Upper Arm Region  Severe depletion  Thoracic and Lumbar Region  Severe depletion  Buccal Region  Moderate depletion  Temple Region  Mild depletion  Clavicle Bone Region  Mild depletion  Clavicle and Acromion Bone Region  Mild depletion  Scapular Bone Region  Moderate depletion  Dorsal Hand  Moderate depletion  Patellar Region  Severe depletion  Anterior Thigh Region  Severe depletion  Posterior Calf Region  Severe depletion  Edema (RD Assessment)  None  Hair  Reviewed  Eyes  Reviewed  Mouth  Reviewed  Skin  Reviewed  Nails  Reviewed     Diet Order:   Diet Order            Diet regular Room service appropriate? Yes; Fluid consistency: Thin  Diet effective now             EDUCATION NEEDS:   Not appropriate for education at this time  Skin:  Skin Assessment: Reviewed RN Assessment(Ecchymosis, incision L hip)  Last BM:  11/26  Height:   Ht Readings from Last 1 Encounters:  11/30/17 _0  (1.702 m)    Weight:   Wt Readings from Last 1 Encounters:  11/30/17 63 kg    Ideal Body Weight:  67.3 kg  BMI:  Body mass index is 21.74 kg/m.  Estimated Nutritional Needs:   Kcal:  1700-1900kcal/day   Protein:  75-88g/day   Fluid:  >1.6L/day   CKoleen DistanceMS, RD, LDN Pager #-[phone]  Office#- [phone] After Hours Pager: [phone]

## 2017-12-02 NOTE — Progress Notes (Signed)
Clinical Education officer, museum (CSW) discussed long term care with patient's son [place]. CSW explained that patient will have to pay privately for long term care, which he can't per son or apply for medicaid. CSW explained to son how to apply for long term care medicaid at [facility]. Plan is for patient to D/C to [facility] pending [employer] authorization.   [employer], LCSW [phone]

## 2017-12-02 NOTE — Progress Notes (Signed)
Physical Therapy Treatment Patient Details Name: Justin Andrews MRN: [id number] DOB: 1934-11-04 Today's Date: 12/02/2017    History of Present Illness 83 y/o male s/p fall with L hip fx, s/p IM nailing 11/25.    PT Comments    Pt did better this afternoon with all aspects of PT but was still very labored and limited with mobility and though he was able to show some increased strength with exercises still needed AAROM for many exercises on L.  Most notedly, however, was his inability to use L LE effectively during ambulation.  He would lead with the L leg, clearly not put much weight on it and then swing R LE forward but still behind L a majority of the time (despite cuing).  Pt became very fatigue with the effort of ambulation (HR 120s, O2 mid 80s).     Follow Up Recommendations  SNF     Equipment Recommendations  Rolling walker with 5" wheels    Recommendations for Other Services       Precautions / Restrictions Precautions Precautions: Fall Restrictions Weight Bearing Restrictions: Yes LLE Weight Bearing: Weight bearing as tolerated    Mobility  Bed Mobility Overal bed mobility: Modified Independent Bed Mobility: Supine to Sit;Sit to Supine     Supine to sit: Min guard Sit to supine: Min guard   General bed mobility comments: Pt needing to use UEs to assist L LE out of and back into bed, but with extra time, effort and cuing he was able to achieve transitions w/o physical assist.    Transfers Overall transfer level: Needs assistance Equipment used: Rolling walker (2 wheeled) Transfers: Sit to/from Stand Sit to Stand: Min guard         General transfer comment: elevated bed height, pt needed reminders for U&LE set up but was actually able to get to standing w/o direct assist  Ambulation/Gait Ambulation/Gait assistance: Min assist Gait Distance (Feet): 30 Feet Assistive device: Rolling walker (2 wheeled)       General Gait Details: Pt hesitant to fully use L  LE and generally appeared not to put more than 50% PWBing through it despite cues to try and use it more.  He was unable to advance the R LE past the L and generally showed poor awareness when cued to do so.  He became very fatigued with the effort with HR into the 120s and O2 dropping to mid 80s with the effort.    Stairs             Wheelchair Mobility    Modified Rankin (Stroke Patients Only)       Balance Overall balance assessment: Modified Independent(needed assist to use walker appropriately and maintain stafe)                                          Cognition Arousal/Alertness: Awake/alert Behavior During Therapy: Impulsive;WFL for tasks assessed/performed Overall Cognitive Status: No family/caregiver present to determine baseline cognitive functioning                                 General Comments: Pt A&O x4; trouble following one step commands consisently; slow to complete Finger to nose test.      Exercises General Exercises - Lower Extremity Ankle Circles/Pumps: Strengthening;10 reps Quad Sets: Strengthening;10 reps Gluteal Sets: Strengthening;10  reps Short Arc Quad: AROM;15 reps Heel Slides: 10 reps;AROM(resisted leg extension) Hip ABduction/ADduction: AROM;10 reps Straight Leg Raises: AROM;AAROM;10 reps(AROM on R, AAROM on L with a lot of pain) Other Exercises Other Exercises: Pt educated in pursed lip breathing for pain mgt    General Comments        Pertinent Vitals/Pain Pain Assessment: 0-10 Pain Score: 5  Pain Location: L hip Pain Intervention(s): Limited activity within patient's tolerance;Monitored during session;Repositioned;Premedicated before session    Home Living Family/patient expects to be discharged to:: Skilled nursing facility Living Arrangements: Group Home                  Prior Function Level of Independence: Independent      Comments: Pt stated independent for all ADL tasks and  mobility   PT Goals (current goals can now be found in the care plan section) Acute Rehab PT Goals Patient Stated Goal: get stronger Progress towards PT goals: Progressing toward goals    Frequency    BID      PT Plan Current plan remains appropriate    Co-evaluation              AM-PAC PT "6 Clicks" Mobility   Outcome Measure  Help needed turning from your back to your side while in a flat bed without using bedrails?: A Little Help needed moving from lying on your back to sitting on the side of a flat bed without using bedrails?: A Little Help needed moving to and from a bed to a chair (including a wheelchair)?: A Little Help needed standing up from a chair using your arms (e.g., wheelchair or bedside chair)?: A Little Help needed to walk in hospital room?: A Lot Help needed climbing 3-5 steps with a railing? : Total 6 Click Score: 15    End of Session Equipment Utilized During Treatment: Gait belt Activity Tolerance: Patient limited by fatigue;Patient limited by pain Patient left: with bed alarm set;with call bell/phone within reach Nurse Communication: Mobility status PT Visit Diagnosis: Muscle weakness (generalized) (M62.81);Difficulty in walking, not elsewhere classified (R26.2)     Time: 3710-6269 PT Time Calculation (min) (ACUTE ONLY): 28 min  Charges:  $Gait Training: 8-22 mins $Therapeutic Exercise: 8-22 mins                     Kreg Shropshire, DPT 12/02/2017, 4:23 PM

## 2017-12-02 NOTE — Progress Notes (Signed)
PHARMACIST - PHYSICIAN COMMUNICATION  CONCERNING:  Enoxaparin (Lovenox) for DVT Prophylaxis    RECOMMENDATION: Patient was prescribed enoxaprin 40mg  q24 hours for VTE prophylaxis.   Filed Weights   11/30/17 1253  Weight: 138 lb 12.8 oz (63 kg)    Body mass index is 21.74 kg/m.  Estimated Creatinine Clearance: 19.8 mL/min (A) (by C-G formula based on SCr of 2.61 mg/dL (H)).  Patient is candidate for enoxaparin 30mg  every 24 hours based on CrCl <84ml/min   DESCRIPTION: Pharmacy has adjusted enoxaparin dose.  Patient is now receiving enoxaparin 30mg  every 24 hours.  Pernell Dupre, PharmD Clinical Pharmacist  12/02/2017 12:35 AM

## 2017-12-02 NOTE — Evaluation (Signed)
Physical Therapy Evaluation Patient Details Name: Justin Andrews MRN: [id number] DOB: [birth year] Today's Date: 12/02/2017   History of Present Illness  83 y/o male s/p fall with L hip fx, s/p IM nailing 11/25.  Clinical Impression  Pt showed good effort with PT exam post L hip IM nailing, but needed assist with most mobility and showed poor tolerance and safety with brief bout of ambulation.  He was able to participate fairly well with ~10 minutes of supine bed exercises, though pain and weakness were limiters with the L hip/leg.  Pt is not safe to return home and will require STR to get back to a functional level.      Follow Up Recommendations SNF    Equipment Recommendations  Rolling walker with 5" wheels    Recommendations for Other Services       Precautions / Restrictions Precautions Precautions: None Restrictions Weight Bearing Restrictions: Yes LLE Weight Bearing: Weight bearing as tolerated      Mobility  Bed Mobility Overal bed mobility: Needs Assistance Bed Mobility: Supine to Sit     Supine to sit: Min assist     General bed mobility comments: light assist to get LEs to EOB for transfer to sitting  Transfers Overall transfer level: Needs assistance Equipment used: Rolling walker (2 wheeled) Transfers: Sit to/from Stand Sit to Stand: Min assist         General transfer comment: Pt struggled to get weight forward enough to standing w/o some light assist to keep weight forward  Ambulation/Gait Ambulation/Gait assistance: Min assist Gait Distance (Feet): 6 Feet Assistive device: Rolling walker (2 wheeled)       General Gait Details: Pt with some buckling in L LE, c/o pain with the effort and is highly reliant on the walker.  Overall showed good effort but was able to manage only very modest distance.   Stairs            Wheelchair Mobility    Modified Rankin (Stroke Patients Only)       Balance Overall balance assessment: Modified  Independent                                           Pertinent Vitals/Pain Pain Assessment: 0-10 Pain Score: 8  Pain Location: L hip    Home Living Family/patient expects to be discharged to:: Skilled nursing facility Living Arrangements: Group Home                    Prior Function Level of Independence: Independent               Hand Dominance        Extremity/Trunk Assessment   Upper Extremity Assessment Upper Extremity Assessment: Overall WFL for tasks assessed    Lower Extremity Assessment Lower Extremity Assessment: Overall WFL for tasks assessed(except L hip pain limited, unable to lift against gravi)       Communication   Communication: No difficulties  Cognition Arousal/Alertness: Awake/alert Behavior During Therapy: Restless;WFL for tasks assessed/performed Overall Cognitive Status: History of cognitive impairments - at baseline                                        General Comments      Exercises General Exercises - Lower  Extremity Ankle Circles/Pumps: AROM;10 reps Quad Sets: Strengthening;10 reps Gluteal Sets: Strengthening;10 reps Short Arc Quad: AROM;10 reps(a lot of pain with getting to position) Heel Slides: 10 reps;AAROM Hip ABduction/ADduction: 10 reps;AAROM Straight Leg Raises: (unable)   Assessment/Plan    PT Assessment Patient needs continued PT services  PT Problem List         PT Treatment Interventions DME instruction;Gait training;Stair training;Functional mobility training;Therapeutic activities;Therapeutic exercise;Balance training;Neuromuscular re-education;Cognitive remediation;Patient/family education    PT Goals (Current goals can be found in the Care Plan section)  Acute Rehab PT Goals Patient Stated Goal: get stronger PT Goal Formulation: With patient Time For Goal Achievement: 12/16/17 Potential to Achieve Goals: Fair    Frequency BID   Barriers to discharge         Co-evaluation               AM-PAC PT "6 Clicks" Mobility  Outcome Measure Help needed turning from your back to your side while in a flat bed without using bedrails?: A Little Help needed moving from lying on your back to sitting on the side of a flat bed without using bedrails?: A Lot Help needed moving to and from a bed to a chair (including a wheelchair)?: A Little Help needed standing up from a chair using your arms (e.g., wheelchair or bedside chair)?: A Lot Help needed to walk in hospital room?: A Lot Help needed climbing 3-5 steps with a railing? : Total 6 Click Score: 13    End of Session Equipment Utilized During Treatment: Gait belt Activity Tolerance: Patient limited by pain Patient left: with chair alarm set;with call bell/phone within reach Nurse Communication: Mobility status PT Visit Diagnosis: Muscle weakness (generalized) (M62.81);Difficulty in walking, not elsewhere classified (R26.2)    Time: 5956-3875 PT Time Calculation (min) (ACUTE ONLY): 43 min   Charges:   PT Evaluation $PT Eval Low Complexity: 1 Low PT Treatments $Therapeutic Exercise: 8-22 mins        Kreg Shropshire, DPT 12/02/2017, 12:12 PM

## 2017-12-02 NOTE — Progress Notes (Signed)
Justin Andrews    MR#:  [id number]  DATE OF BIRTH:  June 10, 1934  SUBJECTIVE:  patient awake and alert. Did not realize he had already had a surgery. Feeling hungry. Denies any other complaints.  REVIEW OF SYSTEMS:   Review of Systems  Constitutional: Negative for chills, fever and weight loss.  HENT: Negative for ear discharge, ear pain and nosebleeds.   Eyes: Negative for blurred vision, pain and discharge.  Respiratory: Negative for sputum production, shortness of breath, wheezing and stridor.   Cardiovascular: Negative for chest pain, palpitations, orthopnea and PND.  Gastrointestinal: Negative for abdominal pain, diarrhea, nausea and vomiting.  Genitourinary: Negative for frequency and urgency.  Musculoskeletal: Positive for joint pain. Negative for back pain.  Neurological: Positive for weakness. Negative for sensory change, speech change and focal weakness.  Psychiatric/Behavioral: Negative for depression and hallucinations. The patient is not nervous/anxious.    Tolerating Diet:regular  Tolerating PT: peniding hip surgery  DRUG ALLERGIES:   Allergies  Allergen Reactions  . Aspirin Other (See Comments)    bleeding    VITALS:  Blood pressure 140/64, pulse 93, temperature 97.9 F (36.6 C), temperature source Oral, resp. rate 17, height 5\' 7"  (1.702 m), weight 63 kg, SpO2 95 %.  PHYSICAL EXAMINATION:   Physical Exam  GENERAL:  83 y.o.-year-old patient lying in the bed with no acute distress. Limited exam since patient is sedated  EYES: Pupils equal, round, reactive to light and accommodation. No scleral icterus. Extraocular muscles intact.  HEENT: Head atraumatic, normocephalic. Oropharynx and nasopharynx clear.  NECK:  Supple, no jugular venous distention. No thyroid enlargement, no tenderness.  LUNGS: Normal breath sounds bilaterally, no wheezing, rales, rhonchi. No use of accessory muscles of  respiration.  CARDIOVASCULAR: S1, S2 normal. No murmurs, rubs, or gallops.  ABDOMEN: Soft, nontender, nondistended. Bowel sounds present. No organomegaly or mass.  EXTREMITIES: No cyanosis, clubbing or edema b/l.    NEUROLOGIC: unable to perform patient sedated  PSYCHIATRIC:  patient is sedated from the Ativan received earlier SKIN: No obvious rash, lesion, or ulcer.   LABORATORY PANEL:  CBC Recent Labs  Lab 12/02/17 0445  WBC 14.5*  HGB 9.6*  HCT 30.7*  PLT 352    Chemistries  Recent Labs  Lab 11/30/17 1251 11/30/17 1252  12/02/17 0445  NA 142  --    < > 144  K 3.7  --    < > 3.5  CL 110  --    < > 116*  CO2 19*  --    < > 23  GLUCOSE 124*  --    < > 129*  BUN 30*  --    < > 23  CREATININE 3.70*  --    < > 2.31*  CALCIUM 8.9  --    < > 7.8*  MG  --  2.1  --   --   AST 32  --   --   --   ALT 18  --   --   --   ALKPHOS 62  --   --   --   BILITOT 0.7  --   --   --    < > = values in this interval not displayed.   Cardiac Enzymes No results for input(s): TROPONINI in the last 168 hours. RADIOLOGY:  Ct Head Wo Contrast  Result Date: 11/30/2017 CLINICAL DATA:  Minor head trauma EXAM: CT HEAD WITHOUT CONTRAST CT  CERVICAL SPINE WITHOUT CONTRAST TECHNIQUE: Multidetector CT imaging of the head and cervical spine was performed following the standard protocol without intravenous contrast. Multiplanar CT image reconstructions of the cervical spine were also generated. COMPARISON:  11/12/2009 head CT FINDINGS: CT HEAD FINDINGS Brain: No evidence of acute infarction, hemorrhage, hydrocephalus, extra-axial collection or mass lesion/mass effect. Moderate low-density in the cerebral white matter attributed to chronic small vessel ischemia. Cerebral volume loss that is generalized Vascular: Atherosclerotic calcification Skull: Negative for fracture. Left mastoidectomy with chronically opacified bowl. The middle ear is also opacified today, progressed from 2011. Orbits: No evidence of  injury CT CERVICAL SPINE FINDINGS Alignment: No traumatic malalignment. Skull base and vertebrae: Negative for fracture Soft tissues and spinal canal: No prevertebral fluid or swelling. No visible canal hematoma. Disc levels: Diffuse degenerative disc narrowing and ridging with foraminal stenosis. Upper chest: Emphysema IMPRESSION: 1. No evidence of acute intracranial or cervical spine injury. 2. Chronic findings are described above. Electronically Signed   By: Monte Fantasia M.D.   On: 11/30/2017 13:48   Ct Cervical Spine Wo Contrast  Result Date: 11/30/2017 CLINICAL DATA:  Minor head trauma EXAM: CT HEAD WITHOUT CONTRAST CT CERVICAL SPINE WITHOUT CONTRAST TECHNIQUE: Multidetector CT imaging of the head and cervical spine was performed following the standard protocol without intravenous contrast. Multiplanar CT image reconstructions of the cervical spine were also generated. COMPARISON:  11/12/2009 head CT FINDINGS: CT HEAD FINDINGS Brain: No evidence of acute infarction, hemorrhage, hydrocephalus, extra-axial collection or mass lesion/mass effect. Moderate low-density in the cerebral white matter attributed to chronic small vessel ischemia. Cerebral volume loss that is generalized Vascular: Atherosclerotic calcification Skull: Negative for fracture. Left mastoidectomy with chronically opacified bowl. The middle ear is also opacified today, progressed from 2011. Orbits: No evidence of injury CT CERVICAL SPINE FINDINGS Alignment: No traumatic malalignment. Skull base and vertebrae: Negative for fracture Soft tissues and spinal canal: No prevertebral fluid or swelling. No visible canal hematoma. Disc levels: Diffuse degenerative disc narrowing and ridging with foraminal stenosis. Upper chest: Emphysema IMPRESSION: 1. No evidence of acute intracranial or cervical spine injury. 2. Chronic findings are described above. Electronically Signed   By: Monte Fantasia M.D.   On: 11/30/2017 13:48   Ct Abdomen Pelvis W  Contrast  Result Date: 11/30/2017 CLINICAL DATA:  Pain after fall. EXAM: CT ABDOMEN AND PELVIS WITH CONTRAST TECHNIQUE: Multidetector CT imaging of the abdomen and pelvis was performed using the standard protocol following bolus administration of intravenous contrast. CONTRAST:  37mL OMNIPAQUE IOHEXOL 300 MG/ML  SOLN COMPARISON:  November 21, 2016 FINDINGS: Lower chest: No acute abnormality. Hepatobiliary: No focal liver abnormality is seen. No gallstones, gallbladder wall thickening, or biliary dilatation. Pancreas: Fatty deposition in the pancreatic head, neck, and uncinate process. No acute pancreatic abnormality noted. Spleen: Normal in size without focal abnormality. Adrenals/Urinary Tract: Adrenal glands are normal. Multiple renal masses identified in the left kidney, consistent with cysts. No suspicious masses. No stones or hydronephrosis. No perinephric stranding. The ureters and bladder are normal. Stomach/Bowel: The stomach is normal. A duodenal diverticulum is much smaller in the interval with no evidence of active inflammation. Small bowel otherwise normal. The colon and appendix are normal. Vascular/Lymphatic: Atherosclerotic changes are seen in the nonaneurysmal aorta. No adenopathy. Reproductive: Prostate is unremarkable. Other: No abdominal wall hernia or abnormality. No abdominopelvic ascites. Musculoskeletal: There is a comminuted fracture of the proximal left femur, extending through the intertrochanteric region with angulation. No dislocation. No other fractures. IMPRESSION: 1. Comminuted fracture through  the intertrochanteric region of the left femur. No dislocation. 2. Renal cysts. 3. Atherosclerotic changes in the abdominal aorta. 4. No other acute abnormalities. Electronically Signed   By: Dorise Bullion III M.D   On: 11/30/2017 13:58   Dg Chest Portable 1 View  Result Date: 11/30/2017 CLINICAL DATA:  Pain after fall EXAM: PORTABLE CHEST 1 VIEW COMPARISON:  November 19, 2017  FINDINGS: The heart size and mediastinal contours are within normal limits. Both lungs are clear. The visualized skeletal structures are unremarkable. IMPRESSION: No active disease. Electronically Signed   By: Dorise Bullion III M.D   On: 11/30/2017 14:01   Dg Hip Operative Unilat W Or W/o Pelvis Left  Result Date: 12/01/2017 CLINICAL DATA:  Intraoperative localization for ORIF of left hip fracture. EXAM: LEFT FEMUR PORTABLE 2 VIEWS; OPERATIVE LEFT HIP WITH PELVIS COMPARISON:  Prior radiograph from 11/30/2017 FINDINGS: Postoperative changes from interval ORIF of previously identified left hip fracture. Interval placement of IM nail with distal interlocking screws. Hardware appears well positioned without complication. Left hip fracture in gross anatomic alignment. No adverse features. Postoperative swelling and emphysema about the left hip and knee. Visualized bony pelvis intact. Vascular calcifications noted within the thigh. IMPRESSION: Sequelae of interval ORIF for proximal left femoral fracture without complication. Electronically Signed   By: Jeannine Boga M.D.   On: 12/01/2017 20:42   Dg Hip Unilat W Or Wo Pelvis 2-3 Views Left  Result Date: 11/30/2017 CLINICAL DATA:  Pain after fall EXAM: DG HIP (WITH OR WITHOUT PELVIS) 2-3V LEFT COMPARISON:  None. FINDINGS: There is a comminuted fracture through the left femoral neck, extending through the intertrochanteric region with angulation. No dislocation. 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 No other fractures identified. IMPRESSION: Left proximal femoral fracture as above. Electronically Signed   By: Dorise Bullion III M.D   On: 11/30/2017 14:00   Dg Femur Port Min 2 Views Left  Result Date: 12/01/2017 CLINICAL DATA:  Intraoperative localization for ORIF of left hip fracture. EXAM: LEFT FEMUR PORTABLE 2 VIEWS; OPERATIVE LEFT HIP WITH PELVIS COMPARISON:  Prior radiograph from 11/30/2017 FINDINGS: Postoperative changes from interval ORIF of  previously identified left hip fracture. Interval placement of IM nail with distal interlocking screws. Hardware appears well positioned without complication. Left hip fracture in gross anatomic alignment. No adverse features. Postoperative swelling and emphysema about the left hip and knee. Visualized bony pelvis intact. Vascular calcifications noted within the thigh. IMPRESSION: Sequelae of interval ORIF for proximal left femoral fracture without complication. Electronically Signed   By: Jeannine Boga M.D.   On: 12/01/2017 20:42   ASSESSMENT AND PLAN:  Justin Andrews  is a 83 y.o. male with a known history of CKD, melanoma, hypertension, osteoarthritis and tobacco abuse.  According to RN, the patient is drunk and is not a good historian.  It is reported that the patient fell down 6 steps at home.  Reportedly there was alcohol involved.  * acute Left hip fracture status post mechanical fall. Patient has history of alcohol abuse. Apparently there was alcohol involved. His serum alcohol level is less than 10. -on CIWA protocol -Pain control, orthopedic surgery consult with Dr. Earmon Phoenix appreciated. -pt is s/p POd #1 - PT and OT evaluation today  *Acute renal failure on CKD stage III with acidosis -Baseline creatinine 2.0 -came in with creatinine of 3.7-- IV fluids--2.3 (baseline) -resume lisinopril -d/c Ivf  *Hypokalemia pharmacy to replace electrolytes  *Leukocytosis.  Possible due to reaction.   -negative for UTI -  urine drug screen unable to report due to some interfering substance  *Hypertension.   -resumed bp meds  *Anemia of chronic disease, stable.  *Alcohol use. Cont CIWA protocol.  No family in the room. cSW for d/c planning  Case discussed with Care Management/Social Worker. Management plans discussed with the patient, family and they are in agreement.  CODE STATUS: full  DVT Prophylaxis: pending hip surgery continue SCD  TOTAL TIME TAKING CARE OF THIS PATIENT:  *25* minutes.  >50% time spent on counselling and coordination of care  POSSIBLE D/C IN few DAYS, DEPENDING ON CLINICAL CONDITION.  Note: This dictation was prepared with Dragon dictation along with smaller phrase technology. Any transcriptional errors that result from this process are unintentional.  Fritzi Mandes M.D on 12/02/2017 at 8:55 AM  Between 7am to 6pm - Pager - [phone]  After 6pm go to [email or web address] - password [email or web address] [facility]  Office  [phone]  CC: Primary care physician; Leone Haven, MDPatient ID: Justin Andrews, male   DOB: 1934/06/09, 83 y.o.   MRN: [id number]

## 2017-12-03 DIAGNOSIS — Z23 Encounter for immunization: Secondary | ICD-10-CM | POA: Diagnosis not present

## 2017-12-03 DIAGNOSIS — R69 Illness, unspecified: Secondary | ICD-10-CM | POA: Diagnosis not present

## 2017-12-03 DIAGNOSIS — N419 Inflammatory disease of prostate, unspecified: Secondary | ICD-10-CM | POA: Diagnosis not present

## 2017-12-03 DIAGNOSIS — E569 Vitamin deficiency, unspecified: Secondary | ICD-10-CM | POA: Diagnosis not present

## 2017-12-03 DIAGNOSIS — Z743 Need for continuous supervision: Secondary | ICD-10-CM | POA: Diagnosis not present

## 2017-12-03 DIAGNOSIS — Y900 Blood alcohol level of less than 20 mg/100 ml: Secondary | ICD-10-CM | POA: Diagnosis not present

## 2017-12-03 DIAGNOSIS — N183 Chronic kidney disease, stage 3 (moderate): Secondary | ICD-10-CM | POA: Diagnosis not present

## 2017-12-03 DIAGNOSIS — K219 Gastro-esophageal reflux disease without esophagitis: Secondary | ICD-10-CM | POA: Diagnosis not present

## 2017-12-03 DIAGNOSIS — N179 Acute kidney failure, unspecified: Secondary | ICD-10-CM | POA: Diagnosis not present

## 2017-12-03 DIAGNOSIS — R52 Pain, unspecified: Secondary | ICD-10-CM | POA: Diagnosis not present

## 2017-12-03 DIAGNOSIS — S72002A Fracture of unspecified part of neck of left femur, initial encounter for closed fracture: Secondary | ICD-10-CM | POA: Diagnosis not present

## 2017-12-03 DIAGNOSIS — M6281 Muscle weakness (generalized): Secondary | ICD-10-CM | POA: Diagnosis not present

## 2017-12-03 DIAGNOSIS — Y92009 Unspecified place in unspecified non-institutional (private) residence as the place of occurrence of the external cause: Secondary | ICD-10-CM | POA: Diagnosis not present

## 2017-12-03 DIAGNOSIS — S72002D Fracture of unspecified part of neck of left femur, subsequent encounter for closed fracture with routine healing: Secondary | ICD-10-CM | POA: Diagnosis not present

## 2017-12-03 DIAGNOSIS — M25552 Pain in left hip: Secondary | ICD-10-CM | POA: Diagnosis not present

## 2017-12-03 DIAGNOSIS — E43 Unspecified severe protein-calorie malnutrition: Secondary | ICD-10-CM

## 2017-12-03 DIAGNOSIS — I10 Essential (primary) hypertension: Secondary | ICD-10-CM | POA: Diagnosis not present

## 2017-12-03 DIAGNOSIS — D649 Anemia, unspecified: Secondary | ICD-10-CM | POA: Diagnosis not present

## 2017-12-03 DIAGNOSIS — W109XXA Fall (on) (from) unspecified stairs and steps, initial encounter: Secondary | ICD-10-CM | POA: Diagnosis not present

## 2017-12-03 DIAGNOSIS — D509 Iron deficiency anemia, unspecified: Secondary | ICD-10-CM | POA: Diagnosis not present

## 2017-12-03 DIAGNOSIS — N401 Enlarged prostate with lower urinary tract symptoms: Secondary | ICD-10-CM | POA: Diagnosis not present

## 2017-12-03 LAB — BASIC METABOLIC PANEL
ANION GAP: 7 (ref 5–15)
BUN: 25 mg/dL — ABNORMAL HIGH (ref 8–23)
CHLORIDE: 113 mmol/L — AB (ref 98–111)
CO2: 22 mmol/L (ref 22–32)
Calcium: 8.1 mg/dL — ABNORMAL LOW (ref 8.9–10.3)
Creatinine, Ser: 1.93 mg/dL — ABNORMAL HIGH (ref 0.61–1.24)
GFR calc Af Amer: 37 mL/min — ABNORMAL LOW (ref >60)
GFR calc non Af Amer: 32 mL/min — ABNORMAL LOW (ref >60)
GLUCOSE: 116 mg/dL — AB (ref 70–99)
POTASSIUM: 3.4 mmol/L — AB (ref 3.5–5.1)
Sodium: 142 mmol/L (ref 135–145)

## 2017-12-03 LAB — CBC
HEMATOCRIT: 29.7 % — AB (ref 39.0–52.0)
HEMOGLOBIN: 9.6 g/dL — AB (ref 13.0–17.0)
MCH: 32.2 pg (ref 26.0–34.0)
MCHC: 32.3 g/dL (ref 30.0–36.0)
MCV: 99.7 fL (ref 80.0–100.0)
NRBC: 0 % (ref 0.0–0.2)
Platelets: 346 10*3/uL (ref 150–400)
RBC: 2.98 MIL/uL — ABNORMAL LOW (ref 4.22–5.81)
RDW: 16.1 % — AB (ref 11.5–15.5)
WBC: 14.7 10*3/uL — ABNORMAL HIGH (ref 4.0–10.5)

## 2017-12-03 MED ORDER — THIAMINE HCL 100 MG PO TABS: 100.0000 mg | ORAL_TABLET | Freq: Every day | ORAL | 0 refills | Status: DC

## 2017-12-03 MED ORDER — ASCORBIC ACID 250 MG PO TABS: 250.0000 mg | ORAL_TABLET | Freq: Two times a day (BID) | ORAL | 0 refills | Status: DC

## 2017-12-03 MED ORDER — LORAZEPAM 2 MG/ML IJ SOLN: 0.5000 mg | Freq: Three times a day (TID) | INTRAMUSCULAR | Status: DC | PRN

## 2017-12-03 MED ORDER — TRAMADOL HCL 50 MG PO TABS: 50.0000 mg | ORAL_TABLET | Freq: Four times a day (QID) | ORAL | 0 refills | Status: DC | PRN

## 2017-12-03 MED ORDER — GABAPENTIN 100 MG PO CAPS: 200.0000 mg | ORAL_CAPSULE | Freq: Three times a day (TID) | ORAL | 0 refills | Status: DC

## 2017-12-03 MED ORDER — ENSURE ENLIVE PO LIQD: 237.0000 mL | Freq: Two times a day (BID) | ORAL | 12 refills | Status: DC

## 2017-12-03 MED ORDER — OXYCODONE HCL 5 MG PO TABS: 5.0000 mg | ORAL_TABLET | ORAL | 0 refills | Status: DC | PRN

## 2017-12-03 MED ORDER — CARVEDILOL 6.25 MG PO TABS: 12.5000 mg | ORAL_TABLET | Freq: Two times a day (BID) | ORAL | 5 refills | Status: DC

## 2017-12-03 MED ORDER — CALCIUM CARBONATE-VITAMIN D 500-200 MG-UNIT PO TABS: 1.0000 | ORAL_TABLET | Freq: Two times a day (BID) | ORAL | 1 refills | Status: DC

## 2017-12-03 MED ORDER — ENOXAPARIN SODIUM 30 MG/0.3ML ~~LOC~~ SOLN: 30.0000 mg | SUBCUTANEOUS | 0 refills | Status: DC

## 2017-12-03 MED ORDER — ADULT MULTIVITAMIN W/MINERALS CH: 1.0000 | ORAL_TABLET | Freq: Every day | ORAL | 0 refills | Status: DC

## 2017-12-03 NOTE — Progress Notes (Addendum)
[facility] NAME: Justin Andrews    MR#:  [id number]  DATE OF BIRTH:  [birth year]  SUBJECTIVE:  patient awake and alert.] Denies any other complaints other than pain at hip site  REVIEW OF SYSTEMS:   Review of Systems  Constitutional: Negative for chills, fever and weight loss.  HENT: Negative for ear discharge, ear pain and nosebleeds.   Eyes: Negative for blurred vision, pain and discharge.  Respiratory: Negative for sputum production, shortness of breath, wheezing and stridor.   Cardiovascular: Negative for chest pain, palpitations, orthopnea and PND.  Gastrointestinal: Negative for abdominal pain, diarrhea, nausea and vomiting.  Genitourinary: Negative for frequency and urgency.  Musculoskeletal: Positive for joint pain. Negative for back pain.  Neurological: Positive for weakness. Negative for sensory change, speech change and focal weakness.  Psychiatric/Behavioral: Negative for depression and hallucinations. The patient is not nervous/anxious.    Tolerating Diet:regular  Tolerating PT: SNF  DRUG ALLERGIES:   Allergies  Allergen Reactions  . Aspirin Other (See Comments)    bleeding    VITALS:  Blood pressure 129/69, pulse 86, temperature 98.6 F (37 C), temperature source Oral, resp. rate 19, height 5\' 7"  (1.702 m), weight 63 kg, SpO2 95 %.  PHYSICAL EXAMINATION:   Physical Exam  GENERAL:  83 y.o.-year-old patient lying in the bed with no acute distress. Limited exam since patient is sedated  EYES: Pupils equal, round, reactive to light and accommodation. No scleral icterus. Extraocular muscles intact.  HEENT: Head atraumatic, normocephalic. Oropharynx and nasopharynx clear.  NECK:  Supple, no jugular venous distention. No thyroid enlargement, no tenderness.  LUNGS: Normal breath sounds bilaterally, no wheezing, rales, rhonchi. No use of accessory muscles of respiration.  CARDIOVASCULAR: S1, S2 normal. No  murmurs, rubs, or gallops.  ABDOMEN: Soft, nontender, nondistended. Bowel sounds present. No organomegaly or mass.  EXTREMITIES: No cyanosis, clubbing or edema b/l.    NEUROLOGIC: Grossly non focal PSYCHIATRIC:  patient is awake and alert SKIN: No obvious rash, lesion, or ulcer.   LABORATORY PANEL:  CBC Recent Labs  Lab 12/03/17 0421  WBC 14.7*  HGB 9.6*  HCT 29.7*  PLT 346    Chemistries  Recent Labs  Lab 11/30/17 1251 11/30/17 1252  12/03/17 0421  NA 142  --    < > 142  K 3.7  --    < > 3.4*  CL 110  --    < > 113*  CO2 19*  --    < > 22  GLUCOSE 124*  --    < > 116*  BUN 30*  --    < > 25*  CREATININE 3.70*  --    < > 1.93*  CALCIUM 8.9  --    < > 8.1*  MG  --  2.1  --   --   AST 32  --   --   --   ALT 18  --   --   --   ALKPHOS 62  --   --   --   BILITOT 0.7  --   --   --    < > = values in this interval not displayed.   Cardiac Enzymes No results for input(s): TROPONINI in the last 168 hours. RADIOLOGY:  Dg Hip Operative Unilat W Or W/o Pelvis Left  Result Date: 12/01/2017 CLINICAL DATA:  Intraoperative localization for ORIF of left hip fracture. EXAM: LEFT FEMUR PORTABLE 2 VIEWS; OPERATIVE LEFT HIP  WITH PELVIS COMPARISON:  Prior radiograph from 11/30/2017 FINDINGS: Postoperative changes from interval ORIF of previously identified left hip fracture. Interval placement of IM nail with distal interlocking screws. Hardware appears well positioned without complication. Left hip fracture in gross anatomic alignment. No adverse features. Postoperative swelling and emphysema about the left hip and knee. Visualized bony pelvis intact. Vascular calcifications noted within the thigh. IMPRESSION: Sequelae of interval ORIF for proximal left femoral fracture without complication. Electronically Signed   By: Jeannine Boga M.D.   On: 12/01/2017 20:42   Dg Femur Port Min 2 Views Left  Result Date: 12/01/2017 CLINICAL DATA:  Intraoperative localization for ORIF of left  hip fracture. EXAM: LEFT FEMUR PORTABLE 2 VIEWS; OPERATIVE LEFT HIP WITH PELVIS COMPARISON:  Prior radiograph from 11/30/2017 FINDINGS: Postoperative changes from interval ORIF of previously identified left hip fracture. Interval placement of IM nail with distal interlocking screws. Hardware appears well positioned without complication. Left hip fracture in gross anatomic alignment. No adverse features. Postoperative swelling and emphysema about the left hip and knee. Visualized bony pelvis intact. Vascular calcifications noted within the thigh. IMPRESSION: Sequelae of interval ORIF for proximal left femoral fracture without complication. Electronically Signed   By: Jeannine Boga M.D.   On: 12/01/2017 20:42   ASSESSMENT AND PLAN:  Justin Andrews  is a 83 y.o. male with a known history of CKD, melanoma, hypertension, osteoarthritis and tobacco abuse.  According to RN, the patient is drunk and is not a good historian.  It is reported that the patient fell down 6 steps at home.  Reportedly there was alcohol involved.  * acute Left femoral fracture status post mechanical fall. Patient has history of alcohol abuse. Apparently there was alcohol involved. His serum alcohol level is less than 10. -on CIWA protocol -Pain control, orthopedic surgery consult with Dr. Earmon Phoenix appreciated. -pt is s/p POD # 2 ORIF - PT evaluation noted  *Acute renal failure on CKD stage III with acidosis -Baseline creatinine 2.0 -came in with creatinine of 3.7-- IV fluids--2.3 (baseline)--1.93 -resume lisinopril -d/c Ivf  *Hypokalemia pharmacy to replace electrolytes  *Leukocytosis.  Possible due to reaction.   -negative for UTI -urine drug screen unable to report due to some interfering substance  *Hypertension.   -resumed bp meds  *Anemia of chronic disease, stable.  *h/o of Alcohol use. -CIWA scoring zero  No family in the room. cSW for d/c planning--- pt is ok to go to rehab. Improving slowly  Case  discussed with Care Management/Social Worker. Management plans discussed with the patient, family and they are in agreement.  CODE STATUS: full  DVT Prophylaxis: pending hip surgery continue SCD  TOTAL TIME TAKING CARE OF THIS PATIENT: *25* minutes.  >50% time spent on counselling and coordination of care  POSSIBLE D/C IN few DAYS, DEPENDING ON CLINICAL CONDITION.  Note: This dictation was prepared with Dragon dictation along with smaller phrase technology. Any transcriptional errors that result from this process are unintentional.  Fritzi Mandes M.D on 12/03/2017 at 7:37 AM  Between 7am to 6pm - Pager - [phone]  After 6pm go to [email or web address] - password [email or web address] [facility]  Office  [phone]  CC: Primary care physician; Leone Haven, MDPatient ID: Justin Andrews, male   DOB: [birth year], 83 y.o.   MRN: [id number]

## 2017-12-03 NOTE — Progress Notes (Signed)
Physical Therapy Treatment Patient Details Name: Justin Andrews MRN: [id number] DOB: Mar 08, 1934 Today's Date: 12/03/2017    History of Present Illness 83 y/o male s/p fall with L hip fx, s/p IM nailing 11/25.    PT Comments    Justin Andrews participated in therapeutic exercise in supine with HR rising to 112 but improving prior to supine>sit.  Provided min guard assist for supine>sit and sit>stand due to unsteadiness.  Pt instructed to sit back down for pericare; however, after stand>sit pt reports 10/10 dizziness sitting EOB with HR fluctuating 72>103 and not improving with rest sitting EOB for several minutes.  Pt assisted back to supine with HR improving to high 90s with HR remaining steady and dizziness improving at end of session. RN notified of concern for new a-fib and recommendation for cardiology consult.  SNF remains most appropriate d/c plan at this time.    Follow Up Recommendations  SNF     Equipment Recommendations  Rolling walker with 5" wheels    Recommendations for Other Services Other (comment)(Cardiology consult)     Precautions / Restrictions Precautions Precautions: Fall;Other (comment) Precaution Comments: Monitor SpO2 and HR Restrictions Weight Bearing Restrictions: Yes LLE Weight Bearing: Weight bearing as tolerated    Mobility  Bed Mobility Overal bed mobility: Needs Assistance Bed Mobility: Supine to Sit;Sit to Supine     Supine to sit: Min guard;HOB elevated Sit to supine: Mod assist   General bed mobility comments: Pt using bed rail to assist in pulling to sitting.  Min guard provided for safety with supine>sit.  Increased time and effort.  Mod assist to return to supine for bringing BLEs back into bed.   Transfers Overall transfer level: Needs assistance Equipment used: Rolling walker (2 wheeled) Transfers: Sit to/from Stand Sit to Stand: Min guard         General transfer comment: Cues for hand placement.  Pt with mild unsteadiness but no  LOB.    Ambulation/Gait             General Gait Details: Deferred this visit due to fluctuating HR   Stairs             Wheelchair Mobility    Modified Rankin (Stroke Patients Only)       Balance Overall balance assessment: Needs assistance Sitting-balance support: Single extremity supported;Feet supported Sitting balance-Leahy Scale: Poor Sitting balance - Comments: Required 1UE support sitting EOB due to dizziness this session after stand>sit   Standing balance support: Bilateral upper extremity supported;During functional activity Standing balance-Leahy Scale: Poor Standing balance comment: Pt reiles on UE support for static standing activity                            Cognition Arousal/Alertness: Awake/alert Behavior During Therapy: WFL for tasks assessed/performed Overall Cognitive Status: No family/caregiver present to determine baseline cognitive functioning                                        Exercises General Exercises - Lower Extremity Ankle Circles/Pumps: AROM;10 reps;Supine;Both Heel Slides: AROM;Strengthening;Left;10 reps;Supine Hip ABduction/ADduction: AAROM;Left;10 reps;Supine Straight Leg Raises: AAROM;Left;10 reps;Supine    General Comments General comments (skin integrity, edema, etc.): Vitlas monitored during session.  Supine after therapeutic exercise: HR 111, SpO2 93%.  After stand>sit pt reports 10/10 dizziness sitting EOB with HR fluctuating 72>103 and not improving with  rest sitting EOB for several minutes.  Pt assisted back to supine with HR improving to high 90s with HR remaining steady and dizziness improving at end of session. RN notified of concern for new a-fib and recommendation for cardiology consult.      Pertinent Vitals/Pain Pain Assessment: 0-10 Pain Score: 9  Pain Location: L hip Pain Descriptors / Indicators: Aching Pain Intervention(s): Limited activity within patient's  tolerance;Monitored during session;Utilized relaxation techniques    Home Living                      Prior Function            PT Goals (current goals can now be found in the care plan section) Acute Rehab PT Goals PT Goal Formulation: With patient Time For Goal Achievement: 12/16/17 Potential to Achieve Goals: Fair Progress towards PT goals: Not progressing toward goals - comment(due to HR)    Frequency    BID      PT Plan Current plan remains appropriate    Co-evaluation              AM-PAC PT "6 Clicks" Mobility   Outcome Measure  Help needed turning from your back to your side while in a flat bed without using bedrails?: A Little Help needed moving from lying on your back to sitting on the side of a flat bed without using bedrails?: A Little Help needed moving to and from a bed to a chair (including a wheelchair)?: A Little Help needed standing up from a chair using your arms (e.g., wheelchair or bedside chair)?: A Little Help needed to walk in hospital room?: A Lot Help needed climbing 3-5 steps with a railing? : Total 6 Click Score: 15    End of Session Equipment Utilized During Treatment: Gait belt Activity Tolerance: Treatment limited secondary to medical complications (Comment)(HR fluctuating with 10/10 dizziness) Patient left: with bed alarm set;with call bell/phone within reach;in bed Nurse Communication: Mobility status;Other (comment)(HR fluctuation and recommendation for cardiology consult) PT Visit Diagnosis: Muscle weakness (generalized) (M62.81);Difficulty in walking, not elsewhere classified (R26.2)     Time: 5784-6962 PT Time Calculation (min) (ACUTE ONLY): 32 min  Charges:  $Therapeutic Exercise: 8-22 mins $Therapeutic Activity: 8-22 mins                     Session was performed by student PT, Belva Crome, and directed, overseen, and documented by this PT.  Collie Siad PT, DPT 12/03/2017, 10:37 AM

## 2017-12-03 NOTE — Discharge Summary (Signed)
Justin Andrews at [facility] NAME: Justin Andrews    MR#:  [id number]  DATE OF BIRTH:  April 23, 1934  DATE OF ADMISSION:  11/30/2017 ADMITTING PHYSICIAN: Demetrios Loll, MD  DATE OF DISCHARGE: 11/27 2019  PRIMARY CARE PHYSICIAN: Leone Haven, MD    ADMISSION DIAGNOSIS:  Cough [R05] Elevated CK [R74.8] AKI (acute kidney injury) ([facility]) [N17.9] Closed fracture of neck of left femur, initial encounter ([facility]) [S72.002A]  DISCHARGE DIAGNOSIS:  Closed left femoral neck fracture s/p surgery POD #2  SECONDARY DIAGNOSIS:   Past Medical History:  Diagnosis Date  . Elevated serum creatinine   . History of melanoma   . Hypertension   . Osteoarthritis   . Tobacco abuse     HOSPITAL COURSE:   Justin Andrews an 83 y.o.malewith a known history of CKD, melanoma, hypertension, osteoarthritis and tobacco abuse. According to RN, the patient is drunk and is not a good historian. It is reported that the patient fell down 6 steps at home. Reportedly there was alcohol involved.  * acute Left femoral fracture status post mechanical fall. Patient has history of alcohol abuse. Apparently there was alcohol involved. His serum alcohol level is less than 10. -on CIWA protocol--scoring 0 -Pain control, orthopedic surgery consult with Dr. Earmon Phoenix appreciated--lovenox for 4 weeks -pt is s/p POD # 2 ORIF -PT evaluation noted--rehab  *Acute renal failure on CKD stage III with acidosis -Baseline creatinine 2.0 -came in with creatinine of 3.7-- IV fluids--2.3 (baseline)--1.93 -resume lisinopril -d/c Ivf  *Hypokalemia pharmacy to replace electrolytes  *Leukocytosis. Possible due to reaction.  -negative for UTI -urine drug screen unable to report due to some interfering substance  *Hypertension.  -resumed bp meds -increased coreg to 12.5 mg bid due to mild tachy  *Anemia of chronic disease, stable.  *h/o of Alcohol use. -CIWA scoring  zero  No family in the room. cSW for d/c planning--- pt is ok to go to rehab. Improving slowly  D/c to rehab. Insurance auth obtained  CONSULTS OBTAINED:  Treatment Team:  Thornton Park, MD  DRUG ALLERGIES:   Allergies  Allergen Reactions  . Aspirin Other (See Comments)    bleeding    DISCHARGE MEDICATIONS:   Allergies as of 12/03/2017      Reactions   Aspirin Other (See Comments)   bleeding      Medication List    STOP taking these medications   lisinopril 20 MG tablet Commonly known as:  PRINIVIL,ZESTRIL     TAKE these medications   amLODipine 10 MG tablet Commonly known as:  NORVASC Take 1 tablet (10 mg total) by mouth daily.   ascorbic acid 250 MG tablet Commonly known as:  VITAMIN C Take 1 tablet (250 mg total) by mouth 2 (two) times daily.   calcium-vitamin D 500-200 MG-UNIT tablet Commonly known as:  OSCAL WITH D Take 1 tablet by mouth 2 (two) times daily.   carvedilol 6.25 MG tablet Commonly known as:  COREG Take 2 tablets (12.5 mg total) by mouth 2 (two) times daily with a meal. What changed:  how much to take   [employer] VITAMIN B12 1000 MCG tablet Generic drug:  cyanocobalamin TAKE 1 TABLET BY MOUTH EVERY DAY What changed:  how much to take   enoxaparin 30 MG/0.3ML injection Commonly known as:  LOVENOX Inject 0.3 mLs (30 mg total) into the skin daily.   feeding supplement (ENSURE ENLIVE) Liqd Take 237 mLs by mouth 2 (two) times daily between meals.  ferrous sulfate 325 (65 FE) MG tablet Take 1 tablet (325 mg total) by mouth 2 (two) times daily.   folic acid 1 MG tablet Commonly known as:  FOLVITE TAKE 1 TABLET BY MOUTH EVERY DAY   gabapentin 100 MG capsule Commonly known as:  NEURONTIN Take 2 capsules (200 mg total) by mouth 3 (three) times daily.   multivitamin with minerals Tabs tablet Take 1 tablet by mouth daily.   oxyCODONE 5 MG immediate release tablet Commonly known as:  Oxy IR/ROXICODONE Take 1 tablet (5 mg total) by  mouth every 4 (four) hours as needed for severe pain (pain score 4-6).   ranitidine 150 MG tablet Commonly known as:  ZANTAC Take 150 mg by mouth 2 (two) times daily.   sertraline 100 MG tablet Commonly known as:  ZOLOFT TAKE 1.5 TABLETS (150 MG TOTAL) BY MOUTH DAILY.   tamsulosin 0.4 MG Caps capsule Commonly known as:  FLOMAX Take 1 capsule (0.4 mg total) by mouth daily.   thiamine 100 MG tablet Take 1 tablet (100 mg total) by mouth daily.   traMADol 50 MG tablet Commonly known as:  ULTRAM Take 1 tablet (50 mg total) by mouth every 6 (six) hours as needed for moderate pain. What changed:    how much to take  how to take this  when to take this  reasons to take this  additional instructions   traZODone 50 MG tablet Commonly known as:  DESYREL Take 0.5 tablets (25 mg total) by mouth at bedtime as needed for sleep.       If you experience worsening of your admission symptoms, develop shortness of breath, life threatening emergency, suicidal or homicidal thoughts you must seek medical attention immediately by calling 911 or calling your MD immediately  if symptoms less severe.  You Must read complete instructions/literature along with all the possible adverse reactions/side effects for all the Medicines you take and that have been prescribed to you. Take any new Medicines after you have completely understood and accept all the possible adverse reactions/side effects.   Please note  You were cared for by a hospitalist during your hospital stay. If you have any questions about your discharge medications or the care you received while you were in the hospital after you are discharged, you can call the unit and asked to speak with the hospitalist on call if the hospitalist that took care of you is not available. Once you are discharged, your primary care physician will handle any further medical issues. Please note that NO REFILLS for any discharge medications will be authorized  once you are discharged, as it is imperative that you return to your primary care physician (or establish a relationship with a primary care physician if you do not have one) for your aftercare needs so that they can reassess your need for medications and monitor your lab values.  DATA REVIEW:   CBC  Recent Labs  Lab 12/03/17 0421  WBC 14.7*  HGB 9.6*  HCT 29.7*  PLT 346    Chemistries  Recent Labs  Lab 11/30/17 1251 11/30/17 1252  12/03/17 0421  NA 142  --    < > 142  K 3.7  --    < > 3.4*  CL 110  --    < > 113*  CO2 19*  --    < > 22  GLUCOSE 124*  --    < > 116*  BUN 30*  --    < >  25*  CREATININE 3.70*  --    < > 1.93*  CALCIUM 8.9  --    < > 8.1*  MG  --  2.1  --   --   AST 32  --   --   --   ALT 18  --   --   --   ALKPHOS 62  --   --   --   BILITOT 0.7  --   --   --    < > = values in this interval not displayed.    Microbiology Results   Recent Results (from the past 240 hour(s))  Surgical PCR screen     Status: Abnormal   Collection Time: 11/30/17  4:43 PM  Result Value Ref Range Status   MRSA, PCR NEGATIVE NEGATIVE Final   Staphylococcus aureus POSITIVE (A) NEGATIVE Final    Comment: (NOTE) The Xpert SA Assay (FDA approved for NASAL specimens in patients 64 years of age and older), is one component of a comprehensive surveillance program. It is not intended to diagnose infection nor to guide or monitor treatment. Performed at [facility], [address] [place], [place] [address]     RADIOLOGY:  Dg Hip Operative Unilat W Or W/o Pelvis Left  Result Date: 12/01/2017 CLINICAL DATA:  Intraoperative localization for ORIF of left hip fracture. EXAM: LEFT FEMUR PORTABLE 2 VIEWS; OPERATIVE LEFT HIP WITH PELVIS COMPARISON:  Prior radiograph from 11/30/2017 FINDINGS: Postoperative changes from interval ORIF of previously identified left hip fracture. Interval placement of IM nail with distal interlocking screws. Hardware appears well positioned  without complication. Left hip fracture in gross anatomic alignment. No adverse features. Postoperative swelling and emphysema about the left hip and knee. Visualized bony pelvis intact. Vascular calcifications noted within the thigh. IMPRESSION: Sequelae of interval ORIF for proximal left femoral fracture without complication. Electronically Signed   By: Jeannine Boga M.D.   On: 12/01/2017 20:42   Dg Femur Port Min 2 Views Left  Result Date: 12/01/2017 CLINICAL DATA:  Intraoperative localization for ORIF of left hip fracture. EXAM: LEFT FEMUR PORTABLE 2 VIEWS; OPERATIVE LEFT HIP WITH PELVIS COMPARISON:  Prior radiograph from 11/30/2017 FINDINGS: Postoperative changes from interval ORIF of previously identified left hip fracture. Interval placement of IM nail with distal interlocking screws. Hardware appears well positioned without complication. Left hip fracture in gross anatomic alignment. No adverse features. Postoperative swelling and emphysema about the left hip and knee. Visualized bony pelvis intact. Vascular calcifications noted within the thigh. IMPRESSION: Sequelae of interval ORIF for proximal left femoral fracture without complication. Electronically Signed   By: Jeannine Boga M.D.   On: 12/01/2017 20:42     Management plans discussed with the patient, family and they are in agreement.  CODE STATUS:  Code Status History    Date Active Date Inactive Code Status Order ID Comments User Context   11/30/2017 1846 12/01/2017 2040 Full Code [id number]  Thornton Park, MD Inpatient   11/30/2017 1601 11/30/2017 1846 Full Code [id number]  Demetrios Loll, MD Inpatient      TOTAL TIME TAKING CARE OF THIS PATIENT: *40* minutes.    Fritzi Mandes M.D on 12/03/2017 at 10:31 AM  Between 7am to 6pm - Pager - [phone] After 6pm go to [email or web address] - password [email or web address] [facility]  Office  [phone]  CC: Primary care physician; Leone Haven, MD

## 2017-12-03 NOTE — Progress Notes (Signed)
OT Cancellation Note  Patient Details Name: Justin Andrews MRN: [id number] DOB: [birth year]   Cancelled Treatment:    Reason Eval/Treat Not Completed: Medical issues which prohibited therapy. Spoke with PT after PT session with pt. PT recommending hold OT treatment at this time 2/2 significant dizziness and fluctuating heart rate (78-112) during session. Will re-attempt at later date/time as pt is medically appropriate.  Jeni Salles, MPH, MS, OTR/L ascom [phone] 12/03/17, 10:13 AM

## 2017-12-03 NOTE — Progress Notes (Signed)
Patient is medically stable for D/C to [facility] today. Per Otila Klu[facility] liaison [facility] authorization has been received and patient can come today to room 608. RN will call report and arrange EMS for transport. Clinical Education officer, museum (CSW) sent D/C orders to [facility] via [facility]. Patient is aware of above. Patient's son Krzyzanowski is aware of above. Please reconsult if future social work needs arise. CSW signing off.   [employer], LCSW [phone]

## 2017-12-03 NOTE — Clinical Social Work Placement (Signed)
   CLINICAL SOCIAL WORK PLACEMENT  NOTE  Date:  12/03/2017  Patient Details  Name: Justin Andrews MRN: [id number] Date of Birth: 1934/05/20  Clinical Social Work is seeking post-discharge placement for this patient at the [place] level of care (*CSW will initial, date and re-position this form in  chart as items are completed):  Yes   Patient/family provided with [place] Work Department's list of facilities offering this level of care within the geographic area requested by the patient (or if unable, by the patient's family).  Yes   Patient/family informed of their freedom to choose among providers that offer the needed level of care, that participate in Medicare, Medicaid or managed care program needed by the patient, have an available bed and are willing to accept the patient.  Yes   Patient/family informed of [place]'s ownership interest in [facility] and [facility], as well as of the fact that they are under no obligation to receive care at these facilities.  PASRR submitted to EDS on 12/01/17     PASRR number received on 12/01/17     Existing PASRR number confirmed on       FL2 transmitted to all facilities in geographic area requested by pt/family on 12/01/17     FL2 transmitted to all facilities within larger geographic area on       Patient informed that his/her managed care company has contracts with or will negotiate with certain facilities, including the following:        Yes   Patient/family informed of bed offers received.  Patient chooses bed at ([facility] )     Physician recommends and patient chooses bed at      Patient to be transferred to ([facility] ) on 12/03/17.  Patient to be transferred to facility by [facility] )     Patient family notified on 12/03/17 of transfer.  Name of family member notified:  (Patient's son [name] is aware of D/C today. )     PHYSICIAN       Additional Comment:     _______________________________________________ Caralynn Gelber, Veronia Beets, LCSW 12/03/2017, 11:31 AM

## 2017-12-03 NOTE — Care Management Important Message (Signed)
Important Message  Patient Details  Name: Justin Andrews MRN: [id number] Date of Birth: [birth year]   Medicare Important Message Given:  Yes Patient is on isolation so IM cannot be brought back out for the chart.    Marshell Garfinkel, RN 12/03/2017, 11:37 AM

## 2017-12-03 NOTE — Progress Notes (Signed)
Report called and given to [place] at [facility]. EMS called for transport. IVs removed. Pt dressed awaiting transfer.

## 2017-12-07 DIAGNOSIS — D649 Anemia, unspecified: Secondary | ICD-10-CM | POA: Diagnosis not present

## 2017-12-07 DIAGNOSIS — I10 Essential (primary) hypertension: Secondary | ICD-10-CM | POA: Diagnosis not present

## 2017-12-07 DIAGNOSIS — S72002D Fracture of unspecified part of neck of left femur, subsequent encounter for closed fracture with routine healing: Secondary | ICD-10-CM | POA: Diagnosis not present

## 2017-12-07 DIAGNOSIS — N183 Chronic kidney disease, stage 3 (moderate): Secondary | ICD-10-CM | POA: Diagnosis not present

## 2017-12-08 NOTE — Progress Notes (Signed)
No answer and no voice mail.

## 2017-12-11 NOTE — Progress Notes (Signed)
No voicemail cannot leave message.

## 2017-12-12 DIAGNOSIS — S72002D Fracture of unspecified part of neck of left femur, subsequent encounter for closed fracture with routine healing: Secondary | ICD-10-CM | POA: Diagnosis not present

## 2017-12-12 DIAGNOSIS — D649 Anemia, unspecified: Secondary | ICD-10-CM | POA: Diagnosis not present

## 2017-12-12 DIAGNOSIS — N183 Chronic kidney disease, stage 3 (moderate): Secondary | ICD-10-CM | POA: Diagnosis not present

## 2017-12-12 DIAGNOSIS — I10 Essential (primary) hypertension: Secondary | ICD-10-CM | POA: Diagnosis not present

## 2017-12-18 NOTE — Progress Notes (Signed)
I have tried multiple time s to reach this patient .

## 2017-12-19 DIAGNOSIS — M25552 Pain in left hip: Secondary | ICD-10-CM | POA: Diagnosis not present

## 2017-12-22 ENCOUNTER — Telehealth: Payer: Self-pay | Admitting: Family Medicine

## 2017-12-22 NOTE — Telephone Encounter (Signed)
Copied from [place] [phone]. Topic: General - Other >> Dec 22, 2017  3:04 PM Adelene Idler wrote: Patient is calling in wanting a callback from Dr Milbert Coulter concerning his admission from the hospital to rehab  CB# [phone]

## 2017-12-22 NOTE — Telephone Encounter (Signed)
Please advise 

## 2017-12-24 NOTE — Telephone Encounter (Signed)
Called and spoke with patient. Pt advised and voiced understanding.  

## 2017-12-24 NOTE — Telephone Encounter (Signed)
Once he leaves rehab he should be under my care.

## 2017-12-24 NOTE — Telephone Encounter (Signed)
Called patient. Pt wanted to know once he leaves the rehab will he be in the hospitals care of Dr. Ellen Henri care?

## 2017-12-25 DIAGNOSIS — R69 Illness, unspecified: Secondary | ICD-10-CM | POA: Diagnosis not present

## 2017-12-25 DIAGNOSIS — S72002D Fracture of unspecified part of neck of left femur, subsequent encounter for closed fracture with routine healing: Secondary | ICD-10-CM | POA: Diagnosis not present

## 2017-12-25 DIAGNOSIS — N401 Enlarged prostate with lower urinary tract symptoms: Secondary | ICD-10-CM | POA: Diagnosis not present

## 2017-12-25 DIAGNOSIS — I10 Essential (primary) hypertension: Secondary | ICD-10-CM | POA: Diagnosis not present

## 2017-12-25 DIAGNOSIS — D649 Anemia, unspecified: Secondary | ICD-10-CM | POA: Diagnosis not present

## 2017-12-25 DIAGNOSIS — N183 Chronic kidney disease, stage 3 (moderate): Secondary | ICD-10-CM | POA: Diagnosis not present

## 2017-12-29 ENCOUNTER — Telehealth: Payer: Self-pay | Admitting: *Deleted

## 2017-12-29 NOTE — Telephone Encounter (Signed)
FYI patient at assisted living at [facility] scheduled appointment with PCP.

## 2017-12-29 NOTE — Telephone Encounter (Signed)
Copied from [place] [phone]. Topic: General - Other >> Dec 29, 2017  3:30 PM Marin Olp L wrote: Reason for CRM: Patient needs to be seen by PCP, next avail 03/03/2018 which is too far. Unsure whether he has symptoms or if this is an urgent needs per call Lakeisha at the [facility] assisted living. P: [phone]

## 2018-01-01 NOTE — Telephone Encounter (Signed)
Noted. This appointment is not appropriate for a 15 minute time slot. Please see if he can reschedule to a 4:30 time slot on a Monday or a Wednesday.

## 2018-01-02 ENCOUNTER — Telehealth: Payer: Self-pay | Admitting: *Deleted

## 2018-01-02 ENCOUNTER — Other Ambulatory Visit: Payer: Self-pay

## 2018-01-02 NOTE — Telephone Encounter (Signed)
Tried to  [facility] to find out why patient needed aspirin prescribed no answer and no left voicemail with charge nurse.  Called [facility] multiple times to see if patient mobile no answer an d no voicemail.

## 2018-01-02 NOTE — Telephone Encounter (Signed)
It appears his allergy is listed as bleeding related to aspirin. Please contact the assisted living facility and find out what they need medication for. I have to have a symptom or indication for him to be able to provide a specific medication he can take. Thanks.

## 2018-01-02 NOTE — Telephone Encounter (Signed)
I have reviewed the discharge summary and the orthopedic surgeon's note from his hospitalization.  They recommended Lovenox for 4 weeks in the discharge summary and between 4 and 6 weeks in the orthopedic surgeons note from the hospital.  He has an allergy to aspirin and cannot take aspirin.  They should not give aspirin to him.  It appears he should have completed 4 weeks of Lovenox at this time.  If he needs further DVT prophylaxis that will need to be with Lovenox.  Please try to determine if [facility] felt he needed an additional 2 weeks of DVT prophylaxis and for what reason.  Thanks.

## 2018-01-02 NOTE — Telephone Encounter (Signed)
Copied from [place] [phone]. Topic: General - Other >> Jan 02, 2018  2:47 PM [name] wrote: Reason for CRM: [name] is calling to state she sent over document in regards to the patient Allergies: aspirin, she stated she is needing to know what other medication can patient take. Best contact number is: [phone] Fax: [phone] >> Jan 02, 2018  3:57 PM [name], NT wrote: [name] with [facility] calling and states that the facility that the pt resides at sent a rx order for aspirin over. Pt is allergic. [name] will fax the rx to the office for review. CB#: [phone]

## 2018-01-02 NOTE — Telephone Encounter (Signed)
Rescheduled for 01/16/17 at 4:30

## 2018-01-02 NOTE — Telephone Encounter (Signed)
Patient was DC form [facility] on 12/30/17 with order to DC Lovenox and to start Aspirin 325 mg BID for 2 weeks as prophylaxis for DVT . [facility] will not give another order due to he is DC from they're care.

## 2018-01-02 NOTE — Telephone Encounter (Signed)
Left detailed message for Justin Andrews to return call to office.

## 2018-01-05 DIAGNOSIS — H3562 Retinal hemorrhage, left eye: Secondary | ICD-10-CM | POA: Diagnosis not present

## 2018-01-05 DIAGNOSIS — H25013 Cortical age-related cataract, bilateral: Secondary | ICD-10-CM | POA: Diagnosis not present

## 2018-01-05 DIAGNOSIS — H25043 Posterior subcapsular polar age-related cataract, bilateral: Secondary | ICD-10-CM | POA: Diagnosis not present

## 2018-01-05 DIAGNOSIS — H2513 Age-related nuclear cataract, bilateral: Secondary | ICD-10-CM | POA: Diagnosis not present

## 2018-01-06 ENCOUNTER — Telehealth: Payer: Self-pay

## 2018-01-06 NOTE — Telephone Encounter (Signed)
Pt is requesting a refill for tramadol   Last refilled 12/03/2017 disp 30 with no refills    Sent to PCP for approval

## 2018-01-06 NOTE — Telephone Encounter (Signed)
It appears the patient was recently given oxycodone by another provider. Please contact the patient and see how much of that he has left. Thanks.

## 2018-01-06 NOTE — Telephone Encounter (Signed)
Called patient and was unable to leave a VM to call back due to mailbox being full.

## 2018-01-08 NOTE — Telephone Encounter (Signed)
Called patient again and the mailbox is still full and patient can not accept VM's at this time.

## 2018-01-09 ENCOUNTER — Telehealth: Payer: Self-pay | Admitting: *Deleted

## 2018-01-09 NOTE — Telephone Encounter (Signed)
Copied from [place] [phone]. Topic: General - Other >> Jan 09, 2018 10:40 AM Yvette Rack wrote: Reason for CRM: Pt returned call to office. Pt stated he does not have any idea of the amount of oxycodone that he has remaining because it is controlled by the facility where he lives.

## 2018-01-10 NOTE — Telephone Encounter (Signed)
Please contact the patient's living facility to see if he has oxycodone left or tramadol left.  Thanks.

## 2018-01-10 NOTE — Telephone Encounter (Signed)
See my message below 

## 2018-01-12 ENCOUNTER — Telehealth: Payer: Self-pay | Admitting: Family Medicine

## 2018-01-12 NOTE — Telephone Encounter (Signed)
[facility] dropped off several forms to be filled out they are wanting to get them Friday at pt appt. Placed in Dr.Sonnenbergs color folder upfront

## 2018-01-12 NOTE — Telephone Encounter (Signed)
Called [facility] spoke with Deberah Pelton patient has tramadol and oxycodone per facility to last at 30 days.

## 2018-01-12 NOTE — Telephone Encounter (Signed)
Noted.  He should not need refills then.

## 2018-01-13 ENCOUNTER — Ambulatory Visit: Payer: Medicare HMO | Admitting: Gastroenterology

## 2018-01-13 NOTE — Telephone Encounter (Signed)
Placed on PCP's desk to sign and complete

## 2018-01-14 ENCOUNTER — Ambulatory Visit: Payer: Medicare HMO | Admitting: Family Medicine

## 2018-01-15 NOTE — Telephone Encounter (Signed)
[facility] dropped off more paperwork to be filled out  Placed in Dr. Tharon Aquas color folder upfront

## 2018-01-15 NOTE — Telephone Encounter (Signed)
I will complete after the patients visit tomorrow.

## 2018-01-16 ENCOUNTER — Ambulatory Visit (INDEPENDENT_AMBULATORY_CARE_PROVIDER_SITE_OTHER): Payer: Medicare HMO | Admitting: Family Medicine

## 2018-01-16 ENCOUNTER — Encounter: Payer: Self-pay | Admitting: Family Medicine

## 2018-01-16 VITALS — BP 122/58 | HR 72 | Temp 98.2°F | Ht 67.0 in | Wt 132.0 lb

## 2018-01-16 DIAGNOSIS — F419 Anxiety disorder, unspecified: Secondary | ICD-10-CM

## 2018-01-16 DIAGNOSIS — D649 Anemia, unspecified: Secondary | ICD-10-CM | POA: Diagnosis not present

## 2018-01-16 DIAGNOSIS — C4322 Malignant melanoma of left ear and external auricular canal: Secondary | ICD-10-CM | POA: Diagnosis not present

## 2018-01-16 DIAGNOSIS — K319 Disease of stomach and duodenum, unspecified: Secondary | ICD-10-CM

## 2018-01-16 DIAGNOSIS — N183 Chronic kidney disease, stage 3 unspecified: Secondary | ICD-10-CM

## 2018-01-16 DIAGNOSIS — S72002D Fracture of unspecified part of neck of left femur, subsequent encounter for closed fracture with routine healing: Secondary | ICD-10-CM | POA: Diagnosis not present

## 2018-01-16 DIAGNOSIS — F329 Major depressive disorder, single episode, unspecified: Secondary | ICD-10-CM

## 2018-01-16 DIAGNOSIS — F32A Depression, unspecified: Secondary | ICD-10-CM

## 2018-01-16 DIAGNOSIS — R69 Illness, unspecified: Secondary | ICD-10-CM | POA: Diagnosis not present

## 2018-01-16 NOTE — Patient Instructions (Signed)
Nice to see you. We will have you return for lab work. We will refer you to psychiatry, dermatology, and GI. If you have worsening depression please let us know.  If you develop plan or intent to harm your self or active suicidal thoughts please seek medical attention immediately.

## 2018-01-18 NOTE — Assessment & Plan Note (Signed)
This is relatively stable.  He does note intermittent suicidal ideation.  He has no intent or plan.  I have advised him, his son, and the caseworker that came with them that if he were to develop any intent or plan to harm himself he would need to go to the emergency room or call 911.  They all verbalized understanding and noted that they would do this.  He will continue his Zoloft.  We will refer him to psychiatry.  He is given return precautions.

## 2018-01-18 NOTE — Assessment & Plan Note (Signed)
Recheck renal function. ?

## 2018-01-18 NOTE — Assessment & Plan Note (Signed)
Patient has done fairly well after surgery.  He will continue to see orthopedics.  He will continue pain medication as needed.

## 2018-01-18 NOTE — Assessment & Plan Note (Signed)
Recheck CBC. 

## 2018-01-18 NOTE — Assessment & Plan Note (Signed)
We will refer him back to dermatology.  He has transportation at this time.

## 2018-01-18 NOTE — Assessment & Plan Note (Signed)
We will refer him back to GI.  He has transportation at this time and is willing to see them.

## 2018-01-18 NOTE — Progress Notes (Signed)
[name], MD Phone: [phone]  Justin Andrews is a 84 y.o. male who presents today for follow-up.  CC: Femur fracture, depression, melanoma, duodenal abnormality  Femur fracture: Patient was hospitalized following a mechanical fall with left femur fracture.  He underwent surgical intervention and has done well since then.  He went to rehab for physical therapy and did well there.  He has followed up with orthopedics.  He does still continue to have some left hip pain though appears to be healing well.  He has been taking oxycodone which does help with the pain.  He notes this does not make him drowsy.  Based on review of his MAR from his current facility he has not been getting tramadol.  He does have follow-up scheduled with orthopedics.  He did complete 4 weeks of postoperative Lovenox.  Depression: Patient was previously referred to a psychiatrist though did not end up seeing anyone prior to his fall and femur fracture.  He continues on Zoloft.  This issue is relatively stable.  He does have some intermittent suicidal ideation though no plan or intent to harm himself.  Melanoma: Patient has been unable to follow-up with dermatology.  Multiple appointments have been made for him to see them though he has had transportation issues.  He notes he does have transportation currently.  Duodenal issue: Patient was previously referred to GI for evaluation of this.  He declined evaluation initially though more recently he was okay with this.  He will be referred.  It sounds as though he was treated for C. difficile at some point.  He notes no diarrhea.  The patient's son pulled me aside after the patient's office visit to relay that the patient had issues with alcohol abuse.  He also noted that prior to going into the hospital the patient had only about a third of his tramadol left after about a week of his prescription.  The son was unsure if the patient was selling them or using excessive  amounts.  Social History   Tobacco Use  Smoking Status Current Every Day Smoker  . Packs/day: 1.00  . Years: 70.00  . Pack years: 70.00  . Types: Cigarettes  Smokeless Tobacco Never Used     ROS see history of present illness  Objective  Physical Exam Vitals:   01/16/18 1639  BP: (!) 122/58  Pulse: 72  Temp: 98.2 F (36.8 C)  SpO2: 96%    BP Readings from Last 3 Encounters:  01/16/18 (!) 122/58  12/03/17 (!) 119/57  11/19/17 (!) 151/75   Wt Readings from Last 3 Encounters:  01/16/18 132 lb (59.9 kg)  11/30/17 138 lb 12.8 oz (63 kg)  11/19/17 138 lb 12.8 oz (63 kg)    Physical Exam Constitutional:      General: He is not in acute distress.    Appearance: He is not diaphoretic.  Cardiovascular:     Rate and Rhythm: Normal rate and regular rhythm.     Heart sounds: Normal heart sounds.  Pulmonary:     Effort: Pulmonary effort is normal.     Breath sounds: Normal breath sounds.  Musculoskeletal:     Comments: Well-healed incision over left hip, no signs of infection, nontender  Skin:    General: Skin is warm and dry.  Neurological:     Mental Status: He is alert.      Assessment/Plan: Please see individual problem list.  Closed left hip fracture [facility]) Patient has done fairly well after surgery.  He will continue to see orthopedics.  He will continue pain medication as needed.  Malignant melanoma of left ear [facility]) We will refer him back to dermatology.  He has transportation at this time.  Duodenum disorder We will refer him back to GI.  He has transportation at this time and is willing to see them.  CKD (chronic kidney disease) stage 3, GFR 30-59 ml/min Recheck renal function.  Anxiety and depression This is relatively stable.  He does note intermittent suicidal ideation.  He has no intent or plan.  I have advised him, his son, and the caseworker that came with them that if he were to develop any intent or plan to harm himself he would need to go  to the emergency room or call 911.  They all verbalized understanding and noted that they would do this.  He will continue his Zoloft.  We will refer him to psychiatry.  He is given return precautions.  Anemia Recheck CBC.   Orders Placed This Encounter  Procedures  . CBC    Standing Status:   Future    Standing Expiration Date:   01/17/2019  . Comp Met (CMET)    Standing Status:   Future    Standing Expiration Date:   01/17/2019  . Ambulatory referral to Dermatology    Referral Priority:   Urgent    Referral Type:   Consultation    Referral Reason:   Specialty Services Required    Requested Specialty:   Dermatology    Number of Visits Requested:   1  . Ambulatory referral to Gastroenterology    Referral Priority:   Routine    Referral Type:   Consultation    Referral Reason:   Specialty Services Required    Number of Visits Requested:   1  . Ambulatory referral to Psychiatry    Referral Priority:   Routine    Referral Type:   Psychiatric    Referral Reason:   Specialty Services Required    Requested Specialty:   Psychiatry    Number of Visits Requested:   1    No orders of the defined types were placed in this encounter.    [name], MD [place]

## 2018-01-22 MED ORDER — OXYCODONE HCL 5 MG PO TABS: 5.0000 mg | ORAL_TABLET | ORAL | 0 refills | Status: DC | PRN

## 2018-01-22 MED ORDER — TRAMADOL HCL 50 MG PO TABS: 50.0000 mg | ORAL_TABLET | Freq: Four times a day (QID) | ORAL | 1 refills | Status: DC | PRN

## 2018-01-22 NOTE — Addendum Note (Signed)
Addended by: Caryl Bis, Lachae Hohler G on: 01/22/2018 01:12 PM   Modules accepted: Orders

## 2018-01-22 NOTE — Progress Notes (Signed)
Medication refill request reviewed for tramadol and oxycodone. Short term oxycodone refill given. Tramadol refill given. Controlled substance database reviewed.

## 2018-01-23 ENCOUNTER — Other Ambulatory Visit (INDEPENDENT_AMBULATORY_CARE_PROVIDER_SITE_OTHER): Payer: Medicare HMO

## 2018-01-23 DIAGNOSIS — N183 Chronic kidney disease, stage 3 unspecified: Secondary | ICD-10-CM

## 2018-01-23 DIAGNOSIS — D649 Anemia, unspecified: Secondary | ICD-10-CM

## 2018-01-23 LAB — COMPREHENSIVE METABOLIC PANEL
ALT: 5 U/L (ref 0–53)
AST: 11 U/L (ref 0–37)
Albumin: 3.9 g/dL (ref 3.5–5.2)
Alkaline Phosphatase: 112 U/L (ref 39–117)
BUN: 31 mg/dL — ABNORMAL HIGH (ref 6–23)
CO2: 28 mEq/L (ref 19–32)
Calcium: 10.1 mg/dL (ref 8.4–10.5)
Chloride: 105 mEq/L (ref 96–112)
Creatinine, Ser: 1.99 mg/dL — ABNORMAL HIGH (ref 0.40–1.50)
GFR: 32.33 mL/min — ABNORMAL LOW (ref >60.00)
GLUCOSE: 90 mg/dL (ref 70–99)
POTASSIUM: 4.6 meq/L (ref 3.5–5.1)
Sodium: 140 mEq/L (ref 135–145)
TOTAL PROTEIN: 7.3 g/dL (ref 6.0–8.3)
Total Bilirubin: 0.3 mg/dL (ref 0.2–1.2)

## 2018-01-23 LAB — CBC
HEMATOCRIT: 30 % — AB (ref 39.0–52.0)
HEMOGLOBIN: 10.3 g/dL — AB (ref 13.0–17.0)
MCHC: 34.3 g/dL (ref 30.0–36.0)
MCV: 94.1 fl (ref 78.0–100.0)
Platelets: 335 10*3/uL (ref 150.0–400.0)
RBC: 3.19 Mil/uL — ABNORMAL LOW (ref 4.22–5.81)
RDW: 14.1 % (ref 11.5–15.5)
WBC: 8.7 10*3/uL (ref 4.0–10.5)

## 2018-02-02 DIAGNOSIS — H2513 Age-related nuclear cataract, bilateral: Secondary | ICD-10-CM | POA: Diagnosis not present

## 2018-02-05 ENCOUNTER — Other Ambulatory Visit: Payer: Self-pay | Admitting: Family Medicine

## 2018-02-05 NOTE — Telephone Encounter (Signed)
Please contact the patient or his son if he is on his DPR. I thought he was going in to a SNF and if that is the case he should be following with a physician there that can determine if this medication is necessary. If he is not in a SNF this medication is not meant for long term use and we will need to transition him back to tramadol.

## 2018-02-05 NOTE — Telephone Encounter (Signed)
Copied from [place] [phone]. Topic: Quick Communication - Rx Refill/Question >> Feb 05, 2018  9:07 AM Carolyn Stare wrote: Medication   oxyCODONE (OXY IR/ROXICODONE) 5 MG immediate release tablet  Has the patient contacted their pharmacy yes no:[id number]}  (Preferred Pharmacy   [facility]: Please be advised that RX refills may take up to 3 business days. We ask that you follow-up with your pharmacy.

## 2018-02-05 NOTE — Telephone Encounter (Signed)
Requested medication (s) are due for refill today: Yes  Requested medication (s) are on the active medication list: Yes  Last refill:  01/22/18  Future visit scheduled: Yes  Notes to clinic:  Unable to refill per protocol, not delegated.     Requested Prescriptions  Pending Prescriptions Disp Refills   oxyCODONE (OXY IR/ROXICODONE) 5 MG immediate release tablet 15 tablet 0    Sig: Take 1 tablet (5 mg total) by mouth every 4 (four) hours as needed for severe pain (pain score 4-6).     Not Delegated - Analgesics:  Opioid Agonists Failed - 02/05/2018  9:17 AM      Failed - This refill cannot be delegated      Passed - Urine Drug Screen completed in last 360 days.      Passed - Valid encounter within last 6 months    Recent Outpatient Visits          2 weeks ago Anemia, unspecified type   [facility] [place], Angela Adam, MD   2 months ago DOE (dyspnea on exertion)   [facility] Leone Haven, MD   3 months ago Frequency of urination   [facility], Angela Adam, MD   4 months ago Cough   [facility] [place], Jacquelynn Cree, FNP   4 months ago Anxiety and depression   [facility], Angela Adam, MD      Future Appointments            In 2 months Caryl Bis, Angela Adam, MD [facility], [place]   In 5 months [facility], [place], [place], [place]   In 5 months Caryl Bis, Angela Adam, MD [facility], [facility]

## 2018-02-06 ENCOUNTER — Telehealth: Payer: Self-pay | Admitting: *Deleted

## 2018-02-06 NOTE — Telephone Encounter (Signed)
Copied from [place] [id number]. Topic: Referral - Status >> Feb 06, 2018  2:22 PM Rutherford Nail, [place] wrote: Reason for CRM: Patient's son, Gerding, calling to check the status of the dermatology and psychiatry referral. States that he has not heard anything from either of the offices.

## 2018-02-09 NOTE — Telephone Encounter (Signed)
Noted.  Please contact the [facility] and see how much of his medication he has been requiring.  Please also see if he is in skilled nursing and if he is he should be following with a physician at [facility].Marland Kitchen

## 2018-02-11 NOTE — Telephone Encounter (Signed)
Contacted pt's son. Gave him [facility]'s phone number to call and schedule since they will not let me schedule for him.Marland Kitchen Psych referral sent to [facility]. They will contact him to schedule.

## 2018-02-12 ENCOUNTER — Telehealth: Payer: Self-pay

## 2018-02-12 NOTE — Telephone Encounter (Signed)
Copied from [place]. Topic: General - Inquiry >> Feb 12, 2018  9:28 AM Margot Ables wrote: Reason for CRM: Amber is asking if pt can come in for TB testing. Please advise.   **Appointment made for 02/25/18.**

## 2018-02-12 NOTE — Telephone Encounter (Signed)
Copied from [place]. Topic: General - Inquiry >> Feb 12, 2018  9:28 AM Margot Ables wrote: Reason for CRM: Amber is asking if pt can come in for TB testing. Please advise.

## 2018-02-12 NOTE — Telephone Encounter (Signed)
LM at [facility] to for someone to call back regarding this medication.

## 2018-02-13 ENCOUNTER — Other Ambulatory Visit (INDEPENDENT_AMBULATORY_CARE_PROVIDER_SITE_OTHER): Payer: Medicare HMO

## 2018-02-13 DIAGNOSIS — I10 Essential (primary) hypertension: Secondary | ICD-10-CM | POA: Diagnosis not present

## 2018-02-13 NOTE — Addendum Note (Signed)
Addended by: Arby Barrette on: 02/13/2018 02:33 PM   Modules accepted: Orders

## 2018-02-14 LAB — BASIC METABOLIC PANEL
BUN / CREAT RATIO: 13 (calc) (ref 6–22)
BUN: 26 mg/dL — ABNORMAL HIGH (ref 7–25)
CO2: 24 mmol/L (ref 20–32)
Calcium: 10.2 mg/dL (ref 8.6–10.3)
Chloride: 107 mmol/L (ref 98–110)
Creat: 2.08 mg/dL — ABNORMAL HIGH (ref 0.70–1.11)
GLUCOSE: 95 mg/dL (ref 65–99)
Potassium: 4.8 mmol/L (ref 3.5–5.3)
SODIUM: 140 mmol/L (ref 135–146)

## 2018-02-17 ENCOUNTER — Telehealth: Payer: Self-pay | Admitting: Family Medicine

## 2018-02-17 ENCOUNTER — Ambulatory Visit: Payer: Medicare HMO | Admitting: Gastroenterology

## 2018-02-17 NOTE — Telephone Encounter (Signed)
Copied from [place] [phone]. Topic: Quick Communication - See Telephone Encounter >> Feb 17, 2018  1:14 PM Vernona Rieger wrote: CRM for notification. See Telephone encounter for: 02/17/18.  Amber with [facility] called and wanted to know does he still need continue his oxyCODONE (OXY IR/ROXICODONE) 5 MG immediate release tablet and if so can that be faxed to them at [phone]. She said he has been without it for a while. She can be reached at [phone]

## 2018-02-17 NOTE — Telephone Encounter (Signed)
I would like to transition him back to tramadol.  Please additionally find out if he is in skilled nursing and if he is there should be a physician that is following him there.  Thanks.

## 2018-02-17 NOTE — Telephone Encounter (Signed)
Sent to PCP please advise.

## 2018-02-18 ENCOUNTER — Ambulatory Visit: Payer: Self-pay | Admitting: Family Medicine

## 2018-02-18 MED ORDER — TRAMADOL HCL 50 MG PO TABS: 50.0000 mg | ORAL_TABLET | Freq: Four times a day (QID) | ORAL | 1 refills | Status: DC | PRN

## 2018-02-18 NOTE — Telephone Encounter (Signed)
Tramadol sent to pharmacy.  Please create a written prescription with the patient's information on it and then I can place an order to discontinue oxycodone and start tramadol.  Thanks.

## 2018-02-18 NOTE — Telephone Encounter (Signed)
Called and spoke with [facility] she stated that the pt is in assisted living NOT skilled nursing. There are two physicians on site there however, pt declined there services because he wanted to stick with Dr. Caryl Bis.   Pt will need Rx sent to [facility] in [place] .   Amber stated that she will need written orders to d/c the oxy and to start the tramadol with dose and frequency.   Sent to PCP

## 2018-02-19 ENCOUNTER — Other Ambulatory Visit: Payer: Self-pay | Admitting: Family Medicine

## 2018-02-19 ENCOUNTER — Telehealth: Payer: Self-pay

## 2018-02-19 DIAGNOSIS — N183 Chronic kidney disease, stage 3 unspecified: Secondary | ICD-10-CM

## 2018-02-19 NOTE — Telephone Encounter (Signed)
Pt was calling in regards to switching his medication from Oxy to tramadol. I advised pt that this should be done and faxed over tomorrow with the orders as request by the assisted living  where he is at pt advised and voiced understanding no other questions or concern at this time.

## 2018-02-19 NOTE — Telephone Encounter (Signed)
Done placed on Dr. Caryl Bis desk.

## 2018-02-19 NOTE — Telephone Encounter (Signed)
Copied from [place] [phone]. Topic: General - Inquiry >> Feb 19, 2018  2:47 PM Conception Chancy, NT wrote: Reason for CRM: patient is calling and wants to discuss something with Dr. Caryl Bis. Would not go into detail.

## 2018-02-21 NOTE — Telephone Encounter (Signed)
Completed.  Please fax.

## 2018-02-23 NOTE — Telephone Encounter (Signed)
This has bee faxed. SB 02/23/2018 10:39 AM

## 2018-02-25 ENCOUNTER — Ambulatory Visit (INDEPENDENT_AMBULATORY_CARE_PROVIDER_SITE_OTHER): Payer: Medicare HMO

## 2018-02-25 ENCOUNTER — Ambulatory Visit: Payer: Medicare HMO

## 2018-02-25 DIAGNOSIS — Z111 Encounter for screening for respiratory tuberculosis: Secondary | ICD-10-CM

## 2018-02-25 NOTE — Progress Notes (Signed)
Patient presented for PPD test to left forearm, patient voiced no concerns nor showed any signs of distress during injection.

## 2018-02-25 NOTE — Progress Notes (Signed)
I have reviewed the above note and agree.  Tyrina Hines, M.D.  

## 2018-03-06 ENCOUNTER — Telehealth: Payer: Self-pay | Admitting: *Deleted

## 2018-03-06 NOTE — Telephone Encounter (Signed)
Copied from [place] [id number]. Topic: Appointment Scheduling - Scheduling Inquiry for Clinic >> Mar 06, 2018  1:33 PM Margot Ables wrote: Reason for CRM: pt called stating he is having left knee pain every since his hip surgery. He is also wanting to ask the doctor about some other things that he did not want to discuss and states he would rather discuss with his doctor. Pt does not want appt with alternate provider. Please advise on scheduling with Dr. Caryl Bis.

## 2018-03-06 NOTE — Telephone Encounter (Signed)
Spoke with son , and he advised he would call back due to PCP is out of office next week and let me know who patient would like to see or wait for PCP.

## 2018-03-12 ENCOUNTER — Other Ambulatory Visit: Payer: Self-pay | Admitting: Family Medicine

## 2018-03-12 NOTE — Telephone Encounter (Signed)
Called and spoke with pt's son. Son stated that he father is no longer staying at the facility and is now back home taking the medication on his own. He stated that he father has seemed to go back to her old way and might be taking too much due to signs of slurred speech when the son went to visit him a few days ago.   Margorie John stated that the manager at the facility that he was at also stated that the patient would constantly as for his tramadol medication even if 6 hours hadn't passed however they continued to only give the Rx as prescribed.   Rx was last refilled 02/18/2018 disp 120 with 1 refill   Pt should be able to get an Rx on March the 12.   Sent to PCP as an [place] of the situation.

## 2018-03-12 NOTE — Telephone Encounter (Signed)
Medication not delegated to NT to refill Routing back to office

## 2018-03-12 NOTE — Telephone Encounter (Signed)
Copied from [place] [id number] [id number]. Topic: Quick Communication - Rx Refill/Question >> Mar 12, 2018 10:54 AM Sheran Luz wrote: Medication:traMADol (ULTRAM) 50 MG tablet    Patient is requesting refill of this medication.   Preferred Pharmacy (with phone number or street name): [id number] [address], [place]  [phone] (Phone) [phone] (Fax)

## 2018-03-13 ENCOUNTER — Telehealth: Payer: Self-pay

## 2018-03-13 ENCOUNTER — Encounter: Payer: Self-pay | Admitting: Emergency Medicine

## 2018-03-13 ENCOUNTER — Emergency Department
Admission: EM | Admit: 2018-03-13 | Discharge: 2018-03-14 | Disposition: A | Discharge status: Home / Self Care | Payer: Medicare HMO | Attending: Emergency Medicine | Admitting: Emergency Medicine

## 2018-03-13 ENCOUNTER — Other Ambulatory Visit: Payer: Self-pay

## 2018-03-13 DIAGNOSIS — R45851 Suicidal ideations: Secondary | ICD-10-CM | POA: Diagnosis not present

## 2018-03-13 DIAGNOSIS — I129 Hypertensive chronic kidney disease with stage 1 through stage 4 chronic kidney disease, or unspecified chronic kidney disease: Secondary | ICD-10-CM | POA: Diagnosis not present

## 2018-03-13 DIAGNOSIS — F1994 Other psychoactive substance use, unspecified with psychoactive substance-induced mood disorder: Secondary | ICD-10-CM | POA: Insufficient documentation

## 2018-03-13 DIAGNOSIS — Z79899 Other long term (current) drug therapy: Secondary | ICD-10-CM | POA: Insufficient documentation

## 2018-03-13 DIAGNOSIS — R69 Illness, unspecified: Secondary | ICD-10-CM | POA: Diagnosis not present

## 2018-03-13 DIAGNOSIS — Z046 Encounter for general psychiatric examination, requested by authority: Secondary | ICD-10-CM | POA: Diagnosis present

## 2018-03-13 DIAGNOSIS — N183 Chronic kidney disease, stage 3 (moderate): Secondary | ICD-10-CM | POA: Insufficient documentation

## 2018-03-13 DIAGNOSIS — F17210 Nicotine dependence, cigarettes, uncomplicated: Secondary | ICD-10-CM | POA: Diagnosis not present

## 2018-03-13 LAB — COMPREHENSIVE METABOLIC PANEL
ALT: 13 U/L (ref 0–44)
ANION GAP: 17 — AB (ref 5–15)
AST: 26 U/L (ref 15–41)
Albumin: 4.1 g/dL (ref 3.5–5.0)
Alkaline Phosphatase: 99 U/L (ref 38–126)
BUN: 24 mg/dL — ABNORMAL HIGH (ref 8–23)
CO2: 18 mmol/L — ABNORMAL LOW (ref 22–32)
Calcium: 9.2 mg/dL (ref 8.9–10.3)
Chloride: 107 mmol/L (ref 98–111)
Creatinine, Ser: 1.81 mg/dL — ABNORMAL HIGH (ref 0.61–1.24)
GFR calc non Af Amer: 34 mL/min — ABNORMAL LOW (ref >60)
GFR, EST AFRICAN AMERICAN: 39 mL/min — AB (ref >60)
Glucose, Bld: 95 mg/dL (ref 70–99)
Potassium: 3.8 mmol/L (ref 3.5–5.1)
Sodium: 142 mmol/L (ref 135–145)
Total Bilirubin: 0.5 mg/dL (ref 0.3–1.2)
Total Protein: 7.8 g/dL (ref 6.5–8.1)

## 2018-03-13 LAB — ACETAMINOPHEN LEVEL: Acetaminophen (Tylenol), Serum: <10 ug/mL — ABNORMAL LOW (ref 10–30)

## 2018-03-13 LAB — SALICYLATE LEVEL: Salicylate Lvl: <7 mg/dL (ref 2.8–30.0)

## 2018-03-13 LAB — CBC
HCT: 37.6 % — ABNORMAL LOW (ref 39.0–52.0)
Hemoglobin: 12.3 g/dL — ABNORMAL LOW (ref 13.0–17.0)
MCH: 30.1 pg (ref 26.0–34.0)
MCHC: 32.7 g/dL (ref 30.0–36.0)
MCV: 92.2 fL (ref 80.0–100.0)
PLATELETS: 343 10*3/uL (ref 150–400)
RBC: 4.08 MIL/uL — ABNORMAL LOW (ref 4.22–5.81)
RDW: 13.4 % (ref 11.5–15.5)
WBC: 10.9 10*3/uL — ABNORMAL HIGH (ref 4.0–10.5)
nRBC: 0 % (ref 0.0–0.2)

## 2018-03-13 LAB — ETHANOL: Alcohol, Ethyl (B): 90 mg/dL — ABNORMAL HIGH (ref <10)

## 2018-03-13 MED ORDER — TRAMADOL HCL 50 MG PO TABS
50.0000 mg | ORAL_TABLET | Freq: Once | ORAL | Status: AC
  Administered 2018-03-13: 50 mg via ORAL
  Filled 2018-03-13: qty 1

## 2018-03-13 NOTE — Telephone Encounter (Signed)
Called and spoke with pt's son he stated that we need to cancel all refills left for his father's tramadol. Son stated that his father is miss using the medication and has been treating suicide and acting out to the point he had to call the police.   I called [facility] and they stated that the patient does have two refills left on file and the PCP will need to fax over orders to D/C remanding refills on file.   Sent to PCP

## 2018-03-13 NOTE — ED Notes (Signed)
Pt brought from triage being uncooperative, pt only dressed out at this time, pt still needing blood work and urine, pt provided a warm blanket, meal tray, coke & water. This tech will continue to monitor q 15 checks

## 2018-03-13 NOTE — Telephone Encounter (Signed)
FYI

## 2018-03-13 NOTE — Telephone Encounter (Signed)
Medication just filled 02/18/18 for 120 ,pateit should have medication until PCP in office.

## 2018-03-13 NOTE — ED Triage Notes (Signed)
PT to ED under IVC with [employer]. [facility] placed pt under IVC stating he verbalized suicidal thoughts and has a plan to overdose on multiple substances. Pt has stopped sleeping and bathing with decreased eating as well.    Pt is uncooperative in triage and refusing to dress out. Staff talked to patient for an extended amount of time before pt agreed to change. Pt requesting pain medication and a meal tray immediatly upon this RNs arrival tot he triage room.  Pts belongings include: pants, two shirts, phone, cigarettes, lighter and hat.

## 2018-03-13 NOTE — BH Assessment (Signed)
Assessment Note  Justin Andrews is an 84 y.o. male. Justin Andrews arrived to the ED by way of law enforcement. He reports that someone called the police on him. He denied symptoms of depression or anxiety.  He denied having auditory or visual hallucinations.  He denied suicidal or homicidal ideation or intent.  He reports that he resides by himself.  He denied the use of alcohol or drug use.  BAC = 90  IVC paperwork reports, "Suicidal with plan using substance, not sleeping or eating"  Patient arrived to the ED from [place] by way of law enforcement from [place]. Justin Andrews is reported from [place] as making statements to his son earlier today saying that he was going to kill himself.  He is reported as not sleeping and not eating.  HE has not been taking care of his ADLs.  He is reported as having a plan to kill himself by running into traffic or cutting his wrists. [facility] reports that the patient has a history of substance use (Opiates, cocaine, and alcohol).  TTS asked to contact his son, patient stated "I don't care", bur would not provide the son's contact information.    Justin Andrews was vague and did not appear to be forth rite with the TTS assessor.  Diagnosis: Substance induced mood disorder  Past Medical History:  Past Medical History:  Diagnosis Date  . Elevated serum creatinine   . History of melanoma   . Hypertension   . Osteoarthritis   . Tobacco abuse     Past Surgical History:  Procedure Laterality Date  . ANKLE FRACTURE SURGERY    . EXTERNAL EAR SURGERY    . INTRAMEDULLARY (IM) NAIL INTERTROCHANTERIC Left 12/01/2017   Procedure: INTRAMEDULLARY (IM) NAIL INTERTROCHANTRIC;  Surgeon: Thornton Park, MD;  Location: [facility] ORS;  Service: Orthopedics;  Laterality: Left;  . ROTATOR CUFF REPAIR    . TONSILLECTOMY    . TYMPANOPLASTY W/ MASTOIDECTOMY     Patient with reports of mastoid surgery (appears to have had surgery on TM; Left).    Family History:  Family History  Problem Relation Age of  Onset  . Prostate cancer Neg Hx   . Bladder Cancer Neg Hx   . Kidney cancer Neg Hx     Social History:  reports that he has been smoking cigarettes. He has a 70.00 pack-year smoking history. He has never used smokeless tobacco. He reports that he does not drink alcohol or use drugs.  Additional Social History:  Alcohol / Drug Use History of alcohol / drug use?: No history of alcohol / drug abuse(Patient denied use)  CIWA: CIWA-Ar BP: (!) 161/81 Pulse Rate: (!) 106 COWS:    Allergies:  Allergies  Allergen Reactions  . Aspirin Other (See Comments)    bleeding    Home Medications: (Not in a hospital admission)   OB/GYN Status:  No LMP for male patient.  General Assessment Data Location of Assessment: [facility] ED TTS Assessment: In system Is this a Tele or Face-to-Face Assessment?: Face-to-Face Is this an Initial Assessment or a Re-assessment for this encounter?: Initial Assessment Patient Accompanied by:: N/A Language Other than English: No Living Arrangements: Other (Comment)(Personal residence) What gender do you identify as?: Male Marital status: Divorced Pregnancy Status: No Living Arrangements: Group Home([facility]) Can pt return to current living arrangement?: Yes Admission Status: Involuntary Petitioner: Police Is patient capable of signing voluntary admission?: No Referral Source: Self/Family/Friend Insurance type: Medicaid/Medicare  Medical Screening Exam ([place]) Medical Exam  completed: Yes  Crisis Care Plan Living Arrangements: Group Home([facility]) Legal Guardian: Other:(self) Name of Psychiatrist: None Name of Therapist: None  Education Status Is patient currently in school?: No  Risk to self with the past 6 months Suicidal Ideation: No-Not Currently/Within Last 6 Months(Startements are reported, patient denied) Has patient been a risk to self within the past 6 months prior to admission? : Yes Suicidal Intent: (Patient denied) Has  patient had any suicidal intent within the past 6 months prior to admission? : Other (comment)(Unknown- patient is not forthrite) Is patient at risk for suicide?: (Denied by patient) Suicidal Plan?: (denied by patient) Has patient had any suicidal plan within the past 6 months prior to admission? : (Unknown) Access to Means: (Unknown) What has been your use of drugs/alcohol within the last 12 months?: denied by patient Previous Attempts/Gestures: No How many times?: 0 Other Self Harm Risks: denied Triggers for Past Attempts: None known Intentional Self Injurious Behavior: None Family Suicide History: No Recent stressful life event(s): (denied) Persecutory voices/beliefs?: No Depression: No Depression Symptoms: (denied) Substance abuse history and/or treatment for substance abuse?: No Suicide prevention information given to non-admitted patients: Not applicable  Risk to Others within the past 6 months Homicidal Ideation: No Does patient have any lifetime risk of violence toward others beyond the six months prior to admission? : No Thoughts of Harm to Others: No Current Homicidal Intent: No Current Homicidal Plan: No Access to Homicidal Means: No Identified Victim: None identified History of harm to others?: No Assessment of Violence: None Noted Violent Behavior Description: denied by patient Does patient have access to weapons?: No Criminal Charges Pending?: No Does patient have a court date: No Is patient on probation?: No  Psychosis Hallucinations: None noted Delusions: None noted  Mental Status Report Appearance/Hygiene: In scrubs Eye Contact: Good Motor Activity: Unremarkable Speech: Tangential Level of Consciousness: Alert Mood: Irritable Affect: Irritable Anxiety Level: None Thought Processes: Tangential Judgement: Partial Orientation: Appropriate for developmental age Obsessive Compulsive Thoughts/Behaviors: None  Cognitive Functioning Concentration:  Normal Memory: Recent Intact Is patient IDD: No Insight: Fair Impulse Control: Fair Appetite: Good Have you had any weight changes? : No Change Sleep: Decreased Vegetative Symptoms: None  ADLScreening [facility] Assessment Services) Patient's cognitive ability adequate to safely complete daily activities?: Yes Patient able to express need for assistance with ADLs?: No Independently performs ADLs?: Yes (appropriate for developmental age)  Prior Inpatient Therapy Prior Inpatient Therapy: No  Prior Outpatient Therapy Prior Outpatient Therapy: No Does patient have an ACCT team?: No Does patient have Intensive In-House Services?  : No Does patient have [facility] services? : No Does patient have P4CC services?: No  ADL Screening (condition at time of admission) Patient's cognitive ability adequate to safely complete daily activities?: Yes Is the patient deaf or have difficulty hearing?: No Does the patient have difficulty seeing, even when wearing glasses/contacts?: Yes Does the patient have difficulty concentrating, remembering, or making decisions?: No Patient able to express need for assistance with ADLs?: No Does the patient have difficulty dressing or bathing?: No Independently performs ADLs?: Yes (appropriate for developmental age) Does the patient have difficulty walking or climbing stairs?: Yes(Has hip issues) Weakness of Legs: Both Weakness of Arms/Hands: None  Home Assistive Devices/Equipment Home Assistive Devices/Equipment: None    Abuse/Neglect Assessment (Assessment to be complete while patient is alone) Abuse/Neglect Assessment Can Be Completed: (denied by patient)     Advance Directives (For Healthcare) Does Patient Have a Medical Advance Directive?: No Would patient like information  on creating a medical advance directive?: No - Patient declined          Disposition:  Disposition Initial Assessment Completed for this Encounter: Yes  On Site Evaluation by:    Reviewed with Physician:    Elmer Bales 03/13/2018 10:03 PM

## 2018-03-13 NOTE — ED Notes (Signed)
Pt provided 2 warm blankets

## 2018-03-13 NOTE — Telephone Encounter (Signed)
Called and spoke with Patient son he says that after eight days of receiving new script patient took 125 tablets of tramadol, patient became violent with son today when he advised him he was not getting the Tramadol that it was to early to refill. Started threatening suicide and throwing objects, patient DPR ( son) called 201 had police go out . Police advised that they were taking his father in due to he had a 1/2 gallon of vodka and was drinking with the tramadol. "That he would be admitted involuntary for 72 hours. Son ,ask please not to fill any tramadol his father needs help getting off medication and that his father is an alcoholic. "

## 2018-03-13 NOTE — ED Notes (Signed)
Pt. Resting in hallway bed #20H.  Pt. Here under IVC from [place].  Pt. Has suicidal idealization.   Pt. Given meal tray drink and blankets upon request.

## 2018-03-13 NOTE — Telephone Encounter (Signed)
Copied from [place] [id number]. Topic: Quick Communication - Rx Refill/Question >> Mar 12, 2018 10:54 AM Sheran Luz wrote: Medication:traMADol (ULTRAM) 50 MG tablet    Patient is requesting refill of this medication.   Preferred Pharmacy (with phone number or street name): [id number] [address], [place]  [phone] (Phone) [phone] (Fax) >> Mar 13, 2018  2:59 PM Leonides Schanz, Jacinto Reap wrote: Pt son called in stating pt is being taken in by the police and medical staff due to him acting out and threatening suicide. Pt son asked that the Tramadol not be refilled.

## 2018-03-13 NOTE — Telephone Encounter (Signed)
Juliann Pulse,   I wanted to get you involved here, as I believe you familiar with this patient.  What we need to  do here? Can ensure they are aware that PCP is out of office.   Is the son POA?

## 2018-03-13 NOTE — ED Notes (Signed)
Pt currently resting on hospital bed in 20Hall, pt voices no concerns at this time, will continue to monitor q 15 mins

## 2018-03-13 NOTE — ED Provider Notes (Signed)
[facility] Emergency Department Provider Note   ____________________________________________   First MD Initiated Contact with Patient 03/13/18 1920     (approximate)  I have reviewed the triage vital signs and the nursing notes.   HISTORY  Chief Complaint Suicidal    HPI Justin Andrews is a 84 y.o. male patient had hip surgery recently.  He was given tramadol.  In 8 days he is taken 125 tramadol pills and was drinking a half a gallon of vodka reportedly.  When his son refused to refill the prescription he began throwing things and threatening suicide.  Nurses reported he smelled heavily of marijuana when he arrived.  He has not been bathing or eating either.        Past Medical History:  Diagnosis Date  . Elevated serum creatinine   . History of melanoma   . Hypertension   . Osteoarthritis   . Tobacco abuse     Patient Active Problem List   Diagnosis Date Noted  . Protein-calorie malnutrition, severe 12/03/2017  . Closed left hip fracture ([place]) 11/30/2017  . Diarrhea 11/15/2017  . Duodenum disorder 11/15/2017  . Frequency of urination 10/21/2017  . Cigarette smoker 10/06/2017  . Cough 09/25/2017  . Changes in vision 09/25/2017  . Proteinuria 09/25/2017  . Basal cell carcinoma 02/28/2017  . 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 DOE (dyspnea on exertion) 01/17/2017  . Chronic right upper quadrant pain 11/15/2016  . Balance problem 11/15/2016  . Simple renal cyst 10/21/2016  . Prostate nodule 10/16/2016  . Dysuria 10/16/2016  . CKD (chronic kidney disease) stage 3, GFR 30-59 ml/min (HCC) 08/05/2016  . Anemia 08/05/2016  . Anxiety and depression 08/05/2016  . Weight loss 08/05/2016  . Sleep disturbance 07/23/2016  . Hypertension   . Osteoarthritis   . Tobacco abuse   . 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 ANKLE FRACTURE SURGERY    . EXTERNAL EAR SURGERY      . INTRAMEDULLARY (IM) NAIL INTERTROCHANTERIC Left 12/01/2017   Procedure: INTRAMEDULLARY (IM) NAIL INTERTROCHANTRIC;  Surgeon: Thornton Park, MD;  Location: [facility] ORS;  Service: Orthopedics;  Laterality: Left;  . ROTATOR CUFF REPAIR    . TONSILLECTOMY    . TYMPANOPLASTY W/ MASTOIDECTOMY     Patient with reports of mastoid surgery (appears to have had surgery on TM; Left).    Prior to Admission medications   Medication Sig Start Date End Date Taking? Authorizing Provider  amLODipine (NORVASC) 10 MG tablet Take 1 tablet (10 mg total) by mouth daily. 09/24/17   Leone Haven, MD  calcium-vitamin D (OSCAL WITH D) 500-200 MG-UNIT tablet Take 1 tablet by mouth 2 (two) times daily. 12/03/17   Fritzi Mandes, MD  carvedilol (COREG) 6.25 MG tablet Take 2 tablets (12.5 mg total) by mouth 2 (two) times daily with a meal. 12/03/17   Fritzi Mandes, MD  [facility] VITAMIN B12 1000 MCG tablet TAKE 1 TABLET BY MOUTH EVERY DAY Patient taking differently: Take 1,000 mcg by mouth daily.  11/19/17   Leone Haven, MD  feeding supplement, ENSURE ENLIVE, (ENSURE ENLIVE) LIQD Take 237 mLs by mouth 2 (two) times daily between meals. 12/03/17   Fritzi Mandes, MD  ferrous sulfate 325 (65 FE) MG tablet Take 1 tablet (325 mg total) by mouth 2 (two) times daily. 10/17/16   Leone Haven, MD  folic acid (FOLVITE) 1 MG tablet TAKE 1  TABLET BY MOUTH EVERY DAY 02/05/17   Leone Haven, MD  gabapentin (NEURONTIN) 100 MG capsule Take 2 capsules (200 mg total) by mouth 3 (three) times daily. 12/03/17   Fritzi Mandes, MD  Multiple Vitamin (MULTIVITAMIN WITH MINERALS) TABS tablet Take 1 tablet by mouth daily. 12/03/17   Fritzi Mandes, MD  ranitidine (ZANTAC) 150 MG tablet Take 150 mg by mouth 2 (two) times daily.    [provider]  sertraline (ZOLOFT) 100 MG tablet TAKE 1.5 TABLETS (150 MG TOTAL) BY MOUTH DAILY. 06/23/17   Leone Haven, MD  tamsulosin (FLOMAX) 0.4 MG CAPS capsule Take 1 capsule (0.4 mg total) by  mouth daily. 10/25/17   Leone Haven, MD  thiamine 100 MG tablet Take 1 tablet (100 mg total) by mouth daily. 12/03/17   Fritzi Mandes, MD  traMADol (ULTRAM) 50 MG tablet Take 1 tablet (50 mg total) by mouth every 6 (six) hours as needed for moderate pain. 02/18/18   Leone Haven, MD  vitamin C (VITAMIN C) 250 MG tablet Take 1 tablet (250 mg total) by mouth 2 (two) times daily. 12/03/17   Fritzi Mandes, MD    Allergies Aspirin  Family History  Problem Relation Age of Onset  . Prostate cancer Neg Hx   . Bladder Cancer Neg Hx   . Kidney cancer Neg Hx     Social History Social History   Tobacco Use  . Smoking status: Current Every Day Smoker    Packs/day: 1.00    Years: 70.00    Pack years: 70.00    Types: Cigarettes  . Smokeless tobacco: Never Used  Substance Use Topics  . Alcohol use: No  . Drug use: No    Review of Systems  Constitutional: No fever/chills Eyes: No visual changes. ENT: No sore throat. Cardiovascular: Denies chest pain. Respiratory: Denies shortness of breath. Gastrointestinal: No abdominal pain.  No nausea, no vomiting.  No diarrhea.  No constipation. Genitourinary: Negative for dysuria. Musculoskeletal: Negative for back pain. Skin: Negative for rash. Neurological: Negative for headaches, focal weakness  ____________________________________________   PHYSICAL EXAM:  VITAL SIGNS: ED Triage Vitals  Enc Vitals Group     BP 03/13/18 1916 (!) 161/81     Pulse Rate 03/13/18 1916 (!) 106     Resp 03/13/18 1916 16     Temp 03/13/18 1916 97.7 F (36.5 C)     Temp src --      SpO2 03/13/18 1916 98 %     Weight 03/13/18 1912 132 lb 0.9 oz (59.9 kg)     Height 03/13/18 1916 5\' 8"  (1.727 m)     Head Circumference --      Peak Flow --      Pain Score 03/13/18 1916 10     Pain Loc --      Pain Edu? --      Excl. in [place]? --     Constitutional: Alert and oriented. Well appearing and in no acute distress. Eyes: Conjunctivae are normal.  Head:  Atraumatic. Nose: No congestion/rhinnorhea. Mouth/Throat: Mucous membranes are moist.  Oropharynx non-erythematous. Neck: No stridor.  Cardiovascular: Normal rate, regular rhythm. Grossly normal heart sounds.  Good peripheral circulation. Respiratory: Normal respiratory effort.  No retractions. Lungs CTAB. Gastrointestinal: Soft and nontender. No distention. No abdominal bruits. Musculoskeletal: No lower extremity tenderness nor edema. Neurologic:  Normal speech and language. No gross focal neurologic deficits are appreciated. No gait instability. Skin:  Skin is warm, dry and intact. No  rash noted.   ____________________________________________   LABS (all labs ordered are listed, but only abnormal results are displayed)  Labs Reviewed  COMPREHENSIVE METABOLIC PANEL  ETHANOL  SALICYLATE LEVEL  ACETAMINOPHEN LEVEL  CBC  URINE DRUG SCREEN, QUALITATIVE ([place])   ____________________________________________  EKG   ____________________________________________  [id number]  ED MD interpretation:   Official radiology report(s): No results found.  ____________________________________________   PROCEDURES  Procedure(s) performed (including Critical Care):  Procedures   ____________________________________________   INITIAL IMPRESSION / ASSESSMENT AND PLAN / ED COURSE               ____________________________________________   FINAL CLINICAL IMPRESSION(S) / ED DIAGNOSES  Final diagnoses:  Suicidal ideation     ED Discharge Orders    None       Note:  This document was prepared using Dragon voice recognition software and may include unintentional dictation errors.    Nena Polio, MD 03/13/18 [phone]

## 2018-03-14 LAB — URINE DRUG SCREEN, QUALITATIVE (ARMC ONLY)
Amphetamines, Ur Screen: NOT DETECTED
Barbiturates, Ur Screen: NOT DETECTED
Benzodiazepine, Ur Scrn: NOT DETECTED
Cannabinoid 50 Ng, Ur ~~LOC~~: NOT DETECTED
Cocaine Metabolite,Ur ~~LOC~~: NOT DETECTED
MDMA (Ecstasy)Ur Screen: NOT DETECTED
Methadone Scn, Ur: NOT DETECTED
Opiate, Ur Screen: NOT DETECTED
Phencyclidine (PCP) Ur S: NOT DETECTED
Tricyclic, Ur Screen: NOT DETECTED

## 2018-03-14 MED ORDER — SERTRALINE HCL 50 MG PO TABS
150.0000 mg | ORAL_TABLET | Freq: Every day | ORAL | Status: DC
  Filled 2018-03-14: qty 3

## 2018-03-14 MED ORDER — CARVEDILOL 6.25 MG PO TABS: 12.5000 mg | ORAL_TABLET | Freq: Two times a day (BID) | ORAL | Status: DC

## 2018-03-14 MED ORDER — GABAPENTIN 100 MG PO CAPS
200.0000 mg | ORAL_CAPSULE | Freq: Once | ORAL | Status: AC
  Administered 2018-03-14: 200 mg via ORAL
  Filled 2018-03-14 (×2): qty 2

## 2018-03-14 MED ORDER — AMLODIPINE BESYLATE 5 MG PO TABS: 10.0000 mg | ORAL_TABLET | Freq: Every day | ORAL | Status: DC

## 2018-03-14 MED ORDER — HYDROXYZINE HCL 25 MG PO TABS
50.0000 mg | ORAL_TABLET | Freq: Once | ORAL | Status: AC
  Administered 2018-03-14: 50 mg via ORAL
  Filled 2018-03-14: qty 2

## 2018-03-14 MED ORDER — FAMOTIDINE 20 MG PO TABS: 10.0000 mg | ORAL_TABLET | Freq: Every day | ORAL | Status: DC

## 2018-03-14 MED ORDER — NICOTINE 21 MG/24HR TD PT24
21.0000 mg | MEDICATED_PATCH | Freq: Once | TRANSDERMAL | Status: DC
  Filled 2018-03-14: qty 1

## 2018-03-14 MED ORDER — SERTRALINE HCL 50 MG PO TABS: 150.0000 mg | ORAL_TABLET | Freq: Every day | ORAL | Status: DC

## 2018-03-14 MED ORDER — GABAPENTIN 100 MG PO CAPS
200.0000 mg | ORAL_CAPSULE | Freq: Three times a day (TID) | ORAL | Status: DC
  Filled 2018-03-14 (×2): qty 2

## 2018-03-14 NOTE — ED Notes (Signed)
Pt requested a nicotine patch but once received the pt refused it stating he wants to go outside to smoke. Attempted to explain but pt is hard of hearing and believes he will be dicharged soon.

## 2018-03-14 NOTE — ED Notes (Signed)
Pt. Up using bathroom.

## 2018-03-14 NOTE — ED Notes (Signed)
Report received - pt to see psychatrist today (they are on site).

## 2018-03-14 NOTE — ED Notes (Addendum)
Per soc they are going to discharge patient back to the group home and reverse ivc. aps already has a report filed in regards to the living conditions (pt had stated he lives in a group home, but its actually a group of friends living in the same home).  tts will call the son regarding discharge and pick up.

## 2018-03-14 NOTE — ED Notes (Signed)
Son arrived to take pt home. Pt getting dressed.

## 2018-03-14 NOTE — Progress Notes (Signed)
LCSW received a call from weekend coverage worker [facility] that an APS report has been already screened for this patient and SW does not need to call to make a new report.  [employer] LCSW [phone]

## 2018-03-14 NOTE — ED Notes (Signed)
Repeated attempts to get the patient to go to sleep have failed.  Pt. Continues to ask for pepsi and other requests this nurse can't give patient.  Repeated attempts to give pt. Water or other drinks, pt. Spills on floor or in bed and then states "You have to clean this up or I will slip and fall.

## 2018-03-14 NOTE — BH Assessment (Signed)
TTS called pt's son Zackari Ruane: [phone]) to inquire if he can pick-up pt at the time of discharge.  Pt's son reports "he needs to stay there and get help and if he wants to leave he can leave - I will not be able to pick him up. I am going out of town and I will not be back until tomorrow". Pt's son expressed his frustration with his father's recent behaviors and substance use.  By the end of the call his son agreed to pick pt up within an hour.  This information was relayed to pt's nurse.

## 2018-03-14 NOTE — ED Notes (Signed)
Patient is complaining of restless leg syndrome.  MD notified.

## 2018-03-14 NOTE — ED Notes (Signed)
Soc computer placed in room. Pt is hard of hearing so unsure if pt will be able to understand them.

## 2018-03-14 NOTE — ED Provider Notes (Signed)
.-----------------------------------------   4:46 PM on 03/14/2018 -----------------------------------------   Blood pressure (!) 167/85, pulse 87, temperature 97.7 F (36.5 C), resp. rate 18, height 5\' 8"  (1.727 m), weight 59.4 kg, SpO2 99 %.  The patient is calm and cooperative at this time.  There have been no acute events since the last update.  Awaiting disposition plan from Behavioral Medicine team.Psychiatry consult note reviewed which does find the patient be psychiatrically clear.  At this point he is clinically sober, not suicidal or homicidal, no hallucinations.  Stable for outpatient follow-up.  IVC was reversed by the psychiatrist.  No medication change recommendations at this point   Carrie Mew, MD 03/14/18 [phone]

## 2018-03-14 NOTE — Discharge Instructions (Signed)
Please follow-up with your doctor and outpatient mental health provider as soon as possible for continued evaluation of your symptoms.

## 2018-03-14 NOTE — ED Provider Notes (Signed)
-----------------------------------------   6:40 AM on 03/14/2018 -----------------------------------------   Blood pressure (!) 161/81, pulse (!) 106, temperature 97.7 F (36.5 C), resp. rate 16, height 5\' 8"  (1.727 m), weight 59.4 kg, SpO2 98 %.  The patient is calm and cooperative at this time.  There have been no acute events since the last update.  Awaiting disposition plan from Behavioral Medicine team.   Paulette Blanch, MD 03/14/18 [phone]

## 2018-03-14 NOTE — ED Notes (Signed)
Pt. Came out of room, pt. Redirected back to room.

## 2018-03-15 NOTE — Telephone Encounter (Signed)
He needs to be seen in the office to discuss this medication. It appears he has been taking excessive amounts of this. Please see more recent phone and ED visit. Please contact him to get him scheduled. Thanks.

## 2018-03-15 NOTE — Telephone Encounter (Signed)
Noted. Please follow-up with the patient or his son to see how he is doing now. Please offer him follow-up with me in the office this week. Needs to be in a 30 minute time slot. Thanks.

## 2018-03-16 NOTE — Telephone Encounter (Signed)
Called pt's son and left a VM to call back. CRM created and sent to [facility] pool.

## 2018-03-16 NOTE — Telephone Encounter (Signed)
See below

## 2018-03-17 NOTE — Telephone Encounter (Signed)
Tried to reach patient voicemail is full no answer.

## 2018-03-17 NOTE — Telephone Encounter (Signed)
Called and spoke with patient. Pt stated that he is doing "good". Pt is scheduled for an appt 3/11 in a 30 min slot with PCP. Sent to PCP as an [place].

## 2018-03-18 ENCOUNTER — Ambulatory Visit (INDEPENDENT_AMBULATORY_CARE_PROVIDER_SITE_OTHER): Payer: Medicare HMO | Admitting: Family Medicine

## 2018-03-18 ENCOUNTER — Encounter: Payer: Self-pay | Admitting: Family Medicine

## 2018-03-18 ENCOUNTER — Other Ambulatory Visit: Payer: Self-pay

## 2018-03-18 VITALS — BP 130/66 | HR 104 | Temp 97.9°F | Ht 68.0 in | Wt 130.6 lb

## 2018-03-18 DIAGNOSIS — M150 Primary generalized (osteo)arthritis: Secondary | ICD-10-CM

## 2018-03-18 DIAGNOSIS — N183 Chronic kidney disease, stage 3 unspecified: Secondary | ICD-10-CM

## 2018-03-18 DIAGNOSIS — H66002 Acute suppurative otitis media without spontaneous rupture of ear drum, left ear: Secondary | ICD-10-CM | POA: Diagnosis not present

## 2018-03-18 DIAGNOSIS — M8949 Other hypertrophic osteoarthropathy, multiple sites: Secondary | ICD-10-CM

## 2018-03-18 DIAGNOSIS — R69 Illness, unspecified: Secondary | ICD-10-CM | POA: Diagnosis not present

## 2018-03-18 DIAGNOSIS — M159 Polyosteoarthritis, unspecified: Secondary | ICD-10-CM

## 2018-03-18 DIAGNOSIS — R45851 Suicidal ideations: Secondary | ICD-10-CM

## 2018-03-18 DIAGNOSIS — C4322 Malignant melanoma of left ear and external auricular canal: Secondary | ICD-10-CM | POA: Diagnosis not present

## 2018-03-18 MED ORDER — AMOXICILLIN-POT CLAVULANATE 875-125 MG PO TABS: 1.0000 | ORAL_TABLET | Freq: Two times a day (BID) | ORAL | 0 refills | Status: DC

## 2018-03-18 MED ORDER — AMOXICILLIN-POT CLAVULANATE 500-125 MG PO TABS: 1.0000 | ORAL_TABLET | Freq: Three times a day (TID) | ORAL | 0 refills | Status: DC

## 2018-03-18 NOTE — Patient Instructions (Addendum)
Nice to see you. We will treat you for an ear infection. You should try to cut down on drinking alcohol though do not cut this out all of the sudden.  I will need to consider your pain regimen. We will contact you when I make a decision.

## 2018-03-19 ENCOUNTER — Telehealth: Payer: Self-pay | Admitting: Family Medicine

## 2018-03-19 DIAGNOSIS — H6690 Otitis media, unspecified, unspecified ear: Secondary | ICD-10-CM | POA: Insufficient documentation

## 2018-03-19 DIAGNOSIS — R45851 Suicidal ideations: Secondary | ICD-10-CM | POA: Insufficient documentation

## 2018-03-19 LAB — BASIC METABOLIC PANEL
BUN: 35 mg/dL — ABNORMAL HIGH (ref 6–23)
CO2: 25 mEq/L (ref 19–32)
Calcium: 9.7 mg/dL (ref 8.4–10.5)
Chloride: 99 mEq/L (ref 96–112)
Creatinine, Ser: 1.85 mg/dL — ABNORMAL HIGH (ref 0.40–1.50)
GFR: 35.16 mL/min — ABNORMAL LOW (ref >60.00)
Glucose, Bld: 131 mg/dL — ABNORMAL HIGH (ref 70–99)
Potassium: 4.2 mEq/L (ref 3.5–5.1)
Sodium: 137 mEq/L (ref 135–145)

## 2018-03-19 MED ORDER — AMOXICILLIN-POT CLAVULANATE 500-125 MG PO TABS: 1.0000 | ORAL_TABLET | Freq: Two times a day (BID) | ORAL | 0 refills | Status: DC

## 2018-03-19 MED ORDER — LIDOCAINE 5 % EX PTCH: 1.0000 | MEDICATED_PATCH | CUTANEOUS | 0 refills | Status: DC

## 2018-03-19 NOTE — Telephone Encounter (Signed)
Called and spoke to the patient regarding his pain regimen. Advised that I would not prescribe his tramadol anymore. We will treat with lidocaine patches. This was sent to the patients pharmacy.

## 2018-03-19 NOTE — Assessment & Plan Note (Signed)
Patient with otitis media on the left.  No apparent rupture of the tympanic membrane.  We will treat with Augmentin that is renally dosed.  Initially the 875-125 mg Augmentin was sent to the pharmacy though this was canceled by the [place] and the appropriate dose was sent to the pharmacy.

## 2018-03-19 NOTE — Assessment & Plan Note (Signed)
Patient has a chronic issue with osteoarthritic pain and left hip pain following fracture.  He is unable to take anti-inflammatories given chronic kidney disease.  He has been abusing his tramadol and drinking alcohol during this timeframe.  I discussed with the patient that given this I would not be able to continue to fill his tramadol as it would be unsafe and place him at risk.  He noted he did not typically drink alcohol though has been doing so to control his pain.  I discussed with the patient the risk of excessive tramadol use and his alcohol use and the combination of these things.  I discussed the risk of overdose with this combination or with excessive tramadol use.  I discussed that I would have to consider his medication regimen for treatment of his pain and advised him that I would contact him the day following his visit with my decision regarding treatment for his pain.  He voiced understanding.

## 2018-03-19 NOTE — Assessment & Plan Note (Addendum)
Patient denies depression, suicidal ideation, and homicidal ideation at this time.  He was evaluated by psychiatry and they felt he did not meet criteria for IVC.  The patient will continue with Zoloft.  He was advised that if he develop suicidal ideation he should seek medical attention immediately in the emergency room.

## 2018-03-19 NOTE — Progress Notes (Signed)
[name], MD Phone: [phone]  Justin Andrews is a 84 y.o. male who presents today for f/u.  Suicidal ideation: Patient was taken to the ED under IVC.  He was referred to the emergency department by [facility] secondary to suicidal ideation.  It is documented in the ED physician note that the patient had taken 125 tramadol pills in 8 days and was drinking half a gallon of vodka.  They noted when his son refused to refill the prescription of tramadol the patient began throwing things and threatening suicide.  He was evaluated by psychiatry and noted to have no evidence of elicited or exhibited suicidal ideation or homicidal ideation.  They felt he did not meet criteria for IVC and was psychiatrically cleared for discharge to outpatient follow-up.  They noted the patient denied drinking alcohol frequently.  His alcohol level was 90 mg/dL on admission to the ED.  Today the patient notes he has no depression, suicidal ideation, or homicidal ideation.  He has been without tramadol since 03/15/2018.  He noted he had been taking tramadol in excess of the 8 pills that were prescribed previously though though his most recent prescription was for 4 pills daily.  He notes he has been drinking what appears on his demonstration to be 2 fingers width of alcohol every 4-5 hours recently.  He notes he does this to help with his pain.  The patient has not followed with a psychiatrist despite previously being advised to do this.  Left ear draining: Patient notes his left ear has been draining for several days.  He has had no pain or fevers.  He wonders if it is infected.  Melanoma: Patient has not yet seen dermatology.  He understands that his untreated melanoma could be spreading.  He states he is going to go by the dermatologist office today.  We have made multiple appointments for the patient to see dermatology and he is yet to follow-up with them.  Social History   Tobacco Use  Smoking Status Current Every Day  Smoker  . Packs/day: 1.00  . Years: 70.00  . Pack years: 70.00  . Types: Cigarettes  Smokeless Tobacco Never Used     ROS see history of present illness  Objective  Physical Exam Vitals:   03/18/18 1446 03/18/18 1520  BP: (!) 152/70 130/66  Pulse: (!) 104   Temp: 97.9 F (36.6 C)   SpO2: 99%     BP Readings from Last 3 Encounters:  03/18/18 130/66  03/14/18 (!) 170/93  01/16/18 (!) 122/58   Wt Readings from Last 3 Encounters:  03/18/18 130 lb 9.6 oz (59.2 kg)  03/13/18 131 lb (59.4 kg)  01/16/18 132 lb (59.9 kg)    Physical Exam Constitutional:      General: He is not in acute distress.    Appearance: He is not diaphoretic.  HENT:     Right Ear: Tympanic membrane normal.     Ears:     Comments: Left TM erythematous and bulging with purulence behind the TM    Mouth/Throat:     Mouth: Mucous membranes are moist.     Pharynx: Oropharynx is clear.  Eyes:     Conjunctiva/sclera: Conjunctivae normal.     Pupils: Pupils are equal, round, and reactive to light.  Cardiovascular:     Rate and Rhythm: Normal rate and regular rhythm.     Heart sounds: Normal heart sounds.  Pulmonary:     Effort: Pulmonary effort is normal.  Breath sounds: Normal breath sounds.  Musculoskeletal:     Comments: Slight tenderness over the left hip, skin with no signs of infection, incisions are well-healed  Skin:    General: Skin is warm and dry.  Neurological:     Mental Status: He is alert.  Psychiatric:        Mood and Affect: Mood normal.      Assessment/Plan: Please see individual problem list.  Malignant melanoma of left ear [facility]) Patient has been noncompliant with following up with dermatology for surgery regarding his melanoma.  He noted he would contact them the day of his visit to set up an appointment.  He understands the risk of spread and untreated melanoma spreading to the rest of his body resulting in death.  Osteoarthritis Patient has a chronic issue with  osteoarthritic pain and left hip pain following fracture.  He is unable to take anti-inflammatories given chronic kidney disease.  He has been abusing his tramadol and drinking alcohol during this timeframe.  I discussed with the patient that given this I would not be able to continue to fill his tramadol as it would be unsafe and place him at risk.  He noted he did not typically drink alcohol though has been doing so to control his pain.  I discussed with the patient the risk of excessive tramadol use and his alcohol use and the combination of these things.  I discussed the risk of overdose with this combination or with excessive tramadol use.  I discussed that I would have to consider his medication regimen for treatment of his pain and advised him that I would contact him the day following his visit with my decision regarding treatment for his pain.  He voiced understanding.  Otitis media Patient with otitis media on the left.  No apparent rupture of the tympanic membrane.  We will treat with Augmentin that is renally dosed.  Initially the 875-125 mg Augmentin was sent to the pharmacy though this was canceled by the [place] and the appropriate dose was sent to the pharmacy.  Suicidal ideation Patient denies depression, suicidal ideation, and homicidal ideation at this time.  He was evaluated by psychiatry and they felt he did not meet criteria for IVC.  The patient will continue with Zoloft.  He was advised that if he develop suicidal ideation he should seek medical attention immediately in the emergency room.   Orders Placed This Encounter  Procedures  . Basic Metabolic Panel (BMET)    Meds ordered this encounter  Medications  . DISCONTD: amoxicillin-clavulanate (AUGMENTIN) 875-125 MG tablet    Sig: Take 1 tablet by mouth 2 (two) times daily.    Dispense:  14 tablet    Refill:  0  . DISCONTD: amoxicillin-clavulanate (AUGMENTIN) 500-125 MG tablet    Sig: Take 1 tablet (500 mg total) by mouth 3  (three) times daily.    Dispense:  14 tablet    Refill:  0  . lidocaine (LIDODERM) 5 %    Sig: Place 1 patch onto the skin daily. Remove & Discard patch within 12 hours.    Dispense:  10 patch    Refill:  0  . amoxicillin-clavulanate (AUGMENTIN) 500-125 MG tablet    Sig: Take 1 tablet (500 mg total) by mouth 2 (two) times daily.    Dispense:  14 tablet    Refill:  0   Second Augmentin prescription sent in incorrectly with directions to take 1 tablet 3 times daily.  I contacted  the pharmacy on 03/19/2018 and the patient had not yet picked the medication up.  I advised that they should change the directions to take 1 tablet by mouth twice daily and still dispense 14 tablets.  The pharmacy noted that they would make this change.  [name], MD [place]

## 2018-03-19 NOTE — Telephone Encounter (Signed)
Copied from [place] [id number]. Topic: Quick Communication - Rx Refill/Question >> Mar 19, 2018  2:55 PM Scherrie Gerlach wrote: Medication: traMADol (ULTRAM) 50 MG tablet  Pt states he thought Dr Josephina Gip was going to fill his pain med yesterday and it is not at the pharmacy. Would like that called into [id number] [address], [place] [phone] (Phone) [phone] (Fax)

## 2018-03-19 NOTE — Assessment & Plan Note (Addendum)
Patient has been noncompliant with following up with dermatology for surgery regarding his melanoma.  He noted he would contact them the day of his visit to set up an appointment.  He understands the risk of spread and untreated melanoma spreading to the rest of his body resulting in death.

## 2018-03-20 ENCOUNTER — Telehealth: Payer: Self-pay

## 2018-03-20 DIAGNOSIS — S728X9D Other fracture of unspecified femur, subsequent encounter for closed fracture with routine healing: Secondary | ICD-10-CM

## 2018-03-20 NOTE — Telephone Encounter (Signed)
Copied from [place] [id number]. Topic: General - Other >> Mar 20, 2018 12:42 PM Percell Belt A wrote: Reason for CRM:  Pt called and stated he would like a call back from his dr.  He would not give me any other info.

## 2018-03-20 NOTE — Telephone Encounter (Signed)
Mailbox full

## 2018-03-20 NOTE — Telephone Encounter (Signed)
Pt is calling back to discuss lab results.

## 2018-03-20 NOTE — Telephone Encounter (Signed)
Justin Andrews (Key: [id number])   [employer] has not yet replied to your PA request. You may close this dialog, return to your dashboard, and perform other tasks. To check for an update later, open this request again from your dashboard. If [employer] has not replied to your request within 24 hours please contact [employer] at [phone].

## 2018-03-20 NOTE — Telephone Encounter (Signed)
Called pt and was unable to leave a VM to call back.

## 2018-03-23 NOTE — Telephone Encounter (Signed)
Pt would like Dr Josephina Gip to call him back again.  He states it is about his health.  Also states his pain medicine is critical.

## 2018-03-24 ENCOUNTER — Telehealth: Payer: Self-pay

## 2018-03-24 NOTE — Telephone Encounter (Addendum)
I am no longer going to prescribe his tramadol due to his excessive use of this in combination with his alcohol intake. I discussed the risks of this with him during his visit and discussed that it would be unsafe for him to continue on this regimen. I am going to work on finding a suitable alternative for his pain. Has he been able to get the lidocaine patches? If he has not been able to get these we could try sending in voltaren gel for him to use on his hips. I could also refer him to a pain specialist for evaluation as well. If he would like this I can place a referral. I am additionally going to send this message to our clinical pharmacist to see if there is a medication such as cymbalta or similar medication that we could use to treat the patients pain. If he is in severe pain he should go to ED for evaluation. Please also determine if he was able to pick up the antibiotic for his ear infection. Please also see if he was able to schedule an appointment with dermatology as we discussed during his visit.

## 2018-03-24 NOTE — Telephone Encounter (Signed)
Copied from [place] [id number]. Topic: General - Other >> Mar 24, 2018 11:09 AM Yvette Rack wrote: Reason for CRM: Pt returned call to office. Pt requests call back. Cb# [phone] >> Mar 24, 2018  2:10 PM Gustavus Messing wrote: Patient called back again trying to reach Butch Penny because he does not know why she is calling

## 2018-03-24 NOTE — Telephone Encounter (Signed)
Copied from [place] [id number]. Topic: General - Other >> Mar 24, 2018 11:09 AM Yvette Rack wrote: Reason for CRM: Pt returned call to office. Pt requests call back. Cb# [phone]

## 2018-03-24 NOTE — Telephone Encounter (Signed)
Called pt and was unable to leave a VM to call back due to mailbox being full.

## 2018-03-24 NOTE — Telephone Encounter (Signed)
Called and spoke with pt. Pt stated that he has not slept in 4 nights and haven't eating in 3 day, pt is wanting pain medication due to hip pain. Requesting tramadol. Pt is c/o arthritis pain stated that it is terrible he is unable to grip anything and he lives alone.   Per, Caryl Bis MD " Called and spoke to the patient regarding his pain regimen. Advised that I would not prescribe his tramadol anymore. We will treat with lidocaine patches. This was sent to the patients pharmacy"  Advised pt what was last document per PCP pt advised and voiced understanding but he still wanted me to send PCP this message to consistor.

## 2018-03-25 ENCOUNTER — Telehealth: Payer: Self-pay | Admitting: Family Medicine

## 2018-03-25 MED ORDER — VENLAFAXINE HCL ER 37.5 MG PO CP24: ORAL_CAPSULE | ORAL | 1 refills | Status: DC

## 2018-03-25 NOTE — Telephone Encounter (Signed)
Please see my prior message and additionally let the patient know I heard back from our pharmacist with a recommendation to trial venlafaxine for his pain. I would like for him to decrease his sertraline to 100 mg daily for one week, then he will decrease to sertraline 50 mg daily for one week and start on venlafaxine 37.5 mg daily for one week, then he will stop the sertraline and we will increase the venlafaxine to 75 mg daily. If he is willing to make this change I can send this to his pharmacy.

## 2018-03-25 NOTE — Telephone Encounter (Signed)
Copied from [place] #[id number]. Topic: Quick Communication - See Telephone Encounter >> Mar 25, 2018  4:44 PM Rutherford Nail, NT wrote: CRM for notification. See Telephone encounter for: 03/25/18. Patient calling and is requesting a call from Dr Caryl Bis. States that it is regarding his "general health." Would not go into details. Please advise.

## 2018-03-25 NOTE — Addendum Note (Signed)
Addended by: Leone Haven on: 03/25/2018 10:51 AM   Modules accepted: Orders

## 2018-03-25 NOTE — Telephone Encounter (Signed)
Sent venlafaxine to the pharmacy. Please confirm what issue he has had with aspirin in the past. What kind of bleeding issue did he have with aspirin? I need to know this prior to sending in the voltaren gel.

## 2018-03-25 NOTE — Telephone Encounter (Signed)
I have placed a home health order for a social worker to come and evaluate him for placement. I will forward this message to [facility] as well so she can send that to try to be completed today or tomorrow for the patient. If he is in severe pain he should go to the ED for evaluation. I am also discussing appropriate treatment regimens with our pharmacist to determine what would be best to place him on given his kidney function. We will contact him once I hear back from her regarding an appropriate treatment regimen. Please additionally find out what issue he had when taking aspirin previously. What kind of bleeding did he have? Depending on his answer I can prescribe a topical medication for him to use on his hip.

## 2018-03-25 NOTE — Telephone Encounter (Signed)
Yes pt stated that with the Asprin it casused his intestines to bleed.

## 2018-03-25 NOTE — Addendum Note (Signed)
Addended by: Caryl Bis, ERIC G on: 03/25/2018 11:15 AM   Modules accepted: Orders

## 2018-03-25 NOTE — Telephone Encounter (Signed)
Called and spoke with pt. Pt advised and voiced understanding. Pt would like to try the venlafaxine please sent this into pt's pharmacy with taper instruction asked pt to grab a pen and paper but stated that he was too exhausted to do that.  Pt is aware that we plan to help him set up with home health assistance. Pt is aware that he will need to go to ED if in serious pain. Please also send in Voltaren cream for hip pain.   Sent to PCP

## 2018-03-25 NOTE — Telephone Encounter (Signed)
Could consider venlafaxine instead of duloxetine, his renal function just limits the maximum dose we could achieve.   Would recommend decreasing sertraline to 100 x 1 week, then 50 mg while starting venlafaxine 37.5 mg x 1 week, then stop sertraline and increase venlafaxine to 75 mg daily. Continue this for a few weeks to determine if dose increase is needed.   Would limit max dose to 150 mg daily (4 37.5 mg capsules) given level of renal dysfunction.   Catie Darnelle Maffucci, PharmD, [place] PGY2 Ambulatory Care Pharmacy Resident, [facility] Phone: [phone]

## 2018-03-25 NOTE — Telephone Encounter (Signed)
Called and spoke with patient. Pt was advised of the following information.   Pt stated that he did pick up the antibiotic yesterday for the ear infection. Pt has not set up an appt with the dermatologist doctor. Pt is requesting to go somewhere for assisted living. Pt stated that he is very old and he just can manage on his own. Pt was not able to get the lidocaine patches they were too expensive for him to afford.   Pt stated that Dr. Caryl Bis " threw him under the bus by not prescribing the TRAMADOL pt stated that he can't continue on without the TRAMADOL due to him pain"  Pt has been advised that the tramadol is NOT a safe option for him due to being over used and taking medication while drinking alcohol  Pt pleaded to  Be sent someone where for ASSISTED living.

## 2018-03-26 MED ORDER — DICLOFENAC SODIUM 1 % TD GEL: 2.0000 g | Freq: Four times a day (QID) | TRANSDERMAL | 0 refills | Status: DC | PRN

## 2018-03-26 MED ORDER — VENLAFAXINE HCL ER 37.5 MG PO CP24: ORAL_CAPSULE | ORAL | 1 refills | Status: DC

## 2018-03-26 NOTE — Telephone Encounter (Signed)
Called pt was unable to leave a VM to call back.

## 2018-03-26 NOTE — Telephone Encounter (Signed)
[facility] stated that [facility] would require for a PA due to two capsules a day insurance will only cover 1 capsule a day.   Called pt and was unable to leave a VM to call back.

## 2018-03-26 NOTE — Telephone Encounter (Signed)
Sent to [facility]. Voltaren gel sent to pharmacy as well. Discussed this medication with clinical pharmacist and they noted that this should be ok with the patients renal function.

## 2018-03-26 NOTE — Telephone Encounter (Signed)
See other phone note

## 2018-03-26 NOTE — Addendum Note (Signed)
Addended by: Leone Haven on: 03/26/2018 05:29 PM   Modules accepted: Orders

## 2018-03-26 NOTE — Telephone Encounter (Signed)
Sent to PCP ?

## 2018-03-26 NOTE — Telephone Encounter (Signed)
Spoke with patient.  He noted that he was unable to afford the lidocaine patches and the venlafaxine.  He notes the venlafaxine was $200.  He notes if insurance does not pay for it he will be unable to afford it.  He notes quite a bit of discomfort that has been discussed previously during his office visit with his arthritic pain in his hands and his hip pain following hip surgery.  He requested that we start him back on tramadol.  I discussed with him the risk of the tramadol particularly given that he was taking an excessive amount and that he was drinking alcohol.  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  He then noted that the last time he drank was the day of his office visit with me.  I discussed that the tramadol is not an option at this time given his excessive use of it in the past and given his alcohol use.  I discussed that we would try to find an alternative medication for him to take for his pain.  I will have [facility] contact the patient's pharmacy to determine the accurate cost of the venlafaxine.  I have also sent a message to our clinical pharmacist to see if there is another alternative that would be cheaper.  The patient noted he had not been to see dermatology yet he has not scheduled an appointment.  I discussed the importance of this given that he has melanoma that has not been treated and discussed that this could spread and end up killing him.  He verbalized understanding.  He also noted that he was going to follow-up with his orthopedist who did the hip surgery that he had previously.

## 2018-03-26 NOTE — Telephone Encounter (Signed)
Called and spoke with pt. Pt advised at [facility] her could get the effexor for 9 dollars for a 30 days supply or 24 dollars for a 90 day supply. Pt is OK with PCP sending a 30 day supply to [facility] for ( 9 dollars) on [address] in [place] and send the Voltaran gel there as well.

## 2018-03-26 NOTE — Telephone Encounter (Signed)
Called pt's pharmacy an was on hold for a long period of time. Sent a fax as well requesting a call ASAP to discuss medication price for Leny's Rx.

## 2018-03-26 NOTE — Telephone Encounter (Signed)
Patient states that the newest prescriptions called in (he cannot remember name) are too expensive. He is requesting call back from Dr. Caryl Bis or CMA. Please advise.

## 2018-03-26 NOTE — Telephone Encounter (Signed)
Called pt's pharmacy an was on hold for a long period of time. Sent a fax as well requesting a call ASAP to discuss medication price for Julez's Rx.

## 2018-03-27 ENCOUNTER — Telehealth: Payer: Self-pay | Admitting: Family Medicine

## 2018-03-27 NOTE — Telephone Encounter (Signed)
Called and spoke with pt and advised that there must be difficulties happening with [facility]'s pharmacy phone line.  Advised pt that both the Effexor and the Voltaren was sent to [facility].   I advised pt to have his niece go to [facility] her self to see if they received the prescription and find out if they could get it ready for him that day?   Pt advised and voiced understanding.

## 2018-03-27 NOTE — Telephone Encounter (Signed)
Copied from [place] [id number]. Topic: Quick Communication - Rx Refill/Question >> Mar 27, 2018 12:17 PM Gustavus Messing wrote: Medication: diclofenac sodium (VOLTAREN) 1 % GEL [[id number]]   Has the patient contacted their pharmacy? Yes.   (Agent: If yes, when and what did the pharmacy advise?) this medication was sent to the pharmacy yesterday but they are telling the patient that they never received it. I tried to call the practice and the pharmacy a few times with no luck. Please call this medication in again anf call the patient back  Preferred Pharmacy (with phone number or street name): [facility] ([facility] [place] [phone] (Phone) [phone] (Fax)    Agent: Please be advised that RX refills may take up to 3 business days. We ask that you follow-up with your pharmacy.

## 2018-03-27 NOTE — Telephone Encounter (Signed)
Pt called again regarding diclofenac and venlafaxine. Advised pt medication orders were sent in last night after speaking to the office. Pt states they don't have it. Asked pt if he has been contacting [facility]. He stated he had. I tried calling [facility] multiple times and cannot get thru. I have tried pharmacy ph# and main #. No answer on either or ringing to a busy signal. Asked pt again and he said they haven't contacted him and his niece has to get him his RX. Pt has NOT tried to contact the pharmacy and said he can't do it. I have tried 20+ times over 30 minutes and cannot get thru.

## 2018-03-30 DIAGNOSIS — M150 Primary generalized (osteo)arthritis: Secondary | ICD-10-CM | POA: Diagnosis not present

## 2018-03-30 DIAGNOSIS — M25552 Pain in left hip: Secondary | ICD-10-CM | POA: Diagnosis not present

## 2018-03-30 DIAGNOSIS — S72002D Fracture of unspecified part of neck of left femur, subsequent encounter for closed fracture with routine healing: Secondary | ICD-10-CM | POA: Diagnosis not present

## 2018-03-30 DIAGNOSIS — N183 Chronic kidney disease, stage 3 (moderate): Secondary | ICD-10-CM | POA: Diagnosis not present

## 2018-03-30 DIAGNOSIS — H66002 Acute suppurative otitis media without spontaneous rupture of ear drum, left ear: Secondary | ICD-10-CM | POA: Diagnosis not present

## 2018-03-30 DIAGNOSIS — C4322 Malignant melanoma of left ear and external auricular canal: Secondary | ICD-10-CM | POA: Diagnosis not present

## 2018-03-30 DIAGNOSIS — R69 Illness, unspecified: Secondary | ICD-10-CM | POA: Diagnosis not present

## 2018-03-30 DIAGNOSIS — Z591 Inadequate housing: Secondary | ICD-10-CM | POA: Diagnosis not present

## 2018-03-30 DIAGNOSIS — Z760 Encounter for issue of repeat prescription: Secondary | ICD-10-CM | POA: Diagnosis not present

## 2018-03-31 ENCOUNTER — Telehealth: Payer: Self-pay | Admitting: Family Medicine

## 2018-03-31 NOTE — Telephone Encounter (Signed)
Called the number to reach out the [place] and when I called the VM was stated "Justin Andrews " Left a VM for Jenny Reichmann to call back left my name and call back number.

## 2018-03-31 NOTE — Telephone Encounter (Signed)
Copied from [place] [phone]. Topic: Quick Communication - See Telephone Encounter >> Mar 31, 2018  9:04 AM Reyne Dumas L wrote: CRM for notification. See Telephone encounter for: 03/31/18.  [name] from [facility] calling: States that there is an order for TPN for this pt but she believes that is wrong and will need verbal orders to remove this. Needs current med list faxed as they are trying to set up a pill box for pt (fax number [phone])  Needs to report: Pt is not taking any meds (only the antibiotics) because he has no money and states that he can't read the lables. Pt lives in an older home that has been converted into rental rooms (no running water in his room/no access to hand sanitizer). Pt is allowed access to a community bathroom. [facility] is trying to get a Education officer, museum out as quickly as possible. Pt has no money and would like to go back to an ALF.  Jenny Reichmann can be reached at [phone]

## 2018-03-31 NOTE — Telephone Encounter (Signed)
You can give verbal orders to remove the TPN order.  He should not have an order for TPN.  Please contact the home health agency as well and check on the status of the social worker.  I have reviewed their note and any help that the social worker can provide in finding him a assisted living facility would be appreciated.

## 2018-03-31 NOTE — Telephone Encounter (Signed)
Sent to PCP to advise 

## 2018-04-01 ENCOUNTER — Telehealth: Payer: Self-pay

## 2018-04-01 NOTE — Telephone Encounter (Signed)
Sent to PCP ?

## 2018-04-01 NOTE — Telephone Encounter (Signed)
Copied from [place] [id number]. Topic: General - Other >> Apr 01, 2018  4:48 PM Antonieta Iba C wrote: Reason for CRM: pt called in requesting a call back from PCP. Pt says that he doesn't want to go into detail but he has a few things that he would like to talk to PCP about.    CB: [phone]

## 2018-04-01 NOTE — Telephone Encounter (Signed)
Please contact the patient and see what he needs to discuss. Thanks.

## 2018-04-02 DIAGNOSIS — M150 Primary generalized (osteo)arthritis: Secondary | ICD-10-CM | POA: Diagnosis not present

## 2018-04-02 DIAGNOSIS — N183 Chronic kidney disease, stage 3 (moderate): Secondary | ICD-10-CM | POA: Diagnosis not present

## 2018-04-02 DIAGNOSIS — Z760 Encounter for issue of repeat prescription: Secondary | ICD-10-CM | POA: Diagnosis not present

## 2018-04-02 DIAGNOSIS — H66002 Acute suppurative otitis media without spontaneous rupture of ear drum, left ear: Secondary | ICD-10-CM | POA: Diagnosis not present

## 2018-04-02 DIAGNOSIS — C4322 Malignant melanoma of left ear and external auricular canal: Secondary | ICD-10-CM | POA: Diagnosis not present

## 2018-04-02 DIAGNOSIS — R69 Illness, unspecified: Secondary | ICD-10-CM | POA: Diagnosis not present

## 2018-04-02 DIAGNOSIS — M25552 Pain in left hip: Secondary | ICD-10-CM | POA: Diagnosis not present

## 2018-04-02 DIAGNOSIS — S72002D Fracture of unspecified part of neck of left femur, subsequent encounter for closed fracture with routine healing: Secondary | ICD-10-CM | POA: Diagnosis not present

## 2018-04-02 DIAGNOSIS — Z591 Inadequate housing: Secondary | ICD-10-CM | POA: Diagnosis not present

## 2018-04-02 NOTE — Telephone Encounter (Signed)
Called and spoke with pt. Pt stated that he just wanted Dr. Caryl Bis to know that he still has not been able to pick up the Effexor and plans to get it and will have to do the generic because it is cheaper.   I asked pt how he was doing. Pt stated that he hasn't been able to sleep at all and no sleep aid OTC seems to help.   Pt thank me for calling and told me to tell Dr. Caryl Bis thank you as well.

## 2018-04-02 NOTE — Telephone Encounter (Signed)
[name] with [facility] called back to speak with Wilburn Cornelia who is out of the office. Please advise. Correct CB#[phone]

## 2018-04-02 NOTE — Telephone Encounter (Signed)
I called the phone number listed to call [name] at [facility] and I believe that this phone number is incorrect every time ive called it's a VM for "Justin Andrews".   I have sent a fax to the fax number listed asking for [name] at [facility] to reach out to me to discuss mutual patient.

## 2018-04-02 NOTE — Telephone Encounter (Signed)
Noted. Thank you for touching base with him. I believe if he can get the effexor that will help with his pain and could help with his sleep.

## 2018-04-03 NOTE — Telephone Encounter (Signed)
Called [place] and left a VM to call back. CRM created and sent to [facility] pool.

## 2018-04-03 NOTE — Telephone Encounter (Signed)
Called [place] and left a VM to call back.

## 2018-04-06 ENCOUNTER — Ambulatory Visit: Payer: Self-pay | Admitting: *Deleted

## 2018-04-06 DIAGNOSIS — M150 Primary generalized (osteo)arthritis: Secondary | ICD-10-CM | POA: Diagnosis not present

## 2018-04-06 DIAGNOSIS — Z591 Inadequate housing: Secondary | ICD-10-CM | POA: Diagnosis not present

## 2018-04-06 DIAGNOSIS — C4322 Malignant melanoma of left ear and external auricular canal: Secondary | ICD-10-CM | POA: Diagnosis not present

## 2018-04-06 DIAGNOSIS — M25551 Pain in right hip: Secondary | ICD-10-CM

## 2018-04-06 DIAGNOSIS — R69 Illness, unspecified: Secondary | ICD-10-CM | POA: Diagnosis not present

## 2018-04-06 DIAGNOSIS — M25552 Pain in left hip: Secondary | ICD-10-CM | POA: Diagnosis not present

## 2018-04-06 DIAGNOSIS — S72002D Fracture of unspecified part of neck of left femur, subsequent encounter for closed fracture with routine healing: Secondary | ICD-10-CM | POA: Diagnosis not present

## 2018-04-06 DIAGNOSIS — N183 Chronic kidney disease, stage 3 (moderate): Secondary | ICD-10-CM | POA: Diagnosis not present

## 2018-04-06 DIAGNOSIS — Z760 Encounter for issue of repeat prescription: Secondary | ICD-10-CM | POA: Diagnosis not present

## 2018-04-06 DIAGNOSIS — H66002 Acute suppurative otitis media without spontaneous rupture of ear drum, left ear: Secondary | ICD-10-CM | POA: Diagnosis not present

## 2018-04-06 NOTE — Telephone Encounter (Signed)
Reviewed. Given description of the patient's issues he should be evaluated in the ED though I understand that he does not want to go.  We can have him be seen in the office though he appears to have declined that as well.  To receive treatment for this issue he will need to be seen to ensure that his hip is not broken.  Please follow-up with the home health nurse tomorrow to see what their evaluation reveals.

## 2018-04-06 NOTE — Telephone Encounter (Signed)
Please call the patient and advise him that he really needs to go to the ED given his injury and inability to bear weight or move his leg. I am concerned that he has broken his hip and he needs evaluation in the ED.

## 2018-04-06 NOTE — Telephone Encounter (Signed)
Called and spoke with pt. Pt advised and was NOT willing to go to the ED. Pt stated that his hip is NOT broken. Asked pt if he had a way to the ED and he stated "NO" as I was taking with the patient I asked him if anyone was with him and the social worker was there and I asked Mr. Blanda if I could speak with her. Mr. Harman was willing to let me talk to her.   Spoke with the social worker who seemed happy to be speaking with me while she was there with the pt. I asked the social worker if he had a way to get to the Ed and she stated YES. She stated that Tray did NOT seem willing to go to the ED just to do an X-ray. She asked him politely a few times and Mr. Harkless denied going. She stated that he is concern with what is going on with the corona virus. Social worker stated that he also has not been taking his gabapentin and she atempted to lay this medication out for him to take to see if this would relive any of his pain. Social worker stated that he does NOT seem willing to go to ED. I asked to speak to Mr. Rickett again.   Once Mr. Heid was back on the phone I advised him that we could do an X-ray at our office but we are unable to do this if he is unable to put weight on that leg plus if it is broken he will still need to go to the ED because we would be unable to do anything for him here. Pt stated that he knows that his hip is NOT broken and he is able to stand on his leg. I asked Mr. Hu if he is able to stand on his leg how long is he able to do this? Pt stated that he is able to stand on his leg as long as he wants and that he is able to stand on it today better and yesterday he wasn't able to. Pt stated that he just prefers not to stand up if he doesn't have to.   I asked the pt if he is taking his gabapentin and he stated NO he has not because he can't read the medications bottles to understand what he is taking. I asked Mr. Train if he is NOT willing to go to the ED would he be willing to be  seen at our office visit for an X-ray? Pt stated NO because he does NOT have a ride to get here. I asked Mr. Bevis how are we able to help him if he isn't willing to do what is needed to assist him? Pt stated that he is hard headed and he knows that his hip is not broken he is just in pain.   The social worker on the phone stated that a nurse will be coming by in the morning to check on the patient and to reevaluate him. Social worker stated that she left the nurse Dr. Ellen Henri name and phone number as well. Since pt is NOT willing to go to ER nor our office to be evaluated or have x-rays done will have to call and check in on patient tomorrow once nurse comes to see patient to be revaluated.

## 2018-04-06 NOTE — Telephone Encounter (Signed)
Pt reports feel Saturday night getting out of bed. Reports right hip pain, constant 10/10.  Cannot bear weight, states cannot move leg, no ROM. States has not been out of bed since fall occurred. Pt lives alone.When questioned how he was eating/drinking, stated "I crawl on my bed so I can reach my little refrigerator." Directed pt to ED, declined. Reiterated need to be seen in ED for thorough eval, continues to decline, "I won't go there with all this going on." Explained to pt this may be a serious injury. States "I know, but not going to ED". Made aware ED will take all necessary precautions for his safety. Pt requesting a ortho referral to Dr. Caryl Bis; reported to agent he saw Dr. Sabra Heck at [facility]. Continues to decline ED disposition. TN did attempt to reach [facility] to alert of refusal. Routed HP. CB# [phone] Reason for Disposition . Can't stand (bear weight) or walk  Answer Assessment - Initial Assessment Questions 1. MECHANISM: "How did the injury happen?" (e.g., twisting injury, direct blow)      Fall, tripped 2. ONSET: "When did the injury happen?" (Minutes or hours ago)      Saturday night 3. LOCATION: "Where is the injury located?"      Right hip 4. APPEARANCE of INJURY: "What does the injury look like?"  (e.g., deformity of leg)     "Can't see back there." 5. SEVERITY: "Can you put weight on that leg?" "Can you walk?"      no 6. SIZE: For cuts, bruises, or swelling, ask: "How large is it?" (e.g., inches or centimeters;  entire joint)      Unsure 7. PAIN: "Is there pain?" If so, ask: "How bad is the pain?"  (e.g., Scale 1-10; or mild, moderate, severe)     10/10, constant 8. TETANUS: For any breaks in the skin, ask: "When was the last tetanus booster?"      9. OTHER SYMPTOMS: "Do you have any other symptoms?"      no  Protocols used: HIP INJURY-A-AH

## 2018-04-07 NOTE — Telephone Encounter (Signed)
Called and spoke with pt. Pt advised and willing come into the office to be seen by [place] first and he understands that if his hip is broken he will need to go to the ED.

## 2018-04-07 NOTE — Telephone Encounter (Signed)
I am happy to see him in the office for this though if his pain is that severe with what was described previously he should be evaluated in the ED to help rule out a hip fracture.  If he does not want to go to the ED will plan on seeing him in the office tomorrow.

## 2018-04-07 NOTE — Telephone Encounter (Signed)
Pt called our office today and he stated that the nurse did not show up today for his evaluation Pt stated that his pain is a 10/10 on the pain scale and he is hurting back. Pt wanted to come to our office for a visit and to have an x-ray done for his hip pain. Pt has been scheduled for an OV at 3:30 PM.   Sent to PCP as an [place]

## 2018-04-07 NOTE — Telephone Encounter (Signed)
Called and spoke with Justin Andrews. Asked Justin Andrews if the nurse has shown up today yet for his evaluation and pt stated that NO she has NOT yet arrived. I asked Justin Andrews how he is doing he stated that he is in a lot of pain but he is able to put weight on his legs and get up and walk to the bathroom with a walker on his own.   I did advise pt that if he feels that he is unable to bear weight on his legs he really will need an OV and x-ray to be properly evaluated.  Pt declined coming into our office today due to not having a way to get here.   I advised pt that I will call him back later to see if the nurse was able to come by to see Mr. Justin Andrews today.

## 2018-04-08 ENCOUNTER — Ambulatory Visit (INDEPENDENT_AMBULATORY_CARE_PROVIDER_SITE_OTHER): Payer: Medicare HMO | Admitting: Family Medicine

## 2018-04-08 ENCOUNTER — Telehealth: Payer: Self-pay | Admitting: Family Medicine

## 2018-04-08 ENCOUNTER — Ambulatory Visit (INDEPENDENT_AMBULATORY_CARE_PROVIDER_SITE_OTHER): Payer: Medicare HMO

## 2018-04-08 ENCOUNTER — Encounter: Payer: Self-pay | Admitting: Family Medicine

## 2018-04-08 ENCOUNTER — Other Ambulatory Visit: Payer: Self-pay

## 2018-04-08 VITALS — BP 150/70 | HR 103 | Temp 98.1°F

## 2018-04-08 DIAGNOSIS — M25551 Pain in right hip: Secondary | ICD-10-CM

## 2018-04-08 DIAGNOSIS — R059 Cough, unspecified: Secondary | ICD-10-CM

## 2018-04-08 DIAGNOSIS — M898X5 Other specified disorders of bone, thigh: Secondary | ICD-10-CM

## 2018-04-08 DIAGNOSIS — R05 Cough: Secondary | ICD-10-CM | POA: Diagnosis not present

## 2018-04-08 DIAGNOSIS — M79651 Pain in right thigh: Secondary | ICD-10-CM | POA: Diagnosis not present

## 2018-04-08 DIAGNOSIS — G8929 Other chronic pain: Secondary | ICD-10-CM | POA: Insufficient documentation

## 2018-04-08 DIAGNOSIS — S72002D Fracture of unspecified part of neck of left femur, subsequent encounter for closed fracture with routine healing: Secondary | ICD-10-CM

## 2018-04-08 MED ORDER — TRAMADOL HCL 50 MG PO TABS: 50.0000 mg | ORAL_TABLET | Freq: Three times a day (TID) | ORAL | 0 refills | Status: DC | PRN

## 2018-04-08 NOTE — Assessment & Plan Note (Signed)
This is a chronic issue related to his tobacco use.  We could consider evaluation for COPD in the future.

## 2018-04-08 NOTE — Telephone Encounter (Addendum)
Discussed with x-ray staff ordering an x-ray prior to his visit so this can be done as he comes in to the office.

## 2018-04-08 NOTE — Addendum Note (Signed)
Addended by: Leone Haven on: 04/08/2018 11:37 AM   Modules accepted: Orders

## 2018-04-08 NOTE — Telephone Encounter (Signed)
Can you call the patient's home health company and find out where they stand with the social worker on potentially finding a living facility for him to go to?  Thanks.

## 2018-04-08 NOTE — Telephone Encounter (Signed)
Called and spoke with pt.   Pt denied travelling outside the country or being on a plane in the past 3 months.   Pt declined fever, or upper raspatory symptoms. Did stated that he has his same old smokers cough and SOB but he has always had this.   Pt declined being around anyone who has tested positive with the COVID-19 virus. Pt has not been outside his home in 2 weeks.   Sent to PCP as an [place]

## 2018-04-08 NOTE — Assessment & Plan Note (Signed)
Patient with right hip pain after a fall.  He notes fairly significant pain related to this.  X-ray does not reveal a fracture.  I discussed we would treat his acute pain with tramadol at a low dose and a small quantity.  We can reassess his need for additional medication moving forward though this will not be a long-term medication.  We will refer him to orthopedics as well.

## 2018-04-08 NOTE — Patient Instructions (Signed)
Nice to see you.  It does not look like you have a fracture in your left hip.  We will let you know what the x-ray of your femur reveals.  I have prescribed a small amount of tramadol to help with pain.  Please only take this as needed.  We will try to get you into see your orthopedist as well.

## 2018-04-08 NOTE — Telephone Encounter (Addendum)
Please contact the patient and make sure he is not having any respiratory complaints or fever prior to him coming in to the office.

## 2018-04-08 NOTE — Progress Notes (Signed)
[name], MD Phone: [phone]  Justin Andrews is a 84 y.o. male who presents today for same day visit.   Right hip pain: Patient notes over a week ago he tripped and fell onto his right hip.  He noted pain after this.  No head injury.  No loss of consciousness.  He noted after this he could not walk on his right leg.  He did have some difficulty moving it related to pain.  He notes he has been able to walk on it more recently though it does hurt.  He has not been taking any medications for pain.  Patient has not been taking any of his medications as he has trouble seeing the instructions that are written on the containers.  Home health has been out to evaluate him for a living facility though we do not know where that stands.  Patient reports chronic smoking and notes chronic cough related to this. He has had not change to his cough. He coughs occasionally and has been doing so for years. No fevers. No dyspnea. No travel. No coronavirus exposure.   Social History   Tobacco Use  Smoking Status Current Every Day Smoker  . Packs/day: 1.00  . Years: 70.00  . Pack years: 70.00  . Types: Cigarettes  Smokeless Tobacco Never Used     ROS see history of present illness  Objective  Physical Exam Vitals:   04/08/18 1519  BP: (!) 150/70  Pulse: (!) 103  Temp: 98.1 F (36.7 C)  SpO2: 99%    BP Readings from Last 3 Encounters:  04/08/18 (!) 150/70  03/18/18 130/66  03/14/18 (!) 170/93   Wt Readings from Last 3 Encounters:  03/18/18 130 lb 9.6 oz (59.2 kg)  03/13/18 131 lb (59.4 kg)  01/16/18 132 lb (59.9 kg)    Physical Exam Constitutional:      General: He is not in acute distress.    Appearance: He is not diaphoretic.  Cardiovascular:     Rate and Rhythm: Normal rate and regular rhythm.     Heart sounds: Normal heart sounds.  Pulmonary:     Effort: Pulmonary effort is normal.     Breath sounds: Normal breath sounds.  Musculoskeletal:     Comments: Patient is  tender over the lateral portion of his right hip, there is no bruising, there are no bony defects, he does have fairly full internal and external range of motion of his right hip with no significant discomfort, right foot warm and well perfused with 2+ DP pulse  Skin:    General: Skin is warm and dry.  Neurological:     Mental Status: He is alert.     Comments: 5/5 strength bilateral quads, hamstrings, plantar flexion, and dorsiflexion, and hip flexion, sensation light touch intact bilateral lower extremities      Assessment/Plan: Please see individual problem list.  Right hip pain Patient with right hip pain after a fall.  He notes fairly significant pain related to this.  X-ray does not reveal a fracture.  I discussed we would treat his acute pain with tramadol at a low dose and a small quantity.  We can reassess his need for additional medication moving forward though this will not be a long-term medication.  We will refer him to orthopedics as well.  Cough This is a chronic issue related to his tobacco use.  We could consider evaluation for COPD in the future.  I will have our referral coordinator touch base  with the patients home health company to see where they stand on finding him a facility to go to.   Orders Placed This Encounter  Procedures  . DG FEMUR, MIN 2 VIEWS RIGHT    Standing Status:   Future    Number of Occurrences:   1    Standing Expiration Date:   06/08/2019    Order Specific Question:   Reason for Exam (SYMPTOM  OR DIAGNOSIS REQUIRED)    Answer:   fall on to right hip, patient with right hip and femur pain    Order Specific Question:   Preferred imaging location?    Answer:   [facility] Specific Question:   Radiology Contrast Protocol - do NOT remove file path    Answer:   [email or web address]  . Ambulatory referral to Orthopedic Surgery    Referral Priority:   Routine    Referral Type:   Surgical     Referral Reason:   Specialty Services Required    Requested Specialty:   Orthopedic Surgery    Number of Visits Requested:   1    Meds ordered this encounter  Medications  . traMADol (ULTRAM) 50 MG tablet    Sig: Take 1 tablet (50 mg total) by mouth every 8 (eight) hours as needed.    Dispense:  10 tablet    Refill:  0     [name], MD [place]

## 2018-04-09 ENCOUNTER — Telehealth: Payer: Self-pay | Admitting: Family Medicine

## 2018-04-09 ENCOUNTER — Other Ambulatory Visit: Payer: Self-pay | Admitting: Family Medicine

## 2018-04-09 DIAGNOSIS — M898X5 Other specified disorders of bone, thigh: Secondary | ICD-10-CM

## 2018-04-09 NOTE — Telephone Encounter (Signed)
Erica from [employer] called the [employer] Reason for CRM: Danae Chen with [facility] called to inform the doctor that the patient refused the nursing visit today.  If there are any questions, please call her at [phone]

## 2018-04-09 NOTE — Telephone Encounter (Signed)
Copied from [place] [id number]. Topic: Quick Communication - See Telephone Encounter >> Apr 09, 2018  2:29 PM Rutherford Nail, NT wrote: CRM for notification. See Telephone encounter for: 04/09/18. Merrilee Seashore with [facility] calling and is requesting to speak to [facility]. Please advise.  CB#: [phone]

## 2018-04-09 NOTE — Telephone Encounter (Signed)
Copied from [place] [phone]. Topic: General - Other >> Apr 09, 2018 12:51 PM Keene Breath wrote: Reason for CRM: Danae Chen with [facility] called to inform the doctor that the patient refused the nursing visit today.  If there are any questions, please call her at [phone]

## 2018-04-09 NOTE — Telephone Encounter (Signed)
Please contact the patient and reinforce that the home health RN is there to evaluate and help him. Thanks.

## 2018-04-10 NOTE — Telephone Encounter (Signed)
Called Pt and he stated that he was having a bad day on the day the nurse came. Pt stated he would be okay with the nurse coming back at any time, and he understands how it is important that the nurse help him and evaluate him.

## 2018-04-10 NOTE — Telephone Encounter (Signed)
Sent to PCP as an FYI  

## 2018-04-15 DIAGNOSIS — C4322 Malignant melanoma of left ear and external auricular canal: Secondary | ICD-10-CM | POA: Diagnosis not present

## 2018-04-15 DIAGNOSIS — M150 Primary generalized (osteo)arthritis: Secondary | ICD-10-CM | POA: Diagnosis not present

## 2018-04-15 DIAGNOSIS — R69 Illness, unspecified: Secondary | ICD-10-CM | POA: Diagnosis not present

## 2018-04-15 DIAGNOSIS — H66002 Acute suppurative otitis media without spontaneous rupture of ear drum, left ear: Secondary | ICD-10-CM | POA: Diagnosis not present

## 2018-04-15 DIAGNOSIS — Z591 Inadequate housing: Secondary | ICD-10-CM | POA: Diagnosis not present

## 2018-04-15 DIAGNOSIS — M25552 Pain in left hip: Secondary | ICD-10-CM | POA: Diagnosis not present

## 2018-04-15 DIAGNOSIS — S72002D Fracture of unspecified part of neck of left femur, subsequent encounter for closed fracture with routine healing: Secondary | ICD-10-CM | POA: Diagnosis not present

## 2018-04-15 DIAGNOSIS — N183 Chronic kidney disease, stage 3 (moderate): Secondary | ICD-10-CM | POA: Diagnosis not present

## 2018-04-15 DIAGNOSIS — Z760 Encounter for issue of repeat prescription: Secondary | ICD-10-CM | POA: Diagnosis not present

## 2018-04-16 ENCOUNTER — Encounter: Admission: RE | Payer: Self-pay | Source: Home / Self Care

## 2018-04-16 ENCOUNTER — Telehealth: Payer: Self-pay | Admitting: Family Medicine

## 2018-04-16 ENCOUNTER — Telehealth: Payer: Self-pay

## 2018-04-16 ENCOUNTER — Ambulatory Visit: Admission: RE | Admit: 2018-04-16 | Payer: Medicare HMO | Source: Home / Self Care

## 2018-04-16 SURGERY — PHACOEMULSIFICATION, CATARACT, WITH IOL INSERTION
Anesthesia: Topical | Laterality: Right

## 2018-04-16 NOTE — Telephone Encounter (Signed)
Sent to PCP please advise if OK to schedule for an OV in our office.

## 2018-04-16 NOTE — Telephone Encounter (Signed)
Scheduled patient for in office visit, at this time patient denies travel to any [place] area within or out of state, Denies fever, chills, SOB, nausea or diarrhea at this time . Stated he has not come in contact with any known COVID patient.

## 2018-04-16 NOTE — Telephone Encounter (Signed)
Copied from [place] [phone]. Topic: Appointment Scheduling - Scheduling Inquiry for Clinic >> Apr 15, 2018  5:42 PM Nils Flack wrote: Reason for CRM: [phone] Pt called - he would to set up appt for his wrist and hip. Please call  Thanks

## 2018-04-16 NOTE — Telephone Encounter (Signed)
He needs to be evaluated at urgent care today for his wrist. Please advise him of this. If he refuses please check with Debbra Riding and Lorriane Shire to see if we are currently able to see patients in the office. If we are he will need to have COVID19 screening questions completed prior to scheduling and on Monday when he is scheduled. Please also find out if he has been contacted by orthopedics to schedule his appointment with them yet. He needs to see them for his hip pain.

## 2018-04-16 NOTE — Telephone Encounter (Signed)
Called and spoke with pt. Pt stated that he is NOT going to an Urgent care. Sent to [place]. Please advise if pt can be seen in OV?   Thanks

## 2018-04-16 NOTE — Telephone Encounter (Signed)
Called and spoke to pt.  Pt c/o having ongoing hip pain 10/10 associated w/ his fall from last week.  Pt said that he still needs to use a walker to get around.  Pt also c/o left wrist being swollen when he woke up this morning but denies having pain w/ swelling.  Pt said that he has not taken any medicine for the pain.    Pt wanted to schedule an appt w/ Dr. Caryl Bis.  Informed pt that Dr. Caryl Bis is not in the office today.  Pt refused to schedule an appt w/ another provider in office.  Asked pt about going to Urgent Care, pt refused and said that he wants to wait for Dr. Caryl Bis to return and schedule an appt w/ him on Monday.    Asked pt if home health nurse has been out to home.  Pt said that hh nurse came out yesterday but he does not expect for her to return today since she doesn't come to the home everyday.  Informed pt that notes will be forwarded to Dr. Ellen Henri CMA for a return call to pt and advisement about appt.

## 2018-04-16 NOTE — Telephone Encounter (Signed)
See other phone note

## 2018-04-16 NOTE — Telephone Encounter (Signed)
What would you like to do Dr.Sonnenberg

## 2018-04-16 NOTE — Telephone Encounter (Signed)
I called pt regarding setting him up for virtual appt. Pt states he does not have smart phone or computer. Pt did state that his hip is hurting him really bad. Pt fell, his xray showed no break per pt. I offered him a sooner appt he wanted to wait to talk to Dr Caryl Bis.

## 2018-04-17 ENCOUNTER — Other Ambulatory Visit: Payer: Self-pay | Admitting: Family Medicine

## 2018-04-17 NOTE — Telephone Encounter (Signed)
Requested medication (s) are due for refill today: yes  Requested medication (s) are on the active medication list: yes  Last refill:  04/08/18  Future visit scheduled: yes  Notes to clinic:  Medication not delegated to NT to refill   Requested Prescriptions  Pending Prescriptions Disp Refills   traMADol (ULTRAM) 50 MG tablet [Pharmacy Med Name: TRAMADOL HCL 50 MG TABLET] 10 tablet 0    Sig: Take 1 tablet (50 mg total) by mouth every 8 (eight) hours as needed.     Not Delegated - Analgesics:  Opioid Agonists Failed - 04/17/2018  3:21 PM      Failed - This refill cannot be delegated      Passed - Urine Drug Screen completed in last 360 days.      Passed - Valid encounter within last 6 months    Recent Outpatient Visits          1 week ago Pain in right femur   [place] Sonnenberg, Angela Adam, MD   1 month ago CKD (chronic kidney disease) stage 3, GFR 30-59 ml/min ([place])   [place] Leone Haven, MD   3 months ago Anemia, unspecified type   [facility] Leone Haven, MD   5 months ago DOE (dyspnea on exertion)   [facility] Leone Haven, MD   5 months ago Frequency of urination   [place] Sonnenberg, Angela Adam, MD      Future Appointments            In 3 days Caryl Bis, Angela Adam, MD [place], [place]   In 5 days Caryl Bis, Angela Adam, MD [facility], [place]   In 1 week Caryl Bis, Angela Adam, MD [facility], [place]   In 3 months O'Brien-Blaney, Bryson Corona, LPN [place], [place]   In 3 months Caryl Bis, Angela Adam, MD [facility], [facility]

## 2018-04-20 ENCOUNTER — Ambulatory Visit: Payer: Self-pay | Admitting: Family Medicine

## 2018-04-20 ENCOUNTER — Telehealth: Payer: Self-pay

## 2018-04-20 MED ORDER — TRAMADOL HCL 50 MG PO TABS: 50.0000 mg | ORAL_TABLET | Freq: Three times a day (TID) | ORAL | 0 refills | Status: DC | PRN

## 2018-04-20 NOTE — Telephone Encounter (Signed)
Last OV 04/08/2018   Last refilled 04/08/2018 disp 10 with no refills   Sent to PCP pt is coming in today for an appt

## 2018-04-20 NOTE — Telephone Encounter (Signed)
Copied from [place] [phone]. Topic: Appointment Scheduling - Scheduling Inquiry for Clinic >> Apr 17, 2018 11:46 AM Yvette Rack wrote: Reason for CRM: Pt called in to verify appt. Pt stated he received an automated call reminding him of the appt. Pt would like a call back for clarity on the appt as there are 3 appts scheduled for 04/20/18, 04/22/18, and 04/24/18. Cb# [phone]

## 2018-04-20 NOTE — Telephone Encounter (Signed)
It was declined d/t pt not returning their call-I will call him to give him the number to schedule.  "Rejection Reason - Patient did not respond - Good Afternoon, We have contacted this patient 2 times, left 2 messages and mailed a letter. This patient has not contacted [place] back to schedule. Referral is being closed due to time sensitivity but pt has our info to call and schedule after today and we will still be happy to assist your office and the patient. Thank you! "

## 2018-04-20 NOTE — Telephone Encounter (Signed)
Noted. I will discuss this with him on Wednesday.

## 2018-04-20 NOTE — Telephone Encounter (Signed)
I will send in a small amount of tramadol for him to take. He needs to keep his appointment on Wednesday. Please also check on the status of his orthopedic referral. He will need to see them moving forward for management of his pain. You can cancel his Friday appointment. Thanks.

## 2018-04-20 NOTE — Telephone Encounter (Signed)
Sent to PCP ?

## 2018-04-20 NOTE — Telephone Encounter (Signed)
Called and spoke with pt. Pt advised and voiced understanding will come on Wednsday and I have canceled the appt for Friday. Pt has not heard anything in regards to getting set up for an appt with Orthopedic doctor.   Sent to [facility] to check on status of referral.   Thanks

## 2018-04-20 NOTE — Addendum Note (Signed)
Addended by: Caryl Bis, Yazmen Briones G on: 04/20/2018 12:30 PM   Modules accepted: Orders

## 2018-04-20 NOTE — Telephone Encounter (Signed)
Copied from [place] [phone]. Topic: Quick Communication - Appointment Cancellation >> Apr 20, 2018  7:27 AM Justin Andrews wrote: Patient called to cancel appointment scheduled for today with dr Caryl Bis. Pt has an appointment on wed. Pt hip is bothering him and he is having trouble walking. Pt would like to know if md would call in some tramadol to [address] in [place]

## 2018-04-21 NOTE — Telephone Encounter (Signed)
Sent to PCP ?

## 2018-04-21 NOTE — Telephone Encounter (Signed)
Copied from [place] [id number]. Topic: General - Inquiry >> Apr 21, 2018 10:20 AM Richardo Priest, NT wrote: Reason for CRM: Ms. Olena Heckle, from [facility], called in stating she has an open case on this patient and would like a call back from Dr.Sonnenberg in regards to any concerns he may have for the patient. Call back number is [phone] agency cell number.

## 2018-04-22 ENCOUNTER — Ambulatory Visit (INDEPENDENT_AMBULATORY_CARE_PROVIDER_SITE_OTHER): Payer: Medicare HMO | Admitting: Family Medicine

## 2018-04-22 ENCOUNTER — Other Ambulatory Visit: Payer: Self-pay

## 2018-04-22 ENCOUNTER — Encounter: Payer: Self-pay | Admitting: Family Medicine

## 2018-04-22 VITALS — BP 140/68 | HR 100 | Temp 98.1°F | Ht 68.0 in | Wt 131.6 lb

## 2018-04-22 DIAGNOSIS — M25532 Pain in left wrist: Secondary | ICD-10-CM | POA: Diagnosis not present

## 2018-04-22 DIAGNOSIS — G2581 Restless legs syndrome: Secondary | ICD-10-CM

## 2018-04-22 DIAGNOSIS — M25539 Pain in unspecified wrist: Secondary | ICD-10-CM | POA: Insufficient documentation

## 2018-04-22 DIAGNOSIS — S72002D Fracture of unspecified part of neck of left femur, subsequent encounter for closed fracture with routine healing: Secondary | ICD-10-CM

## 2018-04-22 LAB — FERRITIN: Ferritin: 40.1 ng/mL (ref 22.0–322.0)

## 2018-04-22 MED ORDER — TRAMADOL HCL 50 MG PO TABS: 50.0000 mg | ORAL_TABLET | Freq: Three times a day (TID) | ORAL | 0 refills | Status: DC | PRN

## 2018-04-22 NOTE — Telephone Encounter (Signed)
I attempted to call Ms. Justin Andrews.  There is no answer.  I left a voicemail asking her to call back to the office.  We will await her call back.

## 2018-04-22 NOTE — Patient Instructions (Signed)
Nice to see you. We will get lab work today and contact you with the results. Please limit your use of tramadol. You will need to see orthopedics moving forward to help with your pain.

## 2018-04-22 NOTE — Progress Notes (Signed)
[name], MD Phone: [phone]  AMIRE LEAZER is a 84 y.o. male who presents today for follow-up.  Bilateral hip pain: Patient notes he continues to have issues.  Right greater than left.  No additional falls or injuries.  He does note the tramadol does help with this and he requested an increased amount of tramadol.  He has not seen orthopedics yet.  They have called him twice and sent a letter though he has not called to set this up.  Left wrist pain: Patient notes this swelled up and was painful several days ago.  Notes it has improved and resolved on its own.  No injury.  Restless leg syndrome: Patient notes for 3 to 4 years he is felt the need to move his right leg at night.  It keeps him awake.  He has not taken any medications for it.  He notes no daytime symptoms.  Social History   Tobacco Use  Smoking Status Current Every Day Smoker  . Packs/day: 1.00  . Years: 70.00  . Pack years: 70.00  . Types: Cigarettes  Smokeless Tobacco Never Used     ROS see history of present illness  Objective  Physical Exam Vitals:   04/22/18 1322  BP: 140/68  Pulse: 100  Temp: 98.1 F (36.7 C)  SpO2: 99%    BP Readings from Last 3 Encounters:  04/22/18 140/68  04/08/18 (!) 150/70  03/18/18 130/66   Wt Readings from Last 3 Encounters:  04/22/18 131 lb 9.6 oz (59.7 kg)  03/18/18 130 lb 9.6 oz (59.2 kg)  03/13/18 131 lb (59.4 kg)    Physical Exam Constitutional:      General: He is not in acute distress.    Appearance: He is not diaphoretic.  Cardiovascular:     Rate and Rhythm: Normal rate and regular rhythm.     Heart sounds: Normal heart sounds.  Pulmonary:     Effort: Pulmonary effort is normal.     Breath sounds: Normal breath sounds.  Musculoskeletal:     Comments: Left wrist with no tenderness, no swelling, no warmth, no erythema, bilateral hips with intact range of motion with external and internal rotation though he does have discomfort on external and  internal rotation of the right hip  Skin:    General: Skin is warm and dry.  Neurological:     Mental Status: He is alert.     Comments: 5/5 strength in bilateral biceps, triceps, grip, quads, hamstrings, plantar and dorsiflexion, sensation to light touch intact in bilateral UE and LE, normal gait, 2+ patellar reflexes      Assessment/Plan: Please see individual problem list.  RLS (restless legs syndrome) We will check a ferritin level.  Consider treatment once this returns.  Closed left hip fracture ([place]) Patient continues to have pain in his bilateral hips right greater than left.  We have attempted to get him scheduled with his orthopedist though the patient has a bill.  He was provided with the number to call to settle this bill and then they will schedule him for an appointment.  We are additionally referring him to an alternative orthopedist in case the patient is unable to be scheduled with his prior orthopedic physician.  He was provided with a small refill of tramadol for his pain.  He was advised to use this sparingly.  I discussed my reasoning regarding providing him with a small amount.  I discussed that he would have to be evaluated by orthopedics to  help determine a cause of his pain and to help manage his pain.  Wrist pain Resolved.  Discussed monitoring for return of symptoms.    Orders Placed This Encounter  Procedures  . Ferritin    Meds ordered this encounter  Medications  . traMADol (ULTRAM) 50 MG tablet    Sig: Take 1 tablet (50 mg total) by mouth every 8 (eight) hours as needed.    Dispense:  10 tablet    Refill:  0     [name], MD [place]

## 2018-04-22 NOTE — Telephone Encounter (Signed)
I have tried to call on 2 occassions regarding this. Could you please try to contact them and let me know when you can get them on the phone? I do have concerns that I need to relay.

## 2018-04-22 NOTE — Assessment & Plan Note (Addendum)
Patient continues to have pain in his bilateral hips right greater than left.  We have attempted to get him scheduled with his orthopedist though the patient has a bill.  He was provided with the number to call to settle this bill and then they will schedule him for an appointment.  We are additionally referring him to an alternative orthopedist in case the patient is unable to be scheduled with his prior orthopedic physician.  He was provided with a small refill of tramadol for his pain.  He was advised to use this sparingly.  I discussed my reasoning regarding providing him with a small amount.  I discussed that he would have to be evaluated by orthopedics to help determine a cause of his pain and to help manage his pain.

## 2018-04-22 NOTE — Assessment & Plan Note (Signed)
We will check a ferritin level.  Consider treatment once this returns.

## 2018-04-22 NOTE — Assessment & Plan Note (Signed)
Resolved.  Discussed monitoring for return of symptoms.

## 2018-04-22 NOTE — Telephone Encounter (Signed)
Number listed to call Ms. Olena Heckle is documented incorrectly. The correct phone number to Ms. Olena Heckle is the following [phone]. Did call and call went to her VM left a detailed VM and advised to call our main office or me directly to discuss pt. CRM created and sent to [facility] pool.    Sent to PCP correct number is [phone]

## 2018-04-23 ENCOUNTER — Other Ambulatory Visit: Payer: Self-pay | Admitting: Family Medicine

## 2018-04-23 ENCOUNTER — Telehealth: Payer: Self-pay

## 2018-04-23 DIAGNOSIS — Z760 Encounter for issue of repeat prescription: Secondary | ICD-10-CM | POA: Diagnosis not present

## 2018-04-23 DIAGNOSIS — M25552 Pain in left hip: Secondary | ICD-10-CM | POA: Diagnosis not present

## 2018-04-23 DIAGNOSIS — N183 Chronic kidney disease, stage 3 (moderate): Secondary | ICD-10-CM | POA: Diagnosis not present

## 2018-04-23 DIAGNOSIS — M150 Primary generalized (osteo)arthritis: Secondary | ICD-10-CM | POA: Diagnosis not present

## 2018-04-23 DIAGNOSIS — S72002D Fracture of unspecified part of neck of left femur, subsequent encounter for closed fracture with routine healing: Secondary | ICD-10-CM | POA: Diagnosis not present

## 2018-04-23 DIAGNOSIS — H66002 Acute suppurative otitis media without spontaneous rupture of ear drum, left ear: Secondary | ICD-10-CM | POA: Diagnosis not present

## 2018-04-23 DIAGNOSIS — C4322 Malignant melanoma of left ear and external auricular canal: Secondary | ICD-10-CM | POA: Diagnosis not present

## 2018-04-23 DIAGNOSIS — R69 Illness, unspecified: Secondary | ICD-10-CM | POA: Diagnosis not present

## 2018-04-23 DIAGNOSIS — Z591 Inadequate housing: Secondary | ICD-10-CM | POA: Diagnosis not present

## 2018-04-23 MED ORDER — FERROUS SULFATE 325 (65 FE) MG PO TABS: 325.0000 mg | ORAL_TABLET | Freq: Every day | ORAL | 1 refills | Status: DC

## 2018-04-23 NOTE — Telephone Encounter (Signed)
Attempted to contact Lynne Leader. It stated that the number was not available at this time.

## 2018-04-23 NOTE — Telephone Encounter (Signed)
Copied from [place] [phone]. Topic: General - Inquiry >> Apr 23, 2018  9:17 AM Scherrie Gerlach wrote: Reason for CRM: Lynne Leader from [facility] adult protective services is returning Dr Josephina Gip call. She asked please call her at   [phone]  She is working from home.  I see Dr Josephina Gip not in the office today

## 2018-04-24 ENCOUNTER — Telehealth: Payer: Self-pay | Admitting: Family Medicine

## 2018-04-24 ENCOUNTER — Ambulatory Visit: Payer: Self-pay | Admitting: Family Medicine

## 2018-04-24 DIAGNOSIS — G8929 Other chronic pain: Secondary | ICD-10-CM

## 2018-04-24 DIAGNOSIS — M25552 Pain in left hip: Principal | ICD-10-CM

## 2018-04-24 DIAGNOSIS — M25551 Pain in right hip: Principal | ICD-10-CM

## 2018-04-24 MED ORDER — TRAMADOL HCL 50 MG PO TABS: 50.0000 mg | ORAL_TABLET | Freq: Three times a day (TID) | ORAL | 0 refills | Status: DC | PRN

## 2018-04-24 NOTE — Telephone Encounter (Signed)
Has he called to settle his balance with orthopedics? Has he scheduled an appointment with orthopedics for evaluation? If he has not done these things I will provide one refill and then will not be able to prescribe any additional tramadol. We will have to consider an alternative medication for his pain such as effexor that was prescribed previously.

## 2018-04-24 NOTE — Telephone Encounter (Signed)
Sent to PCP to advise 

## 2018-04-24 NOTE — Telephone Encounter (Signed)
Did you call to speak with Justin Andrews today?

## 2018-04-24 NOTE — Telephone Encounter (Signed)
Called and spoke with patient. Pt advised and voiced understanding.  

## 2018-04-24 NOTE — Telephone Encounter (Signed)
I spoke with her. Please see the other phone note.

## 2018-04-24 NOTE — Telephone Encounter (Signed)
Noted.  I will send in a short-term refill.  He will need to have a plan for his pain from orthopedics moving forward.  Please additionally follow-up with him on Monday to see if he did see orthopedics.

## 2018-04-24 NOTE — Telephone Encounter (Signed)
Pt stated that yes he has called the orthopedic and he has an appt with them on Monday. Due you still want to refill the tramadol?

## 2018-04-24 NOTE — Telephone Encounter (Signed)
Copied from [place] [phone]. Topic: Quick Communication - Rx Refill/Question >> Apr 24, 2018 10:28 AM Margot Ables wrote: Medication: traMADol (ULTRAM) 50 MG tablet - pt states he hasn't slept in 4 days and 4 nights due to hip pain - pt asking for refill on tramadol. He said that he will be out tonight from the RX sent 4/15 for 10 doses.   Has the patient contacted their pharmacy? no  Preferred Pharmacy (with phone number or street name): [id number] [address], [place] [phone] (Phone) [phone] (Fax)

## 2018-04-24 NOTE — Telephone Encounter (Signed)
I spoke with [facility] today.  She asked if I had any concerns and I noted that I did have a number of concerns regarding the patient.  My first concern is particularly regarding his lack of follow-up with his specialists and the fact that he has not followed through on any of the referrals or appointments that we have scheduled with dermatology as well as with GI.  I also discussed that he is not been able to see psychiatry either.  I noted that the patient has melanoma that he is not been able to get taking care of.  She wondered if there is a particular reason that was keeping him from doing these things I did discuss that he does have some transportation issues though he does seem to be able to find a ride to our office for his appointments.  She asked if I had any thoughts on that and I noted that it may be related to [place] previously providing his pain medication.  I did discuss that he has had some chronic pain issues particularly in his left hip though has had some more acute pain issues in his right hip recently after a fall.  I advised that he was evaluated in our office for that and we have been providing him with a small amount of tramadol for his acute pain.  I advised that he does have an orthopedic appointment per the patient's report on Monday.  I also discussed that he does seem to have telephone issues with his voicemail being full and at times he will not answer phone calls and at other times he will answer phone calls.  She noted that he has not been very receptive to her help.  She noted that he has been declining home health PT and OT.  She asked if he had a mental health diagnosis and I did discuss that he has a history of depression and has had suicidal ideation in the past.  Discussed that he was referred to psychiatry though did not end up seeing them.

## 2018-04-27 ENCOUNTER — Telehealth: Payer: Self-pay | Admitting: Family Medicine

## 2018-04-27 ENCOUNTER — Other Ambulatory Visit: Payer: Self-pay | Admitting: Family Medicine

## 2018-04-27 ENCOUNTER — Other Ambulatory Visit: Payer: Self-pay

## 2018-04-27 ENCOUNTER — Encounter: Payer: Self-pay | Admitting: Family Medicine

## 2018-04-27 ENCOUNTER — Ambulatory Visit (INDEPENDENT_AMBULATORY_CARE_PROVIDER_SITE_OTHER): Payer: Medicare HMO | Admitting: Family Medicine

## 2018-04-27 DIAGNOSIS — F419 Anxiety disorder, unspecified: Secondary | ICD-10-CM

## 2018-04-27 DIAGNOSIS — S72002D Fracture of unspecified part of neck of left femur, subsequent encounter for closed fracture with routine healing: Secondary | ICD-10-CM | POA: Diagnosis not present

## 2018-04-27 DIAGNOSIS — F32A Depression, unspecified: Secondary | ICD-10-CM

## 2018-04-27 DIAGNOSIS — F329 Major depressive disorder, single episode, unspecified: Secondary | ICD-10-CM | POA: Diagnosis not present

## 2018-04-27 DIAGNOSIS — R197 Diarrhea, unspecified: Secondary | ICD-10-CM | POA: Diagnosis not present

## 2018-04-27 DIAGNOSIS — R69 Illness, unspecified: Secondary | ICD-10-CM | POA: Diagnosis not present

## 2018-04-27 MED ORDER — TRAMADOL HCL 50 MG PO TABS: 50.0000 mg | ORAL_TABLET | Freq: Three times a day (TID) | ORAL | 0 refills | Status: DC | PRN

## 2018-04-27 NOTE — Assessment & Plan Note (Signed)
Patient with issues with diarrhea and abdominal pain.  Given his report of significant abdominal pain I did advise evaluation in the emergency department though he declined this given that he has been improving.  He will monitor for recurrence and if this recurs he will be evaluated immediately.  If his diarrhea is not improving he will let us know.

## 2018-04-27 NOTE — Assessment & Plan Note (Signed)
Patient continues to have issues with pain related to this and his osteoarthritis.  I discussed that I would provide him with a very short-term refill of the tramadol and that he would have to see orthopedics for further evaluation and management of his pain.  I advised that if he does not see orthopedics to try to figure out a cause of his pain that I would no longer prescribe any short-term pain medication for his current pain.  I will have somebody from our office touch base with orthopedics to see if the patient scheduled an appointment.

## 2018-04-27 NOTE — Telephone Encounter (Signed)
Pt called and stated Dr Biagio Quint was going to call in ten more pills of his Tramadol. I advised pt that it was called in on 4/17. He said that he is out. Advised patient the office closed at 4 - patient disconnected the phone. Please Advise.

## 2018-04-27 NOTE — Assessment & Plan Note (Signed)
Patient continues to have issues with this.  It remains unclear to me whether or not he is truly taking his venlafaxine.  I discussed the need to have home health into his home to evaluate as they would be able to confirm whether or not he has been taking his medications.

## 2018-04-27 NOTE — Telephone Encounter (Signed)
Please contact his home health company and let them know that the patient advised me that he is willing to let them in to the home to provide services. Please also find out where they stand regarding possible ALF or SNF placement if they have been able to work on that for the patient. Please also contact the orthopedic office and confirm whether or not the patient had a scheduled appointment on 04/27/18. Thanks.

## 2018-04-27 NOTE — Addendum Note (Signed)
Addended by: Leone Haven on: 04/27/2018 07:11 PM   Modules accepted: Orders

## 2018-04-27 NOTE — Progress Notes (Signed)
Virtual Visit via telephone note  This visit type was conducted due to national recommendations for restrictions regarding the COVID-19 pandemic (e.g. social distancing).  This format is felt to be most appropriate for this patient at this time.  All issues noted in this document were discussed and addressed.  No physical exam was performed (except for noted visual exam findings with Video Visits).   I connected with Justin Andrews on 04/27/18 at  3:15 PM EDT by telephone and verified that I am speaking with the correct person using two identifiers. Location patient: home Location provider: work Persons participating in the virtual visit: patient, provider  I discussed the limitations, risks, security and privacy concerns of performing an evaluation and management service by telephone and the availability of in person appointments. I also discussed with the patient that there may be a patient responsible charge related to this service. The patient expressed understanding and agreed to proceed.  Interactive audio and video telecommunications were attempted between this provider and patient, however failed, due to patient having technical difficulties OR patient did not have access to video capability.  We continued and completed visit with audio only.  Reason for visit: Same day visit for chronic pain, diarrhea, abdominal pain, depression  HPI: Chronic pain: Patient continues to have issues with pain particularly in his hips.  He has been taking tramadol every 8 hours and notes it does help some though he feels like he needs a higher dose.  He was supposed to see orthopedics today though could not get a ride.    Abdominal pain/diarrhea: The patient notes that he has difficulty caring for himself.  It is hard for him to get around due to his pain.  He notes he has not had anything to eat in 10 days.  He does not have much of an appetite.  He cannot get to the bathroom quickly enough and has been having  bowel movements on himself.  He notes he will have a bowel movement without realizing it.  He notes no back pain.  No urine incontinence.  No saddle anesthesia.  He did note some abdominal pain that was fairly significant over the last several days that has improved to some degree.  He was having bowel movements every 15 minutes.  He has had no fevers.  He notes his stomach pain was in his lower abdomen.  No blood in his stool.  Depression: Patient notes he does feel depressed.  Initially he noted he was not taking any medication other than his pain medicine though he then reported taking his venlafaxine.  He does not have much support per his report.  He notes no SI.  The patient has had home health that has been coming to his house.  He reports he has not been accepting of their care as he does not want them to see where he lives.  He thinks that he would benefit from living in an assisted living facility or skilled nursing facility.  I had a long discussion with him regarding the need for him to accept care from physical therapy, Occupational Therapy, and the social worker so that they could potentially help him be placed in a facility that could help provide care for him.   ROS: See pertinent positives and negatives per HPI.  Past Medical History:  Diagnosis Date  . Elevated serum creatinine   . History of melanoma   . Hypertension   . Osteoarthritis   . Tobacco abuse  Past Surgical History:  Procedure Laterality Date  . ANKLE FRACTURE SURGERY    . EXTERNAL EAR SURGERY    . INTRAMEDULLARY (IM) NAIL INTERTROCHANTERIC Left 12/01/2017   Procedure: INTRAMEDULLARY (IM) NAIL INTERTROCHANTRIC;  Surgeon: Thornton Park, MD;  Location: [facility] ORS;  Service: Orthopedics;  Laterality: Left;  . ROTATOR CUFF REPAIR    . TONSILLECTOMY    . TYMPANOPLASTY W/ MASTOIDECTOMY     Patient with reports of mastoid surgery (appears to have had surgery on TM; Left).    Family History  Problem Relation  Age of Onset  . Prostate cancer Neg Hx   . Bladder Cancer Neg Hx   . Kidney cancer Neg Hx     SOCIAL HX: Smoker   Current Outpatient Medications:  .  amLODipine (NORVASC) 10 MG tablet, Take 1 tablet (10 mg total) by mouth daily., Disp: 90 tablet, Rfl: 3 .  calcium-vitamin D (OSCAL WITH D) 500-200 MG-UNIT tablet, Take 1 tablet by mouth 2 (two) times daily., Disp: 60 tablet, Rfl: 1 .  carvedilol (COREG) 6.25 MG tablet, Take 2 tablets (12.5 mg total) by mouth 2 (two) times daily with a meal., Disp: 60 tablet, Rfl: 5 .  [employer]S VITAMIN B12 1000 MCG tablet, TAKE 1 TABLET BY MOUTH EVERY DAY, Disp: 90 tablet, Rfl: 1 .  diclofenac sodium (VOLTAREN) 1 % GEL, Apply 2 g topically 4 (four) times daily as needed (left hip pain)., Disp: 100 g, Rfl: 0 .  feeding supplement, ENSURE ENLIVE, (ENSURE ENLIVE) LIQD, Take 237 mLs by mouth 2 (two) times daily between meals., Disp: 237 mL, Rfl: 12 .  ferrous sulfate 325 (65 FE) MG tablet, Take 1 tablet (325 mg total) by mouth daily with breakfast., Disp: 90 tablet, Rfl: 1 .  folic acid (FOLVITE) 1 MG tablet, TAKE 1 TABLET BY MOUTH EVERY DAY, Disp: 90 tablet, Rfl: 1 .  gabapentin (NEURONTIN) 100 MG capsule, Take 2 capsules (200 mg total) by mouth 3 (three) times daily., Disp: 20 capsule, Rfl: 0 .  Multiple Vitamin (MULTIVITAMIN WITH MINERALS) TABS tablet, Take 1 tablet by mouth daily., Disp: 30 tablet, Rfl: 0 .  ranitidine (ZANTAC) 150 MG tablet, Take 150 mg by mouth 2 (two) times daily., Disp: , Rfl:  .  tamsulosin (FLOMAX) 0.4 MG CAPS capsule, Take 1 capsule (0.4 mg total) by mouth daily., Disp: 30 capsule, Rfl: 3 .  thiamine 100 MG tablet, Take 1 tablet (100 mg total) by mouth daily., Disp: 30 tablet, Rfl: 0 .  traMADol (ULTRAM) 50 MG tablet, Take 1 tablet (50 mg total) by mouth every 8 (eight) hours as needed., Disp: 10 tablet, Rfl: 0 .  venlafaxine XR (EFFEXOR-XR) 37.5 MG 24 hr capsule, Take 1 tablet by mouth once daily for 7 days starting on 04/01/18, then take 2  tablets by mouth once daily, Disp: 60 capsule, Rfl: 1 .  vitamin C (VITAMIN C) 250 MG tablet, Take 1 tablet (250 mg total) by mouth 2 (two) times daily., Disp: 30 tablet, Rfl: 0  EXAM: This was a telehealth telephone visit and a physical exam was not completed.   ASSESSMENT AND PLAN:  Discussed the following assessment and plan:  Diarrhea, unspecified type  Anxiety and depression  Closed fracture of left hip with routine healing, subsequent encounter  Diarrhea Patient with issues with diarrhea and abdominal pain.  Given his report of significant abdominal pain I did advise evaluation in the emergency department though he declined this given that he has been improving.  He will monitor  for recurrence and if this recurs he will be evaluated immediately.  If his diarrhea is not improving he will let us know.  Anxiety and depression Patient continues to have issues with this.  It remains unclear to me whether or not he is truly taking his venlafaxine.  I discussed the need to have home health into his home to evaluate as they would be able to confirm whether or not he has been taking his medications.  Closed left hip fracture [facility]) Patient continues to have issues with pain related to this and his osteoarthritis.  I discussed that I would provide him with a very short-term refill of the tramadol and that he would have to see orthopedics for further evaluation and management of his pain.  I advised that if he does not see orthopedics to try to figure out a cause of his pain that I would no longer prescribe any short-term pain medication for his current pain.  I will have somebody from our office touch base with orthopedics to see if the patient scheduled an appointment.  I had a long discussion with the patient regarding his need to allow home health into his home.  I discussed that we are trying to help him though if he is not going to try to help himself there is nothing we can do to provide  further help for him with regards to an alternative living situation.  I advised him that he has to participate in this process for him to get any help.  I will have our referral coordinator touch base with his home health company and let them know that the patient agreed to let them into his home for evaluation. I also discussed with the patient that I could not admit him to the hospital for evaluation and that he would have to present to the ED for evaluation.    I discussed the assessment and treatment plan with the patient. The patient was provided an opportunity to ask questions and all were answered. The patient agreed with the plan and demonstrated an understanding of the instructions.   The patient was advised to call back or seek an in-person evaluation if the symptoms worsen or if the condition fails to improve as anticipated.  I provided 25 minutes of non-face-to-face time during this encounter.   [name], MD

## 2018-04-28 NOTE — Telephone Encounter (Signed)
I sent a refill in yesterday.  I also received a message that he had not contacted [facility] to settle his bill and get an appointment set up.  I also received a message that he declined an appointment with [facility] due to not having a ride.  He was provided with the phone number to contact [facility].  He needs to contact them to come up with a payment plan and get an appointment set up.  He has to see orthopedics to help evaluate him and help manage his pain.  I will not continue to prescribe tramadol moving forward given his prior excessive use of tramadol and his lack of appropriate follow-up for his pain issues with his specialists.  I am happy to refer him to a pain specialist to help manage his pain.

## 2018-04-28 NOTE — Telephone Encounter (Signed)
Referral placed.

## 2018-04-28 NOTE — Telephone Encounter (Signed)
Called and spoke with pt. Pt advised and stated that YES he would like a referral place to see a pain specialist doctor.

## 2018-04-28 NOTE — Telephone Encounter (Signed)
Sent to PCP ?

## 2018-04-29 ENCOUNTER — Telehealth: Payer: Self-pay

## 2018-04-29 DIAGNOSIS — S72002D Fracture of unspecified part of neck of left femur, subsequent encounter for closed fracture with routine healing: Secondary | ICD-10-CM | POA: Diagnosis not present

## 2018-04-29 DIAGNOSIS — M25552 Pain in left hip: Secondary | ICD-10-CM | POA: Diagnosis not present

## 2018-04-29 DIAGNOSIS — M150 Primary generalized (osteo)arthritis: Secondary | ICD-10-CM | POA: Diagnosis not present

## 2018-04-29 DIAGNOSIS — R69 Illness, unspecified: Secondary | ICD-10-CM | POA: Diagnosis not present

## 2018-04-29 DIAGNOSIS — Z760 Encounter for issue of repeat prescription: Secondary | ICD-10-CM | POA: Diagnosis not present

## 2018-04-29 DIAGNOSIS — Z591 Inadequate housing: Secondary | ICD-10-CM | POA: Diagnosis not present

## 2018-04-29 DIAGNOSIS — N183 Chronic kidney disease, stage 3 (moderate): Secondary | ICD-10-CM | POA: Diagnosis not present

## 2018-04-29 DIAGNOSIS — C4322 Malignant melanoma of left ear and external auricular canal: Secondary | ICD-10-CM | POA: Diagnosis not present

## 2018-04-29 DIAGNOSIS — H66002 Acute suppurative otitis media without spontaneous rupture of ear drum, left ear: Secondary | ICD-10-CM | POA: Diagnosis not present

## 2018-04-29 NOTE — Telephone Encounter (Signed)
Noted. Can you contact [facility] to confirm that he has an appointment on Monday? Thanks.

## 2018-04-29 NOTE — Telephone Encounter (Signed)
He needs to be seen in person for the diarrhea. He was advised on Monday that he needed to go to the ED for evaluation. If he is still having abdominal pain with his diarrhea he needs to be seen in the ED for this. He can call EMS for transport or have someone drive him. If he is not having abdominal pain he will need to be seen at an urgent care. I will not continue to prescribe the tramadol long term. I discussed this with him previously and only refilled the tramadol to treat the acute pain issue he was having after his recent fall. At this time his pain is a chronic issue and needs to be managed by a specialist (orthopedics) if they are willing or a pain clinic. He has to see orthopedics. Please also let him know home health has been trying to contact him to get him set up with a social worker to help place him in a facility. He needs to take their call so they can help him.

## 2018-04-29 NOTE — Telephone Encounter (Signed)
Copied from [place] [id number]. Topic: General - Other >> Apr 29, 2018  9:50 AM Alanda Slim E wrote: Reason for CRM: Pt requested for Dr. Caryl Bis to call him about his health and some questions he has/ please advise

## 2018-04-29 NOTE — Telephone Encounter (Signed)
Called and spoke with pt. Pt advised and voiced understanding and stated that the social work is there with him right now.

## 2018-04-29 NOTE — Telephone Encounter (Signed)
SW [place] w/ [facility] called back to speak w/ Wilburn Cornelia.  SW was present w/ pt in his home.  SW said that pt was refusing to go to ED to be evaluated for diarrhea and abdominal pain symptoms.  SW said that pt did say he would go to his appt on Monday w/ the orthopedic doctor.   SW said that pt wanted to move out of the boarding house where he is currently staying.  SW stated that she will advise pt's niece to apply for housing through [employer] but because of [place] application will need to be completed on line.  CB# for SW [phone].

## 2018-04-29 NOTE — Telephone Encounter (Signed)
Pt is c/o frequent diarrhea stated 2 weeks ago can NOT stay out of the bathroom can't go to the store to get anything to use OTC. Pt stated that on Monday he is planning to go see the same doctor that did his left hip surgery but wanted to know if you can give him enough tramadol to last him until Monday? Pt currently has 5 pills left of tramadol.   Pt is planing to go to the place where he had his left hip surgery when he broke it but he cant recall the name of the office or the doctors name. Pt asked me what his balance was at the orthopedics and advised pt to reach out to them to find out and create a payment plan.   Sent to PCP

## 2018-05-01 NOTE — Telephone Encounter (Signed)
Called [facility] and they stated that they have called the pt twice and left a VM to call to schedule for an appt and also mailed him a letter and still have not heard from the patient.

## 2018-05-02 ENCOUNTER — Inpatient Hospital Stay: Payer: Medicare HMO

## 2018-05-02 ENCOUNTER — Emergency Department: Payer: Medicare HMO

## 2018-05-02 ENCOUNTER — Inpatient Hospital Stay
Admission: EM | Admit: 2018-05-02 | Discharge: 2018-05-26 | DRG: 853 | Disposition: A | Discharge status: Home / Self Care | Payer: Medicare HMO | Attending: Specialist | Admitting: Specialist

## 2018-05-02 ENCOUNTER — Other Ambulatory Visit: Payer: Self-pay

## 2018-05-02 ENCOUNTER — Encounter: Payer: Self-pay | Admitting: Internal Medicine

## 2018-05-02 DIAGNOSIS — K922 Gastrointestinal hemorrhage, unspecified: Secondary | ICD-10-CM | POA: Diagnosis not present

## 2018-05-02 DIAGNOSIS — K315 Obstruction of duodenum: Secondary | ICD-10-CM | POA: Diagnosis present

## 2018-05-02 DIAGNOSIS — I499 Cardiac arrhythmia, unspecified: Secondary | ICD-10-CM | POA: Diagnosis not present

## 2018-05-02 DIAGNOSIS — F1020 Alcohol dependence, uncomplicated: Secondary | ICD-10-CM | POA: Diagnosis present

## 2018-05-02 DIAGNOSIS — D759 Disease of blood and blood-forming organs, unspecified: Secondary | ICD-10-CM | POA: Diagnosis not present

## 2018-05-02 DIAGNOSIS — Z4659 Encounter for fitting and adjustment of other gastrointestinal appliance and device: Secondary | ICD-10-CM

## 2018-05-02 DIAGNOSIS — K567 Ileus, unspecified: Secondary | ICD-10-CM | POA: Diagnosis not present

## 2018-05-02 DIAGNOSIS — T68XXXA Hypothermia, initial encounter: Secondary | ICD-10-CM

## 2018-05-02 DIAGNOSIS — Z452 Encounter for adjustment and management of vascular access device: Secondary | ICD-10-CM | POA: Diagnosis not present

## 2018-05-02 DIAGNOSIS — R4182 Altered mental status, unspecified: Secondary | ICD-10-CM | POA: Diagnosis not present

## 2018-05-02 DIAGNOSIS — R680 Hypothermia, not associated with low environmental temperature: Secondary | ICD-10-CM | POA: Diagnosis not present

## 2018-05-02 DIAGNOSIS — R140 Abdominal distension (gaseous): Secondary | ICD-10-CM

## 2018-05-02 DIAGNOSIS — K264 Chronic or unspecified duodenal ulcer with hemorrhage: Secondary | ICD-10-CM | POA: Diagnosis present

## 2018-05-02 DIAGNOSIS — I959 Hypotension, unspecified: Secondary | ICD-10-CM | POA: Diagnosis not present

## 2018-05-02 DIAGNOSIS — K219 Gastro-esophageal reflux disease without esophagitis: Secondary | ICD-10-CM | POA: Diagnosis present

## 2018-05-02 DIAGNOSIS — Z66 Do not resuscitate: Secondary | ICD-10-CM | POA: Diagnosis present

## 2018-05-02 DIAGNOSIS — R062 Wheezing: Secondary | ICD-10-CM

## 2018-05-02 DIAGNOSIS — N183 Chronic kidney disease, stage 3 (moderate): Secondary | ICD-10-CM | POA: Diagnosis not present

## 2018-05-02 DIAGNOSIS — Z85820 Personal history of malignant melanoma of skin: Secondary | ICD-10-CM | POA: Diagnosis not present

## 2018-05-02 DIAGNOSIS — R578 Other shock: Secondary | ICD-10-CM | POA: Diagnosis not present

## 2018-05-02 DIAGNOSIS — A0472 Enterocolitis due to Clostridium difficile, not specified as recurrent: Secondary | ICD-10-CM

## 2018-05-02 DIAGNOSIS — I10 Essential (primary) hypertension: Secondary | ICD-10-CM | POA: Diagnosis not present

## 2018-05-02 DIAGNOSIS — E43 Unspecified severe protein-calorie malnutrition: Secondary | ICD-10-CM | POA: Diagnosis not present

## 2018-05-02 DIAGNOSIS — I129 Hypertensive chronic kidney disease with stage 1 through stage 4 chronic kidney disease, or unspecified chronic kidney disease: Secondary | ICD-10-CM | POA: Diagnosis not present

## 2018-05-02 DIAGNOSIS — I130 Hypertensive heart and chronic kidney disease with heart failure and stage 1 through stage 4 chronic kidney disease, or unspecified chronic kidney disease: Secondary | ICD-10-CM | POA: Diagnosis not present

## 2018-05-02 DIAGNOSIS — Z6820 Body mass index (BMI) 20.0-20.9, adult: Secondary | ICD-10-CM

## 2018-05-02 DIAGNOSIS — R748 Abnormal levels of other serum enzymes: Secondary | ICD-10-CM | POA: Diagnosis not present

## 2018-05-02 DIAGNOSIS — K625 Hemorrhage of anus and rectum: Secondary | ICD-10-CM | POA: Diagnosis not present

## 2018-05-02 DIAGNOSIS — F17210 Nicotine dependence, cigarettes, uncomplicated: Secondary | ICD-10-CM | POA: Diagnosis present

## 2018-05-02 DIAGNOSIS — J9601 Acute respiratory failure with hypoxia: Secondary | ICD-10-CM | POA: Diagnosis not present

## 2018-05-02 DIAGNOSIS — J690 Pneumonitis due to inhalation of food and vomit: Secondary | ICD-10-CM | POA: Diagnosis not present

## 2018-05-02 DIAGNOSIS — K2981 Duodenitis with bleeding: Secondary | ICD-10-CM | POA: Diagnosis not present

## 2018-05-02 DIAGNOSIS — R109 Unspecified abdominal pain: Secondary | ICD-10-CM

## 2018-05-02 DIAGNOSIS — G9341 Metabolic encephalopathy: Secondary | ICD-10-CM | POA: Diagnosis not present

## 2018-05-02 DIAGNOSIS — I509 Heart failure, unspecified: Secondary | ICD-10-CM | POA: Diagnosis present

## 2018-05-02 DIAGNOSIS — Z1159 Encounter for screening for other viral diseases: Secondary | ICD-10-CM

## 2018-05-02 DIAGNOSIS — R6521 Severe sepsis with septic shock: Secondary | ICD-10-CM | POA: Diagnosis not present

## 2018-05-02 DIAGNOSIS — A0811 Acute gastroenteropathy due to Norwalk agent: Secondary | ICD-10-CM | POA: Diagnosis present

## 2018-05-02 DIAGNOSIS — K209 Esophagitis, unspecified: Secondary | ICD-10-CM | POA: Diagnosis not present

## 2018-05-02 DIAGNOSIS — R143 Flatulence: Secondary | ICD-10-CM | POA: Diagnosis not present

## 2018-05-02 DIAGNOSIS — N189 Chronic kidney disease, unspecified: Secondary | ICD-10-CM | POA: Diagnosis not present

## 2018-05-02 DIAGNOSIS — R6510 Systemic inflammatory response syndrome (SIRS) of non-infectious origin without acute organ dysfunction: Secondary | ICD-10-CM | POA: Diagnosis not present

## 2018-05-02 DIAGNOSIS — Z9889 Other specified postprocedural states: Secondary | ICD-10-CM | POA: Diagnosis not present

## 2018-05-02 DIAGNOSIS — G8929 Other chronic pain: Secondary | ICD-10-CM | POA: Diagnosis not present

## 2018-05-02 DIAGNOSIS — Z931 Gastrostomy status: Secondary | ICD-10-CM | POA: Diagnosis not present

## 2018-05-02 DIAGNOSIS — R571 Hypovolemic shock: Secondary | ICD-10-CM | POA: Diagnosis present

## 2018-05-02 DIAGNOSIS — R579 Shock, unspecified: Secondary | ICD-10-CM

## 2018-05-02 DIAGNOSIS — Z978 Presence of other specified devices: Secondary | ICD-10-CM

## 2018-05-02 DIAGNOSIS — K269 Duodenal ulcer, unspecified as acute or chronic, without hemorrhage or perforation: Secondary | ICD-10-CM | POA: Diagnosis not present

## 2018-05-02 DIAGNOSIS — R69 Illness, unspecified: Secondary | ICD-10-CM | POA: Diagnosis not present

## 2018-05-02 DIAGNOSIS — G894 Chronic pain syndrome: Secondary | ICD-10-CM | POA: Diagnosis present

## 2018-05-02 DIAGNOSIS — R0602 Shortness of breath: Secondary | ICD-10-CM

## 2018-05-02 DIAGNOSIS — K449 Diaphragmatic hernia without obstruction or gangrene: Secondary | ICD-10-CM | POA: Diagnosis not present

## 2018-05-02 DIAGNOSIS — K921 Melena: Secondary | ICD-10-CM | POA: Diagnosis not present

## 2018-05-02 DIAGNOSIS — Z8249 Family history of ischemic heart disease and other diseases of the circulatory system: Secondary | ICD-10-CM

## 2018-05-02 DIAGNOSIS — J189 Pneumonia, unspecified organism: Secondary | ICD-10-CM | POA: Diagnosis not present

## 2018-05-02 DIAGNOSIS — I6389 Other cerebral infarction: Secondary | ICD-10-CM | POA: Diagnosis not present

## 2018-05-02 DIAGNOSIS — G2581 Restless legs syndrome: Secondary | ICD-10-CM | POA: Diagnosis not present

## 2018-05-02 DIAGNOSIS — N179 Acute kidney failure, unspecified: Secondary | ICD-10-CM | POA: Diagnosis present

## 2018-05-02 DIAGNOSIS — K5931 Toxic megacolon: Secondary | ICD-10-CM

## 2018-05-02 DIAGNOSIS — D62 Acute posthemorrhagic anemia: Secondary | ICD-10-CM | POA: Diagnosis not present

## 2018-05-02 DIAGNOSIS — I700 Atherosclerosis of aorta: Secondary | ICD-10-CM | POA: Diagnosis not present

## 2018-05-02 DIAGNOSIS — A419 Sepsis, unspecified organism: Principal | ICD-10-CM | POA: Diagnosis present

## 2018-05-02 DIAGNOSIS — R1013 Epigastric pain: Secondary | ICD-10-CM | POA: Diagnosis not present

## 2018-05-02 DIAGNOSIS — R918 Other nonspecific abnormal finding of lung field: Secondary | ICD-10-CM | POA: Diagnosis not present

## 2018-05-02 HISTORY — DX: Enterocolitis due to Clostridium difficile, not specified as recurrent: A04.72

## 2018-05-02 LAB — MAGNESIUM: Magnesium: 2.4 mg/dL (ref 1.7–2.4)

## 2018-05-02 LAB — CBC WITH DIFFERENTIAL/PLATELET
Abs Immature Granulocytes: 0.42 10*3/uL — ABNORMAL HIGH (ref 0.00–0.07)
Basophils Absolute: 0 10*3/uL (ref 0.0–0.1)
Basophils Relative: 0 %
Eosinophils Absolute: 0.1 10*3/uL (ref 0.0–0.5)
Eosinophils Relative: 1 %
HCT: 10.7 % — CL (ref 39.0–52.0)
Hemoglobin: 3.2 g/dL — CL (ref 13.0–17.0)
Immature Granulocytes: 2 %
Lymphocytes Relative: 1 %
Lymphs Abs: 0.3 10*3/uL — ABNORMAL LOW (ref 0.7–4.0)
MCH: 29.4 pg (ref 26.0–34.0)
MCHC: 29.9 g/dL — ABNORMAL LOW (ref 30.0–36.0)
MCV: 98.2 fL (ref 80.0–100.0)
Monocytes Absolute: 0.8 10*3/uL (ref 0.1–1.0)
Monocytes Relative: 4 %
Neutro Abs: 19.8 10*3/uL — ABNORMAL HIGH (ref 1.7–7.7)
Neutrophils Relative %: 92 %
Platelets: 514 10*3/uL — ABNORMAL HIGH (ref 150–400)
RBC: 1.09 MIL/uL — ABNORMAL LOW (ref 4.22–5.81)
RDW: 17.6 % — ABNORMAL HIGH (ref 11.5–15.5)
WBC: 21.4 10*3/uL — ABNORMAL HIGH (ref 4.0–10.5)
nRBC: 2.2 % — ABNORMAL HIGH (ref 0.0–0.2)

## 2018-05-02 LAB — GASTROINTESTINAL PANEL BY PCR, STOOL (REPLACES STOOL CULTURE)

## 2018-05-02 LAB — COMPREHENSIVE METABOLIC PANEL
ALT: 16 U/L (ref 0–44)
AST: 31 U/L (ref 15–41)
Albumin: 2.7 g/dL — ABNORMAL LOW (ref 3.5–5.0)
Alkaline Phosphatase: 130 U/L — ABNORMAL HIGH (ref 38–126)
Anion gap: 24 — ABNORMAL HIGH (ref 5–15)
BUN: 81 mg/dL — ABNORMAL HIGH (ref 8–23)
CO2: 13 mmol/L — ABNORMAL LOW (ref 22–32)
Calcium: 8.7 mg/dL — ABNORMAL LOW (ref 8.9–10.3)
Chloride: 103 mmol/L (ref 98–111)
Creatinine, Ser: 3.25 mg/dL — ABNORMAL HIGH (ref 0.61–1.24)
GFR calc Af Amer: 19 mL/min — ABNORMAL LOW (ref >60)
GFR calc non Af Amer: 17 mL/min — ABNORMAL LOW (ref >60)
Glucose, Bld: 195 mg/dL — ABNORMAL HIGH (ref 70–99)
Potassium: 4.9 mmol/L (ref 3.5–5.1)
Sodium: 140 mmol/L (ref 135–145)
Total Bilirubin: 0.6 mg/dL (ref 0.3–1.2)
Total Protein: 6.1 g/dL — ABNORMAL LOW (ref 6.5–8.1)

## 2018-05-02 LAB — TROPONIN I
Troponin I: 0.07 ng/mL (ref <0.03)
Troponin I: 0.05 ng/mL (ref <0.03)

## 2018-05-02 LAB — URINALYSIS, COMPLETE (UACMP) WITH MICROSCOPIC
Bacteria, UA: NONE SEEN
Bilirubin Urine: NEGATIVE
Glucose, UA: NEGATIVE mg/dL
Hgb urine dipstick: NEGATIVE
Ketones, ur: NEGATIVE mg/dL
Leukocytes,Ua: NEGATIVE
Nitrite: NEGATIVE
Protein, ur: NEGATIVE mg/dL
Specific Gravity, Urine: 1.012 (ref 1.005–1.030)
pH: 5 (ref 5.0–8.0)

## 2018-05-02 LAB — CBC
HCT: 18.4 % — ABNORMAL LOW (ref 39.0–52.0)
HCT: 26.2 % — ABNORMAL LOW (ref 39.0–52.0)
Hemoglobin: 5.8 g/dL — ABNORMAL LOW (ref 13.0–17.0)
Hemoglobin: 8.9 g/dL — ABNORMAL LOW (ref 13.0–17.0)
MCH: 29.3 pg (ref 26.0–34.0)
MCH: 29.6 pg (ref 26.0–34.0)
MCHC: 31.5 g/dL (ref 30.0–36.0)
MCHC: 34 g/dL (ref 30.0–36.0)
MCV: 92.9 fL (ref 80.0–100.0)
MCV: 87 fL (ref 80.0–100.0)
Platelets: 390 10*3/uL (ref 150–400)
Platelets: 402 10*3/uL — ABNORMAL HIGH (ref 150–400)
RBC: 1.98 MIL/uL — ABNORMAL LOW (ref 4.22–5.81)
RBC: 3.01 MIL/uL — ABNORMAL LOW (ref 4.22–5.81)
RDW: 16.3 % — ABNORMAL HIGH (ref 11.5–15.5)
RDW: 15.4 % (ref 11.5–15.5)
WBC: 22.7 10*3/uL — ABNORMAL HIGH (ref 4.0–10.5)
WBC: 29.5 10*3/uL — ABNORMAL HIGH (ref 4.0–10.5)
nRBC: 2.5 % — ABNORMAL HIGH (ref 0.0–0.2)
nRBC: 4.4 % — ABNORMAL HIGH (ref 0.0–0.2)

## 2018-05-02 LAB — LACTIC ACID, PLASMA
Lactic Acid, Venous: >11 mmol/L (ref 0.5–1.9)
Lactic Acid, Venous: 10.4 mmol/L (ref 0.5–1.9)
Lactic Acid, Venous: 3.1 mmol/L (ref 0.5–1.9)

## 2018-05-02 LAB — PHOSPHORUS: Phosphorus: 5 mg/dL — ABNORMAL HIGH (ref 2.5–4.6)

## 2018-05-02 LAB — GLUCOSE, CAPILLARY
Glucose-Capillary: 135 mg/dL — ABNORMAL HIGH (ref 70–99)
Glucose-Capillary: 124 mg/dL — ABNORMAL HIGH (ref 70–99)

## 2018-05-02 LAB — SARS CORONAVIRUS 2 BY RT PCR (HOSPITAL ORDER, PERFORMED IN ~~LOC~~ HOSPITAL LAB): SARS Coronavirus 2: NEGATIVE

## 2018-05-02 LAB — BASIC METABOLIC PANEL
Anion gap: 13 (ref 5–15)
BUN: 78 mg/dL — ABNORMAL HIGH (ref 8–23)
CO2: 18 mmol/L — ABNORMAL LOW (ref 22–32)
Calcium: 7.1 mg/dL — ABNORMAL LOW (ref 8.9–10.3)
Chloride: 110 mmol/L (ref 98–111)
Creatinine, Ser: 2.9 mg/dL — ABNORMAL HIGH (ref 0.61–1.24)
GFR calc Af Amer: 22 mL/min — ABNORMAL LOW (ref >60)
GFR calc non Af Amer: 19 mL/min — ABNORMAL LOW (ref >60)
Glucose, Bld: 130 mg/dL — ABNORMAL HIGH (ref 70–99)
Potassium: 4 mmol/L (ref 3.5–5.1)
Sodium: 141 mmol/L (ref 135–145)

## 2018-05-02 LAB — PREPARE RBC (CROSSMATCH)

## 2018-05-02 LAB — C DIFFICILE QUICK SCREEN W PCR REFLEX
C Diff antigen: POSITIVE — AB
C Diff toxin: NEGATIVE

## 2018-05-02 LAB — CLOSTRIDIUM DIFFICILE BY PCR, REFLEXED: Toxigenic C. Difficile by PCR: POSITIVE — AB

## 2018-05-02 LAB — CK: Total CK: 92 U/L (ref 49–397)

## 2018-05-02 LAB — PROTIME-INR
INR: 1.1 (ref 0.8–1.2)
Prothrombin Time: 14.5 seconds (ref 11.4–15.2)

## 2018-05-02 LAB — HEMOGLOBIN: Hemoglobin: 8.4 g/dL — ABNORMAL LOW (ref 13.0–17.0)

## 2018-05-02 LAB — MRSA PCR SCREENING: MRSA by PCR: NEGATIVE

## 2018-05-02 MED ORDER — ALPRAZOLAM 0.5 MG PO TABS
0.5000 mg | ORAL_TABLET | Freq: Every evening | ORAL | Status: DC | PRN
  Administered 2018-05-03 – 2018-05-07 (×6): 0.5 mg via ORAL
  Filled 2018-05-02 (×6): qty 1

## 2018-05-02 MED ORDER — METRONIDAZOLE IN NACL 5-0.79 MG/ML-% IV SOLN
500.0000 mg | Freq: Three times a day (TID) | INTRAVENOUS | Status: DC
  Administered 2018-05-02 – 2018-05-04 (×6): 500 mg via INTRAVENOUS
  Filled 2018-05-02 (×7): qty 100

## 2018-05-02 MED ORDER — PIPERACILLIN-TAZOBACTAM 3.375 G IVPB 30 MIN: 3.3750 g | Freq: Three times a day (TID) | INTRAVENOUS | Status: DC

## 2018-05-02 MED ORDER — METRONIDAZOLE IN NACL 5-0.79 MG/ML-% IV SOLN
500.0000 mg | Freq: Once | INTRAVENOUS | Status: AC
  Administered 2018-05-02: 500 mg via INTRAVENOUS
  Filled 2018-05-02: qty 100

## 2018-05-02 MED ORDER — DOCUSATE SODIUM 100 MG PO CAPS
100.0000 mg | ORAL_CAPSULE | Freq: Two times a day (BID) | ORAL | Status: DC | PRN
  Filled 2018-05-02: qty 1

## 2018-05-02 MED ORDER — FENTANYL CITRATE (PF) 100 MCG/2ML IJ SOLN
25.0000 ug | Freq: Once | INTRAMUSCULAR | Status: AC
  Administered 2018-05-02: 08:00:00 25 ug via INTRAVENOUS
  Filled 2018-05-02: qty 2

## 2018-05-02 MED ORDER — SODIUM CHLORIDE 0.9 % IV SOLN
10.0000 mL/h | Freq: Once | INTRAVENOUS | Status: AC
  Administered 2018-05-03: 18:00:00 via INTRAVENOUS

## 2018-05-02 MED ORDER — TECHNETIUM TC 99M-LABELED RED BLOOD CELLS IV KIT
21.9700 | PACK | Freq: Once | INTRAVENOUS | Status: AC | PRN
  Administered 2018-05-02: 21.97 via INTRAVENOUS

## 2018-05-02 MED ORDER — PANTOPRAZOLE SODIUM 40 MG IV SOLR
40.0000 mg | Freq: Two times a day (BID) | INTRAVENOUS | Status: DC
  Administered 2018-05-02 – 2018-05-04 (×5): 40 mg via INTRAVENOUS
  Filled 2018-05-02 (×5): qty 40

## 2018-05-02 MED ORDER — SODIUM CHLORIDE 0.9 % IV BOLUS
500.0000 mL | Freq: Once | INTRAVENOUS | Status: AC
  Administered 2018-05-02: 500 mL via INTRAVENOUS

## 2018-05-02 MED ORDER — PIPERACILLIN-TAZOBACTAM 3.375 G IVPB
3.3750 g | Freq: Three times a day (TID) | INTRAVENOUS | Status: DC
  Administered 2018-05-02: 3.375 g via INTRAVENOUS
  Filled 2018-05-02: qty 50

## 2018-05-02 MED ORDER — DEXTROSE-NACL 5-0.45 % IV SOLN
INTRAVENOUS | Status: DC
  Administered 2018-05-02 – 2018-05-04 (×5): via INTRAVENOUS

## 2018-05-02 MED ORDER — PANTOPRAZOLE SODIUM 40 MG IV SOLR
40.0000 mg | Freq: Once | INTRAVENOUS | Status: AC
  Administered 2018-05-02: 40 mg via INTRAVENOUS
  Filled 2018-05-02: qty 40

## 2018-05-02 MED ORDER — SODIUM CHLORIDE 0.9% IV SOLUTION
Freq: Once | INTRAVENOUS | Status: AC
  Administered 2018-05-02: 09:00:00 via INTRAVENOUS
  Filled 2018-05-02: qty 250

## 2018-05-02 MED ORDER — SODIUM CHLORIDE 0.9 % IV BOLUS
1000.0000 mL | Freq: Once | INTRAVENOUS | Status: AC
  Administered 2018-05-02: 1000 mL via INTRAVENOUS

## 2018-05-02 MED ORDER — VANCOMYCIN 50 MG/ML ORAL SOLUTION
500.0000 mg | Freq: Four times a day (QID) | ORAL | Status: DC
  Administered 2018-05-02 – 2018-05-04 (×7): 500 mg via ORAL
  Filled 2018-05-02 (×13): qty 10

## 2018-05-02 MED ORDER — SODIUM CHLORIDE 0.9 % IV SOLN: INTRAVENOUS | Status: DC

## 2018-05-02 NOTE — ED Notes (Signed)
Pt given some mouth swabs and reoriented to why patient is in hospital.

## 2018-05-02 NOTE — ED Triage Notes (Signed)
Pt arrives from home, was lying on floor in feces for unknown time. Pt arrives hypothermic, covered in dried stool from head to toe. Pt states he has a headahce. No obvious trauma.

## 2018-05-02 NOTE — ED Notes (Signed)
Admitting MD at bedside.

## 2018-05-02 NOTE — Consult Note (Signed)
Name: Justin Andrews MRN: [id number] DOB: July 15, 1934     CONSULTATION DATE: 05/02/2018  REFERRING MD : vaichani  CHIEF COMPLAINT: confusions    COVID-19 NEGATIVE: Acute COVID-19 infection ruled out by PCR.    HISTORY OF PRESENT ILLNESS:   84 yo white male with multiple medical issues presents iwth diarrhea and hypothyermia +diarrhea +c diff history 6 months ago +abd pain S/p fall on the floor for several days Severe anemia and hypothermia with acute renal failure HBG 3.2 Was given IVF's and 2 units PRBC's Transferred to ICU for further care and management  CBC    Component Value Date/Time   WBC 22.7 (H) 05/02/2018 0937   RBC 1.98 (L) 05/02/2018 0937   HGB 5.8 (L) 05/02/2018 0937   HCT 18.4 (L) 05/02/2018 0937   PLT 390 05/02/2018 0937   MCV 92.9 05/02/2018 0937   MCH 29.3 05/02/2018 0937   MCHC 31.5 05/02/2018 0937   RDW 16.3 (H) 05/02/2018 0937   LYMPHSABS 0.3 (L) 05/02/2018 0653   MONOABS 0.8 05/02/2018 0653   EOSABS 0.1 05/02/2018 0653   BASOSABS 0.0 05/02/2018 0653   BMP Latest Ref Rng & Units 05/02/2018 03/18/2018 03/13/2018  Glucose 70 - 99 mg/dL 195(H) 131(H) 95  BUN 8 - 23 mg/dL 81(H) 35(H) 24(H)  Creatinine 0.61 - 1.24 mg/dL 3.25(H) 1.85(H) 1.81(H)  BUN/Creat Ratio 6 - 22 (calc) - - -  Sodium 135 - 145 mmol/L 140 137 142  Potassium 3.5 - 5.1 mmol/L 4.9 4.2 3.8  Chloride 98 - 111 mmol/L 103 99 107  CO2 22 - 32 mmol/L 13(L) 25 18(L)  Calcium 8.9 - 10.3 mg/dL 8.7(L) 9.7 9.2      PAST MEDICAL HISTORY :   has a past medical history of C. difficile diarrhea (11/2017), Elevated serum creatinine, History of melanoma, Hypertension, Osteoarthritis, and Tobacco abuse.  has a past surgical history that includes External ear surgery; Tonsillectomy; Rotator cuff repair; Ankle fracture surgery; Tympanoplasty w/ mastoidectomy; and Intramedullary (im) nail intertrochanteric (Left, 12/01/2017). Prior to Admission medications   Medication Sig Start Date End Date Taking?  Authorizing Provider  amLODipine (NORVASC) 10 MG tablet Take 1 tablet (10 mg total) by mouth daily. 09/24/17  Yes Leone Haven, MD  calcium-vitamin D (OSCAL WITH D) 500-200 MG-UNIT tablet Take 1 tablet by mouth 2 (two) times daily. 12/03/17  Yes Fritzi Mandes, MD  carvedilol (COREG) 6.25 MG tablet Take 2 tablets (12.5 mg total) by mouth 2 (two) times daily with a meal. 12/03/17  Yes Fritzi Mandes, MD  [employer] VITAMIN B12 1000 MCG tablet TAKE 1 TABLET BY MOUTH EVERY DAY 11/19/17  Yes Leone Haven, MD  diclofenac sodium (VOLTAREN) 1 % GEL Apply 2 g topically 4 (four) times daily as needed (left hip pain). 03/26/18  Yes Leone Haven, MD  ferrous sulfate 325 (65 FE) MG tablet Take 1 tablet (325 mg total) by mouth daily with breakfast. 04/23/18  Yes Leone Haven, MD  folic acid (FOLVITE) 1 MG tablet TAKE 1 TABLET BY MOUTH EVERY DAY 02/05/17  Yes Leone Haven, MD  gabapentin (NEURONTIN) 100 MG capsule Take 2 capsules (200 mg total) by mouth 3 (three) times daily. 12/03/17  Yes Fritzi Mandes, MD  Multiple Vitamin (MULTIVITAMIN WITH MINERALS) TABS tablet Take 1 tablet by mouth daily. 12/03/17  Yes Fritzi Mandes, MD  ranitidine (ZANTAC) 150 MG tablet Take 150 mg by mouth 2 (two) times daily.   Yes [provider]  tamsulosin (FLOMAX) 0.4 MG  CAPS capsule Take 1 capsule (0.4 mg total) by mouth daily. 10/25/17  Yes Leone Haven, MD  thiamine 100 MG tablet Take 1 tablet (100 mg total) by mouth daily. 12/03/17  Yes Fritzi Mandes, MD  traMADol (ULTRAM) 50 MG tablet Take 1 tablet (50 mg total) by mouth every 8 (eight) hours as needed. 04/27/18  Yes Leone Haven, MD  venlafaxine XR (EFFEXOR-XR) 37.5 MG 24 hr capsule Take 1 tablet by mouth once daily for 7 days starting on 04/01/18, then take 2 tablets by mouth once daily 03/26/18  Yes Leone Haven, MD  vitamin C (VITAMIN C) 250 MG tablet Take 1 tablet (250 mg total) by mouth 2 (two) times daily. 12/03/17  Yes Fritzi Mandes, MD    feeding supplement, ENSURE ENLIVE, (ENSURE ENLIVE) LIQD Take 237 mLs by mouth 2 (two) times daily between meals. 12/03/17   Fritzi Mandes, MD   Allergies  Allergen Reactions   Aspirin Other (See Comments)    bleeding    FAMILY HISTORY:  HTN  SOCIAL HISTORY:  reports that he has been smoking cigarettes. He has a 70.00 pack-year smoking history. He has never used smokeless tobacco. He reports that he does not drink alcohol or use drugs.  REVIEW OF SYSTEMS:   Unable to obtain due to critical illness/confusion   VITAL SIGNS: Temp:  [89.6 F (32 C)-96.1 F (35.6 C)] 96.1 F (35.6 C) (04/25 1108) Pulse Rate:  [49-103] 102 (04/25 1108) Resp:  [12-40] 22 (04/25 1108) BP: (85-154)/(24-88) 138/68 (04/25 1108) SpO2:  [94 %-100 %] 100 % (04/25 1108) Weight:  [54 kg] 54 kg (04/25 0644)  Physical Examination:  GENERAL:critically ill appearing HEAD: Normocephalic, atraumatic.  EYES: Pupils equal, round, reactive to light.  No scleral icterus.  MOUTH: Moist mucosal membrane. NECK: Supple. No JVD.  PULMONARY: +rhonchi,  CARDIOVASCULAR: S1 and S2. Regular rate and rhythm. No murmurs, rubs, or gallops.  GASTROINTESTINAL: Soft, nontender, -distended. No masses. Positive bowel sounds. No hepatosplenomegaly.  MUSCULOSKELETAL: No swelling, clubbing, or edema.  NEUROLOGIC:lethargic SKIN:intact,warm,dry     CULTURE RESULTS   Recent Results (from the past 240 hour(s))  SARS Coronavirus 2 [facility] order, Performed in [facility] hospital lab)     Status: None   Collection Time: 05/02/18  7:41 AM  Result Value Ref Range Status   SARS Coronavirus 2 NEGATIVE NEGATIVE Final    Comment: (NOTE) If result is NEGATIVE SARS-CoV-2 target nucleic acids are NOT DETECTED. The SARS-CoV-2 RNA is generally detectable in upper and lower  respiratory specimens during the acute phase of infection. The lowest  concentration of SARS-CoV-2 viral copies this assay can detect is 250  copies / mL. A negative  result does not preclude SARS-CoV-2 infection  and should not be used as the sole basis for treatment or other  patient management decisions.  A negative result may occur with  improper specimen collection / handling, submission of specimen other  than nasopharyngeal swab, presence of viral mutation(s) within the  areas targeted by this assay, and inadequate number of viral copies  (<250 copies / mL). A negative result must be combined with clinical  observations, patient history, and epidemiological information. If result is POSITIVE SARS-CoV-2 target nucleic acids are DETECTED. The SARS-CoV-2 RNA is generally detectable in upper and lower  respiratory specimens dur ing the acute phase of infection.  Positive  results are indicative of active infection with SARS-CoV-2.  Clinical  correlation with patient history and other diagnostic information is  necessary to  determine patient infection status.  Positive results do  not rule out bacterial infection or co-infection with other viruses. If result is PRESUMPTIVE POSTIVE SARS-CoV-2 nucleic acids MAY BE PRESENT.   A presumptive positive result was obtained on the submitted specimen  and confirmed on repeat testing.  While 2019 novel coronavirus  (SARS-CoV-2) nucleic acids may be present in the submitted sample  additional confirmatory testing may be necessary for epidemiological  and / or clinical management purposes  to differentiate between  SARS-CoV-2 and other Sarbecovirus currently known to infect humans.  If clinically indicated additional testing with an alternate test  methodology [phone]) is advised. The SARS-CoV-2 RNA is generally  detectable in upper and lower respiratory sp ecimens during the acute  phase of infection. The expected result is Negative. Fact Sheet for Patients:  StrictlyIdeas.no Fact Sheet for Healthcare Providers: BankingDealers.co.za This test is not yet  approved or cleared by the [place] FDA and has been authorized for detection and/or diagnosis of SARS-CoV-2 by FDA under an Emergency Use Authorization (EUA).  This EUA will remain in effect (meaning this test can be used) for the duration of the COVID-19 declaration under Section 564(b)(1) of the Act, 21 U.S.C. section 360bbb-3(b)(1), unless the authorization is terminated or revoked sooner. Performed at [facility], [place], [place] [address]   C Difficile Quick Screen w PCR reflex     Status: Abnormal   Collection Time: 05/02/18  8:42 AM  Result Value Ref Range Status   C Diff antigen POSITIVE (A) NEGATIVE Final   C Diff toxin NEGATIVE NEGATIVE Final   C Diff interpretation Results are indeterminate. See PCR results.  Final    Comment: Performed at [facility], [address] [place], [place] [address]  Gastrointestinal Panel by PCR , Stool     Status: Abnormal   Collection Time: 05/02/18  8:42 AM  Result Value Ref Range Status   Campylobacter species NOT DETECTED NOT DETECTED Final   Plesimonas shigelloides NOT DETECTED NOT DETECTED Final   Salmonella species NOT DETECTED NOT DETECTED Final   Yersinia enterocolitica NOT DETECTED NOT DETECTED Final   Vibrio species NOT DETECTED NOT DETECTED Final   Vibrio cholerae NOT DETECTED NOT DETECTED Final   Enteroaggregative E coli (EAEC) NOT DETECTED NOT DETECTED Final   Enteropathogenic E coli (EPEC) NOT DETECTED NOT DETECTED Final   Enterotoxigenic E coli (ETEC) NOT DETECTED NOT DETECTED Final   Shiga like toxin producing E coli (STEC) NOT DETECTED NOT DETECTED Final   Shigella/Enteroinvasive E coli (EIEC) NOT DETECTED NOT DETECTED Final   Cryptosporidium NOT DETECTED NOT DETECTED Final   Cyclospora cayetanensis NOT DETECTED NOT DETECTED Final   Entamoeba histolytica NOT DETECTED NOT DETECTED Final   Giardia lamblia NOT DETECTED NOT DETECTED Final   Adenovirus F40/41 NOT DETECTED NOT  DETECTED Final   Astrovirus NOT DETECTED NOT DETECTED Final   Norovirus GI/GII DETECTED (A) NOT DETECTED Final    Comment: RESULT CALLED TO, READ BACK BY AND VERIFIED WITH: JESSICA COLTRANE AT 1059 05/02/2018.PMF    Rotavirus A NOT DETECTED NOT DETECTED Final   Sapovirus (I, II, IV, and V) NOT DETECTED NOT DETECTED Final    Comment: Performed at [facility], [place], [place] [address]          IMAGING    Ct Abdomen Pelvis Wo Contrast  Result Date: 05/02/2018 CLINICAL DATA:  84 year old male found down covered in his own feces. EXAM: CT ABDOMEN AND  PELVIS WITHOUT CONTRAST TECHNIQUE: Multidetector CT imaging of the abdomen and pelvis was performed following the standard protocol without IV contrast. COMPARISON:  CT the abdomen and pelvis 11/30/2017. FINDINGS: Lower chest: Atherosclerotic calcifications in the thoracic aorta as well as the right coronary artery. Diffuse low-attenuation throughout the intravascular compartment, suggestive of anemia. Hepatobiliary: No definite suspicious cystic or solid hepatic lesions are confidently identified on today's noncontrast CT examination. Unenhanced appearance of the gallbladder is unremarkable. Pancreas: No definite pancreatic mass or peripancreatic fluid or inflammatory changes identified on today's noncontrast CT examination. Spleen: Unremarkable. Adrenals/Urinary Tract: Mild bilateral renal atrophy. Low-attenuation lesions in the left kidney, incompletely characterized on today's non-contrast CT examination, but likely to represent cysts, measuring up to 2.5 cm in the upper pole. Bilateral adrenal glands are normal in appearance. No hydroureteronephrosis. Urinary bladder is normal in appearance. Stomach/Bowel: Normal appearance of the stomach. No pathologic dilatation of small bowel or colon. Rectal temperature probe in place. Normal appendix. Vascular/Lymphatic: Aortic atherosclerosis. No lymphadenopathy noted in the abdomen  or pelvis. Reproductive: Prostate gland and seminal vesicles are unremarkable in appearance. Other: No significant volume of ascites.  No pneumoperitoneum. Musculoskeletal: Orthopedic fixation hardware in the left proximal femur incidentally noted. There are no aggressive appearing lytic or blastic lesions noted in the visualized portions of the skeleton. IMPRESSION: 1. No acute findings are noted in the abdomen or pelvis. 2. Aortic atherosclerosis, in addition to at least right coronary artery disease. 3. Low-attenuation throughout the intravascular compartment, suggestive of anemia. 4. Additional incidental findings, as above. Electronically Signed   By: Vinnie Langton M.D.   On: 05/02/2018 08:31   Dg Chest 1 View  Result Date: 05/02/2018 CLINICAL DATA:  Patient hypothermic. Headache. EXAM: CHEST  1 VIEW COMPARISON:  11/30/2017. FINDINGS: Heart is enlarged. Calcified tortuous aorta. Multiple healed rib fractures. No consolidation or edema. Mild interstitial prominence likely related to chronic tobacco abuse. IMPRESSION: Cardiomegaly. No active disease. Electronically Signed   By: Staci Righter M.D.   On: 05/02/2018 07:12   Ct Head Wo Contrast  Result Date: 05/02/2018 CLINICAL DATA:  Patient found lying on floor known feces. Patient is hypothermic from head to toe. No obvious trauma. EXAM: CT HEAD WITHOUT CONTRAST TECHNIQUE: Contiguous axial images were obtained from the base of the skull through the vertex without intravenous contrast. COMPARISON:  November 30, 2016 FINDINGS: Brain: No subdural, epidural, or subarachnoid hemorrhage. Brainstem, basal cisterns, and cerebellum are unchanged. Probable small lacunar infarct just to the left of midline in the cerebellum on axial image 9, unchanged since November 2011. Ventricles and sulci are prominent but stable. Chronic white matter changes are again identified. A small lacunar infarct in the posterior limb of the left internal capsule on axial image 19 is  slightly more conspicuous in the interval. No acute cortical ischemia or infarct noted. No mass effect or midline shift. Vascular: No hyperdense vessel or unexpected calcification. Skull: Normal. Negative for fracture or focal lesion. Sinuses/Orbits: Mucosal thickening in the right maxillary sinus. Paranasal sinuses otherwise normal. The right mastoid air cells and right middle ear are well aerated. Patient is status post left mastoidectomy with chronically opacified middle ear. Other: None. IMPRESSION: 1. A lacunar infarct in the posterior limb of the left internal capsule is more conspicuous in the interval. The difference could be due to difference in slice selection. No other acute intracranial abnormalities. Electronically Signed   By: Dorise Bullion III M.D   On: 05/02/2018 08:36  ASSESSMENT AND PLAN SYNOPSIS  84 yo WM with acute metabolic encephalopathy with severe acute blood loss anemia with hypovolumic shock with severe hypothermia with acute renal failure   ACUTE ANEMIA- TRANSFUSE AS NEEDED DVT PRX with TED/SCD's ONLY   ACUTE KIDNEY INJURY/Renal Failure -follow chem 7 -follow UO -continue Foley Catheter-assess need   NEUROLOGY Avoid sedatives   Shock-hypovolumic -use vasopressors to keep MAP>65 -follow ABG and LA -follow up cultures -emperic ABX -consider stress dose steroids -aggressive IV fluid resuscitation  CARDIAC ICU monitoring  ID h/o c diff -continue IV abx as prescibed -follow up cultures On zosyn and flagyl   ENDO - ICU hypoglycemic\Hyperglycemia protocol -check FSBS per protocol   ELECTROLYTES -follow labs as needed -replace as needed -pharmacy consultation and following   DVT/GI PRX ordered TRANSFUSIONS AS NEEDED MONITOR FSBS ASSESS the need for LABS as needed   Critical Care Time devoted to patient care services described in this note is 35 minutes.   Overall, patient is critically ill, prognosis is guarded.  Patient with  Multiorgan failure and at high risk for cardiac arrest and death.    Corrin Parker, M.D.  [facility]  Medical Director [place] Director [facility] Cardio-Pulmonary Department

## 2018-05-02 NOTE — Progress Notes (Signed)
Family Meeting Note  Advance Directive:yes  Today a meeting took place with the Son. We spoke on phone.  Due to COVID-19 pandemic, currently hospital is not allowing any visitors for the patient's in the hospital.  V discussed on phone regarding patient's condition plan and directives.  Patient is unable to participate due [facility]UC[facility] capacity Confusion.   The following clinical team members were present during this meeting:MD  The following were discussed:Patient's diagnosis: GI bleed, hemorrhagic shock, acute on chronic renal failure, Patient's progosis: Unable to determine and Goals for treatment: DNR  Son agreed on blood transfusion and treating other conditions but does not want CPR and intubation in any adverse event.  Additional follow-up to be provided: GI, intensivist.  Time spent during discussion:20 minutes  Vaughan Basta, MD

## 2018-05-02 NOTE — ED Notes (Signed)
Pt placed on on beir hugger on high on arrival for hypothermia. Pt sitting up at 60 degrees to prevent aspiration. Pt now complains of abdominal pain and nausea. Report to sarah, rn.

## 2018-05-02 NOTE — ED Provider Notes (Addendum)
[facility] Emergency Department Provider Note ____________________________________________   First MD Initiated Contact with Patient 05/02/18 [phone]     (approximate)  I have reviewed the triage vital signs and the nursing notes.   HISTORY  Chief Complaint Fall and Cold Exposure  EM caveat: The patient is confused, he is a poor historian and cannot tell me recent events.  He seems confused  HPI Justin Andrews is a 84 y.o. male known history of hypertension, depression, hip fracture, recently talked to his primary doctor and had concerns of diarrhea and there is a questionable history of prior C. Difficile  Patient reports he is having abdominal pain.  He also is very thirsty.  He cannot really give much additional history.  He says he is in [place] hurting his neck and back are hurting.  He denies that his hips are causing him pain but reports a lot of pain in his stomach.  He is not certain what caused him to come in today.  Per EMS, was found down by neighbors who think he may be of been on the floor for a few days   Past Medical History:  Diagnosis Date   Elevated serum creatinine    History of melanoma    Hypertension    Osteoarthritis    Tobacco abuse     Patient Active Problem List   Diagnosis Date Noted   RLS (restless legs syndrome) 04/22/2018   Wrist pain 04/22/2018   Right hip pain 04/08/2018   Otitis media 03/19/2018   Suicidal ideation 03/19/2018   Protein-calorie malnutrition, severe 12/03/2017   Closed left hip fracture ([place]) 11/30/2017   Diarrhea 11/15/2017   Duodenum disorder 11/15/2017   Frequency of urination 10/21/2017   Cigarette smoker 10/06/2017   Cough 09/25/2017   Changes in vision 09/25/2017   Proteinuria 09/25/2017   Basal cell carcinoma 02/28/2017   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   DOE (dyspnea on exertion) 01/17/2017   Chronic right upper quadrant pain 11/15/2016   Balance  problem 11/15/2016   Simple renal cyst 10/21/2016   Prostate nodule 10/16/2016   Dysuria 10/16/2016   CKD (chronic kidney disease) stage 3, GFR 30-59 ml/min (HCC) 08/05/2016   Anemia 08/05/2016   Anxiety and depression 08/05/2016   Weight loss 08/05/2016   Sleep disturbance 07/23/2016   Hypertension    Osteoarthritis    Tobacco abuse    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    Prior to Admission medications   Medication Sig Start Date End Date Taking? Authorizing Provider  amLODipine (NORVASC) 10 MG tablet Take 1 tablet (10 mg total) by mouth daily. 09/24/17  Yes Leone Haven, MD  calcium-vitamin D (OSCAL WITH D) 500-200 MG-UNIT tablet Take 1 tablet by mouth 2 (two) times daily. 12/03/17  Yes Fritzi Mandes, MD  carvedilol (COREG) 6.25 MG tablet Take 2 tablets (12.5 mg total) by mouth 2 (two) times daily with a meal. 12/03/17  Yes Posey Pronto,  Sona, MD  [employer] VITAMIN B12 1000 MCG tablet TAKE 1 TABLET BY MOUTH EVERY DAY 11/19/17  Yes Leone Haven, MD  diclofenac sodium (VOLTAREN) 1 % GEL Apply 2 g topically 4 (four) times daily as needed (left hip pain). 03/26/18  Yes Leone Haven, MD  ferrous sulfate 325 (65 FE) MG tablet Take 1 tablet (325 mg total) by mouth daily with breakfast. 04/23/18  Yes Leone Haven, MD  folic acid (FOLVITE) 1 MG tablet TAKE 1 TABLET BY MOUTH EVERY DAY 02/05/17  Yes Leone Haven, MD  gabapentin (NEURONTIN) 100 MG capsule Take 2 capsules (200 mg  total) by mouth 3 (three) times daily. 12/03/17  Yes Fritzi Mandes, MD  Multiple Vitamin (MULTIVITAMIN WITH MINERALS) TABS tablet Take 1 tablet by mouth daily. 12/03/17  Yes Fritzi Mandes, MD  ranitidine (ZANTAC) 150 MG tablet Take 150 mg by mouth 2 (two) times daily.   Yes [provider]  tamsulosin (FLOMAX) 0.4 MG CAPS capsule Take 1 capsule (0.4 mg total) by mouth daily. 10/25/17  Yes Leone Haven, MD  thiamine 100 MG tablet Take 1 tablet (100 mg total) by mouth daily. 12/03/17  Yes Fritzi Mandes, MD  traMADol (ULTRAM) 50 MG tablet Take 1 tablet (50 mg total) by mouth every 8 (eight) hours as needed. 04/27/18  Yes Leone Haven, MD  venlafaxine XR (EFFEXOR-XR) 37.5 MG 24 hr capsule Take 1 tablet by mouth once daily for 7 days starting on 04/01/18, then take 2 tablets by mouth once daily 03/26/18  Yes Leone Haven, MD  vitamin C (VITAMIN C) 250 MG tablet Take 1 tablet (250 mg total) by mouth 2 (two) times daily. 12/03/17  Yes Fritzi Mandes, MD  feeding supplement, ENSURE ENLIVE, (ENSURE ENLIVE) LIQD Take 237 mLs by mouth 2 (two) times daily between meals. 12/03/17   Fritzi Mandes, MD    Allergies Aspirin  Family History  Problem Relation Age of Onset   Prostate cancer Neg Hx    Bladder Cancer Neg Hx    Kidney cancer Neg Hx     Social History Social History   Tobacco Use   Smoking status: Current Every Day Smoker    Packs/day: 1.00    Years: 70.00    Pack years: 70.00    Types: Cigarettes   Smokeless tobacco: Never Used  Substance Use Topics   Alcohol use: No   Drug use: No    Review of Systems  Reports he has pain in his stomach.  He cannot really describe it too well.  No pain in the hips per his chronic pain.  Denies headache.  No chest pain no trouble breathing.   ____________________________________________   PHYSICAL EXAM:  VITAL SIGNS: ED Triage Vitals  Enc Vitals Group     BP 05/02/18 0642 (!) 114/43     Pulse Rate 05/02/18 0709 78      Resp 05/02/18 0642 12     Temp 05/02/18 0642 (!) 91.4 F (33 C)     Temp Source 05/02/18 0642 Rectal     SpO2 05/02/18 0642 94 %     Weight 05/02/18 0644 119 lb 0.8 oz (54 kg)     Height --      Head Circumference --      Peak Flow --      Pain Score 05/02/18 0644 4     Pain Loc --      Pain Edu? --      Excl. in [place]? --  Constitutional: Alert and oriented to self but not to situation, he is often asking for something to drink.  He appears fatigued but in no obvious distress. Eyes: Conjunctivae are normal. Head: Atraumatic. Nose: No congestion/rhinnorhea. Mouth/Throat: Mucous membranes are very dry. Neck: No stridor.  Cardiovascular: Normal rate, regular rhythm. Grossly normal heart sounds.  Good peripheral circulation. Respiratory: Normal respiratory effort.  No retractions. Lungs CTAB. Gastrointestinal: Soft and modestly tender throughout but no obvious peritonitis, however of note I feel my exam is not very reliable given the patient's level of confusion.  No distention.  He is guaiac positive, dark stool with some slight obvious red blood Musculoskeletal: No lower extremity tenderness nor edema. Neurologic:  Normal speech and language. No gross focal neurologic deficits are appreciated.  Skin:  Skin is warm, dry and intact. No rash noted. Psychiatric: Mood and affect are slightly confused but also frequently anxious repetitively asking to drink water  Please note much of my exam above is not highly reliable due to the patient's confusion ____________________________________________   LABS (all labs ordered are listed, but only abnormal results are displayed)  Labs Reviewed  COMPREHENSIVE METABOLIC PANEL - Abnormal; Notable for the following components:      Result Value   CO2 13 (*)    Glucose, Bld 195 (*)    BUN 81 (*)    Creatinine, Ser 3.25 (*)    Calcium 8.7 (*)    Total Protein 6.1 (*)    Albumin 2.7 (*)    Alkaline Phosphatase 130 (*)    GFR calc non Af Amer  17 (*)    GFR calc Af Amer 19 (*)    Anion gap 24 (*)    All other components within normal limits  LACTIC ACID, PLASMA - Abnormal; Notable for the following components:   Lactic Acid, Venous >11.0 (*)    All other components within normal limits  CBC WITH DIFFERENTIAL/PLATELET - Abnormal; Notable for the following components:   WBC 21.4 (*)    RBC 1.09 (*)    Hemoglobin 3.2 (*)    HCT 10.7 (*)    MCHC 29.9 (*)    RDW 17.6 (*)    Platelets 514 (*)    nRBC 2.2 (*)    Neutro Abs 19.8 (*)    Lymphs Abs 0.3 (*)    Abs Immature Granulocytes 0.42 (*)    All other components within normal limits  URINALYSIS, COMPLETE (UACMP) WITH MICROSCOPIC - Abnormal; Notable for the following components:   Color, Urine YELLOW (*)    APPearance HAZY (*)    All other components within normal limits  TROPONIN I - Abnormal; Notable for the following components:   Troponin I 0.07 (*)    All other components within normal limits  SARS CORONAVIRUS 2 (HOSPITAL ORDER, [facility])  CULTURE, BLOOD (ROUTINE X 2)  CULTURE, BLOOD (ROUTINE X 2)  URINE CULTURE  C DIFFICILE QUICK SCREEN W PCR REFLEX  GASTROINTESTINAL PANEL BY PCR, STOOL (REPLACES STOOL CULTURE)  PROTIME-INR  CK  LACTIC ACID, PLASMA  CBC  LACTIC ACID, PLASMA  TYPE AND SCREEN  PREPARE RBC (CROSSMATCH)   ____________________________________________  EKG  Reviewed entered by me at 7:05 AM Heart rate 80 QRS 130 QTc 520 Normal sinus rhythm, no frank ischemic changes. ____________________________________________  RADIOLOGY  Ct Abdomen Pelvis Wo Contrast  Result Date: 05/02/2018 CLINICAL DATA:  84 year old male found down covered in his own feces. EXAM: CT ABDOMEN AND PELVIS WITHOUT CONTRAST TECHNIQUE:  Multidetector CT imaging of the abdomen and pelvis was performed following the standard protocol without IV contrast. COMPARISON:  CT the abdomen and pelvis 11/30/2017. FINDINGS: Lower chest: Atherosclerotic  calcifications in the thoracic aorta as well as the right coronary artery. Diffuse low-attenuation throughout the intravascular compartment, suggestive of anemia. Hepatobiliary: No definite suspicious cystic or solid hepatic lesions are confidently identified on today's noncontrast CT examination. Unenhanced appearance of the gallbladder is unremarkable. Pancreas: No definite pancreatic mass or peripancreatic fluid or inflammatory changes identified on today's noncontrast CT examination. Spleen: Unremarkable. Adrenals/Urinary Tract: Mild bilateral renal atrophy. Low-attenuation lesions in the left kidney, incompletely characterized on today's non-contrast CT examination, but likely to represent cysts, measuring up to 2.5 cm in the upper pole. Bilateral adrenal glands are normal in appearance. No hydroureteronephrosis. Urinary bladder is normal in appearance. Stomach/Bowel: Normal appearance of the stomach. No pathologic dilatation of small bowel or colon. Rectal temperature probe in place. Normal appendix. Vascular/Lymphatic: Aortic atherosclerosis. No lymphadenopathy noted in the abdomen or pelvis. Reproductive: Prostate gland and seminal vesicles are unremarkable in appearance. Other: No significant volume of ascites.  No pneumoperitoneum. Musculoskeletal: Orthopedic fixation hardware in the left proximal femur incidentally noted. There are no aggressive appearing lytic or blastic lesions noted in the visualized portions of the skeleton. IMPRESSION: 1. No acute findings are noted in the abdomen or pelvis. 2. Aortic atherosclerosis, in addition to at least right coronary artery disease. 3. Low-attenuation throughout the intravascular compartment, suggestive of anemia. 4. Additional incidental findings, as above. Electronically Signed   By: Vinnie Langton M.D.   On: 05/02/2018 08:31   Dg Chest 1 View  Result Date: 05/02/2018 CLINICAL DATA:  Patient hypothermic. Headache. EXAM: CHEST  1 VIEW COMPARISON:   11/30/2017. FINDINGS: Heart is enlarged. Calcified tortuous aorta. Multiple healed rib fractures. No consolidation or edema. Mild interstitial prominence likely related to chronic tobacco abuse. IMPRESSION: Cardiomegaly. No active disease. Electronically Signed   By: Staci Righter M.D.   On: 05/02/2018 07:12   Ct Head Wo Contrast  Result Date: 05/02/2018 CLINICAL DATA:  Patient found lying on floor known feces. Patient is hypothermic from head to toe. No obvious trauma. EXAM: CT HEAD WITHOUT CONTRAST TECHNIQUE: Contiguous axial images were obtained from the base of the skull through the vertex without intravenous contrast. COMPARISON:  November 30, 2016 FINDINGS: Brain: No subdural, epidural, or subarachnoid hemorrhage. Brainstem, basal cisterns, and cerebellum are unchanged. Probable small lacunar infarct just to the left of midline in the cerebellum on axial image 9, unchanged since November 2011. Ventricles and sulci are prominent but stable. Chronic white matter changes are again identified. A small lacunar infarct in the posterior limb of the left internal capsule on axial image 19 is slightly more conspicuous in the interval. No acute cortical ischemia or infarct noted. No mass effect or midline shift. Vascular: No hyperdense vessel or unexpected calcification. Skull: Normal. Negative for fracture or focal lesion. Sinuses/Orbits: Mucosal thickening in the right maxillary sinus. Paranasal sinuses otherwise normal. The right mastoid air cells and right middle ear are well aerated. Patient is status post left mastoidectomy with chronically opacified middle ear. Other: None. IMPRESSION: 1. A lacunar infarct in the posterior limb of the left internal capsule is more conspicuous in the interval. The difference could be due to difference in slice selection. No other acute intracranial abnormalities. Electronically Signed   By: Dorise Bullion III M.D   On: 05/02/2018 08:36      CT scans reviewed, discussed  abdominal  CT with Dr. Alice Reichert ____________________________________________   PROCEDURES  Procedure(s) performed: None  Procedures  Critical Care performed: Yes, see critical care note(s)  CRITICAL CARE Performed by: Delman Kitten   Total critical care time: 65 minutes  Critical care time was exclusive of separately billable procedures and treating other patients.  Critical care was necessary to treat or prevent imminent or life-threatening deterioration.  Critical care was time spent personally by me on the following activities: development of treatment plan with patient and/or surrogate as well as nursing, discussions with consultants, evaluation of patient's response to treatment, examination of patient, obtaining history from patient or surrogate, ordering and performing treatments and interventions, ordering and review of laboratory studies, ordering and review of radiographic studies, pulse oximetry and re-evaluation of patient's condition.  Justin Andrews was evaluated in Emergency Department on 05/02/2018 for the symptoms described in the history of present illness. He was evaluated in the context of the global COVID-19 pandemic, which necessitated consideration that the patient might be at risk for infection with the SARS-CoV-2 virus that causes COVID-19. Institutional protocols and algorithms that pertain to the evaluation of patients at risk for COVID-19 are in a state of rapid change based on information released by regulatory bodies including the CDC and federal and state organizations. These policies and algorithms were followed during the patient's care in the ED.  Patient required extensive critical care time due to his significant critical illness including major blood loss anemia, hypothermia, sepsis, need for rewarming, broad-spectrum antibiotics, consultations, admission to the hospital and reassessments.   ----------------------------------------- 10:11 AM on  05/02/2018 -----------------------------------------  After 2 units of blood, repeat CBC reveals of hemoglobin still less than 7.  Additional 2 units of blood has been ordered.  I have called the patient's son to update him, left a voicemail at this time regarding patient's status.  The patient is improving being admitted but ordering additional 2 units of blood.  Ongoing critical care time secondary to the need for added transfusions and extended care due to severe acute blood loss   Additional CRITICAL CARE Performed by: Delman Kitten   Total critical care time: 25 minutes  Critical care time was exclusive of separately billable procedures and treating other patients.  Critical care was necessary to treat or prevent imminent or life-threatening deterioration.  Critical care was time spent personally by me on the following activities: development of treatment plan with patient and/or surrogate as well as nursing, discussions with consultants, evaluation of patient's response to treatment, examination of patient, obtaining history from patient or surrogate, ordering and performing treatments and interventions, ordering and review of laboratory studies, ordering and review of radiographic studies, pulse oximetry and re-evaluation of patient's condition.   ____________________________________________   INITIAL IMPRESSION / ASSESSMENT AND PLAN / ED COURSE  Pertinent labs & imaging results that were available during my care of the patient were reviewed by me and considered in my medical decision making (see chart for details).   Patient presents, appears critically ill with hypo-thermia, found down status and GI bleeding.  I am concerned this could represent acute GI bleed, rhabdomyolysis, sepsis, or combination thereof.  Patient also has a history of recent diarrhea and his primary care note from several days ago suggested that they wanted him evaluated the ER but he did not present.  He is having  abdominal pain somewhat diffuse without obvious peritonitis, we will obtain a rapid noncontrast CT to evaluate for perforation or intra-abdominal catastrophe, gross abnormality etc. given  his unstable status.  Also perform a head CT but I find no evidence of an acute neurologic deficit, but do wish to exclude other causes of confusion.  Clinical Course as of May 02 915  Sat May 02, 2018  0727 Arrived to shift at 7 AM, notified of critically ill patient by nursing staff at 705.  I went and evaluated patient immediately thereafter   [MQ]  0844 Patient reassessed.  Blood pressure and alertness are improving.  Does remain confused, pain appears to be improving.  CTs of the head and abdomen thus far have not revealed an acute process, at this point I am suspecting acute GI bleeding this potential cause also consideration for infectious etiology still highly suspected and thus covering with intra-abdominal Flagyl in case of C. difficile and also Zosyn   [MQ]  0849 Dr. Alice Reichert advises transfuse as appropriate, IV protonix. Dr. Ricky Stabs GI team will see in consult today.    [MQ]    Clinical Course User Index [MQ] Delman Kitten, MD    Per patient's son Kirwan), he has had a discussion with his father prior and he wished to be DO NOT RESUSCITATE.  The patient himself does not have capability to make decisions around his care, but after talking with the patient's niece and also with the patient's son Reitter who is his only child and he is not with a living spouse they identify Caryl Asp is closest to skin.  He does not have a defined healthcare power of attorney.  Patient's son reports that his father would not want to have CPR or be on a ventilator, he has had this discussion with his son once prior.  Patient's son asked that the patient be made DO NOT RESUSCITATE.  The patient is however okay and verbally cleared to receive blood product antibiotics and other life-saving measures but not be placed on a ventilator or have  CPR performed.  If the patient is at his hour of death / without a heart beat it is that he is not to be resuscitated.  ----------------------------------------- 8:09 AM on 05/02/2018 -----------------------------------------  Emergency blood product ready for administration, nursing to start blood product administration stat  ----------------------------------------- 9:17 AM on 05/02/2018 -----------------------------------------  Patient status improving.  Blood pressure improving.  Alertness improving, his pain level has improved.  Still quite hypothermic but now normotensive.  We will send repeat CBC and lactic acid.  Admitting to hospitalist service, GI consulting  Continuing to treat for acute blood loss anemia, but also consider possible sepsis of potentially intra-abdominal source with broad-spectrum Zosyn and Flagyl given.  The patient was not resuscitated with 30 mL's per kilogram of crystalloid because he did receive 2 units of blood product in addition to IV fluid with improvement in hemodynamics.  I did not want to overexpand the patient with fluids plus blood product in the immediate ____________________________________________   FINAL CLINICAL IMPRESSION(S) / ED DIAGNOSES  Final diagnoses:  Acute gastrointestinal bleeding  Hypothermia, initial encounter  Shock ([place])  SIRS (systemic inflammatory response syndrome) ([place])        Note:  This document was prepared using Dragon voice recognition software and may include unintentional dictation errors       Delman Kitten, MD 05/02/18 3570    Delman Kitten, MD 05/02/18 1012

## 2018-05-02 NOTE — H&P (Signed)
[facility] NAME: Justin Andrews    MR#:  [id number]  DATE OF BIRTH:  26-Jul-1934  DATE OF ADMISSION:  05/02/2018  PRIMARY CARE PHYSICIAN: Leone Haven, MD   REQUESTING/REFERRING PHYSICIAN: Quale  CHIEF COMPLAINT:   Chief Complaint  Patient presents with  . Fall  . Cold Exposure    HISTORY OF PRESENT ILLNESS: Justin Andrews  is a 84 y.o. male with a known history of C. difficile diarrhea, chronic kidney disease, hypertension, alcohol abuse, smoking-at hip fracture and chronic pain syndrome, he had home health services providing help at home.  The visiting nurse had noted some dark stool last week and advised him to go to emergency room.  After a few more dark bowel movement EMS was called by family but patient refused to go to emergency room.  Finally today he was found on the floor not very well responsive in the pool of his stool with some blood by neighbors and called EMS He was noted to be hypothermic, hypotensive on arrival and tachypneic.  His hemoglobin was 3 on arrival to ER.  ER physician gave 2 units of blood transfusion and IV fluid bolus which helped to stabilize his vitals.  Also started on warm air blanket, and given to hospitalist team for admission and further management. Patient was confused and cannot give much details.  This history was obtained from son and ER physician.  PAST MEDICAL HISTORY:   Past Medical History:  Diagnosis Date  . C. difficile diarrhea 11/2017  . Elevated serum creatinine   . History of melanoma   . Hypertension   . Osteoarthritis   . Tobacco abuse     PAST SURGICAL HISTORY:  Past Surgical History:  Procedure Laterality Date  . ANKLE FRACTURE SURGERY    . EXTERNAL EAR SURGERY    . INTRAMEDULLARY (IM) NAIL INTERTROCHANTERIC Left 12/01/2017   Procedure: INTRAMEDULLARY (IM) NAIL INTERTROCHANTRIC;  Surgeon: Thornton Park, MD;  Location: [facility] ORS;  Service: Orthopedics;  Laterality: Left;   . ROTATOR CUFF REPAIR    . TONSILLECTOMY    . TYMPANOPLASTY W/ MASTOIDECTOMY     Patient with reports of mastoid surgery (appears to have had surgery on TM; Left).    SOCIAL HISTORY:  Social History   Tobacco Use  . Smoking status: Current Every Day Smoker    Packs/day: 1.00    Years: 70.00    Pack years: 70.00    Types: Cigarettes  . Smokeless tobacco: Never Used  Substance Use Topics  . Alcohol use: No    FAMILY HISTORY:  Family History  Problem Relation Age of Onset  . Prostate cancer Neg Hx   . Bladder Cancer Neg Hx   . Kidney cancer Neg Hx     DRUG ALLERGIES:  Allergies  Allergen Reactions  . Aspirin Other (See Comments)    bleeding    REVIEW OF SYSTEMS:   Due to confusion patient cannot give reliable review of system.  MEDICATIONS AT HOME:  Prior to Admission medications   Medication Sig Start Date End Date Taking? Authorizing Provider  amLODipine (NORVASC) 10 MG tablet Take 1 tablet (10 mg total) by mouth daily. 09/24/17  Yes Leone Haven, MD  calcium-vitamin D (OSCAL WITH D) 500-200 MG-UNIT tablet Take 1 tablet by mouth 2 (two) times daily. 12/03/17  Yes Fritzi Mandes, MD  carvedilol (COREG) 6.25 MG tablet Take 2 tablets (12.5 mg total) by mouth 2 (two) times daily with  a meal. 12/03/17  Yes Fritzi Mandes, MD  [employer] VITAMIN B12 1000 MCG tablet TAKE 1 TABLET BY MOUTH EVERY DAY 11/19/17  Yes Leone Haven, MD  diclofenac sodium (VOLTAREN) 1 % GEL Apply 2 g topically 4 (four) times daily as needed (left hip pain). 03/26/18  Yes Leone Haven, MD  ferrous sulfate 325 (65 FE) MG tablet Take 1 tablet (325 mg total) by mouth daily with breakfast. 04/23/18  Yes Leone Haven, MD  folic acid (FOLVITE) 1 MG tablet TAKE 1 TABLET BY MOUTH EVERY DAY 02/05/17  Yes Leone Haven, MD  gabapentin (NEURONTIN) 100 MG capsule Take 2 capsules (200 mg total) by mouth 3 (three) times daily. 12/03/17  Yes Fritzi Mandes, MD  Multiple Vitamin (MULTIVITAMIN WITH  MINERALS) TABS tablet Take 1 tablet by mouth daily. 12/03/17  Yes Fritzi Mandes, MD  ranitidine (ZANTAC) 150 MG tablet Take 150 mg by mouth 2 (two) times daily.   Yes [provider]  tamsulosin (FLOMAX) 0.4 MG CAPS capsule Take 1 capsule (0.4 mg total) by mouth daily. 10/25/17  Yes Leone Haven, MD  thiamine 100 MG tablet Take 1 tablet (100 mg total) by mouth daily. 12/03/17  Yes Fritzi Mandes, MD  traMADol (ULTRAM) 50 MG tablet Take 1 tablet (50 mg total) by mouth every 8 (eight) hours as needed. 04/27/18  Yes Leone Haven, MD  venlafaxine XR (EFFEXOR-XR) 37.5 MG 24 hr capsule Take 1 tablet by mouth once daily for 7 days starting on 04/01/18, then take 2 tablets by mouth once daily 03/26/18  Yes Leone Haven, MD  vitamin C (VITAMIN C) 250 MG tablet Take 1 tablet (250 mg total) by mouth 2 (two) times daily. 12/03/17  Yes Fritzi Mandes, MD  feeding supplement, ENSURE ENLIVE, (ENSURE ENLIVE) LIQD Take 237 mLs by mouth 2 (two) times daily between meals. 12/03/17   Fritzi Mandes, MD      PHYSICAL EXAMINATION:   VITAL SIGNS: Blood pressure (!) 134/58, pulse 97, temperature (!) 96.3 F (35.7 C), temperature source Axillary, resp. rate 20, height 5\' 5"  (1.651 m), weight 56.1 kg, SpO2 96 %.  GENERAL:  84 y.o.-year-old patient lying in the bed with critical appearance. EYES: Pupils equal, round, reactive to light and accommodation. No scleral icterus. Extraocular muscles intact.  Conjunctive are pale. HEENT: Head atraumatic, normocephalic. Oropharynx and nasopharynx clear.  NECK:  Supple, no jugular venous distention. No thyroid enlargement, no tenderness.  LUNGS: Normal breath sounds bilaterally, no wheezing, rales,rhonchi or crepitation. No use of accessory muscles of respiration.  CARDIOVASCULAR: S1, S2 normal. No murmurs, rubs, or gallops.  ABDOMEN: Soft, nontender, nondistended. Bowel sounds present. No organomegaly or mass.  Bleeding per rectum present. EXTREMITIES: No pedal  edema, cyanosis, or clubbing.  NEUROLOGIC: Cranial nerves II through XII are intact. Muscle strength 3-4/5 in all extremities. Sensation intact. Gait not checked.  PSYCHIATRIC: The patient is alert and oriented x 1.  SKIN: No obvious rash, lesion, or ulcer.   LABORATORY PANEL:   CBC Recent Labs  Lab 05/02/18 0653 05/02/18 0937  WBC 21.4* 22.7*  HGB 3.2* 5.8*  HCT 10.7* 18.4*  PLT 514* 390  MCV 98.2 92.9  MCH 29.4 29.3  MCHC 29.9* 31.5  RDW 17.6* 16.3*  LYMPHSABS 0.3*  --   MONOABS 0.8  --   EOSABS 0.1  --   BASOSABS 0.0  --    ------------------------------------------------------------------------------------------------------------------  Chemistries  Recent Labs  Lab 05/02/18 0653  NA 140  K 4.9  CL 103  CO2 13*  GLUCOSE 195*  BUN 81*  CREATININE 3.25*  CALCIUM 8.7*  AST 31  ALT 16  ALKPHOS 130*  BILITOT 0.6   ------------------------------------------------------------------------------------------------------------------ estimated creatinine clearance is 13.9 mL/min (A) (by C-G formula based on SCr of 3.25 mg/dL (H)). ------------------------------------------------------------------------------------------------------------------ No results for input(s): TSH, T4TOTAL, T3FREE, THYROIDAB in the last 72 hours.  Invalid input(s): FREET3   Coagulation profile Recent Labs  Lab 05/02/18 0653  INR 1.1   ------------------------------------------------------------------------------------------------------------------- No results for input(s): DDIMER in the last 72 hours. -------------------------------------------------------------------------------------------------------------------  Cardiac Enzymes Recent Labs  Lab 05/02/18 0653  TROPONINI 0.07*   ------------------------------------------------------------------------------------------------------------------ Invalid input(s):  POCBNP  ---------------------------------------------------------------------------------------------------------------  Urinalysis    Component Value Date/Time   COLORURINE YELLOW (A) 05/02/2018 0741   APPEARANCEUR HAZY (A) 05/02/2018 0741   APPEARANCEUR Clear 11/13/2016 1013   LABSPEC 1.012 05/02/2018 0741   PHURINE 5.0 05/02/2018 0741   GLUCOSEU NEGATIVE 05/02/2018 0741   HGBUR NEGATIVE 05/02/2018 0741   BILIRUBINUR NEGATIVE 05/02/2018 0741   BILIRUBINUR negative 10/20/2017 1536   BILIRUBINUR Negative 11/13/2016 1013   KETONESUR NEGATIVE 05/02/2018 0741   PROTEINUR NEGATIVE 05/02/2018 0741   UROBILINOGEN 0.2 10/20/2017 1536   NITRITE NEGATIVE 05/02/2018 0741   LEUKOCYTESUR NEGATIVE 05/02/2018 0741     RADIOLOGY: Ct Abdomen Pelvis Wo Contrast  Result Date: 05/02/2018 CLINICAL DATA:  84 year old male found down covered in his own feces. EXAM: CT ABDOMEN AND PELVIS WITHOUT CONTRAST TECHNIQUE: Multidetector CT imaging of the abdomen and pelvis was performed following the standard protocol without IV contrast. COMPARISON:  CT the abdomen and pelvis 11/30/2017. FINDINGS: Lower chest: Atherosclerotic calcifications in the thoracic aorta as well as the right coronary artery. Diffuse low-attenuation throughout the intravascular compartment, suggestive of anemia. Hepatobiliary: No definite suspicious cystic or solid hepatic lesions are confidently identified on today's noncontrast CT examination. Unenhanced appearance of the gallbladder is unremarkable. Pancreas: No definite pancreatic mass or peripancreatic fluid or inflammatory changes identified on today's noncontrast CT examination. Spleen: Unremarkable. Adrenals/Urinary Tract: Mild bilateral renal atrophy. Low-attenuation lesions in the left kidney, incompletely characterized on today's non-contrast CT examination, but likely to represent cysts, measuring up to 2.5 cm in the upper pole. Bilateral adrenal glands are normal in appearance. No  hydroureteronephrosis. Urinary bladder is normal in appearance. Stomach/Bowel: Normal appearance of the stomach. No pathologic dilatation of small bowel or colon. Rectal temperature probe in place. Normal appendix. Vascular/Lymphatic: Aortic atherosclerosis. No lymphadenopathy noted in the abdomen or pelvis. Reproductive: Prostate gland and seminal vesicles are unremarkable in appearance. Other: No significant volume of ascites.  No pneumoperitoneum. Musculoskeletal: Orthopedic fixation hardware in the left proximal femur incidentally noted. There are no aggressive appearing lytic or blastic lesions noted in the visualized portions of the skeleton. IMPRESSION: 1. No acute findings are noted in the abdomen or pelvis. 2. Aortic atherosclerosis, in addition to at least right coronary artery disease. 3. Low-attenuation throughout the intravascular compartment, suggestive of anemia. 4. Additional incidental findings, as above. Electronically Signed   By: Vinnie Langton M.D.   On: 05/02/2018 08:31   Dg Chest 1 View  Result Date: 05/02/2018 CLINICAL DATA:  Patient hypothermic. Headache. EXAM: CHEST  1 VIEW COMPARISON:  11/30/2017. FINDINGS: Heart is enlarged. Calcified tortuous aorta. Multiple healed rib fractures. No consolidation or edema. Mild interstitial prominence likely related to chronic tobacco abuse. IMPRESSION: Cardiomegaly. No active disease. Electronically Signed   By: Staci Righter M.D.   On: 05/02/2018 07:12   Ct Head Wo Contrast  Result Date: 05/02/2018  CLINICAL DATA:  Patient found lying on floor known feces. Patient is hypothermic from head to toe. No obvious trauma. EXAM: CT HEAD WITHOUT CONTRAST TECHNIQUE: Contiguous axial images were obtained from the base of the skull through the vertex without intravenous contrast. COMPARISON:  November 30, 2016 FINDINGS: Brain: No subdural, epidural, or subarachnoid hemorrhage. Brainstem, basal cisterns, and cerebellum are unchanged. Probable small lacunar  infarct just to the left of midline in the cerebellum on axial image 9, unchanged since November 2011. Ventricles and sulci are prominent but stable. Chronic white matter changes are again identified. A small lacunar infarct in the posterior limb of the left internal capsule on axial image 19 is slightly more conspicuous in the interval. No acute cortical ischemia or infarct noted. No mass effect or midline shift. Vascular: No hyperdense vessel or unexpected calcification. Skull: Normal. Negative for fracture or focal lesion. Sinuses/Orbits: Mucosal thickening in the right maxillary sinus. Paranasal sinuses otherwise normal. The right mastoid air cells and right middle ear are well aerated. Patient is status post left mastoidectomy with chronically opacified middle ear. Other: None. IMPRESSION: 1. A lacunar infarct in the posterior limb of the left internal capsule is more conspicuous in the interval. The difference could be due to difference in slice selection. No other acute intracranial abnormalities. Electronically Signed   By: Dorise Bullion III M.D   On: 05/02/2018 08:36    EKG: Orders placed or performed during the hospital encounter of 05/02/18  . EKG 12-Lead  . EKG 12-Lead  . ED EKG 12-Lead  . ED EKG 12-Lead    IMPRESSION AND PLAN:  *Hemorrhagic shock Acute blood loss anemia GI bleed Hypothermia  2 units of blood transfusion given by ER physician, hemoglobin came up to 5.8.  Will give 2 more units as patient is actively bleeding. GI physician is contacted by ER, monitor in stepdown unit. Keep n.p.o. and continue IV fluid. Continue IV PPI twice daily Patient was having complaint of chronic pain since hip fracture and surgery and being managed by primary care physician.  Suspected to have pain medication abuse. Hypothermia is resolved by using warm air blanket. Blood pressure is stable with IV fluid and blood transfusion.  *Acute renal failure on CKD stage III This could be due to  acute blood loss. I am not sure about NSAIDs use. Continue to monitor with IV fluids.  Electrolytes are currently under control but have low bicarb.  *Hypertension We will hold medication with presentation of shock.  *History of alcoholism as per son Continue vitamin supplements.  *Diarrhea Patient had history of C. difficile in recent past. WBC count is high currently. Keep on contact isolation and check stool for C. difficile.  All the records are reviewed and case discussed with ED provider. Management plans discussed with the patient, family and they are in agreement.  CODE STATUS: DNR.    Code Status Orders  (From admission, onward)         Start     Ordered   05/02/18 1428  Do not attempt resuscitation (DNR)  Continuous    Question Answer Comment  In the event of cardiac or respiratory ARREST Do not call a "code blue"   In the event of cardiac or respiratory ARREST Do not perform Intubation, CPR, defibrillation or ACLS   In the event of cardiac or respiratory ARREST Use medication by any route, position, wound care, and other measures to relive pain and suffering. May use oxygen, suction and manual treatment of  airway obstruction as needed for comfort.   Comments per patient's only child the patient is to be DNR per his father's prior expressed wishes. Still ok to receive blood, antibiotics, and other care but NO intubation and NO CPR      05/02/18 1427        Code Status History    Date Active Date Inactive Code Status Order ID Comments User Context   05/02/2018 0804 05/02/2018 1427 DNR [id number]  Delman Kitten, MD ED   03/13/2018 1929 03/14/2018 2030 Full Code [id number]  Nena Polio, MD ED   11/30/2017 1846 12/01/2017 2040 Full Code [id number]  Thornton Park, MD Inpatient   11/30/2017 1601 11/30/2017 1846 Full Code [id number]  Demetrios Loll, MD Inpatient       TOTAL TIME TAKING CARE OF THIS PATIENT: 50 minutes.    Vaughan Basta M.D on 05/02/2018   Between  7am to 6pm - Pager - [phone]  After 6pm go to [email or web address] - password [email or web address] [facility]  Office  [phone]  CC: Primary care physician; Leone Haven, MD   Note: This dictation was prepared with Dragon dictation along with smaller phrase technology. Any transcriptional errors that result from this process are unintentional.

## 2018-05-02 NOTE — ED Notes (Signed)
Pt VERY hard of hearing. Better ear is right ear.

## 2018-05-02 NOTE — H&P (View-Only) (Signed)
[facility] GI Inpatient Consult Note   Justin [facility], M.D.  Reason for Consult: GI bleeding, anemia   Attending Requesting Consult: Justin [facility], M.D.   History of Present Illness: Justin [facility] is a 84 y.o. male male with a history of chronic pain syndrome after left intertrochanteric femur fracture.  He has a history of reported alcoholism as well by the son, Justin [facility].  Patient was admitted after being found home in an unconscious state in a pool of feces.  He reportedly has had 3 bloody bowel movements today after arriving in the intensive care unit although he appears to be alert, somewhat agitated but stable hemodynamically.  He is unaware of having a previous upper endoscopy or colonoscopy evaluation.  Although he is alert to person and place, he is unaware of current circumstances the place him in the hospital.  He is a poor historian.  Admitting hemoglobin was 3. Over the phone, I spoke with his son Justin Andrews, who told me that the patient has regular home health care and that a home health nurse visited the patient's residence on or around Wednesday this week (3 days ago).  At that time the nurse reported melenic stool to [facility] and the patient was encouraged to go to the hospital.  After several more bloody bowel movements, the patient had EMS called but ultimately refused to go to the hospital.  The patient was found down few days later by neighbors and that is the current history of present illness. 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  Past Medical History:  Past Medical History:  Diagnosis Date  . C. difficile diarrhea 11/2017  . Elevated serum creatinine   . History of melanoma   . Hypertension   . Osteoarthritis   . Tobacco abuse     Problem List: Patient Active Problem List   Diagnosis Date Noted  . GI bleed 05/02/2018  . Hemorrhagic shock ([place]) 05/02/2018  . Altered mental status 05/02/2018   . Anemia due to blood loss, acute 05/02/2018  . RLS (restless legs syndrome) 04/22/2018  . Wrist pain 04/22/2018  . Right hip pain 04/08/2018  . Otitis media 03/19/2018  . Suicidal ideation 03/19/2018  . Protein-calorie malnutrition, severe 12/03/2017  . Closed left hip fracture ([place]) 11/30/2017  . Diarrhea 11/15/2017  . Duodenum disorder 11/15/2017  . Frequency of urination 10/21/2017  . Cigarette smoker 10/06/2017  . Cough 09/25/2017  . Changes in vision 09/25/2017  . Proteinuria 09/25/2017  . Basal cell carcinoma 02/28/2017  . 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 DOE (dyspnea on exertion) 01/17/2017  . Chronic right upper quadrant pain 11/15/2016  . Balance problem 11/15/2016  . Simple renal cyst 10/21/2016  . Prostate nodule 10/16/2016  . Dysuria 10/16/2016  . CKD (chronic kidney disease) stage 3, GFR 30-59 ml/min (HCC) 08/05/2016  . Anemia 08/05/2016  . Anxiety and depression 08/05/2016  . Weight loss 08/05/2016  . Sleep disturbance 07/23/2016  . Hypertension   . Osteoarthritis   . Tobacco abuse   . 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[place]) 03/06/2016    Past Surgical History: Past Surgical History:  Procedure Laterality Date  . ANKLE FRACTURE SURGERY    . EXTERNAL EAR SURGERY    . INTRAMEDULLARY (IM) NAIL INTERTROCHANTERIC Left 12/01/2017   Procedure: INTRAMEDULLARY (IM) NAIL INTERTROCHANTRIC;  Surgeon: Thornton Park, MD;  Location: [facility] ORS;  Service: Orthopedics;  Laterality: Left;  . ROTATOR  CUFF REPAIR    . TONSILLECTOMY    . TYMPANOPLASTY W/ MASTOIDECTOMY     Patient with reports of mastoid surgery (appears to have had surgery on TM; Left).    Allergies: Allergies  Allergen Reactions  . Aspirin Other (See Comments)    bleeding    Home Medications: Medications Prior to Admission  Medication Sig Dispense Refill Last Dose  . amLODipine (NORVASC) 10 MG tablet Take 1 tablet (10 mg total) by mouth daily. 90 tablet 3 Unknown at Unknown  .  calcium-vitamin D (OSCAL WITH D) 500-200 MG-UNIT tablet Take 1 tablet by mouth 2 (two) times daily. 60 tablet 1 Unknown at Unknown  . carvedilol (COREG) 6.25 MG tablet Take 2 tablets (12.5 mg total) by mouth 2 (two) times daily with a meal. 60 tablet 5 Unknown at Unknown  . [employer] VITAMIN B12 1000 MCG tablet TAKE 1 TABLET BY MOUTH EVERY DAY 90 tablet 1 Unknown at Unknown  . diclofenac sodium (VOLTAREN) 1 % GEL Apply 2 g topically 4 (four) times daily as needed (left hip pain). 100 g 0 prn at prn  . ferrous sulfate 325 (65 FE) MG tablet Take 1 tablet (325 mg total) by mouth daily with breakfast. 90 tablet 1 Unknown at Unknown  . folic acid (FOLVITE) 1 MG tablet TAKE 1 TABLET BY MOUTH EVERY DAY 90 tablet 1 Unknown at Unknown  . gabapentin (NEURONTIN) 100 MG capsule Take 2 capsules (200 mg total) by mouth 3 (three) times daily. 20 capsule 0 Unknown at Unknown  . Multiple Vitamin (MULTIVITAMIN WITH MINERALS) TABS tablet Take 1 tablet by mouth daily. 30 tablet 0 Unknown at Unknown  . ranitidine (ZANTAC) 150 MG tablet Take 150 mg by mouth 2 (two) times daily.   Unknown at Unknown  . tamsulosin (FLOMAX) 0.4 MG CAPS capsule Take 1 capsule (0.4 mg total) by mouth daily. 30 capsule 3 Unknown at Unknown  . thiamine 100 MG tablet Take 1 tablet (100 mg total) by mouth daily. 30 tablet 0 Unknown at Unknown  . traMADol (ULTRAM) 50 MG tablet Take 1 tablet (50 mg total) by mouth every 8 (eight) hours as needed. 10 tablet 0 prn at prn  . venlafaxine XR (EFFEXOR-XR) 37.5 MG 24 hr capsule Take 1 tablet by mouth once daily for 7 days starting on 04/01/18, then take 2 tablets by mouth once daily 60 capsule 1 Unknown at Unknown  . vitamin C (VITAMIN C) 250 MG tablet Take 1 tablet (250 mg total) by mouth 2 (two) times daily. 30 tablet 0 Unknown at Unknown  . feeding supplement, ENSURE ENLIVE, (ENSURE ENLIVE) LIQD Take 237 mLs by mouth 2 (two) times daily between meals. 237 mL 12 Taking   Home medication reconciliation was  completed with the patient.   Scheduled Inpatient Medications:   . pantoprazole (PROTONIX) IV  40 mg Intravenous Q12H    Continuous Inpatient Infusions:   . sodium chloride    . sodium chloride    . piperacillin-tazobactam (ZOSYN)  IV      PRN Inpatient Medications:    Family History: family history is not on file.   GI Family History: Negative.  Social History:   reports that he has been smoking cigarettes. He has a 70.00 pack-year smoking history. He has never used smokeless tobacco. He reports that he does not drink alcohol or use drugs. The patient denies ETOH, tobacco, or drug use.    Review of Systems: Review of Systems - Negative except That in the history  of present illness  Physical Examination: BP (!) 153/64   Pulse (!) 102   Temp 97.7 F (36.5 C) (Oral)   Resp (!) 25   Ht 5\' 5"  (1.651 m)   Wt 56.1 kg   SpO2 99%   BMI 20.58 kg/m  Physical Exam Constitutional:      General: He is not in acute distress.    Appearance: He is ill-appearing. He is not diaphoretic.  HENT:     Head: Normocephalic and atraumatic.     Ears:     Comments: Evidence of left ear surgery noted around the pinna    Mouth/Throat:     Mouth: Mucous membranes are dry.  Eyes:     Extraocular Movements: Extraocular movements intact.     Conjunctiva/sclera: Conjunctivae normal.     Pupils: Pupils are equal, round, and reactive to light.  Neck:     Musculoskeletal: Normal range of motion.  Cardiovascular:     Rate and Rhythm: Normal rate.     Pulses: Normal pulses.     Heart sounds: No gallop.   Pulmonary:     Effort: No respiratory distress.     Breath sounds: No wheezing.  Abdominal:     General: Abdomen is flat. There is distension.     Palpations: Abdomen is soft.     Tenderness: There is abdominal tenderness. There is no guarding or rebound.  Musculoskeletal: Normal range of motion.  Skin:    General: Skin is warm and dry.  Neurological:     Mental Status: He is alert.      Comments: Poor historian. Poor memory to recent and remote medical events.   Psychiatric:     Comments: Patient is slightly agitated but agreeable. Has some disordered though processes and is generally irritable. No suicidal ideation.     Data: Lab Results  Component Value Date   WBC 22.7 (H) 05/02/2018   HGB 5.8 (L) 05/02/2018   HCT 18.4 (L) 05/02/2018   MCV 92.9 05/02/2018   PLT 390 05/02/2018   Recent Labs  Lab 05/02/18 0653 05/02/18 0937  HGB 3.2* 5.8*   Lab Results  Component Value Date   NA 140 05/02/2018   K 4.9 05/02/2018   CL 103 05/02/2018   CO2 13 (L) 05/02/2018   BUN 81 (H) 05/02/2018   CREATININE 3.25 (H) 05/02/2018   Lab Results  Component Value Date   ALT 16 05/02/2018   AST 31 05/02/2018   ALKPHOS 130 (H) 05/02/2018   BILITOT 0.6 05/02/2018   Recent Labs  Lab 05/02/18 0653  INR 1.1   CBC Latest Ref Rng & Units 05/02/2018 05/02/2018 03/13/2018  WBC 4.0 - 10.5 K/uL 22.7(H) 21.4(H) 10.9(H)  Hemoglobin 13.0 - 17.0 g/dL 5.8(L) 3.2(LL) 12.3(L)  Hematocrit 39.0 - 52.0 % 18.4(L) 10.7(LL) 37.6(L)  Platelets 150 - 400 K/uL 390 514(H) 343    STUDIES: Ct Abdomen Pelvis Wo Contrast  Result Date: 05/02/2018 CLINICAL DATA:  84 year old male found down covered in his own feces. EXAM: CT ABDOMEN AND PELVIS WITHOUT CONTRAST TECHNIQUE: Multidetector CT imaging of the abdomen and pelvis was performed following the standard protocol without IV contrast. COMPARISON:  CT the abdomen and pelvis 11/30/2017. FINDINGS: Lower chest: Atherosclerotic calcifications in the thoracic aorta as well as the right coronary artery. Diffuse low-attenuation throughout the intravascular compartment, suggestive of anemia. Hepatobiliary: No definite suspicious cystic or solid hepatic lesions are confidently identified on today's noncontrast CT examination. Unenhanced appearance of the gallbladder is unremarkable. Pancreas: No  definite pancreatic mass or peripancreatic fluid or inflammatory  changes identified on today's noncontrast CT examination. Spleen: Unremarkable. Adrenals/Urinary Tract: Mild bilateral renal atrophy. Low-attenuation lesions in the left kidney, incompletely characterized on today's non-contrast CT examination, but likely to represent cysts, measuring up to 2.5 cm in the upper pole. Bilateral adrenal glands are normal in appearance. No hydroureteronephrosis. Urinary bladder is normal in appearance. Stomach/Bowel: Normal appearance of the stomach. No pathologic dilatation of small bowel or colon. Rectal temperature probe in place. Normal appendix. Vascular/Lymphatic: Aortic atherosclerosis. No lymphadenopathy noted in the abdomen or pelvis. Reproductive: Prostate gland and seminal vesicles are unremarkable in appearance. Other: No significant volume of ascites.  No pneumoperitoneum. Musculoskeletal: Orthopedic fixation hardware in the left proximal femur incidentally noted. There are no aggressive appearing lytic or blastic lesions noted in the visualized portions of the skeleton. IMPRESSION: 1. No acute findings are noted in the abdomen or pelvis. 2. Aortic atherosclerosis, in addition to at least right coronary artery disease. 3. Low-attenuation throughout the intravascular compartment, suggestive of anemia. 4. Additional incidental findings, as above. Electronically Signed   By: Vinnie Langton M.D.   On: 05/02/2018 08:31   Dg Chest 1 View  Result Date: 05/02/2018 CLINICAL DATA:  Patient hypothermic. Headache. EXAM: CHEST  1 VIEW COMPARISON:  11/30/2017. FINDINGS: Heart is enlarged. Calcified tortuous aorta. Multiple healed rib fractures. No consolidation or edema. Mild interstitial prominence likely related to chronic tobacco abuse. IMPRESSION: Cardiomegaly. No active disease. Electronically Signed   By: Staci Righter M.D.   On: 05/02/2018 07:12   Ct Head Wo Contrast  Result Date: 05/02/2018 CLINICAL DATA:  Patient found lying on floor known feces. Patient is hypothermic  from head to toe. No obvious trauma. EXAM: CT HEAD WITHOUT CONTRAST TECHNIQUE: Contiguous axial images were obtained from the base of the skull through the vertex without intravenous contrast. COMPARISON:  November 30, 2016 FINDINGS: Brain: No subdural, epidural, or subarachnoid hemorrhage. Brainstem, basal cisterns, and cerebellum are unchanged. Probable small lacunar infarct just to the left of midline in the cerebellum on axial image 9, unchanged since November 2011. Ventricles and sulci are prominent but stable. Chronic white matter changes are again identified. A small lacunar infarct in the posterior limb of the left internal capsule on axial image 19 is slightly more conspicuous in the interval. No acute cortical ischemia or infarct noted. No mass effect or midline shift. Vascular: No hyperdense vessel or unexpected calcification. Skull: Normal. Negative for fracture or focal lesion. Sinuses/Orbits: Mucosal thickening in the right maxillary sinus. Paranasal sinuses otherwise normal. The right mastoid air cells and right middle ear are well aerated. Patient is status post left mastoidectomy with chronically opacified middle ear. Other: None. IMPRESSION: 1. A lacunar infarct in the posterior limb of the left internal capsule is more conspicuous in the interval. The difference could be due to difference in slice selection. No other acute intracranial abnormalities. Electronically Signed   By: Dorise Bullion III M.D   On: 05/02/2018 08:36   @IMAGES @  Assessment: 1. GI bleeding - Appears by history to have been ongoing for at least the last 3 days with patient refusing hospitalization early during the bleeding course. UGI vs LGI source. Possible PUD (duodenal most likely), mallory weiss tear, mesenteric ischemia, ischemic colitis, diverticular bleed, colon or UGI malignancy.  Early on in the bleed, it appeared to be upper GI, currently has red clots per rectum on the bed sheet suggesting a LGI source. Just  during the writing of this  note, the patient vomited brownish reddish material without clots or hematin.   2. Anemia secondary to GI hemorrhage.  3. Clostridium Dificile Colitis - Treatment per Dr. Marthann Schiller.  4. Chronic pain syndrome with family reportng history of narcotic abuse.  5. History of alcohol abuse.   6. DNR status - Patient and son still would like full medical care, procedures and evaluation short of CPR and ventilator support.   7. Sepsis with hypotension. Currently BP is stable in the 110's.   Recommendations:  1. Tagged rbc scan today to help localize GI source of bleeding. 2. Consent for EGD and flexible sigmoidoscopy. I discussed with both the patient and his son regarding these procedures. The patient understands the nature of the planned procedure, indications, risks, alternatives and potential complications including but not limited to bleeding, infection, perforation, damage to internal organs and possible oversedation/side effects from anesthesia. The patient and his son, Gaertner, agree and give consent to proceed.  Please refer to procedure notes for findings, recommendations and  disposition/instructions.. Will switch to colonoscopy if evidence of right colonic bleed on bleeding scan. Also may discuss with vascular team if indicated . 3. If trbc scan + endocopic procedures unsuccessful at localizing or treating source of bleed, may require laparotomy by general surgery.  4. IV acid suppression. 5. NPO 6. Following closely.   Thank you for the consult. Please call with questions or concerns.  Olean Ree, "Lanny Hurst MD [facility] [place], [place] [address] [phone]  05/02/2018 1:24 PM

## 2018-05-02 NOTE — Progress Notes (Addendum)
NP pointed out that patient did not receive earlier Vancomycin dose when he went to the bleeding scan. NP asked this RN to administer missed dose. There were no doses in the refrigerator to be administered, so RN called the pharmacy around [phone] and asked a male tech to send. Pharmacy did not send for over an hour and NP asked for Pharmacy to be called again. Pharmacy was called and spoke to another male tech Vicente Males, not the person who this RN spoke to before, and she was told that the medication was scheduled for midnight, but the NP wanted a dose given now, and again at [place], since the earlier dose was missed, and the GI MD wanted it administered. Tech acknowledged this time, but Pharmacy did not send Vancomycin po until NP called and spoke to the pharmacist. Medication was sent and patient received dose as documented in the [facility] in [facility].

## 2018-05-02 NOTE — Consult Note (Signed)
Pharmacy Antibiotic Note  Justin [facility] is a 84 y.o. male admitted on 05/02/2018 with sepsis. Patient was found down, possibly down for  A few days with GIB. IS having abdominal pain in ED, utilizing Zosyn for possible intra-abdominal infection. Questionable history of cdiff. Pharmacy has been consulted for Zosyn dosing.  Plan: Ordered Zosyn 3.375 g EI.  Weight: 119 lb 0.8 oz (54 kg)  Temp (24hrs), Avg:91.3 F (32.9 C), Min:89.6 F (32 C), Max:92.5 F (33.6 C)  Recent Labs  Lab 05/02/18 0653  WBC 21.4*  CREATININE 3.25*  LATICACIDVEN >11.0*    Estimated Creatinine Clearance: 13.4 mL/min (A) (by C-G formula based on SCr of 3.25 mg/dL (H)).    Allergies  Allergen Reactions  . Aspirin Other (See Comments)    bleeding    Antimicrobials this admission: 4/25 Zosyn >>  4/25 Flagyl >>   Dose adjustments this admission: N/A  Microbiology results: 4/25 BCx: pending 4/25 UCx: pending  4/25 GI panel pending 4/25 COVID pending 4/25 Cdiff pending  Thank you for allowing pharmacy to be a part of this patient's care.    Paticia Stack, PharmD Pharmacy Resident  05/02/2018 10:00 AM

## 2018-05-02 NOTE — ED Notes (Signed)
Pt resting now in NAD. Pt vitals coming back to normal. Temp now 94.51F. Pt now oriented to self and place. Pt does not report pain at this time but requests to continue sleeping. Pt color and turgor appear to be improving. Pt mentation improving.

## 2018-05-02 NOTE — Consult Note (Signed)
[facility] GI Inpatient Consult Note   [name], M.D.  Reason for Consult: GI bleeding, anemia   Attending Requesting Consult: Rush Barer, M.D.   History of Present Illness: Justin Andrews is a 84 y.o. male male with a history of chronic pain syndrome after left intertrochanteric femur fracture.  He has a history of reported alcoholism as well by the son, Justin Andrews.  Patient was admitted after being found home in an unconscious state in a pool of feces.  He reportedly has had 3 bloody bowel movements today after arriving in the intensive care unit although he appears to be alert, somewhat agitated but stable hemodynamically.  He is unaware of having a previous upper endoscopy or colonoscopy evaluation.  Although he is alert to person and place, he is unaware of current circumstances the place him in the hospital.  He is a poor historian.  Admitting hemoglobin was 3. Over the phone, I spoke with his son Justin Andrews, who told me that the patient has regular home health care and that a home health nurse visited the patient's residence on or around Wednesday this week (3 days ago).  At that time the nurse reported melenic stool to [facility] and the patient was encouraged to go to the hospital.  After several more bloody bowel movements, the patient had EMS called but ultimately refused to go to the hospital.  The patient was found down few days later by neighbors and that is the current history of present illness. 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  Past Medical History:  Past Medical History:  Diagnosis Date  . C. difficile diarrhea 11/2017  . Elevated serum creatinine   . History of melanoma   . Hypertension   . Osteoarthritis   . Tobacco abuse     Problem List: Patient Active Problem List   Diagnosis Date Noted  . GI bleed 05/02/2018  . Hemorrhagic shock ([place]) 05/02/2018  . Altered mental status 05/02/2018   . Anemia due to blood loss, acute 05/02/2018  . RLS (restless legs syndrome) 04/22/2018  . Wrist pain 04/22/2018  . Right hip pain 04/08/2018  . Otitis media 03/19/2018  . Suicidal ideation 03/19/2018  . Protein-calorie malnutrition, severe 12/03/2017  . Closed left hip fracture ([place]) 11/30/2017  . Diarrhea 11/15/2017  . Duodenum disorder 11/15/2017  . Frequency of urination 10/21/2017  . Cigarette smoker 10/06/2017  . Cough 09/25/2017  . Changes in vision 09/25/2017  . Proteinuria 09/25/2017  . Basal cell carcinoma 02/28/2017  . 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 DOE (dyspnea on exertion) 01/17/2017  . Chronic right upper quadrant pain 11/15/2016  . Balance problem 11/15/2016  . Simple renal cyst 10/21/2016  . Prostate nodule 10/16/2016  . Dysuria 10/16/2016  . CKD (chronic kidney disease) stage 3, GFR 30-59 ml/min (HCC) 08/05/2016  . Anemia 08/05/2016  . Anxiety and depression 08/05/2016  . Weight loss 08/05/2016  . Sleep disturbance 07/23/2016  . Hypertension   . Osteoarthritis   . Tobacco abuse   . 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 ANKLE FRACTURE SURGERY    . EXTERNAL EAR SURGERY    . INTRAMEDULLARY (IM) NAIL INTERTROCHANTERIC Left 12/01/2017   Procedure: INTRAMEDULLARY (IM) NAIL INTERTROCHANTRIC;  Surgeon: Thornton Park, MD;  Location: [facility] ORS;  Service: Orthopedics;  Laterality: Left;  . ROTATOR  CUFF REPAIR    . TONSILLECTOMY    . TYMPANOPLASTY W/ MASTOIDECTOMY     Patient with reports of mastoid surgery (appears to have had surgery on TM; Left).    Allergies: Allergies  Allergen Reactions  . Aspirin Other (See Comments)    bleeding    Home Medications: Medications Prior to Admission  Medication Sig Dispense Refill Last Dose  . amLODipine (NORVASC) 10 MG tablet Take 1 tablet (10 mg total) by mouth daily. 90 tablet 3 Unknown at Unknown  .  calcium-vitamin D (OSCAL WITH D) 500-200 MG-UNIT tablet Take 1 tablet by mouth 2 (two) times daily. 60 tablet 1 Unknown at Unknown  . carvedilol (COREG) 6.25 MG tablet Take 2 tablets (12.5 mg total) by mouth 2 (two) times daily with a meal. 60 tablet 5 Unknown at Unknown  . [employer] VITAMIN B12 1000 MCG tablet TAKE 1 TABLET BY MOUTH EVERY DAY 90 tablet 1 Unknown at Unknown  . diclofenac sodium (VOLTAREN) 1 % GEL Apply 2 g topically 4 (four) times daily as needed (left hip pain). 100 g 0 prn at prn  . ferrous sulfate 325 (65 FE) MG tablet Take 1 tablet (325 mg total) by mouth daily with breakfast. 90 tablet 1 Unknown at Unknown  . folic acid (FOLVITE) 1 MG tablet TAKE 1 TABLET BY MOUTH EVERY DAY 90 tablet 1 Unknown at Unknown  . gabapentin (NEURONTIN) 100 MG capsule Take 2 capsules (200 mg total) by mouth 3 (three) times daily. 20 capsule 0 Unknown at Unknown  . Multiple Vitamin (MULTIVITAMIN WITH MINERALS) TABS tablet Take 1 tablet by mouth daily. 30 tablet 0 Unknown at Unknown  . ranitidine (ZANTAC) 150 MG tablet Take 150 mg by mouth 2 (two) times daily.   Unknown at Unknown  . tamsulosin (FLOMAX) 0.4 MG CAPS capsule Take 1 capsule (0.4 mg total) by mouth daily. 30 capsule 3 Unknown at Unknown  . thiamine 100 MG tablet Take 1 tablet (100 mg total) by mouth daily. 30 tablet 0 Unknown at Unknown  . traMADol (ULTRAM) 50 MG tablet Take 1 tablet (50 mg total) by mouth every 8 (eight) hours as needed. 10 tablet 0 prn at prn  . venlafaxine XR (EFFEXOR-XR) 37.5 MG 24 hr capsule Take 1 tablet by mouth once daily for 7 days starting on 04/01/18, then take 2 tablets by mouth once daily 60 capsule 1 Unknown at Unknown  . vitamin C (VITAMIN C) 250 MG tablet Take 1 tablet (250 mg total) by mouth 2 (two) times daily. 30 tablet 0 Unknown at Unknown  . feeding supplement, ENSURE ENLIVE, (ENSURE ENLIVE) LIQD Take 237 mLs by mouth 2 (two) times daily between meals. 237 mL 12 Taking   Home medication reconciliation was  completed with the patient.   Scheduled Inpatient Medications:   . pantoprazole (PROTONIX) IV  40 mg Intravenous Q12H    Continuous Inpatient Infusions:   . sodium chloride    . sodium chloride    . piperacillin-tazobactam (ZOSYN)  IV      PRN Inpatient Medications:    Family History: family history is not on file.   GI Family History: Negative.  Social History:   reports that he has been smoking cigarettes. He has a 70.00 pack-year smoking history. He has never used smokeless tobacco. He reports that he does not drink alcohol or use drugs. The patient denies ETOH, tobacco, or drug use.    Review of Systems: Review of Systems - Negative except That in the history  of present illness  Physical Examination: BP (!) 153/64   Pulse (!) 102   Temp 97.7 F (36.5 C) (Oral)   Resp (!) 25   Ht 5\' 5"  (4.665 m)   Wt 56.1 kg   SpO2 99%   BMI 20.58 kg/m  Physical Exam Constitutional:      General: He is not in acute distress.    Appearance: He is ill-appearing. He is not diaphoretic.  HENT:     Head: Normocephalic and atraumatic.     Ears:     Comments: Evidence of left ear surgery noted around the pinna    Mouth/Throat:     Mouth: Mucous membranes are dry.  Eyes:     Extraocular Movements: Extraocular movements intact.     Conjunctiva/sclera: Conjunctivae normal.     Pupils: Pupils are equal, round, and reactive to light.  Neck:     Musculoskeletal: Normal range of motion.  Cardiovascular:     Rate and Rhythm: Normal rate.     Pulses: Normal pulses.     Heart sounds: No gallop.   Pulmonary:     Effort: No respiratory distress.     Breath sounds: No wheezing.  Abdominal:     General: Abdomen is flat. There is distension.     Palpations: Abdomen is soft.     Tenderness: There is abdominal tenderness. There is no guarding or rebound.  Musculoskeletal: Normal range of motion.  Skin:    General: Skin is warm and dry.  Neurological:     Mental Status: He is alert.      Comments: Poor historian. Poor memory to recent and remote medical events.   Psychiatric:     Comments: Patient is slightly agitated but agreeable. Has some disordered though processes and is generally irritable. No suicidal ideation.     Data: Lab Results  Component Value Date   WBC 22.7 (H) 05/02/2018   HGB 5.8 (L) 05/02/2018   HCT 18.4 (L) 05/02/2018   MCV 92.9 05/02/2018   PLT 390 05/02/2018   Recent Labs  Lab 05/02/18 0653 05/02/18 0937  HGB 3.2* 5.8*   Lab Results  Component Value Date   NA 140 05/02/2018   K 4.9 05/02/2018   CL 103 05/02/2018   CO2 13 (L) 05/02/2018   BUN 81 (H) 05/02/2018   CREATININE 3.25 (H) 05/02/2018   Lab Results  Component Value Date   ALT 16 05/02/2018   AST 31 05/02/2018   ALKPHOS 130 (H) 05/02/2018   BILITOT 0.6 05/02/2018   Recent Labs  Lab 05/02/18 0653  INR 1.1   CBC Latest Ref Rng & Units 05/02/2018 05/02/2018 03/13/2018  WBC 4.0 - 10.5 K/uL 22.7(H) 21.4(H) 10.9(H)  Hemoglobin 13.0 - 17.0 g/dL 5.8(L) 3.2(LL) 12.3(L)  Hematocrit 39.0 - 52.0 % 18.4(L) 10.7(LL) 37.6(L)  Platelets 150 - 400 K/uL 390 514(H) 343    STUDIES: Ct Abdomen Pelvis Wo Contrast  Result Date: 05/02/2018 CLINICAL DATA:  84 year old male found down covered in his own feces. EXAM: CT ABDOMEN AND PELVIS WITHOUT CONTRAST TECHNIQUE: Multidetector CT imaging of the abdomen and pelvis was performed following the standard protocol without IV contrast. COMPARISON:  CT the abdomen and pelvis 11/30/2017. FINDINGS: Lower chest: Atherosclerotic calcifications in the thoracic aorta as well as the right coronary artery. Diffuse low-attenuation throughout the intravascular compartment, suggestive of anemia. Hepatobiliary: No definite suspicious cystic or solid hepatic lesions are confidently identified on today's noncontrast CT examination. Unenhanced appearance of the gallbladder is unremarkable. Pancreas: No  definite pancreatic mass or peripancreatic fluid or inflammatory  changes identified on today's noncontrast CT examination. Spleen: Unremarkable. Adrenals/Urinary Tract: Mild bilateral renal atrophy. Low-attenuation lesions in the left kidney, incompletely characterized on today's non-contrast CT examination, but likely to represent cysts, measuring up to 2.5 cm in the upper pole. Bilateral adrenal glands are normal in appearance. No hydroureteronephrosis. Urinary bladder is normal in appearance. Stomach/Bowel: Normal appearance of the stomach. No pathologic dilatation of small bowel or colon. Rectal temperature probe in place. Normal appendix. Vascular/Lymphatic: Aortic atherosclerosis. No lymphadenopathy noted in the abdomen or pelvis. Reproductive: Prostate gland and seminal vesicles are unremarkable in appearance. Other: No significant volume of ascites.  No pneumoperitoneum. Musculoskeletal: Orthopedic fixation hardware in the left proximal femur incidentally noted. There are no aggressive appearing lytic or blastic lesions noted in the visualized portions of the skeleton. IMPRESSION: 1. No acute findings are noted in the abdomen or pelvis. 2. Aortic atherosclerosis, in addition to at least right coronary artery disease. 3. Low-attenuation throughout the intravascular compartment, suggestive of anemia. 4. Additional incidental findings, as above. Electronically Signed   By: Vinnie Langton M.D.   On: 05/02/2018 08:31   Dg Chest 1 View  Result Date: 05/02/2018 CLINICAL DATA:  Patient hypothermic. Headache. EXAM: CHEST  1 VIEW COMPARISON:  11/30/2017. FINDINGS: Heart is enlarged. Calcified tortuous aorta. Multiple healed rib fractures. No consolidation or edema. Mild interstitial prominence likely related to chronic tobacco abuse. IMPRESSION: Cardiomegaly. No active disease. Electronically Signed   By: Staci Righter M.D.   On: 05/02/2018 07:12   Ct Head Wo Contrast  Result Date: 05/02/2018 CLINICAL DATA:  Patient found lying on floor known feces. Patient is hypothermic  from head to toe. No obvious trauma. EXAM: CT HEAD WITHOUT CONTRAST TECHNIQUE: Contiguous axial images were obtained from the base of the skull through the vertex without intravenous contrast. COMPARISON:  November 30, 2016 FINDINGS: Brain: No subdural, epidural, or subarachnoid hemorrhage. Brainstem, basal cisterns, and cerebellum are unchanged. Probable small lacunar infarct just to the left of midline in the cerebellum on axial image 9, unchanged since November 2011. Ventricles and sulci are prominent but stable. Chronic white matter changes are again identified. A small lacunar infarct in the posterior limb of the left internal capsule on axial image 19 is slightly more conspicuous in the interval. No acute cortical ischemia or infarct noted. No mass effect or midline shift. Vascular: No hyperdense vessel or unexpected calcification. Skull: Normal. Negative for fracture or focal lesion. Sinuses/Orbits: Mucosal thickening in the right maxillary sinus. Paranasal sinuses otherwise normal. The right mastoid air cells and right middle ear are well aerated. Patient is status post left mastoidectomy with chronically opacified middle ear. Other: None. IMPRESSION: 1. A lacunar infarct in the posterior limb of the left internal capsule is more conspicuous in the interval. The difference could be due to difference in slice selection. No other acute intracranial abnormalities. Electronically Signed   By: Dorise Bullion III M.D   On: 05/02/2018 08:36   @IMAGES @  Assessment: 1. GI bleeding - Appears by history to have been ongoing for at least the last 3 days with patient refusing hospitalization early during the bleeding course. UGI vs LGI source. Possible PUD (duodenal most likely), mallory weiss tear, mesenteric ischemia, ischemic colitis, diverticular bleed, colon or UGI malignancy.  Early on in the bleed, it appeared to be upper GI, currently has red clots per rectum on the bed sheet suggesting a LGI source. Just  during the writing of this  note, the patient vomited brownish reddish material without clots or hematin.   2. Anemia secondary to GI hemorrhage.  3. Clostridium Dificile Colitis - Treatment per Dr. Marthann Schiller.  4. Chronic pain syndrome with family reportng history of narcotic abuse.  5. History of alcohol abuse.   6. DNR status - Patient and son still would like full medical care, procedures and evaluation short of CPR and ventilator support.   7. Sepsis with hypotension. Currently BP is stable in the 110's.   Recommendations:  1. Tagged rbc scan today to help localize GI source of bleeding. 2. Consent for EGD and flexible sigmoidoscopy. I discussed with both the patient and his son regarding these procedures. The patient understands the nature of the planned procedure, indications, risks, alternatives and potential complications including but not limited to bleeding, infection, perforation, damage to internal organs and possible oversedation/side effects from anesthesia. The patient and his son, Justin Andrews, agree and give consent to proceed.  Please refer to procedure notes for findings, recommendations and  disposition/instructions.. Will switch to colonoscopy if evidence of right colonic bleed on bleeding scan. Also may discuss with vascular team if indicated . 3. If trbc scan + endocopic procedures unsuccessful at localizing or treating source of bleed, may require laparotomy by general surgery.  4. IV acid suppression. 5. NPO 6. Following closely.   Thank you for the consult. Please call with questions or concerns.  Olean Ree, "Lanny Hurst MD [facility] [place],  [id number] [phone]  05/02/2018 1:24 PM

## 2018-05-02 NOTE — ED Notes (Signed)
Pt cleaned and bed linens changed before transport.

## 2018-05-02 NOTE — ED Notes (Signed)
ED TO INPATIENT HANDOFF REPORT  ED Nurse Name and Phone #: Janett Billow 84  S Name/Age/Gender Justin Andrews 84 y.o. male Room/Bed: ED10A/ED10A  Code Status   Code Status: DNR  Home/SNF/Other Unknown-pt fell at home and no one checked on him for 3 days?? Patient oriented to: self and place Is this baseline? No   Triage Complete: Triage complete  Chief Complaint Abdominal pain  Triage Note Pt arrives from home, was lying on floor in feces for unknown time. Pt arrives hypothermic, covered in dried stool from head to toe. Pt states he has a headahce. No obvious trauma.    Allergies Allergies  Allergen Reactions  . Aspirin Other (See Comments)    bleeding    Level of Care/Admitting Diagnosis ED Disposition    ED Disposition Condition [facility] Area: [place] [[id number]]  Level of Care: Stepdown [14]  Covid Evaluation: N/A  Diagnosis: GI bleed [[id number]]  Admitting Physician: Vaughan Basta [[id number]]  Attending Physician: Vaughan Basta [phone]  Estimated length of stay: past midnight tomorrow  Certification:: I certify this patient will need inpatient services for at least 2 midnights  PT Class (Do Not Modify): Inpatient [101]  PT Acc Code (Do Not Modify): Private [1]       B Medical/Surgery History Past Medical History:  Diagnosis Date  . C. difficile diarrhea 11/2017  . Elevated serum creatinine   . History of melanoma   . Hypertension   . Osteoarthritis   . Tobacco abuse    Past Surgical History:  Procedure Laterality Date  . ANKLE FRACTURE SURGERY    . EXTERNAL EAR SURGERY    . INTRAMEDULLARY (IM) NAIL INTERTROCHANTERIC Left 12/01/2017   Procedure: INTRAMEDULLARY (IM) NAIL INTERTROCHANTRIC;  Surgeon: Thornton Park, MD;  Location: [facility] ORS;  Service: Orthopedics;  Laterality: Left;  . ROTATOR CUFF REPAIR    . TONSILLECTOMY    . TYMPANOPLASTY W/ MASTOIDECTOMY     Patient with reports of mastoid  surgery (appears to have had surgery on TM; Left).     A IV Location/Drains/Wounds Patient Lines/Drains/Airways Status   Active Line/Drains/Airways    Name:   Placement date:   Placement time:   Site:   Days:   Peripheral IV 05/02/18 Left Arm   05/02/18    0822    Arm   less than 1   Peripheral IV 05/02/18 Right Forearm   05/02/18    0822    Forearm   less than 1   Incision (Closed) 12/01/17 Hip Left   12/01/17    1820     152          Intake/Output Last 24 hours  Intake/Output Summary (Last 24 hours) at 05/02/2018 1031 Last data filed at 05/02/2018 1018 Gross per 24 hour  Intake 500 ml  Output -  Net 500 ml    Labs/Imaging Results for orders placed or performed during the hospital encounter of 05/02/18 (from the past 48 hour(s))  Comprehensive metabolic panel     Status: Abnormal   Collection Time: 05/02/18  6:53 AM  Result Value Ref Range   Sodium 140 135 - 145 mmol/L    Comment: REPEATED LYTES JJB   Potassium 4.9 3.5 - 5.1 mmol/L   Chloride 103 98 - 111 mmol/L   CO2 13 (L) 22 - 32 mmol/L   Glucose, Bld 195 (H) 70 - 99 mg/dL   BUN 81 (H) 8 - 23 mg/dL   Creatinine, Ser 3.25 (  H) 0.61 - 1.24 mg/dL   Calcium 8.7 (L) 8.9 - 10.3 mg/dL   Total Protein 6.1 (L) 6.5 - 8.1 g/dL   Albumin 2.7 (L) 3.5 - 5.0 g/dL   AST 31 15 - 41 U/L   ALT 16 0 - 44 U/L   Alkaline Phosphatase 130 (H) 38 - 126 U/L   Total Bilirubin 0.6 0.3 - 1.2 mg/dL   GFR calc non Af Amer 17 (L) >60 mL/min   GFR calc Af Amer 19 (L) >60 mL/min   Anion gap 24 (H) 5 - 15    Comment: Performed at [facility], [address] [place], [place] [address]  Lactic acid, plasma     Status: Abnormal   Collection Time: 05/02/18  6:53 AM  Result Value Ref Range   Lactic Acid, Venous >11.0 (HH) 0.5 - 1.9 mmol/L    Comment: CRITICAL RESULT CALLED TO, READ BACK BY AND VERIFIED WITH JESSICA COLTRAIA AT 4010 ON 05/02/2018 QSD/JJB Performed at [facility], [address] [place], [place] [address]    CBC with Differential     Status: Abnormal   Collection Time: 05/02/18  6:53 AM  Result Value Ref Range   WBC 21.4 (H) 4.0 - 10.5 K/uL   RBC 1.09 (L) 4.22 - 5.81 MIL/uL   Hemoglobin 3.2 (LL) 13.0 - 17.0 g/dL    Comment: This critical result has verified and been called to JESSICA COLTRAIN by Silvana Newness on 04 25 2020 at 0728, and has been read back.    HCT 10.7 (LL) 39.0 - 52.0 %    Comment: This critical result has verified and been called to JESSICA COLTRAIN by Silvana Newness on 04 25 2020 at 0728, and has been read back.    MCV 98.2 80.0 - 100.0 fL   MCH 29.4 26.0 - 34.0 pg   MCHC 29.9 (L) 30.0 - 36.0 g/dL   RDW 17.6 (H) 11.5 - 15.5 %   Platelets 514 (H) 150 - 400 K/uL   nRBC 2.2 (H) 0.0 - 0.2 %   Neutrophils Relative % 92 %   Neutro Abs 19.8 (H) 1.7 - 7.7 K/uL   Lymphocytes Relative 1 %   Lymphs Abs 0.3 (L) 0.7 - 4.0 K/uL   Monocytes Relative 4 %   Monocytes Absolute 0.8 0.1 - 1.0 K/uL   Eosinophils Relative 1 %   Eosinophils Absolute 0.1 0.0 - 0.5 K/uL   Basophils Relative 0 %   Basophils Absolute 0.0 0.0 - 0.1 K/uL   Immature Granulocytes 2 %   Abs Immature Granulocytes 0.42 (H) 0.00 - 0.07 K/uL    Comment: Performed at [facility], [address] [place], [place] [address]  Protime-INR     Status: None   Collection Time: 05/02/18  6:53 AM  Result Value Ref Range   Prothrombin Time 14.5 11.4 - 15.2 seconds   INR 1.1 0.8 - 1.2    Comment: (NOTE) INR goal varies based on device and disease states. Performed at [facility], [address] [place], [place] [address]   Troponin I - Once     Status: Abnormal   Collection Time: 05/02/18  6:53 AM  Result Value Ref Range   Troponin I 0.07 (HH) <0.03 ng/mL    Comment: CRITICAL RESULT CALLED TO, READ BACK BY AND VERIFIED WITH JESSICA COLTRIA AT 5956 ON 05/02/2018 QSD/JJB Performed at [facility], [address] [place], [place] [address]   CK  Status: None   Collection Time:  05/02/18  6:53 AM  Result Value Ref Range   Total CK 92 49 - 397 U/L    Comment: Performed at [facility], [address] [place], [place] [address]  Urinalysis, Complete w Microscopic     Status: Abnormal   Collection Time: 05/02/18  7:41 AM  Result Value Ref Range   Color, Urine YELLOW (A) YELLOW   APPearance HAZY (A) CLEAR   Specific Gravity, Urine 1.012 1.005 - 1.030   pH 5.0 5.0 - 8.0   Glucose, UA NEGATIVE NEGATIVE mg/dL   Hgb urine dipstick NEGATIVE NEGATIVE   Bilirubin Urine NEGATIVE NEGATIVE   Ketones, ur NEGATIVE NEGATIVE mg/dL   Protein, ur NEGATIVE NEGATIVE mg/dL   Nitrite NEGATIVE NEGATIVE   Leukocytes,Ua NEGATIVE NEGATIVE   WBC, UA 0-5 0 - 5 WBC/hpf   Bacteria, UA NONE SEEN NONE SEEN   Squamous Epithelial / LPF 0-5 0 - 5   Mucus PRESENT    Hyaline Casts, UA PRESENT     Comment: Performed at [facility], [address] [place], [place] [address]  Type and screen [place]     Status: None (Preliminary result)   Collection Time: 05/02/18  7:41 AM  Result Value Ref Range   ABO/RH(D) A POS    Antibody Screen NEG    Sample Expiration 05/05/2018    Unit Number [id number]    Blood Component Type RED CELLS,LR    Unit division 00    Status of Unit ISSUED    Unit tag comment EMERGENCY RELEASE    Transfusion Status OK TO TRANSFUSE    Crossmatch Result COMPATIBLE    Unit Number [id number]    Blood Component Type RED CELLS,LR    Unit division 00    Status of Unit ISSUED    Unit tag comment EMERGENCY RELEASE    Transfusion Status OK TO TRANSFUSE    Crossmatch Result COMPATIBLE   SARS Coronavirus 2 [facility] order, Performed in [place] hospital lab)     Status: None   Collection Time: 05/02/18  7:41 AM  Result Value Ref Range   SARS Coronavirus 2 NEGATIVE NEGATIVE    Comment: (NOTE) If result is NEGATIVE SARS-CoV-2 target nucleic acids are NOT DETECTED. The SARS-CoV-2 RNA is generally detectable in upper and  lower  respiratory specimens during the acute phase of infection. The lowest  concentration of SARS-CoV-2 viral copies this assay can detect is 250  copies / mL. A negative result does not preclude SARS-CoV-2 infection  and should not be used as the sole basis for treatment or other  patient management decisions.  A negative result may occur with  improper specimen collection / handling, submission of specimen other  than nasopharyngeal swab, presence of viral mutation(s) within the  areas targeted by this assay, and inadequate number of viral copies  (<250 copies / mL). A negative result must be combined with clinical  observations, patient history, and epidemiological information. If result is POSITIVE SARS-CoV-2 target nucleic acids are DETECTED. The SARS-CoV-2 RNA is generally detectable in upper and lower  respiratory specimens dur ing the acute phase of infection.  Positive  results are indicative of active infection with SARS-CoV-2.  Clinical  correlation with patient history and other diagnostic information is  necessary to determine patient infection status.  Positive results do  not rule out bacterial infection or co-infection with other viruses. If result is PRESUMPTIVE POSTIVE SARS-CoV-2 nucleic acids MAY BE  PRESENT.   A presumptive positive result was obtained on the submitted specimen  and confirmed on repeat testing.  While 2019 novel coronavirus  (SARS-CoV-2) nucleic acids may be present in the submitted sample  additional confirmatory testing may be necessary for epidemiological  and / or clinical management purposes  to differentiate between  SARS-CoV-2 and other Sarbecovirus currently known to infect humans.  If clinically indicated additional testing with an alternate test  methodology [phone]) is advised. The SARS-CoV-2 RNA is generally  detectable in upper and lower respiratory sp ecimens during the acute  phase of infection. The expected result is  Negative. Fact Sheet for Patients:  StrictlyIdeas.no Fact Sheet for Healthcare Providers: BankingDealers.co.za This test is not yet approved or cleared by the [place] FDA and has been authorized for detection and/or diagnosis of SARS-CoV-2 by FDA under an Emergency Use Authorization (EUA).  This EUA will remain in effect (meaning this test can be used) for the duration of the COVID-19 declaration under Section 564(b)(1) of the Act, 21 U.S.C. section 360bbb-3(b)(1), unless the authorization is terminated or revoked sooner. Performed at [facility], [address] [place], [place] [address]   Prepare RBC (crossmatch)     Status: None   Collection Time: 05/02/18  7:41 AM  Result Value Ref Range   Order Confirmation      ORDER PROCESSED BY BLOOD BANK Performed at [facility], [address] [place], [place] [address]   CBC     Status: Abnormal   Collection Time: 05/02/18  9:37 AM  Result Value Ref Range   WBC 22.7 (H) 4.0 - 10.5 K/uL   RBC 1.98 (L) 4.22 - 5.81 MIL/uL   Hemoglobin 5.8 (L) 13.0 - 17.0 g/dL    Comment: REPEATED TO VERIFY   HCT 18.4 (L) 39.0 - 52.0 %   MCV 92.9 80.0 - 100.0 fL   MCH 29.3 26.0 - 34.0 pg   MCHC 31.5 30.0 - 36.0 g/dL   RDW 16.3 (H) 11.5 - 15.5 %   Platelets 390 150 - 400 K/uL   nRBC 2.5 (H) 0.0 - 0.2 %    Comment: Performed at [facility], [address] [place], [place] [address]  Lactic acid, plasma     Status: Abnormal   Collection Time: 05/02/18  9:37 AM  Result Value Ref Range   Lactic Acid, Venous 10.4 (HH) 0.5 - 1.9 mmol/L    Comment: CRITICAL RESULT CALLED TO, READ BACK BY AND VERIFIED WITH JESSICA Aleece Loyd AT 1022 ON 05/02/2018 JJB Performed at [facility], [address] [place], [place] [address]   Glucose, capillary     Status: Abnormal   Collection Time: 05/02/18 10:17 AM  Result Value Ref Range   Glucose-Capillary 135 (H) 70 - 99 mg/dL    Ct Abdomen Pelvis Wo Contrast  Result Date: 05/02/2018 CLINICAL DATA:  84 year old male found down covered in his own feces. EXAM: CT ABDOMEN AND PELVIS WITHOUT CONTRAST TECHNIQUE: Multidetector CT imaging of the abdomen and pelvis was performed following the standard protocol without IV contrast. COMPARISON:  CT the abdomen and pelvis 11/30/2017. FINDINGS: Lower chest: Atherosclerotic calcifications in the thoracic aorta as well as the right coronary artery. Diffuse low-attenuation throughout the intravascular compartment, suggestive of anemia. Hepatobiliary: No definite suspicious cystic or solid hepatic lesions are confidently identified on today's noncontrast CT examination. Unenhanced appearance of the gallbladder is unremarkable. Pancreas: No definite pancreatic mass or peripancreatic fluid or inflammatory changes identified on today's noncontrast  CT examination. Spleen: Unremarkable. Adrenals/Urinary Tract: Mild bilateral renal atrophy. Low-attenuation lesions in the left kidney, incompletely characterized on today's non-contrast CT examination, but likely to represent cysts, measuring up to 2.5 cm in the upper pole. Bilateral adrenal glands are normal in appearance. No hydroureteronephrosis. Urinary bladder is normal in appearance. Stomach/Bowel: Normal appearance of the stomach. No pathologic dilatation of small bowel or colon. Rectal temperature probe in place. Normal appendix. Vascular/Lymphatic: Aortic atherosclerosis. No lymphadenopathy noted in the abdomen or pelvis. Reproductive: Prostate gland and seminal vesicles are unremarkable in appearance. Other: No significant volume of ascites.  No pneumoperitoneum. Musculoskeletal: Orthopedic fixation hardware in the left proximal femur incidentally noted. There are no aggressive appearing lytic or blastic lesions noted in the visualized portions of the skeleton. IMPRESSION: 1. No acute findings are noted in the abdomen or pelvis. 2. Aortic  atherosclerosis, in addition to at least right coronary artery disease. 3. Low-attenuation throughout the intravascular compartment, suggestive of anemia. 4. Additional incidental findings, as above. Electronically Signed   By: Vinnie Langton M.D.   On: 05/02/2018 08:31   Dg Chest 1 View  Result Date: 05/02/2018 CLINICAL DATA:  Patient hypothermic. Headache. EXAM: CHEST  1 VIEW COMPARISON:  11/30/2017. FINDINGS: Heart is enlarged. Calcified tortuous aorta. Multiple healed rib fractures. No consolidation or edema. Mild interstitial prominence likely related to chronic tobacco abuse. IMPRESSION: Cardiomegaly. No active disease. Electronically Signed   By: Staci Righter M.D.   On: 05/02/2018 07:12   Ct Head Wo Contrast  Result Date: 05/02/2018 CLINICAL DATA:  Patient found lying on floor known feces. Patient is hypothermic from head to toe. No obvious trauma. EXAM: CT HEAD WITHOUT CONTRAST TECHNIQUE: Contiguous axial images were obtained from the base of the skull through the vertex without intravenous contrast. COMPARISON:  November 30, 2016 FINDINGS: Brain: No subdural, epidural, or subarachnoid hemorrhage. Brainstem, basal cisterns, and cerebellum are unchanged. Probable small lacunar infarct just to the left of midline in the cerebellum on axial image 9, unchanged since November 2011. Ventricles and sulci are prominent but stable. Chronic white matter changes are again identified. A small lacunar infarct in the posterior limb of the left internal capsule on axial image 19 is slightly more conspicuous in the interval. No acute cortical ischemia or infarct noted. No mass effect or midline shift. Vascular: No hyperdense vessel or unexpected calcification. Skull: Normal. Negative for fracture or focal lesion. Sinuses/Orbits: Mucosal thickening in the right maxillary sinus. Paranasal sinuses otherwise normal. The right mastoid air cells and right middle ear are well aerated. Patient is status post left  mastoidectomy with chronically opacified middle ear. Other: None. IMPRESSION: 1. A lacunar infarct in the posterior limb of the left internal capsule is more conspicuous in the interval. The difference could be due to difference in slice selection. No other acute intracranial abnormalities. Electronically Signed   By: Dorise Bullion III M.D   On: 05/02/2018 08:36    Pending Labs Unresulted Labs (From admission, onward)    Start     Ordered   05/02/18 1028  Hemoglobin  Now then every 8 hours,   STAT    Comments:  Call MD, If < 7, Or drop  More than 1 gram/dl from previous report.    05/02/18 1028   05/02/18 1005  Prepare RBC  (Adult Blood Administration - PRBC)  ONCE - STAT,   STAT    Question Answer Comment  # of Units 2 units   Transfusion Indications Actively Bleeding / GI Bleed  Number of Units to Keep Ahead 2 units ahead   If emergent release call blood bank [place] [phone]      05/02/18 1006   05/02/18 0830  C Difficile Quick Screen w PCR reflex  Once,   STAT     05/02/18 0830   05/02/18 0830  Gastrointestinal Panel by PCR , Stool  Once,   STAT     05/02/18 0830   05/02/18 0649  Urine culture  Once,   STAT     05/02/18 0648   05/02/18 0647  Lactic acid, plasma  Now then every 2 hours,   STAT     05/02/18 0646   05/02/18 0647  Culture, blood (Routine x 2)  BLOOD CULTURE X 2,   STAT     05/02/18 0646   Signed and Held  Basic metabolic panel  Tomorrow morning,   R     Signed and Held   Signed and Held  CBC  Tomorrow morning,   R     Signed and Held          Vitals/Pain Today's Vitals   05/02/18 0959 05/02/18 1000 05/02/18 1017 05/02/18 1018  BP:  (!) 154/63  (!) 152/88  Pulse: 97  (!) 103 (!) 101  Resp:    (!) 24  Temp: (!) 94.9 F (34.9 C) (!) 94.9 F (34.9 C) (!) 95.2 F (35.1 C) (!) 95.3 F (35.2 C)  TempSrc:      SpO2: 100%  100% 100%  Weight:      PainSc:        Isolation Precautions Enteric precautions (UV  disinfection)  Medications Medications  metroNIDAZOLE (FLAGYL) IVPB 500 mg (500 mg Intravenous New Bag/Given 05/02/18 0945)  piperacillin-tazobactam (ZOSYN) IVPB 3.375 g (has no administration in time range)  0.9 %  sodium chloride infusion (has no administration in time range)  0.9 %  sodium chloride infusion (has no administration in time range)  sodium chloride 0.9 % bolus 1,000 mL (1,000 mLs Intravenous Bolus 05/02/18 0735)  fentaNYL (SUBLIMAZE) injection 25 mcg (25 mcg Intravenous Given 05/02/18 0739)  0.9 %  sodium chloride infusion (Manually program via Guardrails IV Fluids) ( Intravenous Stopped 05/02/18 0947)  pantoprazole (PROTONIX) injection 40 mg (40 mg Intravenous Given 05/02/18 1013)  sodium chloride 0.9 % bolus 500 mL (0 mLs Intravenous Stopped 05/02/18 1018)    Mobility non-ambulatory-at this time (unknown what normal mobility is) High fall risk   Focused Assessments Cardiac Assessment Handoff:  Cardiac Rhythm: Normal sinus rhythm Lab Results  Component Value Date   CKTOTAL 92 05/02/2018   TROPONINI 0.07 (HH) 05/02/2018   No results found for: DDIMER Does the Patient currently have chest pain? No     R Recommendations: See Admitting Provider Note  Report given to:   Additional Notes: GI bleed- first hemeglobin was 3.2 and second was 5.8 after 2 units of blood

## 2018-05-02 NOTE — Progress Notes (Signed)
Brought confused, Hypothermic, hypotensive, tachypneic.  Noted LOw Hb, Worsened renal func, Xray chest clear, GI bleed. Give 2 unit transfusion, IV fluids, Bear hugger in ER> stabilizing now.  Assessment and plan  - GI bleed - Acute blood loss anemia - Hx of C diff- test pending this time - Ac on CKD stage 3 - Altered mental status- CT with some changes.  Transfuse 2 units, give 2 more- Hb is 5.8 now. IV fluids, NPO, IV PPI Spoke to GI- supportive care now, Wait for C diff results  MRI brain once stable  DNR per son.

## 2018-05-02 NOTE — Progress Notes (Addendum)
Pt has remained alert and oriented to self, time, and place. Pt is irritable. Pt c/o persistent non-radiating, mid/lower abdominal pain/achiness- 4/10. Pt repositioned-GI to evaluate. Pt has remained in NSR on cardiac monitor. BP/HR WNL. Pt has remained on RA. SpO2 > 90%, lung sounds clear to auscultation.  Pt has received a total of 4U PRVC.  Pt has had multiple brown/red streaked/clotted, loose bowel movements. Skin integrity to the perineum and around the anus are severely compromised with open sores from MASD.  Pt was reportedly lying for days at his home in feces.  Pt will be transported to nuclear med for a bleeding scan to further guide therapy-GI to follow. Pt son, Sp[place], has been updated via phone.

## 2018-05-03 ENCOUNTER — Inpatient Hospital Stay: Payer: Medicare HMO | Admitting: Anesthesiology

## 2018-05-03 ENCOUNTER — Encounter
Admission: EM | Disposition: A | Discharge status: Home / Self Care | Payer: Self-pay | Source: Home / Self Care | Attending: Internal Medicine

## 2018-05-03 HISTORY — PX: ESOPHAGOGASTRODUODENOSCOPY (EGD) WITH PROPOFOL: SHX5813

## 2018-05-03 LAB — CBC
HCT: 22.3 % — ABNORMAL LOW (ref 39.0–52.0)
HCT: 21.4 % — ABNORMAL LOW (ref 39.0–52.0)
Hemoglobin: 7.7 g/dL — ABNORMAL LOW (ref 13.0–17.0)
Hemoglobin: 7.2 g/dL — ABNORMAL LOW (ref 13.0–17.0)
MCH: 30.2 pg (ref 26.0–34.0)
MCH: 29.9 pg (ref 26.0–34.0)
MCHC: 34.5 g/dL (ref 30.0–36.0)
MCHC: 33.6 g/dL (ref 30.0–36.0)
MCV: 87.5 fL (ref 80.0–100.0)
MCV: 88.8 fL (ref 80.0–100.0)
Platelets: 322 10*3/uL (ref 150–400)
Platelets: 337 10*3/uL (ref 150–400)
RBC: 2.55 MIL/uL — ABNORMAL LOW (ref 4.22–5.81)
RBC: 2.41 MIL/uL — ABNORMAL LOW (ref 4.22–5.81)
RDW: 15.9 % — ABNORMAL HIGH (ref 11.5–15.5)
RDW: 16.8 % — ABNORMAL HIGH (ref 11.5–15.5)
WBC: 23.3 10*3/uL — ABNORMAL HIGH (ref 4.0–10.5)
WBC: 22.3 10*3/uL — ABNORMAL HIGH (ref 4.0–10.5)
nRBC: 5.8 % — ABNORMAL HIGH (ref 0.0–0.2)
nRBC: 2.6 % — ABNORMAL HIGH (ref 0.0–0.2)

## 2018-05-03 LAB — BASIC METABOLIC PANEL
Anion gap: 7 (ref 5–15)
Anion gap: 8 (ref 5–15)
BUN: 70 mg/dL — ABNORMAL HIGH (ref 8–23)
BUN: 51 mg/dL — ABNORMAL HIGH (ref 8–23)
CO2: 20 mmol/L — ABNORMAL LOW (ref 22–32)
CO2: 17 mmol/L — ABNORMAL LOW (ref 22–32)
Calcium: 6.9 mg/dL — ABNORMAL LOW (ref 8.9–10.3)
Calcium: 6.9 mg/dL — ABNORMAL LOW (ref 8.9–10.3)
Chloride: 113 mmol/L — ABNORMAL HIGH (ref 98–111)
Chloride: 109 mmol/L (ref 98–111)
Creatinine, Ser: 2.76 mg/dL — ABNORMAL HIGH (ref 0.61–1.24)
Creatinine, Ser: 2.34 mg/dL — ABNORMAL HIGH (ref 0.61–1.24)
GFR calc Af Amer: 24 mL/min — ABNORMAL LOW (ref >60)
GFR calc Af Amer: 29 mL/min — ABNORMAL LOW (ref >60)
GFR calc non Af Amer: 20 mL/min — ABNORMAL LOW (ref >60)
GFR calc non Af Amer: 25 mL/min — ABNORMAL LOW (ref >60)
Glucose, Bld: 133 mg/dL — ABNORMAL HIGH (ref 70–99)
Glucose, Bld: 174 mg/dL — ABNORMAL HIGH (ref 70–99)
Potassium: 3.5 mmol/L (ref 3.5–5.1)
Potassium: 3.2 mmol/L — ABNORMAL LOW (ref 3.5–5.1)
Sodium: 140 mmol/L (ref 135–145)
Sodium: 134 mmol/L — ABNORMAL LOW (ref 135–145)

## 2018-05-03 LAB — MAGNESIUM: Magnesium: 2 mg/dL (ref 1.7–2.4)

## 2018-05-03 LAB — URINE CULTURE: Culture: NO GROWTH

## 2018-05-03 LAB — HEMOGLOBIN
Hemoglobin: 7.3 g/dL — ABNORMAL LOW (ref 13.0–17.0)
Hemoglobin: 7.7 g/dL — ABNORMAL LOW (ref 13.0–17.0)

## 2018-05-03 LAB — LACTIC ACID, PLASMA: Lactic Acid, Venous: 1.3 mmol/L (ref 0.5–1.9)

## 2018-05-03 LAB — GLUCOSE, CAPILLARY
Glucose-Capillary: 121 mg/dL — ABNORMAL HIGH (ref 70–99)
Glucose-Capillary: 99 mg/dL (ref 70–99)

## 2018-05-03 LAB — PHOSPHORUS: Phosphorus: 3.1 mg/dL (ref 2.5–4.6)

## 2018-05-03 SURGERY — ESOPHAGOGASTRODUODENOSCOPY (EGD) WITH PROPOFOL
Anesthesia: General

## 2018-05-03 MED ORDER — LIDOCAINE 2% (20 MG/ML) 5 ML SYRINGE
INTRAMUSCULAR | Status: DC | PRN
  Administered 2018-05-03: 25 mg via INTRAVENOUS

## 2018-05-03 MED ORDER — PROPOFOL 500 MG/50ML IV EMUL
INTRAVENOUS | Status: DC | PRN
  Administered 2018-05-03: 100 ug/kg/min via INTRAVENOUS

## 2018-05-03 MED ORDER — EPINEPHRINE 1 MG/10ML IJ SOSY
PREFILLED_SYRINGE | INTRAMUSCULAR | Status: AC
  Filled 2018-05-03: qty 10

## 2018-05-03 MED ORDER — SODIUM CHLORIDE 0.9 % IV SOLN
INTRAVENOUS | Status: DC
  Administered 2018-05-03: 03:00:00 via INTRAVENOUS

## 2018-05-03 MED ORDER — PROPOFOL 10 MG/ML IV BOLUS
INTRAVENOUS | Status: DC | PRN
  Administered 2018-05-03: 50 mg via INTRAVENOUS
  Administered 2018-05-03: 20 mg via INTRAVENOUS
  Administered 2018-05-03: 50 mg via INTRAVENOUS

## 2018-05-03 MED ORDER — EPINEPHRINE PF 1 MG/ML IJ SOLN
INTRAMUSCULAR | Status: DC | PRN
  Administered 2018-05-03: 6 mg
  Administered 2018-05-03: 2 mg

## 2018-05-03 MED ORDER — FENTANYL CITRATE (PF) 100 MCG/2ML IJ SOLN
12.5000 ug | Freq: Once | INTRAMUSCULAR | Status: AC
  Administered 2018-05-03: 20:00:00 12.5 ug via INTRAVENOUS
  Filled 2018-05-03: qty 2

## 2018-05-03 NOTE — Interval H&P Note (Signed)
History and Physical Interval Note:  05/03/2018 5:47 PM  Justin Andrews  has presented today for surgery, with the diagnosis of Acute GI bleed, anemia secondary to blood loss.  The various methods of treatment have been discussed with the patient and family. After consideration of risks, benefits and other options for treatment, the patient has consented to  Procedure(s): ESOPHAGOGASTRODUODENOSCOPY (EGD) WITH PROPOFOL (N/A) FLEXIBLE SIGMOIDOSCOPY (N/A) as a surgical intervention.  The patient's history has been reviewed, patient examined, no change in status, stable for surgery.  I have reviewed the patient's chart and labs.  Questions were answered to the patient's satisfaction.     [name], [name]

## 2018-05-03 NOTE — Op Note (Signed)
[facility] Patient Name: Justin Andrews Procedure Date: 05/03/2018 5:48 PM MRN: [id number] Account #: [id number] Date of Birth: 06/12/1934 Admit Type: Inpatient Age: 84 Room: [facility] ENDO ROOM 4 Gender: Male Note Status: Finalized Procedure:            Upper GI endoscopy Indications:          Acute post hemorrhagic anemia, Recent gastrointestinal                        bleeding Providers:            Benay Pike. Alice Reichert MD, MD Referring MD:         Angela Adam. Caryl Bis (Referring MD) Medicines:            Propofol per Anesthesia Complications:        No immediate complications. Procedure:            Pre-Anesthesia Assessment:                       - The risks and benefits of the procedure and the                        sedation options and risks were discussed with the                        patient. All questions were answered and informed                        consent was obtained.                       - Patient identification and proposed procedure were                        verified prior to the procedure by the nurse. The                        procedure was verified in the procedure room.                       - ASA Grade Assessment: III - A patient with severe                        systemic disease.                       - After reviewing the risks and benefits, the patient                        was deemed in satisfactory condition to undergo the                        procedure.                       After obtaining informed consent, the endoscope was                        passed under direct vision. Throughout the procedure,  the patient's blood pressure, pulse, and oxygen                        saturations were monitored continuously. The Endoscope                        was introduced through the mouth, and advanced to the                        third part of duodenum. The upper GI endoscopy was   accomplished without difficulty. The patient tolerated                        the procedure well. Findings:      The entire examined stomach was normal.      One partially obstructing oozing cratered duodenal ulcer with a visible       vessel was found in the second portion of the duodenum. The lesion was       15 mm in largest dimension. There is no evidence of perforation. Area       was unsuccessfully injected with 8 mL of a 1:10,000 solution of       epinephrine for hemostasis. Coagulation for hemostasis using the       standard gastroscope with 7 Fr gold bipolar probe was       unsuccessful.[Result]. The scope was withdrawn and replaced with the       therapeutic endoscope because of bleeding. Coagulation for hemostasis       using 10 Fr Gold probe through larger channel of therapeutic gastroscope       was successful. Estimated blood loss: 50 mL requiring treatment as       stated above. [EBL Treatment].      Patchy, white plaques were found in the entire esophagus. Brush samples       were taken for KOH.      There is no endoscopic evidence of bleeding, areas of erosion,       ulcerations or varices in the entire esophagus. Impression:           - Normal esophagus.                       - Normal stomach.                       - Partially obstructing oozing duodenal ulcer with a                        visible vessel. There is no evidence of perforation.                        Treatment not successful. Treated with bipolar cautery.                        SKIP.                       - No specimens collected. Recommendation:       - Return patient to ICU for ongoing care.                       - Clear liquid diet. Procedure Code(s):    --- Professional ---  43255, Esophagogastroduodenoscopy, flexible, transoral;                        with control of bleeding, any method Diagnosis Code(s):    --- Professional ---                       K92.2, Gastrointestinal  hemorrhage, unspecified                       D62, Acute posthemorrhagic anemia                       K26.4, Chronic or unspecified duodenal ulcer with                        hemorrhage CPT copyright 2019 American Medical Association. All rights reserved. The codes documented in this report are preliminary and upon coder review may  be revised to meet current compliance requirements. Efrain Sella MD, MD 05/03/2018 6:49:08 PM This report has been signed electronically. Number of Addenda: 0 Note Initiated On: 05/03/2018 5:48 PM      [facility]

## 2018-05-03 NOTE — Transfer of Care (Signed)
Immediate Anesthesia Transfer of Care Note  Patient: Justin Andrews  Procedure(s) Performed: ESOPHAGOGASTRODUODENOSCOPY (EGD) WITH PROPOFOL (N/A )  Patient Location: Endoscopy Unit  Anesthesia Type:General  Level of Consciousness: sedated  Airway & Oxygen Therapy: Patient Spontanous Breathing and Patient connected to nasal cannula oxygen  Post-op Assessment: Report given to RN and Post -op Vital signs reviewed and stable  Post vital signs: Reviewed  Last Vitals:  Vitals Value Taken Time  BP 159/76 05/03/2018  6:33 PM  Temp    Pulse 85 05/03/2018  6:33 PM  Resp 16 05/03/2018  6:33 PM  SpO2 99 % 05/03/2018  6:33 PM    Last Pain:  Vitals:   05/03/18 1749  TempSrc: Tympanic  PainSc: 0-No pain         Complications: No apparent anesthesia complications

## 2018-05-03 NOTE — Progress Notes (Signed)
[facility] NAME: Justin Andrews    MR#:  [id number]  DATE OF BIRTH:  Oct 09, 1934  SUBJECTIVE:  CHIEF COMPLAINT:   Chief Complaint  Patient presents with  . Fall  . Cold Exposure   Appears stable, no complaint, has some confusion.  Continue to have some dark bowel movement overnight.  REVIEW OF SYSTEMS:  CONSTITUTIONAL: No fever, fatigue or weakness.  EYES: No blurred or double vision.  EARS, NOSE, AND THROAT: No tinnitus or ear pain.  RESPIRATORY: No cough, shortness of breath, wheezing or hemoptysis.  CARDIOVASCULAR: No chest pain, orthopnea, edema.  GASTROINTESTINAL: No nausea, vomiting, diarrhea or abdominal pain.  GENITOURINARY: No dysuria, hematuria.  ENDOCRINE: No polyuria, nocturia,  HEMATOLOGY: No anemia, easy bruising or bleeding SKIN: No rash or lesion. MUSCULOSKELETAL: No joint pain or arthritis.   NEUROLOGIC: No tingling, numbness, weakness.  PSYCHIATRY: No anxiety or depression.   ROS  DRUG ALLERGIES:   Allergies  Allergen Reactions  . Aspirin Other (See Comments)    bleeding    VITALS:  Blood pressure 139/69, pulse 74, temperature 98 F (36.7 C), temperature source Oral, resp. rate (!) 21, height 5\' 5"  (1.651 m), weight 56.1 kg, SpO2 94 %.  PHYSICAL EXAMINATION:   GENERAL:  84 y.o.-year-old patient lying in the bed with critical appearance. EYES: Pupils equal, round, reactive to light and accommodation. No scleral icterus. Extraocular muscles intact.  Conjunctive are pale. HEENT: Head atraumatic, normocephalic. Oropharynx and nasopharynx clear.  NECK:  Supple, no jugular venous distention. No thyroid enlargement, no tenderness.  LUNGS: Normal breath sounds bilaterally, no wheezing, rales,rhonchi or crepitation. No use of accessory muscles of respiration.  CARDIOVASCULAR: S1, S2 normal. No murmurs, rubs, or gallops.  ABDOMEN: Soft, nontender, nondistended. Bowel sounds present. No organomegaly or mass.   Bleeding per rectum present. EXTREMITIES: No pedal edema, cyanosis, or clubbing.  NEUROLOGIC: Cranial nerves II through XII are intact. Muscle strength 3-4/5 in all extremities. Sensation intact. Gait not checked.  PSYCHIATRIC: The patient is alert and oriented x 1.  SKIN: No obvious rash, lesion, or ulcer.   Physical Exam LABORATORY PANEL:   CBC Recent Labs  Lab 05/03/18 0627 05/03/18 1045  WBC 23.3*  --   HGB 7.7* 7.7*  HCT 22.3*  --   PLT 322  --    ------------------------------------------------------------------------------------------------------------------  Chemistries  Recent Labs  Lab 05/02/18 0653 05/02/18 1956 05/03/18 0627  NA 140 141 140  K 4.9 4.0 3.5  CL 103 110 113*  CO2 13* 18* 20*  GLUCOSE 195* 130* 133*  BUN 81* 78* 70*  CREATININE 3.25* 2.90* 2.76*  CALCIUM 8.7* 7.1* 6.9*  MG  --  2.4  --   AST 31  --   --   ALT 16  --   --   ALKPHOS 130*  --   --   BILITOT 0.6  --   --    ------------------------------------------------------------------------------------------------------------------  Cardiac Enzymes Recent Labs  Lab 05/02/18 0653 05/02/18 1956  TROPONINI 0.07* 0.05*   ------------------------------------------------------------------------------------------------------------------  RADIOLOGY:  Ct Abdomen Pelvis Wo Contrast  Result Date: 05/02/2018 CLINICAL DATA:  84 year old male found down covered in his own feces. EXAM: CT ABDOMEN AND PELVIS WITHOUT CONTRAST TECHNIQUE: Multidetector CT imaging of the abdomen and pelvis was performed following the standard protocol without IV contrast. COMPARISON:  CT the abdomen and pelvis 11/30/2017. FINDINGS: Lower chest: Atherosclerotic calcifications in the thoracic aorta as well as the right coronary artery. Diffuse low-attenuation throughout  the intravascular compartment, suggestive of anemia. Hepatobiliary: No definite suspicious cystic or solid hepatic lesions are confidently identified on  today's noncontrast CT examination. Unenhanced appearance of the gallbladder is unremarkable. Pancreas: No definite pancreatic mass or peripancreatic fluid or inflammatory changes identified on today's noncontrast CT examination. Spleen: Unremarkable. Adrenals/Urinary Tract: Mild bilateral renal atrophy. Low-attenuation lesions in the left kidney, incompletely characterized on today's non-contrast CT examination, but likely to represent cysts, measuring up to 2.5 cm in the upper pole. Bilateral adrenal glands are normal in appearance. No hydroureteronephrosis. Urinary bladder is normal in appearance. Stomach/Bowel: Normal appearance of the stomach. No pathologic dilatation of small bowel or colon. Rectal temperature probe in place. Normal appendix. Vascular/Lymphatic: Aortic atherosclerosis. No lymphadenopathy noted in the abdomen or pelvis. Reproductive: Prostate gland and seminal vesicles are unremarkable in appearance. Other: No significant volume of ascites.  No pneumoperitoneum. Musculoskeletal: Orthopedic fixation hardware in the left proximal femur incidentally noted. There are no aggressive appearing lytic or blastic lesions noted in the visualized portions of the skeleton. IMPRESSION: 1. No acute findings are noted in the abdomen or pelvis. 2. Aortic atherosclerosis, in addition to at least right coronary artery disease. 3. Low-attenuation throughout the intravascular compartment, suggestive of anemia. 4. Additional incidental findings, as above. Electronically Signed   By: Vinnie Langton M.D.   On: 05/02/2018 08:31   Dg Chest 1 View  Result Date: 05/02/2018 CLINICAL DATA:  Patient hypothermic. Headache. EXAM: CHEST  1 VIEW COMPARISON:  11/30/2017. FINDINGS: Heart is enlarged. Calcified tortuous aorta. Multiple healed rib fractures. No consolidation or edema. Mild interstitial prominence likely related to chronic tobacco abuse. IMPRESSION: Cardiomegaly. No active disease. Electronically Signed   By:  Staci Righter M.D.   On: 05/02/2018 07:12   Ct Head Wo Contrast  Result Date: 05/02/2018 CLINICAL DATA:  Patient found lying on floor known feces. Patient is hypothermic from head to toe. No obvious trauma. EXAM: CT HEAD WITHOUT CONTRAST TECHNIQUE: Contiguous axial images were obtained from the base of the skull through the vertex without intravenous contrast. COMPARISON:  November 30, 2016 FINDINGS: Brain: No subdural, epidural, or subarachnoid hemorrhage. Brainstem, basal cisterns, and cerebellum are unchanged. Probable small lacunar infarct just to the left of midline in the cerebellum on axial image 9, unchanged since November 2011. Ventricles and sulci are prominent but stable. Chronic white matter changes are again identified. A small lacunar infarct in the posterior limb of the left internal capsule on axial image 19 is slightly more conspicuous in the interval. No acute cortical ischemia or infarct noted. No mass effect or midline shift. Vascular: No hyperdense vessel or unexpected calcification. Skull: Normal. Negative for fracture or focal lesion. Sinuses/Orbits: Mucosal thickening in the right maxillary sinus. Paranasal sinuses otherwise normal. The right mastoid air cells and right middle ear are well aerated. Patient is status post left mastoidectomy with chronically opacified middle ear. Other: None. IMPRESSION: 1. A lacunar infarct in the posterior limb of the left internal capsule is more conspicuous in the interval. The difference could be due to difference in slice selection. No other acute intracranial abnormalities. Electronically Signed   By: Dorise Bullion III M.D   On: 05/02/2018 08:36   Nm Gi Blood Loss  Result Date: 05/02/2018 CLINICAL DATA:  84 year old with anemia (requiring transfusion) and multiple bloody stools throughout the day today. EXAM: NUCLEAR MEDICINE GASTROINTESTINAL BLEEDING SCAN TECHNIQUE: Sequential abdominal images were obtained following intravenous administration  of Tc-41m labeled red blood cells. RADIOPHARMACEUTICALS:  Twenty-two mCi Tc-9m pertechnetate in-vitro  labeled red cells. COMPARISON:  No prior nuclear imaging. CT abdomen and pelvis performed earlier same day is correlated. FINDINGS: Imaging was performed out to 1 hour. At this point, the patient did not wish to continue with further imaging. Faint abnormal activity is visible in the midline of the pelvis, initially seen on the 41-45 minute image. This is SUPERIOR to the activity within the urinary bladder. The expected activity in the blood pool, liver, spleen and stomach is present. IMPRESSION: Possible active bleeding from the rectum. However, no abnormality is seen in the rectum on the CT earlier today. Electronically Signed   By: Evangeline Dakin M.D.   On: 05/02/2018 18:50    ASSESSMENT AND PLAN:   Principal Problem:   GI bleed Active Problems:   Hemorrhagic shock (HCC)   Altered mental status   Anemia due to blood loss, acute  *Hemorrhagic shock Acute blood loss anemia GI bleed Hypothermia  After 4 units of transfusion hemoglobin stabilized, actively bleeding. GI physician is contacted by ER, monitor in stepdown unit. Keep n.p.o. and continue IV fluid. Continue IV PPI twice daily Blood pressure is stable with IV fluid and blood transfusion. GI had suggested to do nuclear medicine bleeding scan which showed some active bleed in the rectal area.  *Acute renal failure on CKD stage III This could be due to acute blood loss. I am not sure about NSAIDs use. Continue to monitor with IV fluids.  Electrolytes are currently under control but have low bicarb.  *Hypertension We will hold medication with presentation of shock.  *History of alcoholism as per son Continue vitamin supplements.  *C. difficile colitis - diarrhea Patient had history of C. difficile in recent past. WBC count is high currently. Checked positive for C. difficile, started on oral Vanco and IV  Flagyl.   All the records are reviewed and case discussed with Care Management/Social Workerr. Management plans discussed with the patient, family and they are in agreement.  CODE STATUS: DNR  TOTAL TIME TAKING CARE OF THIS PATIENT: 35 minutes.     POSSIBLE D/C IN 1-2 DAYS, DEPENDING ON CLINICAL CONDITION.   Vaughan Basta M.D on 05/03/2018   Between 7am to 6pm - Pager - [phone]  After 6pm go to [email or web address] - password [email or web address] [facility]  Office  [phone]  CC: Primary care physician; Leone Haven, MD  Note: This dictation was prepared with Dragon dictation along with smaller phrase technology. Any transcriptional errors that result from this process are unintentional.

## 2018-05-03 NOTE — Anesthesia Preprocedure Evaluation (Signed)
Anesthesia Evaluation  Patient identified by MRN, date of birth, ID band Patient awake    Reviewed: Allergy & Precautions, H&P , NPO status , Patient's Chart, lab work & pertinent test results, reviewed documented beta blocker date and time   History of Anesthesia Complications Negative for: history of anesthetic complications  Airway Mallampati: II   Neck ROM: full    Dental  (+) Poor Dentition, Dental Advidsory Given   Pulmonary neg shortness of breath, neg COPD, neg recent URI, Current Smoker,           Cardiovascular Exercise Tolerance: Poor hypertension, On Medications (-) angina+ DOE  (-) Past MI and (-) Cardiac Stents negative cardio ROS Normal cardiovascular exam(-) dysrhythmias (-) Valvular Problems/Murmurs     Neuro/Psych PSYCHIATRIC DISORDERS Anxiety Depression Dementia and confusion baseline history with ETOH abuse.  negative neurological ROS  negative psych ROS   GI/Hepatic negative GI ROS, (+) Cirrhosis       ,   Endo/Other  negative endocrine ROS  Renal/GU CRFRenal disease  negative genitourinary   Musculoskeletal   Abdominal   Peds  Hematology negative hematology ROS (+) Blood dyscrasia, anemia ,   Anesthesia Other Findings Past Medical History: No date: Elevated serum creatinine No date: History of melanoma No date: Hypertension No date: Osteoarthritis No date: Tobacco abuse Past Surgical History: No date: ANKLE FRACTURE SURGERY No date: EXTERNAL EAR SURGERY No date: ROTATOR CUFF REPAIR No date: TONSILLECTOMY No date: TYMPANOPLASTY W/ MASTOIDECTOMY     Comment:  Patient with reports of mastoid surgery (appears to have              had surgery on TM; Left). BMI    Body Mass Index:  21.74 kg/m     Reproductive/Obstetrics negative OB ROS                             Anesthesia Physical  Anesthesia Plan  ASA: IV  Anesthesia Plan: General   Post-op Pain  Management:    Induction: Intravenous  PONV Risk Score and Plan: 1 and Propofol infusion and TIVA  Airway Management Planned: Natural Airway and Nasal Cannula  Additional Equipment:   Intra-op Plan:   Post-operative Plan:   Informed Consent: I have reviewed the patients History and Physical, chart, labs and discussed the procedure including the risks, benefits and alternatives for the proposed anesthesia with the patient or authorized representative who has indicated his/her understanding and acceptance.     Dental Advisory Given  Plan Discussed with: CRNA  Anesthesia Plan Comments:         Anesthesia Quick Evaluation

## 2018-05-03 NOTE — Anesthesia Post-op Follow-up Note (Signed)
Anesthesia QCDR form completed.        

## 2018-05-04 ENCOUNTER — Encounter
Admission: EM | Disposition: A | Discharge status: Home / Self Care | Payer: Self-pay | Source: Home / Self Care | Attending: Internal Medicine

## 2018-05-04 ENCOUNTER — Encounter: Payer: Self-pay | Admitting: Internal Medicine

## 2018-05-04 DIAGNOSIS — I129 Hypertensive chronic kidney disease with stage 1 through stage 4 chronic kidney disease, or unspecified chronic kidney disease: Secondary | ICD-10-CM

## 2018-05-04 DIAGNOSIS — A0472 Enterocolitis due to Clostridium difficile, not specified as recurrent: Secondary | ICD-10-CM

## 2018-05-04 DIAGNOSIS — K264 Chronic or unspecified duodenal ulcer with hemorrhage: Secondary | ICD-10-CM

## 2018-05-04 DIAGNOSIS — N189 Chronic kidney disease, unspecified: Secondary | ICD-10-CM

## 2018-05-04 DIAGNOSIS — F17210 Nicotine dependence, cigarettes, uncomplicated: Secondary | ICD-10-CM

## 2018-05-04 HISTORY — PX: VISCERAL ARTERY INTERVENTION: CATH118277

## 2018-05-04 LAB — CBC
HCT: 18.6 % — ABNORMAL LOW (ref 39.0–52.0)
HCT: 18.6 % — ABNORMAL LOW (ref 39.0–52.0)
Hemoglobin: 6.2 g/dL — ABNORMAL LOW (ref 13.0–17.0)
Hemoglobin: 6.1 g/dL — ABNORMAL LOW (ref 13.0–17.0)
MCH: 29.5 pg (ref 26.0–34.0)
MCH: 29.8 pg (ref 26.0–34.0)
MCHC: 33.3 g/dL (ref 30.0–36.0)
MCHC: 32.8 g/dL (ref 30.0–36.0)
MCV: 88.6 fL (ref 80.0–100.0)
MCV: 90.7 fL (ref 80.0–100.0)
Platelets: 289 10*3/uL (ref 150–400)
Platelets: 250 10*3/uL (ref 150–400)
RBC: 2.1 MIL/uL — ABNORMAL LOW (ref 4.22–5.81)
RBC: 2.05 MIL/uL — ABNORMAL LOW (ref 4.22–5.81)
RDW: 16.5 % — ABNORMAL HIGH (ref 11.5–15.5)
RDW: 16.1 % — ABNORMAL HIGH (ref 11.5–15.5)
WBC: 17 10*3/uL — ABNORMAL HIGH (ref 4.0–10.5)
WBC: 16.7 10*3/uL — ABNORMAL HIGH (ref 4.0–10.5)
nRBC: 1.6 % — ABNORMAL HIGH (ref 0.0–0.2)
nRBC: 2 % — ABNORMAL HIGH (ref 0.0–0.2)

## 2018-05-04 LAB — BASIC METABOLIC PANEL
Anion gap: 5 (ref 5–15)
Anion gap: 5 (ref 5–15)
BUN: 40 mg/dL — ABNORMAL HIGH (ref 8–23)
BUN: 34 mg/dL — ABNORMAL HIGH (ref 8–23)
CO2: 22 mmol/L (ref 22–32)
CO2: 18 mmol/L — ABNORMAL LOW (ref 22–32)
Calcium: 7 mg/dL — ABNORMAL LOW (ref 8.9–10.3)
Calcium: 6.8 mg/dL — ABNORMAL LOW (ref 8.9–10.3)
Chloride: 111 mmol/L (ref 98–111)
Chloride: 112 mmol/L — ABNORMAL HIGH (ref 98–111)
Creatinine, Ser: 2.1 mg/dL — ABNORMAL HIGH (ref 0.61–1.24)
Creatinine, Ser: 2.04 mg/dL — ABNORMAL HIGH (ref 0.61–1.24)
GFR calc Af Amer: 33 mL/min — ABNORMAL LOW (ref >60)
GFR calc Af Amer: 34 mL/min — ABNORMAL LOW (ref >60)
GFR calc non Af Amer: 28 mL/min — ABNORMAL LOW (ref >60)
GFR calc non Af Amer: 29 mL/min — ABNORMAL LOW (ref >60)
Glucose, Bld: 121 mg/dL — ABNORMAL HIGH (ref 70–99)
Glucose, Bld: 140 mg/dL — ABNORMAL HIGH (ref 70–99)
Potassium: 3.3 mmol/L — ABNORMAL LOW (ref 3.5–5.1)
Potassium: 4.4 mmol/L (ref 3.5–5.1)
Sodium: 138 mmol/L (ref 135–145)
Sodium: 135 mmol/L (ref 135–145)

## 2018-05-04 LAB — KOH PREP

## 2018-05-04 LAB — PATHOLOGIST SMEAR REVIEW

## 2018-05-04 LAB — PHOSPHORUS: Phosphorus: 2.7 mg/dL (ref 2.5–4.6)

## 2018-05-04 LAB — MAGNESIUM: Magnesium: 1.9 mg/dL (ref 1.7–2.4)

## 2018-05-04 LAB — PREPARE RBC (CROSSMATCH)

## 2018-05-04 SURGERY — VISCERAL ARTERY INTERVENTION
Anesthesia: Moderate Sedation

## 2018-05-04 MED ORDER — ENSURE ENLIVE PO LIQD
237.0000 mL | Freq: Three times a day (TID) | ORAL | Status: DC
  Administered 2018-05-04: 237 mL via ORAL

## 2018-05-04 MED ORDER — MIDAZOLAM HCL 5 MG/5ML IJ SOLN
INTRAMUSCULAR | Status: AC
  Filled 2018-05-04: qty 5

## 2018-05-04 MED ORDER — SODIUM CHLORIDE 0.9 % IV SOLN
INTRAVENOUS | Status: DC | PRN
  Administered 2018-05-04 (×2): 500 mL via INTRAVENOUS
  Administered 2018-05-11: 250 mL via INTRAVENOUS
  Administered 2018-05-14: 21:00:00 500 mL via INTRAVENOUS
  Administered 2018-05-15 – 2018-05-16 (×2): 250 mL via INTRAVENOUS

## 2018-05-04 MED ORDER — FENTANYL CITRATE (PF) 100 MCG/2ML IJ SOLN
12.5000 ug | Freq: Once | INTRAMUSCULAR | Status: AC
  Administered 2018-05-04: 12.5 ug via INTRAVENOUS
  Filled 2018-05-04: qty 2

## 2018-05-04 MED ORDER — CEFAZOLIN SODIUM-DEXTROSE 1-4 GM/50ML-% IV SOLN: 1.0000 g | Freq: Once | INTRAVENOUS | Status: DC

## 2018-05-04 MED ORDER — DEXMEDETOMIDINE HCL IN NACL 400 MCG/100ML IV SOLN
0.4000 ug/kg/h | INTRAVENOUS | Status: DC
  Administered 2018-05-04: 1 ug/kg/h via INTRAVENOUS
  Administered 2018-05-05: 0.6 ug/kg/h via INTRAVENOUS
  Administered 2018-05-05: 0.7 ug/kg/h via INTRAVENOUS
  Filled 2018-05-04 (×3): qty 100

## 2018-05-04 MED ORDER — MIDAZOLAM HCL 2 MG/2ML IJ SOLN
INTRAMUSCULAR | Status: DC | PRN
  Administered 2018-05-04 (×4): 1 mg via INTRAVENOUS

## 2018-05-04 MED ORDER — CALCIUM CARBONATE-VITAMIN D 500-200 MG-UNIT PO TABS
2.0000 | ORAL_TABLET | Freq: Every day | ORAL | Status: AC
  Administered 2018-05-04 – 2018-05-05 (×2): 2 via ORAL
  Filled 2018-05-04 (×2): qty 2

## 2018-05-04 MED ORDER — LACTATED RINGERS IV SOLN
INTRAVENOUS | Status: AC
  Administered 2018-05-04 – 2018-05-08 (×9): via INTRAVENOUS

## 2018-05-04 MED ORDER — FENTANYL CITRATE (PF) 100 MCG/2ML IJ SOLN
INTRAMUSCULAR | Status: AC
  Filled 2018-05-04: qty 2

## 2018-05-04 MED ORDER — SODIUM CHLORIDE 0.9 % IV SOLN
8.0000 mg/h | INTRAVENOUS | Status: AC
  Administered 2018-05-04 – 2018-05-06 (×6): 8 mg/h via INTRAVENOUS
  Administered 2018-05-07: 03:00:00 6.4 mg/h via INTRAVENOUS
  Filled 2018-05-04 (×6): qty 80

## 2018-05-04 MED ORDER — CEFAZOLIN SODIUM-DEXTROSE 2-4 GM/100ML-% IV SOLN
2.0000 g | Freq: Once | INTRAVENOUS | Status: AC
  Administered 2018-05-04: 2 g via INTRAVENOUS

## 2018-05-04 MED ORDER — FENTANYL CITRATE (PF) 100 MCG/2ML IJ SOLN
INTRAMUSCULAR | Status: DC | PRN
  Administered 2018-05-04 (×4): 25 ug via INTRAVENOUS

## 2018-05-04 MED ORDER — SODIUM CHLORIDE 0.9% IV SOLUTION: Freq: Once | INTRAVENOUS | Status: DC

## 2018-05-04 MED ORDER — CALCIUM GLUCONATE-NACL 1-0.675 GM/50ML-% IV SOLN
1.0000 g | Freq: Once | INTRAVENOUS | Status: AC
  Administered 2018-05-04: 1000 mg via INTRAVENOUS
  Filled 2018-05-04: qty 50

## 2018-05-04 MED ORDER — SODIUM BICARBONATE 8.4 % IV SOLN
100.0000 meq | Freq: Once | INTRAVENOUS | Status: AC
  Administered 2018-05-04: 100 meq via INTRAVENOUS
  Filled 2018-05-04: qty 50

## 2018-05-04 MED ORDER — FENTANYL CITRATE (PF) 100 MCG/2ML IJ SOLN
12.5000 ug | Freq: Once | INTRAMUSCULAR | Status: AC
  Administered 2018-05-04: 21:00:00 12.5 ug via INTRAVENOUS

## 2018-05-04 MED ORDER — HEPARIN (PORCINE) IN NACL 1000-0.9 UT/500ML-% IV SOLN
INTRAVENOUS | Status: AC
  Filled 2018-05-04: qty 1000

## 2018-05-04 MED ORDER — FIDAXOMICIN 200 MG PO TABS
200.0000 mg | ORAL_TABLET | Freq: Two times a day (BID) | ORAL | Status: DC
  Administered 2018-05-04: 13:00:00 200 mg via ORAL
  Filled 2018-05-04 (×2): qty 1

## 2018-05-04 MED ORDER — ONDANSETRON HCL 4 MG/2ML IJ SOLN
4.0000 mg | Freq: Four times a day (QID) | INTRAMUSCULAR | Status: DC | PRN
  Administered 2018-05-22: 07:00:00 4 mg via INTRAVENOUS
  Filled 2018-05-04 (×2): qty 2

## 2018-05-04 MED ORDER — SODIUM CHLORIDE 0.9 % IV SOLN
80.0000 mg | Freq: Once | INTRAVENOUS | Status: AC
  Administered 2018-05-04: 16:00:00 80 mg via INTRAVENOUS
  Filled 2018-05-04: qty 80

## 2018-05-04 MED ORDER — IOHEXOL 300 MG/ML  SOLN
INTRAMUSCULAR | Status: DC | PRN
  Administered 2018-05-04: 130 mL via INTRA_ARTERIAL

## 2018-05-04 MED ORDER — LIDOCAINE-EPINEPHRINE (PF) 1 %-1:200000 IJ SOLN
INTRAMUSCULAR | Status: AC
  Filled 2018-05-04: qty 30

## 2018-05-04 MED ORDER — POTASSIUM CHLORIDE 20 MEQ PO PACK
60.0000 meq | PACK | Freq: Once | ORAL | Status: AC
  Administered 2018-05-04: 60 meq via ORAL
  Filled 2018-05-04: qty 3

## 2018-05-04 MED ORDER — PANTOPRAZOLE SODIUM 40 MG IV SOLR
40.0000 mg | Freq: Two times a day (BID) | INTRAVENOUS | Status: DC
  Administered 2018-05-08 – 2018-05-10 (×7): 40 mg via INTRAVENOUS
  Filled 2018-05-04 (×7): qty 40

## 2018-05-04 MED ORDER — POTASSIUM CHLORIDE 10 MEQ/100ML IV SOLN
10.0000 meq | INTRAVENOUS | Status: AC
  Administered 2018-05-04 (×4): 10 meq via INTRAVENOUS
  Filled 2018-05-04 (×4): qty 100

## 2018-05-04 SURGICAL SUPPLY — 20 items
BLOCK BEAD 500-700 (Vascular Products) ×2 IMPLANT
CATH MICROCATH PRGRT 2.8F 110 (CATHETERS) ×1 IMPLANT
CATH PIG 70CM (CATHETERS) ×2 IMPLANT
CATH VS15FR (CATHETERS) ×2 IMPLANT
COIL 400 COMPLEX SOFT 4X6CM (Vascular Products) ×2 IMPLANT
COIL 400 COMPLEX STD 3X12CM (Vascular Products) ×2 IMPLANT
COIL 400 COMPLEX STD 4X15CM (Vascular Products) ×2 IMPLANT
COIL 400 COMPLEX STD 4X20CM (Vascular Products) ×2 IMPLANT
DEVICE OCCLUSION PODJ15 (Vascular Products) IMPLANT
DEVICE SAFEGUARD 24CM (GAUZE/BANDAGES/DRESSINGS) ×2 IMPLANT
DEVICE STARCLOSE SE CLOSURE (Vascular Products) ×2 IMPLANT
GLIDEWIRE ADV .035X180CM (WIRE) ×2 IMPLANT
HANDLE DETACHMENT COIL (MISCELLANEOUS) ×2 IMPLANT
MICROCATH PROGREAT 2.8F 110 CM (CATHETERS) ×2
OCCLUSION DEVICE PODJ15 (Vascular Products) IMPLANT
PACK ANGIOGRAPHY (CUSTOM PROCEDURE TRAY) ×2 IMPLANT
SHEATH BRITE TIP 5FRX11 (SHEATH) ×2 IMPLANT
SYR MEDRAD MARK 7 150ML (SYRINGE) ×2 IMPLANT
TUBING CONTRAST HIGH PRESS 72 (TUBING) ×2 IMPLANT
WIRE J 3MM .035X145CM (WIRE) ×2 IMPLANT

## 2018-05-04 NOTE — Op Note (Signed)
[facility] Percutaneous Study/Intervention Procedural Note     Surgeon(s): [employer]  Assistants: none  Pre-operative Diagnosis: 1.  Recurrent upper GI bleeding  Post-operative diagnosis: Same  Procedure(s) Performed: 1. Ultrasound guidance for vascular access right femoral artery 2. Catheter placement into celiac artery and then into the gastroduodenal artery and gastroduodenal artery branches including the pancreaticoduodenal artery 3. Aortogram and selective angiogram of the celiac artery and the gastroduodenal and pancreaticoduodenal arteries 4. Microbead embolization of the gastroduodenal artery with 1 cc of 500-700  polyvinyl alcohol beads.  5.  Coil embolization of the pancreaticoduodenal artery of the gastroduodenal artery as well as the main gastroduodenal artery back to its origin with a series of one 3 mm Ruby coil and three 4 mm Ruby coils 6. StarClose closure device right femoral artery  Anesthesia: Moderate conscious sedation for approximately 45 minutes using 4 mg of Versed and 100 mcg of Fentanyl  EBL: 10 cc  Fluoro Time: 11.2 minutes  Contrast: 130 cc  Indications: Patient is a 84 y.o.male with brisk recurrent upper GI bleeding.  He is already had endoscopic treatment that initially worked but he has had recurrent bleeding today.  Surgery has seen the patient but was interested in a less invasive option for treatment. The patient is brought in for angiography for further evaluation and potential treatment. Risks and benefits are discussed and informed consent is obtained  Procedure: The patient was identified and appropriate procedural time out was performed. The patient was then placed supine on the table and prepped and draped in the usual sterile fashion.Moderate conscious sedation was administered during a face to face encounter with the  patient throughout the procedure with my supervision of the RN administering medicines and monitoring the patient's vital signs, pulse oximetry, telemetry and mental status throughout from the start of the procedure until the patient was taken to the recovery room.  Ultrasound was used to evaluate the right common femoral artery. It was patent . A digital ultrasound image was acquired. A Seldinger needle was used to access the right common femoral artery under direct ultrasound guidance and a permanent image was performed. A 0.035 J wire was advanced without resistance and a 5Fr sheath was placed. Pigtail catheter was placed into the aorta and an AP aortogram was performed. This demonstrated the aorta and renal arteries were patent.  The SMA and celiac artery had flow although their origins cannot be well seen.  We transitioned to the lateral projection to cannulate the celiac artery and this was done after a lateral aortogram showed no origin stenosis of the celiac or superior mesenteric artery. A V S1 catheter was used to selectively cannulate the celiac artery. Selective cannulation of the celiac artery including advancement into the gastroduodenal and pancreaticoduodenal branches demonstrated what appeared to be a blush of the gastroduodenal artery.  This was very hard to see due to continuous patient motion throughout the entire procedure. Based on his continued bleeding and the nuclear medicine study I elected to treat this area with embolization. I initially advanced the Pro-Great microcatheter out the celiac artery and then down into the gastroduodenal artery and instilled approximately 1 cc of 500 to 700 m polyvinyl alcohol beads in this location. Angiogram following this showed the main vessels to be open with less brisk filling. I knew this would not be definitive treatment with arterial blood gastroduodenal artery.  I got well beyond the gastroduodenal artery and out the pancreaticoduodenal artery  branch and began coil  embolization.  For the smaller branch a 3 mm diameter 12 cm length standard Ruby coil was deployed that went back to the end of the gastroduodenal artery.  A 4 mm diameter by 6 cm soft coil was then deployed in the gastroduodenal artery followed by 4 mm x 20 and a 4 mm x 15 standard that went very near the origin of the gastroduodenal artery off of the common hepatic artery.  Selective imaging both in the origin of the gastroduodenal artery with a prograde microcatheter as well as with the V S1 catheter in the celiac artery showed no significant flow through the gastroduodenal artery at this point.  I elected to terminate the procedure. The diagnostic catheter was removed. StarClose closure device was deployed in usual fashion with excellent hemostatic result. The patient was taken to the recovery room in stable condition having tolerated the procedure well.     Findings:Aorta and renal arteries without significant stenosis.  The origins of the celiac and SMA had no significant stenosis. Selective cannulation of the celiac artery including advancement into the gastroduodenal and pancreaticoduodenal branches demonstrated what appeared to be a blush of the gastroduodenal artery.  This was very hard to see due to continuous patient motion throughout the entire procedure.  Disposition: Patient was taken to the recovery room in stable condition having tolerated the procedure well.  Complications: None  Leotis Pain 05/04/2018 6:00 PM   This note was created with Dragon Medical transcription system. Any errors in dictation are purely unintentional.

## 2018-05-04 NOTE — Progress Notes (Addendum)
GI Inpatient Follow-up Note  Subjective:  Patient seen in follow up for symptomatic anemia. He had EGD performed yesterday by Dr. Alice Reichert showing normal esophagus, normal stomach, and partially obstructing oozing duodenal ulcer with visible vessel with no evidence of perforation. Hemostasis was obtained using 10 Fr Gold probe. Per nursing, no acute events overnight. Hemoglobin this morning was 6.2 and he received 1 unit. Repeat labs are scheduled for this afternoon. He reportedly had appx 200 cc bright red blood liquid stool this afternoon at 1300. Previous BM this morning was soft and green. He reports his abdominal pain is better today than it was yesterday. He has been tolerating broth without difficulty.   Scheduled Inpatient Medications:  . sodium chloride   Intravenous Once  . calcium-vitamin D  2 tablet Oral Q breakfast  . fidaxomicin  200 mg Oral BID  . pantoprazole (PROTONIX) IV  40 mg Intravenous Q12H    Continuous Inpatient Infusions:   . sodium chloride Stopped (05/04/18 1020)  . lactated ringers 125 mL/hr at 05/04/18 1214  . metronidazole Stopped (05/04/18 0558)  . potassium chloride 10 mEq (05/04/18 1215)    PRN Inpatient Medications:  sodium chloride, ALPRAZolam, docusate sodium  Review of Systems: Constitutional: Weight is stable.  Eyes: No changes in vision. ENT: No oral lesions, sore throat.  GI: see HPI.  Heme/Lymph: No easy bruising.  CV: No chest pain.  GU: No hematuria.  Integumentary: No rashes.  Neuro: No headaches.  Psych: No depression/anxiety.  Endocrine: No heat/cold intolerance.  Allergic/Immunologic: No urticaria.  Resp: No cough, SOB.  Musculoskeletal: No joint swelling.    Physical Examination: BP (!) 111/55   Pulse 74   Temp 97.8 F (36.6 C)   Resp (!) 21   Ht 5\' 5"  (1.651 m)   Wt 56.1 kg   SpO2 98%   BMI 20.58 kg/m  Gen: Arousable and answers questions HEENT: PEERLA, EOMI, Neck: supple, no JVD or thyromegaly Chest: CTA  bilaterally, no wheezes, crackles, or other adventitious sounds CV: RRR, no m/g/c/r Abd: soft, nontender to light and deep palpation in epigastric, RUQ, RLQ, LUQ, and LLQ, ND, +BS in all four quadrants; no HSM, guarding, ridigity, or rebound tenderness Ext: no edema, well perfused with 2+ pulses, Skin: no rash or lesions noted Lymph: no LAD  Data: Lab Results  Component Value Date   WBC 17.0 (H) 05/04/2018   HGB 6.2 (L) 05/04/2018   HCT 18.6 (L) 05/04/2018   MCV 88.6 05/04/2018   PLT 289 05/04/2018   Recent Labs  Lab 05/03/18 1045 05/03/18 2008 05/04/18 0546  HGB 7.7* 7.2* 6.2*   Lab Results  Component Value Date   NA 138 05/04/2018   K 3.3 (L) 05/04/2018   CL 111 05/04/2018   CO2 22 05/04/2018   BUN 40 (H) 05/04/2018   CREATININE 2.10 (H) 05/04/2018   Lab Results  Component Value Date   ALT 16 05/02/2018   AST 31 05/02/2018   ALKPHOS 130 (H) 05/02/2018   BILITOT 0.6 05/02/2018   Recent Labs  Lab 05/02/18 0653  INR 1.1   Assessment/Plan:  1. GI Bleeding secondary to oozing duodenal ulcer treated with bipolar cautery  2. Clostridium Dificile Colitis - Treatment per Dr. Marthann Schiller.  3. Chronic pain syndrome with family reportng history of narcotic abuse.  4. History of alcohol abuse.   5. DNR status - Patient and son still would like full medical care, procedures and evaluation short of CPR and ventilator support.   6. Sepsis  with hypotension. Currently BP is stable with no hypotension   Recommendations:  - Hemoglobin 6.2 this morning. He is s/p 1 unit blood this morning - Per nursing, had 200cc bright red liquid stool around 1300 today. Advise to continue to monitor closely. Repeat hemoglobin as scheduled 1400 today. Will place surgery consult just in case. Possible recurrent duodenal ulcer - Continue serial H&H.  - Surgery consult, consider vascular consult as well per surgery recs - Continue full liquid diet for now but might need to be made NPO -  Continue IV acid suppression  - We will continue to follow along closely   Please call with questions or concerns.   Octavia Bruckner, PA-C [facility] [phone] (Cell)

## 2018-05-04 NOTE — Progress Notes (Signed)
Shift summary: patient s/p EGD with embolization 4/26. Remains in SDU. HGB low this AM, received 1 unit PRBC. Flexi-seal placed due to loose, dark stools. HGB again dropped even after transfusion. Output from flexi increased. Patient ultimately went down for vascular surgical intervention. Sitter remains at bedside.

## 2018-05-04 NOTE — Progress Notes (Signed)
OT Cancellation Note  Patient Details Name: Justin Andrews MRN: [id number] DOB: November 12, 1934   Cancelled Treatment:    Reason Eval/Treat Not Completed: Medical issues which prohibited therapy(Hgb: 6.2, elevatated troponin. Will continue to monitor, and intervene when medically appropriate.)  Harrel Carina, MS, OTR/L 05/04/2018, 1:06 PM

## 2018-05-04 NOTE — Progress Notes (Signed)
CRITICAL CARE NOTE      CHIEF COMPLAINT:   Acute blood loss anemia   SUBJECTIVE FINDINGS & SIGNIFICANT EVENTS   S/p 1 unit PRBC this am repeat h/h pending, cdiff + , CTH+lacunar infarct.  Patient remains critically ill, had massive bloody BM again today afternoon 2units prbc and 1FFP -today 2pm Severe protein calorie malnutrition    PAST MEDICAL HISTORY   Past Medical History:  Diagnosis Date  . C. difficile diarrhea 11/2017  . Elevated serum creatinine   . History of melanoma   . Hypertension   . Osteoarthritis   . Tobacco abuse      SURGICAL HISTORY   Past Surgical History:  Procedure Laterality Date  . ANKLE FRACTURE SURGERY    . ESOPHAGOGASTRODUODENOSCOPY (EGD) WITH PROPOFOL N/A 05/03/2018   Procedure: ESOPHAGOGASTRODUODENOSCOPY (EGD) WITH PROPOFOL;  Surgeon: Toledo, Benay Pike, MD;  Location: [facility] ENDOSCOPY;  Service: Gastroenterology;  Laterality: N/A;  . EXTERNAL EAR SURGERY    . INTRAMEDULLARY (IM) NAIL INTERTROCHANTERIC Left 12/01/2017   Procedure: INTRAMEDULLARY (IM) NAIL INTERTROCHANTRIC;  Surgeon: Thornton Park, MD;  Location: [facility] ORS;  Service: Orthopedics;  Laterality: Left;  . ROTATOR CUFF REPAIR    . TONSILLECTOMY    . TYMPANOPLASTY W/ MASTOIDECTOMY     Patient with reports of mastoid surgery (appears to have had surgery on TM; Left).     FAMILY HISTORY   Family History  Problem Relation Age of Onset  . Prostate cancer Neg Hx   . Bladder Cancer Neg Hx   . Kidney cancer Neg Hx      SOCIAL HISTORY   Social History   Tobacco Use  . Smoking status: Current Every Day Smoker    Packs/day: 1.00    Years: 70.00    Pack years: 70.00    Types: Cigarettes  . Smokeless tobacco: Never Used  Substance Use Topics  . Alcohol use: No  . Drug use: No     MEDICATIONS    Current Medication:  Current Facility-Administered Medications:  .  0.9 %  sodium chloride infusion (Manually program via Guardrails IV Fluids), , Intravenous, Once, Tukov-Yual, Magdalene S, NP .  0.9 %  sodium chloride infusion, , Intravenous, PRN, Ottie Glazier, MD, Stopped at 05/04/18 1020 .  ALPRAZolam (XANAX) tablet 0.5 mg, 0.5 mg, Oral, QHS PRN, Tukov-Yual, Magdalene S, NP, 0.5 mg at 05/03/18 1934 .  calcium-vitamin D (OSCAL WITH D) 500-200 MG-UNIT per tablet 2 tablet, 2 tablet, Oral, Q breakfast, Tukov-Yual, Magdalene S, NP, 2 tablet at 05/04/18 0744 .  dextrose 5 %-0.45 % sodium chloride infusion, , Intravenous, Continuous, Tukov-Yual, Magdalene S, NP, Last Rate: 125 mL/hr at 05/04/18 1100 .  docusate sodium (COLACE) capsule 100 mg, 100 mg, Oral, BID PRN, Vaughan Basta, MD .  metroNIDAZOLE (FLAGYL) IVPB 500 mg, 500 mg, Intravenous, Q8H, Tukov-Yual, Magdalene S, NP, Stopped at 05/04/18 0558 .  pantoprazole (PROTONIX) injection 40 mg, 40 mg, Intravenous, Q12H, Vaughan Basta, MD, 40 mg at 05/04/18 0911 .  vancomycin (VANCOCIN) 50 mg/mL oral solution 500 mg, 500 mg, Oral, Q6H, Kasa, Kurian, MD, 500 mg at 05/04/18 0500    ALLERGIES   Aspirin    REVIEW OF SYSTEMS     10 point ROS is negative except as per subjective findings  PHYSICAL EXAMINATION   Vitals:   05/04/18 0950 05/04/18 1100  BP:  (!) 111/55  Pulse: 80 74  Resp: (!) 26 (!) 22  Temp:    SpO2: 98% 99%    GENERAL: Mild  distress due to acute hematochezia HEAD: Normocephalic, atraumatic.  EYES: Pupils equal, round, reactive to light.  No scleral icterus.  MOUTH: Moist mucosal membrane. NECK: Supple. No thyromegaly. No nodules. No JVD.  PULMONARY: Clear to auscultation bilaterally CARDIOVASCULAR: S1 and S2. Regular rate and rhythm. No murmurs, rubs, or gallops.  GASTROINTESTINAL: Soft, nontender, non-distended. No masses. Positive bowel sounds. No hepatosplenomegaly.  MUSCULOSKELETAL: No  swelling, clubbing, or edema.  NEUROLOGIC: Mild distress due to acute illness SKIN:intact,warm,dry   LABS AND IMAGING     -I personally reviewed most recent blood work, imaging and microbiology - significant findings today are hypokalemia, AKI stage II, mild hyperglycemia, severe anemia,  LAB RESULTS: Recent Labs  Lab 05/03/18 0627 05/03/18 2008 05/04/18 0546  NA 140 134* 138  K 3.5 3.2* 3.3*  CL 113* 109 111  CO2 20* 17* 22  BUN 70* 51* 40*  CREATININE 2.76* 2.34* 2.10*  GLUCOSE 133* 174* 121*   Recent Labs  Lab 05/03/18 0627 05/03/18 1045 05/03/18 2008 05/04/18 0546  HGB 7.7* 7.7* 7.2* 6.2*  HCT 22.3*  --  21.4* 18.6*  WBC 23.3*  --  22.3* 17.0*  PLT 322  --  337 289       IMAGING RESULTS: No results found.    ASSESSMENT AND PLAN    -Multidisciplinary rounds held today  Acute blood loss anemia    -Status post GI evaluation with EGD - difficulty with hemostasis requiring multiple attempts with injection of epinephrine followed by bipolar cautery x2  -s/p additional large volume bloody BM post EGD - surgery consulted - 2units PRBc 1FFP pending for transfusion .  -appreciate GI recommendations    Septic shock    Due to infectious diarreah - Norovirus +, cdiff negative toxin  -supportive care  -will stop cdiff targeted antibiotics -appreciate pharmD   CARDIAC FAILURE- due to hypovolemic shock secondary to above     Now resolved  -oxygen as needed -follow up cardiac enzymes as indicated ICU monitoring    Renal Failure-acute on chronic renal failure stage 3  -follow chem 7 -follow UO -continue Foley Catheter-assess need daily   ID -continue IV abx as prescibed -follow up cultures  GI/Nutrition GI PROPHYLAXIS as indicated DIET-->TF's as tolerated Constipation protocol as indicated  ENDO - ICU hypoglycemic\Hyperglycemia protocol -check FSBS per protocol   ELECTROLYTES -follow labs as needed -replace as needed -pharmacy  consultation   DVT/GI PRX ordered -SCDs  TRANSFUSIONS AS NEEDED MONITOR FSBS ASSESS the need for LABS as needed   Critical care provider statement:    Critical care time (minutes):  39   Critical care time was exclusive of:  Separately billable procedures and treating other patients   Critical care was necessary to treat or prevent imminent or life-threatening deterioration of the following conditions:  Circulatory shock due to hypovolemic shock and septic shock from acute massive GI blood loss and colitis of viral etiology   Critical care was time spent personally by me on the following activities:  Development of treatment plan with patient or surrogate, discussions with consultants, evaluation of patient's response to treatment, examination of patient, obtaining history from patient or surrogate, ordering and performing treatments and interventions, ordering and review of laboratory studies and re-evaluation of patient's condition.  I assumed direction of critical care for this patient from another provider in my specialty: no    This document was prepared using Dragon voice recognition software and may include unintentional dictation errors.    Ottie Glazier, M.D.  Division  of Pulmonary & Encinal

## 2018-05-04 NOTE — Plan of Care (Signed)

## 2018-05-04 NOTE — Progress Notes (Signed)
Initial Nutrition Assessment   DOCUMENTATION CODES:   Not applicable  INTERVENTION:  Provide Ensure Enlive po TID, each supplement provides 350 kcal and 20 grams of protein.  Monitor magnesium, potassium, and phosphorus daily for at least 3 days, MD to replete as needed, as pt is at risk for refeeding syndrome.  NUTRITION DIAGNOSIS:   Predicted suboptimal nutrient intake related to (hx EtOH abuse, concern for self-neglect) as evidenced by (per chart, slow weight loss from UBW of 150 lbs since 2016).  GOAL:   Patient will meet greater than or equal to 90% of their needs  MONITOR:   PO intake, Supplement acceptance, Diet advancement, Labs, Weight trends, Skin, I & O's  REASON FOR ASSESSMENT:   Malnutrition Screening Tool    ASSESSMENT:   84 year old male with PMHx of HTN, OA, tobacco abuse, EtOH abuse admitted with GI bleed secondary to oozing duodenal ulcer s/p bipolar cautery on 4/26, C Diff colitis, hemorrhagic shock.   Unable to enter room per policy to preserve PPE as patient is on enteric precautions. Attempted to speak with patient from the door but he was asleep and could not provide any history. Patient appears to have severe muscle wasting of clavicles and temporalis, but unable to confirm without completing full NFPE. Diet was advanced to full liquids this morning after breakfast. He tolerated 3 bowls of broth for breakfast this morning. Per chart weight has been trending down. He was 59.9 kg on 01/16/2018. Currently 56.1 kg (123.68 lbs). He has lost 3.8 kg (6.3% body weight) over the past 3 months, which is not significant for time frame. It appears weight loss has been slower over time. He used to weigh around 150 lbs in 2016. Per chart patient lives alone and there is concern for self-neglect.  Medications reviewed and include: Oscal with D 2 tablets daily, pantoprazole, LR @ 125 mL/hr.  Labs reviewed: Potassium 3.3, BUN 40, Creatinine 2.1.  RD suspects patient is  severely malnourished, but unable to confirm without completing NFPE.  NUTRITION - FOCUSED PHYSICAL EXAM:  Unable to complete at this time.  Diet Order:   Diet Order            Diet full liquid Room service appropriate? Yes; Fluid consistency: Thin  Diet effective now             EDUCATION NEEDS:   Not appropriate for education at this time  Skin:  Skin Assessment: Skin Integrity Issues:(MSAD to groin and buttocks; ecchymosis)  Last BM:  05/04/2018 - large type 7; rectal tube placed 4/27  Height:   Ht Readings from Last 1 Encounters:  05/03/18 5\' 5"  (1.651 m)   Weight:   Wt Readings from Last 1 Encounters:  05/03/18 56.1 kg   Ideal Body Weight:  61.8 kg  BMI:  Body mass index is 20.58 kg/m.  Estimated Nutritional Needs:   Kcal:  1500-1700  Protein:  80-90 grams  Fluid:  1.5-1.7 L/day  Willey Blade, MS, RD, LDN Office: [phone] Pager: [phone] After Hours/Weekend Pager: [phone]

## 2018-05-04 NOTE — Progress Notes (Signed)
PT Cancellation Note  Patient Details Name: Justin Andrews MRN: [id number] DOB: 1934/07/20   Cancelled Treatment:    Reason Eval/Treat Not Completed: Medical issues which prohibited therapy(Most recent Hb in 6s. Will wait until pt is medically approprite and attempt PT evlauation again at that time/date. )  1:29 PM, 05/04/18 Etta Grandchild, PT, DPT Physical Therapist - [facility]  [phone] ([place])    [place] C 05/04/2018, 1:29 PM

## 2018-05-04 NOTE — Progress Notes (Signed)
Pt. tx to ICU#17; recovered last 10 min. In ICU with previous RN Aleene Davidson. Pt. Restless, will not keep legs straight. Pt. Yells out at random "just take me now." "I can't do this." . Right groin intact with PAD without any complications at site. At present, pt. Not following commands. Hand off given to [place], [place] [facility].

## 2018-05-04 NOTE — Anesthesia Postprocedure Evaluation (Signed)
Anesthesia Post Note  Patient: Justin Andrews  Procedure(s) Performed: ESOPHAGOGASTRODUODENOSCOPY (EGD) WITH PROPOFOL (N/A )  Patient location during evaluation: Endoscopy Anesthesia Type: General Level of consciousness: awake and alert Pain management: pain level controlled Vital Signs Assessment: post-procedure vital signs reviewed and stable Respiratory status: spontaneous breathing, nonlabored ventilation, respiratory function stable and patient connected to nasal cannula oxygen Cardiovascular status: blood pressure returned to baseline and stable Postop Assessment: no apparent nausea or vomiting Anesthetic complications: no     Last Vitals:  Vitals:   05/04/18 0700 05/04/18 0800  BP: (!) 103/48 (!) 145/64  Pulse: 77 84  Resp: 15 (!) 31  Temp:  36.7 C  SpO2: 100% 100%    Last Pain:  Vitals:   05/04/18 0800  TempSrc: Oral  PainSc: 0-No pain                 Martha Clan

## 2018-05-04 NOTE — Consult Note (Signed)
[facility] Vascular Consult Note  MRN : [id number]  Justin Andrews is a 84 y.o. (19-Nov-1934) male who presents with chief complaint of  Chief Complaint  Patient presents with  . Fall  . Cold Exposure  .  History of Present Illness: I am asked to see the patient by Dr. Hampton Abbot for consideration for embolization for recurrent upper GI bleeding.  The patient was admitted 2 days prior with diarrhea and was found to have C. difficile colitis.  He has been started on appropriate therapy for this.  He began having brisk upper GI bleeding was seen by gastroenterology.  Yesterday, he underwent endoscopy with endoscopic treatment of a duodenal ulcer in the second portion of the duodenum with visible bleeding vessel.  This initially seemed to stop the bleeding and he was doing much better.  This morning he had resume diet and had no other signs of bleeding.  However, he began noticing blood per rectum and increasing tachycardia as the day went on.  This was followed by drop in his hemoglobin and he was found to have a hemoglobin of 6.1 this afternoon.  Recurrent upper GI bleeding was present and he has been started on transfusions.  Dr. Hampton Abbot and Dr. Alice Reichert discussed the case and then Dr. Hampton Abbot contacted me less than an hour ago for consideration for embolization.  No previous history of bleeding from the GI tract to his knowledge.  The patient is not a great historian.  Although he is not in extremis, he is clearly unwell.  Current Facility-Administered Medications  Medication Dose Route Frequency Provider Last Rate Last Dose  . [MAR Hold] 0.9 %  sodium chloride infusion (Manually program via Guardrails IV Fluids)   Intravenous Once Tukov-Yual, Magdalene S, NP      . Doug Sou Hold] 0.9 %  sodium chloride infusion   Intravenous PRN Ottie Glazier, MD 10 mL/hr at 05/04/18 1600    . [MAR Hold] ALPRAZolam (XANAX) tablet 0.5 mg  0.5 mg Oral QHS PRN Tukov-Yual, Magdalene S, NP   0.5 mg at  05/03/18 1934  . [MAR Hold] calcium gluconate 1 g/ 50 mL sodium chloride IVPB  1 g Intravenous Once Piscoya, Jose, MD      . Doug Sou Hold] calcium-vitamin D (OSCAL WITH D) 500-200 MG-UNIT per tablet 2 tablet  2 tablet Oral Q breakfast Tukov-Yual, Magdalene S, NP   2 tablet at 05/04/18 0744  . [MAR Hold] docusate sodium (COLACE) capsule 100 mg  100 mg Oral BID PRN Vaughan Basta, MD      . Doug Sou Hold] feeding supplement (ENSURE ENLIVE) (ENSURE ENLIVE) liquid 237 mL  237 mL Oral TID BM Aleskerov, Fuad, MD      . fentaNYL (SUBLIMAZE) 100 MCG/2ML injection           . Heparin (Porcine) in NaCl 1000-0.9 UT/500ML-% SOLN           . lactated ringers infusion   Intravenous Continuous Piscoya, Jose, MD 100 mL/hr at 05/04/18 1600    . lidocaine-EPINEPHrine (XYLOCAINE-EPINEPHrine) 1 %-1:200000 (PF) injection           . midazolam (VERSED) 5 MG/5ML injection           . pantoprazole (PROTONIX) 80 mg in sodium chloride 0.9 % 250 mL (0.32 mg/mL) infusion  8 mg/hr Intravenous Continuous Piscoya, Jose, MD      . Doug Sou Hold] pantoprazole (PROTONIX) injection 40 mg  40 mg Intravenous Q12H Olean Ree, MD  Past Medical History:  Diagnosis Date  . C. difficile diarrhea 11/2017  . Elevated serum creatinine   . History of melanoma   . Hypertension   . Osteoarthritis   . Tobacco abuse     Past Surgical History:  Procedure Laterality Date  . ANKLE FRACTURE SURGERY    . ESOPHAGOGASTRODUODENOSCOPY (EGD) WITH PROPOFOL N/A 05/03/2018   Procedure: ESOPHAGOGASTRODUODENOSCOPY (EGD) WITH PROPOFOL;  Surgeon: Toledo, Benay Pike, MD;  Location: [facility] ENDOSCOPY;  Service: Gastroenterology;  Laterality: N/A;  . EXTERNAL EAR SURGERY    . INTRAMEDULLARY (IM) NAIL INTERTROCHANTERIC Left 12/01/2017   Procedure: INTRAMEDULLARY (IM) NAIL INTERTROCHANTRIC;  Surgeon: Thornton Park, MD;  Location: [facility] ORS;  Service: Orthopedics;  Laterality: Left;  . ROTATOR CUFF REPAIR    . TONSILLECTOMY    . TYMPANOPLASTY W/  MASTOIDECTOMY     Patient with reports of mastoid surgery (appears to have had surgery on TM; Left).    Social History Social History   Tobacco Use  . Smoking status: Current Every Day Smoker    Packs/day: 1.00    Years: 70.00    Pack years: 70.00    Types: Cigarettes  . Smokeless tobacco: Never Used  Substance Use Topics  . Alcohol use: No  . Drug use: No    Family History Family History  Problem Relation Age of Onset  . Prostate cancer Neg Hx   . Bladder Cancer Neg Hx   . Kidney cancer Neg Hx   no bleeding disorders, clotting disorders, or aneuerysms  Allergies  Allergen Reactions  . Aspirin Other (See Comments)    bleeding     REVIEW OF SYSTEMS (Negative unless checked)  Constitutional: [] Weight loss  [] Fever  [] Chills Cardiac: [] Chest pain   [] Chest pressure   [] Palpitations   [] Shortness of breath when laying flat   [] Shortness of breath at rest   [x] Shortness of breath with exertion. Vascular:  [] Pain in legs with walking   [] Pain in legs at rest   [] Pain in legs when laying flat   [] Claudication   [] Pain in feet when walking  [] Pain in feet at rest  [] Pain in feet when laying flat   [] History of DVT   [] Phlebitis   [] Swelling in legs   [] Varicose veins   [] Non-healing ulcers Pulmonary:   [] Uses home oxygen   [] Productive cough   [] Hemoptysis   [] Wheeze  [] COPD   [] Asthma Neurologic:  [] Dizziness  [] Blackouts   [] Seizures   [] History of stroke   [] History of TIA  [] Aphasia   [] Temporary blindness   [] Dysphagia   [] Weakness or numbness in arms   [] Weakness or numbness in legs Musculoskeletal:  [x] Arthritis   [] Joint swelling   [] Joint pain   [] Low back pain Hematologic:  [] Easy bruising  [] Easy bleeding   [] Hypercoagulable state   [x] Anemic  [] Hepatitis Gastrointestinal:  [x] Blood in stool   [x] Vomiting blood  [x] Gastroesophageal reflux/heartburn   [] Difficulty swallowing. Genitourinary:  [x] Chronic kidney disease   [] Difficult urination  [] Frequent urination   [] Burning with urination   [] Blood in urine Skin:  [] Rashes   [] Ulcers   [] Wounds Psychological:  [] History of anxiety   []  History of major depression.  Physical Examination  Vitals:   05/04/18 1600 05/04/18 1615 05/04/18 1630 05/04/18 1641  BP: (!) 112/49 (!) 118/52 92/63 109/63  Pulse: 91 81  89  Resp: (!) 22 17 18 19   Temp: 98 F (36.7 C)   98.2 F (36.8 C)  TempSrc: Oral   Oral  SpO2:  100% 100%  100%  Weight:      Height:       Body mass index is 20.58 kg/m. Gen:  WD/WN. Pale.  Head: [place]/AT, No temporalis wasting.  Ear/Nose/Throat: Hearing grossly intact, nares w/o erythema or drainage, oropharynx w/o Erythema/Exudate Eyes: Sclera non-icteric, conjunctiva clear Neck: Trachea midline.  No JVD.  Pulmonary:  Good air movement, respirations not labored, equal bilaterally.  Cardiac: slightly tachycardic but regular Vascular:  Vessel Right Left  Radial Palpable Palpable                                   Gastrointestinal: soft, non-tender/non-distended.  Musculoskeletal: M/S 5/5 throughout.  Extremities without ischemic changes.  No deformity or atrophy. No edema. Neurologic: Sensation grossly intact in extremities.  Symmetrical.  Speech is fluent. Motor exam as listed above. Psychiatric: Judgment intact, Mood & affect appropriate for pt's clinical situation. Dermatologic: No rashes or ulcers noted.  No cellulitis or open wounds.       CBC Lab Results  Component Value Date   WBC 16.7 (H) 05/04/2018   HGB 6.1 (L) 05/04/2018   HCT 18.6 (L) 05/04/2018   MCV 90.7 05/04/2018   PLT 250 05/04/2018    BMET    Component Value Date/Time   NA 135 05/04/2018 1419   K 4.4 05/04/2018 1419   CL 112 (H) 05/04/2018 1419   CO2 18 (L) 05/04/2018 1419   GLUCOSE 140 (H) 05/04/2018 1419   BUN 34 (H) 05/04/2018 1419   CREATININE 2.04 (H) 05/04/2018 1419   CREATININE 2.08 (H) 02/13/2018 1518   CALCIUM 6.8 (L) 05/04/2018 1419   GFRNONAA 29 (L) 05/04/2018 1419   GFRAA 34  (L) 05/04/2018 1419   Estimated Creatinine Clearance: 22.2 mL/min (A) (by C-G formula based on SCr of 2.04 mg/dL (H)).  COAG Lab Results  Component Value Date   INR 1.1 05/02/2018   INR 1.14 11/30/2017    Radiology Ct Abdomen Pelvis Wo Contrast  Result Date: 05/02/2018 CLINICAL DATA:  84 year old male found down covered in his own feces. EXAM: CT ABDOMEN AND PELVIS WITHOUT CONTRAST TECHNIQUE: Multidetector CT imaging of the abdomen and pelvis was performed following the standard protocol without IV contrast. COMPARISON:  CT the abdomen and pelvis 11/30/2017. FINDINGS: Lower chest: Atherosclerotic calcifications in the thoracic aorta as well as the right coronary artery. Diffuse low-attenuation throughout the intravascular compartment, suggestive of anemia. Hepatobiliary: No definite suspicious cystic or solid hepatic lesions are confidently identified on today's noncontrast CT examination. Unenhanced appearance of the gallbladder is unremarkable. Pancreas: No definite pancreatic mass or peripancreatic fluid or inflammatory changes identified on today's noncontrast CT examination. Spleen: Unremarkable. Adrenals/Urinary Tract: Mild bilateral renal atrophy. Low-attenuation lesions in the left kidney, incompletely characterized on today's non-contrast CT examination, but likely to represent cysts, measuring up to 2.5 cm in the upper pole. Bilateral adrenal glands are normal in appearance. No hydroureteronephrosis. Urinary bladder is normal in appearance. Stomach/Bowel: Normal appearance of the stomach. No pathologic dilatation of small bowel or colon. Rectal temperature probe in place. Normal appendix. Vascular/Lymphatic: Aortic atherosclerosis. No lymphadenopathy noted in the abdomen or pelvis. Reproductive: Prostate gland and seminal vesicles are unremarkable in appearance. Other: No significant volume of ascites.  No pneumoperitoneum. Musculoskeletal: Orthopedic fixation hardware in the left proximal  femur incidentally noted. There are no aggressive appearing lytic or blastic lesions noted in the visualized portions of the skeleton. IMPRESSION: 1. No acute findings  are noted in the abdomen or pelvis. 2. Aortic atherosclerosis, in addition to at least right coronary artery disease. 3. Low-attenuation throughout the intravascular compartment, suggestive of anemia. 4. Additional incidental findings, as above. Electronically Signed   By: Vinnie Langton M.D.   On: 05/02/2018 08:31   Dg Chest 1 View  Result Date: 05/02/2018 CLINICAL DATA:  Patient hypothermic. Headache. EXAM: CHEST  1 VIEW COMPARISON:  11/30/2017. FINDINGS: Heart is enlarged. Calcified tortuous aorta. Multiple healed rib fractures. No consolidation or edema. Mild interstitial prominence likely related to chronic tobacco abuse. IMPRESSION: Cardiomegaly. No active disease. Electronically Signed   By: Staci Righter M.D.   On: 05/02/2018 07:12   Ct Head Wo Contrast  Result Date: 05/02/2018 CLINICAL DATA:  Patient found lying on floor known feces. Patient is hypothermic from head to toe. No obvious trauma. EXAM: CT HEAD WITHOUT CONTRAST TECHNIQUE: Contiguous axial images were obtained from the base of the skull through the vertex without intravenous contrast. COMPARISON:  November 30, 2016 FINDINGS: Brain: No subdural, epidural, or subarachnoid hemorrhage. Brainstem, basal cisterns, and cerebellum are unchanged. Probable small lacunar infarct just to the left of midline in the cerebellum on axial image 9, unchanged since November 2011. Ventricles and sulci are prominent but stable. Chronic white matter changes are again identified. A small lacunar infarct in the posterior limb of the left internal capsule on axial image 19 is slightly more conspicuous in the interval. No acute cortical ischemia or infarct noted. No mass effect or midline shift. Vascular: No hyperdense vessel or unexpected calcification. Skull: Normal. Negative for fracture or  focal lesion. Sinuses/Orbits: Mucosal thickening in the right maxillary sinus. Paranasal sinuses otherwise normal. The right mastoid air cells and right middle ear are well aerated. Patient is status post left mastoidectomy with chronically opacified middle ear. Other: None. IMPRESSION: 1. A lacunar infarct in the posterior limb of the left internal capsule is more conspicuous in the interval. The difference could be due to difference in slice selection. No other acute intracranial abnormalities. Electronically Signed   By: Dorise Bullion III M.D   On: 05/02/2018 08:36   Nm Gi Blood Loss  Result Date: 05/02/2018 CLINICAL DATA:  84 year old with anemia (requiring transfusion) and multiple bloody stools throughout the day today. EXAM: NUCLEAR MEDICINE GASTROINTESTINAL BLEEDING SCAN TECHNIQUE: Sequential abdominal images were obtained following intravenous administration of Tc-32m labeled red blood cells. RADIOPHARMACEUTICALS:  Twenty-two mCi Tc-17m pertechnetate in-vitro labeled red cells. COMPARISON:  No prior nuclear imaging. CT abdomen and pelvis performed earlier same day is correlated. FINDINGS: Imaging was performed out to 1 hour. At this point, the patient did not wish to continue with further imaging. Faint abnormal activity is visible in the midline of the pelvis, initially seen on the 41-45 minute image. This is SUPERIOR to the activity within the urinary bladder. The expected activity in the blood pool, liver, spleen and stomach is present. IMPRESSION: Possible active bleeding from the rectum. However, no abnormality is seen in the rectum on the CT earlier today. Electronically Signed   By: Evangeline Dakin M.D.   On: 05/02/2018 18:50   Dg Hip Unilat W Or W/o Pelvis 2-3 Views Right  Result Date: 04/08/2018 CLINICAL DATA:  Right hip pain after injury. EXAM: DG HIP (WITH OR WITHOUT PELVIS) 2-3V RIGHT COMPARISON:  None. FINDINGS: There is no evidence of hip fracture or dislocation. There is no evidence  of arthropathy or other focal bone abnormality. IMPRESSION: No significant abnormality seen in the right hip. Electronically  Signed   By: Marijo Conception, M.D.   On: 04/08/2018 15:23   Dg Femur 1v Right  Result Date: 04/09/2018 CLINICAL DATA:  Fall with right leg pain. EXAM: RIGHT FEMUR 1 VIEW COMPARISON:  None. FINDINGS: No visible fracture or bony lesion. Moderate osteoarthritis at the level of the hip and knee joints. Soft tissues are unremarkable. There is some scattered calcified plaque in the superficial femoral artery. IMPRESSION: No acute fracture of the right femur identified. Electronically Signed   By: Aletta Edouard M.D.   On: 04/09/2018 08:21      Assessment/Plan 1. Recurrent upper GI bleeding.  Has had endoscopic treatment with initial success, but now with recurrent bleeding.  Although surgery remains an option for treatment, an attempt at embolization would offer an attempt at a less invasive option.  The success for embolization for upper GI bleeding is not as high as it is for lower GI/diverticular bleeding, but for bleeding duodenal ulcer, embolization of the GDA is a reasonable option before surgery.  Risks and benefits are discussed and he is agreeable to proceed. 2. C. Diff colitis.  On appropriate treatment.  Appropriate contact precautions.   3. CKD.  Small risk of contrast nephropathy. Will continue to hydrate the patient and limit contrast as much as possible.   4. HTN. Stable on outpatient medications.  BP on the low side now with bleeding.   Leotis Pain, MD  05/04/2018 4:51 PM    This note was created with Dragon medical transcription system.  Any error is purely unintentional

## 2018-05-04 NOTE — Consult Note (Signed)
Date of Consultation:  05/04/2018  Requesting Physician:  Olean Ree, MD  Reason for Consultation:  GI bleeding  History of Present Illness: Justin Andrews is a 84 y.o. male admitted on 4/25 with hemorrhagic shock from upper GI bleeding, with initial admission Hgb of 3.2  He was transfused in the ED and admitted to ICU.  Had total 4 units pRBC transfused on admission.  Had initial lactic acidosis of > 11 which resolved with pRBC and hydration, and troponin of 0.07, with WBC of 21.4 which has also improved since.  Yesterday, he underwent EGD with epinephrine injection as well as bipolar cautery of the bleeding with Dr. Alice Reichert.  His Hgb this morning was down to 6.2 from 7.2 and had bloody BM this morning.  However, he was given a full liquid diet and per his RN, he had breakfast on the chair today.  He was transfused 1 unit pRBC this mornign and his Hgb decreased to 6.1 this afternoon.  Per his RN, he is less responsive this afternoon compared to this morning.  There is new order for transfusion of 2 unit pRBC and 1 unit FFP.  Surgery was consulted for evaluation in case he needs laparotomy.    The patient has remained normotensive and with normal heart rate throughout, with good O2 saturation on room air.  Has not needed pressors.  I am able to carry some conversation with him.  He denies any prior abdominal surgeries, any prior bleeding episodes or ulcers.  Denies any dizziness, lightheadedness, chest pain, shortness of breath, nausea, or abdominal pain, but unclear how accurate this is.  Past Medical History: Past Medical History:  Diagnosis Date  . C. difficile diarrhea 11/2017  . Elevated serum creatinine   . History of melanoma   . Hypertension   . Osteoarthritis   . Tobacco abuse      Past Surgical History: Past Surgical History:  Procedure Laterality Date  . ANKLE FRACTURE SURGERY    . ESOPHAGOGASTRODUODENOSCOPY (EGD) WITH PROPOFOL N/A 05/03/2018   Procedure:  ESOPHAGOGASTRODUODENOSCOPY (EGD) WITH PROPOFOL;  Surgeon: Toledo, Benay Pike, MD;  Location: [facility] ENDOSCOPY;  Service: Gastroenterology;  Laterality: N/A;  . EXTERNAL EAR SURGERY    . INTRAMEDULLARY (IM) NAIL INTERTROCHANTERIC Left 12/01/2017   Procedure: INTRAMEDULLARY (IM) NAIL INTERTROCHANTRIC;  Surgeon: Thornton Park, MD;  Location: [facility] ORS;  Service: Orthopedics;  Laterality: Left;  . ROTATOR CUFF REPAIR    . TONSILLECTOMY    . TYMPANOPLASTY W/ MASTOIDECTOMY     Patient with reports of mastoid surgery (appears to have had surgery on TM; Left).    Home Medications: Prior to Admission medications   Medication Sig Start Date End Date Taking? Authorizing Provider  amLODipine (NORVASC) 10 MG tablet Take 1 tablet (10 mg total) by mouth daily. 09/24/17  Yes Leone Haven, MD  calcium-vitamin D (OSCAL WITH D) 500-200 MG-UNIT tablet Take 1 tablet by mouth 2 (two) times daily. 12/03/17  Yes Fritzi Mandes, MD  carvedilol (COREG) 6.25 MG tablet Take 2 tablets (12.5 mg total) by mouth 2 (two) times daily with a meal. 12/03/17  Yes Fritzi Mandes, MD  [employer] VITAMIN B12 1000 MCG tablet TAKE 1 TABLET BY MOUTH EVERY DAY 11/19/17  Yes Leone Haven, MD  diclofenac sodium (VOLTAREN) 1 % GEL Apply 2 g topically 4 (four) times daily as needed (left hip pain). 03/26/18  Yes Leone Haven, MD  ferrous sulfate 325 (65 FE) MG tablet Take 1 tablet (325 mg total) by mouth  daily with breakfast. 04/23/18  Yes Leone Haven, MD  folic acid (FOLVITE) 1 MG tablet TAKE 1 TABLET BY MOUTH EVERY DAY 02/05/17  Yes Leone Haven, MD  gabapentin (NEURONTIN) 100 MG capsule Take 2 capsules (200 mg total) by mouth 3 (three) times daily. 12/03/17  Yes Fritzi Mandes, MD  Multiple Vitamin (MULTIVITAMIN WITH MINERALS) TABS tablet Take 1 tablet by mouth daily. 12/03/17  Yes Fritzi Mandes, MD  ranitidine (ZANTAC) 150 MG tablet Take 150 mg by mouth 2 (two) times daily.   Yes [provider]  tamsulosin (FLOMAX) 0.4  MG CAPS capsule Take 1 capsule (0.4 mg total) by mouth daily. 10/25/17  Yes Leone Haven, MD  thiamine 100 MG tablet Take 1 tablet (100 mg total) by mouth daily. 12/03/17  Yes Fritzi Mandes, MD  traMADol (ULTRAM) 50 MG tablet Take 1 tablet (50 mg total) by mouth every 8 (eight) hours as needed. 04/27/18  Yes Leone Haven, MD  venlafaxine XR (EFFEXOR-XR) 37.5 MG 24 hr capsule Take 1 tablet by mouth once daily for 7 days starting on 04/01/18, then take 2 tablets by mouth once daily 03/26/18  Yes Leone Haven, MD  vitamin C (VITAMIN C) 250 MG tablet Take 1 tablet (250 mg total) by mouth 2 (two) times daily. 12/03/17  Yes Fritzi Mandes, MD  feeding supplement, ENSURE ENLIVE, (ENSURE ENLIVE) LIQD Take 237 mLs by mouth 2 (two) times daily between meals. 12/03/17   Fritzi Mandes, MD    Allergies: Allergies  Allergen Reactions  . Aspirin Other (See Comments)    bleeding    Social History:  reports that he has been smoking cigarettes. He has a 70.00 pack-year smoking history. He has never used smokeless tobacco. He reports that he does not drink alcohol or use drugs.   Family History: Family History  Problem Relation Age of Onset  . Prostate cancer Neg Hx   . Bladder Cancer Neg Hx   . Kidney cancer Neg Hx     Review of Systems: Review of Systems  Unable to perform ROS: Mental acuity    Physical Exam BP (!) 117/55   Pulse 87   Temp 97.8 F (36.6 C) (Oral)   Resp 18   Ht 5\' 5"  (1.651 m)   Wt 56.1 kg   SpO2 100%   BMI 20.58 kg/m  CONSTITUTIONAL: No acute distress HEENT:  Normocephalic, atraumatic, extraocular motion intact. NECK: Trachea is midline, and there is no jugular venous distension. RESPIRATORY:  Lungs are clear, and breath sounds are equal bilaterally. Normal respiratory effort without pathologic use of accessory muscles. CARDIOVASCULAR: Heart is regular without murmurs, gallops, or rubs. GI: The abdomen is soft, non-distended, non-tender to palpation.  Has  rectal tube in place with dark bloody liquid stool.  GU: foley catheter in place with yellow clear urine MUSCULOSKELETAL:  Normal muscle strength and tone in all four extremities.  No peripheral edema or cyanosis. SKIN: Skin turgor is normal. There are no pathologic skin lesions.  NEUROLOGIC:  Motor and sensation is grossly normal.  Cranial nerves are grossly intact.   Laboratory Analysis: Results for orders placed or performed during the hospital encounter of 05/02/18 (from the past 24 hour(s))  KOH prep     Status: None   Collection Time: 05/03/18  6:16 PM  Result Value Ref Range   Specimen Description BRONCHIAL ALVEOLAR LAVAGE    Special Requests NONE    KOH Prep      YEAST PRESENT Performed at  [facility], [address] [place], [place] [address]    Report Status 05/04/2018 FINAL   CBC     Status: Abnormal   Collection Time: 05/03/18  8:08 PM  Result Value Ref Range   WBC 22.3 (H) 4.0 - 10.5 K/uL   RBC 2.41 (L) 4.22 - 5.81 MIL/uL   Hemoglobin 7.2 (L) 13.0 - 17.0 g/dL   HCT 21.4 (L) 39.0 - 52.0 %   MCV 88.8 80.0 - 100.0 fL   MCH 29.9 26.0 - 34.0 pg   MCHC 33.6 30.0 - 36.0 g/dL   RDW 16.8 (H) 11.5 - 15.5 %   Platelets 337 150 - 400 K/uL   nRBC 2.6 (H) 0.0 - 0.2 %  Basic metabolic panel     Status: Abnormal   Collection Time: 05/03/18  8:08 PM  Result Value Ref Range   Sodium 134 (L) 135 - 145 mmol/L   Potassium 3.2 (L) 3.5 - 5.1 mmol/L   Chloride 109 98 - 111 mmol/L   CO2 17 (L) 22 - 32 mmol/L   Glucose, Bld 174 (H) 70 - 99 mg/dL   BUN 51 (H) 8 - 23 mg/dL   Creatinine, Ser 2.34 (H) 0.61 - 1.24 mg/dL   Calcium 6.9 (L) 8.9 - 10.3 mg/dL   GFR calc non Af Amer 25 (L) >60 mL/min   GFR calc Af Amer 29 (L) >60 mL/min   Anion gap 8 5 - 15  Magnesium     Status: None   Collection Time: 05/03/18  8:08 PM  Result Value Ref Range   Magnesium 2.0 1.7 - 2.4 mg/dL  Phosphorus     Status: None   Collection Time: 05/03/18  8:08 PM  Result Value Ref Range    Phosphorus 3.1 2.5 - 4.6 mg/dL  CBC     Status: Abnormal   Collection Time: 05/04/18  5:46 AM  Result Value Ref Range   WBC 17.0 (H) 4.0 - 10.5 K/uL   RBC 2.10 (L) 4.22 - 5.81 MIL/uL   Hemoglobin 6.2 (L) 13.0 - 17.0 g/dL   HCT 18.6 (L) 39.0 - 52.0 %   MCV 88.6 80.0 - 100.0 fL   MCH 29.5 26.0 - 34.0 pg   MCHC 33.3 30.0 - 36.0 g/dL   RDW 16.5 (H) 11.5 - 15.5 %   Platelets 289 150 - 400 K/uL   nRBC 1.6 (H) 0.0 - 0.2 %  Basic metabolic panel     Status: Abnormal   Collection Time: 05/04/18  5:46 AM  Result Value Ref Range   Sodium 138 135 - 145 mmol/L   Potassium 3.3 (L) 3.5 - 5.1 mmol/L   Chloride 111 98 - 111 mmol/L   CO2 22 22 - 32 mmol/L   Glucose, Bld 121 (H) 70 - 99 mg/dL   BUN 40 (H) 8 - 23 mg/dL   Creatinine, Ser 2.10 (H) 0.61 - 1.24 mg/dL   Calcium 7.0 (L) 8.9 - 10.3 mg/dL   GFR calc non Af Amer 28 (L) >60 mL/min   GFR calc Af Amer 33 (L) >60 mL/min   Anion gap 5 5 - 15  Magnesium     Status: None   Collection Time: 05/04/18  5:46 AM  Result Value Ref Range   Magnesium 1.9 1.7 - 2.4 mg/dL  Phosphorus     Status: None   Collection Time: 05/04/18  5:46 AM  Result Value Ref Range   Phosphorus 2.7 2.5 - 4.6 mg/dL  Prepare RBC  Status: None   Collection Time: 05/04/18  7:00 AM  Result Value Ref Range   Order Confirmation      ORDER PROCESSED BY BLOOD BANK Performed at [facility], [address] [place], [place] [address]   BLOOD TRANSFUSION REPORT - SCANNED     Status: None   Collection Time: 05/04/18 10:42 AM   Narrative   Ordered by an unspecified provider.  CBC     Status: Abnormal   Collection Time: 05/04/18  2:19 PM  Result Value Ref Range   WBC 16.7 (H) 4.0 - 10.5 K/uL   RBC 2.05 (L) 4.22 - 5.81 MIL/uL   Hemoglobin 6.1 (L) 13.0 - 17.0 g/dL   HCT 18.6 (L) 39.0 - 52.0 %   MCV 90.7 80.0 - 100.0 fL   MCH 29.8 26.0 - 34.0 pg   MCHC 32.8 30.0 - 36.0 g/dL   RDW 16.1 (H) 11.5 - 15.5 %   Platelets 250 150 - 400 K/uL   nRBC 2.0 (H) 0.0 - 0.2 %   Basic metabolic panel     Status: Abnormal   Collection Time: 05/04/18  2:19 PM  Result Value Ref Range   Sodium 135 135 - 145 mmol/L   Potassium 4.4 3.5 - 5.1 mmol/L   Chloride 112 (H) 98 - 111 mmol/L   CO2 18 (L) 22 - 32 mmol/L   Glucose, Bld 140 (H) 70 - 99 mg/dL   BUN 34 (H) 8 - 23 mg/dL   Creatinine, Ser 2.04 (H) 0.61 - 1.24 mg/dL   Calcium 6.8 (L) 8.9 - 10.3 mg/dL   GFR calc non Af Amer 29 (L) >60 mL/min   GFR calc Af Amer 34 (L) >60 mL/min   Anion gap 5 5 - 15  Prepare RBC     Status: None   Collection Time: 05/04/18  3:22 PM  Result Value Ref Range   Order Confirmation      ORDER PROCESSED BY BLOOD BANK Performed at [facility], [address] [place], [place] [address]     Imaging: No results found.  Assessment and Plan: This is a 84 y.o. male with bleeding duodenal ulcer.  Discussed with the patient that despite of the procedure yesterday by Dr. Alice Reichert, it appears that he is bleeding again as his Hgb has not responded to 1 unit transfusion and he is having dark bloody output from his rectal tube.  He has new order to tranfuse 2 units pRBC as well as 1 unit FFP.  I have made him NPO and started him on a Protonix infusion.  Given his multiple pRBC transfusions, have also added order for calcium gluconate for hypocalcemia.  I have discussed with Dr. Lucky Cowboy for possible embolization and he will proceed with that today.  Discussed with the patient that he may need surgery for his bleeding duodenal ulcer, but first will attempt less invasive methods.  Have discussed with Dr. Alice Reichert and the patient's son Fredrico Beedle, and he's in agreement with the current plan and potential need for surgical intervention.  Face-to-face time spent with the patient and care providers was 80 minutes, with more than 50% of the time spent counseling, educating, and coordinating care of the patient.     Melvyn Neth, MD [facility] Pg:  [phone]

## 2018-05-05 DIAGNOSIS — Z9889 Other specified postprocedural states: Secondary | ICD-10-CM

## 2018-05-05 LAB — TYPE AND SCREEN
ABO/RH(D): A POS
Antibody Screen: NEGATIVE
Unit division: 0
Unit division: 0
Unit division: 0
Unit division: 0
Unit division: 0
Unit division: 0
Unit division: 0

## 2018-05-05 LAB — BPAM RBC
Blood Product Expiration Date: 202004282359
Blood Product Expiration Date: 202004282359
Blood Product Expiration Date: 202004302359
Blood Product Expiration Date: 202005012359
Blood Product Expiration Date: 202005012359
Blood Product Expiration Date: 202005022359
Blood Product Expiration Date: 202005032359
ISSUE DATE / TIME: 202004250754
ISSUE DATE / TIME: 202004250754
ISSUE DATE / TIME: 202004251051
ISSUE DATE / TIME: 202004251512
ISSUE DATE / TIME: 202004270847
ISSUE DATE / TIME: 202004271536
ISSUE DATE / TIME: 202004271845
Unit Type and Rh: 5100
Unit Type and Rh: 5100
Unit Type and Rh: 5100
Unit Type and Rh: 5100
Unit Type and Rh: 6200
Unit Type and Rh: 6200
Unit Type and Rh: 6200

## 2018-05-05 LAB — BPAM FFP
Blood Product Expiration Date: 202005022359
ISSUE DATE / TIME: 202004272153
Unit Type and Rh: 6200

## 2018-05-05 LAB — BASIC METABOLIC PANEL
Anion gap: 4 — ABNORMAL LOW (ref 5–15)
BUN: 37 mg/dL — ABNORMAL HIGH (ref 8–23)
CO2: 20 mmol/L — ABNORMAL LOW (ref 22–32)
Calcium: 7.3 mg/dL — ABNORMAL LOW (ref 8.9–10.3)
Chloride: 112 mmol/L — ABNORMAL HIGH (ref 98–111)
Creatinine, Ser: 1.95 mg/dL — ABNORMAL HIGH (ref 0.61–1.24)
GFR calc Af Amer: 36 mL/min — ABNORMAL LOW (ref >60)
GFR calc non Af Amer: 31 mL/min — ABNORMAL LOW (ref >60)
Glucose, Bld: 125 mg/dL — ABNORMAL HIGH (ref 70–99)
Potassium: 4.8 mmol/L (ref 3.5–5.1)
Sodium: 136 mmol/L (ref 135–145)

## 2018-05-05 LAB — MAGNESIUM: Magnesium: 1.9 mg/dL (ref 1.7–2.4)

## 2018-05-05 LAB — CBC
HCT: 22.9 % — ABNORMAL LOW (ref 39.0–52.0)
Hemoglobin: 7.7 g/dL — ABNORMAL LOW (ref 13.0–17.0)
MCH: 29.2 pg (ref 26.0–34.0)
MCHC: 33.6 g/dL (ref 30.0–36.0)
MCV: 86.7 fL (ref 80.0–100.0)
Platelets: 206 10*3/uL (ref 150–400)
RBC: 2.64 MIL/uL — ABNORMAL LOW (ref 4.22–5.81)
RDW: 16 % — ABNORMAL HIGH (ref 11.5–15.5)
WBC: 15.7 10*3/uL — ABNORMAL HIGH (ref 4.0–10.5)
nRBC: 2.7 % — ABNORMAL HIGH (ref 0.0–0.2)

## 2018-05-05 LAB — HEMOGLOBIN AND HEMATOCRIT, BLOOD
HCT: 27.7 % — ABNORMAL LOW (ref 39.0–52.0)
HCT: 29.7 % — ABNORMAL LOW (ref 39.0–52.0)
Hemoglobin: 9.5 g/dL — ABNORMAL LOW (ref 13.0–17.0)
Hemoglobin: 9.9 g/dL — ABNORMAL LOW (ref 13.0–17.0)

## 2018-05-05 LAB — PREPARE FRESH FROZEN PLASMA

## 2018-05-05 MED ORDER — MORPHINE SULFATE (PF) 2 MG/ML IV SOLN
INTRAVENOUS | Status: AC
  Administered 2018-05-05: 1 mg via INTRAVENOUS
  Filled 2018-05-05: qty 1

## 2018-05-05 MED ORDER — HYDROMORPHONE HCL 1 MG/ML IJ SOLN
1.0000 mg | INTRAMUSCULAR | Status: DC | PRN
  Administered 2018-05-05 – 2018-05-08 (×8): 1 mg via INTRAVENOUS
  Filled 2018-05-05 (×8): qty 1

## 2018-05-05 MED ORDER — MORPHINE SULFATE (PF) 2 MG/ML IV SOLN
1.0000 mg | INTRAVENOUS | Status: DC | PRN
  Administered 2018-05-05 – 2018-05-08 (×6): 1 mg via INTRAVENOUS
  Filled 2018-05-05 (×5): qty 1

## 2018-05-05 NOTE — Progress Notes (Signed)
PT Cancellation Note  Patient Details Name: Justin Andrews MRN: [id number] DOB: 07/12/1934   Cancelled Treatment:    Reason Eval/Treat Not Completed: Patient's level of consciousness(CHart reviewed, discussed with RN: pt was wth AMS earlier today, agitated requiring medication for anxiolysis. Pt now somnolent, with sitter in room. WIll hold for now, attempt at later date/time when pt is able to participate. )  1:48 PM, 05/05/18 Etta Grandchild, PT, DPT Physical Therapist - [facility]  [phone] ([place])    Harless Molinari C 05/05/2018, 1:48 PM

## 2018-05-05 NOTE — NC FL2 (Signed)
[place] LEVEL OF CARE SCREENING TOOL     IDENTIFICATION  Patient Name: Justin Andrews Birthdate: November 28, 1934 Sex: male Admission Date (Current Location): 05/02/2018  [place] and [place] Number:  [place] and Address:  [facility], [address] [place], [place] [address]      Provider Number: [id number]  Attending Physician Name and Address:  Vaughan Basta, *  Relative Name and Phone Number:  Maccoy, [place]   [phone]     Current Level of Care: Hospital Recommended Level of Care: [place] Prior Approval Number:    Date Approved/Denied:   PASRR Number: [id number]  Discharge Plan: SNF    Current Diagnoses: Patient Active Problem List   Diagnosis Date Noted  . GI bleed 05/02/2018  . Hemorrhagic shock ([place]) 05/02/2018  . Altered mental status 05/02/2018  . Anemia due to blood loss, acute 05/02/2018  . RLS (restless legs syndrome) 04/22/2018  . Wrist pain 04/22/2018  . Right hip pain 04/08/2018  . Otitis media 03/19/2018  . Suicidal ideation 03/19/2018  . Protein-calorie malnutrition, severe 12/03/2017  . Closed left hip fracture ([place]) 11/30/2017  . Diarrhea 11/15/2017  . Duodenum disorder 11/15/2017  . Frequency of urination 10/21/2017  . Cigarette smoker 10/06/2017  . Cough 09/25/2017  . Changes in vision 09/25/2017  . Proteinuria 09/25/2017  . Basal cell carcinoma 02/28/2017  . 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 DOE (dyspnea on exertion) 01/17/2017  . Chronic right upper quadrant pain 11/15/2016  . Balance problem 11/15/2016  . Simple renal cyst 10/21/2016  . Prostate nodule 10/16/2016  . Dysuria 10/16/2016  . CKD (chronic kidney disease) stage 3, GFR 30-59 ml/min (HCC) 08/05/2016  . Anemia 08/05/2016  . Anxiety and depression 08/05/2016  . Weight loss 08/05/2016  . Sleep disturbance 07/23/2016  . Hypertension   . Osteoarthritis   . Tobacco abuse   .  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[place]) 03/06/2016    Orientation RESPIRATION BLADDER Height & Weight     Self  Normal Incontinent Weight: 123 lb 10.9 oz (56.1 kg) Height:  5\' 5"  (165.1 cm)  BEHAVIORAL SYMPTOMS/MOOD NEUROLOGICAL BOWEL NUTRITION STATUS  (none) (none) Incontinent    AMBULATORY STATUS COMMUNICATION OF NEEDS Skin       Normal                       Personal Care Assistance Level of Assistance              Functional Limitations Info  Sight, Hearing, Speech Sight Info: Adequate Hearing Info: Impaired Speech Info: Adequate    SPECIAL CARE FACTORS FREQUENCY  PT (By licensed PT)     PT Frequency: 5 X week              Contractures Contractures Info: Present    Additional Factors Info  Code Status Code Status Info: DNR             Current Medications (05/05/2018):  This is the current hospital active medication list Current Facility-Administered Medications  Medication Dose Route Frequency Provider Last Rate Last Dose  . 0.9 %  sodium chloride infusion (Manually program via Guardrails IV Fluids)   Intravenous Once Algernon Huxley, MD      . 0.9 %  sodium chloride infusion   Intravenous PRN Algernon Huxley, MD   Stopped at 05/04/18 2017  . ALPRAZolam (XANAX) tablet 0.5 mg  0.5 mg Oral QHS PRN  Algernon Huxley, MD   0.5 mg at 05/04/18 1947  . dexmedetomidine (PRECEDEX) 400 MCG/100ML (4 mcg/mL) infusion  0.4-1.2 mcg/kg/hr Intravenous Titrated Awilda Bill, NP   Stopped at 05/05/18 [phone]  . docusate sodium (COLACE) capsule 100 mg  100 mg Oral BID PRN Algernon Huxley, MD      . feeding supplement (ENSURE ENLIVE) (ENSURE ENLIVE) liquid 237 mL  237 mL Oral TID BM Algernon Huxley, MD   237 mL at 05/04/18 2040  . lactated ringers infusion   Intravenous Continuous Algernon Huxley, MD 10 mL/hr at 05/05/18 0500    . morphine 2 MG/ML injection 1 mg  1 mg Intravenous Q3H PRN Ottie Glazier, MD   1 mg at 05/05/18 1140  . ondansetron (ZOFRAN) injection 4 mg  4 mg Intravenous Q6H  PRN Awilda Bill, NP      . pantoprazole (PROTONIX) 80 mg in sodium chloride 0.9 % 250 mL (0.32 mg/mL) infusion  8 mg/hr Intravenous Continuous Algernon Huxley, MD 25 mL/hr at 05/05/18 0500 8 mg/hr at 05/05/18 0500  . [START ON 05/08/2018] pantoprazole (PROTONIX) injection 40 mg  40 mg Intravenous Q12H Algernon Huxley, MD         Discharge Medications: Please see discharge summary for a list of discharge medications.  Relevant Imaging Results:  Relevant Lab Results:   Additional Information ssn [id number]  Shela Leff, LCSW

## 2018-05-05 NOTE — TOC Transition Note (Addendum)
Transition of Care [facility]) - CM/SW Discharge Note   Patient Details  Name: REICHEN HUTZLER MRN: [id number] Date of Birth: 1934/09/26  Transition of [facility]) CM/SW Contact:  Shela Leff, LCSW Phone Number: 05/05/2018, 2:22 PM   Clinical Narrative:   Patient has altered mental status and has a sitter in the room. DSS APS is following. Caseworker is Laurence Slate: [phone].PT attempted to see patient today but was unable to work with patient due to his mental status. As consult was stating patient is aware of placement need and self neglect, CSW will prepare for a bed search in case this is what is indicated for discharge. Shela Leff MSW,LCSW [phone]                                      Discharge Plan and Services                                     Social Determinants of Health (SDOH) Interventions     Readmission Risk Interventions No flowsheet data found.

## 2018-05-05 NOTE — Progress Notes (Signed)
OT Cancellation Note  Patient Details Name: Justin Andrews MRN: [id number] DOB: 04-27-1934   Cancelled Treatment:    Reason Eval/Treat Not Completed: Patient's level of consciousness. Pt somnolent/lethargic, sitter in room. Unable to participate in therapy at this time. Will re-attempt at later date/time as pt is medically appropriate.   Jeni Salles, MPH, MS, OTR/L ascom [phone] 05/05/18, 3:12 PM

## 2018-05-05 NOTE — Progress Notes (Addendum)
[place] [facility] [place] NAME: Justin Andrews    MR#:  [id number]  DATE OF BIRTH:  [birth year]  SUBJECTIVE:  CHIEF COMPLAINT:   Chief Complaint  Patient presents with  . Fall  . Cold Exposure   Appears stable, no complaint, has some confusion.  Continue to have some dark bowel movement overnight.  Hemoglobin dropped, called surgical consult as advised by GI.  They are suggesting vascular consult.  Embolization was done which seems to be helping the bleed.  Hemoglobin is stable.  REVIEW OF SYSTEMS:  CONSTITUTIONAL: No fever, fatigue or weakness.  EYES: No blurred or double vision.  EARS, NOSE, AND THROAT: No tinnitus or ear pain.  RESPIRATORY: No cough, shortness of breath, wheezing or hemoptysis.  CARDIOVASCULAR: No chest pain, orthopnea, edema.  GASTROINTESTINAL: No nausea, vomiting, diarrhea or abdominal pain.  GENITOURINARY: No dysuria, hematuria.  ENDOCRINE: No polyuria, nocturia,  HEMATOLOGY: No anemia, easy bruising or bleeding SKIN: No rash or lesion. MUSCULOSKELETAL: No joint pain or arthritis.   NEUROLOGIC: No tingling, numbness, weakness.  PSYCHIATRY: No anxiety or depression.   ROS  DRUG ALLERGIES:   Allergies  Allergen Reactions  . Aspirin Other (See Comments)    bleeding    VITALS:  Blood pressure (!) 157/65, pulse 80, temperature 97.7 F (36.5 C), temperature source Axillary, resp. rate 19, height 5\' 5"  (1.651 m), weight 56.1 kg, SpO2 99 %.  PHYSICAL EXAMINATION:   GENERAL:  84 y.o.-year-old patient lying in the bed with critical appearance. EYES: Pupils equal, round, reactive to light and accommodation. No scleral icterus. Extraocular muscles intact.  Conjunctive are pale. HEENT: Head atraumatic, normocephalic. Oropharynx and nasopharynx clear.  NECK:  Supple, no jugular venous distention. No thyroid enlargement, no tenderness.  LUNGS: Normal breath sounds bilaterally, no wheezing, rales,rhonchi or crepitation. No use  of accessory muscles of respiration.  CARDIOVASCULAR: S1, S2 normal. No murmurs, rubs, or gallops.  ABDOMEN: Soft, nontender, nondistended. Bowel sounds present. No organomegaly or mass.  Bleeding per rectum present. EXTREMITIES: No pedal edema, cyanosis, or clubbing.  NEUROLOGIC: Cranial nerves II through XII are intact. Muscle strength 3-4/5 in all extremities. Sensation intact. Gait not checked.  PSYCHIATRIC: The patient is alert and oriented x 1.  SKIN: No obvious rash, lesion, or ulcer.   Physical Exam LABORATORY PANEL:   CBC Recent Labs  Lab 05/05/18 0345 05/05/18 1219  WBC 15.7*  --   HGB 7.7* 9.5*  HCT 22.9* 27.7*  PLT 206  --    ------------------------------------------------------------------------------------------------------------------  Chemistries  Recent Labs  Lab 05/02/18 0653  05/05/18 0345  NA 140   < > 136  K 4.9   < > 4.8  CL 103   < > 112*  CO2 13*   < > 20*  GLUCOSE 195*   < > 125*  BUN 81*   < > 37*  CREATININE 3.25*   < > 1.95*  CALCIUM 8.7*   < > 7.3*  MG  --    < > 1.9  AST 31  --   --   ALT 16  --   --   ALKPHOS 130*  --   --   BILITOT 0.6  --   --    < > = values in this interval not displayed.   ------------------------------------------------------------------------------------------------------------------  Cardiac Enzymes Recent Labs  Lab 05/02/18 0653 05/02/18 1956  TROPONINI 0.07* 0.05*   ------------------------------------------------------------------------------------------------------------------  RADIOLOGY:  No results found.  ASSESSMENT AND PLAN:  Principal Problem:   GI bleed Active Problems:   Hemorrhagic shock (HCC)   Altered mental status   Anemia due to blood loss, acute  *Hemorrhagic shock Acute blood loss anemia GI bleed Hypothermia  After 4 units of transfusion hemoglobin stabilized, actively bleeding. GI physician is contacted by ER, monitor in stepdown unit. Keep n.p.o. and continue IV  fluid. Continue IV PPI twice daily Blood pressure is stable with IV fluid and blood transfusion. GI had suggested to do nuclear medicine bleeding scan which showed some active bleed in the rectal area. EGD was done which showed some bleed from duodenal area.  Patient still had some bleed so surgical consult was called in the suggesting vascular consult and trial of embolization.  Status post embolization.  Hemoglobin is stable.  *Acute renal failure on CKD stage III This could be due to acute blood loss. I am not sure about NSAIDs use. Continue to monitor with IV fluids.  Electrolytes are currently under control   *Hypertension We will hold medication with presentation of shock.  *History of alcoholism as per son Continue vitamin supplements.  *C. difficile colitis - diarrhea Patient had history of C. difficile in recent past. WBC count is high currently. Checked positive for C. difficile, started on oral Vanco and IV Flagyl.   All the records are reviewed and case discussed with Care Management/Social Workerr. Management plans discussed with the patient, family and they are in agreement.  CODE STATUS: DNR  TOTAL TIME TAKING CARE OF THIS PATIENT: 35 minutes.    POSSIBLE D/C IN 1-2 DAYS, DEPENDING ON CLINICAL CONDITION.   Vaughan Basta M.D on 05/05/2018   Between 7am to 6pm - Pager - [phone]  After 6pm go to [email or web address] - password [email or web address] [facility]  Office  [phone]  CC: Primary care physician; Leone Haven, MD  Note: This dictation was prepared with Dragon dictation along with smaller phrase technology. Any transcriptional errors that result from this process are unintentional.

## 2018-05-05 NOTE — Progress Notes (Signed)
PHARMACY CONSULT NOTE - FOLLOW UP  Pharmacy Consult for Electrolyte Monitoring and Replacement   Recent Labs: Potassium (mmol/L)  Date Value  05/05/2018 4.8   Magnesium (mg/dL)  Date Value  05/05/2018 1.9   Calcium (mg/dL)  Date Value  05/05/2018 7.3 (L)   Albumin (g/dL)  Date Value  05/02/2018 2.7 (L)   Phosphorus (mg/dL)  Date Value  05/04/2018 2.7   Sodium (mmol/L)  Date Value  05/05/2018 136   Corrected Ca = 8.3  Assessment: 84 yo male admitted with acute blood loss anemia.  Goal of Therapy:  Potassium ~4.0, Magnesium ~2.0  Plan:  Patient is receiving LR @ 100 mL/hr Electrolytes WNL, no need for replacement. Will order electrolytes with AM labs.   Pharmacy will continue to monitor   Paticia Stack, PharmD Pharmacy Resident  05/05/2018 3:20 PM

## 2018-05-05 NOTE — Progress Notes (Signed)
CRITICAL CARE NOTE      CHIEF COMPLAINT:   Acute blood loss anemia   SUBJECTIVE FINDINGS & SIGNIFICANT EVENTS    Continues to have abdominal pain refractory to IV morphine will now give IV dilaudid  H/H trendning.  Patient remains critically ill Prognosis is guarded   PAST MEDICAL HISTORY   Past Medical History:  Diagnosis Date  . C. difficile diarrhea 11/2017  . Elevated serum creatinine   . History of melanoma   . Hypertension   . Osteoarthritis   . Tobacco abuse      SURGICAL HISTORY   Past Surgical History:  Procedure Laterality Date  . ANKLE FRACTURE SURGERY    . ESOPHAGOGASTRODUODENOSCOPY (EGD) WITH PROPOFOL N/A 05/03/2018   Procedure: ESOPHAGOGASTRODUODENOSCOPY (EGD) WITH PROPOFOL;  Surgeon: Toledo, Benay Pike, MD;  Location: [facility] ENDOSCOPY;  Service: Gastroenterology;  Laterality: N/A;  . EXTERNAL EAR SURGERY    . INTRAMEDULLARY (IM) NAIL INTERTROCHANTERIC Left 12/01/2017   Procedure: INTRAMEDULLARY (IM) NAIL INTERTROCHANTRIC;  Surgeon: Thornton Park, MD;  Location: [facility] ORS;  Service: Orthopedics;  Laterality: Left;  . ROTATOR CUFF REPAIR    . TONSILLECTOMY    . TYMPANOPLASTY W/ MASTOIDECTOMY     Patient with reports of mastoid surgery (appears to have had surgery on TM; Left).     FAMILY HISTORY   Family History  Problem Relation Age of Onset  . Prostate cancer Neg Hx   . Bladder Cancer Neg Hx   . Kidney cancer Neg Hx      SOCIAL HISTORY   Social History   Tobacco Use  . Smoking status: Current Every Day Smoker    Packs/day: 1.00    Years: 70.00    Pack years: 70.00    Types: Cigarettes  . Smokeless tobacco: Never Used  Substance Use Topics  . Alcohol use: No  . Drug use: No     MEDICATIONS   Current Medication:  Current Facility-Administered Medications:   .  0.9 %  sodium chloride infusion (Manually program via Guardrails IV Fluids), , Intravenous, Once, Dew, Erskine Squibb, MD .  0.9 %  sodium chloride infusion, , Intravenous, PRN, Lucky Cowboy, Erskine Squibb, MD, Stopped at 05/04/18 2017 .  ALPRAZolam (XANAX) tablet 0.5 mg, 0.5 mg, Oral, QHS PRN, Algernon Huxley, MD, 0.5 mg at 05/04/18 1947 .  dexmedetomidine (PRECEDEX) 400 MCG/100ML (4 mcg/mL) infusion, 0.4-1.2 mcg/kg/hr, Intravenous, Titrated, Awilda Bill, NP, Stopped at 05/05/18 [phone] .  docusate sodium (COLACE) capsule 100 mg, 100 mg, Oral, BID PRN, Lucky Cowboy, Erskine Squibb, MD .  feeding supplement (ENSURE ENLIVE) (ENSURE ENLIVE) liquid 237 mL, 237 mL, Oral, TID BM, Algernon Huxley, MD, 237 mL at 05/04/18 2040 .  lactated ringers infusion, , Intravenous, Continuous, Dew, Erskine Squibb, MD, Last Rate: 10 mL/hr at 05/05/18 0500 .  ondansetron (ZOFRAN) injection 4 mg, 4 mg, Intravenous, Q6H PRN, Awilda Bill, NP .  pantoprazole (PROTONIX) 80 mg in sodium chloride 0.9 % 250 mL (0.32 mg/mL) infusion, 8 mg/hr, Intravenous, Continuous, Dew, Erskine Squibb, MD, Last Rate: 25 mL/hr at 05/05/18 0500, 8 mg/hr at 05/05/18 0500 .  [START ON 05/08/2018] pantoprazole (PROTONIX) injection 40 mg, 40 mg, Intravenous, Q12H, Dew, Erskine Squibb, MD    ALLERGIES   Aspirin    REVIEW OF SYSTEMS     Unable to obtain due to acutely ill status  PHYSICAL EXAMINATION   Vitals:   05/05/18 0843 05/05/18 0900  BP:  132/60  Pulse: (!) 107 66  Resp: (!) 27 (!)  22  Temp: 97.8 F (36.6 C)   SpO2: 100% 99%    GENERAL: Chronically ill-appearing HEAD: Normocephalic, atraumatic.  EYES: Pupils equal, round, reactive to light.  No scleral icterus.  MOUTH: Moist mucosal membrane. NECK: Supple. No thyromegaly. No nodules. No JVD.  PULMONARY: Mild bibasilar crepitations without rhonchorous breath sounds CARDIOVASCULAR: S1 and S2. Regular rate and rhythm. No murmurs, rubs, or gallops.  GASTROINTESTINAL: Soft, nontender, non-distended. No masses. Positive bowel  sounds. No hepatosplenomegaly.  MUSCULOSKELETAL: No swelling, clubbing, or edema.  NEUROLOGIC: Mild distress due to acute illness SKIN:intact,warm,dry   LABS AND IMAGING     LAB RESULTS: Recent Labs  Lab 05/04/18 0546 05/04/18 1419 05/05/18 0345  NA 138 135 136  K 3.3* 4.4 4.8  CL 111 112* 112*  CO2 22 18* 20*  BUN 40* 34* 37*  CREATININE 2.10* 2.04* 1.95*  GLUCOSE 121* 140* 125*   Recent Labs  Lab 05/04/18 0546 05/04/18 1419 05/05/18 0345  HGB 6.2* 6.1* 7.7*  HCT 18.6* 18.6* 22.9*  WBC 17.0* 16.7* 15.7*  PLT 289 250 206     IMAGING RESULTS: No results found.    ASSESSMENT AND PLAN     -Multidisciplinary rounds held today  Acute blood loss anemia    -Status post GI evaluation with EGD - difficulty with hemostasis requiring multiple attempts with injection of epinephrine followed by bipolar cautery x2  -s/p additional large volume bloody BM post EGD - s/p embolization -h/h trending  -s/p surgical evaluation-appreciate input, continue monitoring, if worsens possible surgery   Septic shock    Due to infectious diarreah - Norovirus +,    cdiff toxin negative  -supportive care  -will stop cdiff targeted antibiotics -appreciate pharmD   CARDIAC FAILURE- due to hypovolemic shock secondary to above     Now resolved  -oxygen as needed -follow up cardiac enzymes as indicated ICU monitoring    Renal Failure-acute on chronic renal failure stage 3  -follow chem 7 -follow UO -continue Foley Catheter-assess need daily   ID -continue IV abx as prescibed -follow up cultures  GI/Nutrition GI PROPHYLAXIS as indicated DIET-->TF's as tolerated Constipation protocol as indicated  ENDO - ICU hypoglycemic\Hyperglycemia protocol -check FSBS per protocol   ELECTROLYTES -follow labs as needed -replace as needed -pharmacy consultation   DVT/GI PRX ordered -SCDs  TRANSFUSIONS AS NEEDED MONITOR FSBS ASSESS the need for LABS as  needed   Critical care provider statement:   Critical care time (minutes): 34  Critical care time was exclusive of: Separately billable procedures and treating other patients  Critical care was necessary to treat or prevent imminent or life-threatening deterioration of the following conditions: Circulatory shock due to hypovolemic shock and septic shock from acute massive GI blood loss and colitis of viral etiology  Critical care was time spent personally by me on the following activities: Development of treatment plan with patient or surrogate, discussions with consultants, evaluation of patient's response to treatment, examination of patient, obtaining history from patient or surrogate, ordering and performing treatments and interventions, ordering and review of laboratory studies and re-evaluation of patient's condition.  I assumed direction of critical care for this patient from another provider in my specialty: no    This document was prepared using Dragon voice recognition software and may include unintentional dictation errors.    Ottie Glazier, M.D.  Division of [facility]

## 2018-05-05 NOTE — Progress Notes (Addendum)
[facility] SURGICAL PROGRESS NOTE (cpt [id number])  Hospital Day(s): 3.   Post op day(s): 1 Day Post-Op.   Interval History: Patient seen and examined, underwent coil emobolization with vascular surgery yesterday (Dr Lucky Cowboy).   History is somewhat limited due to patient participation. He is reporting diffuse sharp abdominal pain this morning. No fevers on vitals review. No reports of emesis from staff. Hgb stable at 7.7. Still with heme-tinged (likely old blood) stool in rectal bag.   Review of Systems:  Constitutional: denies fever Gastrointestinal: + abdominal pain, denied N/V  Full ROS limited secondary to patient participation   Vital signs in last 24 hours: [min-max] current  Temp:  [96.5 F (35.8 C)-98.2 F (36.8 C)] 97.1 F (36.2 C) (04/28 0145) Pulse Rate:  [60-95] 71 (04/28 0600) Resp:  [16-33] 18 (04/28 0600) BP: (92-175)/(45-86) 130/63 (04/28 0600) SpO2:  [91 %-100 %] 98 % (04/28 0600)     Height: 5\' 5"  (165.1 cm) Weight: 56.1 kg BMI (Calculated): 20.58   Intake/Output this shift:  No intake/output data recorded.    Physical Exam:  Constitutional: Somnolent, arouses to verbal stimuli HENT: normocephalic without obvious abnormality  Respiratory: breathing non-labored at rest  Cardiovascular: regular rate and sinus rhythm  Gastrointestinal: soft, non-tender, and non-distended. No peritonitis. Rectal tube with melena in bag   Labs:  CBC Latest Ref Rng & Units 05/05/2018 05/04/2018 05/04/2018  WBC 4.0 - 10.5 K/uL 15.7(H) 16.7(H) 17.0(H)  Hemoglobin 13.0 - 17.0 g/dL 7.7(L) 6.1(L) 6.2(L)  Hematocrit 39.0 - 52.0 % 22.9(L) 18.6(L) 18.6(L)  Platelets 150 - 400 K/uL 206 250 289   CMP Latest Ref Rng & Units 05/05/2018 05/04/2018 05/04/2018  Glucose 70 - 99 mg/dL 125(H) 140(H) 121(H)  BUN 8 - 23 mg/dL 37(H) 34(H) 40(H)  Creatinine 0.61 - 1.24 mg/dL 1.95(H) 2.04(H) 2.10(H)  Sodium 135 - 145 mmol/L 136 135 138  Potassium 3.5 - 5.1 mmol/L 4.8 4.4 3.3(L)  Chloride  98 - 111 mmol/L 112(H) 112(H) 111  CO2 22 - 32 mmol/L 20(L) 18(L) 22  Calcium 8.9 - 10.3 mg/dL 7.3(L) 6.8(L) 7.0(L)  Total Protein 6.5 - 8.1 g/dL - - -  Total Bilirubin 0.3 - 1.2 mg/dL - - -  Alkaline Phos 38 - 126 U/L - - -  AST 15 - 41 U/L - - -  ALT 0 - 44 U/L - - -     Imaging studies: No new pertinent imaging studies   Assessment/Plan: (ICD-10's: K92.2) 84 y.o. male with stable Hgb s/p transfusion (3 units pRBCs and 1 FFP) and coil embolization with vascular surgery who is currently hemodynamically stable without signs of active GI bleeding.   - NPO for now + IVF hydration   - pain control prn  - monitor abdominal examination  - trend WBC; concern for colitis given c diff +  - trend Hgb; transfuse as needed  - Continue to monitor for signs of re-bleeding. If this occurs, patient will likely require surgical intervention.    - further management per primary services   All of the above findings and recommendations were discussed with the patient and the medical team  -- Edison Simon, PA-C [facility] 05/05/2018, 8:17 AM [phone] M-F: 7am - 4pm  I saw and evaluated the patient.  I agree with the above documentation, exam, and plan, which I have edited where appropriate. Fredirick Maudlin  10:42 AM

## 2018-05-05 NOTE — Progress Notes (Signed)
[place] [facility] [place] NAME: Justin Andrews    MR#:  [id number]  DATE OF BIRTH:  10/06/1934  SUBJECTIVE:  CHIEF COMPLAINT:   Chief Complaint  Patient presents with  . Fall  . Cold Exposure   Appears stable, no complaint, has some confusion.  Continue to have some dark bowel movement overnight.  Hemoglobin dropped, called surgical consult as advised by GI.  They are suggesting vascular consult.  REVIEW OF SYSTEMS:  CONSTITUTIONAL: No fever, fatigue or weakness.  EYES: No blurred or double vision.  EARS, NOSE, AND THROAT: No tinnitus or ear pain.  RESPIRATORY: No cough, shortness of breath, wheezing or hemoptysis.  CARDIOVASCULAR: No chest pain, orthopnea, edema.  GASTROINTESTINAL: No nausea, vomiting, diarrhea or abdominal pain.  GENITOURINARY: No dysuria, hematuria.  ENDOCRINE: No polyuria, nocturia,  HEMATOLOGY: No anemia, easy bruising or bleeding SKIN: No rash or lesion. MUSCULOSKELETAL: No joint pain or arthritis.   NEUROLOGIC: No tingling, numbness, weakness.  PSYCHIATRY: No anxiety or depression.   ROS  DRUG ALLERGIES:   Allergies  Allergen Reactions  . Aspirin Other (See Comments)    bleeding    VITALS:  Blood pressure (!) 157/65, pulse 80, temperature 97.7 F (36.5 C), temperature source Axillary, resp. rate 19, height 5\' 5"  (1.651 m), weight 56.1 kg, SpO2 99 %.  PHYSICAL EXAMINATION:   GENERAL:  84 y.o.-year-old patient lying in the bed with critical appearance. EYES: Pupils equal, round, reactive to light and accommodation. No scleral icterus. Extraocular muscles intact.  Conjunctive are pale. HEENT: Head atraumatic, normocephalic. Oropharynx and nasopharynx clear.  NECK:  Supple, no jugular venous distention. No thyroid enlargement, no tenderness.  LUNGS: Normal breath sounds bilaterally, no wheezing, rales,rhonchi or crepitation. No use of accessory muscles of respiration.  CARDIOVASCULAR: S1, S2 normal. No murmurs,  rubs, or gallops.  ABDOMEN: Soft, nontender, nondistended. Bowel sounds present. No organomegaly or mass.  Bleeding per rectum present. EXTREMITIES: No pedal edema, cyanosis, or clubbing.  NEUROLOGIC: Cranial nerves II through XII are intact. Muscle strength 3-4/5 in all extremities. Sensation intact. Gait not checked.  PSYCHIATRIC: The patient is alert and oriented x 1.  SKIN: No obvious rash, lesion, or ulcer.   Physical Exam LABORATORY PANEL:   CBC Recent Labs  Lab 05/05/18 0345 05/05/18 1219  WBC 15.7*  --   HGB 7.7* 9.5*  HCT 22.9* 27.7*  PLT 206  --    ------------------------------------------------------------------------------------------------------------------  Chemistries  Recent Labs  Lab 05/02/18 0653  05/05/18 0345  NA 140   < > 136  K 4.9   < > 4.8  CL 103   < > 112*  CO2 13*   < > 20*  GLUCOSE 195*   < > 125*  BUN 81*   < > 37*  CREATININE 3.25*   < > 1.95*  CALCIUM 8.7*   < > 7.3*  MG  --    < > 1.9  AST 31  --   --   ALT 16  --   --   ALKPHOS 130*  --   --   BILITOT 0.6  --   --    < > = values in this interval not displayed.   ------------------------------------------------------------------------------------------------------------------  Cardiac Enzymes Recent Labs  Lab 05/02/18 0653 05/02/18 1956  TROPONINI 0.07* 0.05*   ------------------------------------------------------------------------------------------------------------------  RADIOLOGY:  No results found.  ASSESSMENT AND PLAN:   Principal Problem:   GI bleed Active Problems:   Hemorrhagic shock ([place])  Altered mental status   Anemia due to blood loss, acute  *Hemorrhagic shock Acute blood loss anemia GI bleed Hypothermia  After 4 units of transfusion hemoglobin stabilized, actively bleeding. GI physician is contacted by ER, monitor in stepdown unit. Keep n.p.o. and continue IV fluid. Continue IV PPI twice daily Blood pressure is stable with IV fluid and blood  transfusion. GI had suggested to do nuclear medicine bleeding scan which showed some active bleed in the rectal area. EGD was done which showed some bleed from duodenal area.  Patient still had some bleed so surgical consult was called in the suggesting vascular consult and trial of embolization.  *Acute renal failure on CKD stage III This could be due to acute blood loss. I am not sure about NSAIDs use. Continue to monitor with IV fluids.  Electrolytes are currently under control but have low bicarb.  *Hypertension We will hold medication with presentation of shock.  *History of alcoholism as per son Continue vitamin supplements.  *C. difficile colitis - diarrhea Patient had history of C. difficile in recent past. WBC count is high currently. Checked positive for C. difficile, started on oral Vanco and IV Flagyl.   All the records are reviewed and case discussed with Care Management/Social Workerr. Management plans discussed with the patient, family and they are in agreement.  CODE STATUS: DNR  TOTAL TIME TAKING CARE OF THIS PATIENT: 35 minutes.    POSSIBLE D/C IN 1-2 DAYS, DEPENDING ON CLINICAL CONDITION.   Vaughan Basta M.D on 05/05/2018   Between 7am to 6pm - Pager - [phone]  After 6pm go to [email or web address] - password [email or web address] [facility]  Office  [phone]  CC: Primary care physician; Leone Haven, MD  Note: This dictation was prepared with Dragon dictation along with smaller phrase technology. Any transcriptional errors that result from this process are unintentional.

## 2018-05-05 NOTE — Progress Notes (Signed)
[facility] Daily Progress Note   Subjective: 1 Day Post-Op: 1. Ultrasound guidance for vascular access right femoral artery 2. Catheter placement into celiac artery and then into the gastroduodenal artery and gastroduodenal artery branches including the pancreaticoduodenal artery 3. Aortogram and selective angiogram of the celiac artery and the gastroduodenal and pancreaticoduodenal arteries 4. Microbead embolization of the gastroduodenal artery with 1 cc of 500-700  polyvinyl alcohol beads.             5.  Coil embolization of the pancreaticoduodenal artery of the gastroduodenal artery as well as the main gastroduodenal artery back to its origin with a series of one 3 mm Ruby coil and three 4 mm Ruby coils 6. StarClose closure device right femoral artery  Patient sleeping this AM. Sedated. Sitter at bedside.   Objective: Vitals:   05/05/18 0700 05/05/18 0800 05/05/18 0843 05/05/18 0900  BP: 127/64 (!) 142/68  132/60  Pulse: 71 76 (!) 107 66  Resp: 19 (!) 21 (!) 27 (!) 22  Temp:   97.8 F (36.6 C)   TempSrc:   Oral   SpO2: 97% 99% 100% 99%  Weight:      Height:        Intake/Output Summary (Last 24 hours) at 05/05/2018 1126 Last data filed at 05/05/2018 0843 Gross per 24 hour  Intake 2745.76 ml  Output 1475 ml  Net 1270.76 ml   Physical Exam: Sedated, NAD CV: RRR Pulmonary: CTA Bilaterally Abdomen: Soft, Nontender, Nondistended Right Groin: PAD in place. No drainage or swelling noted Vascular:   Right Lower Extremity: Thigh soft, Calf soft. Extremity warm distally to toes.   Left Lower Extremity:  Thigh soft, Calf soft. Extremity warm distally to toes.   Laboratory: CBC    Component Value Date/Time   WBC 15.7 (H) 05/05/2018 0345   HGB 7.7 (L) 05/05/2018 0345   HCT 22.9 (L) 05/05/2018 0345   PLT 206 05/05/2018 0345   BMET    Component Value Date/Time   NA 136 05/05/2018 0345    K 4.8 05/05/2018 0345   CL 112 (H) 05/05/2018 0345   CO2 20 (L) 05/05/2018 0345   GLUCOSE 125 (H) 05/05/2018 0345   BUN 37 (H) 05/05/2018 0345   CREATININE 1.95 (H) 05/05/2018 0345   CREATININE 2.08 (H) 02/13/2018 1518   CALCIUM 7.3 (L) 05/05/2018 0345   GFRNONAA 31 (L) 05/05/2018 0345   GFRAA 36 (L) 05/05/2018 0345   Assessment/Planning: The patient is an 84 year old male admitted with an upper GI bleed status post endovascular embolization postop day 1 1) Hbg stable this AM. 2) Embolization seems to be successful at this time.  If patient were to continue to bleed/show any signs of active bleeding repeat embolization would not be an option due to the high risk of perforation.  The patient would need surgical intervention. 3) Remove PAD from right groin. 4) At this time, vascular surgery will sign off.  The patient does not need to follow-up with [place] as an outpatient.  Discussed with Dr. Ellis Parents Miaa Latterell PA-C 05/05/2018 11:26 AM

## 2018-05-05 NOTE — Progress Notes (Signed)
Notified Dr. Lanney Gins about patient's continuation of abdominal pain- though assess shows that belly is soft and active bowel sounds- hgb and vitals stable.  Dilaudid was ordered and I notified Dr. Celine Ahr with surgery as well- no new orders.

## 2018-05-05 NOTE — Progress Notes (Signed)
Patient at the beginning of shift was confused, restless and sad. Patient started on precedex and x2 of fentanyl for acute pain. NP [place]ak[place] aware. Patient leg splinted to prevent bending at the waist and knee where the PAD and sheath was placed. Patient slept for majority of the night with no further complications. Two units of blood given and one of FFP.

## 2018-05-06 ENCOUNTER — Inpatient Hospital Stay: Payer: Medicare HMO

## 2018-05-06 ENCOUNTER — Encounter: Payer: Self-pay | Admitting: Vascular Surgery

## 2018-05-06 LAB — BASIC METABOLIC PANEL
Anion gap: 7 (ref 5–15)
BUN: 27 mg/dL — ABNORMAL HIGH (ref 8–23)
CO2: 19 mmol/L — ABNORMAL LOW (ref 22–32)
Calcium: 7.8 mg/dL — ABNORMAL LOW (ref 8.9–10.3)
Chloride: 112 mmol/L — ABNORMAL HIGH (ref 98–111)
Creatinine, Ser: 1.68 mg/dL — ABNORMAL HIGH (ref 0.61–1.24)
GFR calc Af Amer: 43 mL/min — ABNORMAL LOW (ref >60)
GFR calc non Af Amer: 37 mL/min — ABNORMAL LOW (ref >60)
Glucose, Bld: 102 mg/dL — ABNORMAL HIGH (ref 70–99)
Potassium: 4.3 mmol/L (ref 3.5–5.1)
Sodium: 138 mmol/L (ref 135–145)

## 2018-05-06 LAB — CBC
HCT: 25.6 % — ABNORMAL LOW (ref 39.0–52.0)
Hemoglobin: 8.6 g/dL — ABNORMAL LOW (ref 13.0–17.0)
MCH: 30 pg (ref 26.0–34.0)
MCHC: 33.6 g/dL (ref 30.0–36.0)
MCV: 89.2 fL (ref 80.0–100.0)
Platelets: 265 10*3/uL (ref 150–400)
RBC: 2.87 MIL/uL — ABNORMAL LOW (ref 4.22–5.81)
RDW: 17.1 % — ABNORMAL HIGH (ref 11.5–15.5)
WBC: 21 10*3/uL — ABNORMAL HIGH (ref 4.0–10.5)
nRBC: 0.5 % — ABNORMAL HIGH (ref 0.0–0.2)

## 2018-05-06 LAB — TROPONIN I
Troponin I: 0.11 ng/mL (ref <0.03)
Troponin I: 0.08 ng/mL (ref <0.03)
Troponin I: 0.08 ng/mL (ref <0.03)

## 2018-05-06 LAB — MAGNESIUM: Magnesium: 1.8 mg/dL (ref 1.7–2.4)

## 2018-05-06 LAB — HEMOGLOBIN AND HEMATOCRIT, BLOOD
HCT: 27.5 % — ABNORMAL LOW (ref 39.0–52.0)
HCT: 25.4 % — ABNORMAL LOW (ref 39.0–52.0)
HCT: 25.9 % — ABNORMAL LOW (ref 39.0–52.0)
Hemoglobin: 9.3 g/dL — ABNORMAL LOW (ref 13.0–17.0)
Hemoglobin: 8.5 g/dL — ABNORMAL LOW (ref 13.0–17.0)
Hemoglobin: 8.5 g/dL — ABNORMAL LOW (ref 13.0–17.0)

## 2018-05-06 MED ORDER — METOPROLOL TARTRATE 5 MG/5ML IV SOLN
INTRAVENOUS | Status: AC
  Administered 2018-05-06: 5 mg
  Filled 2018-05-06: qty 5

## 2018-05-06 MED ORDER — METOPROLOL TARTRATE 5 MG/5ML IV SOLN: 2.5000 mg | Freq: Four times a day (QID) | INTRAVENOUS | Status: DC | PRN

## 2018-05-06 MED ORDER — METOPROLOL TARTRATE 5 MG/5ML IV SOLN: 5.0000 mg | Freq: Once | INTRAVENOUS | Status: AC

## 2018-05-06 MED ORDER — MAGNESIUM SULFATE 2 GM/50ML IV SOLN
2.0000 g | Freq: Once | INTRAVENOUS | Status: AC
  Administered 2018-05-06: 2 g via INTRAVENOUS
  Filled 2018-05-06: qty 50

## 2018-05-06 NOTE — Evaluation (Signed)
Occupational Therapy Evaluation Patient Details Name: Justin Andrews MRN: [id number] DOB: Sep 07, 1934 Today's Date: 05/06/2018    History of Present Illness Justin Andrews is an 84 yo male who comes to [facility] 4/25, found on the floor by neighbors minimally responsive in his stool/blood. Upon arrival, hypothermic, hypotensive on arrival and tachypneic, Hb: 3s. Per son, pt was advised to go to hospital by homehealth RN, but pt did not. Later afte rmore dark stools family called EMS, but pt refused to go to ED. PMH: C-diff, CKD, HTN, ETOH abuse, cigarrette use, Lt hip fracture Nov 2019 s/p ORIF and STR stay, recent fall with Right hip pain. Since admission pt is s/p multiple units PRBC and vascular procedure to address GI bleed.     Clinical Impression   Pt seen for OT evaluation this date. Prior to hospital admission, pt required some assist for ADL, but endorses he was fairly independent.  Pt lives alone, and has family that assist intermittently along with a home health nurse.  Currently pt demonstrates impairments in balance, strength, activity tolerance, and safety awareness requiring min/mod assist for LB ADLs and functional mobility.  Pt would benefit from skilled OT to address noted impairments and functional limitations (see below for any additional details) in order to maximize safety and independence while minimizing falls risk and caregiver burden.  Upon hospital discharge, recommend pt discharge with HHOT to promote further safety and functional independence within the pt's natural environment.    Follow Up Recommendations  Home health OT;Supervision - Intermittent    Equipment Recommendations  3 in 1 bedside commode;Toilet rise with handles;Tub/shower seat    Recommendations for Other Services       Precautions / Restrictions Precautions Precautions: Fall Restrictions Weight Bearing Restrictions: No      Mobility Bed Mobility Overal bed mobility: Needs Assistance Bed Mobility: Sit to  Supine     Supine to sit: Min assist     General bed mobility comments: Pt req assist for mgt of LEs during sit>sup transfer.   Transfers Overall transfer level: Needs assistance Equipment used: 1 person hand held assist Transfers: Stand Pivot Transfers   Stand pivot transfers: Min assist       General transfer comment: Pt able to ambulate from room recliner to EOB with 1 person hand held assist and support for mgt of lines/leads. Pt reuqired heavy VCs for safety. Was slightly impulsive with movement on this date.     Balance Overall balance assessment: History of Falls;No apparent balance deficits (not formally assessed)                                         ADL either performed or assessed with clinical judgement   ADL Overall ADL's : Needs assistance/impaired Eating/Feeding: Set up;Sitting   Grooming: Set up;Sitting   Upper Body Bathing: Set up;Minimal assistance;Sitting   Lower Body Bathing: Set up;Minimal assistance;Moderate assistance;Sit to/from stand;With adaptive equipment   Upper Body Dressing : Set up;Supervision/safety   Lower Body Dressing: Set up;Minimal assistance;Moderate assistance;Sit to/from stand Lower Body Dressing Details (indicate cue type and reason): Pt endorses he cannot reach his feet, unable to complete LB dressing independently at baseline.  Toilet Transfer: Set up;Minimal assistance;BSC;RW;Cueing for sequencing;Cueing for safety   Toileting- Clothing Manipulation and Hygiene: Set up;Minimal assistance;Sit to/from stand       Functional mobility during ADLs: Minimal assistance;Rolling walker;Cueing for sequencing;Cueing for  safety General ADL Comments: Pt endorses he requires asssitance for ADLs at baseline. History of falling.      Vision         Perception     Praxis      Pertinent Vitals/Pain Pain Assessment: Faces Pain Score: 5  Faces Pain Scale: Hurts a little bit Pain Location: Abdomon, R leg.  Pain  Descriptors / Indicators: Discomfort;Grimacing;Throbbing Pain Intervention(s): Limited activity within patient's tolerance;Monitored during session;Patient requesting pain meds-RN notified;Repositioned     Hand Dominance Right   Extremity/Trunk Assessment Upper Extremity Assessment Upper Extremity Assessment: Overall WFL for tasks assessed   Lower Extremity Assessment Lower Extremity Assessment: Overall WFL for tasks assessed       Communication Communication Communication: HOH   Cognition Arousal/Alertness: Awake/alert Behavior During Therapy: WFL for tasks assessed/performed Overall Cognitive Status: Within Functional Limits for tasks assessed                                     General Comments  Pt made comments during assessment such as "I wish I hadn't been found" or "Sometime I wonder why I'm still here". OT utilized therapeutic use of self and active listenting t/o session for emotional support.     Exercises Other Exercises Other Exercises: Pt educated in falls prevention strategies including use of adequate footwear during mobility, safe use of AE, and environmental modifications including reduction of clutter/obstacles in high traffic areas of the home. Pt verbalized understanding but would benefit from further education on falls prevention.    Shoulder Instructions      Home Living Family/patient expects to be discharged to:: Private residence Living Arrangements: Alone Available Help at Discharge: Family;Available PRN/intermittently(Niece provides asssitance at times. ) Type of Home: House Home Access: Stairs to enter [employer] of Steps: 8 in front, 3 in back Entrance Stairs-Rails: Left;Right Home Layout: One level               Home Equipment: Walker - 2 wheels          Prior Functioning/Environment Level of Independence: Independent with assistive device(s)        Comments: Pt reports independence with bathing,  dressing. Also reports AMB in with RW in houseprimarily, but struggles recently with longer than household distances. Pt reports difficulty with entry steps at home.         OT Problem List: Decreased strength;Impaired balance (sitting and/or standing);Pain;Decreased range of motion;Decreased safety awareness;Cardiopulmonary status limiting activity;Decreased activity tolerance;Decreased coordination;Decreased knowledge of use of DME or AE      OT Treatment/Interventions: Self-care/ADL training;Manual therapy;Therapeutic exercise;Modalities;Patient/family education;Balance training;Energy conservation;Therapeutic activities;DME and/or AE instruction    OT Goals(Current goals can be found in the care plan section) Acute Rehab OT Goals Patient Stated Goal: regain strength upon return to home to maintain independence  OT Goal Formulation: With patient Time For Goal Achievement: 05/20/18 Potential to Achieve Goals: Good ADL Goals Pt Will Perform Lower Body Bathing: with set-up;with adaptive equipment;sit to/from stand;with supervision(With LRAD for improved safety and functional independence.) Pt Will Perform Lower Body Dressing: with set-up;with supervision;with adaptive equipment;sit to/from stand(With LRAD for improved safety and functional independence.) Pt Will Transfer to Toilet: with supervision;bedside commode;ambulating;with set-up(With LRAD for improved safety and functional independence.) Additional ADL Goal #1: Pt will independently verbalize a plan to implement at least 3 learned falls prevention strategies into his daily routines/home environment upon hospital DC for improved safety.  OT  Frequency: Min 2X/week   Barriers to D/C: Inaccessible home environment;Decreased caregiver support          Co-evaluation              AM-PAC OT "6 Clicks" Daily Activity     Outcome Measure Help from another person eating meals?: None Help from another person taking care of personal  grooming?: A Little Help from another person toileting, which includes using toliet, bedpan, or urinal?: A Little Help from another person bathing (including washing, rinsing, drying)?: A Little Help from another person to put on and taking off regular upper body clothing?: A Little Help from another person to put on and taking off regular lower body clothing?: A Little 6 Click Score: 19   End of Session Equipment Utilized During Treatment: Gait belt Nurse Communication: Patient requests pain meds  Activity Tolerance: Patient tolerated treatment well Patient left: in bed;with bed alarm set;with call bell/phone within reach  OT Visit Diagnosis: Other abnormalities of gait and mobility (R26.89);History of falling (Z91.81);Pain Pain - Right/Left: Right Pain - part of body: Leg                Time: 0253-0313 OT Time Calculation (min): 20 min Charges:  OT General Charges $OT Visit: 1 Visit OT Evaluation $OT Eval Low Complexity: 1 Low OT Treatments $Self Care/Home Management : 8-22 mins  Shara Blazing, M.S., OTR/L Ascom: [phone] 05/06/18, 4:14 PM

## 2018-05-06 NOTE — Progress Notes (Signed)
PHARMACY CONSULT NOTE - FOLLOW UP  Pharmacy Consult for Electrolyte Monitoring and Replacement   Recent Labs: Potassium (mmol/L)  Date Value  05/06/2018 4.3   Magnesium (mg/dL)  Date Value  05/06/2018 1.8   Calcium (mg/dL)  Date Value  05/06/2018 7.8 (L)   Albumin (g/dL)  Date Value  05/02/2018 2.7 (L)   Phosphorus (mg/dL)  Date Value  05/04/2018 2.7   Sodium (mmol/L)  Date Value  05/06/2018 138   Corrected Ca = 8.3  Assessment: 84 yo male admitted with acute blood loss anemia.  Goal of Therapy:  Potassium ~4.0, Magnesium ~2.0  Plan:  Patient is receiving LR @ 100 mL/hr.   Magnesium 2g IV x 1.   Pharmacy will continue to monitor and adjust per consult.    Justin Andrews L 05/06/2018 4:30 PM

## 2018-05-06 NOTE — Progress Notes (Signed)
Per MD, cardiology will not be consulted and PT can work with pt.

## 2018-05-06 NOTE — Evaluation (Addendum)
Physical Therapy Evaluation Patient Details Name: Justin Andrews MRN: [id number] DOB: 03-21-1934 Today's Date: 05/06/2018   History of Present Illness  Justin Andrews is an 84 yo male who comes [facility] 4/25, found on the floor by neighbors minimally responsive in his stool/blood. Upon arrival, hypothermic, hypotensive on arrival and tachypneic, Hb: 3s. Per son, pt was advised to go to hospital by homehealth RN, but pt did not. Later after more dark stools family called EMS, but pt refused to go to ED. PMH: C-diff, CKD, HTN, ETOH abuse, cigarrette use, Lt hip fracture Nov 2019 s/p ORIF and STR stay, recent fall with Right hip pain. Since admission pt is s/p multiple units PRBC and vascular procedure to address GI bleed.    Clinical Impression  Pt admitted with above diagnosis. Pt currently with functional limitations due to the deficits listed below (see "PT Problem List"). Upon entry, pt in bed, easily made awake, and agreeable to participate. The pt is alert and oriented x3, pleasant, conversational, and generally a good historian. Pt moves to EOB without physical assistance, but endorses some dizziness that doesn't fully resolve: BP noted to drop ~42HCWC systolic. Pt performs stand pivot transfer to chair with 1-person hand hold assist, asks to turn to see out window, which is accomplished at end of session.  Functional mobility assessment demonstrates increased effort/time requirements, fair tolerance, and need for minimal physical assistance, whereas the patient performed these at a slightly higher level of independence PTA. Pt endorses mild-moderate acute general strength impairment. Pt will benefit from skilled PT intervention to increase independence and safety with basic mobility in preparation for discharge to the venue listed below.       Follow Up Recommendations Home health PT;Supervision for mobility/OOB    Equipment Recommendations  None recommended by PT    Recommendations for Other  Services       Precautions / Restrictions Precautions Precautions: Fall Restrictions Weight Bearing Restrictions: No      Mobility  Bed Mobility Overal bed mobility: Needs Assistance Bed Mobility: Supine to Sit     Supine to sit: Supervision     General bed mobility comments: dizzy upon sitting EOB, mild to moderate resolution only after 3 minutes   Transfers Overall transfer level: Needs assistance Equipment used: 1 person hand held assist Transfers: Stand Pivot Transfers   Stand pivot transfers: Min assist       General transfer comment: moving well in general, takes steps appropriately, but remains dizzy.   Ambulation/Gait Ambulation/Gait assistance: (deferred d/t BP drop and dizziness with sitting up)              Stairs            Wheelchair Mobility    Modified Rankin (Stroke Patients Only)       Balance Overall balance assessment: History of Falls;No apparent balance deficits (not formally assessed)                                           Pertinent Vitals/Pain Pain Assessment: Faces Faces Pain Scale: Hurts a little bit Pain Location: Right Leg  Pain Intervention(s): Limited activity within patient's tolerance;Monitored during session;Premedicated before session;Repositioned    Home Living Family/patient expects to be discharged to:: Private residence Living Arrangements: Alone Available Help at Discharge: Family(Niece provides assistance at times) Type of Home: House Home Access: Stairs to enter Entrance Stairs-Rails:  Left;Right Entrance Stairs-Number of Steps: 8 in front, 3 in back Home Layout: One level Home Equipment: Walker - 2 wheels      Prior Function Level of Independence: Independent with assistive device(s)         Comments: Pt reports independence with bathing, dressing. Also reports AMB in with RW in houseprimarily, but struggles recently with longer than household distances. Pt reports  difficulty with entry steps at home.      Hand Dominance        Extremity/Trunk Assessment   Upper Extremity Assessment Upper Extremity Assessment: Overall WFL for tasks assessed    Lower Extremity Assessment Lower Extremity Assessment: Overall WFL for tasks assessed       Communication   Communication: HOH  Cognition Arousal/Alertness: Awake/alert Behavior During Therapy: WFL for tasks assessed/performed Overall Cognitive Status: Within Functional Limits for tasks assessed                                        General Comments      Exercises     Assessment/Plan    PT Assessment Patient needs continued PT services  PT Problem List Decreased strength;Decreased activity tolerance;Decreased mobility       PT Treatment Interventions Therapeutic exercise;Gait training;Functional mobility training;Stair training;Therapeutic activities;Patient/family education    PT Goals (Current goals can be found in the Care Plan section)  Acute Rehab PT Goals Patient Stated Goal: regain strength upon return to home to maintain independence  PT Goal Formulation: With patient Time For Goal Achievement: 05/20/18 Potential to Achieve Goals: Good    Frequency Min 2X/week   Barriers to discharge Decreased caregiver support      Co-evaluation               AM-PAC PT "6 Clicks" Mobility  Outcome Measure Help needed turning from your back to your side while in a flat bed without using bedrails?: A Little Help needed moving from lying on your back to sitting on the side of a flat bed without using bedrails?: A Little Help needed moving to and from a bed to a chair (including a wheelchair)?: A Little Help needed standing up from a chair using your arms (e.g., wheelchair or bedside chair)?: A Little Help needed to walk in hospital room?: A Little Help needed climbing 3-5 steps with a railing? : A Little 6 Click Score: 18    End of Session Equipment Utilized  During Treatment: Gait belt Activity Tolerance: Patient tolerated treatment well;Treatment limited secondary to medical complications (Comment)(dizziness and postural hypotension) Patient left: in chair;with call bell/phone within reach Nurse Communication: Mobility status PT Visit Diagnosis: Muscle weakness (generalized) (M62.81);Other abnormalities of gait and mobility (R26.89);Difficulty in walking, not elsewhere classified (R26.2);Dizziness and giddiness (R42)    Time: 6979-4801 PT Time Calculation (min) (ACUTE ONLY): 31 min   Charges:   PT Evaluation $PT Eval Moderate Complexity: 1 Mod PT Treatments $Therapeutic Exercise: 8-22 mins       2:34 PM, 05/06/18 Etta Grandchild, PT, DPT Physical Therapist - [facility]  [phone] ([place])    [place] C 05/06/2018, 2:28 PM

## 2018-05-06 NOTE — Progress Notes (Addendum)
CRITICAL CARE NOTE       SUBJECTIVE FINDINGS & SIGNIFICANT EVENTS   Patient in no distress,  abdomen distened for KUB this am , discussed care plan with surgery.  NSVT overnight.  Hemodynamically stable, will downgrade to medical floor.   PAST MEDICAL HISTORY   Past Medical History:  Diagnosis Date  . C. difficile diarrhea 11/2017  . Elevated serum creatinine   . History of melanoma   . Hypertension   . Osteoarthritis   . Tobacco abuse      SURGICAL HISTORY   Past Surgical History:  Procedure Laterality Date  . ANKLE FRACTURE SURGERY    . ESOPHAGOGASTRODUODENOSCOPY (EGD) WITH PROPOFOL N/A 05/03/2018   Procedure: ESOPHAGOGASTRODUODENOSCOPY (EGD) WITH PROPOFOL;  Surgeon: Toledo, Benay Pike, MD;  Location: [facility] ENDOSCOPY;  Service: Gastroenterology;  Laterality: N/A;  . EXTERNAL EAR SURGERY    . INTRAMEDULLARY (IM) NAIL INTERTROCHANTERIC Left 12/01/2017   Procedure: INTRAMEDULLARY (IM) NAIL INTERTROCHANTRIC;  Surgeon: Thornton Park, MD;  Location: [facility] ORS;  Service: Orthopedics;  Laterality: Left;  . ROTATOR CUFF REPAIR    . TONSILLECTOMY    . TYMPANOPLASTY W/ MASTOIDECTOMY     Patient with reports of mastoid surgery (appears to have had surgery on TM; Left).  . VISCERAL ARTERY INTERVENTION N/A 05/04/2018   Procedure: VISCERAL ARTERY INTERVENTION;  Surgeon: Algernon Huxley, MD;  Location: [place] [facility];  Service: Cardiovascular;  Laterality: N/A;     FAMILY HISTORY   Family History  Problem Relation Age of Onset  . Prostate cancer Neg Hx   . Bladder Cancer Neg Hx   . Kidney cancer Neg Hx      SOCIAL HISTORY   Social History   Tobacco Use  . Smoking status: Current Every Day Smoker    Packs/day: 1.00    Years: 70.00    Pack years: 70.00    Types: Cigarettes  . Smokeless tobacco:  Never Used  Substance Use Topics  . Alcohol use: No  . Drug use: No     MEDICATIONS   Current Medication:  Current Facility-Administered Medications:  .  0.9 %  sodium chloride infusion (Manually program via Guardrails IV Fluids), , Intravenous, Once, Dew, Erskine Squibb, MD .  0.9 %  sodium chloride infusion, , Intravenous, PRN, Lucky Cowboy, Erskine Squibb, MD, Stopped at 05/04/18 2017 .  ALPRAZolam Duanne Moron) tablet 0.5 mg, 0.5 mg, Oral, QHS PRN, Algernon Huxley, MD, 0.5 mg at 05/05/18 2101 .  dexmedetomidine (PRECEDEX) 400 MCG/100ML (4 mcg/mL) infusion, 0.4-1.2 mcg/kg/hr, Intravenous, Titrated, Blakeney, Dreama Saa, NP, Last Rate: 8.42 mL/hr at 05/05/18 1541, 0.6 mcg/kg/hr at 05/05/18 1541 .  docusate sodium (COLACE) capsule 100 mg, 100 mg, Oral, BID PRN, Lucky Cowboy, Erskine Squibb, MD .  feeding supplement (ENSURE ENLIVE) (ENSURE ENLIVE) liquid 237 mL, 237 mL, Oral, TID BM, Algernon Huxley, MD, 237 mL at 05/04/18 2040 .  HYDROmorphone (DILAUDID) injection 1 mg, 1 mg, Intravenous, Q3H PRN, Ottie Glazier, MD, 1 mg at 05/05/18 2121 .  lactated ringers infusion, , Intravenous, Continuous, Dew, Erskine Squibb, MD, Last Rate: 100 mL/hr at 05/06/18 0700 .  morphine 2 MG/ML injection 1 mg, 1 mg, Intravenous, Q3H PRN, Ottie Glazier, MD, 1 mg at 05/05/18 1605 .  ondansetron (ZOFRAN) injection 4 mg, 4 mg, Intravenous, Q6H PRN, Awilda Bill, NP .  pantoprazole (PROTONIX) 80 mg in sodium chloride 0.9 % 250 mL (0.32 mg/mL) infusion, 8 mg/hr, Intravenous, Continuous, Dew, Erskine Squibb, MD, Last Rate: 25 mL/hr at 05/06/18 0700, 8  mg/hr at 05/06/18 0700 .  [START ON 05/08/2018] pantoprazole (PROTONIX) injection 40 mg, 40 mg, Intravenous, Q12H, Dew, Erskine Squibb, MD    ALLERGIES   Aspirin    REVIEW OF SYSTEMS     10 point ros negative except as per subjective findings  PHYSICAL EXAMINATION   Vitals:   05/06/18 0600 05/06/18 0837  BP: (!) 153/73   Pulse: (!) 105   Resp: 20   Temp:  98.2 F (36.8 C)  SpO2: 100%     GENERAL: No apparent  distress, chronically ill-appearing HEAD: Normocephalic, atraumatic.  EYES: Pupils equal, round, reactive to light.  No scleral icterus.  MOUTH: Moist mucosal membrane. NECK: Supple. No thyromegaly. No nodules. No JVD.  PULMONARY: Clear to auscultation bilaterally CARDIOVASCULAR: S1 and S2. Regular rate and rhythm. No murmurs, rubs, or gallops.  GASTROINTESTINAL: Soft, nontender, non-distended. No masses. Positive bowel sounds. No hepatosplenomegaly.  MUSCULOSKELETAL: No swelling, clubbing, or edema.  NEUROLOGIC: Alert awake and oriented to place self SKIN:intact,warm,dry   LABS AND IMAGING     LAB RESULTS: Recent Labs  Lab 05/04/18 1419 05/05/18 0345 05/06/18 0131  NA 135 136 138  K 4.4 4.8 4.3  CL 112* 112* 112*  CO2 18* 20* 19*  BUN 34* 37* 27*  CREATININE 2.04* 1.95* 1.68*  GLUCOSE 140* 125* 102*   Recent Labs  Lab 05/04/18 1419 05/05/18 0345  05/05/18 1943 05/06/18 0131 05/06/18 0723  HGB 6.1* 7.7*   < > 9.9* 9.3* 8.6*  HCT 18.6* 22.9*   < > 29.7* 27.5* 25.6*  WBC 16.7* 15.7*  --   --   --  21.0*  PLT 250 206  --   --   --  265   < > = values in this interval not displayed.     IMAGING RESULTS: No results found.    ASSESSMENT AND PLAN   -Multidisciplinary rounds held today  Acuteblood loss anemia -Status post GI evaluation with EGD -     -h/h mildly trending down- Hb 8.6 from 9.3 yesterday difficulty with hemostasis requiring multiple attempts with injection of epinephrine followed by bipolar cautery x2 -s/p additional large volume bloody BM post EGD - s/p embolization -h/h trending  -s/p surgical evaluation-appreciate input, continue monitoring, if worsens possible surgery   Septic shock Due to infectious diarreah - Norovirus +,    cdiff toxin negative -supportive care  -will stop cdiff targeted antibiotics -appreciate pharmD   CARDIAC FAILURE-due to hypovolemic shock secondary to above Now resolved  -oxygen as needed  -follow up cardiac enzymes as indicated ICU monitoring    Renal Failure-acute on chronic renal failure stage 3 -follow chem 7 -follow UO -continue Foley Catheter-assess need daily   ID -continue IV abx as prescibed -follow up cultures  GI/Nutrition GI PROPHYLAXIS as indicated DIET-->TF's as tolerated Constipation protocol as indicated  ENDO - ICU hypoglycemic\Hyperglycemia protocol -check FSBS per protocol   ELECTROLYTES -follow labs as needed -replace as needed -pharmacy consultation   DVT/GI PRX ordered -SCDs  TRANSFUSIONS AS NEEDED MONITOR FSBS ASSESS the need for LABS as needed   Critical care provider statement:  Critical care time (minutes):33 Critical care time was exclusive of: Separately billable procedures and treating other patients Critical care was necessary to treat or prevent imminent or life-threatening deterioration of the following conditions:Circulatory shock due to hypovolemic shock and septic shock from acute massive GI blood loss and colitis of viral etiology Critical care was time spent personally by me on the following activities: Development of  treatment plan with patient or surrogate, discussions with consultants, evaluation of patient's response to treatment, examination of patient, obtaining history from patient or surrogate, ordering and performing treatments and interventions, ordering and review of laboratory studies and re-evaluation of patient's condition. I assumed direction of critical care for this patient from another provider in my specialty: no   This document was prepared using Dragon voice recognition software and may include unintentional dictation errors.    Ottie Glazier, M.D.  Division of [place]

## 2018-05-06 NOTE — Progress Notes (Signed)
OT Cancellation Note  Patient Details Name: Justin Andrews MRN: [id number] DOB: [birth year]   Cancelled Treatment:    Reason Eval/Treat Not Completed: Medical issues which prohibited therapy Thank you for the OT consult. Order received and chart reviewed, acknowledge acute T-wave changes and other cardiac changes overnight mentioned in CHL. Will hold OT evaluation at this time until cardiology/attending clears patient for OOB and/or exertional activity.  Shara Blazing, M.S., OTR/L Ascom: [phone] 05/06/18, 11:44 AM

## 2018-05-06 NOTE — Progress Notes (Signed)
[facility] SURGICAL PROGRESS NOTE (cpt [phone])  Hospital Day(s): 4.   Post op day(s): 2 Days Post-Op.   Interval History: Patient seen and examined, no acute events or new complaints overnight. Patient is much more alert and interactive this morning. He does report that he has had worsening generalized abdominal pain and distension today. No fevers but he is tachycardic. No CP, SOB, emesis, nausea, or bowel movement. Leukocytosis has worsened to 21K this morning. Hgb 8.6 this morning.   Review of Systems:  Constitutional: denies fever, chills  Respiratory: denies any shortness of breath  Cardiovascular: denies chest pain or palpitations  Gastrointestinal: + abdominal pain/distension, denied N/V, or diarrhea/and bowel function as per interval history Genitourinary: denies burning with urination or urinary frequency   Vital signs in last 24 hours: [min-max] current  Temp:  [97.7 F (36.5 C)-98.4 F (36.9 C)] 98.4 F (36.9 C) (04/29 0206) Pulse Rate:  [66-112] 105 (04/29 0600) Resp:  [13-27] 20 (04/29 0600) BP: (132-172)/(51-90) 153/73 (04/29 0600) SpO2:  [96 %-100 %] 100 % (04/29 0600)     Height: 5\' 5"  (165.1 cm) Weight: 56.1 kg BMI (Calculated): 20.58   Intake/Output this shift:  No intake/output data recorded.    Physical Exam:  Constitutional: alert, cooperative and no distress  HENT: normocephalic without obvious abnormality  Eyes: PERRL, EOM's grossly intact and symmetric  Respiratory: breathing non-labored at rest  Cardiovascular: regular rate and sinus rhythm  Gastrointestinal: Soft, no reported tenderness, does appear distended, no rebound/guarding. No peritonitis Musculoskeletal:no edema or wounds, motor and sensation grossly intact, NT    Labs:  CBC Latest Ref Rng & Units 05/06/2018 05/05/2018 05/05/2018  WBC 4.0 - 10.5 K/uL - - -  Hemoglobin 13.0 - 17.0 g/dL 9.3(L) 9.9(L) 9.5(L)  Hematocrit 39.0 - 52.0 % 27.5(L) 29.7(L) 27.7(L)  Platelets 150 -  400 K/uL - - -   CMP Latest Ref Rng & Units 05/06/2018 05/05/2018 05/04/2018  Glucose 70 - 99 mg/dL 102(H) 125(H) 140(H)  BUN 8 - 23 mg/dL 27(H) 37(H) 34(H)  Creatinine 0.61 - 1.24 mg/dL 1.68(H) 1.95(H) 2.04(H)  Sodium 135 - 145 mmol/L 138 136 135  Potassium 3.5 - 5.1 mmol/L 4.3 4.8 4.4  Chloride 98 - 111 mmol/L 112(H) 112(H) 112(H)  CO2 22 - 32 mmol/L 19(L) 20(L) 18(L)  Calcium 8.9 - 10.3 mg/dL 7.8(L) 7.3(L) 6.8(L)  Total Protein 6.5 - 8.1 g/dL - - -  Total Bilirubin 0.3 - 1.2 mg/dL - - -  Alkaline Phos 38 - 126 U/L - - -  AST 15 - 41 U/L - - -  ALT 0 - 44 U/L - - -    Imaging studies: No new pertinent imaging studies   Assessment/Plan: (ICD-10's: K92.2) 84 y.o. male who is hemodynamically stable without any signs of active bleeding whose hemoglobin is improved and stable (s/p 3 units pRBCS and 1 unit FFP and coil embolization on 04/28 - Dew) although now with worsening leukocytosis with tachycardia but no fevers or evidence of peritonitis on examination   - NPO for now + IVF hydration              - pain control prn             - monitor abdominal examination  - Will obtain KUB given c diff + (question toxin positive), abdominal pain/distension             - trend WBC; concern for colitis given c diff + (question toxin positive)             -  trend Hgb; transfuse as needed             - Continue to monitor for signs of re-bleeding. If this occurs, patient will likely require surgical intervention.               - further management per primary services   All of the above findings and recommendations were discussed with the patient, and the medical team, and all of patient's questions were answered to his expressed satisfaction.  -- Edison Simon, PA-C [facility] 05/06/2018, 7:53 AM [phone] M-F: 7am - 4pm

## 2018-05-06 NOTE — Progress Notes (Signed)
PT Cancellation Note  Patient Details Name: Justin Andrews MRN: [id number] DOB: 30-Sep-1934   Cancelled Treatment:    Reason Eval/Treat Not Completed: Medical issues which prohibited therapy(Chart reviewed, acknowledge acute T-wave changes and other cardiac changes overnight mentioned in CHL. Will hold PT evaluation at this time until cardiology/attending clears patient for OOB and/or exertional activity. )  9:16 AM, 05/06/18 Etta Grandchild, PT, DPT Physical Therapist - [facility]  [phone] ([place])    [place] C 05/06/2018, 9:16 AM

## 2018-05-06 NOTE — Progress Notes (Signed)
Patient ECG changed from normal t waves to inverted t waves back and forth with 11-13 runs of V -Tac on two separate occurences. Np Dewaine Conger made aware and trending troponin's ordered.

## 2018-05-06 NOTE — Progress Notes (Signed)
[facility] [place] NAME: Justin Andrews    MR#:  [id number]  DATE OF BIRTH:  1934-12-21  SUBJECTIVE:  CHIEF COMPLAINT:   Chief Complaint  Patient presents with  . Fall  . Cold Exposure   Appears stable, no complaint, has some confusion.  Continue to have some dark bowel movement overnight.  Hemoglobin dropped, called surgical consult as advised by GI.  They are suggesting vascular consult.  Embolization was done which seems to be helping the bleed.  Hemoglobin is stable.  REVIEW OF SYSTEMS:  CONSTITUTIONAL: No fever, fatigue or weakness.  EYES: No blurred or double vision.  EARS, NOSE, AND THROAT: No tinnitus or ear pain.  RESPIRATORY: No cough, shortness of breath, wheezing or hemoptysis.  CARDIOVASCULAR: No chest pain, orthopnea, edema.  GASTROINTESTINAL: No nausea, vomiting, diarrhea or abdominal pain.  GENITOURINARY: No dysuria, hematuria.  ENDOCRINE: No polyuria, nocturia,  HEMATOLOGY: No anemia, easy bruising or bleeding SKIN: No rash or lesion. MUSCULOSKELETAL: No joint pain or arthritis.   NEUROLOGIC: No tingling, numbness, weakness.  PSYCHIATRY: No anxiety or depression.   ROS  DRUG ALLERGIES:   Allergies  Allergen Reactions  . Aspirin Other (See Comments)    bleeding    VITALS:  Blood pressure (!) 153/73, pulse (!) 105, temperature 98.2 F (36.8 C), temperature source Oral, resp. rate 20, height 5\' 5"  (1.651 m), weight 56.1 kg, SpO2 100 %.  PHYSICAL EXAMINATION:   GENERAL:  84 y.o.-year-old patient lying in the bed with critical appearance. EYES: Pupils equal, round, reactive to light and accommodation. No scleral icterus. Extraocular muscles intact.  Conjunctive are pale. HEENT: Head atraumatic, normocephalic. Oropharynx and nasopharynx clear.  NECK:  Supple, no jugular venous distention. No thyroid enlargement, no tenderness.  LUNGS: Normal breath sounds bilaterally, no wheezing, rales,rhonchi or crepitation. No use  of accessory muscles of respiration.  CARDIOVASCULAR: S1, S2 normal. No murmurs, rubs, or gallops.  ABDOMEN: Soft, nontender, nondistended. Bowel sounds present. No organomegaly or mass.  Bleeding per rectum present. EXTREMITIES: No pedal edema, cyanosis, or clubbing.  NEUROLOGIC: Cranial nerves II through XII are intact. Muscle strength 3-4/5 in all extremities. Sensation intact. Gait not checked.  PSYCHIATRIC: The patient is alert and oriented x 1.  SKIN: No obvious rash, lesion, or ulcer.   Physical Exam LABORATORY PANEL:   CBC Recent Labs  Lab 05/06/18 0723 05/06/18 1321  WBC 21.0*  --   HGB 8.6* 8.5*  HCT 25.6* 25.4*  PLT 265  --    ------------------------------------------------------------------------------------------------------------------  Chemistries  Recent Labs  Lab 05/02/18 0653  05/06/18 0131  NA 140   < > 138  K 4.9   < > 4.3  CL 103   < > 112*  CO2 13*   < > 19*  GLUCOSE 195*   < > 102*  BUN 81*   < > 27*  CREATININE 3.25*   < > 1.68*  CALCIUM 8.7*   < > 7.8*  MG  --    < > 1.8  AST 31  --   --   ALT 16  --   --   ALKPHOS 130*  --   --   BILITOT 0.6  --   --    < > = values in this interval not displayed.   ------------------------------------------------------------------------------------------------------------------  Cardiac Enzymes Recent Labs  Lab 05/06/18 0723 05/06/18 1321  TROPONINI 0.08* 0.08*   ------------------------------------------------------------------------------------------------------------------  RADIOLOGY:  Dg Abd 2 Views  Result Date: 05/06/2018  CLINICAL DATA:  Abdominal distension EXAM: ABDOMEN - 2 VIEW COMPARISON:  None. FINDINGS: There is gaseous distension of small bowel and colon. There is no bowel dilatation to suggest obstruction. There is no evidence of pneumoperitoneum, portal venous gas or pneumatosis. There are no pathologic calcifications along the expected course of the ureters. There is hazy left  basilar airspace disease likely reflecting atelectasis. Prior left intertrochanteric fracture ORIF. IMPRESSION: No bowel obstruction. Electronically Signed   By: Kathreen Devoid   On: 05/06/2018 10:27    ASSESSMENT AND PLAN:   Principal Problem:   GI bleed Active Problems:   Hemorrhagic shock (HCC)   Altered mental status   Anemia due to blood loss, acute  *Hemorrhagic shock Acute blood loss anemia GI bleed Hypothermia  After 4 units PRBC and FFP of transfusion hemoglobin stabilized,  Keep n.p.o. and continue IV fluid. Continue IV PPI twice daily Blood pressure is stable with IV fluid and blood transfusion. GI had suggested to do nuclear medicine bleeding scan which showed some active bleed in the rectal area. EGD was done which showed some bleed from duodenal area- cauterized.  Patient still had some bleed so surgical consult was called in the suggesting vascular consult and trial of embolization.  Status post embolization.  Hemoglobin is stable.  *Acute renal failure on CKD stage III This could be due to acute blood loss. I am not sure about NSAIDs use. Continue to monitor with IV fluids.  Electrolytes are currently under control   *Hypertension We will hold medication with presentation of shock.  *History of alcoholism as per son Continue vitamin supplements.  *C. difficile colitis ruled out- diarrhea due to norovirus. Patient had history of C. difficile in recent past. WBC count is high currently.  started on oral Vanco and IV Flagyl. His C. difficile antigen was positive but toxins were negative so stopped the C. difficile antibiotics.  C. difficile is ruled out. His diarrhea resolved.  His GI panel showed norovirus positive for   All the records are reviewed and case discussed with Care Management/Social Workerr. Management plans discussed with the patient, family and they are in agreement.  CODE STATUS: DNR  TOTAL TIME TAKING CARE OF THIS PATIENT: 35 minutes.   Need physical therapy evaluation.  POSSIBLE D/C IN 1-2 DAYS, DEPENDING ON CLINICAL CONDITION.   Vaughan Basta M.D on 05/06/2018   Between 7am to 6pm - Pager - [phone]  After 6pm go to [email or web address] - password [email or web address] [facility]  Office  [phone]  CC: Primary care physician; Leone Haven, MD  Note: This dictation was prepared with Dragon dictation along with smaller phrase technology. Any transcriptional errors that result from this process are unintentional.

## 2018-05-07 LAB — MAGNESIUM: Magnesium: 2.2 mg/dL (ref 1.7–2.4)

## 2018-05-07 LAB — CBC
HCT: 23 % — ABNORMAL LOW (ref 39.0–52.0)
Hemoglobin: 7.6 g/dL — ABNORMAL LOW (ref 13.0–17.0)
MCH: 29.9 pg (ref 26.0–34.0)
MCHC: 33 g/dL (ref 30.0–36.0)
MCV: 90.6 fL (ref 80.0–100.0)
Platelets: 242 10*3/uL (ref 150–400)
RBC: 2.54 MIL/uL — ABNORMAL LOW (ref 4.22–5.81)
RDW: 17.2 % — ABNORMAL HIGH (ref 11.5–15.5)
WBC: 21.6 10*3/uL — ABNORMAL HIGH (ref 4.0–10.5)
nRBC: 0.2 % (ref 0.0–0.2)

## 2018-05-07 LAB — BASIC METABOLIC PANEL
Anion gap: 6 (ref 5–15)
BUN: 21 mg/dL (ref 8–23)
CO2: 21 mmol/L — ABNORMAL LOW (ref 22–32)
Calcium: 7.4 mg/dL — ABNORMAL LOW (ref 8.9–10.3)
Chloride: 111 mmol/L (ref 98–111)
Creatinine, Ser: 1.67 mg/dL — ABNORMAL HIGH (ref 0.61–1.24)
GFR calc Af Amer: 44 mL/min — ABNORMAL LOW (ref >60)
GFR calc non Af Amer: 38 mL/min — ABNORMAL LOW (ref >60)
Glucose, Bld: 82 mg/dL (ref 70–99)
Potassium: 4.1 mmol/L (ref 3.5–5.1)
Sodium: 138 mmol/L (ref 135–145)

## 2018-05-07 LAB — CULTURE, BLOOD (ROUTINE X 2)
Culture: NO GROWTH
Culture: NO GROWTH
Special Requests: ADEQUATE

## 2018-05-07 LAB — HEMOGLOBIN
Hemoglobin: 8.3 g/dL — ABNORMAL LOW (ref 13.0–17.0)
Hemoglobin: 7.4 g/dL — ABNORMAL LOW (ref 13.0–17.0)

## 2018-05-07 LAB — HEMOGLOBIN AND HEMATOCRIT, BLOOD
HCT: 23.5 % — ABNORMAL LOW (ref 39.0–52.0)
HCT: 25.5 % — ABNORMAL LOW (ref 39.0–52.0)
Hemoglobin: 7.7 g/dL — ABNORMAL LOW (ref 13.0–17.0)
Hemoglobin: 8.1 g/dL — ABNORMAL LOW (ref 13.0–17.0)

## 2018-05-07 LAB — PHOSPHORUS: Phosphorus: 2.6 mg/dL (ref 2.5–4.6)

## 2018-05-07 MED ORDER — CARVEDILOL 3.125 MG PO TABS
6.2500 mg | ORAL_TABLET | Freq: Two times a day (BID) | ORAL | Status: DC
  Administered 2018-05-08: 6.25 mg via ORAL
  Filled 2018-05-07: qty 2

## 2018-05-07 NOTE — Care Management Important Message (Signed)
Important Message  Patient Details  Name: Justin Andrews MRN: [id number] Date of Birth: 09-28-1934   Medicare Important Message Given:  Yes  Entry made in error - IM not given yet.  Margaret Cockerill  Naija Troost A Syla Devoss 05/07/2018, 8:57 AM

## 2018-05-07 NOTE — TOC Progression Note (Signed)
Transition of Care [facility]) - Progression Note    Patient Details  Name: DMARIO RUSSOM MRN: [id number] Date of Birth: 1934/05/02  Transition of Care [facility]) CM/SW [place], RN Phone Number: 05/07/2018, 1:36 PM  Clinical Narrative:     Fulton Reek from [place] has called and said that the patient cant go home I explained that the patient is alert and oriented, I am not able to force the patient to accept [facility] services or to go to someone's home, I amrequired to let the patient decide what he wants to do as he is alert and oriented and not deemed incapacitated and is not IVC, he does not have a legal guardian and makes his own choices, Javuelinje gave her phone number [phone], she stated that the patient was not previously caring for himself, I explained that is why DSS was called.  I asked if she was seeking guardianship, she stated that no she is not. I let her know the patient stated that he may Discharge to his brother's home    Expected Discharge Plan: Home/Self Care Barriers to Discharge: Continued Medical Work up  Expected Discharge Plan and Services Expected Discharge Plan: Home/Self Care   Discharge Planning Services: CM Consult   Living arrangements for the past 2 months: Single Family Home                 DME Arranged: (declines)         HH Arranged: (refuses)           Social Determinants of Health (SDOH) Interventions    Readmission Risk Interventions Readmission Risk Prevention Plan 05/07/2018  Transportation Screening Complete  PCP or Specialist Appt within 3-5 Days Complete  HRI or Home Care Consult Complete  Medication Review (RN Care Manager) Complete  Some recent data might be hidden

## 2018-05-07 NOTE — Progress Notes (Signed)
OT Cancellation Note  Patient Details Name: Justin Andrews MRN: [id number] DOB: 04-19-1934   Cancelled Treatment:    Reason Eval/Treat Not Completed: Medical issues which prohibited therapy(Pt.'s IV out, and on the floor upon arrival. Nursing was notified. Will reattempt at a later time/date.)  Harrel Carina, MS, OTR/L 05/07/2018, 11:07 AM

## 2018-05-07 NOTE — Progress Notes (Signed)
PHARMACY CONSULT NOTE - FOLLOW UP  Pharmacy Consult for Electrolyte Monitoring and Replacement   Recent Labs: Potassium (mmol/L)  Date Value  05/07/2018 4.1   Magnesium (mg/dL)  Date Value  05/07/2018 2.2   Calcium (mg/dL)  Date Value  05/07/2018 7.4 (L)   Albumin (g/dL)  Date Value  05/02/2018 2.7 (L)   Phosphorus (mg/dL)  Date Value  05/07/2018 2.6   Sodium (mmol/L)  Date Value  05/07/2018 138   Corrected Ca = 8.44  Assessment: 84 yo male admitted with acute blood loss anemia.  Goal of Therapy:  Potassium ~4.0, Magnesium ~2.0  Plan:  Patient is receiving LR @ 100 mL/hr.   No additional replenishment warranted at this time.  Pharmacy will continue to monitor and adjust per consult.    Lu Duffel, PharmD, BCPS Clinical Pharmacist 05/07/2018 7:06 AM

## 2018-05-07 NOTE — TOC Progression Note (Signed)
Transition of Care [facility]) - Progression Note    Patient Details  Name: Justin Andrews MRN: [id number] Date of Birth: [birth year]  Transition of Care [facility]) CM/SW [place], RN Phone Number: 05/07/2018, 4:44 PM  Clinical Narrative:    DSS and APS has arrived to speak with the patient, they are offering him a group home type of facility, Peggye Form has requested an FL2 to be done, Fl2 already completed and on the chart     Expected Discharge Plan: Home/Self Care Barriers to Discharge: Continued Medical Work up  Expected Discharge Plan and Services Expected Discharge Plan: Home/Self Care   Discharge Planning Services: CM Consult   Living arrangements for the past 2 months: Single Family Home                 DME Arranged: (declines)         HH Arranged: (refuses)           Social Determinants of Health (SDOH) Interventions    Readmission Risk Interventions Readmission Risk Prevention Plan 05/07/2018  Transportation Screening Complete  PCP or Specialist Appt within 3-5 Days Complete  HRI or Home Care Consult Complete  Medication Review (RN Care Manager) Complete  Some recent data might be hidden

## 2018-05-07 NOTE — Progress Notes (Signed)
Dr Anselm Jungling notified via text that patient had dark red liquid stool. MD acknowledged, no new orders given.

## 2018-05-07 NOTE — TOC Initial Note (Signed)
Transition of Care [facility]) - Initial/Assessment Note    Patient Details  Name: Justin Andrews MRN: [id number] Date of Birth: 04-27-1934  Transition of Care [facility]) CM/SW Contact:    Su Hilt, RN Phone Number: 05/07/2018, 12:11 PM  Clinical Narrative:                 Talked with the patient for DC plan and needs He stated that he may go to his son's or his brothers at DC, his son and brother both provide transportation He has a walker he uses at home and says he gets around great, he refuses more DME and refuses Home health services, he states that he gets around great and has no needs He sees Dr Biagio Quint as PCP and [facility]   Expected Discharge Plan: Home/Self Care Barriers to Discharge: Continued Medical Work up   Patient Goals and CMS Choice Patient states their goals for this hospitalization and ongoing recovery are:: go home CMS Medicare.gov Compare Post Acute Care list provided to:: (declines)    Expected Discharge Plan and Services Expected Discharge Plan: Home/Self Care   Discharge Planning Services: CM Consult   Living arrangements for the past 2 months: Single Family Home                 DME Arranged: (declines)         HH Arranged: (refuses)          Prior Living Arrangements/Services Living arrangements for the past 2 months: Single Family Home Lives with:: Siblings Patient language and need for interpreter reviewed:: No Do you feel safe going back to the place where you live?: Yes      Need for Family Participation in Patient Care: No (Comment) Care giver support system in place?: Yes (comment) Current home services: DME(walker, cane) Criminal Activity/Legal Involvement Pertinent to Current Situation/Hospitalization: No - Comment as needed  Activities of Daily Living Home Assistive Devices/Equipment: Walker (specify type) ADL Screening (condition at time of admission) Patient's cognitive ability adequate to safely  complete daily activities?: No Is the patient deaf or have difficulty hearing?: Yes Does the patient have difficulty seeing, even when wearing glasses/contacts?: No Does the patient have difficulty concentrating, remembering, or making decisions?: Yes Patient able to express need for assistance with ADLs?: Yes Does the patient have difficulty dressing or bathing?: Yes Independently performs ADLs?: No Communication: Independent Dressing (OT): Needs assistance Is this a change from baseline?: Pre-admission baseline Grooming: Needs assistance Is this a change from baseline?: Pre-admission baseline Feeding: Independent Bathing: Needs assistance Is this a change from baseline?: Pre-admission baseline Toileting: Needs assistance Is this a change from baseline?: Pre-admission baseline In/Out Bed: Needs assistance Is this a change from baseline?: Pre-admission baseline Walks in Home: Needs assistance Is this a change from baseline?: Pre-admission baseline Does the patient have difficulty walking or climbing stairs?: Yes Weakness of Legs: Both Weakness of Arms/Hands: Both  Permission Sought/Granted                  Emotional Assessment Appearance:: Appears stated age Attitude/Demeanor/Rapport: Engaged Affect (typically observed): Accepting Orientation: : Oriented to Self, Oriented to Place, Oriented to  Time, Oriented to Situation Alcohol / Substance Use: Tobacco Use Psych Involvement: No (comment)  Admission diagnosis:  Shock ([place]) [R57.9] Acute gastrointestinal bleeding [K92.2] SIRS (systemic inflammatory response syndrome) (HCC) [R65.10] Hypothermia, initial encounter [T68.XXXA] Patient Active Problem List   Diagnosis Date Noted  . GI bleed 05/02/2018  . Hemorrhagic  shock ([place]) 05/02/2018  . Altered mental status 05/02/2018  . Anemia due to blood loss, acute 05/02/2018  . RLS (restless legs syndrome) 04/22/2018  . Wrist pain 04/22/2018  . Right hip pain 04/08/2018  .  Otitis media 03/19/2018  . Suicidal ideation 03/19/2018  . Protein-calorie malnutrition, severe 12/03/2017  . Closed left hip fracture ([place]) 11/30/2017  . Diarrhea 11/15/2017  . Duodenum disorder 11/15/2017  . Frequency of urination 10/21/2017  . Cigarette smoker 10/06/2017  . Cough 09/25/2017  . Changes in vision 09/25/2017  . Proteinuria 09/25/2017  . Basal cell carcinoma 02/28/2017  . 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 DOE (dyspnea on exertion) 01/17/2017  . Chronic right upper quadrant pain 11/15/2016  . Balance problem 11/15/2016  . Simple renal cyst 10/21/2016  . Prostate nodule 10/16/2016  . Dysuria 10/16/2016  . CKD (chronic kidney disease) stage 3, GFR 30-59 ml/min (HCC) 08/05/2016  . Anemia 08/05/2016  . Anxiety and depression 08/05/2016  . Weight loss 08/05/2016  . Sleep disturbance 07/23/2016  . Hypertension   . Osteoarthritis   . Tobacco abuse   . 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

## 2018-05-07 NOTE — Progress Notes (Signed)
   05/07/18 1514  PT Visit Information  Reason Eval/Treat Not Completed Patient at procedure or test/unavailable;Other (comment) (patient refused PT this AM, re-attempted in PM but nursing in room trying to establish new IV. will re-attempt PT treatment when patient agreeable and ready; )  Justin Andrews. Barnet Glasgow PT, DPT

## 2018-05-07 NOTE — Progress Notes (Signed)
[facility] SURGICAL PROGRESS NOTE (cpt [id number])  Hospital Day(s): 5.   Post op day(s): 3 Days Post-Op.   Interval History: Patient seen and examined, no acute events or new complaints overnight. Patient is somewhat a poor historian. He does note abdominal pain and is requesting pain medications. He has remained NPO. No reports of nausea or emesis. He is uncertain of last BM however staff reporting multiple bloody bowel movements. Hgb has been stable this morning.   Review of Systems:  Constitutional: denies fever, chills  Respiratory: denies any shortness of breath  Cardiovascular: denies chest pain or palpitations  Gastrointestinal: + abdominal pain, denies N/V, + melena/bloody stools Genitourinary: denies burning with urination or urinary frequency   Vital signs in last 24 hours: [min-max] current  Temp:  [98.1 F (36.7 C)-98.3 F (36.8 C)] 98.3 F (36.8 C) (04/30 0456) Pulse Rate:  [53-100] 90 (04/30 0456) Resp:  [16-18] 16 (04/30 0456) BP: (145-153)/(64-82) 146/64 (04/30 0456) SpO2:  [89 %-96 %] 96 % (04/30 0456) Weight:  [63.6 kg] 63.6 kg (04/29 2058)     Height: 5\' 5"  (165.1 cm) Weight: 63.6 kg BMI (Calculated): 23.35   Intake/Output this shift:  No intake/output data recorded.    Physical Exam:  Constitutional: alert, cooperative and no distress  HENT: normocephalic without obvious abnormality  Respiratory: breathing non-labored at rest  Cardiovascular: regular rate and sinus rhythm  Gastrointestinal: soft, generalized tenderness, mild distension, no rebound/guarding. Dark red stool present on bed   Labs:  CBC Latest Ref Rng & Units 05/07/2018 05/07/2018 05/07/2018  WBC 4.0 - 10.5 K/uL - - 21.6(H)  Hemoglobin 13.0 - 17.0 g/dL 8.3(L) 7.7(L) 7.6(L)  Hematocrit 39.0 - 52.0 % - 23.5(L) 23.0(L)  Platelets 150 - 400 K/uL - - 242   CMP Latest Ref Rng & Units 05/07/2018 05/06/2018 05/05/2018  Glucose 70 - 99 mg/dL 82 102(H) 125(H)  BUN 8 - 23 mg/dL 21 27(H)  37(H)  Creatinine 0.61 - 1.24 mg/dL 1.67(H) 1.68(H) 1.95(H)  Sodium 135 - 145 mmol/L 138 138 136  Potassium 3.5 - 5.1 mmol/L 4.1 4.3 4.8  Chloride 98 - 111 mmol/L 111 112(H) 112(H)  CO2 22 - 32 mmol/L 21(L) 19(L) 20(L)  Calcium 8.9 - 10.3 mg/dL 7.4(L) 7.8(L) 7.3(L)  Total Protein 6.5 - 8.1 g/dL - - -  Total Bilirubin 0.3 - 1.2 mg/dL - - -  Alkaline Phos 38 - 126 U/L - - -  AST 15 - 41 U/L - - -  ALT 0 - 44 U/L - - -     Imaging studies: No new pertinent imaging studies   Assessment/Plan: (ICD-10's: K60.2) 84 y.o. male with stable but elevated leukocytosis and persistent, but stable, anemia with dark red stool present in bed (unclear if old blood or re-bleeding) 3 Days Post-Op s/p coil embolization for duodenal ulcer.   - Remain NPO + IVF  - May need to consider PICC and TPN for nutrition  - pain control prn (minimize narcotics)   - monitor abdominal examination; bloody stools   - Trend hgb; monitor for re-bleed; transfuse if needed  - will continue to follow along, if evidence of re-bleed will require surgical intervention  - Medical management of comorbidities.    All of the above findings and recommendations were discussed with the patient, and the medical team, and all of patient's questions were answered to his expressed satisfaction.   -- Edison Simon, PA-C [facility] 05/07/2018, 12:03 PM [phone] M-F: 7am - 4pm

## 2018-05-07 NOTE — Progress Notes (Signed)
[facility] NAME: Savon Bordonaro    MR#:  [id number]  DATE OF BIRTH:  1934/02/13  SUBJECTIVE:  CHIEF COMPLAINT:   Chief Complaint  Patient presents with  . Fall  . Cold Exposure   Appears stable, no complaint, has some confusion.  Continue to have some dark bowel movement overnight.  Hemoglobin dropped, called surgical consult as advised by GI.  They are suggesting vascular consult.  Embolization was done which seems to be helping the bleed.  Hemoglobin is stable.  Patient is n.p.o. and asking something to eat.  REVIEW OF SYSTEMS:  CONSTITUTIONAL: No fever, fatigue or weakness.  EYES: No blurred or double vision.  EARS, NOSE, AND THROAT: No tinnitus or ear pain.  RESPIRATORY: No cough, shortness of breath, wheezing or hemoptysis.  CARDIOVASCULAR: No chest pain, orthopnea, edema.  GASTROINTESTINAL: No nausea, vomiting, diarrhea or abdominal pain.  GENITOURINARY: No dysuria, hematuria.  ENDOCRINE: No polyuria, nocturia,  HEMATOLOGY: No anemia, easy bruising or bleeding SKIN: No rash or lesion. MUSCULOSKELETAL: No joint pain or arthritis.   NEUROLOGIC: No tingling, numbness, weakness.  PSYCHIATRY: No anxiety or depression.   ROS  DRUG ALLERGIES:   Allergies  Allergen Reactions  . Aspirin Other (See Comments)    bleeding    VITALS:  Blood pressure (!) 146/64, pulse 90, temperature 98.3 F (36.8 C), resp. rate 16, height 5\' 5"  (1.651 m), weight 63.6 kg, SpO2 96 %.  PHYSICAL EXAMINATION:   GENERAL:  84 y.o.-year-old patient lying in the bed with critical appearance. EYES: Pupils equal, round, reactive to light and accommodation. No scleral icterus. Extraocular muscles intact.  Conjunctive are pale. HEENT: Head atraumatic, normocephalic. Oropharynx and nasopharynx clear.  NECK:  Supple, no jugular venous distention. No thyroid enlargement, no tenderness.  LUNGS: Normal breath sounds bilaterally, no wheezing, rales,rhonchi or  crepitation. No use of accessory muscles of respiration.  CARDIOVASCULAR: S1, S2 normal. No murmurs, rubs, or gallops.  ABDOMEN: Soft, nontender, nondistended. Bowel sounds present. No organomegaly or mass.  Bleeding per rectum present. EXTREMITIES: No pedal edema, cyanosis, or clubbing.  NEUROLOGIC: Cranial nerves II through XII are intact. Muscle strength 3-4/5 in all extremities. Sensation intact. Gait not checked.  PSYCHIATRIC: The patient is alert and oriented x 2.  SKIN: No obvious rash, lesion, or ulcer.   Physical Exam LABORATORY PANEL:   CBC Recent Labs  Lab 05/07/18 0117  05/07/18 1326  WBC 21.6*  --   --   HGB 7.6*   < > 8.1*  HCT 23.0*   < > 25.5*  PLT 242  --   --    < > = values in this interval not displayed.   ------------------------------------------------------------------------------------------------------------------  Chemistries  Recent Labs  Lab 05/02/18 0653  05/07/18 0117  NA 140   < > 138  K 4.9   < > 4.1  CL 103   < > 111  CO2 13*   < > 21*  GLUCOSE 195*   < > 82  BUN 81*   < > 21  CREATININE 3.25*   < > 1.67*  CALCIUM 8.7*   < > 7.4*  MG  --    < > 2.2  AST 31  --   --   ALT 16  --   --   ALKPHOS 130*  --   --   BILITOT 0.6  --   --    < > = values in this interval not displayed.   ------------------------------------------------------------------------------------------------------------------  Cardiac Enzymes Recent Labs  Lab 05/06/18 0723 05/06/18 1321  TROPONINI 0.08* 0.08*   ------------------------------------------------------------------------------------------------------------------  RADIOLOGY:  Dg Abd 2 Views  Result Date: 05/06/2018 CLINICAL DATA:  Abdominal distension EXAM: ABDOMEN - 2 VIEW COMPARISON:  None. FINDINGS: There is gaseous distension of small bowel and colon. There is no bowel dilatation to suggest obstruction. There is no evidence of pneumoperitoneum, portal venous gas or pneumatosis. There are no  pathologic calcifications along the expected course of the ureters. There is hazy left basilar airspace disease likely reflecting atelectasis. Prior left intertrochanteric fracture ORIF. IMPRESSION: No bowel obstruction. Electronically Signed   By: Kathreen Devoid   On: 05/06/2018 10:27    ASSESSMENT AND PLAN:   Principal Problem:   GI bleed Active Problems:   Hemorrhagic shock (HCC)   Altered mental status   Anemia due to blood loss, acute  *Hemorrhagic shock Acute blood loss anemia GI bleed Hypothermia  After 4 units PRBC and FFP of transfusion hemoglobin stabilized,  Keep n.p.o. and continue IV fluid. Continue IV PPI twice daily Blood pressure is stable with IV fluid and blood transfusion. GI had suggested to do nuclear medicine bleeding scan which showed some active bleed in the rectal area. EGD was done which showed some bleed from duodenal area- cauterized.  Patient still had some bleed so  vascular consult done,  Status post embolization.  Hemoglobin is stable. If bleed again then surgery will consider laparotomy options.  Continue to monitor n.p.o. and watch for hemoglobin tomorrow. If his hemoglobin is stable tomorrow then we will start on clear liquid diet and upgrade the diet so I am not getting PICC line and starting TPN for now.  *Acute renal failure on CKD stage III This could be due to acute blood loss. Continue to monitor with IV fluids.  Electrolytes are currently under control   *Hypertension We will hold medication with presentation of shock. Now blood pressure is stable so we will resume Coreg at home dose.  *History of alcoholism as per son Continue vitamin supplements.  *C. difficile colitis ruled out- diarrhea due to norovirus. Patient had history of C. difficile in recent past. WBC count is high currently.  started on oral Vanco and IV Flagyl. His C. difficile antigen was positive but toxins were negative so stopped the C. difficile antibiotics.  C.  difficile is ruled out. His diarrhea resolved.  His GI panel showed norovirus positive .   All the records are reviewed and case discussed with Care Management/Social Workerr. Management plans discussed with the patient, family and they are in agreement.  CODE STATUS: DNR  TOTAL TIME TAKING CARE OF THIS PATIENT: 35 minutes.  Need physical therapy evaluation.  POSSIBLE D/C IN 1-2 DAYS, DEPENDING ON CLINICAL CONDITION.   Vaughan Basta M.D on 05/07/2018   Between 7am to 6pm - Pager - [phone]  After 6pm go to [email or web address] - password [email or web address] [facility]  Office  [phone]  CC: Primary care physician; Leone Haven, MD  Note: This dictation was prepared with Dragon dictation along with smaller phrase technology. Any transcriptional errors that result from this process are unintentional.

## 2018-05-07 NOTE — TOC Progression Note (Signed)
Transition of Care [facility]) - Progression Note    Patient Details  Name: HIROKI WINT MRN: [id number] Date of Birth: March 20, 1934  [facility] of [facility]) CM/SW [place], RN Phone Number: 05/07/2018, 2:22 PM  Clinical Narrative:     Spoke to the son [name] at [phone] I explained that the patient is alert and oriented, He is allowed to make his own choices.  I am not able to force him to go anywhere or do anything that he does not want to do. He said he will call and talk to the patient  Expected Discharge Plan: Home/Self Care Barriers to Discharge: Continued Medical Work up  Expected Discharge Plan and Services Expected Discharge Plan: Home/Self Care   Discharge Planning Services: CM Consult   Living arrangements for the past 2 months: Single Family Home                 DME Arranged: (declines)         HH Arranged: (refuses)           Social Determinants of Health (SDOH) Interventions    Readmission Risk Interventions Readmission Risk Prevention Plan 05/07/2018  Transportation Screening Complete  PCP or Specialist Appt within 3-5 Days Complete  HRI or Home Care Consult Complete  Medication Review (RN Care Manager) Complete  Some recent data might be hidden

## 2018-05-08 ENCOUNTER — Inpatient Hospital Stay: Payer: Medicare HMO | Admitting: Registered Nurse

## 2018-05-08 ENCOUNTER — Inpatient Hospital Stay: Payer: Self-pay

## 2018-05-08 ENCOUNTER — Encounter
Admission: EM | Disposition: A | Discharge status: Home / Self Care | Payer: Self-pay | Source: Home / Self Care | Attending: Internal Medicine

## 2018-05-08 HISTORY — PX: ESOPHAGOGASTRODUODENOSCOPY: SHX5428

## 2018-05-08 HISTORY — PX: LAPAROTOMY: SHX154

## 2018-05-08 HISTORY — PX: JEJUNOSTOMY: SHX313

## 2018-05-08 HISTORY — PX: CENTRAL VENOUS CATHETER INSERTION: SHX401

## 2018-05-08 LAB — CBC
HCT: 22.1 % — ABNORMAL LOW (ref 39.0–52.0)
HCT: 26.9 % — ABNORMAL LOW (ref 39.0–52.0)
Hemoglobin: 7 g/dL — ABNORMAL LOW (ref 13.0–17.0)
Hemoglobin: 8.7 g/dL — ABNORMAL LOW (ref 13.0–17.0)
MCH: 29.5 pg (ref 26.0–34.0)
MCH: 30.2 pg (ref 26.0–34.0)
MCHC: 31.7 g/dL (ref 30.0–36.0)
MCHC: 32.3 g/dL (ref 30.0–36.0)
MCV: 93.2 fL (ref 80.0–100.0)
MCV: 93.4 fL (ref 80.0–100.0)
Platelets: 261 10*3/uL (ref 150–400)
Platelets: 275 10*3/uL (ref 150–400)
RBC: 2.37 MIL/uL — ABNORMAL LOW (ref 4.22–5.81)
RBC: 2.88 MIL/uL — ABNORMAL LOW (ref 4.22–5.81)
RDW: 17.3 % — ABNORMAL HIGH (ref 11.5–15.5)
RDW: 16.6 % — ABNORMAL HIGH (ref 11.5–15.5)
WBC: 18.6 10*3/uL — ABNORMAL HIGH (ref 4.0–10.5)
WBC: 16.6 10*3/uL — ABNORMAL HIGH (ref 4.0–10.5)
nRBC: 0 % (ref 0.0–0.2)
nRBC: 0.2 % (ref 0.0–0.2)

## 2018-05-08 LAB — PROTIME-INR
INR: 1.1 (ref 0.8–1.2)
Prothrombin Time: 14 seconds (ref 11.4–15.2)

## 2018-05-08 LAB — BASIC METABOLIC PANEL
Anion gap: 8 (ref 5–15)
BUN: 19 mg/dL (ref 8–23)
CO2: 21 mmol/L — ABNORMAL LOW (ref 22–32)
Calcium: 7.6 mg/dL — ABNORMAL LOW (ref 8.9–10.3)
Chloride: 110 mmol/L (ref 98–111)
Creatinine, Ser: 1.61 mg/dL — ABNORMAL HIGH (ref 0.61–1.24)
GFR calc Af Amer: 45 mL/min — ABNORMAL LOW (ref >60)
GFR calc non Af Amer: 39 mL/min — ABNORMAL LOW (ref >60)
Glucose, Bld: 61 mg/dL — ABNORMAL LOW (ref 70–99)
Potassium: 3.9 mmol/L (ref 3.5–5.1)
Sodium: 139 mmol/L (ref 135–145)

## 2018-05-08 LAB — HEMOGLOBIN AND HEMATOCRIT, BLOOD
HCT: 22.2 % — ABNORMAL LOW (ref 39.0–52.0)
HCT: 21.2 % — ABNORMAL LOW (ref 39.0–52.0)
Hemoglobin: 7.1 g/dL — ABNORMAL LOW (ref 13.0–17.0)
Hemoglobin: 7 g/dL — ABNORMAL LOW (ref 13.0–17.0)

## 2018-05-08 LAB — APTT: aPTT: 25 seconds (ref 24–36)

## 2018-05-08 LAB — PREPARE RBC (CROSSMATCH)

## 2018-05-08 LAB — GLUCOSE, CAPILLARY: Glucose-Capillary: 89 mg/dL (ref 70–99)

## 2018-05-08 SURGERY — LAPAROTOMY, EXPLORATORY
Anesthesia: General

## 2018-05-08 MED ORDER — SODIUM CHLORIDE 0.9 % IV SOLN
INTRAVENOUS | Status: DC | PRN
  Administered 2018-05-08: 13:00:00 via INTRAVENOUS

## 2018-05-08 MED ORDER — PROPOFOL 10 MG/ML IV BOLUS
INTRAVENOUS | Status: DC | PRN
  Administered 2018-05-08: 120 mg via INTRAVENOUS

## 2018-05-08 MED ORDER — HYDROMORPHONE HCL 1 MG/ML IJ SOLN
1.0000 mg | INTRAMUSCULAR | Status: DC | PRN
  Administered 2018-05-09 (×5): 1 mg via INTRAVENOUS
  Filled 2018-05-08 (×6): qty 1

## 2018-05-08 MED ORDER — SODIUM CHLORIDE 0.9% IV SOLUTION: Freq: Once | INTRAVENOUS | Status: DC

## 2018-05-08 MED ORDER — ONDANSETRON HCL 4 MG/2ML IJ SOLN
INTRAMUSCULAR | Status: DC | PRN
  Administered 2018-05-08: 4 mg via INTRAVENOUS

## 2018-05-08 MED ORDER — SUGAMMADEX SODIUM 200 MG/2ML IV SOLN
INTRAVENOUS | Status: DC | PRN
  Administered 2018-05-08: 140 mg via INTRAVENOUS

## 2018-05-08 MED ORDER — SUCCINYLCHOLINE CHLORIDE 20 MG/ML IJ SOLN
INTRAMUSCULAR | Status: DC | PRN
  Administered 2018-05-08: 100 mg via INTRAVENOUS

## 2018-05-08 MED ORDER — LIDOCAINE HCL (PF) 2 % IJ SOLN
INTRAMUSCULAR | Status: AC
  Filled 2018-05-08: qty 10

## 2018-05-08 MED ORDER — FENTANYL CITRATE (PF) 100 MCG/2ML IJ SOLN: 25.0000 ug | INTRAMUSCULAR | Status: DC | PRN

## 2018-05-08 MED ORDER — ONDANSETRON HCL 4 MG/2ML IJ SOLN
INTRAMUSCULAR | Status: AC
  Filled 2018-05-08: qty 2

## 2018-05-08 MED ORDER — LORAZEPAM 2 MG/ML IJ SOLN
INTRAMUSCULAR | Status: AC
  Administered 2018-05-08: 0.25 mg via INTRAVENOUS
  Filled 2018-05-08: qty 1

## 2018-05-08 MED ORDER — SODIUM CHLORIDE 0.9 % IV SOLN
INTRAVENOUS | Status: DC | PRN
  Administered 2018-05-08: 30 mL

## 2018-05-08 MED ORDER — ROCURONIUM BROMIDE 50 MG/5ML IV SOLN
INTRAVENOUS | Status: AC
  Filled 2018-05-08: qty 1

## 2018-05-08 MED ORDER — LIDOCAINE HCL (CARDIAC) PF 100 MG/5ML IV SOSY
PREFILLED_SYRINGE | INTRAVENOUS | Status: DC | PRN
  Administered 2018-05-08: 100 mg via INTRAVENOUS

## 2018-05-08 MED ORDER — DEXAMETHASONE SODIUM PHOSPHATE 10 MG/ML IJ SOLN
INTRAMUSCULAR | Status: AC
  Filled 2018-05-08: qty 1

## 2018-05-08 MED ORDER — FENTANYL CITRATE (PF) 100 MCG/2ML IJ SOLN
INTRAMUSCULAR | Status: DC | PRN
  Administered 2018-05-08 (×2): 50 ug via INTRAVENOUS

## 2018-05-08 MED ORDER — TRACE MINERALS CR-CU-MN-SE-ZN 10-1000-500-60 MCG/ML IV SOLN
INTRAVENOUS | Status: AC
  Administered 2018-05-08: 20:00:00 via INTRAVENOUS
  Filled 2018-05-08: qty 960

## 2018-05-08 MED ORDER — DEXAMETHASONE SODIUM PHOSPHATE 10 MG/ML IJ SOLN
INTRAMUSCULAR | Status: DC | PRN
  Administered 2018-05-08: 10 mg via INTRAVENOUS

## 2018-05-08 MED ORDER — HYDROMORPHONE HCL 1 MG/ML IJ SOLN
0.5000 mg | INTRAMUSCULAR | Status: DC | PRN
  Administered 2018-05-08: 0.5 mg via INTRAVENOUS
  Filled 2018-05-08 (×2): qty 1

## 2018-05-08 MED ORDER — FAT EMULSION PLANT BASED 20 % IV EMUL
250.0000 mL | INTRAVENOUS | Status: AC
  Administered 2018-05-08: 20:00:00 250 mL via INTRAVENOUS
  Filled 2018-05-08: qty 250

## 2018-05-08 MED ORDER — ONDANSETRON HCL 4 MG/2ML IJ SOLN: 4.0000 mg | Freq: Once | INTRAMUSCULAR | Status: DC | PRN

## 2018-05-08 MED ORDER — SODIUM CHLORIDE 0.9 % IV SOLN
1.0000 g | Freq: Once | INTRAVENOUS | Status: AC
  Administered 2018-05-08: 13:00:00 1 g via INTRAVENOUS
  Filled 2018-05-08: qty 1

## 2018-05-08 MED ORDER — TRACE MINERALS CR-CU-MN-SE-ZN 10-1000-500-60 MCG/ML IV SOLN: INTRAVENOUS | Status: DC

## 2018-05-08 MED ORDER — DEXTROSE 50 % IV SOLN
1.0000 | Freq: Once | INTRAVENOUS | Status: AC
  Administered 2018-05-08: 50 mL via INTRAVENOUS
  Filled 2018-05-08: qty 50

## 2018-05-08 MED ORDER — LORAZEPAM 2 MG/ML IJ SOLN
0.2500 mg | Freq: Once | INTRAMUSCULAR | Status: AC
  Administered 2018-05-08: 0.25 mg via INTRAVENOUS

## 2018-05-08 MED ORDER — FENTANYL CITRATE (PF) 250 MCG/5ML IJ SOLN
INTRAMUSCULAR | Status: AC
  Filled 2018-05-08: qty 5

## 2018-05-08 MED ORDER — SEVOFLURANE IN SOLN
RESPIRATORY_TRACT | Status: AC
  Filled 2018-05-08: qty 250

## 2018-05-08 MED ORDER — ROCURONIUM BROMIDE 100 MG/10ML IV SOLN
INTRAVENOUS | Status: DC | PRN
  Administered 2018-05-08: 30 mg via INTRAVENOUS
  Administered 2018-05-08: 50 mg via INTRAVENOUS

## 2018-05-08 MED ORDER — METOPROLOL TARTRATE 5 MG/5ML IV SOLN: 5.0000 mg | Freq: Four times a day (QID) | INTRAVENOUS | Status: DC | PRN

## 2018-05-08 MED ORDER — PROPOFOL 10 MG/ML IV BOLUS
INTRAVENOUS | Status: AC
  Filled 2018-05-08: qty 20

## 2018-05-08 MED ORDER — ACETAMINOPHEN 10 MG/ML IV SOLN
INTRAVENOUS | Status: DC | PRN
  Administered 2018-05-08: 1000 mg via INTRAVENOUS

## 2018-05-08 MED ORDER — BUPIVACAINE-EPINEPHRINE (PF) 0.25% -1:200000 IJ SOLN
INTRAMUSCULAR | Status: DC | PRN
  Administered 2018-05-08: 30 mL

## 2018-05-08 MED ORDER — EPHEDRINE SULFATE 50 MG/ML IJ SOLN
INTRAMUSCULAR | Status: AC
  Filled 2018-05-08: qty 1

## 2018-05-08 MED ORDER — ACETAMINOPHEN 10 MG/ML IV SOLN
INTRAVENOUS | Status: AC
  Filled 2018-05-08: qty 100

## 2018-05-08 MED ORDER — PHENYLEPHRINE HCL (PRESSORS) 10 MG/ML IV SOLN
INTRAVENOUS | Status: DC | PRN
  Administered 2018-05-08: 150 ug via INTRAVENOUS
  Administered 2018-05-08: 100 ug via INTRAVENOUS
  Administered 2018-05-08 (×8): 200 ug via INTRAVENOUS
  Administered 2018-05-08 (×4): 100 ug via INTRAVENOUS

## 2018-05-08 MED ORDER — HYDROMORPHONE HCL 1 MG/ML IJ SOLN
0.5000 mg | Freq: Once | INTRAMUSCULAR | Status: AC
  Administered 2018-05-08: 23:00:00 0.5 mg via INTRAVENOUS

## 2018-05-08 MED ORDER — EPHEDRINE SULFATE 50 MG/ML IJ SOLN
INTRAMUSCULAR | Status: DC | PRN
  Administered 2018-05-08 (×3): 10 mg via INTRAVENOUS

## 2018-05-08 MED ORDER — LACTATED RINGERS IV SOLN
INTRAVENOUS | Status: DC
  Administered 2018-05-08 – 2018-05-10 (×3): via INTRAVENOUS

## 2018-05-08 MED ORDER — PHENYLEPHRINE HCL (PRESSORS) 10 MG/ML IV SOLN
INTRAVENOUS | Status: AC
  Filled 2018-05-08: qty 1

## 2018-05-08 MED ORDER — LIDOCAINE HCL URETHRAL/MUCOSAL 2 % EX GEL
CUTANEOUS | Status: DC | PRN
  Administered 2018-05-08: 1 via TOPICAL

## 2018-05-08 MED ORDER — SUCCINYLCHOLINE CHLORIDE 20 MG/ML IJ SOLN
INTRAMUSCULAR | Status: AC
  Filled 2018-05-08: qty 1

## 2018-05-08 MED ORDER — SODIUM CHLORIDE 0.9 % IV SOLN
INTRAVENOUS | Status: DC | PRN
  Administered 2018-05-08: 30 ug/min via INTRAVENOUS

## 2018-05-08 MED ORDER — GLYCOPYRROLATE 0.2 MG/ML IJ SOLN
INTRAMUSCULAR | Status: AC
  Filled 2018-05-08: qty 1

## 2018-05-08 MED ORDER — LIDOCAINE HCL URETHRAL/MUCOSAL 2 % EX GEL
CUTANEOUS | Status: AC
  Filled 2018-05-08: qty 5

## 2018-05-08 SURGICAL SUPPLY — 45 items
BRR ADH 6X5 SEPRAFILM 1 SHT (MISCELLANEOUS)
BULB RESERV EVAC DRAIN JP 100C (MISCELLANEOUS) ×3 IMPLANT
CANISTER SUCT 1200ML W/VALVE (MISCELLANEOUS) ×3 IMPLANT
CATH ROBINSON RED A/P 20FR (CATHETERS) ×6 IMPLANT
CHLORAPREP W/TINT 10.5 ML (MISCELLANEOUS) ×3 IMPLANT
COVER WAND RF STERILE (DRAPES) ×3 IMPLANT
DRAIN CHANNEL 19F RND (DRAIN) ×3 IMPLANT
DRAPE LAPAROTOMY 100X77 ABD (DRAPES) ×3 IMPLANT
DRSG OPSITE POSTOP 4X12 (GAUZE/BANDAGES/DRESSINGS) ×3 IMPLANT
DRSG TEGADERM 4X10 (GAUZE/BANDAGES/DRESSINGS) ×3 IMPLANT
DRSG TEGADERM 4X4.75 (GAUZE/BANDAGES/DRESSINGS) ×3 IMPLANT
ELECT CAUTERY BLADE 6.4 (BLADE) ×3 IMPLANT
ELECT REM PT RETURN 9FT ADLT (ELECTROSURGICAL) ×3
ELECTRODE REM PT RTRN 9FT ADLT (ELECTROSURGICAL) ×2 IMPLANT
GAUZE SPONGE 4X4 12PLY STRL (GAUZE/BANDAGES/DRESSINGS) ×3 IMPLANT
GLOVE BIO SURGEON STRL SZ 6.5 (GLOVE) ×6 IMPLANT
GLOVE SURG SYN 7.0 (GLOVE) ×12 IMPLANT
GLOVE SURG SYN 7.5  E (GLOVE) ×3
GLOVE SURG SYN 7.5 E (GLOVE) ×6 IMPLANT
GOWN STRL REUS W/ TWL LRG LVL3 (GOWN DISPOSABLE) ×12 IMPLANT
GOWN STRL REUS W/TWL LRG LVL3 (GOWN DISPOSABLE) ×18
IV CONNECTOR ONE LINK NDLESS (IV SETS) ×9 IMPLANT
KIT DEFENDO VALVE AND CONN (KITS) ×3 IMPLANT
LABEL OR SOLS (LABEL) ×3 IMPLANT
LIGASURE IMPACT 36 18CM CVD LR (INSTRUMENTS) ×3 IMPLANT
NEEDLE HYPO 22GX1.5 SAFETY (NEEDLE) ×3 IMPLANT
NS IRRIG 1000ML POUR BTL (IV SOLUTION) ×3 IMPLANT
PACK BASIN MAJOR ARMC (MISCELLANEOUS) ×3 IMPLANT
PACK COLON CLEAN CLOSURE (MISCELLANEOUS) ×3 IMPLANT
PLUG CATH AND CAP STER (CATHETERS) ×3 IMPLANT
SEPRAFILM MEMBRANE 5X6 (MISCELLANEOUS) IMPLANT
SPONGE LAP 18X18 RF (DISPOSABLE) ×3 IMPLANT
STAPLER SKIN PROX 35W (STAPLE) ×3 IMPLANT
SUT ETHILON 2 0 FS 18 (SUTURE) ×3 IMPLANT
SUT PDS AB 1 CT1 36 (SUTURE) ×9 IMPLANT
SUT PROLENE 0 CT 1 30 (SUTURE) ×9 IMPLANT
SUT SILK 2 0 (SUTURE) ×3
SUT SILK 2-0 18XBRD TIE 12 (SUTURE) ×2 IMPLANT
SUT SILK 3-0 (SUTURE) ×15 IMPLANT
SUT VIC AB 3-0 SH 27 (SUTURE) ×3
SUT VIC AB 3-0 SH 27X BRD (SUTURE) ×2 IMPLANT
SWAB DUAL CULTURE TRANS RED ST (MISCELLANEOUS) ×3 IMPLANT
SYR 10ML LL (SYRINGE) ×3 IMPLANT
SYR TOOMEY 50ML (SYRINGE) ×3 IMPLANT
TRAY FOLEY MTR SLVR 16FR STAT (SET/KITS/TRAYS/PACK) IMPLANT

## 2018-05-08 NOTE — Anesthesia Post-op Follow-up Note (Signed)
Anesthesia QCDR form completed.        

## 2018-05-08 NOTE — Progress Notes (Signed)
Pt had some orders that needed to be corrected. No telemetry order when he came back from OR, coreg and xanax still ordered po, metoprolol ordered 2.5 to 5 mg range.  Pt with uncontrolled pain of 9/10 with 0.5mg  Dilaudid.  Spoke with Dr Jannifer Franklin and pt received order for additional 0.5 mg Dilaudid IV and changed to 1 mg prn dose there after. Pt also requesting xanax for restless legs and inability to sleep.  Order for 0.25 mg ativan IV received and given. Dorna Bloom RN

## 2018-05-08 NOTE — Progress Notes (Signed)
OT Cancellation Note  Patient Details Name: Justin Andrews MRN: [id number] DOB: 19-Nov-1934   Cancelled Treatment:    Reason Eval/Treat Not Completed: Patient not medically ready(Pt. having a procedure for central line placement)  Harrel Carina, MS, OTR/L 05/08/2018, 4:18 PM

## 2018-05-08 NOTE — Anesthesia Postprocedure Evaluation (Signed)
Anesthesia Post Note  Patient: Paulla Fore Mincey  Procedure(s) Performed: EXPLORATORY LAPAROTOMY (N/A ) INSERTION CENTRAL LINE ADULT ESOPHAGOGASTRODUODENOSCOPY (EGD) JEJUNOSTOMY  Patient location during evaluation: PACU Anesthesia Type: General Level of consciousness: awake and alert Pain management: pain level controlled Vital Signs Assessment: post-procedure vital signs reviewed and stable Respiratory status: spontaneous breathing, nonlabored ventilation, respiratory function stable and patient connected to nasal cannula oxygen Cardiovascular status: blood pressure returned to baseline and stable Postop Assessment: no apparent nausea or vomiting Anesthetic complications: no     Last Vitals:  Vitals:   05/08/18 1813 05/08/18 1849  BP: (!) 118/59 120/67  Pulse: 67 71  Resp: 12 14  Temp: (!) 36.3 C (!) 36.3 C  SpO2: 92% 97%    Last Pain:  Vitals:   05/08/18 1849  TempSrc: Axillary  PainSc:                  Martha Clan

## 2018-05-08 NOTE — Anesthesia Procedure Notes (Signed)
Procedure Name: Intubation Date/Time: 05/08/2018 12:45 PM Performed by: Doreen Salvage, CRNA Pre-anesthesia Checklist: Patient identified, Emergency Drugs available, Suction available and Patient being monitored Patient Re-evaluated:Patient Re-evaluated prior to induction Oxygen Delivery Method: Circle system utilized Preoxygenation: Pre-oxygenation with 100% oxygen Induction Type: IV induction, Cricoid Pressure applied and Rapid sequence Ventilation: Mask ventilation without difficulty Laryngoscope Size: Mac and 3 Grade View: Grade II Tube type: Oral Tube size: 7.5 mm Number of attempts: 1 Airway Equipment and Method: Stylet Placement Confirmation: ETT inserted through vocal cords under direct vision,  positive ETCO2 and breath sounds checked- equal and bilateral Secured at: 23 cm Tube secured with: Tape Dental Injury: Teeth and Oropharynx as per pre-operative assessment

## 2018-05-08 NOTE — Progress Notes (Signed)
[facility] Hospital Day(s): 6.   Post op day(s): 4 Days Post-Op.   Interval History: Patient seen and examined, continues to have dark red stools per RN staff. Patient is very frustrated this morning about not being able to have any PO intake. He continues to note some abdominal discomfort. No complaints of fever, chills, nausea, or emesis. Hgb has been trending down and is 7.0 this morning.    Review of Systems:  Constitutional: denies fever, chills  Gastrointestinal: + abdominal pain, denies N/V, + melena   Vital signs in last 24 hours: [min-max] current  Temp:  [98.1 F (36.7 C)-98.3 F (36.8 C)] 98.1 F (36.7 C) (05/01 0404) Pulse Rate:  [93-104] 93 (05/01 0404) Resp:  [18-20] 19 (05/01 0404) BP: (140-152)/(59-73) 148/73 (05/01 0404) SpO2:  [95 %-97 %] 95 % (05/01 0404)     Height: 5\' 5"  (165.1 cm) Weight: 63.6 kg BMI (Calculated): 23.35   Intake/Output this shift:  No intake/output data recorded.    Physical Exam:  Constitutional: alert, cooperative and no distress  HENT: normocephalic without obvious abnormality  Respiratory: breathing non-labored at rest  Cardiovascular: regular rate and sinus rhythm  Gastrointestinal: soft, non-tender, and non-distended, no rebound/guarding   Labs:  CBC Latest Ref Rng & Units 05/08/2018 05/07/2018 05/07/2018  WBC 4.0 - 10.5 K/uL 18.6(H) - -  Hemoglobin 13.0 - 17.0 g/dL 7.0(L) 7.4(L) 8.1(L)  Hematocrit 39.0 - 52.0 % 22.1(L) - 25.5(L)  Platelets 150 - 400 K/uL 261 - -   CMP Latest Ref Rng & Units 05/08/2018 05/07/2018 05/06/2018  Glucose 70 - 99 mg/dL 61(L) 82 102(H)  BUN 8 - 23 mg/dL 19 21 27(H)  Creatinine 0.61 - 1.24 mg/dL 1.61(H) 1.67(H) 1.68(H)  Sodium 135 - 145 mmol/L 139 138 138  Potassium 3.5 - 5.1 mmol/L 3.9 4.1 4.3  Chloride 98 - 111 mmol/L 110 111 112(H)  CO2 22 - 32 mmol/L 21(L) 21(L) 19(L)  Calcium 8.9 - 10.3 mg/dL 7.6(L) 7.4(L) 7.8(L)  Total Protein 6.5 - 8.1 g/dL - - -   Total Bilirubin 0.3 - 1.2 mg/dL - - -  Alkaline Phos 38 - 126 U/L - - -  AST 15 - 41 U/L - - -  ALT 0 - 44 U/L - - -     Imaging studies: No new pertinent imaging studies   Assessment/Plan: (ICD-10's: K92.2) 84 y.o. male with worsening anemia likely secondary to re-bleeding duodenal ulcer despite non-invasive interventions.   - NPO + IVF  - Will consult for PICC + TPN given prolonged NPO status in the setting of GI bleeding   - Transfuse 1 unit pRBCs   - IV Invanz (1g) on hold to OR  - Will plan for exploratory laparotomy this afternoon with Dr Hampton Abbot pending OR/Anesthesia availability.   - All risks, benefits, and alternatives to the above emergent procedure(s) were discussed with the patient and his son Keifer Habib) all of their questions were answered to their expressed satisfaction, patient expresses they wish to proceed, and informed consent was obtained.  - Medical management of comorbidities   All of the above findings and recommendations were discussed with the patient, patient's family ([name] Reinoso via phone), and the medical team, and all of patient's and family's questions were answered to their expressed satisfaction.  -- Edison Simon, PA-C [facility] 05/08/2018, 7:12 AM [phone] M-F: 7am - 4pm

## 2018-05-08 NOTE — Progress Notes (Addendum)
PHARMACY CONSULT NOTE - FOLLOW UP  Pharmacy Consult for Electrolyte Monitoring and Replacement   Recent Labs: Potassium (mmol/L)  Date Value  05/08/2018 3.9   Magnesium (mg/dL)  Date Value  05/07/2018 2.2   Calcium (mg/dL)  Date Value  05/08/2018 7.6 (L)   Albumin (g/dL)  Date Value  05/02/2018 2.7 (L)   Phosphorus (mg/dL)  Date Value  05/07/2018 2.6   Sodium (mmol/L)  Date Value  05/08/2018 139   Corrected Ca = 8.64   Assessment: 84 yo male admitted with acute blood loss anemia (GI bleed post-embolization with positive Norovirus).   Patient is NPO.  Goal of Therapy:  Potassium ~4.0, Magnesium ~2.0  Plan:  Patient is receiving LR @ 100 mL/hr.   No additional replenishment warranted at this time.  Will check BMP again 05/03 with am labs.  Will need to consider rechecking phosphorous if patient begins TPN or resumes PO diet.  Addendum: Pt beginning TPN today - will monitor K, Mg, Phos daily x 3 days per refeed risk  Pharmacy will continue to monitor and adjust per consult.   Lu Duffel, PharmD, BCPS Clinical Pharmacist 05/08/2018 7:14 AM

## 2018-05-08 NOTE — Progress Notes (Signed)
05/08/18  Patient was supposed to have PICC line placed for TPN today, but two attempts were unsuccessful.  Will place central line in OR today, via IJ.  Discussed with patient's son Moss Berry, who verbally consented for this additional procedure today in the OR.   Olean Ree, MD

## 2018-05-08 NOTE — TOC Progression Note (Signed)
Transition of Care [facility]) - Progression Note    Patient Details  Name: Justin Andrews MRN: [id number] Date of Birth: [birth year]  Transition of Care [facility]) CM/SW Contact  Latanya Maudlin, RN Phone Number: 05/08/2018, 10:58 AM  Clinical Narrative:  RNCM received call from DSS regarding updates on Group home placement. Updated FL2 to show Group home/family care home instead of SNF. [facility] Patient still facing medical barriers to treat low hemoglobin and poor nutational status.      Expected Discharge Plan: Home/Self Care Barriers to Discharge: Continued Medical Work up  Expected Discharge Plan and Services Expected Discharge Plan: Home/Self Care   Discharge Planning Services: CM Consult   Living arrangements for the past 2 months: Single Family Home                 DME Arranged: (declines)         HH Arranged: (refuses)           Social Determinants of Health (SDOH) Interventions    Readmission Risk Interventions Readmission Risk Prevention Plan 05/07/2018  Transportation Screening Complete  PCP or Specialist Appt within 3-5 Days Complete  HRI or Home Care Consult Complete  Medication Review (RN Care Manager) Complete  Some recent data might be hidden

## 2018-05-08 NOTE — Anesthesia Preprocedure Evaluation (Signed)
Anesthesia Evaluation  Patient identified by MRN, date of birth, ID band Patient awake    Reviewed: Allergy & Precautions, NPO status , Patient's Chart, lab work & pertinent test results  History of Anesthesia Complications Negative for: history of anesthetic complications  Airway Mallampati: II  TM Distance: >3 FB Neck ROM: Full    Dental  (+) Edentulous Upper, Edentulous Lower   Pulmonary neg sleep apnea, neg COPD, Current Smoker,    breath sounds clear to auscultation- rhonchi (-) wheezing      Cardiovascular hypertension, Pt. on medications (-) CAD, (-) Past MI, (-) Cardiac Stents and (-) CABG  Rhythm:Regular Rate:Normal - Systolic murmurs and - Diastolic murmurs    Neuro/Psych neg Seizures PSYCHIATRIC DISORDERS Anxiety Depression negative neurological ROS     GI/Hepatic Neg liver ROS, Bleeding duodenal ulcer    Endo/Other  negative endocrine ROSneg diabetes  Renal/GU Renal InsufficiencyRenal disease     Musculoskeletal  (+) Arthritis ,   Abdominal (+) - obese,   Peds  Hematology  (+) anemia ,   Anesthesia Other Findings Past Medical History: 11/2017: C. difficile diarrhea No date: Elevated serum creatinine No date: History of melanoma No date: Hypertension No date: Osteoarthritis No date: Tobacco abuse   Reproductive/Obstetrics                             Lab Results  Component Value Date   WBC 18.6 (H) 05/08/2018   HGB 7.1 (L) 05/08/2018   HCT 22.2 (L) 05/08/2018   MCV 93.2 05/08/2018   PLT 261 05/08/2018    Anesthesia Physical Anesthesia Plan  ASA: III  Anesthesia Plan: General   Post-op Pain Management:    Induction: Intravenous  PONV Risk Score and Plan: 0 and Ondansetron  Airway Management Planned: Oral ETT  Additional Equipment:   Intra-op Plan:   Post-operative Plan: Extubation in OR  Informed Consent: I have reviewed the patients History and  Physical, chart, labs and discussed the procedure including the risks, benefits and alternatives for the proposed anesthesia with the patient or authorized representative who has indicated his/her understanding and acceptance.   Patient has DNR.  Discussed DNR with patient and Suspend DNR.   Dental advisory given  Plan Discussed with: CRNA and Anesthesiologist  Anesthesia Plan Comments:         Anesthesia Quick Evaluation

## 2018-05-08 NOTE — Progress Notes (Signed)
   05/08/18 1251  PT Visit Information  Reason Eval/Treat Not Completed Patient at procedure or test/unavailable (Patient off floor to get central line in OR today; will re-attempt PT treatment when patient is available. )  Norwood Levo. Barnet Glasgow PT, DPT

## 2018-05-08 NOTE — Brief Op Note (Signed)
05/08/2018  5:10 PM  PATIENT:  Justin Andrews  84 y.o. male  PRE-OPERATIVE DIAGNOSIS:  BLEEDING DUODENAL ULCER  POST-OPERATIVE DIAGNOSIS:  BLEEDING DUODENAL ULCER  PROCEDURE:  Procedure(s): EXPLORATORY LAPAROTOMY (N/A) INSERTION CENTRAL LINE ADULT ESOPHAGOGASTRODUODENOSCOPY (EGD) JEJUNOSTOMY  SURGEON:  Surgeon(s) and Role:    * Windle Huebert, MD - Primary    * Pabon, [place], MD - Assisting  ANESTHESIA:   general  EBL:  75 mL   BLOOD ADMINISTERED:325 CC PRBC  DRAINS: (3 Fr) Blake drain(s) in the RUQ   LOCAL MEDICATIONS USED:  BUPIVICAINE   SPECIMEN:  Source of Specimen:  duodenal stricture biopsy  DISPOSITION OF SPECIMEN:  PATHOLOGY  COUNTS:  YES  TOURNIQUET:  * No tourniquets in log *  DICTATION: .Dragon Dictation  PLAN OF CARE: Admit to inpatient   PATIENT DISPOSITION:  PACU - hemodynamically stable.   Delay start of Pharmacological VTE agent (>24hrs) due to surgical blood loss or risk of bleeding: yes

## 2018-05-08 NOTE — Progress Notes (Signed)
Preoperative Review   Patient is met in the preoperative holding area. The history is reviewed in the chart and with the patient. I personally reviewed the options and rationale as well as the risks of this procedure that have been previously discussed with the patient. All questions asked by the patient and/or family were answered to their satisfaction. We will plan for laparotomy and control of bleeding of duodenal ulcer. D/W the pt in detail, risks, benefits and possible complications. I am going to help Dr. Hampton Abbot since this is a complex procedure and for exposure. Patient agrees to proceed with this procedure at this time.  Caroleen Hamman M.D. FACS

## 2018-05-08 NOTE — Consult Note (Addendum)
[facility] NOTE   Pharmacy Consult for TPN Indication: GI bleed, long term NPO  Patient Measurements: Height: 5\' 5"  (165.1 cm) Weight: 140 lb 4.8 oz (63.6 kg) IBW/kg (Calculated) : 61.5 TPN AdjBW (KG): 56.1 Body mass index is 23.35 kg/m. Usual Weight: 150lbs  Assessment:  Pt with inadequate intake since admit. Pt remains NPO with continued GI bleed and now new ileus. Plan is for PICC line and TPN today. Per surgery note, plan is for ex lap later today.   Addendum - PICC line unsuccessful - changing to Central line   GI:  Current diet NPO, pantoprazole, LBM 4/30 Endo:  Insulin requirements in the past 24 hours: None Lytes: pharmacy on consult - stable x 2 days, LR @ 100 mL/hr Renal: CrCl 30.8 ml/min Cards: Carvedilol  Hepatobil: Neuro: ID: Norovirus +, Invanz for surgical prophylaxis  TPN Access: Central line TPN start date: planned for today (5/1)  Nutritional Goals (per RD recommendation on 5/1): KCal: 1700-1900 kcal/day Protein: 85-95g/day Fluid: 1.5-1.7L/day   Plan:  Will Initiate Clinimix 5/15 with electrolytes at 42ml/hr  Will Initiate 20% lipids at 92ml/hr x 12 hours per day  Will add MVI and trace elements daily   Will add IV thiamine 100mg  daily   Pt at high refeeding risk; will check P, K, and Mg daily for 3 days after TPN iniatition per pharmacy consult   Will keep total IVF + TPN rate at 145ml/hr per MD  Goal TPN rate is 80 ml/hr   Regimen @ goal rate of 37ml/hr will provide 1843kcal/day, 96g/day protein, 2131ml volume    Lu Duffel, PharmD, BCPS Clinical Pharmacist 05/08/2018 10:35 AM

## 2018-05-08 NOTE — Care Management Important Message (Deleted)
Important Message  Patient Details  Name: Justin Andrews MRN: [id number] Date of Birth: 04/22/1934   Medicare Important Message Given:  Yes    Scotlyn Mccranie A Dillinger Aston, RN 05/08/2018, 3:26 PM

## 2018-05-08 NOTE — Progress Notes (Addendum)
Nutrition Follow Up Note   DOCUMENTATION CODES:   Not applicable  INTERVENTION:   Initiate Clinimix 5/15 with electrolytes at 26ml/hr (Goal rate 61ml/hr once labs stable)  Initiate 20% lipids at 31ml/hr x 12 hours per day   Regimen @ goal rate of 61ml/hr will provide 1843kcal/day, 96g/day protein, 2111ml volume    Add MVI and trace elements daily    Add IV thiamine 100mg  daily    Pt at high refeeding risk; recommend check P, K, and Mg daily for 3 days after TPN iniatition.    Daily weights   Keep total IVF + TPN rate at 190ml/hr per MD  NUTRITION DIAGNOSIS:   Predicted suboptimal nutrient intake related to (hx EtOH abuse, concern for self-neglect) as evidenced by (per chart, slow weight loss from UBW of 150 lbs since 2016).  GOAL:   Patient will meet greater than or equal to 90% of their needs  -progressing with TPN  MONITOR:   Labs, Weight trends, I & O's, Skin, Other (Comment)(TPN)  REASON FOR ASSESSMENT:   Consult New TPN/TNA  ASSESSMENT:   84 year old male with PMHx of HTN, OA, tobacco abuse, EtOH abuse admitted with GI bleed secondary to oozing duodenal ulcer s/p bipolar cautery on 4/26, C Diff colitis, hemorrhagic shock.   Pt s/p embolization 4/27  Pt with inadequate intake since admit. Pt remains NPO with continued GI bleed and now new ileus. Plan is for PICC line and TPN today. Per surgery note, plan is for ex lap later today.    Medications reviewed and include: pantoprazole, LR @ 100 mL/hr, ertapenem   Labs reviewed: K 3.9 wnl P 2.6 wnl, Mg 2.2 wnl Wbc- 18.6(H), Hgb 7.1(L), Hct 22.2(L)  Diet Order:   Diet Order            Diet NPO time specified  Diet effective now             EDUCATION NEEDS:   Not appropriate for education at this time  Skin:  Skin Assessment: Skin Integrity Issues:(MSAD to groin and buttocks; ecchymosis)  Last BM:  5/1- type 7  Height:   Ht Readings from Last 1 Encounters:  05/03/18 5\' 5"  (1.651 m)   Weight:    Wt Readings from Last 1 Encounters:  05/06/18 63.6 kg   Ideal Body Weight:  61.8 kg  BMI:  Body mass index is 23.35 kg/m.  Estimated Nutritional Needs:   Kcal:  1700-1900kcal/day   Protein:  85-95g/day   Fluid:  1.5-1.7 L/day  Koleen Distance MS, RD, LDN Pager #- [phone] Office#- [phone] After Hours Pager: [phone]

## 2018-05-08 NOTE — OR Nursing (Signed)
From inpt room, awake and alert.  Anesthesia in to see, blood product infusing.  New IV, 20g left wrist started for anesthesia.    Pt awake and alert, advises "my belly" discomfort 5/10.

## 2018-05-08 NOTE — Care Management Important Message (Signed)
Important Message  Patient Details  Name: Justin Andrews MRN: [id number] Date of Birth: [birth year]   Medicare Important Message Given:  Yes    Sabreena Vogan A Krystin Keeven, RN 05/08/2018, 3:26 PM

## 2018-05-08 NOTE — Progress Notes (Signed)
[facility] [place] NAME: Justin Andrews    MR#:  [id number]  DATE OF BIRTH:  [birth year]  SUBJECTIVE:  CHIEF COMPLAINT:   Chief Complaint  Patient presents with  . Fall  . Cold Exposure   Appears stable, no complaint, has some confusion.  Continue to have some dark bowel movement overnight.  Hemoglobin dropped, called surgical consult as advised by GI.  They are suggesting vascular consult.  Embolization was done which seems to be helping the bleed.  Hemoglobin is dropped some.  Patient is given PRBC and taken to the OR today by surgery. REVIEW OF SYSTEMS:  CONSTITUTIONAL: No fever, fatigue or weakness.  EYES: No blurred or double vision.  EARS, NOSE, AND THROAT: No tinnitus or ear pain.  RESPIRATORY: No cough, shortness of breath, wheezing or hemoptysis.  CARDIOVASCULAR: No chest pain, orthopnea, edema.  GASTROINTESTINAL: No nausea, vomiting, diarrhea or abdominal pain.  GENITOURINARY: No dysuria, hematuria.  ENDOCRINE: No polyuria, nocturia,  HEMATOLOGY: No anemia, easy bruising or bleeding SKIN: No rash or lesion. MUSCULOSKELETAL: No joint pain or arthritis.   NEUROLOGIC: No tingling, numbness, weakness.  PSYCHIATRY: No anxiety or depression.   ROS  DRUG ALLERGIES:   Allergies  Allergen Reactions  . Aspirin Other (See Comments)    bleeding    VITALS:  Blood pressure (!) 110/53, pulse 66, temperature (!) 96.5 F (35.8 C), resp. rate 13, height 5\' 5"  (1.651 m), weight 63.1 kg, SpO2 98 %.  PHYSICAL EXAMINATION:   GENERAL:  84 y.o.-year-old patient lying in the bed with critical appearance. EYES: Pupils equal, round, reactive to light and accommodation. No scleral icterus. Extraocular muscles intact.  Conjunctive are pale. HEENT: Head atraumatic, normocephalic. Oropharynx and nasopharynx clear.  NECK:  Supple, no jugular venous distention. No thyroid enlargement, no tenderness.  LUNGS: Normal breath sounds bilaterally, no  wheezing, rales,rhonchi or crepitation. No use of accessory muscles of respiration.  CARDIOVASCULAR: S1, S2 normal. No murmurs, rubs, or gallops.  ABDOMEN: Soft, nontender, nondistended. Bowel sounds present. No organomegaly or mass.  Bleeding per rectum present. EXTREMITIES: No pedal edema, cyanosis, or clubbing.  NEUROLOGIC: Cranial nerves II through XII are intact. Muscle strength 3-4/5 in all extremities. Sensation intact. Gait not checked.  PSYCHIATRIC: The patient is alert and oriented x 3.  SKIN: No obvious rash, lesion, or ulcer.   Physical Exam LABORATORY PANEL:   CBC Recent Labs  Lab 05/08/18 0222  05/08/18 1325  WBC 18.6*  --   --   HGB 7.0*   < > 7.0*  HCT 22.1*   < > 21.2*  PLT 261  --   --    < > = values in this interval not displayed.   ------------------------------------------------------------------------------------------------------------------  Chemistries  Recent Labs  Lab 05/02/18 0653  05/07/18 0117 05/08/18 0222  NA 140   < > 138 139  K 4.9   < > 4.1 3.9  CL 103   < > 111 110  CO2 13*   < > 21* 21*  GLUCOSE 195*   < > 82 61*  BUN 81*   < > 21 19  CREATININE 3.25*   < > 1.67* 1.61*  CALCIUM 8.7*   < > 7.4* 7.6*  MG  --    < > 2.2  --   AST 31  --   --   --   ALT 16  --   --   --   ALKPHOS 130*  --   --   --  BILITOT 0.6  --   --   --    < > = values in this interval not displayed.   ------------------------------------------------------------------------------------------------------------------  Cardiac Enzymes Recent Labs  Lab 05/06/18 0723 05/06/18 1321  TROPONINI 0.08* 0.08*   ------------------------------------------------------------------------------------------------------------------  RADIOLOGY:  [place] Ekg Site Rite  Result Date: 05/08/2018 If Site Rite image not attached, placement could not be confirmed due to current cardiac rhythm.   ASSESSMENT AND PLAN:   Principal Problem:   GI bleed Active Problems:    Hemorrhagic shock (HCC)   Altered mental status   Anemia due to blood loss, acute  *Hemorrhagic shock Acute blood loss anemia GI bleed Hypothermia  After 4 units PRBC and FFP of transfusion hemoglobin stabilized,  Keep n.p.o. and continue IV fluid. Continue IV PPI twice daily Blood pressure is stable with IV fluid and blood transfusion. GI had suggested to do nuclear medicine bleeding scan which showed some active bleed in the rectal area. EGD was done which showed some bleed from duodenal area- cauterized.  Patient still had some bleed so  vascular consult done,  Status post embolization.   Hemoglobin dropped again so given 1 unit of PRBC and taken to the OR for exploratory laparotomy by surgery.  As he would stay n.p.o. for next few days central line is placed to start TPN.  He may need PICC line by Monday if does not improve.  *Acute renal failure on CKD stage III This could be due to acute blood loss. Continue to monitor with IV fluids.  Electrolytes are currently under control   *Hypertension We will hold medication with presentation of shock. Now blood pressure is stable so we will resume Coreg at home dose.  *History of alcoholism as per son Continue vitamin supplements.  *C. difficile colitis ruled out- diarrhea due to norovirus. Patient had history of C. difficile in recent past. WBC count is high currently.  started on oral Vanco and IV Flagyl. His C. difficile antigen was positive but toxins were negative so stopped the C. difficile antibiotics.  C. difficile is ruled out. His diarrhea resolved.  His GI panel showed norovirus positive .   All the records are reviewed and case discussed with Care Management/Social Workerr. Management plans discussed with the patient, family and they are in agreement.  CODE STATUS: DNR  TOTAL TIME TAKING CARE OF THIS PATIENT: 35 minutes.  Need physical therapy evaluation.  POSSIBLE D/C IN 1-2 DAYS, DEPENDING ON CLINICAL  CONDITION. Son was worried about him living alone so requested to have placement in some kind of group home facility.  Case manager was looking into that.  But now with more extensive surgeries we will have to wait for repeat physical therapy evaluation.  Vaughan Basta M.D on 05/08/2018   Between 7am to 6pm - Pager - [phone]  After 6pm go to [email or web address] - password [email or web address] [facility]  Office  [phone]  CC: Primary care physician; Leone Haven, MD  Note: This dictation was prepared with Dragon dictation along with smaller phrase technology. Any transcriptional errors that result from this process are unintentional.

## 2018-05-08 NOTE — NC FL2 (Addendum)
[place] LEVEL OF CARE SCREENING TOOL     IDENTIFICATION  Patient Name: Justin Andrews Birthdate: 31-May-1934 Sex: male Admission Date (Current Location): 05/02/2018  [place] and [place] Number:  [place] and Address:  [facility], [address] [place], [place] [address]      Provider Number: [id number]  Attending Physician Name and Address:  Vaughan Basta, *  Relative Name and Phone Number:  Robbert, Langlinais   [phone]     Current Level of Care: Hospital Recommended Level of Care: [facility] Prior Approval Number:    Date Approved/Denied:   PASRR Number: [id number]  Discharge Plan: Other (Comment)(group home)    Current Diagnoses: Patient Active Problem List   Diagnosis Date Noted  . GI bleed 05/02/2018  . Hemorrhagic shock ([place]) 05/02/2018  . Altered mental status 05/02/2018  . Anemia due to blood loss, acute 05/02/2018  . RLS (restless legs syndrome) 04/22/2018  . Wrist pain 04/22/2018  . Right hip pain 04/08/2018  . Otitis media 03/19/2018  . Suicidal ideation 03/19/2018  . Protein-calorie malnutrition, severe 12/03/2017  . Closed left hip fracture ([place]) 11/30/2017  . Diarrhea 11/15/2017  . Duodenum disorder 11/15/2017  . Frequency of urination 10/21/2017  . Cigarette smoker 10/06/2017  . Cough 09/25/2017  . Changes in vision 09/25/2017  . Proteinuria 09/25/2017  . Basal cell carcinoma 02/28/2017  . 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 DOE (dyspnea on exertion) 01/17/2017  . Chronic right upper quadrant pain 11/15/2016  . Balance problem 11/15/2016  . Simple renal cyst 10/21/2016  . Prostate nodule 10/16/2016  . Dysuria 10/16/2016  . CKD (chronic kidney disease) stage 3, GFR 30-59 ml/min (HCC) 08/05/2016  . Anemia 08/05/2016  . Anxiety and depression 08/05/2016  . Weight loss 08/05/2016  . Sleep disturbance 07/23/2016  . Hypertension   . Osteoarthritis   . Tobacco  abuse   . 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[place]) 03/06/2016    Orientation RESPIRATION BLADDER Height & Weight     Self, Time, Place  Normal Incontinent Weight: 63.6 kg Height:  5\' 5"  (165.1 cm)  BEHAVIORAL SYMPTOMS/MOOD NEUROLOGICAL BOWEL NUTRITION STATUS  (none) (none) Incontinent  Full Liquid diet   AMBULATORY STATUS COMMUNICATION OF NEEDS Skin       Normal                       Personal Care Assistance Level of Assistance              Functional Limitations Info  Sight, Hearing, Speech Sight Info: Adequate Hearing Info: Impaired Speech Info: Adequate    SPECIAL CARE FACTORS FREQUENCY  PT (By licensed PT)     PT Frequency: 5 X week              Contractures Contractures Info: Present    Additional Factors Info  Code Status Code Status Info: DNR             TAKE these medications   amLODipine 10 MG tablet Commonly known as:  NORVASC Take 1 tablet (10 mg total) by mouth daily.   calcium-vitamin D 500-200 MG-UNIT tablet Commonly known as:  OSCAL WITH D Take 1 tablet by mouth 2 (two) times daily.   carvedilol 6.25 MG tablet Commonly known as:  COREG Take 1 tablet (6.25 mg total) by mouth 2 (two) times daily with a meal. What changed:  how much to take   feeding supplement (  ENSURE ENLIVE) Liqd Take 237 mLs by mouth 2 (two) times daily between meals.   pantoprazole 40 MG tablet Commonly known as:  PROTONIX Take 1 tablet (40 mg total) by mouth 2 (two) times daily before a meal.   tamsulosin 0.4 MG Caps capsule Commonly known as:  FLOMAX Take 1 capsule (0.4 mg total) by mouth daily.   traMADol 50 MG tablet Commonly known as:  ULTRAM Take 1 tablet (50 mg total) by mouth every 8 (eight) hours as needed.   traZODone 50 MG tablet Commonly known as:  DESYREL Take 1 tablet (50 mg total) by mouth at bedtime as needed for up to 30 days for sleep.       Discharge Medications: Please see discharge summary for a list of discharge  medications.  Relevant Imaging Results:  Relevant Lab Results:   Additional Information ssn [id number]  Latanya Maudlin, RN

## 2018-05-08 NOTE — Transfer of Care (Signed)
Immediate Anesthesia Transfer of Care Note  Patient: Justin Andrews  Procedure(s) Performed: EXPLORATORY LAPAROTOMY (N/A ) INSERTION CENTRAL LINE ADULT ESOPHAGOGASTRODUODENOSCOPY (EGD) JEJUNOSTOMY  Patient Location: PACU  Anesthesia Type:General  Level of Consciousness: sedated  Airway & Oxygen Therapy: Patient Spontanous Breathing and Patient connected to face mask oxygen  Post-op Assessment: Report given to RN and Post -op Vital signs reviewed and stable  Post vital signs: Reviewed and stable  Last Vitals:  Vitals Value Taken Time  BP 117/63 05/08/2018  5:00 PM  Temp    Pulse 67 05/08/2018  5:04 PM  Resp 21 05/08/2018  5:04 PM  SpO2 100 % 05/08/2018  5:04 PM  Vitals shown include unvalidated device data.  Last Pain:  Vitals:   05/08/18 1222  TempSrc: Tympanic  PainSc: 5          Complications: No apparent anesthesia complications

## 2018-05-09 ENCOUNTER — Inpatient Hospital Stay: Payer: Medicare HMO

## 2018-05-09 ENCOUNTER — Encounter: Payer: Self-pay | Admitting: Surgery

## 2018-05-09 LAB — CBC WITH DIFFERENTIAL/PLATELET
Abs Immature Granulocytes: 0.25 10*3/uL — ABNORMAL HIGH (ref 0.00–0.07)
Basophils Absolute: 0 10*3/uL (ref 0.0–0.1)
Basophils Relative: 0 %
Eosinophils Absolute: 0 10*3/uL (ref 0.0–0.5)
Eosinophils Relative: 0 %
HCT: 28.8 % — ABNORMAL LOW (ref 39.0–52.0)
Hemoglobin: 9.3 g/dL — ABNORMAL LOW (ref 13.0–17.0)
Immature Granulocytes: 1 %
Lymphocytes Relative: 2 %
Lymphs Abs: 0.4 10*3/uL — ABNORMAL LOW (ref 0.7–4.0)
MCH: 30.2 pg (ref 26.0–34.0)
MCHC: 32.3 g/dL (ref 30.0–36.0)
MCV: 93.5 fL (ref 80.0–100.0)
Monocytes Absolute: 1 10*3/uL (ref 0.1–1.0)
Monocytes Relative: 4 %
Neutro Abs: 22 10*3/uL — ABNORMAL HIGH (ref 1.7–7.7)
Neutrophils Relative %: 93 %
Platelets: 306 10*3/uL (ref 150–400)
RBC: 3.08 MIL/uL — ABNORMAL LOW (ref 4.22–5.81)
RDW: 16.9 % — ABNORMAL HIGH (ref 11.5–15.5)
WBC: 23.6 10*3/uL — ABNORMAL HIGH (ref 4.0–10.5)
nRBC: 0 % (ref 0.0–0.2)

## 2018-05-09 LAB — COMPREHENSIVE METABOLIC PANEL
ALT: 15 U/L (ref 0–44)
AST: 30 U/L (ref 15–41)
Albumin: 2 g/dL — ABNORMAL LOW (ref 3.5–5.0)
Alkaline Phosphatase: 67 U/L (ref 38–126)
Anion gap: 9 (ref 5–15)
BUN: 24 mg/dL — ABNORMAL HIGH (ref 8–23)
CO2: 20 mmol/L — ABNORMAL LOW (ref 22–32)
Calcium: 7.3 mg/dL — ABNORMAL LOW (ref 8.9–10.3)
Chloride: 109 mmol/L (ref 98–111)
Creatinine, Ser: 1.74 mg/dL — ABNORMAL HIGH (ref 0.61–1.24)
GFR calc Af Amer: 41 mL/min — ABNORMAL LOW (ref >60)
GFR calc non Af Amer: 36 mL/min — ABNORMAL LOW (ref >60)
Glucose, Bld: 227 mg/dL — ABNORMAL HIGH (ref 70–99)
Potassium: 4.3 mmol/L (ref 3.5–5.1)
Sodium: 138 mmol/L (ref 135–145)
Total Bilirubin: 0.6 mg/dL (ref 0.3–1.2)
Total Protein: 4.7 g/dL — ABNORMAL LOW (ref 6.5–8.1)

## 2018-05-09 LAB — PHOSPHORUS: Phosphorus: 4.3 mg/dL (ref 2.5–4.6)

## 2018-05-09 LAB — BPAM RBC
Blood Product Expiration Date: 202005032359
ISSUE DATE / TIME: 202005011119
Unit Type and Rh: 6200

## 2018-05-09 LAB — TYPE AND SCREEN
ABO/RH(D): A POS
Antibody Screen: NEGATIVE
Unit division: 0

## 2018-05-09 LAB — PREALBUMIN: Prealbumin: 7 mg/dL — ABNORMAL LOW (ref 18–38)

## 2018-05-09 LAB — GLUCOSE, CAPILLARY
Glucose-Capillary: 156 mg/dL — ABNORMAL HIGH (ref 70–99)
Glucose-Capillary: 183 mg/dL — ABNORMAL HIGH (ref 70–99)
Glucose-Capillary: 203 mg/dL — ABNORMAL HIGH (ref 70–99)

## 2018-05-09 LAB — MAGNESIUM: Magnesium: 2 mg/dL (ref 1.7–2.4)

## 2018-05-09 LAB — TRIGLYCERIDES: Triglycerides: 108 mg/dL (ref <150)

## 2018-05-09 MED ORDER — ERTAPENEM SODIUM 1 G IJ SOLR: 1.0000 g | INTRAMUSCULAR | Status: DC

## 2018-05-09 MED ORDER — FAT EMULSION PLANT BASED 20 % IV EMUL
250.0000 mL | INTRAVENOUS | Status: AC
  Administered 2018-05-09: 250 mL via INTRAVENOUS
  Filled 2018-05-09: qty 250

## 2018-05-09 MED ORDER — HALOPERIDOL LACTATE 5 MG/ML IJ SOLN
5.0000 mg | Freq: Once | INTRAMUSCULAR | Status: AC
  Administered 2018-05-09: 5 mg via INTRAVENOUS

## 2018-05-09 MED ORDER — SODIUM CHLORIDE 0.9 % IV SOLN
1.0000 g | INTRAVENOUS | Status: DC
  Administered 2018-05-09: 09:00:00 1000 mg via INTRAVENOUS
  Filled 2018-05-09: qty 1

## 2018-05-09 MED ORDER — SODIUM CHLORIDE 0.9 % IV SOLN
500.0000 mg | Freq: Every morning | INTRAVENOUS | Status: DC
  Administered 2018-05-10 – 2018-05-12 (×3): 500 mg via INTRAVENOUS
  Filled 2018-05-09 (×5): qty 0.5

## 2018-05-09 MED ORDER — KETOROLAC TROMETHAMINE 30 MG/ML IJ SOLN
15.0000 mg | Freq: Four times a day (QID) | INTRAMUSCULAR | Status: DC | PRN
  Administered 2018-05-10 – 2018-05-13 (×8): 15 mg via INTRAVENOUS
  Filled 2018-05-09 (×9): qty 1

## 2018-05-09 MED ORDER — LORAZEPAM 2 MG/ML IJ SOLN
0.2500 mg | Freq: Three times a day (TID) | INTRAMUSCULAR | Status: DC | PRN
  Administered 2018-05-09: 21:00:00 0.25 mg via INTRAVENOUS
  Filled 2018-05-09: qty 1

## 2018-05-09 MED ORDER — METOPROLOL TARTRATE 5 MG/5ML IV SOLN
5.0000 mg | Freq: Four times a day (QID) | INTRAVENOUS | Status: DC
  Administered 2018-05-09 – 2018-05-16 (×29): 5 mg via INTRAVENOUS
  Filled 2018-05-09 (×29): qty 5

## 2018-05-09 MED ORDER — TRACE MINERALS CR-CU-MN-SE-ZN 10-1000-500-60 MCG/ML IV SOLN
INTRAVENOUS | Status: AC
  Administered 2018-05-09: 19:00:00 via INTRAVENOUS
  Filled 2018-05-09: qty 1920

## 2018-05-09 MED ORDER — LORAZEPAM BOLUS VIA INFUSION
0.2500 mg | Freq: Three times a day (TID) | INTRAVENOUS | Status: DC | PRN
  Filled 2018-05-09: qty 1

## 2018-05-09 MED ORDER — TRACE MINERALS CR-CU-MN-SE-ZN 10-1000-500-60 MCG/ML IV SOLN
INTRAVENOUS | Status: DC
  Filled 2018-05-09: qty 960

## 2018-05-09 MED ORDER — HALOPERIDOL LACTATE 5 MG/ML IJ SOLN
INTRAMUSCULAR | Status: AC
  Filled 2018-05-09: qty 1

## 2018-05-09 NOTE — Progress Notes (Signed)
05/09/2018  Subjective: Patient is 1 Day Post-Op s/p exlap, repair of duodenal bleeding ulcer, EGD, duodenal stricture biopsy, jejunostomy feeding tube, central line placement.  Patient pulled his NG tube last night and it was replaced without complications.  Denies any significant abdominal pain.  WBC elevated though likely as stress from surgery.  H&H improved after transfusion in OR and no further bleeding yesterday.    Vital signs: Temp:  [96.5 F (35.8 C)-98.2 F (36.8 C)] 98.2 F (36.8 C) (05/02 0450) Pulse Rate:  [66-84] 83 (05/02 1201) Resp:  [12-20] 20 (05/02 0450) BP: (110-148)/(53-74) 132/74 (05/02 1201) SpO2:  [92 %-100 %] 93 % (05/02 0450) Weight:  [66.2 kg] 66.2 kg (05/02 0452)   Intake/Output: 05/01 0701 - 05/02 0700 In: 5601.9 [I.V.:5241.9; Blood:360] Out: 1430 [Urine:925; Drains:430; Blood:75] Last BM Date: 05/09/18  Physical Exam: Constitutional: No acute distress Abdomen:  Soft, nondistended, appropriately tender to palpation.  Midline incision clean, dry, intact with staples in place.  Blake drain in RUQ with serosanguinous fluid.  Feeding tube in place, capped.  NG tube in place, to suction.  Labs:  Recent Labs    05/08/18 1616 05/09/18 0542  WBC 16.6* 23.6*  HGB 8.7* 9.3*  HCT 26.9* 28.8*  PLT 275 306   Recent Labs    05/08/18 0222 05/09/18 0542  NA 139 138  K 3.9 4.3  CL 110 109  CO2 21* 20*  GLUCOSE 61* 227*  BUN 19 24*  CREATININE 1.61* 1.74*  CALCIUM 7.6* 7.3*   Recent Labs    05/08/18 1325  LABPROT 14.0  INR 1.1    Imaging: Dg Chest 1 View  Result Date: 05/09/2018 CLINICAL DATA:  Central line and enteric tube placement. EXAM: CHEST  1 VIEW COMPARISON:  Chest x-ray dated May 02, 2018. FINDINGS: Interval placement of a right internal jugular central venous catheter with the tip in the mid SVC. Enteric tube tip just inside the stomach with the proximal side port in the lower esophagus. The heart size and mediastinal contours are within  normal limits. Atherosclerotic calcification of the aortic arch. Normal pulmonary vascularity. Hazy bibasilar opacities likely represent atelectasis. No pneumothorax or pleural effusion. No acute osseous abnormality. Unchanged partially visualized ileus. Embolization coils in the abdomen again noted. IMPRESSION: 1. Enteric tube tip just inside stomach with the proximal side port in the lower esophagus. Recommend advancing 8 cm. 2. Right internal jugular central venous catheter tip in the SVC. No pneumothorax. 3. Bibasilar atelectasis. Electronically Signed   By: Titus Dubin M.D.   On: 05/09/2018 04:12    Assessment/Plan: This is a 84 y.o. male s/p exlap, repair of duodenal bleeding ulcer, EGD, duodenal stricture biopsy, jejunostomy feeding tube, central line placement.  --Discussed with patient and with Dr. Bridgett Larsson that he will need to be NPO with NG tube to suction until POD#3 (Monday 5/4) at which point we will obtain a contrast study to evaluate the repair for any potential leak.  If negative, would be able to start doing po diet.   --In meantime, will continue with TPN per central line.   --If any issues with the repair (leak, abscess, dehiscence), then would use the J tube for feeds. --IV antibiotics periop --OOB, ambulate with PT   Melvyn Neth, [place]

## 2018-05-09 NOTE — Progress Notes (Signed)
PHARMACY NOTE:  ANTIMICROBIAL RENAL DOSAGE ADJUSTMENT  Current antimicrobial regimen includes a mismatch between antimicrobial dosage and estimated renal function.  As per policy approved by the Pharmacy & Therapeutics and Medical Executive Committees, the antimicrobial dosage will be adjusted accordingly.  Current antimicrobial dosage:  Invanz (ertapenem) 1g IV every 24 hours.  Indication: Intra-abdominal infection  Renal Function:  Estimated Creatinine Clearance: 28.5 mL/min (A) (by C-G formula based on SCr of 1.74 mg/dL (H)). []      On intermittent HD, scheduled: []      On CRRT    Antimicrobial dosage has been changed to: Invanz (ertapenem) 500mg  IV Q24H  Additional comments:   Thank you for allowing pharmacy to be a part of this patient's care.  Olivia Canter, [facility] 05/09/2018 1:56 PM

## 2018-05-09 NOTE — Progress Notes (Signed)
OT Cancellation Note  Patient Details Name: JAVANNI MARING MRN: [id number] DOB: [birth year]   Cancelled Treatment:    Reason Eval/Treat Not Completed: Patient declined, no reason specified(WIll continue to monitor and intervene at a later time/date.)  Harrel Carina, MS, OTR/L 05/09/2018, 9:43 AM

## 2018-05-09 NOTE — Progress Notes (Signed)
Physical Therapy Treatment Patient Details Name: Justin Andrews MRN: [id number] DOB: [birth year] Today's Date: 05/09/2018    History of Present Illness Justin Andrews is an 84 yo male who comes to [facility] 4/25, found on the floor by neighbors minimally responsive in his stool/blood. Upon arrival, hypothermic, hypotensive on arrival and tachypneic, Hb: 3s. Per son, pt was advised to go to hospital by homehealth RN, but pt did not. Later afte rmore dark stools family called EMS, but pt refused to go to ED. PMH: C-diff, CKD, HTN, ETOH abuse, cigarrette use, Lt hip fracture Nov 2019 s/p ORIF and STR stay, recent fall with Right hip pain. Since admission pt is s/p multiple units PRBC and vascular procedure to address GI bleed.      PT Comments    Patient is progressing slowly; He refused PT earlier today and requires increased motivation and encouragement for participation. Patient was able to transfer to bedside chair with IV pole/1 HHA with min A. He does require increased time for equipment management. Patient refused additional LE exercise. He would benefit from additional skilled PT Intervention to improve strength, balance and gait safety; Pt left in chair with chair alarm on and needs in reach;     Follow Up Recommendations  Home health PT;Supervision for mobility/OOB     Equipment Recommendations  None recommended by PT    Recommendations for Other Services       Precautions / Restrictions Precautions Precautions: Fall Restrictions Weight Bearing Restrictions: No    Mobility  Bed Mobility Overal bed mobility: Needs Assistance Bed Mobility: Supine to Sit     Supine to sit: Supervision     General bed mobility comments: Requires supervision with elevated head of bed and increased time for management of lines and tubes; Patient able to bring legs off bed and push self up into sitting with supervision without physical assistance.   Transfers Overall transfer level: Needs  assistance Equipment used: 1 person hand held assist(IV pole) Transfers: Sit to/from Stand Sit to Stand: Min assist         General transfer comment: patient transferred sit<>Stand from bed with IV pole with 1 person HHA, min A with most assistance for lines/lead management; Patient required mod VCs for hand placement and positioning specifically for safety due to increased equipment managment. x2 reps;   Ambulation/Gait Ambulation/Gait assistance: Min Web designer (Feet): 3 Feet Assistive device: IV Pole Gait Pattern/deviations: Step-to pattern;Decreased step length - left;Decreased step length - right;Decreased dorsiflexion - right;Decreased dorsiflexion - left;Narrow base of support;Trunk flexed Gait velocity: decreased   General Gait Details: patient took a few steps to bedside chair with min A for safety. He required increased time due to equipment management of several lines/leads. Patient exhibits fair safety awareness but did require min VCs for correct positioning to avoid pulling out any lines/leads;    Stairs             Wheelchair Mobility    Modified Rankin (Stroke Patients Only)       Balance Overall balance assessment: Needs assistance Sitting-balance support: No upper extremity supported;Feet supported Sitting balance-Leahy Scale: Good     Standing balance support: During functional activity Standing balance-Leahy Scale: Poor Standing balance comment: Requires min A for safety; uses IV pole for 1 HHA                            Cognition Arousal/Alertness: Awake/alert Behavior During Therapy: [facility] for  tasks assessed/performed Overall Cognitive Status: Within Functional Limits for tasks assessed                                        Exercises Other Exercises Other Exercises: attempted sitting exercise for LE strengthening but patient refused. When asked to do LAQ or ankle pumps patient refused.     General  Comments        Pertinent Vitals/Pain Pain Assessment: 0-10 Pain Score: 7  Pain Location: abdomen Pain Descriptors / Indicators: Aching;Discomfort Pain Intervention(s): Limited activity within patient's tolerance;Monitored during session;Repositioned    Home Living                      Prior Function            PT Goals (current goals can now be found in the care plan section) Acute Rehab PT Goals Patient Stated Goal: regain strength upon return to home to maintain independence  PT Goal Formulation: With patient Time For Goal Achievement: 05/20/18 Potential to Achieve Goals: Good Progress towards PT goals: Progressing toward goals    Frequency    Min 2X/week      PT Plan      Co-evaluation              AM-PAC PT "6 Clicks" Mobility   Outcome Measure  Help needed turning from your back to your side while in a flat bed without using bedrails?: None Help needed moving from lying on your back to sitting on the side of a flat bed without using bedrails?: A Little Help needed moving to and from a bed to a chair (including a wheelchair)?: A Little Help needed standing up from a chair using your arms (e.g., wheelchair or bedside chair)?: A Little Help needed to walk in hospital room?: A Little Help needed climbing 3-5 steps with a railing? : A Lot 6 Click Score: 18    End of Session Equipment Utilized During Treatment: Gait belt Activity Tolerance: Patient tolerated treatment well;Treatment limited secondary to medical complications (Comment)(dizziness and postural hypotension) Patient left: in chair;with call bell/phone within reach;with chair alarm set Nurse Communication: Mobility status PT Visit Diagnosis: Muscle weakness (generalized) (M62.81);Other abnormalities of gait and mobility (R26.89);Difficulty in walking, not elsewhere classified (R26.2);Dizziness and giddiness (R42)     Time: 9292-4462 PT Time Calculation (min) (ACUTE ONLY): 12  min  Charges:  $Therapeutic Activity: 8-22 mins                        , PT, DPT 05/09/2018, 1:49 PM

## 2018-05-09 NOTE — Progress Notes (Signed)
Pt combative, pulled NG tube out 2x. DR. Jannifer Franklin called, new orders given including Haldol. After haldol administration, Pt is currently picking at his mitts, but no longer hitting or verbally threatening. Will continue to monitor and will attempt to replace NG tube again.

## 2018-05-09 NOTE — Op Note (Signed)
Procedure Date:  05/08/2018  Pre-operative Diagnosis:  Bleeding duodenal ulcer  Post-operative Diagnosis:  Bleeding duodenal ulcer, duodenal stricture  Procedure:  Right IJ central line placement, exploratory Laparotomy, EDG, duodenal stricture biopsy, 3 vessel ligation of duodenal bleeding ulcer, duodenoplasty, feeding jejunostomy  Surgeon:  Melvyn Neth, MD  Assistant:  Dr. Caroleen Hamman and Dr. Herbert Pun.  Their help was critical because of the complexity of this case, for appropriate exposure, and critical thinking during the case.  Anesthesia:  General endotracheal  Estimated Blood Loss:  75 ml  Specimens:  Duodenal stricture biopsy  Complications:  None  Indications for Procedure:  This is a 84 y.o. male who presents with bleeding duodenal ulcer, which failed conservative management with EDG /bipolar cautery and endovascular embolization.  The risks of bleeding, abscess or infection, injury to surrounding structures, and need for further procedures were all discussed with the patient and his son and was willing to proceed.  Due to lack of PICC line for TPN, central line was also to be placed in the OR.  Description of Procedure: The patient was correctly identified in the preoperative area and brought into the operating room.  The patient was placed supine with VTE prophylaxis in place.  Appropriate time-outs were performed.  Anesthesia was induced and the patient was intubated.  Foley catheter had already been placed.  Appropriate antibiotics were infused.  The procedure started with placement of a right IJ central line.  Using ultrasound guidance, the right IJ vein was localized.  Using seldinger technique, a triple lumen central line was placed and secured to the skin without complications.  All ports were appropriately flushed. The catheter was secured to the skin with 2-0 silk suture.  BioPatch was placed and dressed with tegaderm.  The abdomen was prepped and  draped in a sterile fashion.  A midline incision was made and electrocautery was used to dissect down the subcutaneous tissue to the fascia.  The fascia was incised and extended superiorly and inferiorly.  Kocher maneuver was performed to mobilize the duodenum.  In doing so, it was noted he had a small contained perforation over the anterior wall of the junction between 1st and 2nd portion of duodenum.  This was primarily repaired using 3-0 silk.  It was noted that the area between the 1st and 2nd portion of the duodenum was inflamed and hardened.  On palpation, he was noted to have a circumferential thickening.  It was unclear if this could be a mass or not, so we opted to do an EGD intraop to further evaluate.  The scope was able to traverse the stricture.  No bleeding was noted and no mass was noted.  A longitudinal duodenotomy was performed going from 1st to 2nd portion of duodenum.  The stricture was biopsied.  The area where the [place] is located was identified and a 3-vessel ligation was performed with 3-0 Silk suture.  Following that, the duodenum was closed transversely doing a duodenoplasty of the stricture.  After closure, a tongue of omentum was split and placed over the repair.  A 19 Fr. Blake drain was placed going from right abdomen towards the right upper quadrant, overlying our duodenoplasty.  The small bowel was run to check for any other areas of bleeding.  A loop of bowel about 20-30 cm distal to ligament of Treitz was selected for feeding jejunostomy.  A small enterotomy was created in the antimesenteric border and a 19 Fr. Red rubber catheter was introduced through  the skin and into the bowel.  The catheter was secured via witzel method.  The bowel was then tacked to the anterior abdominal wall using four 3-0 silk sutures over 4 corners.    50 ml of Exparel solution was infiltrated into the peritoneum, fascia, and subcutaneous tissue.  The abdomen was then closed using #1 PDS sutures for the  fascia and staples for the skin.  The feeding tube and Blake drain were secured using 2-0 Nylon sutures.  The wound was cleaned and dressed with Honeycomb dressing.  The drain and feeding tube were dressed with 4x4 gauze and tegaderm.  The patient was emerged from anesthesia and extubated and brought to the recovery room for further management.  The patient tolerated the procedure well and all counts were correct at the end of the case.   Melvyn Neth, MD

## 2018-05-09 NOTE — Consult Note (Addendum)
[place] NOTE   Pharmacy Consult for TPN Indication: GI bleed, long term NPO  Patient Measurements: Height: 5\' 5"  (165.1 cm) Weight: 145 lb 14.4 oz (66.2 kg) IBW/kg (Calculated) : 61.5 TPN AdjBW (KG): 56.1 Body mass index is 24.28 kg/m. Usual Weight: 150lbs  Assessment:  Pt with inadequate intake since admit. Pt remains NPO with continued GI bleed and now new ileus. Plan is for PICC line and TPN today. Per surgery note, plan is for ex lap later today.   Addendum - PICC line unsuccessful - changing to Central line   GI:  Current diet NPO, pantoprazole, LBM 4/30 Endo:  Insulin requirements in the past 24 hours: None Lytes: pharmacy on consult - stable x 2 days, LR @ 100 mL/hr Renal: CrCl 28.5 ml/min Cards: Carvedilol  Hepatobil: Neuro: ID: Norovirus +, Invanz for surgical prophylaxis  TPN Access: Central line TPN start date: 5/1  Nutritional Goals (per RD recommendation on 5/1): KCal: 1700-1900 kcal/day Protein: 85-95g/day Fluid: 1.5-1.7L/day   Plan:  Will continue Clinimix 5/15 with electrolytes, advance to goal rate of 24ml/hr.  Will continue 20% lipids at 57ml/hr x 12 hours per day.  Will add MVI and trace elements daily.   Will add IV thiamine 100mg  daily.   Pt at high refeeding risk; will check P, K, and Mg daily for 3 days after TPN initiation per pharmacy consult.  Will keep total IVF + TPN rate at 165ml/hr per MD.  Goal TPN rate is 80 ml/hr   Regimen @ goal rate of 79ml/hr will provide 1843kcal/day, 96g/day protein, 2112ml volume    Olivia Canter, [facility] Clinical Pharmacist 05/09/2018 8:25 AM

## 2018-05-09 NOTE — Progress Notes (Addendum)
Pt pulled out NG tube. Replaced NG tube at 50 cm.  XRY done and spoke with Dr Hampton Abbot who reviewed the xry.  Advanced tube to 60 and taped with y dressing.  Mittens placed on pt.  Pt with increased confusion from earlier in shift.  Will continue to monitor. Dorna Bloom RN

## 2018-05-09 NOTE — Progress Notes (Signed)
   05/09/18 0837  PT Visit Information  Reason Eval/Treat Not Completed Pain limiting ability to participate;Fatigue/lethargy limiting ability to participate (Patient refused; stating, "I don't feel well. I don't want to get up today. Please come back tomorrow." )  Will re-attempt when patient feeling better; RN just provided pain meds, will re-attempt when they have kicked in to provide relief. Thank you for this referral. Hillis Range, PT, DPT 05/09/18 8:42 AM

## 2018-05-09 NOTE — Progress Notes (Signed)
[place] [facility] [place] NAME: Justin Andrews    MR#:  [id number]  DATE OF BIRTH:  1934-04-30  SUBJECTIVE:  CHIEF COMPLAINT:   Chief Complaint  Patient presents with  . Fall  . Cold Exposure   The patient complains of mild abdominal pain.  No passing gas or bowel movement.  S/p duodenoplasty, feeding jejunostomy, on NGT with TPN now. REVIEW OF SYSTEMS:  CONSTITUTIONAL: No fever, fatigue or weakness.  EYES: No blurred or double vision.  EARS, NOSE, AND THROAT: No tinnitus or ear pain.  RESPIRATORY: No cough, shortness of breath, wheezing or hemoptysis.  CARDIOVASCULAR: No chest pain, orthopnea, edema.  GASTROINTESTINAL: No nausea, vomiting, diarrhea, but has abdominal pain.  GENITOURINARY: No dysuria, hematuria.  ENDOCRINE: No polyuria, nocturia,  HEMATOLOGY: No anemia, easy bruising or bleeding SKIN: No rash or lesion. MUSCULOSKELETAL: No joint pain or arthritis.   NEUROLOGIC: No tingling, numbness, weakness.  PSYCHIATRY: No anxiety or depression.   ROS  DRUG ALLERGIES:   Allergies  Allergen Reactions  . Aspirin Other (See Comments)    bleeding    VITALS:  Blood pressure 132/74, pulse 83, temperature 98.2 F (36.8 C), resp. rate 20, height 5\' 5"  (1.651 m), weight 66.2 kg, SpO2 93 %.  PHYSICAL EXAMINATION:   GENERAL:  84 y.o.-year-old patient lying in the bed with  ill looking, in no acute distress. EYES: Pupils equal, round, reactive to light and accommodation. No scleral icterus. Extraocular muscles intact.  Conjunctive are pale. HEENT: Head atraumatic, normocephalic. Oropharynx and nasopharynx clear.  NECK:  Supple, no jugular venous distention. No thyroid enlargement, no tenderness.  LUNGS: Normal breath sounds bilaterally, no wheezing, rales,rhonchi or crepitation. No use of accessory muscles of respiration.  CARDIOVASCULAR: S1, S2 normal. No murmurs, rubs, or gallops.  ABDOMEN: Soft, tenderness, nondistended. Bowel sounds is  absent. Abdominal drainage tube in situ with pink fluid. EXTREMITIES: No pedal edema, cyanosis, or clubbing.  NEUROLOGIC: Cranial nerves II through XII are intact. Muscle strength 3-4/5 in all extremities. Sensation intact. Gait not checked.  PSYCHIATRIC: The patient is alert and oriented x 3.  SKIN: No obvious rash, lesion, or ulcer.   Physical Exam LABORATORY PANEL:   CBC Recent Labs  Lab 05/09/18 0542  WBC 23.6*  HGB 9.3*  HCT 28.8*  PLT 306   ------------------------------------------------------------------------------------------------------------------  Chemistries  Recent Labs  Lab 05/09/18 0542  NA 138  K 4.3  CL 109  CO2 20*  GLUCOSE 227*  BUN 24*  CREATININE 1.74*  CALCIUM 7.3*  MG 2.0  AST 30  ALT 15  ALKPHOS 67  BILITOT 0.6   ------------------------------------------------------------------------------------------------------------------  Cardiac Enzymes Recent Labs  Lab 05/06/18 0723 05/06/18 1321  TROPONINI 0.08* 0.08*   ------------------------------------------------------------------------------------------------------------------  RADIOLOGY:  Dg Chest 1 View  Result Date: 05/09/2018 CLINICAL DATA:  Central line and enteric tube placement. EXAM: CHEST  1 VIEW COMPARISON:  Chest x-ray dated May 02, 2018. FINDINGS: Interval placement of a right internal jugular central venous catheter with the tip in the mid SVC. Enteric tube tip just inside the stomach with the proximal side port in the lower esophagus. The heart size and mediastinal contours are within normal limits. Atherosclerotic calcification of the aortic arch. Normal pulmonary vascularity. Hazy bibasilar opacities likely represent atelectasis. No pneumothorax or pleural effusion. No acute osseous abnormality. Unchanged partially visualized ileus. Embolization coils in the abdomen again noted. IMPRESSION: 1. Enteric tube tip just inside stomach with the proximal side port in the lower  esophagus. Recommend advancing 8 cm. 2. Right internal jugular central venous catheter tip in the SVC. No pneumothorax. 3. Bibasilar atelectasis. Electronically Signed   By: Titus Dubin M.D.   On: 05/09/2018 04:12   [place] Ekg Site Rite  Result Date: 05/08/2018 If Site Rite image not attached, placement could not be confirmed due to current cardiac rhythm.   ASSESSMENT AND PLAN:   Principal Problem:   GI bleed Active Problems:   Hemorrhagic shock (HCC)   Altered mental status   Anemia due to blood loss, acute  *Hemorrhagic shock Acute blood loss anemia GI bleed Hypothermia  After 4 units PRBC and FFP of transfusion hemoglobin stabilized,  Keep n.p.o. and continue IV fluid. Continue IV PPI twice daily Blood pressure is stable with IV fluid and blood transfusion. GI had suggested to do nuclear medicine bleeding scan which showed some active bleed in the rectal area. EGD was done which showed some bleed from duodenal area- cauterized.  Seems to patient had some bleed so  vascular surgeon did embolization.   Hemoglobin dropped again so given 1 unit of PRBC and taken to the OR for exploratory laparotomy. He is s/p duodenoplasty and jejunostomy. He is n.p.o. with TPN.  Follow-up electrolytes. Per Dr. Hampton Abbot, TPN for now, may change to jejunal tube feeding this Monday. Hemoglobin up to 9.7 after blood transfusion.  Leukocytosis.  Possible due to surgery.  Follow-up CBC.  *Acute renal failure on CKD stage III This could be due to acute blood loss. Continue to monitor with IV fluids.  Electrolytes are currently under control   *Hypertension We will hold medication with presentation of shock. Now blood pressure is stable so we will resume Coreg at home dose when patient resume diet.  *History of alcoholism as per son Continue vitamin supplements.  *C. difficile colitis ruled out- diarrhea due to norovirus. Patient had history of C. difficile in recent past. WBC count is high  currently.  started on oral Vanco and IV Flagyl. His C. difficile antigen was positive but toxins were negative so stopped the C. difficile antibiotics.  C. difficile is ruled out. His diarrhea resolved.  His GI panel showed norovirus positive .  I discussed with Dr. Hampton Abbot. All the records are reviewed and case discussed with Care Management/Social Workerr. Management plans discussed with the patient, family and they are in agreement.  CODE STATUS: DNR  TOTAL TIME TAKING CARE OF THIS PATIENT: 35 minutes.  Need physical therapy evaluation.  POSSIBLE D/C IN 1-2 DAYS, DEPENDING ON CLINICAL CONDITION. Son was worried about him living alone so requested to have placement in some kind of group home facility.  Case manager was looking into that.  But now with more extensive surgeries we will have to wait for repeat physical therapy evaluation.  Demetrios Loll M.D on 05/09/2018   Between 7am to 6pm - Pager - [phone]  After 6pm go to [email or web address] - password [email or web address] [facility]  Office  [phone]  CC: Primary care physician; Leone Haven, MD  Note: This dictation was prepared with Dragon dictation along with smaller phrase technology. Any transcriptional errors that result from this process are unintentional.

## 2018-05-10 LAB — CBC WITH DIFFERENTIAL/PLATELET
Abs Immature Granulocytes: 0.32 10*3/uL — ABNORMAL HIGH (ref 0.00–0.07)
Basophils Absolute: 0 10*3/uL (ref 0.0–0.1)
Basophils Relative: 0 %
Eosinophils Absolute: 0.2 10*3/uL (ref 0.0–0.5)
Eosinophils Relative: 1 %
HCT: 25.9 % — ABNORMAL LOW (ref 39.0–52.0)
Hemoglobin: 8.2 g/dL — ABNORMAL LOW (ref 13.0–17.0)
Immature Granulocytes: 2 %
Lymphocytes Relative: 5 %
Lymphs Abs: 1 10*3/uL (ref 0.7–4.0)
MCH: 29.7 pg (ref 26.0–34.0)
MCHC: 31.7 g/dL (ref 30.0–36.0)
MCV: 93.8 fL (ref 80.0–100.0)
Monocytes Absolute: 1.2 10*3/uL — ABNORMAL HIGH (ref 0.1–1.0)
Monocytes Relative: 6 %
Neutro Abs: 17.9 10*3/uL — ABNORMAL HIGH (ref 1.7–7.7)
Neutrophils Relative %: 86 %
Platelets: 285 10*3/uL (ref 150–400)
RBC: 2.76 MIL/uL — ABNORMAL LOW (ref 4.22–5.81)
RDW: 16.6 % — ABNORMAL HIGH (ref 11.5–15.5)
WBC: 20.7 10*3/uL — ABNORMAL HIGH (ref 4.0–10.5)
nRBC: 0 % (ref 0.0–0.2)

## 2018-05-10 LAB — BASIC METABOLIC PANEL
Anion gap: 7 (ref 5–15)
BUN: 35 mg/dL — ABNORMAL HIGH (ref 8–23)
CO2: 22 mmol/L (ref 22–32)
Calcium: 7.3 mg/dL — ABNORMAL LOW (ref 8.9–10.3)
Chloride: 111 mmol/L (ref 98–111)
Creatinine, Ser: 1.89 mg/dL — ABNORMAL HIGH (ref 0.61–1.24)
GFR calc Af Amer: 37 mL/min — ABNORMAL LOW (ref >60)
GFR calc non Af Amer: 32 mL/min — ABNORMAL LOW (ref >60)
Glucose, Bld: 188 mg/dL — ABNORMAL HIGH (ref 70–99)
Potassium: 3.9 mmol/L (ref 3.5–5.1)
Sodium: 140 mmol/L (ref 135–145)

## 2018-05-10 LAB — GLUCOSE, CAPILLARY
Glucose-Capillary: 162 mg/dL — ABNORMAL HIGH (ref 70–99)
Glucose-Capillary: 171 mg/dL — ABNORMAL HIGH (ref 70–99)
Glucose-Capillary: 181 mg/dL — ABNORMAL HIGH (ref 70–99)
Glucose-Capillary: 204 mg/dL — ABNORMAL HIGH (ref 70–99)

## 2018-05-10 LAB — PHOSPHORUS: Phosphorus: 2.8 mg/dL (ref 2.5–4.6)

## 2018-05-10 LAB — MAGNESIUM: Magnesium: 2.1 mg/dL (ref 1.7–2.4)

## 2018-05-10 MED ORDER — FAT EMULSION PLANT BASED 20 % IV EMUL
250.0000 mL | INTRAVENOUS | Status: AC
  Administered 2018-05-10: 250 mL via INTRAVENOUS
  Filled 2018-05-10: qty 250

## 2018-05-10 MED ORDER — MORPHINE SULFATE (PF) 2 MG/ML IV SOLN
2.0000 mg | INTRAVENOUS | Status: DC | PRN
  Administered 2018-05-10 – 2018-05-20 (×19): 2 mg via INTRAVENOUS
  Filled 2018-05-10 (×19): qty 1

## 2018-05-10 MED ORDER — HYDROMORPHONE HCL 1 MG/ML IJ SOLN
0.5000 mg | INTRAMUSCULAR | Status: DC | PRN
  Administered 2018-05-10 – 2018-05-15 (×10): 0.5 mg via INTRAVENOUS
  Filled 2018-05-10 (×11): qty 1

## 2018-05-10 MED ORDER — TRACE MINERALS CR-CU-MN-SE-ZN 10-1000-500-60 MCG/ML IV SOLN
INTRAVENOUS | Status: AC
  Administered 2018-05-10: 18:00:00 via INTRAVENOUS
  Filled 2018-05-10: qty 1920

## 2018-05-10 MED ORDER — HALOPERIDOL LACTATE 5 MG/ML IJ SOLN
1.0000 mg | Freq: Four times a day (QID) | INTRAMUSCULAR | Status: DC | PRN
  Administered 2018-05-10 – 2018-05-13 (×5): 1 mg via INTRAVENOUS
  Filled 2018-05-10 (×5): qty 1

## 2018-05-10 NOTE — Progress Notes (Signed)
PT Cancellation Note  Patient Details Name: Justin Andrews MRN: [id number] DOB: 19-Sep-1934   Cancelled Treatment:    Reason Eval/Treat Not Completed: Patient's level of consciousness(Per [facility], pt remains agitated this morning, unable to participate with medical care. WIll hold treatment this date, continue to follow acutely. Will resume PT treatment once patient is appropriate.  ) 11:06 AM, 05/10/18 Etta Grandchild, PT, DPT Physical Therapist - [facility]  [phone] (Meta)     Alzada C 05/10/2018, 11:06 AM

## 2018-05-10 NOTE — Progress Notes (Signed)
[facility] NAME: Mehran Guderian    MR#:  [id number]  DATE OF BIRTH:  May 11, 1934  SUBJECTIVE:  CHIEF COMPLAINT:   Chief Complaint  Patient presents with  . Fall  . Cold Exposure   The patient was agitated last night, given Haldol and on one-to-one sitter, on NGT with TPN. REVIEW OF SYSTEMS:  CONSTITUTIONAL: No fever, fatigue or weakness.  EYES: No blurred or double vision.  EARS, NOSE, AND THROAT: No tinnitus or ear pain.  RESPIRATORY: No cough, shortness of breath, wheezing or hemoptysis.  CARDIOVASCULAR: No chest pain, orthopnea, edema.  GASTROINTESTINAL: No nausea, vomiting, diarrhea, but has abdominal pain.  GENITOURINARY: No dysuria, hematuria.  ENDOCRINE: No polyuria, nocturia,  HEMATOLOGY: No anemia, easy bruising or bleeding SKIN: No rash or lesion. MUSCULOSKELETAL: No joint pain or arthritis.   NEUROLOGIC: No tingling, numbness, weakness.  PSYCHIATRY: No anxiety or depression.   ROS the patient was agitated and was given Haldol, he was sleeping.  DRUG ALLERGIES:   Allergies  Allergen Reactions  . Aspirin Other (See Comments)    bleeding    VITALS:  Blood pressure (!) 143/76, pulse 60, temperature (!) 97 F (36.1 C), temperature source Axillary, resp. rate 16, height 5\' 5"  (1.651 m), weight 67 kg, SpO2 92 %.  PHYSICAL EXAMINATION:   GENERAL:  84 y.o.-year-old patient lying in the bed with  ill looking, in no acute distress. EYES: Pupils equal, round, reactive to light and accommodation. No scleral icterus. Extraocular muscles intact.  Conjunctive are pale. HEENT: Head atraumatic, normocephalic. NECK:  Supple, no jugular venous distention. No thyroid enlargement, no tenderness.  LUNGS: Normal breath sounds bilaterally, no wheezing, rales,rhonchi or crepitation. No use of accessory muscles of respiration.  CARDIOVASCULAR: S1, S2 normal. No murmurs, rubs, or gallops.  ABDOMEN: Soft, tenderness, nondistended. Bowel  sounds is absent. Abdominal drainage tube in situ with pink fluid. EXTREMITIES: No pedal edema, cyanosis, or clubbing.  NEUROLOGIC: The patient was sleeping, unable to exam. PSYCHIATRIC: The patient was sleeping. SKIN: No obvious rash, lesion, or ulcer.   Physical Exam LABORATORY PANEL:   CBC Recent Labs  Lab 05/10/18 0445  WBC 20.7*  HGB 8.2*  HCT 25.9*  PLT 285   ------------------------------------------------------------------------------------------------------------------  Chemistries  Recent Labs  Lab 05/09/18 0542 05/10/18 0445  NA 138 140  K 4.3 3.9  CL 109 111  CO2 20* 22  GLUCOSE 227* 188*  BUN 24* 35*  CREATININE 1.74* 1.89*  CALCIUM 7.3* 7.3*  MG 2.0 2.1  AST 30  --   ALT 15  --   ALKPHOS 67  --   BILITOT 0.6  --    ------------------------------------------------------------------------------------------------------------------  Cardiac Enzymes Recent Labs  Lab 05/06/18 0723 05/06/18 1321  TROPONINI 0.08* 0.08*   ------------------------------------------------------------------------------------------------------------------  RADIOLOGY:  Dg Chest 1 View  Result Date: 05/09/2018 CLINICAL DATA:  Central line and enteric tube placement. EXAM: CHEST  1 VIEW COMPARISON:  Chest x-ray dated May 02, 2018. FINDINGS: Interval placement of a right internal jugular central venous catheter with the tip in the mid SVC. Enteric tube tip just inside the stomach with the proximal side port in the lower esophagus. The heart size and mediastinal contours are within normal limits. Atherosclerotic calcification of the aortic arch. Normal pulmonary vascularity. Hazy bibasilar opacities likely represent atelectasis. No pneumothorax or pleural effusion. No acute osseous abnormality. Unchanged partially visualized ileus. Embolization coils in the abdomen again noted. IMPRESSION: 1. Enteric tube tip just inside stomach  with the proximal side port in the lower esophagus.  Recommend advancing 8 cm. 2. Right internal jugular central venous catheter tip in the SVC. No pneumothorax. 3. Bibasilar atelectasis. Electronically Signed   By: Titus Dubin M.D.   On: 05/09/2018 04:12   Dg Abd 1 View  Result Date: 05/10/2018 CLINICAL DATA:  NG tube EXAM: ABDOMEN - 1 VIEW COMPARISON:  05/06/2018 FINDINGS: NG tube has been placed with the tip in the proximal stomach. The side port is near the GE junction. Surgical drains project over the abdomen and pelvis. Skin staples noted in the midline. IMPRESSION: NG tube tip in the proximal stomach. Electronically Signed   By: Rolm Baptise M.D.   On: 05/10/2018 00:26    ASSESSMENT AND PLAN:   Principal Problem:   GI bleed Active Problems:   Hemorrhagic shock (HCC)   Altered mental status   Anemia due to blood loss, acute  *Hemorrhagic shock Acute blood loss anemia GI bleed Hypothermia  After 4 units PRBC and FFP of transfusion hemoglobin stabilized,  Keep n.p.o. and continue IV fluid. Continue IV PPI twice daily Blood pressure is stable with IV fluid and blood transfusion. GI had suggested to do nuclear medicine bleeding scan which showed some active bleed in the rectal area. EGD was done which showed some bleed from duodenal area- cauterized.  Seems to patient had some bleed so  vascular surgeon did embolization.   Hemoglobin dropped again so given 1 unit of PRBC and taken to the OR for exploratory laparotomy. He is s/p duodenoplasty and jejunostomy. He is n.p.o. with TPN.  Follow-up electrolytes. Per Dr. Hampton Abbot, TPN for now, may change to jejunal tube feeding this Monday. Hemoglobin up to 9.7 after blood transfusion. Hemoglobin is 8.2.  Leukocytosis.  Possible due to surgery.  Follow-up CBC.  *Acute renal failure on CKD stage III This could be due to acute blood loss. Continue IV fluid and follow-up BMP.  Electrolytes are normal.  *Hypertension Blood pressure is stable, will resume Coreg at home dose when  patient resume diet.  *History of alcoholism as per son Continue vitamin supplements.  *C. difficile colitis ruled out- diarrhea due to norovirus. Patient had history of C. difficile in recent past. WBC count is high currently.  started on oral Vanco and IV Flagyl. His C. difficile antigen was positive but toxins were negative so stopped the C. difficile antibiotics.  C. difficile is ruled out. His diarrhea resolved.  His GI panel showed norovirus positive .  Agitation.  Haldol as needed, one-to-one sitter for now.  I discussed with Dr. Hampton Abbot. All the records are reviewed and case discussed with Care Management/Social Workerr. Management plans discussed with the patient, his son and they are in agreement.  CODE STATUS: DNR  TOTAL TIME TAKING CARE OF THIS PATIENT: 35 minutes.   POSSIBLE D/C IN 3 DAYS, DEPENDING ON CLINICAL CONDITION. Son was worried about him living alone so requested to have placement in some kind of group home facility.  Case manager was looking into that.  But now with more extensive surgeries we will have to wait for repeat physical therapy evaluation.  Demetrios Loll M.D on 05/10/2018   Between 7am to 6pm - Pager - [phone]  After 6pm go to [email or web address] - password [email or web address] [facility]  Office  [phone]  CC: Primary care physician; Leone Haven, MD  Note: This dictation was prepared with Dragon dictation along with smaller phrase technology. Any transcriptional errors that result from  this process are unintentional.

## 2018-05-10 NOTE — Consult Note (Signed)
[facility] NOTE   Pharmacy Consult for TPN Indication: GI bleed, long term NPO  Patient Measurements: Height: 5\' 5"  (165.1 cm) Weight: 147 lb 11.3 oz (67 kg) IBW/kg (Calculated) : 61.5 TPN AdjBW (KG): 56.1 Body mass index is 24.58 kg/m. Usual Weight: 150lbs  Assessment:  Pt with inadequate intake since admit. Pt remains NPO with continued GI bleed and now new ileus.  Addendum - PICC line unsuccessful - changing to Central line   GI:  Current diet NPO, pantoprazole, LBM 4/30 Endo:  Insulin requirements in the past 24 hours: None Lytes: pharmacy on consult - stable x 2 days, LR @ 100 mL/hr Renal: CrCl 28.5 ml/min Cards: Carvedilol  Hepatobil: Neuro: ID: Norovirus +, Invanz for surgical prophylaxis  TPN Access: Central line TPN start date: 5/1  Nutritional Goals (per RD recommendation on 5/1): KCal: 1700-1900 kcal/day Protein: 85-95g/day Fluid: 1.5-1.7L/day  Plan:  Will continue Clinimix 5/15 with electrolytes at goal rate of 76ml/hr.  Will continue 20% lipids at 36ml/hr x 12 hours per day.  Will add MVI and trace elements daily.   Will add IV thiamine 100mg  daily.   Pt at high refeeding risk; will check P, K, and Mg daily for 3 days after TPN initiation per pharmacy consult.  Will keep total IVF + TPN rate at 183ml/hr per MD.  Goal TPN rate is 80 ml/hr   Regimen @ goal rate of 33ml/hr will provide 1843kcal/day, 96g/day protein, 2151ml volume    Olivia Canter, [facility] Clinical Pharmacist 05/10/2018 8:28 AM

## 2018-05-10 NOTE — Progress Notes (Signed)
Changed IJ and drainage bulb dressings per Dr. Hampton Abbot.

## 2018-05-10 NOTE — Progress Notes (Signed)
05/10/2018  Subjective: Patient is 2 Days Post-Op status post exploratory laparotomy with repair of duodenal bleeding ulcer, EGD, duodenal stricture biopsy, jejunostomy feeding tube, central line placement.  Patient pulled his NG tube again last night which was replaced and then get again this morning and he refuses to have the NG tube placed again.  Denies any abdominal pain and his vitals have remained normal but his hemoglobin did decrease between yesterday and today from 9.3-8.2.  Per nurse, his bowel movement yesterday did show some streaks of dark blood but not frank blood like it had been before surgery.  Vital signs: Temp:  [97 F (36.1 C)-98.4 F (36.9 C)] 97 F (36.1 C) (05/03 0456) Pulse Rate:  [60-90] 84 (05/03 1318) Resp:  [10-21] 16 (05/03 0456) BP: (111-143)/(57-81) 122/60 (05/03 1318) SpO2:  [92 %-94 %] 92 % (05/03 0456) Weight:  [67 kg] 67 kg (05/03 0456)   Intake/Output: 05/02 0701 - 05/03 0700 In: 2305.8 [I.V.:2205.8; IV Piggyback:100] Out: 1000 [Urine:750; Emesis/NG output:100; Drains:150] Last BM Date: 05/10/18  Physical Exam: Constitutional: No acute distress Abdomen: Soft, nondistended, appropriately tender to palpation.  Midline incision is clean dry and intact with staples in place.  Right-sided JP drain with serosanguineous fluid only.  Left-sided feeding tube with no purulent drainage around it.   Labs:  Recent Labs    05/09/18 0542 05/10/18 0445  WBC 23.6* 20.7*  HGB 9.3* 8.2*  HCT 28.8* 25.9*  PLT 306 285   Recent Labs    05/09/18 0542 05/10/18 0445  NA 138 140  K 4.3 3.9  CL 109 111  CO2 20* 22  GLUCOSE 227* 188*  BUN 24* 35*  CREATININE 1.74* 1.89*  CALCIUM 7.3* 7.3*   Recent Labs    05/08/18 1325  LABPROT 14.0  INR 1.1    Imaging: Dg Abd 1 View  Result Date: 05/10/2018 CLINICAL DATA:  NG tube EXAM: ABDOMEN - 1 VIEW COMPARISON:  05/06/2018 FINDINGS: NG tube has been placed with the tip in the proximal stomach. The side port is  near the GE junction. Surgical drains project over the abdomen and pelvis. Skin staples noted in the midline. IMPRESSION: NG tube tip in the proximal stomach. Electronically Signed   By: Rolm Baptise M.D.   On: 05/10/2018 00:26    Assessment/Plan: This is a 84 y.o. male s/p exlap with repair of duodenal bleeding ulcer, EGD, duodenal stricture biopsy, jejunostomy feeding tube, central line placement.  - Since the patient is currently refusing to replace NG tube, we will leave the tube out but will remain strictly n.p.o.  Tomorrow we will plan on doing an upper GI study to evaluate the duodenal repair to make sure there is no leak.  JP remained serosanguineous which is reassuring. - Continue IV antibiotic, IV fluid hydration, and monitor for delirium since he has been agitated and pulling at his NG tube and IV lines.   Melvyn Neth, MD [facility]`

## 2018-05-10 NOTE — Progress Notes (Signed)
OT Cancellation Note  Patient Details Name: Justin Andrews MRN: [id number] DOB: 29-Jun-1934   Cancelled Treatment:    Reason Eval/Treat Not Completed: Other (comment) Per [facility], pt remains agitated, unable to participate with medical care. WIll hold treatment this date, continue to follow acutely. Will resume OT treatment once patient is appropriate.  Shara Blazing, M.S., OTR/L Ascom: [phone] 05/10/18, 12:34 PM

## 2018-05-10 NOTE — Progress Notes (Signed)
Pt calm and being reoriented by nurse and sitter, suction reattached to low intermittent suction and flowing dark green fluid. VS stable. Will continue to monitor.

## 2018-05-10 NOTE — Progress Notes (Signed)
Patient pulled peripheral IV last night. IV team here this morning to put in new line but patient was combative and they were unable.

## 2018-05-10 NOTE — Progress Notes (Signed)
Patient pulled NG tube. Advised Dr. Hampton Abbot that patient is refusing.  No new orders given.

## 2018-05-11 ENCOUNTER — Inpatient Hospital Stay: Payer: Medicare HMO

## 2018-05-11 LAB — CBC
HCT: 24.1 % — ABNORMAL LOW (ref 39.0–52.0)
Hemoglobin: 7.7 g/dL — ABNORMAL LOW (ref 13.0–17.0)
MCH: 30.1 pg (ref 26.0–34.0)
MCHC: 32 g/dL (ref 30.0–36.0)
MCV: 94.1 fL (ref 80.0–100.0)
Platelets: 272 10*3/uL (ref 150–400)
RBC: 2.56 MIL/uL — ABNORMAL LOW (ref 4.22–5.81)
RDW: 16.1 % — ABNORMAL HIGH (ref 11.5–15.5)
WBC: 19.2 10*3/uL — ABNORMAL HIGH (ref 4.0–10.5)
nRBC: 0 % (ref 0.0–0.2)

## 2018-05-11 LAB — DIFFERENTIAL
Abs Immature Granulocytes: 0.18 10*3/uL — ABNORMAL HIGH (ref 0.00–0.07)
Basophils Absolute: 0 10*3/uL (ref 0.0–0.1)
Basophils Relative: 0 %
Eosinophils Absolute: 0.5 10*3/uL (ref 0.0–0.5)
Eosinophils Relative: 3 %
Immature Granulocytes: 1 %
Lymphocytes Relative: 7 %
Lymphs Abs: 1.3 10*3/uL (ref 0.7–4.0)
Monocytes Absolute: 1.1 10*3/uL — ABNORMAL HIGH (ref 0.1–1.0)
Monocytes Relative: 6 %
Neutro Abs: 16.1 10*3/uL — ABNORMAL HIGH (ref 1.7–7.7)
Neutrophils Relative %: 83 %

## 2018-05-11 LAB — COMPREHENSIVE METABOLIC PANEL
ALT: 14 U/L (ref 0–44)
AST: 19 U/L (ref 15–41)
Albumin: 1.9 g/dL — ABNORMAL LOW (ref 3.5–5.0)
Alkaline Phosphatase: 66 U/L (ref 38–126)
Anion gap: 6 (ref 5–15)
BUN: 41 mg/dL — ABNORMAL HIGH (ref 8–23)
CO2: 22 mmol/L (ref 22–32)
Calcium: 7.4 mg/dL — ABNORMAL LOW (ref 8.9–10.3)
Chloride: 111 mmol/L (ref 98–111)
Creatinine, Ser: 1.65 mg/dL — ABNORMAL HIGH (ref 0.61–1.24)
GFR calc Af Amer: 44 mL/min — ABNORMAL LOW (ref >60)
GFR calc non Af Amer: 38 mL/min — ABNORMAL LOW (ref >60)
Glucose, Bld: 139 mg/dL — ABNORMAL HIGH (ref 70–99)
Potassium: 4 mmol/L (ref 3.5–5.1)
Sodium: 139 mmol/L (ref 135–145)
Total Bilirubin: 0.5 mg/dL (ref 0.3–1.2)
Total Protein: 4.8 g/dL — ABNORMAL LOW (ref 6.5–8.1)

## 2018-05-11 LAB — PREALBUMIN: Prealbumin: 8.3 mg/dL — ABNORMAL LOW (ref 18–38)

## 2018-05-11 LAB — GLUCOSE, CAPILLARY
Glucose-Capillary: 148 mg/dL — ABNORMAL HIGH (ref 70–99)
Glucose-Capillary: 142 mg/dL — ABNORMAL HIGH (ref 70–99)
Glucose-Capillary: 149 mg/dL — ABNORMAL HIGH (ref 70–99)
Glucose-Capillary: 78 mg/dL (ref 70–99)

## 2018-05-11 LAB — TRIGLYCERIDES: Triglycerides: 95 mg/dL (ref <150)

## 2018-05-11 LAB — PHOSPHORUS: Phosphorus: 3.2 mg/dL (ref 2.5–4.6)

## 2018-05-11 LAB — MAGNESIUM: Magnesium: 2.2 mg/dL (ref 1.7–2.4)

## 2018-05-11 MED ORDER — DIATRIZOATE MEGLUMINE & SODIUM 66-10 % PO SOLN
30.0000 mL | Freq: Once | ORAL | Status: AC
  Administered 2018-05-11: 10:00:00 30 mL via ORAL

## 2018-05-11 MED ORDER — POTASSIUM CHLORIDE IN NACL 20-0.9 MEQ/L-% IV SOLN
INTRAVENOUS | Status: DC
  Administered 2018-05-11 – 2018-05-13 (×5): via INTRAVENOUS
  Filled 2018-05-11 (×7): qty 1000

## 2018-05-11 MED ORDER — TRACE MINERALS CR-CU-MN-SE-ZN 10-1000-500-60 MCG/ML IV SOLN
INTRAVENOUS | Status: AC
  Administered 2018-05-11: 18:00:00 via INTRAVENOUS
  Filled 2018-05-11: qty 960

## 2018-05-11 MED ORDER — AMLODIPINE BESYLATE 10 MG PO TABS
10.0000 mg | ORAL_TABLET | Freq: Every day | ORAL | Status: DC
  Administered 2018-05-11 – 2018-05-26 (×14): 10 mg via ORAL
  Filled 2018-05-11 (×15): qty 1

## 2018-05-11 MED ORDER — BOOST / RESOURCE BREEZE PO LIQD CUSTOM
1.0000 | Freq: Three times a day (TID) | ORAL | Status: DC
  Administered 2018-05-11 – 2018-05-17 (×14): 1 via ORAL

## 2018-05-11 MED ORDER — GUAIFENESIN 100 MG/5ML PO SOLN
5.0000 mL | ORAL | Status: DC | PRN
  Administered 2018-05-13: 13:00:00 100 mg via ORAL
  Filled 2018-05-11: qty 10

## 2018-05-11 MED ORDER — FAT EMULSION PLANT BASED 20 % IV EMUL
250.0000 mL | INTRAVENOUS | Status: AC
  Administered 2018-05-11: 250 mL via INTRAVENOUS
  Filled 2018-05-11: qty 250

## 2018-05-11 MED ORDER — PANTOPRAZOLE SODIUM 40 MG PO TBEC
40.0000 mg | DELAYED_RELEASE_TABLET | Freq: Two times a day (BID) | ORAL | Status: DC
  Administered 2018-05-11 – 2018-05-26 (×30): 40 mg via ORAL
  Filled 2018-05-11 (×31): qty 1

## 2018-05-11 MED ORDER — IPRATROPIUM-ALBUTEROL 0.5-2.5 (3) MG/3ML IN SOLN
3.0000 mL | Freq: Four times a day (QID) | RESPIRATORY_TRACT | Status: DC | PRN
  Administered 2018-05-11 – 2018-05-13 (×6): 3 mL via RESPIRATORY_TRACT
  Filled 2018-05-11 (×6): qty 3

## 2018-05-11 NOTE — Progress Notes (Signed)
Pt has been intermittently agitated, anxious. Pt has been in constant pain, very restless throughout the night with PRN pain meds given multiple times with some relief. VS stable. Will continue to monitor.

## 2018-05-11 NOTE — Progress Notes (Signed)
[facility] Day(s): 9.   Post op day(s): 3 Days Post-Op.   Interval History: Patient seen and examined, no acute events or new complaints overnight. Patient reports that he is feeling okay. No complaints of abdominal pain, nausea, or emesis. He does seem to have some wheezing and increased work of breathing this morning. Has remained NPO. Underwent UGI this morning. JP with 100 ccs out.   Review of Systems:  Constitutional: denies fever, chills  Respiratory: + wheezing Cardiovascular: denies chest pain or palpitations  Gastrointestinal: denies abdominal pain, N/V, or diarrhea/and bowel function as per interval history Integumentary: + Surgical wounds  Vital signs in last 24 hours: [min-max] current  Temp:  [97 F (36.1 C)-97.9 F (36.6 C)] 97 F (36.1 C) (05/04 1009) Pulse Rate:  [58-101] 58 (05/04 1009) Resp:  [16-24] 24 (05/04 1009) BP: (122-155)/(60-80) 154/69 (05/04 1009) SpO2:  [92 %-100 %] 92 % (05/04 1009) Weight:  [67.6 kg] 67.6 kg (05/04 0500)     Height: 5\' 5"  (165.1 cm) Weight: 67.6 kg BMI (Calculated): 24.8   Intake/Output this shift:  No intake/output data recorded.   Physical Exam:  Constitutional: alert, cooperative and no distress  Respiratory:appears more labored this morning, audible wheezing Cardiovascular: regular rate and sinus rhythm  Gastrointestinal: soft, non-tender, and non-distended. JP in RLQ with serosanguinous fluid in bulb. J-tube in left abdomen.  Integumentary: Laparotomy incision is CDI, no erythema or drainage   Labs:  CBC Latest Ref Rng & Units 05/11/2018 05/10/2018 05/09/2018  WBC 4.0 - 10.5 K/uL 19.2(H) 20.7(H) 23.6(H)  Hemoglobin 13.0 - 17.0 g/dL 7.7(L) 8.2(L) 9.3(L)  Hematocrit 39.0 - 52.0 % 24.1(L) 25.9(L) 28.8(L)  Platelets 150 - 400 K/uL 272 285 306   CMP Latest Ref Rng & Units 05/11/2018 05/10/2018 05/09/2018  Glucose 70 - 99 mg/dL 139(H) 188(H) 227(H)  BUN 8 - 23 mg/dL 41(H) 35(H) 24(H)   Creatinine 0.61 - 1.24 mg/dL 1.65(H) 1.89(H) 1.74(H)  Sodium 135 - 145 mmol/L 139 140 138  Potassium 3.5 - 5.1 mmol/L 4.0 3.9 4.3  Chloride 98 - 111 mmol/L 111 111 109  CO2 22 - 32 mmol/L 22 22 20(L)  Calcium 8.9 - 10.3 mg/dL 7.4(L) 7.3(L) 7.3(L)  Total Protein 6.5 - 8.1 g/dL 4.8(L) - 4.7(L)  Total Bilirubin 0.3 - 1.2 mg/dL 0.5 - 0.6  Alkaline Phos 38 - 126 U/L 66 - 67  AST 15 - 41 U/L 19 - 30  ALT 0 - 44 U/L 14 - 15     Imaging studies:   UGI (05/11/2018) personally reviewed with Dr Rosana Hoes and Radiologist report reviewed:  IMPRESSION: Examination is somewhat limited by patient ability to ingest oral contrast and water. The stomach is therefore underdistended with limited volume of contrast passage into the duodenum. Within this limitation, there is no evidence of duodenal leak. 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   Assessment/Plan: (ICD-10's: K66.9) 84 y.o. male with slightly improving leukocytosis and down trending hemoglobin which may be attributable to surgery + IVF dilution without ay signs of active re-bleeding otherwise doing well 3 Days Post-Op s/p exploratory Laparotomy, EDG, duodenal stricture biopsy, 3 vessel ligation of duodenal bleeding ulcer, duodenoplasty, feeding jejunostomy for bleeding duodenal ulcer   - Start CLD + Boost breeze, Wean TPN  - pain control prn; antiemetics prn  - IV Abx (Ertapenem); Day 4  - IV PPI  - Monitor abdominal pain;  on going bowel function  - Monitor leukocytosis  - Monitor hgb; transfuse as needed   - Discharge foley catheter  - Mobilization encouraged   - medical management otherwise per primary team   All of the above findings and recommendations were discussed with the patient, and the medical team, and all of patient's questions were answered to his expressed satisfaction.  -- Edison Simon, PA-C [facility] 05/11/2018,  10:24 AM [phone] M-F: 7am - 4pm

## 2018-05-11 NOTE — Plan of Care (Signed)
Pt has had multiple black loose stools today and his skin in the perineal area is red and excoriated.  We got order for rectal tube.  I premedicated him w/toradol for discomfort and when we attempted to insert patient became very agitated and combative.  He kicked the nurse tech and me.  Haldol was given.  Renewed order for safety sitter.  Pt had upper GI today and pt was started on clear liquid diet, but he has very poor appetite.  TPN and fat emulsion continues.  Pt also had tachycardia and irregular rhythm - we continued telemetry.  Dr. Bridgett Larsson set parameters for tele to notify us if HR > 130.

## 2018-05-11 NOTE — Care Management Important Message (Signed)
Important Message  Patient Details  Name: Justin Andrews MRN: [id number] Date of Birth: 10-11-1934   Medicare Important Message Given:  Yes    Su Hilt, RN 05/11/2018, 2:50 PM

## 2018-05-11 NOTE — Progress Notes (Signed)
[facility] NAME: Justin Andrews    MR#:  [id number]  DATE OF BIRTH:  [birth year]  SUBJECTIVE:  CHIEF COMPLAINT:   Chief Complaint  Patient presents with  . Fall  . Cold Exposure   The patient is awake, complaint of abdominal pain. He has diarrhea after starting CLD. REVIEW OF SYSTEMS:  CONSTITUTIONAL: No fever, fatigue or weakness.  EYES: No blurred or double vision.  EARS, NOSE, AND THROAT: No tinnitus or ear pain.  RESPIRATORY: No cough, shortness of breath, wheezing or hemoptysis.  CARDIOVASCULAR: No chest pain, orthopnea, edema.  GASTROINTESTINAL: No nausea, vomiting, diarrhea, but has abdominal pain.  GENITOURINARY: No dysuria, hematuria.  ENDOCRINE: No polyuria, nocturia,  HEMATOLOGY: No anemia, easy bruising or bleeding SKIN: No rash or lesion. MUSCULOSKELETAL: No joint pain or arthritis.   NEUROLOGIC: No tingling, numbness, weakness.  PSYCHIATRY: No anxiety or depression.   ROS   DRUG ALLERGIES:   Allergies  Allergen Reactions  . Aspirin Other (See Comments)    bleeding    VITALS:  Blood pressure (!) 145/117, pulse (!) 165, temperature (!) 97 F (36.1 C), resp. rate (!) 24, height 5\' 5"  (1.651 m), weight 67.6 kg, SpO2 92 %.  PHYSICAL EXAMINATION:   GENERAL:  84 y.o.-year-old patient lying in the bed with  ill looking, in no acute distress. EYES: Pupils equal, round, reactive to light and accommodation. No scleral icterus. Extraocular muscles intact.  Conjunctive are pale. HEENT: Head atraumatic, normocephalic. NECK:  Supple, no jugular venous distention. No thyroid enlargement, no tenderness.  LUNGS: Normal breath sounds bilaterally, mild wheezing, no rales,rhonchi or crepitation. No use of accessory muscles of respiration.  CARDIOVASCULAR: S1, S2 normal. No murmurs, rubs, or gallops.  ABDOMEN: Soft, tenderness, nondistended. Bowel sounds is absent. Abdominal drainage tube in situ with pink fluid. EXTREMITIES:  No pedal edema, cyanosis, or clubbing.  NEUROLOGIC: The patient was sleeping, unable to exam. PSYCHIATRIC: The patient was sleeping. SKIN: No obvious rash, lesion, or ulcer.   Physical Exam LABORATORY PANEL:   CBC Recent Labs  Lab 05/11/18 0454  WBC 19.2*  HGB 7.7*  HCT 24.1*  PLT 272   ------------------------------------------------------------------------------------------------------------------  Chemistries  Recent Labs  Lab 05/11/18 0454  NA 139  K 4.0  CL 111  CO2 22  GLUCOSE 139*  BUN 41*  CREATININE 1.65*  CALCIUM 7.4*  MG 2.2  AST 19  ALT 14  ALKPHOS 66  BILITOT 0.5   ------------------------------------------------------------------------------------------------------------------  Cardiac Enzymes Recent Labs  Lab 05/06/18 0723 05/06/18 1321  TROPONINI 0.08* 0.08*   ------------------------------------------------------------------------------------------------------------------  RADIOLOGY:  Dg Abd 1 View  Result Date: 05/10/2018 CLINICAL DATA:  NG tube EXAM: ABDOMEN - 1 VIEW COMPARISON:  05/06/2018 FINDINGS: NG tube has been placed with the tip in the proximal stomach. The side port is near the GE junction. Surgical drains project over the abdomen and pelvis. Skin staples noted in the midline. IMPRESSION: NG tube tip in the proximal stomach. Electronically Signed   By: Rolm Baptise M.D.   On: 05/10/2018 00:26   Dg Duanne Limerick W Single Cm (sol Or Thin Ba)  Result Date: 05/11/2018 CLINICAL DATA:  Status post duodenal ulcer surgical repair, reassess for leak EXAM: WATER SOLUBLE UPPER GI SERIES TECHNIQUE: Water-soluble fluoroscopic leak check was performed using water soluble contrast. CONTRAST:  Oral Gastrografin, additional water for volume COMPARISON:  CT abdomen pelvis, 05/02/2018, 12/09/2015 FLUOROSCOPY TIME:  Fluoroscopy Time:  2:24 Number of Acquired Spot Images: 0 FINDINGS: Examination  is somewhat limited by patient ability to ingest oral contrast and  water. The stomach is therefore underdistended with limited volume of contrast passage into the duodenum. Within this limitation, there is no evidence of duodenal leak. 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 Postoperative findings of midline laparotomy with surgical drains about the mid abdomen. IMPRESSION: Examination is somewhat limited by patient ability to ingest oral contrast and water. The stomach is therefore underdistended with limited volume of contrast passage into the duodenum. Within this limitation, there is no evidence of duodenal leak. 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 Electronically Signed   By: Eddie Candle M.D.   On: 05/11/2018 10:18    ASSESSMENT AND PLAN:   Principal Problem:   GI bleed Active Problems:   Hemorrhagic shock (HCC)   Altered mental status   Anemia due to blood loss, acute  *Hemorrhagic shock Acute blood loss anemia GI bleed Hypothermia  After 4 units PRBC and FFP of transfusion hemoglobin stabilized,  He was on n.p.o. and continue IV fluid. He is on IV PPI twice daily. Blood pressure is stable with IV fluid and blood transfusion. GI had suggested to do nuclear medicine bleeding scan which showed some active bleed in the rectal area. EGD was done which showed some bleed from duodenal area- cauterized.  Seems to patient had some bleed so  vascular surgeon did embolization.   Hemoglobin dropped again so given 1 unit of PRBC and taken to the OR for exploratory laparotomy. He is s/p duodenoplasty and jejunostomy. He is n.p.o. with TPN.  Follow-up electrolytes. Per Dr. Hampton Abbot, TPN for now, may change to jejunal tube feeding this Monday.  Hemoglobin up to 9.7 after blood transfusion, but down to 7.7 today. No active bleeding. Per surgeon, started CLD after upper GI test. Change to po  protonix bid.  Leukocytosis.  Possible due to surgery.  Follow-up CBC.  *Acute renal failure on CKD stage III This could be due to acute blood loss. Continue IV fluid and follow-up BMP.  Electrolytes are normal.  *Hypertension Resume norvasc.  *History of alcoholism as per son Continue vitamin supplements.  *C. difficile colitis ruled out- diarrhea due to norovirus. Patient had history of C. difficile in recent past. WBC count is high currently.  started on oral Vanco and IV Flagyl. His C. difficile antigen was positive but toxins were negative so stopped the C. difficile antibiotics.  C. difficile is ruled out. His diarrhea resolved.  His GI panel showed norovirus positive. Diarrhea after CLD today.  Agitation.  Haldol as needed, one-to-one sitter for now.  I discussed with surgical PA. All the records are reviewed and case discussed with Care Management/Social Workerr. Management plans discussed with the patient, his son and they are in agreement.  CODE STATUS: DNR  TOTAL TIME TAKING CARE OF THIS PATIENT: 32 minutes.   POSSIBLE D/C IN 3 DAYS, DEPENDING ON CLINICAL CONDITION. Son was worried about him living alone so requested to have placement in some kind of group home facility.  Case manager was looking into that.  But now with more extensive surgeries we will have to wait for repeat physical therapy evaluation.  Demetrios Loll M.D on 05/11/2018   Between 7am to 6pm - Pager - [phone]  After 6pm go to [email or web address] - password [email or web address]  [employer]  [phone]  CC: Primary care physician; Leone Haven, MD  Note: This dictation was prepared with Dragon dictation along with smaller phrase technology. Any transcriptional errors that result from this process are unintentional.

## 2018-05-11 NOTE — Progress Notes (Signed)
Occupational Therapy Treatment Patient Details Name: Justin Andrews MRN: [id number] DOB: 31-Jul-1934 Today's Date: 05/11/2018    History of present illness Justin Andrews is an 84 yo male who comes [facility] 4/25, found on the floor by neighbors minimally responsive in his stool/blood. Upon arrival, hypothermic, hypotensive on arrival and tachypneic, Hb: 3s. Per son, pt was advised to go to hospital by homehealth RN, but pt did not. Later afte rmore dark stools family called EMS, but pt refused to go to ED. PMH: C-diff, CKD, HTN, ETOH abuse, cigarrette use, Lt hip fracture Nov 2019 s/p ORIF and STR stay, recent fall with Right hip pain. Since admission pt is s/p multiple units PRBC and vascular procedure to address GI bleed.  Pt went back to OR 5/1 for ex-lab to address continued GIB.    OT comments  Pt seen for OT treatment on this date. Upon arrival to room pt awake/alert supine in bed with nsg sitter present. Pt A&O to self only and was able to identify that he was in a hospital. Otherwise, appeared to be somewhat limited by cognitive status. Pt also noted to have somewhat labored/wheezing breathing on this date. Pt requested to get to room recliner, but due to limited mobility with PT, OT asked if he was agreeable to sit in bed in chair position. Pt agreeable. With attempt to reposition in bed, OT noted pt to have had large volume of loose dark stool. Sitter notified. OT and nsg sitter assisted pt with clean-up and bed linen change. Pt instructed in bed mobility strategies for rolling in bed as well as provided with reinforcement of education for PLB. Pt able to return demonstrate PLB technique with heavy VC's from therapist. Pt continues to benefit from skilled OT services to maximize return to PLOF and minimize risk of future falls, injury, caregiver burden, and readmission. Pt noted to have had significant decline in functional status since time of evaluation. At this time requires max/total assist for  bed-level ADL tasks. Goals have been updated to reflect current pt status. Discharge recommendation also updated to STR in order to support optimal return of functional mobility and functional independence for improved safety and independence upon hospital DC.     Follow Up Recommendations  SNF;Supervision/Assistance - 24 hour    Equipment Recommendations  (TBD at next level of care. )    Recommendations for Other Services      Precautions / Restrictions Precautions Precautions: Fall Precaution Comments: Pt is anxious; has been mildly AMS and combative last several days. Sitter in room with pt at time of OT session.  Restrictions Weight Bearing Restrictions: No       Mobility Bed Mobility Overal bed mobility: Needs Assistance Bed Mobility: Rolling Rolling: Mod assist   Supine to sit: Supervision Sit to supine: Mod assist   General bed mobility comments: Pt required mod assist with VCs for hand placement during rolling for bed level toileting/clean up on this date.   Transfers Overall transfer level: Needs assistance Equipment used: 1 person hand held assist Transfers: Sit to/from Stand Sit to Stand: Min assist;Min guard         General transfer comment: Deferred for pt safety. Per PT report this pt able to perform STS with min A/min guard. Pt appears to have had significant decline in mobility since time of evaluation.     Balance Overall balance assessment: Needs assistance  ADL either performed or assessed with clinical judgement   ADL Overall ADL's : Needs assistance/impaired   Eating/Feeding Details (indicate cue type and reason): Pt previously NPO since time of eval. Per epic, removed NG tube multiple times. has recently been upgraded to clear liquids. Will continue to monitor self-feeding Grooming: Set up;Moderate assistance;Sitting   Upper Body Bathing: Bed level;Maximal assistance;Set up   Lower  Body Bathing: Bed level;Total assistance   Upper Body Dressing : Set up;Moderate assistance;Sitting   Lower Body Dressing: Set up;Maximal assistance   Toilet Transfer: Set up;BSC;RW;Stand-pivot   Toileting- Clothing Manipulation and Hygiene: Set up;Bed level;Maximal assistance         General ADL Comments: Pt appears to have had significant functional decline since time of evaluation. He appears generally weak and is pain limited. His level of assistance has increased to max/total assist for most ADLs.     Vision       Perception     Praxis      Cognition Arousal/Alertness: Awake/alert Behavior During Therapy: Anxious Overall Cognitive Status: Impaired/Different from baseline Area of Impairment: Orientation                 Orientation Level: Person;Place             General Comments: slightly more difficulty with situational awareness. Pt appeared oriented to self and limitedly oriented to place sated he was at "[facility]". Stated that the year was "2000".        Exercises Other Exercises Other Exercises: OT assisted pt with rolling in bed and toileting/clean up on this date.    Shoulder Instructions       General Comments Pt continues to make statements including "I'm ready to meet my maker" during OT session. OT utilized therapeutic use of self and active listening t/o session for emotional support and encouragement.     Pertinent Vitals/ Pain       Pain Score: 10-Worst pain ever Faces Pain Scale: Hurts little more Pain Location: Pt endorsed "20 out of 10" pain level at time of OT session. When asked about pain location pt gesutured toward his abdomen. Pain Descriptors / Indicators: Grimacing;Guarding;Sore Pain Intervention(s): Limited activity within patient's tolerance;Monitored during session;Premedicated before session  Home Living                                          Prior Functioning/Environment               Frequency  Min 2X/week        Progress Toward Goals  OT Goals(current goals can now be found in the care plan section)  Progress towards OT goals: Goals drowngraded-see care plan  Acute Rehab OT Goals Patient Stated Goal: regain strength upon return to home to maintain independence  OT Goal Formulation: With patient Time For Goal Achievement: 05/20/18 Potential to Achieve Goals: [place] Frequency remains appropriate;Discharge plan needs to be updated    Co-evaluation                 AM-PAC OT "6 Clicks" Daily Activity     Outcome Measure   Help from another person eating meals?: A Little Help from another person taking care of personal grooming?: A Little Help from another person toileting, which includes using toliet, bedpan, or urinal?: A Lot Help from another person bathing (including washing, rinsing, drying)?: A  Lot Help from another person to put on and taking off regular upper body clothing?: A Little Help from another person to put on and taking off regular lower body clothing?: A Lot 6 Click Score: 15    End of Session    OT Visit Diagnosis: Other abnormalities of gait and mobility (R26.89);History of falling (Z91.81);Pain Pain - Right/Left: Right Pain - part of body: Leg   Activity Tolerance Patient limited by fatigue;Patient limited by pain   Patient Left     Nurse Communication          Time: 4591-3685 OT Time Calculation (min): 33 min  Charges: OT General Charges $OT Visit: 1 Visit OT Treatments $Self Care/Home Management : 23-37 mins  Shara Blazing, M.S., OTR/L Ascom: [phone] 05/11/18, 1:02 PM

## 2018-05-11 NOTE — Progress Notes (Signed)
Physical Therapy Treatment/Reassessment Patient Details Name: Justin Andrews MRN: [id number] DOB: 1934/06/24 Today's Date: 05/11/2018    History of Present Illness Justin Andrews is an 84 yo male who comes to [facility] 4/25, found on the floor by neighbors minimally responsive in his stool/blood. Upon arrival, hypothermic, hypotensive on arrival and tachypneic, Hb: 3s. Per son, pt was advised to go to hospital by homehealth RN, but pt did not. Later afte rmore dark stools family called EMS, but pt refused to go to ED. PMH: C-diff, CKD, HTN, ETOH abuse, cigarrette use, Lt hip fracture Nov 2019 s/p ORIF and STR stay, recent fall with Right hip pain. Since admission pt is s/p multiple units PRBC and vascular procedure to address GI bleed.  Pt went back to OR 5/1 for ex-lab to address continued GIB.     PT Comments    Pt received in bed, recently finished talking with attending physician. Pt is awake in bed, sitting in room. He immediately remembers author from PT evaluation last week in the ICU. Pt appears more drowsy than at that time, speech slightly slurred, appears anxious (which he confirms when asked). Author take measures to utilize calming speech and body language during visit. Most recent H&H 7.7, 24.1 hence special attention is pain to presyncopal prodrome as patient was orthostatic with only dizziness last week in ICU. Pt is conversational throughout, albeit sometimes tangential. He offers that he has been here for 10 days, that there has been a sitting in the room all morning (but he is confused as to who this person is and why they are in the room.) Pt is oriented to situation a few instances as needed, also oriented to his tele monitor and catheter in hopes to improve pt awareness of safety and precautions. Pt is dizzy at EOB, but confident sitting tall, independently for nearly 10 minutes- symptoms resolve only mildly. Pt given a chance to sit facing the window, as this brings his joy, similar to ICU  session. Pt agreeable to attempt standing twice, using chair back  For BUE support and maximal effort to rise- 3 attempts to rise the first time 2/2 leg weakness. Transport arrives to take patient off unit for GI procedure, and pt is returned to supine, session ended. Pt appears to have sustained some functional decline since evaluation last week. Author has more concerns regarding pt's ability to perform safe/independent mobility, ADL completion, postoperative care, hence PT DC recommendations updated to STR at [facility] for optimal restoration of functional mobility prior to return to home. Pt lived alone PTA and was able to independently AMB in the home with AD and perform his ADL without assistance.     Follow Up Recommendations  Supervision/Assistance - 24 hour;SNF;Supervision for mobility/OOB(Pt has been unable to AMB over the course of a lengthy hospitalization. )     Equipment Recommendations  None recommended by PT    Recommendations for Other Services       Precautions / Restrictions Precautions Precautions: Fall Precaution Comments: Pt is anxious; has been mildly AMS and combative last several days Restrictions Weight Bearing Restrictions: No    Mobility  Bed Mobility Overal bed mobility: Needs Assistance Bed Mobility: Supine to Sit     Supine to sit: Supervision Sit to supine: Mod assist   General bed mobility comments: mild impulsivity, heavy VC for timing and awareness of tele monitor and catheter. Dizzy at EOB, which mildly improves over 5 minutes  Transfers Overall transfer level: Needs assistance Equipment used: 1  person hand held assist Transfers: Sit to/from Stand Sit to Stand: Min assist;Min guard         General transfer comment: performed twice from EOB, slight elevation, given chairback for BUE support. Maintains standing 2x30sec at supervision level.   Ambulation/Gait                 Stairs             Wheelchair Mobility    Modified  Rankin (Stroke Patients Only)       Balance                                            Cognition Arousal/Alertness: Awake/alert Behavior During Therapy: Anxious Overall Cognitive Status: Impaired/Different from baseline                                 General Comments: slightly more difficulty with situational awareness      Exercises      General Comments        Pertinent Vitals/Pain Faces Pain Scale: Hurts little more Pain Location: I hurt all over Pain Descriptors / Indicators: Aching;Discomfort Pain Intervention(s): Limited activity within patient's tolerance    Home Living                      Prior Function            PT Goals (current goals can now be found in the care plan section) Acute Rehab PT Goals Patient Stated Goal: regain strength upon return to home to maintain independence  PT Goal Formulation: With patient Time For Goal Achievement: 05/25/18 Potential to Achieve Goals: Good Progress towards PT goals: Not progressing toward goals - comment(Pt has had more procedures since eval, and struggling with continued ABLA, anxiety, and agitation. )    Frequency    Min 2X/week      PT Plan Discharge plan needs to be updated    Co-evaluation              AM-PAC PT "6 Clicks" Mobility   Outcome Measure  Help needed turning from your back to your side while in a flat bed without using bedrails?: A Little Help needed moving from lying on your back to sitting on the side of a flat bed without using bedrails?: A Little Help needed moving to and from a bed to a chair (including a wheelchair)?: A Little Help needed standing up from a chair using your arms (e.g., wheelchair or bedside chair)?: A Little Help needed to walk in hospital room?: A Lot Help needed climbing 3-5 steps with a railing? : A Lot 6 Click Score: 16    End of Session   Activity Tolerance: Patient tolerated treatment well;Treatment  limited secondary to medical complications (Comment);Other (comment)(dizziness sitting EOB; transport arrives to take pt to UGI test, session ended.) Patient left: in bed;with nursing/sitter in room;with call bell/phone within reach Nurse Communication: Other (comment) PT Visit Diagnosis: Muscle weakness (generalized) (M62.81);Other abnormalities of gait and mobility (R26.89);Difficulty in walking, not elsewhere classified (R26.2);Dizziness and giddiness (R42)     Time: 1610-9604 PT Time Calculation (min) (ACUTE ONLY): 21 min  Charges:  9:36 AM, 05/11/18 Etta Grandchild, PT, DPT Physical Therapist - [facility]  [phone] ([place])     Coleman C 05/11/2018, 9:26 AM

## 2018-05-11 NOTE — Progress Notes (Signed)
   05/11/18 1500  Clinical Encounter Type  Visited With Patient;Health care provider  Visit Type Initial  Ch received a referral from another ch. Pt was reported to seem a little down maybe even depressed. Pt was awake with a sitter at bedside. Ch walked in and the pt said he felt terrible Upon ch's question about whether it was due to physical pain, pt said no. Ch asked if there was one thing he could change to feel better, what it would be. Pt answered "money." Pt thinks with money he could get better treatment. Ch tried to carry on the conversation but pt was tired and wanted to go to bed. Ch informed the pt of available chaplain service.

## 2018-05-11 NOTE — TOC Progression Note (Addendum)
Transition of Care [facility]) - Progression Note    Patient Details  Name: Justin Andrews MRN: [id number] Date of Birth: 1934-10-22  Transition of Care [facility]) CM/SW [place], [place] Phone Number: 05/11/2018, 12:11 PM  Clinical Narrative:  Representative from [facility] came to see patient today for evaluation. [facility] is unable to accept patient at this time. Patient still needs placement and does not have legal guardian listed on chart. CSW will continue to follow for discharge planning.    UPDATE: CSW spoke with Peggye Form with [facility]. Margreta Journey is following patient after an APS report. Per [facility] Journey, patient's son would like him to have a guardian but patient has capacity per their evaluation to make decisions. At this time patient has no guardian and no power of attorney. Per DSS, patient is above the income level for special assistance and would have to sign over his disability check for group home which is aware of. She also states that patient would be eligible for long term medicaid based on income. CSW notified [facility]ta Journey that [facility] has declined patient. Margreta Journey states that she has another group home that could potentially accept patient that she will follow up with. CSW also explained PT change in recommendation of SNF. Both CSW and DSS agree that patient will decline SNF and would be more appropriate for group home at this time. CSW will continue to follow for discharge planning.     Expected Discharge Plan: Home/Self Care Barriers to Discharge: Continued Medical Work up  Expected Discharge Plan and Services Expected Discharge Plan: Home/Self Care   Discharge Planning Services: CM Consult   Living arrangements for the past 2 months: Single Family Home                 DME Arranged: (declines)         HH Arranged: (refuses)           Social Determinants of Health (SDOH) Interventions    Readmission Risk  Interventions Readmission Risk Prevention Plan 05/07/2018  Transportation Screening Complete  PCP or Specialist Appt within 3-5 Days Complete  HRI or Home Care Consult Complete  Medication Review (RN Care Manager) Complete  Some recent data might be hidden

## 2018-05-11 NOTE — Progress Notes (Signed)
Nutrition Follow-up  RD working remotely.  DOCUMENTATION CODES:   Not applicable  INTERVENTION:  Plan is to begin weaning TPN today to 1/2 rate.  Diet was advanced to clear liquids and patient was started on Boost Breeze po TID (each supplement provides 250 kcal and 9 grams of protein).  Will monitor adequacy of PO intake with diet advancement. If intake is not adequate, consider supplemental J-tube feedings to help patient meet calorie/protein needs.  NUTRITION DIAGNOSIS:   Predicted suboptimal nutrient intake related to (hx EtOH abuse, concern for self-neglect) as evidenced by (per chart, slow weight loss from UBW of 150 lbs since 2016).  Ongoing.  GOAL:   Patient will meet greater than or equal to 90% of their needs  Progressing with diet advancement.  MONITOR:   Labs, Weight trends, I & O's, Skin, Other (Comment)(TPN)  REASON FOR ASSESSMENT:   Consult New TPN/TNA  ASSESSMENT:   84 year old male with PMHx of HTN, OA, tobacco abuse, EtOH abuse admitted with GI bleed secondary to oozing duodenal ulcer s/p bipolar cautery on 4/26, C Diff colitis, hemorrhagic shock.   -Patient taken to OR on 5/2. Underwent right IJ central line placement, exploratory laparotomy, EGD, duodenal stricture biopsy, 3 vessel ligation of duodenal bleeding ulcer, duodenoplasty, and placement of feeding jejunostomy.  This morning patient underwent upper GI series. No evidence of duodenal leak was found. Plan is to advance diet to clears today and begin Boost Breeze supplements. J-tube not currently being used at this time. Plan is to wean TPN to 1/2 rate and monitor PO intake.   IV Access: right IJ CVC placed 5/2; tip in mid SVC per chest x-ray 5/2  TPN: Clinimix E 5/15 at 80 mL/hr + 20% ILE at 20 mL/hr x 12 hrs + adult MVI 10 mL + trace elements 1 mL + thiamine 100 mg daily  Enteral Access: J-tube placed on 5/2 in OR  Medications reviewed and include: pantoprazole, Invanz.  Labs reviewed:  CBG 142-171. BUN 41, Creatinine 1.65, Triglycerides 95.  I/O: 1875 mL UOP yesterday (1.2 mL/kg/hr) + 1 occurrence unmeasured UOP; 100 mL output from right abdominal JP drain yesterday; 2 BMs yesterday  Weight trend: 67.6 kg on 5/4; +11.5 kg from 4/25  Diet Order:   Diet Order            Diet clear liquid Room service appropriate? Yes; Fluid consistency: Thin  Diet effective now             EDUCATION NEEDS:   Not appropriate for education at this time  Skin:  Skin Assessment: Skin Integrity Issues:(MSAD to groin; ecchymosis; closed incision to abdomen)  Last BM:  05/11/2018 - medium type 7  Height:   Ht Readings from Last 1 Encounters:  05/03/18 5\' 5"  (1.651 m)   Weight:   Wt Readings from Last 1 Encounters:  05/11/18 67.6 kg   Ideal Body Weight:  61.8 kg  BMI:  Body mass index is 24.8 kg/m.  Estimated Nutritional Needs:   Kcal:  1700-1900kcal/day   Protein:  85-95g/day   Fluid:  1.5-1.7 L/day  Willey Blade, MS, RD, LDN Office: [phone] Pager: [phone] After Hours/Weekend Pager: [phone]

## 2018-05-11 NOTE — Consult Note (Signed)
[facility] NOTE   Pharmacy Consult for TPN Indication: GI bleed, long term NPO  Patient Measurements: Height: 5\' 5"  (165.1 cm) Weight: 149 lb 0.5 oz (67.6 kg) IBW/kg (Calculated) : 61.5 TPN AdjBW (KG): 56.1 Body mass index is 24.8 kg/m. Usual Weight: 150lbs  Assessment:  Pt with inadequate intake since admit. Diet is now being advanced and will begin to wean off TPN   GI:  Current diet - clear liquids, pantoprazole, LBM 5/3 Endo:  Insulin requirements in the past 24 hours: None Lytes: pharmacy on consult - stable x 2 days Renal: CrCl 30 ml/min Cards: Carvedilol  Hepatobil: Neuro: ID:   TPN Access: Central line TPN start date: 5/1  Nutritional Goals (per RD recommendation on 5/1): KCal: 1700-1900 kcal/day Protein: 85-95g/day Fluid: 1.5-1.7L/day  Plan:  Will continue Clinimix 5/15 with electrolytes decreasing to 76ml/hr  Will continue 20% lipids at 24ml/hr x 12 hours per day.  Will add MVI and trace elements daily.   Will add IV thiamine 100mg  daily.    Paulina Fusi, PharmD, BCPS 05/11/2018 11:32 AM

## 2018-05-12 LAB — BASIC METABOLIC PANEL
Anion gap: 7 (ref 5–15)
BUN: 36 mg/dL — ABNORMAL HIGH (ref 8–23)
CO2: 20 mmol/L — ABNORMAL LOW (ref 22–32)
Calcium: 7.5 mg/dL — ABNORMAL LOW (ref 8.9–10.3)
Chloride: 113 mmol/L — ABNORMAL HIGH (ref 98–111)
Creatinine, Ser: 1.58 mg/dL — ABNORMAL HIGH (ref 0.61–1.24)
GFR calc Af Amer: 47 mL/min — ABNORMAL LOW (ref >60)
GFR calc non Af Amer: 40 mL/min — ABNORMAL LOW (ref >60)
Glucose, Bld: 135 mg/dL — ABNORMAL HIGH (ref 70–99)
Potassium: 4.2 mmol/L (ref 3.5–5.1)
Sodium: 140 mmol/L (ref 135–145)

## 2018-05-12 LAB — CBC
HCT: 23.4 % — ABNORMAL LOW (ref 39.0–52.0)
Hemoglobin: 7.4 g/dL — ABNORMAL LOW (ref 13.0–17.0)
MCH: 29.8 pg (ref 26.0–34.0)
MCHC: 31.6 g/dL (ref 30.0–36.0)
MCV: 94.4 fL (ref 80.0–100.0)
Platelets: 288 10*3/uL (ref 150–400)
RBC: 2.48 MIL/uL — ABNORMAL LOW (ref 4.22–5.81)
RDW: 15.7 % — ABNORMAL HIGH (ref 11.5–15.5)
WBC: 18.7 10*3/uL — ABNORMAL HIGH (ref 4.0–10.5)
nRBC: 0 % (ref 0.0–0.2)

## 2018-05-12 LAB — GLUCOSE, CAPILLARY
Glucose-Capillary: 109 mg/dL — ABNORMAL HIGH (ref 70–99)
Glucose-Capillary: 124 mg/dL — ABNORMAL HIGH (ref 70–99)
Glucose-Capillary: 147 mg/dL — ABNORMAL HIGH (ref 70–99)
Glucose-Capillary: 126 mg/dL — ABNORMAL HIGH (ref 70–99)
Glucose-Capillary: 136 mg/dL — ABNORMAL HIGH (ref 70–99)

## 2018-05-12 LAB — SURGICAL PATHOLOGY

## 2018-05-12 MED ORDER — THIAMINE HCL 100 MG/ML IJ SOLN
INTRAVENOUS | Status: AC
  Administered 2018-05-12: 19:00:00 via INTRAVENOUS
  Filled 2018-05-12: qty 960

## 2018-05-12 MED ORDER — FAT EMULSION PLANT BASED 20 % IV EMUL
250.0000 mL | INTRAVENOUS | Status: AC
  Administered 2018-05-12: 19:00:00 250 mL via INTRAVENOUS
  Filled 2018-05-12: qty 250

## 2018-05-12 MED ORDER — FREE WATER
20.0000 mL | Status: DC
  Administered 2018-05-12 – 2018-05-16 (×24): 20 mL

## 2018-05-12 MED ORDER — PIVOT 1.5 CAL PO LIQD
1000.0000 mL | ORAL | Status: DC
  Administered 2018-05-12: 1000 mL
  Filled 2018-05-12: qty 1000

## 2018-05-12 NOTE — Progress Notes (Signed)
Nutrition Follow-up  RD working remotely.  DOCUMENTATION CODES:   Not applicable  INTERVENTION:  Continue TPN at 1/2 rate: Clinimix E 5/15 at 40 mL/hr + 20% ILE at 20 mL/hr x 12 hrs + adult MVI 10 mL + trace elements 1 mL + thiamine 100 mg daily. This provides 1162 kcal, 48 grams of protein, 960 mL fluid daily from Clinimix + 240 mL daily from 20% lipids.  Initiate Pivot 1.5 Cal at 20 mL/hr per J-tube. Provides 720 kcal, 45 grams of protein, 360 mL H2O daily.  Provide free water flush of 20 mL Q4hrs per J-tube to maintain tube patency.  Patient will receive a total of 1882 kcal (100% estimated needs) and 93 grams of protein (100% estimated needs) from nutrition support with this regimen. If patient's PO intake does not improve, can consider weaning TPN off and increasing J-tube feeds if he tolerates tube feeds well.  Continue Boost Breeze po TID, each supplement provides 250 kcal and 9 grams of protein.  NUTRITION DIAGNOSIS:   Predicted suboptimal nutrient intake related to (hx EtOH abuse, concern for self-neglect) as evidenced by (per chart, slow weight loss from UBW of 150 lbs since 2016).  Ongoing.  GOAL:   Patient will meet greater than or equal to 90% of their needs  Not met - progressing with addition of trickle tube feeds today.  MONITOR:   Labs, Weight trends, I & O's, Skin, Other (Comment)(TPN)  REASON FOR ASSESSMENT:   Consult New TPN/TNA  ASSESSMENT:   84 year old male with PMHx of HTN, OA, tobacco abuse, EtOH abuse admitted with GI bleed secondary to oozing duodenal ulcer s/p bipolar cautery on 4/26, C Diff colitis, hemorrhagic shock.  -Patient taken to OR on 5/2. Underwent right IJ central line placement, exploratory laparotomy, EGD, duodenal stricture biopsy, 3 vessel ligation of duodenal bleeding ulcer, duodenoplasty, and placement of feeding jejunostomy. -On 5/4 patient underwent upper GI series. No evidence of duodenal leak was found.  -On 5/4 diet was  advanced to clear liquids and patient was started on Boost Breeze TID. TPN was reduced to 1/2 rate.  Spoke with RN this AM. Patient with poor appetite. He is only taking sips of his clear liquids. Only taking sips of Boost Breeze when brought in by RN. Patient became agitated yesterday with placement of rectal tube, required Haldol, now with another safety sitter. Discussed plan of care with surgeon and attending over secure chat. Plan is to initiate trickle feeds per J-tube today.  IV Access: right IJ CVC placed 5/2; tip in mid SVC per chest x-ray 5/2  TPN: Clinimix E 5/15 at 40 mL/hr + 20% ILE at 20 mL/hr x 12 hrs + adult MVI 10 mL + trace elements 1 mL + thiamine 100 mg daily  Enteral Access: J-tube placed on 5/2 in OR  Medications reviewed and include: pantoprazole, NS with KCl 20 mEq/L at 100 mL/hr, Invanz.  Labs reviewed: CBG 78-149, Chloride 113, CO2 20, BUN 36, Creatinine 1.58.  I/O: 1625 mL UOP yesterday (1 mL/kg/hr); 170 mL output from right abdominal JP drain yesterday; 7x BMs yesterday  Weight trend: 67.6 kg on 5/4; +11.5 kg from 4/25  Diet Order:   Diet Order            Diet clear liquid Room service appropriate? Yes; Fluid consistency: Thin  Diet effective now             EDUCATION NEEDS:   Not appropriate for education at this time    Skin:  Skin Assessment: Skin Integrity Issues:(MSAD to groin; ecchymosis; closed incision to abdomen)  Last BM:  05/11/2018 - medium type 7 per rectal tube placed 5/4  Height:   Ht Readings from Last 1 Encounters:  05/03/18 5' 5" (1.651 m)   Weight:   Wt Readings from Last 1 Encounters:  05/11/18 67.6 kg   Ideal Body Weight:  61.8 kg  BMI:  Body mass index is 24.8 kg/m.  Estimated Nutritional Needs:   Kcal:  1700-1900kcal/day   Protein:  85-95g/day   Fluid:  1.5-1.7 L/day   Stephens, MS, RD, LDN Office: [phone] Pager: [phone] After Hours/Weekend Pager: [phone]  

## 2018-05-12 NOTE — Progress Notes (Signed)
[place] at [place] NAME: Taylor Levick    MR#:  [id number]  DATE OF BIRTH:  [birth year]  SUBJECTIVE:  CHIEF COMPLAINT:   Chief Complaint  Patient presents with  . Fall  . Cold Exposure   The patient is awake, complaint of abdominal pain. He has still has diarrhea and melena.  He has very poor oral intake. REVIEW OF SYSTEMS:  CONSTITUTIONAL: No fever, fatigue or weakness.  EYES: No blurred or double vision.  EARS, NOSE, AND THROAT: No tinnitus or ear pain.  RESPIRATORY: No cough, shortness of breath, wheezing or hemoptysis.  CARDIOVASCULAR: No chest pain, orthopnea, edema.  GASTROINTESTINAL: No nausea, vomiting, but has abdominal pain, diarrhea and melena. GENITOURINARY: No dysuria, hematuria.  ENDOCRINE: No polyuria, nocturia,  HEMATOLOGY: No anemia, easy bruising or bleeding SKIN: No rash or lesion. MUSCULOSKELETAL: No joint pain or arthritis.   NEUROLOGIC: No tingling, numbness, weakness.  PSYCHIATRY: No anxiety or depression.   ROS   DRUG ALLERGIES:   Allergies  Allergen Reactions  . Aspirin Other (See Comments)    bleeding    VITALS:  Blood pressure (!) 176/78, pulse 88, temperature 98.3 F (36.8 C), temperature source Oral, resp. rate 20, height 5\' 5"  (1.651 m), weight 67.6 kg, SpO2 98 %.  PHYSICAL EXAMINATION:   GENERAL:  84 y.o.-year-old patient lying in the bed with  ill looking, in no acute distress. EYES: Pupils equal, round, reactive to light and accommodation. No scleral icterus. Extraocular muscles intact.  Conjunctive are pale. HEENT: Head atraumatic, normocephalic. NECK:  Supple, no jugular venous distention. No thyroid enlargement, no tenderness.  LUNGS: Normal breath sounds bilaterally, mild wheezing, no rales,rhonchi or crepitation. No use of accessory muscles of respiration.  CARDIOVASCULAR: S1, S2 normal. No murmurs, rubs, or gallops.  ABDOMEN: Soft, tenderness, nondistended. Bowel sounds is absent.  Abdominal drainage tube in situ with pink fluid. EXTREMITIES: No pedal edema, cyanosis, or clubbing.  NEUROLOGIC: The patient was sleeping, unable to exam. PSYCHIATRIC: The patient was sleeping. SKIN: No obvious rash, lesion, or ulcer.   Physical Exam LABORATORY PANEL:   CBC Recent Labs  Lab 05/12/18 0708  WBC 18.7*  HGB 7.4*  HCT 23.4*  PLT 288   ------------------------------------------------------------------------------------------------------------------  Chemistries  Recent Labs  Lab 05/11/18 0454 05/12/18 0708  NA 139 140  K 4.0 4.2  CL 111 113*  CO2 22 20*  GLUCOSE 139* 135*  BUN 41* 36*  CREATININE 1.65* 1.58*  CALCIUM 7.4* 7.5*  MG 2.2  --   AST 19  --   ALT 14  --   ALKPHOS 66  --   BILITOT 0.5  --    ------------------------------------------------------------------------------------------------------------------  Cardiac Enzymes Recent Labs  Lab 05/06/18 0723 05/06/18 1321  TROPONINI 0.08* 0.08*   ------------------------------------------------------------------------------------------------------------------  RADIOLOGY:  Dg Ugi W Single Cm (sol Or Thin Ba)  Result Date: 05/11/2018 CLINICAL DATA:  Status post duodenal ulcer surgical repair, reassess for leak EXAM: WATER SOLUBLE UPPER GI SERIES TECHNIQUE: Water-soluble fluoroscopic leak check was performed using water soluble contrast. CONTRAST:  Oral Gastrografin, additional water for volume COMPARISON:  CT abdomen pelvis, 05/02/2018, 12/09/2015 FLUOROSCOPY TIME:  Fluoroscopy Time:  2:24 Number of Acquired Spot Images: 0 FINDINGS: Examination is somewhat limited by patient ability to ingest oral contrast and water. The stomach is therefore underdistended with limited volume of contrast passage into the duodenum. Within this limitation, there is no evidence of duodenal leak. 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  Postoperative findings of midline laparotomy with surgical drains about the mid abdomen. IMPRESSION: Examination is somewhat limited by patient ability to ingest oral contrast and water. The stomach is therefore underdistended with limited volume of contrast passage into the duodenum. Within this limitation, there is no evidence of duodenal leak. 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 Electronically Signed   By: Eddie Candle M.D.   On: 05/11/2018 10:18    ASSESSMENT AND PLAN:   Principal Problem:   GI bleed Active Problems:   Hemorrhagic shock (HCC)   Altered mental status   Anemia due to blood loss, acute  *Hemorrhagic shock Acute blood loss anemia GI bleed Hypothermia  After 4 units PRBC and FFP of transfusion hemoglobin stabilized,  He was on n.p.o. and continue IV fluid. He is on IV PPI twice daily. Blood pressure is stable with IV fluid and blood transfusion. GI had suggested to do nuclear medicine bleeding scan which showed some active bleed in the rectal area. EGD was done which showed some bleed from duodenal area- cauterized.  Seems to patient had some bleed so  vascular surgeon did embolization.   Hemoglobin dropped again so given 1 unit of PRBC and taken to the OR for exploratory laparotomy. He is s/p duodenoplasty and jejunostomy. He was on n.p.o. with TPN.  Follow-up electrolytes. Per Dr. Hampton Abbot, TPN for now, may change to jejunal tube feeding this Monday.  Hemoglobin up to 9.7 after blood transfusion, but down to 7.7. No active bleeding. Per surgeon, started CLD after upper GI test. Changed to po protonix bid. Start J-tube feeding due to poor oral intake.  Taper TPN. Hemoglobin decreased to 7.4.  Follow-up hemoglobin, PRBC transfusion PRN.  Leukocytosis. He still has leukocytosis, continue ertapenem follow-up CBC.  *Acute renal failure on CKD stage III This could be due to acute  blood loss. Continue IV fluid and follow-up BMP.  Electrolytes are normal.  *Hypertension Resumed norvasc.  IV Lopressor PRN.  *History of alcoholism as per son Continue vitamin supplements.  *C. difficile colitis ruled out- diarrhea due to norovirus. Patient had history of C. difficile in recent past. WBC count is high currently.  started on oral Vanco and IV Flagyl. His C. difficile antigen was positive but toxins were negative so stopped the C. difficile antibiotics.  C. difficile is ruled out. His diarrhea resolved.  His GI panel showed norovirus positive. He still has diarrhea.  Agitation.  Haldol as needed, one-to-one sitter for now.  I discussed with Dr. Celine Ahr. All the records are reviewed and case discussed with Care Management/Social Workerr. Management plans discussed with the patient, his son and they are in agreement.  CODE STATUS: DNR  TOTAL TIME TAKING CARE OF THIS PATIENT: 32 minutes.   POSSIBLE D/C IN 3 DAYS, DEPENDING ON CLINICAL CONDITION. Son was worried about him living alone so requested to have placement in some kind of group home facility.  Case manager was looking into that.  But now with more extensive surgeries we will have to wait for repeat physical therapy evaluation.  Demetrios Loll M.D on 05/12/2018   Between 7am to 6pm - Pager - [phone]  After 6pm go to [email or web address] - password [email or web address] [facility]  Office  [phone]  CC: Primary care physician; Leone Haven, MD  Note: This dictation was prepared with Dragon dictation along  with smaller phrase technology. Any transcriptional errors that result from this process are unintentional.

## 2018-05-12 NOTE — Progress Notes (Signed)
OT Cancellation Note  Patient Details Name: Justin Andrews MRN: [id number] DOB: Jan 29, 1934   Cancelled Treatment:    Reason Eval/Treat Not Completed: Patient declined, no reason specified(Will continue to monitor, and intervene at a later time/date.)  Harrel Carina, MS, OTR/L 05/12/2018, 1:58 PM

## 2018-05-12 NOTE — Consult Note (Signed)
[facility] NOTE   Pharmacy Consult for TPN Indication: GI bleed, long term NPO  Patient Measurements: Height: 5\' 5"  (165.1 cm) Weight: 149 lb 0.5 oz (67.6 kg) IBW/kg (Calculated) : 61.5 TPN AdjBW (KG): 56.1 Body mass index is 24.8 kg/m. Usual Weight: 150lbs  Assessment:  Pt with inadequate intake since admit. Diet is now being advanced and will begin to wean off TPN, patient tolerated BOOST Breeze. Patient noted to have poor PO intake and sips of clear liquids.   I-tube trickle feed: Initiate Pivot 1.5 Cal at 20 mL/hr per J-tube. Provides 720 kcal, 45 grams of protein, 360 mL H2O daily.   GI:  Current diet - clear liquids/Boost Breeze, pantoprazole, LBM 5/4 Endo:  Insulin requirements in the past 24 hours: None Lytes: pharmacy on consult - stable x 3 days Renal: CrCl 31.4 ml/min Cards: Carvedilol  Hepatobil: Neuro: ID:   TPN Access: Central line TPN start date: 5/1  Nutritional Goals (per RD recommendation on 5/1): KCal: 1700-1900 kcal/day Protein: 85-95g/day Fluid: 1.5-1.7L/day  Plan:  Will continue Clinimix 5/15 with electrolytes at a rate of 44ml/hr  Will continue 20% lipids at 65ml/hr x 12 hours per day.  Will add MVI 10 mL and trace elements 25mL daily.   Will add IV thiamine 100mg  daily.    Kristeen Miss, PharmD 05/12/2018 12:13 PM

## 2018-05-12 NOTE — Progress Notes (Signed)
Physical Therapy Treatment Patient Details Name: Justin Andrews MRN: [id number] DOB: 1934-06-11 Today's Date: 05/12/2018    History of Present Illness Justin Andrews is an 84 yo male who comes to [facility] 4/25, found on the floor by neighbors minimally responsive in his stool/blood. Upon arrival, hypothermic, hypotensive on arrival and tachypneic, Hb: 3s. Per son, pt was advised to go to hospital by homehealth RN, but pt did not. Later afte rmore dark stools family called EMS, but pt refused to go to ED. PMH: C-diff, CKD, HTN, ETOH abuse, cigarrette use, Lt hip fracture Nov 2019 s/p ORIF and STR stay, recent fall with Right hip pain. Since admission pt is s/p multiple units PRBC and vascular procedure to address GI bleed.  Pt went back to OR 5/1 for ex-lab to address continued GIB.     PT Comments    Pt initially refusing PT and was unequivocal about "Not today, maybe tomorrow."  However he did ultimately agree to get up and do some standing/walking if he could try to use the urinal (which he was unsuccessful after many minutes of static standing).  Pt showed good confidence with minimal in-room ambulation but was uninterested in much interaction or cuing offered from PT and generally was curt and somewhat agitated t/o the session.    Follow Up Recommendations  Supervision/Assistance - 24 hour;SNF;Supervision for mobility/OOB     Equipment Recommendations  None recommended by PT    Recommendations for Other Services       Precautions / Restrictions Precautions Precautions: Fall Restrictions Weight Bearing Restrictions: No    Mobility  Bed Mobility Overal bed mobility: Needs Assistance Bed Mobility: Rolling Rolling: Min guard   Supine to sit: Min assist     General bed mobility comments: Pt attempted to get to sitting EOB on his own, ultimately needed light assist to get to sitting  Transfers Overall transfer level: Needs assistance Equipment used: None Transfers: Sit to/from Stand Sit  to Stand: Min guard;Min assist         General transfer comment: Pt was able to rise to standing with only minimal CGA assist to keep weight forward.  Able to maintain standing at EOB for >5 minutes with light single UE assist (PT assisted with unsuccessful attempt to use urinal in standing)    Ambulation/Gait Ambulation/Gait assistance: Min guard Gait Distance (Feet): 15 Feet Assistive device: None       General Gait Details: Pt was able to ambulate in room w/o UEs and though he was slow and guarded he did not have any LOBs or overt safety issues apart from some impulsivity.  Provided some cuing, however pt curtly cutting PT of an doing his own thing t/o the short bout of ambulation.    Stairs             Wheelchair Mobility    Modified Rankin (Stroke Patients Only)       Balance Overall balance assessment: Needs assistance Sitting-balance support: No upper extremity supported;Feet supported Sitting balance-Leahy Scale: Good     Standing balance support: During functional activity Standing balance-Leahy Scale: Fair Standing balance comment: single UE use to maintain static standing at EOB                            Cognition Arousal/Alertness: Awake/alert Behavior During Therapy: Agitated;Restless  General Comments: Pt uninterested in conversation or really doing much of anything.  He was not forthwith about answering questions apart from telling the PT to stop talking      Exercises      General Comments        Pertinent Vitals/Pain Pain Assessment: 0-10 Pain Location: not rated, but indicates pain in abdomen and rectum (flexi-seal)    Home Living                      Prior Function            PT Goals (current goals can now be found in the care plan section) Progress towards PT goals: Progressing toward goals    Frequency    Min 2X/week      PT Plan Current plan  remains appropriate    Co-evaluation              AM-PAC PT "6 Clicks" Mobility   Outcome Measure  Help needed turning from your back to your side while in a flat bed without using bedrails?: A Little Help needed moving from lying on your back to sitting on the side of a flat bed without using bedrails?: A Little Help needed moving to and from a bed to a chair (including a wheelchair)?: A Little Help needed standing up from a chair using your arms (e.g., wheelchair or bedside chair)?: A Little Help needed to walk in hospital room?: A Little Help needed climbing 3-5 steps with a railing? : A Lot 6 Click Score: 17    End of Session   Activity Tolerance: Treatment limited secondary to agitation Patient left: with chair alarm set;with call bell/phone within reach Nurse Communication: Mobility status PT Visit Diagnosis: Muscle weakness (generalized) (M62.81);Other abnormalities of gait and mobility (R26.89);Difficulty in walking, not elsewhere classified (R26.2);Dizziness and giddiness (R42)     Time: 4163-8453 PT Time Calculation (min) (ACUTE ONLY): 20 min  Charges:  $Therapeutic Activity: 8-22 mins                     Kreg Shropshire, DPT 05/12/2018, 4:40 PM

## 2018-05-12 NOTE — Progress Notes (Signed)
[place] Hospital Day(s): 10.   Post op day(s): 4 Days Post-Op.   Interval History: Underwent upper GI series yesterday.  There was poor distention of the stomach and duodenum, however within those limitations, there was no extravasation of contrast.  He has been initiated on a clear liquid diet, but is not taking very much.  He continues to have dark stools, as well as a slow drift downward in his hematocrit.  He denies significant abdominal pain this morning.  JP drain was disconnected on exam this morning, with some fluid saturating the patient's gown.  Recorded output was 170 cc.   Review of Systems:  Constitutional: denies fever, chills  Respiratory: - Cardiovascular: denies chest pain or palpitations  Gastrointestinal: denies abdominal pain, N/V, having loose stools Integumentary: + Surgical wounds  Vital signs in last 24 hours: [min-max] current  Temp:  [97 F (36.1 C)-98.6 F (37 C)] 98.6 F (37 C) (05/05 0503) Pulse Rate:  [58-165] 86 (05/05 0503) Resp:  [20-24] 20 (05/04 1744) BP: (122-154)/(62-117) 137/62 (05/05 0503) SpO2:  [91 %-99 %] 98 % (05/05 0503)     Height: 5\' 5"  (165.1 cm) Weight: 67.6 kg BMI (Calculated): 24.8   Intake/Output this shift:  Total I/O In: -  Out: 175 [Urine:175]   Physical Exam:  Constitutional: alert, cooperative and no distress  Respiratory: Normal work of breathing on room air Cardiovascular: regular rate and sinus rhythm  Gastrointestinal: soft, non-tender, and non-distended. JP in RLQ with serosanguinous fluid in bulb. J-tube in left abdomen.  Integumentary: Laparotomy incision is CDI, no erythema or drainage.  Honeycomb dressing removed this morning.  Staple line is intact.  No purulent drainage around J-tube site.   Labs:  CBC Latest Ref Rng & Units 05/12/2018 05/11/2018 05/10/2018  WBC 4.0 - 10.5 K/uL 18.7(H) 19.2(H) 20.7(H)  Hemoglobin 13.0 - 17.0 g/dL 7.4(L) 7.7(L) 8.2(L)  Hematocrit 39.0 -  52.0 % 23.4(L) 24.1(L) 25.9(L)  Platelets 150 - 400 K/uL 288 272 285   CMP Latest Ref Rng & Units 05/12/2018 05/11/2018 05/10/2018  Glucose 70 - 99 mg/dL 135(H) 139(H) 188(H)  BUN 8 - 23 mg/dL 36(H) 41(H) 35(H)  Creatinine 0.61 - 1.24 mg/dL 1.58(H) 1.65(H) 1.89(H)  Sodium 135 - 145 mmol/L 140 139 140  Potassium 3.5 - 5.1 mmol/L 4.2 4.0 3.9  Chloride 98 - 111 mmol/L 113(H) 111 111  CO2 22 - 32 mmol/L 20(L) 22 22  Calcium 8.9 - 10.3 mg/dL 7.5(L) 7.4(L) 7.3(L)  Total Protein 6.5 - 8.1 g/dL - 4.8(L) -  Total Bilirubin 0.3 - 1.2 mg/dL - 0.5 -  Alkaline Phos 38 - 126 U/L - 66 -  AST 15 - 41 U/L - 19 -  ALT 0 - 44 U/L - 14 -     Imaging studies:   UGI (05/11/2018) personally reviewed with Dr Rosana Hoes and Radiologist report reviewed:  IMPRESSION: Examination is somewhat limited by patient ability to ingest oral contrast and water. The stomach is therefore underdistended with limited volume of contrast passage into the duodenum. Within this limitation, there is no evidence of duodenal leak. 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   Assessment/Plan: (ICD-10's: K26.9) 84 y.o. male with slightly improving leukocytosis and down trending hemoglobin which may be attributable to surgery + IVF dilution without ay signs of active re-bleeding otherwise doing well 4 Days Post-Op s/p exploratory Laparotomy, EDG, duodenal stricture biopsy, 3 vessel  ligation of duodenal bleeding ulcer, duodenoplasty, feeding jejunostomy for bleeding duodenal ulcer   - Continue clear liquid diet today.  Continue TPN as patient's oral intake is poor.  Consider initiating trickle feeds via the J-tube.  - pain control prn; antiemetics prn  - IV Abx (Ertapenem)  - IV PPI  - Monitor abdominal pain; on going bowel function  - Monitor leukocytosis  - Monitor hgb; transfuse as needed   - Mobilization encouraged   - medical management otherwise per  primary team   All of the above findings and recommendations were discussed with the patient, and the medical team, and all of patient's questions were answered to his expressed satisfaction.

## 2018-05-13 ENCOUNTER — Inpatient Hospital Stay: Payer: Medicare HMO

## 2018-05-13 LAB — MAGNESIUM: Magnesium: 1.9 mg/dL (ref 1.7–2.4)

## 2018-05-13 LAB — BASIC METABOLIC PANEL
Anion gap: 6 (ref 5–15)
BUN: 29 mg/dL — ABNORMAL HIGH (ref 8–23)
CO2: 20 mmol/L — ABNORMAL LOW (ref 22–32)
Calcium: 7.5 mg/dL — ABNORMAL LOW (ref 8.9–10.3)
Chloride: 115 mmol/L — ABNORMAL HIGH (ref 98–111)
Creatinine, Ser: 1.44 mg/dL — ABNORMAL HIGH (ref 0.61–1.24)
GFR calc Af Amer: 52 mL/min — ABNORMAL LOW (ref >60)
GFR calc non Af Amer: 45 mL/min — ABNORMAL LOW (ref >60)
Glucose, Bld: 163 mg/dL — ABNORMAL HIGH (ref 70–99)
Potassium: 4.4 mmol/L (ref 3.5–5.1)
Sodium: 141 mmol/L (ref 135–145)

## 2018-05-13 LAB — GLUCOSE, CAPILLARY
Glucose-Capillary: 143 mg/dL — ABNORMAL HIGH (ref 70–99)
Glucose-Capillary: 133 mg/dL — ABNORMAL HIGH (ref 70–99)
Glucose-Capillary: 144 mg/dL — ABNORMAL HIGH (ref 70–99)
Glucose-Capillary: 134 mg/dL — ABNORMAL HIGH (ref 70–99)

## 2018-05-13 LAB — CBC
HCT: 25 % — ABNORMAL LOW (ref 39.0–52.0)
Hemoglobin: 7.7 g/dL — ABNORMAL LOW (ref 13.0–17.0)
MCH: 29.4 pg (ref 26.0–34.0)
MCHC: 30.8 g/dL (ref 30.0–36.0)
MCV: 95.4 fL (ref 80.0–100.0)
Platelets: 309 10*3/uL (ref 150–400)
RBC: 2.62 MIL/uL — ABNORMAL LOW (ref 4.22–5.81)
RDW: 15.5 % (ref 11.5–15.5)
WBC: 17.3 10*3/uL — ABNORMAL HIGH (ref 4.0–10.5)
nRBC: 0 % (ref 0.0–0.2)

## 2018-05-13 LAB — PHOSPHORUS: Phosphorus: 2.8 mg/dL (ref 2.5–4.6)

## 2018-05-13 LAB — STREP PNEUMONIAE URINARY ANTIGEN: Strep Pneumo Urinary Antigen: NEGATIVE

## 2018-05-13 MED ORDER — PIVOT 1.5 CAL PO LIQD
1000.0000 mL | ORAL | Status: DC
  Administered 2018-05-13 – 2018-05-15 (×3): 1000 mL
  Filled 2018-05-13: qty 1000

## 2018-05-13 MED ORDER — SODIUM CHLORIDE 0.9 % IV SOLN
2.0000 g | Freq: Two times a day (BID) | INTRAVENOUS | Status: DC
  Administered 2018-05-13 – 2018-05-16 (×6): 2 g via INTRAVENOUS
  Filled 2018-05-13 (×7): qty 2

## 2018-05-13 MED ORDER — SODIUM CHLORIDE 0.9 % IV SOLN
1.0000 g | Freq: Three times a day (TID) | INTRAVENOUS | Status: DC
  Administered 2018-05-13: 1 g via INTRAVENOUS
  Filled 2018-05-13 (×2): qty 1

## 2018-05-13 MED ORDER — IPRATROPIUM-ALBUTEROL 0.5-2.5 (3) MG/3ML IN SOLN
3.0000 mL | Freq: Four times a day (QID) | RESPIRATORY_TRACT | Status: DC
  Administered 2018-05-13 – 2018-05-14 (×5): 3 mL via RESPIRATORY_TRACT
  Filled 2018-05-13 (×6): qty 3

## 2018-05-13 MED ORDER — FUROSEMIDE 10 MG/ML IJ SOLN
20.0000 mg | Freq: Once | INTRAMUSCULAR | Status: AC
  Administered 2018-05-13: 13:00:00 20 mg via INTRAVENOUS
  Filled 2018-05-13: qty 2

## 2018-05-13 NOTE — Evaluation (Signed)
Clinical/Bedside Swallow Evaluation Patient Details  Name: Justin Andrews MRN: [id number] Date of Birth: 1934/03/13  Today's Date: 05/13/2018 Time: SLP Start Time (ACUTE ONLY): 1500 SLP Stop Time (ACUTE ONLY): 1536 SLP Time Calculation (min) (ACUTE ONLY): 36 min  Past Medical History:  Past Medical History:  Diagnosis Date  . C. difficile diarrhea 11/2017  . Elevated serum creatinine   . History of melanoma   . Hypertension   . Osteoarthritis   . Tobacco abuse    Past Surgical History:  Past Surgical History:  Procedure Laterality Date  . ANKLE FRACTURE SURGERY    . CENTRAL VENOUS CATHETER INSERTION  05/08/2018   Procedure: INSERTION CENTRAL LINE ADULT;  Surgeon: Olean Ree, MD;  Location: [facility] ORS;  Service: General;;  . ESOPHAGOGASTRODUODENOSCOPY  05/08/2018   Procedure: ESOPHAGOGASTRODUODENOSCOPY (EGD);  Surgeon: Olean Ree, MD;  Location: [facility] ORS;  Service: General;;  . ESOPHAGOGASTRODUODENOSCOPY (EGD) WITH PROPOFOL N/A 05/03/2018   Procedure: ESOPHAGOGASTRODUODENOSCOPY (EGD) WITH PROPOFOL;  Surgeon: Toledo, Benay Pike, MD;  Location: [facility];  Service: Gastroenterology;  Laterality: N/A;  . EXTERNAL EAR SURGERY    . INTRAMEDULLARY (IM) NAIL INTERTROCHANTERIC Left 12/01/2017   Procedure: INTRAMEDULLARY (IM) NAIL INTERTROCHANTRIC;  Surgeon: Thornton Park, MD;  Location: [facility] ORS;  Service: Orthopedics;  Laterality: Left;  . JEJUNOSTOMY  05/08/2018   Procedure: JEJUNOSTOMY;  Surgeon: Olean Ree, MD;  Location: [facility] ORS;  Service: General;;  . LAPAROTOMY N/A 05/08/2018   Procedure: EXPLORATORY LAPAROTOMY;  Surgeon: Olean Ree, MD;  Location: [facility] ORS;  Service: General;  Laterality: N/A;  . ROTATOR CUFF REPAIR    . TONSILLECTOMY    . TYMPANOPLASTY W/ MASTOIDECTOMY     Patient with reports of mastoid surgery (appears to have had surgery on TM; Left).  . VISCERAL ARTERY INTERVENTION N/A 05/04/2018   Procedure: VISCERAL ARTERY INTERVENTION;  Surgeon: Algernon Huxley, MD;   Location: [facility];  Service: Cardiovascular;  Laterality: N/A;   HPI:  84 year old man with hospital stay for GI bleed.  The patient was admitted 05/02/2018.  Surgery 05/08/2018 (Right IJ central line placement, exploratory Laparotomy, EDG, duodenal stricture biopsy, 3 vessel ligation of duodenal bleeding ulcer, duodenoplasty, feeding jejunostomy).  Diet advanced to clear liquid 05/11/2018.  MD note 05/13/2018: "Pneumonia, possible HAP or aspiration pneumonia. The patient has cough, wheezing and shortness of breath.  Chest x-ray today show bilateral multifocal pneumonia.  The patient has been on Invanz after surgery. Start cefepime IV, follow-up CBC, blood culture and sputum culture.  Continue Robitussin and nebulizer PRN.  Speech study."   Assessment / Plan / Recommendation Clinical Impression  The patient does not demonstrate clinical indicators of aspiration with thin liquid.  He is able to independently drink from the cup and swallow with no vocal quality change, no cough/throat clearing, or other indicators of aspiration.  The patient does not appear to be at risk for aspiration of his current, clear liquid, diet.     SLP Visit Diagnosis: Dysphagia, unspecified (R13.10)    Aspiration Risk  No limitations;Risk for inadequate nutrition/hydration    Diet Recommendation Thin liquid   Liquid Administration via: Cup Medication Administration: Whole meds with liquid Supervision: Patient able to self feed(Liquids only assessed)    Other  Recommendations Oral Care Recommendations: Oral care BID;Staff/trained caregiver to provide oral care   Follow up Recommendations        Frequency and Duration            Prognosis  Swallow Study   General Date of Onset: 05/13/18 HPI: 84 year old man with hospital stay for GI bleed.  The patient was admitted 05/02/2018.  Surgery 05/08/2018 (Right IJ central line placement, exploratory Laparotomy, EDG, duodenal stricture biopsy, 3 vessel  ligation of duodenal bleeding ulcer, duodenoplasty, feeding jejunostomy).  Diet advanced to clear liquid 05/11/2018.  MD note 05/13/2018: "Pneumonia, possible HAP or aspiration pneumonia. The patient has cough, wheezing and shortness of breath.  Chest x-ray today show bilateral multifocal pneumonia.  The patient has been on Invanz after surgery. Start cefepime IV, follow-up CBC, blood culture and sputum culture.  Continue Robitussin and nebulizer PRN.  Speech study." Type of Study: Bedside Swallow Evaluation Previous Swallow Assessment: None Diet Prior to this Study: Other (Comment);Thin liquids(Clear liquid) Behavior/Cognition: Alert;Confused;Impulsive;Distractible Self-Feeding Abilities: Able to feed self(Liquids assessed only)    Oral/Motor/Sensory Function     Ice Chips     Thin Liquid Thin Liquid: Within functional limits Presentation: Cup;Self Fed    Nectar Thick     Honey Thick     Puree     Solid           Leroy Sea, MS/CCC- SLP  Lou Miner 05/13/2018,3:37 PM

## 2018-05-13 NOTE — Progress Notes (Signed)
[place] Hospital Day(s): 11.   Post op day(s): 5 Days Post-Op.   Interval History: Patient seen and examined, no acute events or new complaints overnight. Patient demonstrated some hypoxia this morning, though this improved following administration of Lasix. Trickle tube feeds were also initiated yesterday due to patient's limited oral nutrition intake despite having clear liquids diet ordered with TPN rate halved following no leak or obstruction on UGI imaging study 2 days ago. Patient otherwise reports mild peri-incisional pain and denies N/V, fever/chills, CP, or SOB, though he requires frequent reorientation with questions.  Review of Systems:  Constitutional: denies fever, chills  Respiratory: denies any shortness of breath  Cardiovascular: denies chest pain or palpitations  Gastrointestinal: abdominal pain, N/V, and bowel function as per interval history Musculoskeletal: denies pain, decreased motor or sensation Integumentary: denies any other rashes or skin discolorations except post-surgical wounds  Vital signs in last 24 hours: [min-max] current  Temp:  [97.6 F (36.4 C)-98.4 F (36.9 C)] 98.4 F (36.9 C) (05/06 0341) Pulse Rate:  [86-116] 97 (05/06 0544) Resp:  [20] 20 (05/06 0341) BP: (133-176)/(65-78) 133/65 (05/06 0544) SpO2:  [92 %-97 %] 92 % (05/06 0341)     Height: 5\' 5"  (165.1 cm) Weight: 67.6 kg BMI (Calculated): 24.8   Intake/Output this shift:  No intake/output data recorded.   Intake/Output last 2 shifts:  @IOLAST2SHIFTS @   Physical Exam:  Constitutional: alert, cooperative and no distress  Respiratory: breathing non-labored at rest  Cardiovascular: regular rate and sinus rhythm  Gastrointestinal: soft and non-distended with mild peri-incisional tenderness to palpation, bulb suction drain well-secured with small amount of serosanguinous fluid in bulb and tubing without erythema surrounding drain or post-surgical incision sites  Labs:   CBC Latest Ref Rng & Units 05/13/2018 05/12/2018 05/11/2018  WBC 4.0 - 10.5 K/uL 17.3(H) 18.7(H) 19.2(H)  Hemoglobin 13.0 - 17.0 g/dL 7.7(L) 7.4(L) 7.7(L)  Hematocrit 39.0 - 52.0 % 25.0(L) 23.4(L) 24.1(L)  Platelets 150 - 400 K/uL 309 288 272   CMP Latest Ref Rng & Units 05/13/2018 05/12/2018 05/11/2018  Glucose 70 - 99 mg/dL 163(H) 135(H) 139(H)  BUN 8 - 23 mg/dL 29(H) 36(H) 41(H)  Creatinine 0.61 - 1.24 mg/dL 1.44(H) 1.58(H) 1.65(H)  Sodium 135 - 145 mmol/L 141 140 139  Potassium 3.5 - 5.1 mmol/L 4.4 4.2 4.0  Chloride 98 - 111 mmol/L 115(H) 113(H) 111  CO2 22 - 32 mmol/L 20(L) 20(L) 22  Calcium 8.9 - 10.3 mg/dL 7.5(L) 7.5(L) 7.4(L)  Total Protein 6.5 - 8.1 g/dL - - 4.8(L)  Total Bilirubin 0.3 - 1.2 mg/dL - - 0.5  Alkaline Phos 38 - 126 U/L - - 66  AST 15 - 41 U/L - - 19  ALT 0 - 44 U/L - - 14   Imaging studies: No new pertinent imaging studies   Assessment/Plan: (ICD-10's: K26.9) 84 y.o. male with hypoxia this morning attributed to hypervolemia and slowly, but consistently, improving leukocytosis and and Cr (AKI), along with stable hemoglobin (acute blood loss anemia + hemodilution) without any signs of active re-bleeding, otherwise doing okay though with limited PO nutrition, 5 Days Post-Op s/p exploratory Laparotomy, EDG, duodenal stricture biopsy, 3 vessel ligation of duodenal bleeding ulcer, duodenoplasty, feeding jejunostomy for bleeding duodenal ulcer.              - pain control as needed (minimize narcotics as tolerated)  - PPI, antibiotics (ertapenem), and medical management as per primary medical team             -  discussed with nutrition to advance tube feeds to goal while discontinuing TPN for improved nutrition and reduced fluid volume  - okay with continuing clear liquids diet and Boost Breeze clear liquids nutritional supplement with advancement to full liquids diet if patient's mental status permits doing so (concern expressed by patient's RN accordingly)             -  monitor ongoing abdominal exam and bowel function             - trend WBC, Cr, and Hb with transfusion as needed             - DVT prophylaxis, ambulation + PT encouraged  All of the above findings and recommendations were discussed with the patient, and the medical team, and all of patient's questions were answered to his expressed satisfaction.  Thank you for the opportunity to participate in this patient's care.  -- Marilynne Drivers Rosana Hoes, MD, [place]: [facility] - Partnering for exceptional care. Office: [phone]

## 2018-05-13 NOTE — Progress Notes (Signed)
PHARMACY NOTE:  ANTIMICROBIAL RENAL DOSAGE ADJUSTMENT  Current antimicrobial regimen includes a mismatch between antimicrobial dosage and estimated renal function.  As per policy approved by the Pharmacy & Therapeutics and Medical Executive Committees, the antimicrobial dosage will be adjusted accordingly.  Current antimicrobial dosage:  Cefepime 1 g Q8H   Indication: HAP   Renal Function:   Estimated Creatinine Clearance: 34.4 mL/min (A) (by C-G formula based on SCr of 1.44 mg/dL (H)).     Antimicrobial dosage has been changed to:  Cefepime 2g Q12H  Additional comments: N/A   Thank you for allowing pharmacy to be a part of this patient's care.  Rowland Lathe, PharmD 05/13/2018 1:21 PM

## 2018-05-13 NOTE — Progress Notes (Signed)
PT Cancellation Note  Patient Details Name: Justin Andrews MRN: [id number] DOB: 09/01/1934   Cancelled Treatment:    Reason Eval/Treat Not Completed: Patient declined, no reason specified Patient refused to work with PT. Will try back this afternoon.   Shelton Silvas PT, DPT Shelton Silvas 05/13/2018, 10:57 AM

## 2018-05-13 NOTE — Consult Note (Signed)
[place] NOTE   Pharmacy Consult for TPN Indication: GI bleed, long term NPO  Patient Measurements: Height: 5\' 5"  (165.1 cm) Weight: 149 lb 0.5 oz (67.6 kg) IBW/kg (Calculated) : 61.5 TPN AdjBW (KG): 56.1 Body mass index is 24.8 kg/m. Usual Weight: 150lbs  Assessment:  Plan is to discontinue TPN/Fat Emulsion when current bag runs out tonight. Tube feeds will be advanced to goal.   I-tube trickle feed: Pivot 1.5 Cal at 20 mL/hr per J-tube + free water flush 20 mL Q4hrs  GI:  Current diet - clear liquids/Boost Breeze, pantoprazole, LBM 5/6 Endo:  Insulin requirements in the past 24 hours: None Lytes: pharmacy on consult - stable x 4 days Renal: CrCl 31.4 ml/min Cards: Carvedilol  Hepatobil: Neuro: ID:   TPN Access: Central line TPN start date: 5/1  Nutritional Goals (per RD recommendation on 5/1): KCal: 1700-1900 kcal/day Protein: 85-95g/day Fluid: 1.5-1.7L/day  Plan:  Will continue to follow electrolytes with AM labs.   Kristeen Miss, PharmD 05/13/2018 1:27 PM

## 2018-05-13 NOTE — Progress Notes (Addendum)
[facility] [place] NAME: Justin Andrews    MR#:  [id number]  DATE OF BIRTH:  [birth year]  SUBJECTIVE:  CHIEF COMPLAINT:   Chief Complaint  Patient presents with  . Fall  . Cold Exposure   The patient has cough and wheezing, he still has diarrhea and melena.  He has very poor oral intake, on J-tube feeding REVIEW OF SYSTEMS:  CONSTITUTIONAL: No fever, fatigue or weakness.  EYES: No blurred or double vision.  EARS, NOSE, AND THROAT: No tinnitus or ear pain.  RESPIRATORY: has cough, shortness of breath, wheezing but no hemoptysis.  CARDIOVASCULAR: No chest pain, orthopnea, edema.  GASTROINTESTINAL: No nausea, vomiting, but has abdominal pain, diarrhea and melena. GENITOURINARY: No dysuria, hematuria.  ENDOCRINE: No polyuria, nocturia,  HEMATOLOGY: No anemia, easy bruising or bleeding SKIN: No rash or lesion. MUSCULOSKELETAL: No joint pain or arthritis.   NEUROLOGIC: No tingling, numbness, weakness.  PSYCHIATRY: No anxiety or depression.   ROS   DRUG ALLERGIES:   Allergies  Allergen Reactions  . Aspirin Other (See Comments)    bleeding    VITALS:  Blood pressure (!) 153/78, pulse 98, temperature 97.9 F (36.6 C), temperature source Oral, resp. rate 19, height 5\' 5"  (1.651 m), weight 67.6 kg, SpO2 92 %.  PHYSICAL EXAMINATION:   GENERAL:  84 y.o.-year-old patient lying in the bed with  ill looking, in no acute distress. EYES: Pupils equal, round, reactive to light and accommodation. No scleral icterus. Extraocular muscles intact.  Conjunctive are pale. HEENT: Head atraumatic, normocephalic. NECK:  Supple, no jugular venous distention. No thyroid enlargement, no tenderness.  LUNGS: Normal breath sounds bilaterally, bilateral wheezing and crackles. No use of accessory muscles of respiration.  CARDIOVASCULAR: S1, S2 normal. No murmurs, rubs, or gallops.  ABDOMEN: Soft, tenderness, nondistended. Bowel sounds is absent. Abdominal drainage  tube in situ with pink fluid. Black diarrhea in Flexi-Seal bag. EXTREMITIES: No pedal edema, cyanosis, or clubbing.  NEUROLOGIC: The patient was sleeping, unable to exam. PSYCHIATRIC: The patient was sleeping. SKIN: No obvious rash, lesion, or ulcer.   Physical Exam LABORATORY PANEL:   CBC Recent Labs  Lab 05/13/18 0507  WBC 17.3*  HGB 7.7*  HCT 25.0*  PLT 309   ------------------------------------------------------------------------------------------------------------------  Chemistries  Recent Labs  Lab 05/11/18 0454  05/13/18 0507  NA 139   < > 141  K 4.0   < > 4.4  CL 111   < > 115*  CO2 22   < > 20*  GLUCOSE 139*   < > 163*  BUN 41*   < > 29*  CREATININE 1.65*   < > 1.44*  CALCIUM 7.4*   < > 7.5*  MG 2.2  --  1.9  AST 19  --   --   ALT 14  --   --   ALKPHOS 66  --   --   BILITOT 0.5  --   --    < > = values in this interval not displayed.   ------------------------------------------------------------------------------------------------------------------  Cardiac Enzymes Recent Labs  Lab 05/06/18 1321  TROPONINI 0.08*   ------------------------------------------------------------------------------------------------------------------  RADIOLOGY:  Dg Chest Port 1 View  Result Date: 05/13/2018 CLINICAL DATA:  Wheezing for 1 day. EXAM: PORTABLE CHEST 1 VIEW COMPARISON:  Single-view of the chest 05/09/2018 and 05/02/2018. FINDINGS: The patient has extensive bilateral airspace disease which is worse on the left and has markedly progressed since the most recent comparison. No pneumothorax. Small bilateral pleural effusions noted.  Heart size is upper normal. Aortic atherosclerosis is noted. Right IJ catheter is unchanged. NG tube is been removed. Remote right rib fractures are noted. IMPRESSION: Extensive asymmetric bilateral airspace disease is worse on the left and has progressed since the most recent examination. The appearance is most suggestive of multifocal  pneumonia but could represent asymmetric edema with small effusions noted. Electronically Signed   By: Inge Rise M.D.   On: 05/13/2018 10:00    ASSESSMENT AND PLAN:   Principal Problem:   GI bleed Active Problems:   Hemorrhagic shock (HCC)   Altered mental status   Anemia due to blood loss, acute  *Hemorrhagic shock Acute blood loss anemia GI bleed Hypothermia  After 4 units PRBC and FFP of transfusion hemoglobin stabilized,  He was on n.p.o. and continue IV fluid. He is on IV PPI twice daily. Blood pressure is stable with IV fluid and blood transfusion. GI had suggested to do nuclear medicine bleeding scan which showed some active bleed in the rectal area. EGD was done which showed some bleed from duodenal area- cauterized.  Seems to patient had some bleed so  vascular surgeon did embolization.   Hemoglobin dropped again so given 1 unit of PRBC and taken to the OR for exploratory laparotomy. He is s/p duodenoplasty and jejunostomy. He was on n.p.o. with TPN.  Follow-up electrolytes. Per Dr. Hampton Abbot, TPN for now, may change to jejunal tube feeding this Monday.  Hemoglobin up to 9.7 after blood transfusion, but down to 7.7. No active bleeding. Per surgeon, started CLD after upper GI test. Changed to po protonix bid. Start J-tube feeding due to poor oral intake.  Taper TPN. Hemoglobin is 7.7.  Follow-up hemoglobin, PRBC transfusion PRN.  Leukocytosis. He still has leukocytosis, continue ertapenem follow-up CBC.  *Acute renal failure on CKD stage III This could be due to acute blood loss. Improving with IV fluid and follow-up BMP.  Electrolytes are normal.  *Hypertension Resumed norvasc.  IV Lopressor PRN.  *History of alcoholism as per son Continue vitamin supplements.  *C. difficile colitis ruled out- diarrhea due to norovirus. Patient had history of C. difficile in recent past. WBC count is high currently.  started on oral Vanco and IV Flagyl. His C.  difficile antigen was positive but toxins were negative so stopped the C. difficile antibiotics.  C. difficile is ruled out. His diarrhea resolved.  His GI panel showed norovirus positive. He still has diarrhea.  Agitation.  Haldol as needed, one-to-one sitter for now.   Pneumonia, possible HAP or aspiration pneumonia. The patient has cough, wheezing and shortness of breath.  Chest x-ray today show bilateral multifocal pneumonia.  The patient has been on Invanz after surgery. Start cefepime IV, follow-up CBC, blood culture and sputum culture.  Continue Robitussin and nebulizer PRN.  Speech study.  Acute respiratory failure with hypoxia, due to pneumonia. The patient's O2 saturation decreased to 88% per RN.  He is put on oxygen by nasal cannula 3 L just now.  Continue DuoNeb and Robitussin.  Treat pneumonia as above.  All the records are reviewed and case discussed with Care Management/Social Workerr. Management plans discussed with the patient, his son and they are in agreement.  CODE STATUS: DNR  TOTAL TIME TAKING CARE OF THIS PATIENT: 36 minutes.  POSSIBLE D/C IN >3 DAYS, DEPENDING ON CLINICAL CONDITION.  Demetrios Loll M.D on 05/13/2018   Between 7am to 6pm - Pager - [phone]  After 6pm go to [email or web address] - [place]  [phone]  CC: Primary care physician; Leone Haven, MD  Note: This dictation was prepared with Dragon dictation along with smaller phrase technology. Any transcriptional errors that result from this process are unintentional.

## 2018-05-13 NOTE — Progress Notes (Addendum)
Nutrition Follow-up  RD working remotely.  DOCUMENTATION CODES:   Not applicable  INTERVENTION:  Plan is to discontinue TPN when current bag runs out tonight.  Advance Pivot 1.5 Cal to goal rate of 45 mL/hr per J-tube. Provides 1620 kcal, 101 grams of protein, 810 mL H2O daily.  Continue free water flush of 20 mL Q4hrs per J-tube to maintain tube patency.  Continue Boost Breeze po TID, each supplement provides 250 kcal and 9 grams of protein.  NUTRITION DIAGNOSIS:   Predicted suboptimal nutrient intake related to (hx EtOH abuse, concern for self-neglect) as evidenced by (per chart, slow weight loss from UBW of 150 lbs since 2016).  Ongoing - addressing with nutrition support.  GOAL:   Patient will meet greater than or equal to 90% of their needs  Met with nutrition support.  MONITOR:   Labs, Weight trends, I & O's, Skin, Other (Comment)(TPN)  REASON FOR ASSESSMENT:   Consult New TPN/TNA  ASSESSMENT:   84 year old male with PMHx of HTN, OA, tobacco abuse, EtOH abuse admitted with GI bleed secondary to oozing duodenal ulcer s/p bipolar cautery on 4/26, C Diff colitis, hemorrhagic shock.   -Patient taken to OR on 5/2. Underwent right IJ central line placement, exploratory laparotomy, EGD, duodenal stricture biopsy, 3 vessel ligation of duodenal bleeding ulcer, duodenoplasty, and placement of feeding jejunostomy. -On 5/4 patient underwent upper GI series. No evidence of duodenal leak was found.  -On 5/4 diet was advanced to clear liquids and patient was started on Boost Breeze TID. TPN was reduced to 1/2 rate. -On 5/5 TPN was kept at 1/2 rate and tube feeds were started at trickle rate per J-tube.  Spoke with RNs caring for patient. Unsure how patient did with his breakfast tray this morning. He is refusing to try clear liquids at lunch today. He is short of breath and was placed on 3L [place] today. Patient is tolerating tube feeds. Discussed nutrition plan of care with surgeon.  Plan is to discontinue TPN and advance tube feeds to goal rate.  IV Access: right IJ CVC placed 5/2; tip in mid SVC per chest x-ray 5/2  TPN: Clinimix E 5/15 at 40 mL/hr + 20% ILE at 20 mL/hr x 12 hrs + adult MVI 10 mL + trace elements 1 mL + thiamine 100 mg daily  Enteral Access: J-tube placed on 5/2 in OR  TF: Pivot 1.5 Cal at 20 mL/hr + free water flush 20 mL Q4hrs  Medications reviewed and include: Boost Breeze po TID, pantoprazole, NS @ 100 ml/hr, cefepime.  Labs reviewed: CBG 126-143, Chloride 115, CO2 20, BUN 29, Creatinine 1.44.  I/O: 1025 ml UOP yesterday (0.6 mL/kg/hr) + 7 occurrences unmeasured UOP; 145 mL output from right abdominal JP drain yesterday   Weight trend: 67.6 kg on 5/4; +11.5 kg from 4/25  Diet Order:   Diet Order            Diet clear liquid Room service appropriate? Yes; Fluid consistency: Thin  Diet effective now             EDUCATION NEEDS:   Not appropriate for education at this time  Skin:  Skin Assessment: Skin Integrity Issues:(MSAD to groin; ecchymosis; closed incision to abdomen)  Last BM:  05/11/2018 - medium type 7 per rectal tube placed 5/4  Height:   Ht Readings from Last 1 Encounters:  05/03/18 '5\' 5"'  (1.651 m)   Weight:   Wt Readings from Last 1 Encounters:  05/11/18 67.6  kg   Ideal Body Weight:  61.8 kg  BMI:  Body mass index is 24.8 kg/m.  Estimated Nutritional Needs:   Kcal:  1700-1900kcal/day   Protein:  85-95g/day   Fluid:  1.5-1.7 L/day  Justin Blade, MS, RD, LDN Office: [phone] Pager: [phone] After Hours/Weekend Pager: [phone]

## 2018-05-14 LAB — GLUCOSE, CAPILLARY
Glucose-Capillary: 132 mg/dL — ABNORMAL HIGH (ref 70–99)
Glucose-Capillary: 149 mg/dL — ABNORMAL HIGH (ref 70–99)
Glucose-Capillary: 120 mg/dL — ABNORMAL HIGH (ref 70–99)
Glucose-Capillary: 126 mg/dL — ABNORMAL HIGH (ref 70–99)

## 2018-05-14 LAB — COMPREHENSIVE METABOLIC PANEL
ALT: 13 U/L (ref 0–44)
AST: 16 U/L (ref 15–41)
Albumin: 1.9 g/dL — ABNORMAL LOW (ref 3.5–5.0)
Alkaline Phosphatase: 86 U/L (ref 38–126)
Anion gap: 6 (ref 5–15)
BUN: 30 mg/dL — ABNORMAL HIGH (ref 8–23)
CO2: 22 mmol/L (ref 22–32)
Calcium: 7.7 mg/dL — ABNORMAL LOW (ref 8.9–10.3)
Chloride: 112 mmol/L — ABNORMAL HIGH (ref 98–111)
Creatinine, Ser: 1.45 mg/dL — ABNORMAL HIGH (ref 0.61–1.24)
GFR calc Af Amer: 52 mL/min — ABNORMAL LOW (ref >60)
GFR calc non Af Amer: 45 mL/min — ABNORMAL LOW (ref >60)
Glucose, Bld: 143 mg/dL — ABNORMAL HIGH (ref 70–99)
Potassium: 4.2 mmol/L (ref 3.5–5.1)
Sodium: 140 mmol/L (ref 135–145)
Total Bilirubin: 0.4 mg/dL (ref 0.3–1.2)
Total Protein: 5.5 g/dL — ABNORMAL LOW (ref 6.5–8.1)

## 2018-05-14 LAB — CBC
HCT: 24.1 % — ABNORMAL LOW (ref 39.0–52.0)
Hemoglobin: 7.6 g/dL — ABNORMAL LOW (ref 13.0–17.0)
MCH: 29.5 pg (ref 26.0–34.0)
MCHC: 31.5 g/dL (ref 30.0–36.0)
MCV: 93.4 fL (ref 80.0–100.0)
Platelets: 350 10*3/uL (ref 150–400)
RBC: 2.58 MIL/uL — ABNORMAL LOW (ref 4.22–5.81)
RDW: 15.4 % (ref 11.5–15.5)
WBC: 14.3 10*3/uL — ABNORMAL HIGH (ref 4.0–10.5)
nRBC: 0 % (ref 0.0–0.2)

## 2018-05-14 LAB — MAGNESIUM: Magnesium: 1.9 mg/dL (ref 1.7–2.4)

## 2018-05-14 LAB — PHOSPHORUS: Phosphorus: 2.7 mg/dL (ref 2.5–4.6)

## 2018-05-14 MED ORDER — ALUM & MAG HYDROXIDE-SIMETH 200-200-20 MG/5ML PO SUSP
30.0000 mL | Freq: Four times a day (QID) | ORAL | Status: DC | PRN
  Administered 2018-05-14 – 2018-05-24 (×2): 30 mL via ORAL
  Filled 2018-05-14 (×2): qty 30

## 2018-05-14 NOTE — Progress Notes (Signed)
CC: s/p lap bleeding duodenal ulcer  Subjective: No new events Taking po TF ok Rectal tube w some melena Hb 7.6 stable  Now on O2 [place]  Objective: Vital signs in last 24 hours: Temp:  [97.8 F (36.6 C)-98.3 F (36.8 C)] 98.3 F (36.8 C) (05/07 0509) Pulse Rate:  [89-104] 93 (05/07 1320) Resp:  [19-20] 19 (05/06 2006) BP: (121-135)/(54-68) 135/64 (05/07 1320) SpO2:  [91 %-98 %] 95 % (05/07 1407) FiO2 (%):  [95 %] 95 % (05/06 1931) Weight:  [66.4 kg] 66.4 kg (05/07 0509) Last BM Date: 05/14/18(rectal tube)  Intake/Output from previous day: 05/06 0701 - 05/07 0700 In: 4702.5 [I.V.:3441.5; NG/GT:120; IV Piggyback:250] Out: 2020 [Urine:1550; Drains:170; Stool:300] Intake/Output this shift: Total I/O In: 308 [NG/GT:308] Out: 170 [Urine:100; Drains:70]  Physical exam:  Debilitated male, alert Abd: soft, minimal appropriate incisional tenderness, no peritonitis, no infection. Drain in place serous fluid. Jejunostomy tube ok. No peritonitis Ext: well perfused and warm.  Lab Results: CBC  Recent Labs    05/13/18 0507 05/14/18 0952  WBC 17.3* 14.3*  HGB 7.7* 7.6*  HCT 25.0* 24.1*  PLT 309 350   BMET Recent Labs    05/13/18 0507 05/14/18 0511  NA 141 140  K 4.4 4.2  CL 115* 112*  CO2 20* 22  GLUCOSE 163* 143*  BUN 29* 30*  CREATININE 1.44* 1.45*  CALCIUM 7.5* 7.7*   PT/INR No results for input(s): LABPROT, INR in the last 72 hours. ABG No results for input(s): PHART, HCO3 in the last 72 hours.  Invalid input(s): PCO2, PO2  Studies/Results: Dg Chest Port 1 View  Result Date: 05/13/2018 CLINICAL DATA:  Wheezing for 1 day. EXAM: PORTABLE CHEST 1 VIEW COMPARISON:  Single-view of the chest 05/09/2018 and 05/02/2018. FINDINGS: The patient has extensive bilateral airspace disease which is worse on the left and has markedly progressed since the most recent comparison. No pneumothorax. Small bilateral pleural effusions noted. Heart size is upper normal. Aortic  atherosclerosis is noted. Right IJ catheter is unchanged. NG tube is been removed. Remote right rib fractures are noted. IMPRESSION: Extensive asymmetric bilateral airspace disease is worse on the left and has progressed since the most recent examination. The appearance is most suggestive of multifocal pneumonia but could represent asymmetric edema with small effusions noted. Electronically Signed   By: Inge Rise M.D.   On: 05/13/2018 10:00    Anti-infectives: Anti-infectives (From admission, onward)   Start     Dose/Rate Route Frequency Ordered Stop   05/13/18 2300  ceFEPIme (MAXIPIME) 2 g in sodium chloride 0.9 % 100 mL IVPB     2 g 200 mL/hr over 30 Minutes Intravenous Every 12 hours 05/13/18 1326     05/13/18 1100  ceFEPIme (MAXIPIME) 1 g in sodium chloride 0.9 % 100 mL IVPB  Status:  Discontinued     1 g 200 mL/hr over 30 Minutes Intravenous Every 8 hours 05/13/18 1010 05/13/18 1326   05/10/18 1000  ertapenem (INVANZ) 500 mg in sodium chloride 0.9 % 50 mL IVPB  Status:  Discontinued     500 mg 100 mL/hr over 30 Minutes Intravenous  Every morning - 10a 05/09/18 1355 05/13/18 1010   05/09/18 0900  ertapenem (INVANZ) 1,000 mg in sodium chloride 0.9 % 100 mL IVPB  Status:  Discontinued     1 g 200 mL/hr over 30 Minutes Intravenous Every 24 hours 05/09/18 0801 05/09/18 1355   05/09/18 0800  ertapenem (INVANZ) injection 1,000 mg  Status:  Discontinued  1 g Intramuscular Every 24 hours 05/09/18 0751 05/09/18 0801   05/08/18 1200  ertapenem (INVANZ) 1,000 mg in sodium chloride 0.9 % 100 mL IVPB    Note to Pharmacy:  On hold to OR   1 g 200 mL/hr over 30 Minutes Intravenous  Once 05/08/18 0935 05/08/18 1900   05/04/18 1800  ceFAZolin (ANCEF) IVPB 1 g/50 mL premix  Status:  Discontinued     1 g 100 mL/hr over 30 Minutes Intravenous  Once 05/04/18 1749 05/04/18 1750   05/04/18 1800  ceFAZolin (ANCEF) IVPB 2g/100 mL premix     2 g 200 mL/hr over 30 Minutes Intravenous  Once 05/04/18  1749 05/04/18 1834   05/04/18 1300  fidaxomicin (DIFICID) tablet 200 mg  Status:  Discontinued     200 mg Oral 2 times daily 05/04/18 1156 05/04/18 1423   05/02/18 2130  metroNIDAZOLE (FLAGYL) IVPB 500 mg  Status:  Discontinued     500 mg 100 mL/hr over 60 Minutes Intravenous Every 8 hours 05/02/18 2129 05/04/18 1423   05/02/18 1415  vancomycin (VANCOCIN) 50 mg/mL oral solution 500 mg  Status:  Discontinued     500 mg Oral Every 6 hours 05/02/18 1413 05/04/18 1156   05/02/18 1030  piperacillin-tazobactam (ZOSYN) IVPB 3.375 g  Status:  Discontinued     3.375 g 100 mL/hr over 30 Minutes Intravenous Every 8 hours 05/02/18 0934 05/02/18 0938   05/02/18 1030  piperacillin-tazobactam (ZOSYN) IVPB 3.375 g  Status:  Discontinued     3.375 g 12.5 mL/hr over 240 Minutes Intravenous Every 8 hours 05/02/18 0938 05/02/18 2129   05/02/18 0730  metroNIDAZOLE (FLAGYL) IVPB 500 mg     500 mg 100 mL/hr over 60 Minutes Intravenous  Once 05/02/18 0726 05/02/18 1041      Assessment/Plan:  S/p open  3 vessel ligation bleeding duodenal ulcer Continue tf, may change to a different formula to decrease diarrhea Mobilize No need for reintervention Follow hb May advance diet although given delirium and resp issues doubt he will take much   Caroleen Hamman, MD, FACS  05/14/2018

## 2018-05-14 NOTE — Care Management Important Message (Signed)
Important Message  Patient Details  Name: Justin Andrews MRN: [id number] Date of Birth: 07/21/1934   Medicare Important Message Given:  Yes    Su Hilt, RN 05/14/2018, 11:38 AM

## 2018-05-14 NOTE — Progress Notes (Signed)
[place] at [place] NAME: Justin Andrews    MR#:  [id number]  DATE OF BIRTH:  [birth year]  SUBJECTIVE:  CHIEF COMPLAINT:   Chief Complaint  Patient presents with  . Fall  . Cold Exposure  No new complaint this morning.  No fevers.  Patient resting comfortably.  Diarrhea improving but patient still has rectal tube in place.  No fevers.  REVIEW OF SYSTEMS:  Review of Systems  Constitutional: Negative for chills and fever.  HENT: Negative for ear discharge and tinnitus.   Eyes: Negative for blurred vision and double vision.  Respiratory: Negative for shortness of breath.   Cardiovascular: Negative for chest pain and palpitations.  Gastrointestinal: Positive for diarrhea. Negative for heartburn.  Genitourinary: Negative for dysuria and urgency.  Musculoskeletal: Negative for myalgias and neck pain.  Skin: Negative for itching and rash.  Neurological: Negative for dizziness and headaches.  Psychiatric/Behavioral: Negative for depression and hallucinations.    DRUG ALLERGIES:   Allergies  Allergen Reactions  . Aspirin Other (See Comments)    bleeding   VITALS:  Blood pressure (!) 133/56, pulse (!) 101, temperature 98.3 F (36.8 C), temperature source Oral, resp. rate 19, height 5\' 5"  (1.651 m), weight 66.4 kg, SpO2 94 %. PHYSICAL EXAMINATION:    Physical Exam  Constitutional: He is oriented to person, place, and time. He appears well-developed and well-nourished.  HENT:  Head: Normocephalic and atraumatic.  Eyes: Pupils are equal, round, and reactive to light. Conjunctivae and EOM are normal. No scleral icterus.  Neck: Normal range of motion. Neck supple. No tracheal deviation present.  Cardiovascular: Normal rate, regular rhythm and normal heart sounds.  Respiratory: Effort normal. He has rales.  GI: Soft. Bowel sounds are normal.  PEG tube replaced.  In place.  Site of recent abdominal surgery well opposed with no signs of  infection.  Musculoskeletal: Normal range of motion.        General: No edema.  Neurological: He is alert and oriented to person, place, and time.  Skin: Skin is warm. He is not diaphoretic. No erythema.  Psychiatric: He has a normal mood and affect. His behavior is normal.   LABORATORY PANEL:  Male CBC Recent Labs  Lab 05/14/18 0952  WBC 14.3*  HGB 7.6*  HCT 24.1*  PLT 350   ------------------------------------------------------------------------------------------------------------------ Chemistries  Recent Labs  Lab 05/14/18 0511  NA 140  K 4.2  CL 112*  CO2 22  GLUCOSE 143*  BUN 30*  CREATININE 1.45*  CALCIUM 7.7*  MG 1.9  AST 16  ALT 13  ALKPHOS 86  BILITOT 0.4   RADIOLOGY:  No results found. ASSESSMENT AND PLAN:   1.Hemorrhagic shock  Secondary to acute blood loss anemia from GI bleed Resolved with blood pressure stable.  Patient status post 4 units PRBC and FFP of transfusion and hemoglobin stabilized with most recent hemoglobin of 7.6 this morning. He is on IV PPI twice daily. Blood pressure is stable with IV fluid and blood transfusion. GI had suggested to do nuclear medicine bleeding scan which showed some active bleed in the rectal area. EGD was done which showed some bleed from duodenal area- cauterized.  Seems to patient had some bleed so  vascular surgeon did embolization.   He is s/p duodenoplasty and jejunostomy. Patient currently on clear liquids.  Also getting tube feeding via  jejunal tube feeding due to decreased p.o. intake.  TPN taper Follow-up hemoglobin, PRBC transfusion PRN.  2.  Acute renal failure on CKD stage III This could be due to acute blood loss.  Renal function improved recently and remains fairly stable. Electrolytes are normal.  3.Hypertension Resumed norvasc.  IV Lopressor PRN.  4. History of alcoholism as per son Continue vitamin supplements.  5. C. difficile colitis ruled out- diarrhea due to norovirus. Patient  had history of C. difficile in recent past. Diarrhea gradually improving.  Patient has a rectal tube in place. P.o. vancomycin and Flagyl previously discontinued  6. Agitation.    Resolved.   Patient currently does not require a sitter    7. Pneumonia, possible HAP or aspiration pneumonia. The patient has cough, wheezing and shortness of breath.  Chest x-ray today show bilateral multifocal pneumonia.  The patient had been on Invanz after surgery. Patient currently on IV cefepime.  Leukocytosis improving. Reevaluated by speech therapist and no evidence of aspiration on evaluation.  Recommended continuing clear liquid diet for now.  8. Acute respiratory failure with hypoxia, due to pneumonia. The patient's O2 saturation decreased to 88% per RN.   Being weaned off oxygen.  Treat pneumonia as above.  DVT prophylaxis; SCDs No heparin products due to anemia requiring multiple units of packed red blood cell transfusions   All the records are reviewed and case discussed with Care Management/Social Worker. Management plans discussed with the patient, family and they are in agreement.  CODE STATUS: DNR  TOTAL TIME TAKING CARE OF THIS PATIENT: 38 minutes.   More than 50% of the time was spent in counseling/coordination of care: YES  POSSIBLE D/C IN 2 DAYS, DEPENDING ON CLINICAL CONDITION.   Anticipate discharge to group home when medically stable.   Justin Andrews M.D on 05/14/2018 at 10:30 AM  Between 7am to 6pm - Pager - [phone]  After 6pm go to [email or web address] - [employer] [facility]  Office  [phone]  CC: Primary care physician; Leone Haven, MD  Note: This dictation was prepared with Dragon dictation along with smaller phrase technology. Any transcriptional errors that result from this process are unintentional.

## 2018-05-14 NOTE — Progress Notes (Signed)
  Speech Language Pathology Treatment: Dysphagia  Patient Details Name: Justin Andrews MRN: [id number] DOB: 02/08/1934 Today's Date: 05/14/2018 Time: 9518-8416 SLP Time Calculation (min) (ACUTE ONLY): 30 min  Assessment / Plan / Recommendation Clinical Impression  Pt seen for ongoing toleration of oral diet - pt is currently on a Clear Liquids diet per GI d/t underlying GI dx and surgery. Pt has a Jtube and is receiving TFs currently. He c/o abdominal discomfort - Nursing informed; repositioned w/ pillows. Noted recent CXR: "Extensive asymmetric bilateral airspace disease is worse on the left".  Pt consumed few sips via Straw of thin liquids w/ no overt s/s of aspiration noted - clear vocal quality post trials and no decline in respiratory effort/status during/post trials. Oral phase appeared [facility] for bolus management and oral clearing post swallow. Pt fed self w/ setup A. He declined further d/t abdominal discomfort.  Recommend continue w/ general aspiration precautions w/ any oral intake/diet to include swallowing Pills w/ a Puree if needed for easier, safer swallowing at this time(hospitalization, illness). Recommend tray setup and positioning in bed for any oral intake. ST services will continue to f/u w/ any education indicated while admitted. GI continues to follow pt for appropriateness of diet consistency upgrade - d/t underlying GI issues, Surgery. NSG updated.     HPI HPI: Pt is an 84 year old man with hospital stay for GI bleed.  The patient was admitted 05/02/2018.  Surgery 05/08/2018 (Right IJ central line placement, exploratory Laparotomy, EDG, duodenal stricture biopsy, 3 vessel ligation of duodenal bleeding ulcer, duodenoplasty, feeding jejunostomy).  Diet advanced to clear liquid 05/11/2018.  MD note 05/13/2018: "Pneumonia, possible HAP or aspiration pneumonia. The patient has cough, wheezing and shortness of breath.  Chest x-ray today show bilateral multifocal pneumonia.  The patient has been on  Invanz after surgery. Start cefepime IV, follow-up CBC, blood culture and sputum culture.  Continue Robitussin and nebulizer PRN.  Speech study."      SLP Plan  Continue with current plan of care       Recommendations  Diet recommendations: Thin liquid(clear liquid diet per GI) Liquids provided via: Cup;Straw Medication Administration: Whole meds with puree(if needed for easier, safer swallowing) Supervision: Patient able to self feed Compensations: Minimize environmental distractions;Slow rate;Small sips/bites Postural Changes and/or Swallow Maneuvers: Seated upright 90 degrees;Upright 30-60 min after meal(reflux precautions)                General recommendations: (Dietician, GI f/u) Oral Care Recommendations: Oral care BID;Staff/trained caregiver to provide oral care Follow up Recommendations: None(GI f/u) SLP Visit Diagnosis: Dysphagia, unspecified (R13.10)(GI issues baseline) Plan: Continue with current plan of care       [place], [place], [place] 05/14/2018, 2:54 PM

## 2018-05-15 LAB — BASIC METABOLIC PANEL
Anion gap: 6 (ref 5–15)
BUN: 31 mg/dL — ABNORMAL HIGH (ref 8–23)
CO2: 22 mmol/L (ref 22–32)
Calcium: 7.8 mg/dL — ABNORMAL LOW (ref 8.9–10.3)
Chloride: 114 mmol/L — ABNORMAL HIGH (ref 98–111)
Creatinine, Ser: 1.56 mg/dL — ABNORMAL HIGH (ref 0.61–1.24)
GFR calc Af Amer: 47 mL/min — ABNORMAL LOW (ref >60)
GFR calc non Af Amer: 41 mL/min — ABNORMAL LOW (ref >60)
Glucose, Bld: 151 mg/dL — ABNORMAL HIGH (ref 70–99)
Potassium: 4.2 mmol/L (ref 3.5–5.1)
Sodium: 142 mmol/L (ref 135–145)

## 2018-05-15 LAB — CBC
HCT: 22.5 % — ABNORMAL LOW (ref 39.0–52.0)
Hemoglobin: 6.9 g/dL — ABNORMAL LOW (ref 13.0–17.0)
MCH: 28.6 pg (ref 26.0–34.0)
MCHC: 30.7 g/dL (ref 30.0–36.0)
MCV: 93.4 fL (ref 80.0–100.0)
Platelets: 355 10*3/uL (ref 150–400)
RBC: 2.41 MIL/uL — ABNORMAL LOW (ref 4.22–5.81)
RDW: 15.3 % (ref 11.5–15.5)
WBC: 11.5 10*3/uL — ABNORMAL HIGH (ref 4.0–10.5)
nRBC: 0 % (ref 0.0–0.2)

## 2018-05-15 LAB — GLUCOSE, CAPILLARY
Glucose-Capillary: 107 mg/dL — ABNORMAL HIGH (ref 70–99)
Glucose-Capillary: 115 mg/dL — ABNORMAL HIGH (ref 70–99)
Glucose-Capillary: 119 mg/dL — ABNORMAL HIGH (ref 70–99)
Glucose-Capillary: 139 mg/dL — ABNORMAL HIGH (ref 70–99)
Glucose-Capillary: 142 mg/dL — ABNORMAL HIGH (ref 70–99)

## 2018-05-15 LAB — HEMOGLOBIN AND HEMATOCRIT, BLOOD
HCT: 26.9 % — ABNORMAL LOW (ref 39.0–52.0)
Hemoglobin: 8.6 g/dL — ABNORMAL LOW (ref 13.0–17.0)

## 2018-05-15 LAB — MAGNESIUM: Magnesium: 2.1 mg/dL (ref 1.7–2.4)

## 2018-05-15 LAB — PREPARE RBC (CROSSMATCH)

## 2018-05-15 MED ORDER — SODIUM CHLORIDE 0.9% IV SOLUTION
Freq: Once | INTRAVENOUS | Status: AC
  Administered 2018-05-15: 11:00:00 via INTRAVENOUS

## 2018-05-15 MED ORDER — IPRATROPIUM-ALBUTEROL 0.5-2.5 (3) MG/3ML IN SOLN
3.0000 mL | Freq: Three times a day (TID) | RESPIRATORY_TRACT | Status: DC
  Administered 2018-05-15 – 2018-05-16 (×4): 3 mL via RESPIRATORY_TRACT
  Filled 2018-05-15 (×3): qty 3

## 2018-05-15 NOTE — Progress Notes (Signed)
OT Cancellation Note  Patient Details Name: Justin Andrews MRN: [id number] DOB: 1934/02/18   Cancelled Treatment:    Reason Eval/Treat Not Completed: Medical issues which prohibited therapy. Pt noted with Hgb of 6.9. Currently receiving blood transfusion. Will continue to follow acutely and re-attempt at later date/time as medically appropriate.   Jeni Salles, MPH, MS, OTR/L ascom [phone] 05/15/18, 11:20 AM

## 2018-05-15 NOTE — Progress Notes (Signed)
[place] at [place] NAME: Justin Andrews    MR#:  [id number]  DATE OF BIRTH:  October 22, 1934  SUBJECTIVE:  CHIEF COMPLAINT:   Chief Complaint  Patient presents with   Fall   Cold Exposure  No new complaint this morning.  No fevers.  Patient resting comfortably.  Diarrhea improved and rectal tube was discontinued yesterday.  Diet advanced from clear liquids to full liquid by surgeon today.   REVIEW OF SYSTEMS:  Review of Systems  Constitutional: Negative for chills and fever.  HENT: Negative for ear discharge and tinnitus.   Eyes: Negative for blurred vision and double vision.  Respiratory: Negative for shortness of breath.   Cardiovascular: Negative for chest pain and palpitations.  Gastrointestinal: Negative for diarrhea and heartburn.  Genitourinary: Negative for dysuria and urgency.  Musculoskeletal: Negative for myalgias and neck pain.  Skin: Negative for itching and rash.  Neurological: Negative for dizziness and headaches.  Psychiatric/Behavioral: Negative for depression and hallucinations.    DRUG ALLERGIES:   Allergies  Allergen Reactions   Aspirin Other (See Comments)    bleeding   VITALS:  Blood pressure 128/61, pulse 97, temperature 98.6 F (37 C), temperature source Oral, resp. rate 16, height 5\' 5"  (1.651 m), weight 66.4 kg, SpO2 99 %. PHYSICAL EXAMINATION:    Physical Exam  Constitutional: He is oriented to person, place, and time. He appears well-developed and well-nourished.  HENT:  Head: Normocephalic and atraumatic.  Eyes: Pupils are equal, round, and reactive to light. Conjunctivae and EOM are normal. No scleral icterus.  Neck: Normal range of motion. Neck supple. No tracheal deviation present.  Cardiovascular: Normal rate, regular rhythm and normal heart sounds.  Respiratory: Effort normal. He has rales.  GI: Soft. Bowel sounds are normal.  PEG tube replaced.  In place.  Site of recent abdominal surgery  well opposed with no signs of infection.  Musculoskeletal: Normal range of motion.        General: No edema.  Neurological: He is alert and oriented to person, place, and time.  Skin: Skin is warm. He is not diaphoretic. No erythema.  Psychiatric: He has a normal mood and affect. His behavior is normal.   LABORATORY PANEL:  Male CBC Recent Labs  Lab 05/15/18 0515  WBC 11.5*  HGB 6.9*  HCT 22.5*  PLT 355   ------------------------------------------------------------------------------------------------------------------ Chemistries  Recent Labs  Lab 05/14/18 0511 05/15/18 0515  NA 140 142  K 4.2 4.2  CL 112* 114*  CO2 22 22  GLUCOSE 143* 151*  BUN 30* 31*  CREATININE 1.45* 1.56*  CALCIUM 7.7* 7.8*  MG 1.9 2.1  AST 16  --   ALT 13  --   ALKPHOS 86  --   BILITOT 0.4  --    RADIOLOGY:  No results found. ASSESSMENT AND PLAN:   1.Hemorrhagic shock  Secondary to acute blood loss anemia from GI bleed Patient status post laparotomy for bleeding duodenal ulcer status post ligation. Resolved with blood pressure stable.  Patient status post 4 units PRBC and FFP of transfusion previously and hemoglobin stabilized.  Noted drop in hemoglobin to 6.9 this morning.  1 unit of packed red blood cell transfusion ordered by surgeon this morning.  He is on IV PPI twice daily. GI had suggested to do nuclear medicine bleeding scan which showed some active bleed in the rectal area. EGD was done which showed some bleed from duodenal area- cauterized.  Seems to patient had  some bleed so  vascular surgeon did embolization.   He is s/p duodenoplasty and jejunostomy. Surgeon advance diet from clear to full liquid diet today.  Patient also tube feeding via  jejunal tube feeding due to decreased p.o. intake.  TPN taper Follow-up hemoglobin, PRBC transfusion PRN.  2. Acute renal failure on CKD stage III This is likely due to acute blood loss.  Monitor renal function  closely  3.Hypertension Resumed norvasc.  IV Lopressor PRN.  4. History of alcoholism as per son Continue vitamin supplements.  5.  Diarrhea ; resolved.  Rectal tube discontinued. C. difficile colitis ruled out- diarrhea due to norovirus. Patient had history of C. difficile in recent past. P.o. vancomycin and Flagyl previously discontinued  6. Agitation.    Resolved.   Patient currently does not require a sitter    7. Pneumonia, possible HAP or aspiration pneumonia. The patient has cough, wheezing and shortness of breath.  Chest x-ray previously show bilateral multifocal pneumonia.  The patient had been on Invanz after surgery. Patient currently on IV cefepime.  Leukocytosis improving. Reevaluated by speech therapist and no evidence of aspiration on evaluation.   8. Acute respiratory failure with hypoxia, due to pneumonia. The patient's O2 saturation decreased to 88% per RN.   Being weaned off oxygen.  Treat pneumonia as above.  DVT prophylaxis; SCDs No heparin products due to anemia requiring multiple units of packed red blood cell transfusions   All the records are reviewed and case discussed with Care Management/Social Worker. Management plans discussed with the patient, family and they are in agreement.  CODE STATUS: DNR  TOTAL TIME TAKING CARE OF THIS PATIENT: 38 minutes.   More than 50% of the time was spent in counseling/coordination of care: YES  POSSIBLE D/C IN 2 DAYS, DEPENDING ON CLINICAL CONDITION.   Anticipate discharge to group home when medically stable.   Sharetta Ricchio M.D on 05/15/2018 at 11:28 AM  Between 7am to 6pm - Pager - [phone]  After 6pm go to [email or web address] - Technical brewer [facility]  Office  [phone]  CC: Primary care physician; Leone Haven, MD  Note: This dictation was prepared with Dragon dictation along with smaller phrase technology. Any transcriptional errors that result from this  process are unintentional.

## 2018-05-15 NOTE — Progress Notes (Signed)
SLP Cancellation Note  Patient Details Name: Justin Andrews MRN: [id number] DOB: Oct 17, 1934   Cancelled treatment:       Reason Eval/Treat Not Completed: SLP screened, no needs identified, will sign off(pt in w/ NSG care ongoing). Reviewed chart notes; labs and recent CXR. Reviewed tx notes and evaluation indicating "patient does not demonstrate clinical indicators of aspiration with thin liquid.  He is able to independently drink from the cup and swallow with no vocal quality change, no cough/throat clearing, or other indicators of aspiration.  The patient does not appear to be at risk for aspiration of his current, clear liquid, diet". Pt tolerated bites/sips at breakfast meal and Pills this morning per NSG w/out difficulty. Pt's diet remains restricted d/t his GI issues ongoing currently; GI will recommend when appropriate to upgrade to solid foods per NSG report. Recommend continue general aspiration precautions and Pills w/ a puree IF easier for swallowing.  NSG to reconsult ST services if needs indicate while admitted. CM updated.     Orinda Kenner, MS, CCC-SLP Brittania Sudbeck 05/15/2018, 11:00 AM

## 2018-05-15 NOTE — Progress Notes (Signed)
05/15/2018  Subjective: Patient is 7 Days Post-Op.  No acute events overnight.  Patient's hemoglobin down today to 6.9 although there is no record of any bloody bowel movement.  Rectal tube was removed yesterday.  Any significant pain or worsening.  Tolerating tube feeds and full liquid diet although not eating much.  Vital signs: Temp:  [98.2 F (36.8 C)-98.6 F (37 C)] 98.6 F (37 C) (05/08 1045) Pulse Rate:  [83-99] 97 (05/08 1045) Resp:  [16-19] 16 (05/08 1045) BP: (107-140)/(52-87) 128/61 (05/08 1045) SpO2:  [75 %-100 %] 99 % (05/08 1045) Weight:  [66.4 kg] 66.4 kg (05/08 0500)   Intake/Output: 05/07 0701 - 05/08 0700 In: 526.6 [I.V.:20.3; NG/GT:308; IV Piggyback:198.4] Out: 1082 [Urine:275; Drains:807] Last BM Date: 05/14/18  Physical Exam: Constitutional: No acute distress Abdomen: Soft, Nondistended, appropriately tender to palpation. Incision clean dry and intact with staples in place.  Right-sided JP drain with serous fluid.  Left-sided feeding tube connected to pump.  Labs:  Recent Labs    05/14/18 0952 05/15/18 0515  WBC 14.3* 11.5*  HGB 7.6* 6.9*  HCT 24.1* 22.5*  PLT 350 355   Recent Labs    05/14/18 0511 05/15/18 0515  NA 140 142  K 4.2 4.2  CL 112* 114*  CO2 22 22  GLUCOSE 143* 151*  BUN 30* 31*  CREATININE 1.45* 1.56*  CALCIUM 7.7* 7.8*   No results for input(s): LABPROT, INR in the last 72 hours.  Imaging: No results found.  Assessment/Plan: This is a 84 y.o. male s/p exploratory laparotomy with duodenal bleeding ulcer repair and stricturoplasty.  - We will transfuse 1 unit of red blood cells today.  Otherwise continue tube feeds at goal and encourage full liquids for p.o. intake.  At this point there is no evidence of gross bleeding.   Melvyn Neth, [facility]

## 2018-05-15 NOTE — Progress Notes (Signed)
PT Cancellation Note  Patient Details Name: Justin Andrews MRN: [id number] DOB: 17-Jun-1934   Cancelled Treatment:    Reason Eval/Treat Not Completed: Patient declined, no reason specified Pt refuses PT, "I'm just not felling it today."    Kreg Shropshire, DPT 05/15/2018, 4:54 PM

## 2018-05-15 NOTE — TOC Progression Note (Signed)
Transition of Care [facility]) - Progression Note    Patient Details  Name: Justin Andrews MRN: [id number] Date of Birth: [birth year]  Transition of Care [facility]) CM/SW [place], [place] Phone Number: 05/15/2018, 10:14 AM  Clinical Narrative:   CSW received phone call from Peggye Form with [facility]. Margreta Journey states that she has another family care home that is interested in patient. CSW has faxed FL2 to [facility] for review. Patient remains on full liquid diet at this time. CSW will continue to follow for discharge planning.     Expected Discharge Plan: Home/Self Care Barriers to Discharge: Continued Medical Work up  Expected Discharge Plan and Services Expected Discharge Plan: Home/Self Care   Discharge Planning Services: CM Consult   Living arrangements for the past 2 months: Single Family Home                 DME Arranged: (declines)         HH Arranged: (refuses)           Social Determinants of Health (SDOH) Interventions    Readmission Risk Interventions Readmission Risk Prevention Plan 05/07/2018  Transportation Screening Complete  PCP or Specialist Appt within 3-5 Days Complete  HRI or Home Care Consult Complete  Medication Review (RN Care Manager) Complete  Some recent data might be hidden

## 2018-05-16 LAB — CBC
HCT: 25.4 % — ABNORMAL LOW (ref 39.0–52.0)
Hemoglobin: 8.1 g/dL — ABNORMAL LOW (ref 13.0–17.0)
MCH: 29.2 pg (ref 26.0–34.0)
MCHC: 31.9 g/dL (ref 30.0–36.0)
MCV: 91.7 fL (ref 80.0–100.0)
Platelets: 371 10*3/uL (ref 150–400)
RBC: 2.77 MIL/uL — ABNORMAL LOW (ref 4.22–5.81)
RDW: 15.5 % (ref 11.5–15.5)
WBC: 11.4 10*3/uL — ABNORMAL HIGH (ref 4.0–10.5)
nRBC: 0 % (ref 0.0–0.2)

## 2018-05-16 LAB — BASIC METABOLIC PANEL
Anion gap: 5 (ref 5–15)
BUN: 32 mg/dL — ABNORMAL HIGH (ref 8–23)
CO2: 23 mmol/L (ref 22–32)
Calcium: 8 mg/dL — ABNORMAL LOW (ref 8.9–10.3)
Chloride: 115 mmol/L — ABNORMAL HIGH (ref 98–111)
Creatinine, Ser: 1.67 mg/dL — ABNORMAL HIGH (ref 0.61–1.24)
GFR calc Af Amer: 44 mL/min — ABNORMAL LOW (ref >60)
GFR calc non Af Amer: 38 mL/min — ABNORMAL LOW (ref >60)
Glucose, Bld: 139 mg/dL — ABNORMAL HIGH (ref 70–99)
Potassium: 4 mmol/L (ref 3.5–5.1)
Sodium: 143 mmol/L (ref 135–145)

## 2018-05-16 LAB — MAGNESIUM: Magnesium: 2 mg/dL (ref 1.7–2.4)

## 2018-05-16 LAB — GLUCOSE, CAPILLARY
Glucose-Capillary: 145 mg/dL — ABNORMAL HIGH (ref 70–99)
Glucose-Capillary: 116 mg/dL — ABNORMAL HIGH (ref 70–99)

## 2018-05-16 MED ORDER — ONDANSETRON 4 MG PO TBDP
4.0000 mg | ORAL_TABLET | Freq: Four times a day (QID) | ORAL | Status: DC | PRN
  Filled 2018-05-16: qty 1

## 2018-05-16 MED ORDER — MORPHINE SULFATE 10 MG/5ML PO SOLN
2.0000 mg | ORAL | Status: DC | PRN
  Administered 2018-05-19 – 2018-05-26 (×10): 2 mg via ORAL
  Filled 2018-05-16 (×12): qty 5

## 2018-05-16 MED ORDER — IPRATROPIUM-ALBUTEROL 0.5-2.5 (3) MG/3ML IN SOLN
3.0000 mL | Freq: Two times a day (BID) | RESPIRATORY_TRACT | Status: DC
  Administered 2018-05-16: 3 mL via RESPIRATORY_TRACT
  Filled 2018-05-16 (×2): qty 3

## 2018-05-16 MED ORDER — AMOXICILLIN-POT CLAVULANATE 500-125 MG PO TABS
1.0000 | ORAL_TABLET | Freq: Two times a day (BID) | ORAL | Status: DC
  Administered 2018-05-16 – 2018-05-18 (×5): 500 mg via ORAL
  Filled 2018-05-16 (×6): qty 1

## 2018-05-16 MED ORDER — CARVEDILOL 3.125 MG PO TABS
6.2500 mg | ORAL_TABLET | Freq: Two times a day (BID) | ORAL | Status: DC
  Administered 2018-05-16 – 2018-05-26 (×17): 6.25 mg via ORAL
  Filled 2018-05-16 (×21): qty 2

## 2018-05-16 MED ORDER — TRAMADOL HCL 50 MG PO TABS
50.0000 mg | ORAL_TABLET | ORAL | Status: DC | PRN
  Administered 2018-05-16 – 2018-05-26 (×36): 50 mg via ORAL
  Filled 2018-05-16 (×38): qty 1

## 2018-05-16 MED ORDER — ACETAMINOPHEN 325 MG PO TABS
650.0000 mg | ORAL_TABLET | Freq: Four times a day (QID) | ORAL | Status: DC | PRN
  Administered 2018-05-18 (×2): 650 mg via ORAL
  Filled 2018-05-16 (×2): qty 2

## 2018-05-16 NOTE — Progress Notes (Signed)
Assumed care from [facility], RN. Took patient vital signs, gave pain medicine (patient states 23 out of 10 pain) and lopressor. Assessed abdomen, JP drain and respiratory system. Called tele-sitter with change of staff. Patient awake. Will continue to monitor. Stacie Glaze, RN

## 2018-05-16 NOTE — Progress Notes (Signed)
OT Cancellation Note  Patient Details Name: SOCORRO EBRON MRN: [id number] DOB: [birth year]   Cancelled Treatment:    Reason Eval/Treat Not Completed: Other (comment). Pt with nursing for pt care needs. Will re-attempt OT tx at later date/time as pt is available and medically appropriate.   Jeni Salles, MPH, MS, OTR/L ascom [phone] 05/16/18, 11:24 AM

## 2018-05-16 NOTE — Progress Notes (Signed)
Subjective:  CC: Justin Andrews is a 84 y.o. male  Hospital stay day 88, 8 Days Post-Op  s/p exploratory laparotomy with duodenal bleeding ulcer repair and stricturoplasty.  HPI: Complains of incisional pain, but otherwise not other issues reported.  ROS:  General: Denies weight loss, weight gain, fatigue, fevers, chills, and night sweats. Heart: Denies chest pain, palpitations, racing heart, irregular heartbeat, leg pain or swelling, and decreased activity tolerance. Respiratory: Denies breathing difficulty, shortness of breath, wheezing, cough, and sputum. GI: Denies change in appetite, heartburn, nausea, vomiting, constipation, diarrhea, and blood in stool. GU: Denies difficulty urinating, pain with urinating, urgency, frequency, blood in urine.   Objective:   Temp:  [98.1 F (36.7 C)-98.8 F (37.1 C)] 98.8 F (37.1 C) (05/09 0524) Pulse Rate:  [90-97] 93 (05/09 0524) Resp:  [16-18] 18 (05/09 0524) BP: (122-145)/(64-76) 129/72 (05/09 0524) SpO2:  [95 %-98 %] 95 % (05/09 0744) Weight:  [61.2 kg] 61.2 kg (05/09 0500)     Height: 5\' 5"  (165.1 cm) Weight: 61.2 kg BMI (Calculated): 22.45   Intake/Output this shift:   Intake/Output Summary (Last 24 hours) at 05/16/2018 1109 Last data filed at 05/16/2018 1046 Gross per 24 hour  Intake 3261.11 ml  Output 2410 ml  Net 851.11 ml    Constitutional :  alert, cooperative, appears stated age and no distress  Respiratory:  clear to auscultation bilaterally  Cardiovascular:  regular rate and rhythm  Gastrointestinal: soft, non-tender; bowel sounds normal; no masses,  no organomegaly.   Skin: Cool and moist. Midline staples c/d/i. Slight erythema at staple points, but no induration.  JP with serous drainage.  Psychiatric: Normal affect, non-agitated, not confused       LABS:  CMP Latest Ref Rng & Units 05/16/2018 05/15/2018 05/14/2018  Glucose 70 - 99 mg/dL 139(H) 151(H) 143(H)  BUN 8 - 23 mg/dL 32(H) 31(H) 30(H)  Creatinine 0.61 - 1.24 mg/dL  1.67(H) 1.56(H) 1.45(H)  Sodium 135 - 145 mmol/L 143 142 140  Potassium 3.5 - 5.1 mmol/L 4.0 4.2 4.2  Chloride 98 - 111 mmol/L 115(H) 114(H) 112(H)  CO2 22 - 32 mmol/L 23 22 22   Calcium 8.9 - 10.3 mg/dL 8.0(L) 7.8(L) 7.7(L)  Total Protein 6.5 - 8.1 g/dL - - 5.5(L)  Total Bilirubin 0.3 - 1.2 mg/dL - - 0.4  Alkaline Phos 38 - 126 U/L - - 86  AST 15 - 41 U/L - - 16  ALT 0 - 44 U/L - - 13   CBC Latest Ref Rng & Units 05/16/2018 05/15/2018 05/15/2018  WBC 4.0 - 10.5 K/uL 11.4(H) - 11.5(H)  Hemoglobin 13.0 - 17.0 g/dL 8.1(L) 8.6(L) 6.9(L)  Hematocrit 39.0 - 52.0 % 25.4(L) 26.9(L) 22.5(L)  Platelets 150 - 400 K/uL 371 - 355    RADS: n/a Assessment:   s/p exploratory laparotomy with duodenal bleeding ulcer repair and stricturoplasty.  JP with serous drainage, incision with no sign of infection.  Tolerating full liquid per RN report.    Recommend advancing to regular diet and stopping tube feeds and clamping to encourage PO intake.  Last speech eval noted no issues with aspiration.  RIJ central line dressing not intact, will remove and convert to PIV, to finish abx course, possible transition to oral per hospitalist team.  Staples removed at bedside today with no issues.  F/u disposition

## 2018-05-16 NOTE — Progress Notes (Signed)
[place] at [place] NAME: Justin Andrews    MR#:  [id number]  DATE OF BIRTH:  10/25/1934  SUBJECTIVE:  CHIEF COMPLAINT:   Chief Complaint  Patient presents with  . Fall  . Cold Exposure  No new complaint this morning.  No fevers.  Patient resting comfortably. Feeling follow with the tube feeds and refusing to eat by mouth  diarrhea improved and rectal tube was discontinued 05/14/2018  diet advanced from liquids to regular by surgery today  REVIEW OF SYSTEMS:  Review of Systems  Constitutional: Negative for chills and fever.  HENT: Negative for ear discharge and tinnitus.   Eyes: Negative for blurred vision and double vision.  Respiratory: Negative for shortness of breath.   Cardiovascular: Negative for chest pain and palpitations.  Gastrointestinal: Negative for diarrhea and heartburn.  Genitourinary: Negative for dysuria and urgency.  Musculoskeletal: Negative for myalgias and neck pain.  Skin: Negative for itching and rash.  Neurological: Negative for dizziness and headaches.  Psychiatric/Behavioral: Negative for depression and hallucinations.    DRUG ALLERGIES:   Allergies  Allergen Reactions  . Aspirin Other (See Comments)    bleeding   VITALS:  Blood pressure 129/72, pulse 93, temperature 98.8 F (37.1 C), temperature source Axillary, resp. rate 18, height 5\' 5"  (1.651 m), weight 61.2 kg, SpO2 95 %. PHYSICAL EXAMINATION:    Physical Exam  Constitutional: He is oriented to person, place, and time. He appears well-developed and well-nourished.  HENT:  Head: Normocephalic and atraumatic.  Eyes: Pupils are equal, round, and reactive to light. Conjunctivae and EOM are normal. No scleral icterus.  Neck: Normal range of motion. Neck supple. No tracheal deviation present.  Cardiovascular: Normal rate, regular rhythm and normal heart sounds.  Respiratory: Effort normal. He has rales.  GI: Soft. Bowel sounds are normal.  PEG  tube replaced.  In place.  Site of recent abdominal surgery well opposed with no signs of infection.  Musculoskeletal: Normal range of motion.        General: No edema.  Neurological: He is alert and oriented to person, place, and time.  Skin: Skin is warm. He is not diaphoretic. No erythema.  Psychiatric: He has a normal mood and affect. His behavior is normal.   LABORATORY PANEL:  Male CBC Recent Labs  Lab 05/16/18 0537  WBC 11.4*  HGB 8.1*  HCT 25.4*  PLT 371   ------------------------------------------------------------------------------------------------------------------ Chemistries  Recent Labs  Lab 05/14/18 0511  05/16/18 0537  NA 140   < > 143  K 4.2   < > 4.0  CL 112*   < > 115*  CO2 22   < > 23  GLUCOSE 143*   < > 139*  BUN 30*   < > 32*  CREATININE 1.45*   < > 1.67*  CALCIUM 7.7*   < > 8.0*  MG 1.9   < > 2.0  AST 16  --   --   ALT 13  --   --   ALKPHOS 86  --   --   BILITOT 0.4  --   --    < > = values in this interval not displayed.   RADIOLOGY:  No results found. ASSESSMENT AND PLAN:   1.Hemorrhagic shock  Secondary to acute blood loss anemia from GI bleed Patient status post laparotomy for bleeding duodenal ulcer status post ligation. Resolved with blood pressure stable.  Patient status post 4 units PRBC and FFP of transfusion  previously and hemoglobin stabilized.  Noted drop in hemoglobin to 6.9 this morning.  1 unit of packed red blood cell transfusion ordered by surgeon this morning.  He is on IV PPI twice daily. GI had suggested to do nuclear medicine bleeding scan which showed some active bleed in the rectal area. EGD was done which showed some bleed from duodenal area- cauterized.  Seems to patient had some bleed so  vascular surgeon did embolization.   He is s/p duodenoplasty and jejunostomy. Surgeon advance diet from  full liquid diet to regular today.   Tube feeds discontinued by surgery Central line discontinued 05/16/2018 IV meds changed to  p.o. Follow-up hemoglobin, PRBC transfusion PRN.  2. Acute renal failure on CKD stage III This is likely due to acute blood loss.  Monitor renal function closely  3.Hypertension Resumed norvasc.  IV Lopressor PRN.  4. History of alcoholism as per son Continue vitamin supplements.  5.  Diarrhea ; resolved.  Rectal tube discontinued. C. difficile colitis ruled out- diarrhea due to norovirus. Patient had history of C. difficile in recent past. P.o. vancomycin and Flagyl previously discontinued  6. Agitation.    Resolved.   Patient currently does not require a sitter    7. Pneumonia, possible HAP or aspiration pneumonia. The patient has cough, wheezing and shortness of breath.  Chest x-ray previously show bilateral multifocal pneumonia.  The patient had been on Invanz after surgery. Antibiotics changed to p.o. Omnicef Reevaluated by speech therapist and no evidence of aspiration on evaluation.   8. Acute respiratory failure with hypoxia, due to pneumonia. Being weaned off oxygen.  Treat pneumonia as above.  DVT prophylaxis; SCDs No heparin products due to anemia requiring multiple units of packed red blood cell transfusions   All the records are reviewed and case discussed with Care Management/Social Worker. Management plans discussed with the patient, family and they are in agreement.  CODE STATUS: DNR  TOTAL TIME TAKING CARE OF THIS PATIENT: 38 minutes.   More than 50% of the time was spent in counseling/coordination of care: YES  POSSIBLE D/C IN 2 DAYS, DEPENDING ON CLINICAL CONDITION.   Anticipate discharge to group home when medically stable.   Nicholes Mango M.D on 05/16/2018 at 3:32 PM  Between 7am to 6pm - Pager - [phone]  After 6pm go to [email or web address] - Technical brewer [facility]  Office  [phone]  CC: Primary care physician; Leone Haven, MD  Note: This dictation was prepared with Dragon dictation along with  smaller phrase technology. Any transcriptional errors that result from this process are unintentional.

## 2018-05-17 DIAGNOSIS — I499 Cardiac arrhythmia, unspecified: Secondary | ICD-10-CM

## 2018-05-17 LAB — CBC WITH DIFFERENTIAL/PLATELET
Abs Immature Granulocytes: 0.08 10*3/uL — ABNORMAL HIGH (ref 0.00–0.07)
Basophils Absolute: 0.1 10*3/uL (ref 0.0–0.1)
Basophils Relative: 1 %
Eosinophils Absolute: 0.6 10*3/uL — ABNORMAL HIGH (ref 0.0–0.5)
Eosinophils Relative: 5 %
HCT: 29.5 % — ABNORMAL LOW (ref 39.0–52.0)
Hemoglobin: 9.4 g/dL — ABNORMAL LOW (ref 13.0–17.0)
Immature Granulocytes: 1 %
Lymphocytes Relative: 6 %
Lymphs Abs: 0.7 10*3/uL (ref 0.7–4.0)
MCH: 29.3 pg (ref 26.0–34.0)
MCHC: 31.9 g/dL (ref 30.0–36.0)
MCV: 91.9 fL (ref 80.0–100.0)
Monocytes Absolute: 0.9 10*3/uL (ref 0.1–1.0)
Monocytes Relative: 8 %
Neutro Abs: 8.6 10*3/uL — ABNORMAL HIGH (ref 1.7–7.7)
Neutrophils Relative %: 79 %
Platelets: 469 10*3/uL — ABNORMAL HIGH (ref 150–400)
RBC: 3.21 MIL/uL — ABNORMAL LOW (ref 4.22–5.81)
RDW: 15.2 % (ref 11.5–15.5)
WBC: 10.9 10*3/uL — ABNORMAL HIGH (ref 4.0–10.5)
nRBC: 0 % (ref 0.0–0.2)

## 2018-05-17 LAB — BASIC METABOLIC PANEL
Anion gap: 7 (ref 5–15)
BUN: 32 mg/dL — ABNORMAL HIGH (ref 8–23)
CO2: 21 mmol/L — ABNORMAL LOW (ref 22–32)
Calcium: 8.3 mg/dL — ABNORMAL LOW (ref 8.9–10.3)
Chloride: 110 mmol/L (ref 98–111)
Creatinine, Ser: 1.84 mg/dL — ABNORMAL HIGH (ref 0.61–1.24)
GFR calc Af Amer: 39 mL/min — ABNORMAL LOW (ref >60)
GFR calc non Af Amer: 33 mL/min — ABNORMAL LOW (ref >60)
Glucose, Bld: 91 mg/dL (ref 70–99)
Potassium: 4 mmol/L (ref 3.5–5.1)
Sodium: 138 mmol/L (ref 135–145)

## 2018-05-17 LAB — PHOSPHORUS: Phosphorus: 3.2 mg/dL (ref 2.5–4.6)

## 2018-05-17 LAB — MAGNESIUM: Magnesium: 2.2 mg/dL (ref 1.7–2.4)

## 2018-05-17 MED ORDER — IPRATROPIUM-ALBUTEROL 0.5-2.5 (3) MG/3ML IN SOLN: 3.0000 mL | Freq: Four times a day (QID) | RESPIRATORY_TRACT | Status: DC | PRN

## 2018-05-17 MED ORDER — SODIUM CHLORIDE 0.9 % IV SOLN
INTRAVENOUS | Status: AC
  Administered 2018-05-17 – 2018-05-18 (×2): via INTRAVENOUS

## 2018-05-17 MED ORDER — ENSURE ENLIVE PO LIQD
237.0000 mL | Freq: Three times a day (TID) | ORAL | Status: DC
  Administered 2018-05-17 – 2018-05-26 (×16): 237 mL via ORAL

## 2018-05-17 NOTE — Progress Notes (Signed)
Subjective:  CC: Justin Andrews is a 84 y.o. male  Hospital stay day 15, 9 Days Post-Op  s/p exploratory laparotomy with duodenal bleeding ulcer repair and stricturoplasty.  HPI: Reports improved pain along incision site after staple removal yesterday.  No other acute complaints.    ROS:  General: Denies weight loss, weight gain, fatigue, fevers, chills, and night sweats. Heart: Denies chest pain, palpitations, racing heart, irregular heartbeat, leg pain or swelling, and decreased activity tolerance. Respiratory: Denies breathing difficulty, shortness of breath, wheezing, cough, and sputum. GI: Denies change in appetite, heartburn, nausea, vomiting, constipation, diarrhea, and blood in stool. GU: Denies difficulty urinating, pain with urinating, urgency, frequency, blood in urine.   Objective:   Temp:  [97.8 F (36.6 C)-98.3 F (36.8 C)] 97.8 F (36.6 C) (05/10 0509) Pulse Rate:  [82-89] 89 (05/10 0509) Resp:  [16-20] 19 (05/10 0509) BP: (119-131)/(60-71) 131/71 (05/10 0509) SpO2:  [95 %-97 %] 96 % (05/10 0509)     Height: 5\' 5"  (165.1 cm) Weight: 61.2 kg BMI (Calculated): 22.45   Intake/Output this shift:   Intake/Output Summary (Last 24 hours) at 05/17/2018 1259 Last data filed at 05/17/2018 1203 Gross per 24 hour  Intake -  Output 885 ml  Net -885 ml    Constitutional :  alert, cooperative, appears stated age and no distress  Respiratory:  clear to auscultation bilaterally  Cardiovascular:  regular rate and rhythm  Gastrointestinal: soft, non-tender; bowel sounds normal; no masses,  no organomegaly.   Skin: Cool and moist. Midline inicision intact and clean.Slight erythema remains at superior and inferior portions but no sign of obvious infection.  JP with serous drainage.  Psychiatric: Normal affect, non-agitated, not confused       LABS:  CMP Latest Ref Rng & Units 05/17/2018 05/16/2018 05/15/2018  Glucose 70 - 99 mg/dL 91 139(H) 151(H)  BUN 8 - 23 mg/dL 32(H) 32(H) 31(H)   Creatinine 0.61 - 1.24 mg/dL 1.84(H) 1.67(H) 1.56(H)  Sodium 135 - 145 mmol/L 138 143 142  Potassium 3.5 - 5.1 mmol/L 4.0 4.0 4.2  Chloride 98 - 111 mmol/L 110 115(H) 114(H)  CO2 22 - 32 mmol/L 21(L) 23 22  Calcium 8.9 - 10.3 mg/dL 8.3(L) 8.0(L) 7.8(L)  Total Protein 6.5 - 8.1 g/dL - - -  Total Bilirubin 0.3 - 1.2 mg/dL - - -  Alkaline Phos 38 - 126 U/L - - -  AST 15 - 41 U/L - - -  ALT 0 - 44 U/L - - -   CBC Latest Ref Rng & Units 05/17/2018 05/16/2018 05/15/2018  WBC 4.0 - 10.5 K/uL 10.9(H) 11.4(H) -  Hemoglobin 13.0 - 17.0 g/dL 9.4(L) 8.1(L) 8.6(L)  Hematocrit 39.0 - 52.0 % 29.5(L) 25.4(L) 26.9(L)  Platelets 150 - 400 K/uL 469(H) 371 -    RADS: n/a Assessment:   s/p exploratory laparotomy with duodenal bleeding ulcer repair and stricturoplasty.  JP with serous drainage, incision with no obvious sign of infection.    Now tolerating regular diet.  Stable from surgical standpoint.  Continue treatment for acute kidney injury and pneumonia per hospitalist team.  JP likely can be removed tomorrow.

## 2018-05-17 NOTE — Progress Notes (Signed)
Nutrition Follow-up  RD working remotely.  DOCUMENTATION CODES:   Not applicable  INTERVENTION:  Will discontinue Boost Breeze.  Provide Ensure Enlive po TID, each supplement provides 350 kcal and 20 grams of protein.  NUTRITION DIAGNOSIS:   Predicted suboptimal nutrient intake related to (hx EtOH abuse, concern for self-neglect) as evidenced by (per chart, slow weight loss from UBW of 150 lbs since 2016).  Ongoing.  GOAL:   Patient will meet greater than or equal to 90% of their needs  Progressing.  MONITOR:   Labs, Weight trends, I & O's, Skin, Other (Comment)(TPN)  REASON FOR ASSESSMENT:   Consult New TPN/TNA  ASSESSMENT:   84 year old male with PMHx of HTN, OA, tobacco abuse, EtOH abuse admitted with GI bleed secondary to oozing duodenal ulcer s/p bipolar cautery on 4/26, C Diff colitis, hemorrhagic shock.   -Patient taken to OR on 5/2. Underwent right IJ central line placement, exploratory laparotomy, EGD, duodenal stricture biopsy, 3 vessel ligation of duodenal bleeding ulcer, duodenoplasty, and placement of feeding jejunostomy. -On 5/4patient underwent upper GI series. No evidence of duodenal leak was found.  -On 5/4 diet was advanced to clear liquids and patient was started on Boost Breeze TID. TPN was reduced to 1/2 rate. -On 5/5 TPN was kept at 1/2 rate and tube feeds were started at trickle rate per J-tube. -On 5/6 TPN was discontinued and tube feeds were advanced to goal rate of 45 mL/hr.  Patient was assessed by SLP on 5/6 and 5/8 to assess if he was at risk for aspiration. He was found to not be at risk for aspiration of the liquid diet he was on at that time. Diet was advanced to full liquids on 5/7 and regular on 5/9. Tube feeds were discontinued on 5/9 and J-tube is now clamped. Spoke with RN over the phone. Patient is tolerating a regular diet. He is drinking more liquids than eating solids. He does not like the [employer] but did well with a strawberry  Ensure Enlive today. Per MD note rectal tube was discontinued on 5/7 and diarrhea has improved.  Enteral Access: J-tube placed 5/1 in OR  Medications reviewed and include: Augmentin, carvedilol, pantoprazole, NS @ 75 mL/hr.  Labs reviewed: CBG 116-145, CO2 21, BUN 32, Creatinine 1.84.  I/O: 550 ml UOP yesterday (0.4 mL/kg/hr) + 2 occurrences unmeasured UOP; BM x 2 yesterday  Diet Order:   Diet Order            Diet regular Room service appropriate? Yes; Fluid consistency: Thin  Diet effective now             EDUCATION NEEDS:   Not appropriate for education at this time  Skin:  Skin Assessment: Skin Integrity Issues:(MSAD to groin; ecchymosis; closed incision to abdomen)  Last BM:  05/16/2018 - medium type 6  Height:   Ht Readings from Last 1 Encounters:  05/03/18 5\' 5"  (1.651 m)   Weight:   Wt Readings from Last 1 Encounters:  05/16/18 61.2 kg   Ideal Body Weight:  61.8 kg  BMI:  Body mass index is 22.45 kg/m.  Estimated Nutritional Needs:   Kcal:  1700-1900kcal/day   Protein:  85-95g/day   Fluid:  1.5-1.7 L/day  Willey Blade, MS, RD, LDN Office: [phone] Pager: [phone] After Hours/Weekend Pager: [phone]

## 2018-05-17 NOTE — Progress Notes (Signed)
[place] [facility] [place] NAME: Justin Andrews    MR#:  [id number]  DATE OF BIRTH:  [birth year]  SUBJECTIVE:  CHIEF COMPLAINT:   Chief Complaint  Patient presents with  . Fall  . Cold Exposure  Denies any abdominal distention.  Tolerating p.o. diet Positive JP drain  diarrhea improved and rectal tube was discontinued 05/14/2018  diet advanced from liquids to regular by surgery today  REVIEW OF SYSTEMS:  Review of Systems  Constitutional: Negative for chills and fever.  HENT: Negative for ear discharge and tinnitus.   Eyes: Negative for blurred vision and double vision.  Respiratory: Negative for shortness of breath.   Cardiovascular: Negative for chest pain and palpitations.  Gastrointestinal: Negative for diarrhea and heartburn.  Genitourinary: Negative for dysuria and urgency.  Musculoskeletal: Negative for myalgias and neck pain.  Skin: Negative for itching and rash.  Neurological: Negative for dizziness and headaches.  Psychiatric/Behavioral: Negative for depression and hallucinations.    DRUG ALLERGIES:   Allergies  Allergen Reactions  . Aspirin Other (See Comments)    bleeding   VITALS:  Blood pressure 131/71, pulse 89, temperature 97.8 F (36.6 C), temperature source Oral, resp. rate 19, height 5\' 5"  (1.651 m), weight 61.2 kg, SpO2 96 %. PHYSICAL EXAMINATION:    Physical Exam  Constitutional: He is oriented to person, place, and time. He appears well-developed and well-nourished.  HENT:  Head: Normocephalic and atraumatic.  Eyes: Pupils are equal, round, and reactive to light. Conjunctivae and EOM are normal. No scleral icterus.  Neck: Normal range of motion. Neck supple. No tracheal deviation present.  Cardiovascular: Normal rate, regular rhythm and normal heart sounds.  Respiratory: Effort normal. He has rales.  GI: Soft. Bowel sounds are normal.  PEG tube replaced.  In place.  Site of recent abdominal surgery well  opposed with no signs of infection.  Musculoskeletal: Normal range of motion.        General: No edema.  Neurological: He is alert and oriented to person, place, and time.  Skin: Skin is warm. He is not diaphoretic. No erythema.  Psychiatric: He has a normal mood and affect. His behavior is normal.   LABORATORY PANEL:  Male CBC Recent Labs  Lab 05/17/18 0912  WBC 10.9*  HGB 9.4*  HCT 29.5*  PLT 469*   ------------------------------------------------------------------------------------------------------------------ Chemistries  Recent Labs  Lab 05/14/18 0511  05/17/18 0912  NA 140   < > 138  K 4.2   < > 4.0  CL 112*   < > 110  CO2 22   < > 21*  GLUCOSE 143*   < > 91  BUN 30*   < > 32*  CREATININE 1.45*   < > 1.84*  CALCIUM 7.7*   < > 8.3*  MG 1.9   < > 2.2  AST 16  --   --   ALT 13  --   --   ALKPHOS 86  --   --   BILITOT 0.4  --   --    < > = values in this interval not displayed.   RADIOLOGY:  No results found. ASSESSMENT AND PLAN:   1.Hemorrhagic shock  Secondary to acute blood loss anemia from GI bleed Patient status post laparotomy for bleeding duodenal ulcer status post ligation. Resolved with blood pressure stable.  Patient status post 4 units PRBC and FFP of transfusion previously and hemoglobin stabilized.  Noted drop in hemoglobin to 6.9 .  1 unit of packed red blood cell transfusion given he is on IV PPI twice daily. GI had suggested to do nuclear medicine bleeding scan which showed some active bleed in the rectal area. EGD was done which showed some bleed from duodenal area- cauterized.  Seems to patient had some bleed so  vascular surgeon did embolization.   He is s/p duodenoplasty and jejunostomy. Surgeon advance diet from  full liquid diet to regular today.   Tube feeds discontinued by surgery Central line discontinued 05/16/2018 IV meds changed to p.o. Follow-up hemoglobin, PRBC transfusion PRN.  2. Acute renal failure on CKD stage III Creatinine  baseline seems to be 1.5 -1.56 -1.67-1.84 Hydrate with IV fluids avoid nephrotoxins This is likely due to acute blood loss.  Monitor renal function closely  3.Hypertension Resumed norvasc.  IV Lopressor PRN.  4. History of alcoholism as per son Continue vitamin supplements.  5.  Diarrhea ; resolved.  Rectal tube discontinued. C. difficile colitis ruled out- diarrhea due to norovirus. Patient had history of C. difficile in recent past. P.o. vancomycin and Flagyl previously discontinued  6. Agitation.    Resolved.   Patient currently does not require a sitter    7. Pneumonia, possible HAP or aspiration pneumonia. The patient has cough, wheezing and shortness of breath.  Chest x-ray previously show bilateral multifocal pneumonia.  The patient had been on Invanz after surgery. Antibiotics changed to p.o. Omnicef Reevaluated by speech therapist and no evidence of aspiration on evaluation.   8. Acute respiratory failure with hypoxia, due to pneumonia. Being weaned off oxygen.  Treat pneumonia as above.  Telemetry sitter discontinued  DVT prophylaxis; SCDs No heparin products due to anemia requiring multiple units of packed red blood cell transfusions Physical therapy recommended home health PT  All the records are reviewed and case discussed with Care Management/Social Worker. Management plans discussed with the patient, family and they are in agreement.  CODE STATUS: DNR  TOTAL TIME TAKING CARE OF THIS PATIENT: 38 minutes.   More than 50% of the time was spent in counseling/coordination of care: YES  POSSIBLE D/C IN 1-2 DAYS, DEPENDING ON CLINICAL CONDITION.   Anticipate discharge to group home when medically stable.   Nicholes Mango M.D on 05/17/2018 at 12:56 PM  Between 7am to 6pm - Pager - [phone]  After 6pm go to [email or web address] - [employer] [facility]  Office  [phone]  CC: Primary care physician; Leone Haven,  MD  Note: This dictation was prepared with Dragon dictation along with smaller phrase technology. Any transcriptional errors that result from this process are unintentional.

## 2018-05-18 LAB — BASIC METABOLIC PANEL
Anion gap: 7 (ref 5–15)
BUN: 31 mg/dL — ABNORMAL HIGH (ref 8–23)
CO2: 21 mmol/L — ABNORMAL LOW (ref 22–32)
Calcium: 8.1 mg/dL — ABNORMAL LOW (ref 8.9–10.3)
Chloride: 116 mmol/L — ABNORMAL HIGH (ref 98–111)
Creatinine, Ser: 1.94 mg/dL — ABNORMAL HIGH (ref 0.61–1.24)
GFR calc Af Amer: 36 mL/min — ABNORMAL LOW (ref >60)
GFR calc non Af Amer: 31 mL/min — ABNORMAL LOW (ref >60)
Glucose, Bld: 107 mg/dL — ABNORMAL HIGH (ref 70–99)
Potassium: 3.9 mmol/L (ref 3.5–5.1)
Sodium: 144 mmol/L (ref 135–145)

## 2018-05-18 LAB — CBC WITH DIFFERENTIAL/PLATELET
Abs Immature Granulocytes: 0.04 10*3/uL (ref 0.00–0.07)
Basophils Absolute: 0 10*3/uL (ref 0.0–0.1)
Basophils Relative: 1 %
Eosinophils Absolute: 0.5 10*3/uL (ref 0.0–0.5)
Eosinophils Relative: 7 %
HCT: 27.8 % — ABNORMAL LOW (ref 39.0–52.0)
Hemoglobin: 8.6 g/dL — ABNORMAL LOW (ref 13.0–17.0)
Immature Granulocytes: 1 %
Lymphocytes Relative: 10 %
Lymphs Abs: 0.8 10*3/uL (ref 0.7–4.0)
MCH: 29.3 pg (ref 26.0–34.0)
MCHC: 30.9 g/dL (ref 30.0–36.0)
MCV: 94.6 fL (ref 80.0–100.0)
Monocytes Absolute: 0.9 10*3/uL (ref 0.1–1.0)
Monocytes Relative: 11 %
Neutro Abs: 5.4 10*3/uL (ref 1.7–7.7)
Neutrophils Relative %: 70 %
Platelets: 455 10*3/uL — ABNORMAL HIGH (ref 150–400)
RBC: 2.94 MIL/uL — ABNORMAL LOW (ref 4.22–5.81)
RDW: 15.2 % (ref 11.5–15.5)
WBC: 7.6 10*3/uL (ref 4.0–10.5)
nRBC: 0 % (ref 0.0–0.2)

## 2018-05-18 LAB — CULTURE, BLOOD (ROUTINE X 2)
Culture: NO GROWTH
Culture: NO GROWTH

## 2018-05-18 LAB — MAGNESIUM: Magnesium: 2.2 mg/dL (ref 1.7–2.4)

## 2018-05-18 LAB — PHOSPHORUS: Phosphorus: 3.1 mg/dL (ref 2.5–4.6)

## 2018-05-18 NOTE — Progress Notes (Signed)
PT Cancellation Note  Patient Details Name: Justin Andrews MRN: [id number] DOB: [birth year]   Cancelled Treatment:    Reason Eval/Treat Not Completed: Patient declined, no reason specified. Pt declines, "I don't need therapy". Re attempt at a later time/date.   Larae Grooms, PTA 05/18/2018, 11:36 AM

## 2018-05-18 NOTE — Progress Notes (Signed)
[place] [facility] [place] NAME: Alphonsa Brickle    MR#:  [id number]  DATE OF BIRTH:  January 04, 1935  SUBJECTIVE:  CHIEF COMPLAINT:   Chief Complaint  Patient presents with  . Fall  . Cold Exposure  Denies any abdominal distention.  Tolerating p.o. regular diet Intermittent episodes of confusion noticed but reorientable Positive JP drain  diarrhea improved and rectal tube was discontinued 05/14/2018  diet advanced from liquids to regular by surgery today  REVIEW OF SYSTEMS:  Review of Systems  Constitutional: Negative for chills and fever.  HENT: Negative for ear discharge and tinnitus.   Eyes: Negative for blurred vision and double vision.  Respiratory: Negative for shortness of breath.   Cardiovascular: Negative for chest pain and palpitations.  Gastrointestinal: Negative for diarrhea and heartburn.  Genitourinary: Negative for dysuria and urgency.  Musculoskeletal: Negative for myalgias and neck pain.  Skin: Negative for itching and rash.  Neurological: Negative for dizziness and headaches.  Psychiatric/Behavioral: Negative for depression and hallucinations.    DRUG ALLERGIES:   Allergies  Allergen Reactions  . Aspirin Other (See Comments)    bleeding   VITALS:  Blood pressure (!) 111/55, pulse 84, temperature 98.2 F (36.8 C), temperature source Oral, resp. rate 16, height 5\' 5"  (1.651 m), weight 61.2 kg, SpO2 95 %. PHYSICAL EXAMINATION:    Physical Exam  Constitutional: He is oriented to person, place, and time. He appears well-developed and well-nourished.  HENT:  Head: Normocephalic and atraumatic.  Eyes: Pupils are equal, round, and reactive to light. Conjunctivae and EOM are normal. No scleral icterus.  Neck: Normal range of motion. Neck supple. No tracheal deviation present.  Cardiovascular: Normal rate, regular rhythm and normal heart sounds.  Respiratory: Effort normal. He has rales.  GI: Soft. Bowel sounds are normal.   PEG tube replaced.  In place.  Site of recent abdominal surgery well opposed with no signs of infection.  Musculoskeletal: Normal range of motion.        General: No edema.  Neurological: He is alert and oriented to person, place, and time.  Skin: Skin is warm. He is not diaphoretic. No erythema.  Psychiatric: He has a normal mood and affect. His behavior is normal.   LABORATORY PANEL:  Male CBC Recent Labs  Lab 05/18/18 0423  WBC 7.6  HGB 8.6*  HCT 27.8*  PLT 455*   ------------------------------------------------------------------------------------------------------------------ Chemistries  Recent Labs  Lab 05/14/18 0511  05/18/18 0423  NA 140   < > 144  K 4.2   < > 3.9  CL 112*   < > 116*  CO2 22   < > 21*  GLUCOSE 143*   < > 107*  BUN 30*   < > 31*  CREATININE 1.45*   < > 1.94*  CALCIUM 7.7*   < > 8.1*  MG 1.9   < > 2.2  AST 16  --   --   ALT 13  --   --   ALKPHOS 86  --   --   BILITOT 0.4  --   --    < > = values in this interval not displayed.   RADIOLOGY:  No results found. ASSESSMENT AND PLAN:   1.Hemorrhagic shock  Secondary to acute blood loss anemia from GI bleed Patient status post laparotomy for bleeding duodenal ulcer status post ligation. Resolved with blood pressure stable.  Patient status post 4 units PRBC and FFP of transfusion previously and hemoglobin stabilized.  Noted drop in hemoglobin to 6.9 .  1 unit of packed red blood cell transfusion given he is on IV PPI twice daily. GI had suggested to do nuclear medicine bleeding scan which showed some active bleed in the rectal area. EGD was done which showed some bleed from duodenal area- cauterized.  Seems to patient had some bleed so  vascular surgeon did embolization.   He is s/p duodenoplasty and jejunostomy. Surgeon advance diet from  full liquid diet to regular today.   Tube feeds discontinued by surgery Central line discontinued 05/16/2018 IV meds changed to p.o. Hemoglobin at 8.6.  Check  CBC in a.m. PRBC transfusion PRN.  2. Acute renal failure on CKD stage III IV fluids and check BMP in a.m. Creatinine baseline seems to be 1.5 -1.6 seems to be his baseline -1.84-1.94  Monitor renal function closely  3.Hypertension Resumed norvasc.  IV Lopressor PRN.  4. History of alcoholism as per son Continue vitamin supplements.  5.  Diarrhea ; resolved.  Rectal tube discontinued. C. difficile colitis ruled out- diarrhea due to norovirus. Patient had history of C. difficile in recent past. P.o. vancomycin and Flagyl previously discontinued  6. Agitation.    Resolved.   Patient currently does not require a sitter    7. Pneumonia, possible HAP or aspiration pneumonia. The patient has cough, wheezing and shortness of breath.  Chest x-ray previously show bilateral multifocal pneumonia.  The patient had been on Invanz after surgery. Antibiotics changed to p.o. Omnicef completed the course and discontinue antibiotics Reevaluated by speech therapist and no evidence of aspiration on evaluation.   8. Acute respiratory failure with hypoxia, due to pneumonia. Being weaned off oxygen.  Treat pneumonia as above.  Telemetry sitter discontinued  DVT prophylaxis; SCDs No heparin products due to anemia requiring multiple units of packed red blood cell transfusions Physical therapy recommended home health  PT follow-up with social worker regarding disposition as patient is under DSS  All the records are reviewed and case discussed with Care Management/Social Worker. Management plans discussed with the patient, he is in agreement.  CODE STATUS: DNR  TOTAL TIME TAKING CARE OF THIS PATIENT: 25minutes.   More than 50% of the time was spent in counseling/coordination of care: YES  POSSIBLE D/C IN 1-2 DAYS, DEPENDING ON CLINICAL CONDITION.   Anticipate discharge to group home when medically stable.   Nicholes Mango M.D on 05/18/2018 at 3:06 PM  Between 7am to 6pm - Pager -  [phone]  After 6pm go to [email or web address] - [employer] [facility]  Office  [phone]  CC: Primary care physician; Leone Haven, MD  Note: This dictation was prepared with Dragon dictation along with smaller phrase technology. Any transcriptional errors that result from this process are unintentional.

## 2018-05-18 NOTE — Progress Notes (Signed)
05/18/2018  Subjective: Patient is 10 Days Post-Op.  Patient tried to get up out of bed this morning and also had a stool accident.  However, was able to see that his stool on the bed had no blood in it.  He reports "5-10% of pain".  No fevers.  Vital signs: Temp:  [98.1 F (36.7 C)-98.2 F (36.8 C)] 98.2 F (36.8 C) (05/11 0411) Pulse Rate:  [72-84] 84 (05/11 0411) Resp:  [16-20] 16 (05/11 0411) BP: (107-111)/(55-61) 111/55 (05/11 0411) SpO2:  [93 %-96 %] 95 % (05/11 0411)   Intake/Output: 05/10 0701 - 05/11 0700 In: 438.6 [P.O.:237; I.V.:201.6] Out: 1265 [Urine:1150; Drains:115] Last BM Date: 05/16/18  Physical Exam: Constitutional:  No acute distress Abdomen:  Soft, non-distended, non-tender to palpation with only some soreness at the midline incision.  Midline incision is clean, dry, intact, with only some mild erythema at the superior and inferior portions, but no evidence of actual wound infection.  JP drain with serous fluid.  Feeding tube in place without any surrounding leak, capped.  Labs:  Recent Labs    05/17/18 0912 05/18/18 0423  WBC 10.9* 7.6  HGB 9.4* 8.6*  HCT 29.5* 27.8*  PLT 469* 455*   Recent Labs    05/17/18 0912 05/18/18 0423  NA 138 144  K 4.0 3.9  CL 110 116*  CO2 21* 21*  GLUCOSE 91 107*  BUN 32* 31*  CREATININE 1.84* 1.94*  CALCIUM 8.3* 8.1*   No results for input(s): LABPROT, INR in the last 72 hours.  Imaging: No results found.  Assessment/Plan: This is a 84 y.o. male s/p exlap, repair of bleeding duodenal ulcer, stricture biopsy, duodenoplasty, feeding tube placement.  --Patient still having some intermittent confusion and delirium but is reorientable and able to carry a conversation.  He is asking when he would be able to go home. --Continue regular diet today with nutritional supplements.  No tube feeds needed as long as he can maintain appropriate caloric intake per Nutrition team recs. --Agree with gentle hydration for his  worsening creatinine. --Though his Hgb is a lower today, there is no evidence of any active bleeding and his stool is non-bloody.  No transfusions needed at this point. --His JP drain had total 115 ml out over 24 hours.  Too high output at this point to remove.   --Feeding tube will need to remain in place for total of 6-8 weeks even if it is not used. --Patient is approaching being ready for discharge.  Patient had refused to work with PT/OT recently, so placement recommendations pending.   Melvyn Neth, [facility]

## 2018-05-18 NOTE — Progress Notes (Signed)
OT Cancellation Note  Patient Details Name: Justin Andrews MRN: [id number] DOB: [birth year]   Cancelled Treatment:    Reason Eval/Treat Not Completed: Patient declined, no reason specified. Upon arrival to pt room, pt awake and supine in bed. Pt pleasantly declined to participate in OT tx at this time. Requested OT return next day. Will re-attempt at a later time/date as available and pt medically appropriate for OT tx.   Shara Blazing, M.S., OTR/L Ascom: [phone] 05/18/18, 3:04 PM

## 2018-05-19 LAB — TYPE AND SCREEN
ABO/RH(D): A POS
Antibody Screen: NEGATIVE
Unit division: 0

## 2018-05-19 LAB — CBC WITH DIFFERENTIAL/PLATELET
Abs Immature Granulocytes: 0.04 10*3/uL (ref 0.00–0.07)
Basophils Absolute: 0 10*3/uL (ref 0.0–0.1)
Basophils Relative: 1 %
Eosinophils Absolute: 0.5 10*3/uL (ref 0.0–0.5)
Eosinophils Relative: 6 %
HCT: 25.9 % — ABNORMAL LOW (ref 39.0–52.0)
Hemoglobin: 8.1 g/dL — ABNORMAL LOW (ref 13.0–17.0)
Immature Granulocytes: 1 %
Lymphocytes Relative: 12 %
Lymphs Abs: 1 10*3/uL (ref 0.7–4.0)
MCH: 29.3 pg (ref 26.0–34.0)
MCHC: 31.3 g/dL (ref 30.0–36.0)
MCV: 93.8 fL (ref 80.0–100.0)
Monocytes Absolute: 1 10*3/uL (ref 0.1–1.0)
Monocytes Relative: 12 %
Neutro Abs: 5.7 10*3/uL (ref 1.7–7.7)
Neutrophils Relative %: 68 %
Platelets: 464 10*3/uL — ABNORMAL HIGH (ref 150–400)
RBC: 2.76 MIL/uL — ABNORMAL LOW (ref 4.22–5.81)
RDW: 15.1 % (ref 11.5–15.5)
WBC: 8.2 10*3/uL (ref 4.0–10.5)
nRBC: 0 % (ref 0.0–0.2)

## 2018-05-19 LAB — BASIC METABOLIC PANEL
Anion gap: 7 (ref 5–15)
BUN: 25 mg/dL — ABNORMAL HIGH (ref 8–23)
CO2: 23 mmol/L (ref 22–32)
Calcium: 8 mg/dL — ABNORMAL LOW (ref 8.9–10.3)
Chloride: 112 mmol/L — ABNORMAL HIGH (ref 98–111)
Creatinine, Ser: 1.93 mg/dL — ABNORMAL HIGH (ref 0.61–1.24)
GFR calc Af Amer: 37 mL/min — ABNORMAL LOW (ref >60)
GFR calc non Af Amer: 32 mL/min — ABNORMAL LOW (ref >60)
Glucose, Bld: 84 mg/dL (ref 70–99)
Potassium: 3.5 mmol/L (ref 3.5–5.1)
Sodium: 142 mmol/L (ref 135–145)

## 2018-05-19 LAB — BPAM RBC
Blood Product Expiration Date: 202005292359
ISSUE DATE / TIME: 202005081027
Unit Type and Rh: 6200

## 2018-05-19 MED ORDER — NICOTINE 14 MG/24HR TD PT24
14.0000 mg | MEDICATED_PATCH | Freq: Every day | TRANSDERMAL | Status: DC
  Administered 2018-05-19 – 2018-05-26 (×6): 14 mg via TRANSDERMAL
  Filled 2018-05-19 (×6): qty 1

## 2018-05-19 NOTE — Progress Notes (Addendum)
[place] [facility] [place] NAME: Justin Andrews    MR#:  [id number]  DATE OF BIRTH:  1934/10/07  SUBJECTIVE:   Patient without complaint, no events overnight, case discussed with the patient's son-the patient does not have a place to go, awaiting SNF REVIEW OF SYSTEMS:  Review of Systems  Constitutional: Negative for chills and fever.  HENT: Negative for ear discharge and tinnitus.   Eyes: Negative for blurred vision and double vision.  Respiratory: Negative for shortness of breath.   Cardiovascular: Negative for chest pain and palpitations.  Gastrointestinal: Negative for diarrhea and heartburn.  Genitourinary: Negative for dysuria and urgency.  Musculoskeletal: Negative for myalgias and neck pain.  Skin: Negative for itching and rash.  Neurological: Negative for dizziness and headaches.  Psychiatric/Behavioral: Negative for depression and hallucinations.    DRUG ALLERGIES:   Allergies  Allergen Reactions  . Aspirin Other (See Comments)    bleeding   VITALS:  Blood pressure 136/68, pulse 89, temperature 98.4 F (36.9 C), resp. rate 19, height 5\' 5"  (1.651 m), weight 61.2 kg, SpO2 96 %. PHYSICAL EXAMINATION:    Physical Exam  Constitutional: He is oriented to person, place, and time. He appears well-developed and well-nourished.  HENT:  Head: Normocephalic and atraumatic.  Eyes: Pupils are equal, round, and reactive to light. Conjunctivae and EOM are normal. No scleral icterus.  Neck: Normal range of motion. Neck supple. No tracheal deviation present.  Cardiovascular: Normal rate, regular rhythm and normal heart sounds.  Respiratory: Effort normal. He has rales.  GI: Soft. Bowel sounds are normal.  PEG tube replaced.  In place.  Site of recent abdominal surgery well opposed with no signs of infection.  Musculoskeletal: Normal range of motion.        General: No edema.  Neurological: He is alert and oriented to person, place, and  time.  Skin: Skin is warm. He is not diaphoretic. No erythema.  Psychiatric: He has a normal mood and affect. His behavior is normal.   LABORATORY PANEL:  Male CBC Recent Labs  Lab 05/19/18 0433  WBC 8.2  HGB 8.1*  HCT 25.9*  PLT 464*   ------------------------------------------------------------------------------------------------------------------ Chemistries  Recent Labs  Lab 05/14/18 0511  05/18/18 0423 05/19/18 0433  NA 140   < > 144 142  K 4.2   < > 3.9 3.5  CL 112*   < > 116* 112*  CO2 22   < > 21* 23  GLUCOSE 143*   < > 107* 84  BUN 30*   < > 31* 25*  CREATININE 1.45*   < > 1.94* 1.93*  CALCIUM 7.7*   < > 8.1* 8.0*  MG 1.9   < > 2.2  --   AST 16  --   --   --   ALT 13  --   --   --   ALKPHOS 86  --   --   --   BILITOT 0.4  --   --   --    < > = values in this interval not displayed.   RADIOLOGY:  No results found. ASSESSMENT AND PLAN:  *Acute Hemorrhagic shock  Resolved Secondary to acute blood loss anemia from GI bleed S/P Exp laparotomy for bleeding duodenal ulcer s/p duodenal plasty/jejunostomy by general surgery, s/p 4 units PRBC/FFP, tolerated diet, NM bleeding scan had shown some active bleed in the rectal area. EGD shown some bleed from duodenal area - s/p cautery Tube feeds  discontinued by surgery  *AKI w/ CKD stage III Largely resolved with IV fluids for rehydration Bl Cr 1.5 -1.6  Continue to avoid nephrotoxic agents   *Chronic hypertension  Stable on current regiment   *History of alcoholism  Stable  Alcohol withdrawal protocol while in house   *Acute diarrhea  Resolved due to norovirus. Patient had history of C. difficile in recent past.  * Pneumonia, possible HAP or aspiration pneumonia Treated with course of antibiotics while in house  *Acute respiratory failure with hypoxia due to pneumonia Resolved Successfully weaned off oxygen  DVT prophylaxis; SCDs No heparin products due to anemia requiring multiple units of packed  red blood cell transfusions OT recommending SNF, SW assisting with disposition planning, DSS following Case was discussed with the patient's son with all questions answered  All the records are reviewed and case discussed with Care Management/Social Worker. Management plans discussed with the patient, he is in agreement.  CODE STATUS: DNR  TOTAL TIME TAKING CARE OF THIS PATIENT: 12minutes.   More than 50% of the time was spent in counseling/coordination of care: YES  POSSIBLE D/C IN 1-2 DAYS, DEPENDING ON CLINICAL CONDITION.   Anticipate discharge to group home when medically stable.   Avel Peace Salary M.D on 05/19/2018 at 1:16 PM  Between 7am to 6pm - Pager - [phone]  After 6pm go to [email or web address] - Technical brewer [facility]  Office  [phone]  CC: Primary care physician; Leone Haven, MD  Note: This dictation was prepared with Dragon dictation along with smaller phrase technology. Any transcriptional errors that result from this process are unintentional.

## 2018-05-19 NOTE — Progress Notes (Signed)
Occupational Therapy Treatment Patient Details Name: Justin Andrews MRN: [id number] DOB: 03/24/1934 Today's Date: 05/19/2018    History of present illness Justin Andrews is an 84 yo male who comes to [facility] 4/25, found on the floor by neighbors minimally responsive in his stool/blood. Upon arrival, hypothermic, hypotensive on arrival and tachypneic, Hb: 3s. Per son, pt was advised to go to hospital by homehealth RN, but pt did not. Later afte rmore dark stools family called EMS, but pt refused to go to ED. PMH: C-diff, CKD, HTN, ETOH abuse, cigarrette use, Lt hip fracture Nov 2019 s/p ORIF and STR stay, recent fall with Right hip pain. Since admission pt is s/p multiple units PRBC and vascular procedure to address GI bleed.  Pt went back to OR 5/1 for ex-lab to address continued GIB.    OT comments  Pt seen for OT tx this date. Pt alert and agreeable to session. Pt reporting abdominal pain, RN notified. Pt participated in functional transfer/mobility training for ADL tasks. Supervision for supine>sit EOB, CGA to stand and ambulate without AD to recliner. Mildly unsteady. Pt reports he would like to take a long walk later with a RW. PT notified. Pt demonstrates improvement in his ability to follow commands and participate in therapy this date. Pt continues to benefit from skilled OT services to maximize safety/indep and return to PLOF by addressing impairments in pain, decreased activity tolerance, and cognition. Continue to recommend STR.   Follow Up Recommendations  SNF;Supervision/Assistance - 24 hour    Equipment Recommendations  None recommended by OT    Recommendations for Other Services      Precautions / Restrictions Precautions Precautions: Fall Precaution Comments: a bit confused Restrictions Weight Bearing Restrictions: No       Mobility Bed Mobility Overal bed mobility: Needs Assistance       Supine to sit: Supervision     General bed mobility comments: no difficulty  noted  Transfers Overall transfer level: Needs assistance Equipment used: None Transfers: Sit to/from Stand Sit to Stand: Min guard         General transfer comment: no difficulty noted, CGA/handheld asssit    Balance Overall balance assessment: Needs assistance Sitting-balance support: No upper extremity supported;Feet supported Sitting balance-Leahy Scale: Good     Standing balance support: During functional activity;Single extremity supported Standing balance-Leahy Scale: Fair                             ADL either performed or assessed with clinical judgement   ADL Overall ADL's : Needs assistance/impaired Eating/Feeding: Set up;Sitting   Grooming: Set up;Sitting                                       Vision Patient Visual Report: No change from baseline     Perception     Praxis      Cognition Arousal/Alertness: Awake/alert Behavior During Therapy: WFL for tasks assessed/performed Overall Cognitive Status: No family/caregiver present to determine baseline cognitive functioning                                 General Comments: continues to be a bit confused, but alert and oriented to place, pleasant, follows commands (HOH though),         Exercises  Shoulder Instructions       General Comments      Pertinent Vitals/ Pain       Pain Assessment: 0-10 Pain Score: 7  Pain Location: abdomen area Pain Descriptors / Indicators: Aching;Discomfort;Grimacing;Constant Pain Intervention(s): Limited activity within patient's tolerance;Monitored during session;Repositioned;Patient requesting pain meds-RN notified  Home Living                                          Prior Functioning/Environment              Frequency  Min 2X/week        Progress Toward Goals  OT Goals(current goals can now be found in the care plan section)  Progress towards OT goals: Progressing toward  goals  Acute Rehab OT Goals Patient Stated Goal: regain strength upon return to home to maintain independence  OT Goal Formulation: With patient Time For Goal Achievement: 05/26/18 Potential to Achieve Goals: Good  Plan Frequency remains appropriate;Discharge plan remains appropriate    Co-evaluation                 AM-PAC OT "6 Clicks" Daily Activity     Outcome Measure   Help from another person eating meals?: None Help from another person taking care of personal grooming?: None Help from another person toileting, which includes using toliet, bedpan, or urinal?: A Little Help from another person bathing (including washing, rinsing, drying)?: A Lot Help from another person to put on and taking off regular upper body clothing?: A Little Help from another person to put on and taking off regular lower body clothing?: A Lot 6 Click Score: 18    End of Session    OT Visit Diagnosis: Other abnormalities of gait and mobility (R26.89);History of falling (Z91.81);Pain Pain - Right/Left: Right Pain - part of body: Leg   Activity Tolerance Patient tolerated treatment well   Patient Left in chair;with call bell/phone within reach;with chair alarm set   Nurse Communication Patient requests pain meds        Time: 7078-6754 OT Time Calculation (min): 23 min  Charges: OT General Charges $OT Visit: 1 Visit OT Treatments $Therapeutic Activity: 23-37 mins  Jeni Salles, MPH, MS, OTR/L ascom [phone] 05/19/18, 12:01 PM

## 2018-05-19 NOTE — Progress Notes (Addendum)
[facility] Hospital Day(s): [place] op day(s): 11 Days Post-Op.   Interval History: Patient seen and examined, no acute events or new complaints overnight. Patient reports that he is feeling good. He reports some incisional pain but overall notes he is feeling good. No reports of fever, chills, nausea, or emesis. He continues to endorse bowel movements which are reportedly without blood. Tolerating regular diet without issue; not requiring any TF supplementation. Hemoglobin continues to trend down slowly (8.1 this morning) without evidence of active bleeding. Renal function elevated but stable. JP with 90 ccs serous output in last 24 hours.    Review of Systems:  Constitutional: denies fever, chills  Respiratory: denies any shortness of breath  Cardiovascular: denies chest pain or palpitations  Gastrointestinal: + abdominal pain (incisional), denied N/V, or diarrhea/and bowel function as per interval history Musculoskeletal: denies pain, decreased motor or sensation Integumentary: + Midline incision   Vital signs in last 24 hours: [min-max] current  Temp:  [97.8 F (36.6 C)-98.3 F (36.8 C)] 98.1 F (36.7 C) (05/12 0355) Pulse Rate:  [81-86] 86 (05/12 0355) Resp:  [16-19] 19 (05/12 0355) BP: (113-130)/(59-63) 123/60 (05/12 0355) SpO2:  [96 %-100 %] 97 % (05/12 0355)     Height: 5\' 5"  (165.1 cm) Weight: 61.2 kg BMI (Calculated): 22.45   Intake/Output this shift:  No intake/output data recorded.    Physical Exam:  Constitutional: alert, cooperative and no distress  Respiratory: breathing non-labored at rest  Cardiovascular: regular rate and sinus rhythm  Gastrointestinal: soft, non-tender, and non-distended. Red rubber J-tube in left abdomen. JP in right abdomen with serous output.  Integumentary: Staple removed from midline incision, healing well. No erythema.    Labs:  CBC Latest Ref Rng & Units 05/19/2018 05/18/2018 05/17/2018  WBC  4.0 - 10.5 K/uL 8.2 7.6 10.9(H)  Hemoglobin 13.0 - 17.0 g/dL 8.1(L) 8.6(L) 9.4(L)  Hematocrit 39.0 - 52.0 % 25.9(L) 27.8(L) 29.5(L)  Platelets 150 - 400 K/uL 464(H) 455(H) 469(H)   CMP Latest Ref Rng & Units 05/19/2018 05/18/2018 05/17/2018  Glucose 70 - 99 mg/dL 84 107(H) 91  BUN 8 - 23 mg/dL 25(H) 31(H) 32(H)  Creatinine 0.61 - 1.24 mg/dL 1.93(H) 1.94(H) 1.84(H)  Sodium 135 - 145 mmol/L 142 144 138  Potassium 3.5 - 5.1 mmol/L 3.5 3.9 4.0  Chloride 98 - 111 mmol/L 112(H) 116(H) 110  CO2 22 - 32 mmol/L 23 21(L) 21(L)  Calcium 8.9 - 10.3 mg/dL 8.0(L) 8.1(L) 8.3(L)  Total Protein 6.5 - 8.1 g/dL - - -  Total Bilirubin 0.3 - 1.2 mg/dL - - -  Alkaline Phos 38 - 126 U/L - - -  AST 15 - 41 U/L - - -  ALT 0 - 44 U/L - - -    Imaging studies: No new pertinent imaging studies   Assessment/Plan: 84 y.o. male with slow downward trend in hemoglobin without evidence of active bleeding, maybe delayed equilibration, otherwise doing well 11 Days Post-Op s/p exploratory Laparotomy, EDG, duodenal stricture biopsy, 3 vessel ligation of duodenal bleeding ulcer, duodenoplasty, feeding jejunostomy for bleeding duodenal ulcer.   - Regular diet + nutritional supplementation; dietitian following   - Gentle IVF hydration   - Pain control prn  - Monitor hgb; no signs of re-bleeding  - Continue JP drain   - Medical management of comorbidities per primary team   - Encouraged him to work with PT this morning to help determine disposition   - Discharge planning: Once  hemoglobin stable and working with PT, will be okay for discharge from surgical standpoint; J-tube cannot be removed for 6-8 weeks after surgery.   All of the above findings and recommendations were discussed with the patient, and the medical team, and all of patient's questions were answered to his expressed satisfaction.  -- Edison Simon, PA-C [facility] 05/19/2018, 7:21 AM [phone] M-F: 7am - 4pm  I saw and evaluated  the patient.  I agree with the above documentation, exam, and plan, which I have edited where appropriate. Fredirick Maudlin  10:58 AM

## 2018-05-19 NOTE — Progress Notes (Signed)
Physical Therapy Treatment Patient Details Name: Justin Andrews MRN: [id number] DOB: 08/08/1934 Today's Date: 05/19/2018    History of Present Illness Finch Trickey is an 84 yo male who comes to [facility] 4/25, found on the floor by neighbors minimally responsive in his stool/blood. Upon arrival, hypothermic, hypotensive on arrival and tachypneic, Hb: 3s. Per son, pt was advised to go to hospital by homehealth RN, but pt did not. Later afte rmore dark stools family called EMS, but pt refused to go to ED. PMH: C-diff, CKD, HTN, ETOH abuse, cigarrette use, Lt hip fracture Nov 2019 s/p ORIF and STR stay, recent fall with Right hip pain. Since admission pt is s/p multiple units PRBC and vascular procedure to address GI bleed.  Pt went back to OR 5/1 for ex-lab to address continued GIB.     PT Comments    Pt in bed asleep upon entry, awakes to authors greeting, pt agreeable to participate. Pt educated on logroll for pain management during bed mobility. Pt able to perform 11 STS transfers in session with RW and good control of trunk in space, no LOB. Pt AMB 112ft in room with several turns, some LOB while turning, but pt largely able to manage RW without assistance. Pt progressing well, but still has very limited stamina overall, much increased ABD pain after mobility, pt asking to return to bed.    Follow Up Recommendations  Supervision/Assistance - 24 hour;SNF;Supervision for mobility/OOB     Equipment Recommendations  None recommended by PT    Recommendations for Other Services       Precautions / Restrictions Precautions Precautions: Fall Precaution Comments: Still mildly confused but I suspect close to baseline.  Restrictions Weight Bearing Restrictions: No    Mobility  Bed Mobility Overal bed mobility: Needs Assistance Bed Mobility: Rolling Rolling: Min assist   Supine to sit: Min assist     General bed mobility comments: education on log rolling technique  Transfers Overall transfer  level: Needs assistance Equipment used: Rolling walker (2 wheeled) Transfers: Sit to/from Stand Sit to Stand: Supervision         General transfer comment: performed 10x from EOB for strengthening and balance training   Ambulation/Gait Ambulation/Gait assistance: Min guard Gait Distance (Feet): 150 Feet Assistive device: Rolling walker (2 wheeled) Gait Pattern/deviations: Step-to pattern;Drifts right/left     General Gait Details: VC to move more carefully with turns as he has LOB    Chief Strategy Officer    Modified Rankin (Stroke Patients Only)       Balance Overall balance assessment: Needs assistance Sitting-balance support: No upper extremity supported;Feet supported Sitting balance-Leahy Scale: Good     Standing balance support: During functional activity;Single extremity supported Standing balance-Leahy Scale: Fair Standing balance comment: some LOB with RW during turns and straight plane AMB                             Cognition Arousal/Alertness: Awake/alert Behavior During Therapy: WFL for tasks assessed/performed Overall Cognitive Status: Within Functional Limits for tasks assessed                                 General Comments: continues to be a bit confused, but alert and oriented to place, pleasant, follows commands (HOH though),       Exercises  General Comments        Pertinent Vitals/Pain Pain Assessment: Faces Pain Score: 7  Faces Pain Scale: Hurts even more Pain Location: ABD incision  Pain Descriptors / Indicators: Aching;Discomfort;Grimacing;Constant Pain Intervention(s): Limited activity within patient's tolerance;Monitored during session;Premedicated before session;Repositioned    Home Living                      Prior Function            PT Goals (current goals can now be found in the care plan section) Acute Rehab PT Goals Patient Stated Goal: regain strength  upon return to home to maintain independence  PT Goal Formulation: With patient Time For Goal Achievement: 05/25/18 Potential to Achieve Goals: Good Progress towards PT goals: Progressing toward goals    Frequency    Min 2X/week      PT Plan Current plan remains appropriate    Co-evaluation              AM-PAC PT "6 Clicks" Mobility   Outcome Measure  Help needed turning from your back to your side while in a flat bed without using bedrails?: A Little Help needed moving from lying on your back to sitting on the side of a flat bed without using bedrails?: A Little Help needed moving to and from a bed to a chair (including a wheelchair)?: A Little Help needed standing up from a chair using your arms (e.g., wheelchair or bedside chair)?: None Help needed to walk in hospital room?: A Little Help needed climbing 3-5 steps with a railing? : A Lot 6 Click Score: 18    End of Session Equipment Utilized During Treatment: Gait belt Activity Tolerance: Patient tolerated treatment well;Patient limited by fatigue Patient left: with call bell/phone within reach;in bed;with bed alarm set Nurse Communication: Mobility status PT Visit Diagnosis: Muscle weakness (generalized) (M62.81);Other abnormalities of gait and mobility (R26.89);Difficulty in walking, not elsewhere classified (R26.2);Dizziness and giddiness (R42)     Time: 1216-2446 PT Time Calculation (min) (ACUTE ONLY): 23 min  Charges:  $Gait Training: 8-22 mins $Therapeutic Exercise: 8-22 mins                     3:33 PM, 05/19/18 Etta Grandchild, PT, DPT Physical Therapist - [facility]  [phone] ([place])      [place] C 05/19/2018, 3:30 PM

## 2018-05-20 LAB — CBC WITH DIFFERENTIAL/PLATELET
Abs Immature Granulocytes: 0.05 10*3/uL (ref 0.00–0.07)
Basophils Absolute: 0 10*3/uL (ref 0.0–0.1)
Basophils Relative: 0 %
Eosinophils Absolute: 0.4 10*3/uL (ref 0.0–0.5)
Eosinophils Relative: 5 %
HCT: 25.6 % — ABNORMAL LOW (ref 39.0–52.0)
Hemoglobin: 8 g/dL — ABNORMAL LOW (ref 13.0–17.0)
Immature Granulocytes: 1 %
Lymphocytes Relative: 16 %
Lymphs Abs: 1.3 10*3/uL (ref 0.7–4.0)
MCH: 29.5 pg (ref 26.0–34.0)
MCHC: 31.3 g/dL (ref 30.0–36.0)
MCV: 94.5 fL (ref 80.0–100.0)
Monocytes Absolute: 0.9 10*3/uL (ref 0.1–1.0)
Monocytes Relative: 12 %
Neutro Abs: 5.2 10*3/uL (ref 1.7–7.7)
Neutrophils Relative %: 66 %
Platelets: 455 10*3/uL — ABNORMAL HIGH (ref 150–400)
RBC: 2.71 MIL/uL — ABNORMAL LOW (ref 4.22–5.81)
RDW: 15 % (ref 11.5–15.5)
WBC: 7.9 10*3/uL (ref 4.0–10.5)
nRBC: 0 % (ref 0.0–0.2)

## 2018-05-20 LAB — BASIC METABOLIC PANEL
Anion gap: 5 (ref 5–15)
BUN: 20 mg/dL (ref 8–23)
CO2: 24 mmol/L (ref 22–32)
Calcium: 8 mg/dL — ABNORMAL LOW (ref 8.9–10.3)
Chloride: 113 mmol/L — ABNORMAL HIGH (ref 98–111)
Creatinine, Ser: 1.9 mg/dL — ABNORMAL HIGH (ref 0.61–1.24)
GFR calc Af Amer: 37 mL/min — ABNORMAL LOW (ref >60)
GFR calc non Af Amer: 32 mL/min — ABNORMAL LOW (ref >60)
Glucose, Bld: 88 mg/dL (ref 70–99)
Potassium: 3.6 mmol/L (ref 3.5–5.1)
Sodium: 142 mmol/L (ref 135–145)

## 2018-05-20 MED ORDER — TRAZODONE HCL 50 MG PO TABS
50.0000 mg | ORAL_TABLET | Freq: Every evening | ORAL | Status: DC | PRN
  Administered 2018-05-20 – 2018-05-25 (×5): 50 mg via ORAL
  Filled 2018-05-20 (×6): qty 1

## 2018-05-20 NOTE — Progress Notes (Signed)
Nutrition Follow-up  RD working remotely.  DOCUMENTATION CODES:   Not applicable  INTERVENTION:  Please document meal completion for patient in chart. Food service host/hostess will not document since this patient is on enteric precautions.  Continue Ensure Enlive po TID, each supplement provides 350 kcal and 20 grams of protein.  Also provide Magic cup TID with meals, each supplement provides 290 kcal and 9 grams of protein. Patient prefers vanilla.  If patient at least drinks/eats all of these supplements, he will be meeting 100% calorie and protein needs since it is difficult to tell what else he is eating from his trays without documentation.  NUTRITION DIAGNOSIS:   Predicted suboptimal nutrient intake related to (hx EtOH abuse, concern for self-neglect) as evidenced by (per chart, slow weight loss from UBW of 150 lbs since 2016).  Resolving.  GOAL:   Patient will meet greater than or equal to 90% of their needs  Progressing.  MONITOR:   Labs, Weight trends, I & O's, Skin, Other (Comment)(TPN)  REASON FOR ASSESSMENT:   Consult New TPN/TNA  ASSESSMENT:   84 year old male with PMHx of HTN, OA, tobacco abuse, EtOH abuse admitted with GI bleed secondary to oozing duodenal ulcer s/p bipolar cautery on 4/26, C Diff colitis, hemorrhagic shock.  Spoke with patient over the phone. He reports his appetite is "okay." He is tolerating his diet. He reports for breakfast this morning he had bacon and grits but he cannot remember how much he ate. He cannot even remember what he ordered for dinner yesterday, let alone how much he ate. There is no meal documentation completed in chart. He does report he is drinking all 3 bottles of Ensure per day and enjoys them. He is amenable to trying vanilla [employer].  Medications reviewed and include: carvedilol, nicotine patch, pantoprazole.  Labs reviewed: Chloride 113, Creatinine 1.9.  Diet Order:   Diet Order            Diet regular  Room service appropriate? Yes; Fluid consistency: Thin  Diet effective now              EDUCATION NEEDS:   Not appropriate for education at this time  Skin:  Skin Assessment: Skin Integrity Issues:(MSAD to groin; ecchymosis; closed incision to abdomen)  Last BM:  05/16/2018 - medium type 6  Height:   Ht Readings from Last 1 Encounters:  05/03/18 5\' 5"  (1.651 m)   Weight:   Wt Readings from Last 1 Encounters:  05/16/18 61.2 kg   Ideal Body Weight:  61.8 kg  BMI:  Body mass index is 22.45 kg/m.  Estimated Nutritional Needs:   Kcal:  1700-1900kcal/day   Protein:  85-95g/day   Fluid:  1.5-1.7 L/day  Willey Blade, MS, RD, LDN Office: [phone] Pager: [phone] After Hours/Weekend Pager: [phone]

## 2018-05-20 NOTE — Care Management Important Message (Signed)
Important Message  Patient Details  Name: Justin Andrews MRN: [id number] Date of Birth: 05-14-1934   Medicare Important Message Given:  Yes    Su Hilt, RN 05/20/2018, 11:23 AM

## 2018-05-20 NOTE — TOC Progression Note (Signed)
Transition of Care [facility]) - Progression Note    Patient Details  Name: CHRISTIAAN STREBECK MRN: [id number] Date of Birth: April 28, 1934  Transition of Care [facility]) CM/SW [place], [place] Phone Number: 05/20/2018, 11:49 AM  Clinical Narrative:   CSW spoke with Peggye Form from [place] regarding patient. Per Margreta Journey she is in contact with some family care homes that could potentially accept patient. CSW explained that currently patient does not want to go to a family care home or to SNF. He would like to go live with his son or go live in a hotel. Son is not agreeable to taking patient home with him and wants him placed. CSW explained to son that patient can make his own decisions and is able to leave if he would like. DSS states that they will continue to seek family care home placement for patient. TOC team will continue to follow for discharge planning.     Expected Discharge Plan: Home/Self Care Barriers to Discharge: Continued Medical Work up  Expected Discharge Plan and Services Expected Discharge Plan: Home/Self Care   Discharge Planning Services: CM Consult   Living arrangements for the past 2 months: Single Family Home                 DME Arranged: (declines)         HH Arranged: (refuses)           Social Determinants of Health (SDOH) Interventions    Readmission Risk Interventions Readmission Risk Prevention Plan 05/07/2018  Transportation Screening Complete  PCP or Specialist Appt within 3-5 Days Complete  HRI or Home Care Consult Complete  Medication Review (RN Care Manager) Complete  Some recent data might be hidden

## 2018-05-20 NOTE — Progress Notes (Signed)
[place] [facility] [place] NAME: Aarion Metzgar    MR#:  [id number]  DATE OF BIRTH:  [birth year]  SUBJECTIVE:   Patient without complaint, no events overnight, case discussed with the patient's son-the patient does not have a place to go, awaiting SNF REVIEW OF SYSTEMS:  Review of Systems  Constitutional: Negative for chills and fever.  HENT: Negative for ear discharge and tinnitus.   Eyes: Negative for blurred vision and double vision.  Respiratory: Negative for shortness of breath.   Cardiovascular: Negative for chest pain and palpitations.  Gastrointestinal: Negative for diarrhea and heartburn.  Genitourinary: Negative for dysuria and urgency.  Musculoskeletal: Negative for myalgias and neck pain.  Skin: Negative for itching and rash.  Neurological: Negative for dizziness and headaches.  Psychiatric/Behavioral: Negative for depression and hallucinations.    DRUG ALLERGIES:   Allergies  Allergen Reactions  . Aspirin Other (See Comments)    bleeding   VITALS:  Blood pressure 118/64, pulse 82, temperature 98.5 F (36.9 C), temperature source Oral, resp. rate 19, height 5\' 5"  (1.651 m), weight 61.2 kg, SpO2 94 %. PHYSICAL EXAMINATION:    Physical Exam  Constitutional: He is oriented to person, place, and time. He appears well-developed and well-nourished.  HENT:  Head: Normocephalic and atraumatic.  Eyes: Pupils are equal, round, and reactive to light. Conjunctivae and EOM are normal. No scleral icterus.  Neck: Normal range of motion. Neck supple. No tracheal deviation present.  Cardiovascular: Normal rate, regular rhythm and normal heart sounds.  Respiratory: Effort normal. He has rales.  GI: Soft. Bowel sounds are normal.  PEG tube replaced.  In place.  Site of recent abdominal surgery well opposed with no signs of infection.  Musculoskeletal: Normal range of motion.        General: No edema.  Neurological: He is alert and oriented  to person, place, and time.  Skin: Skin is warm. He is not diaphoretic. No erythema.  Psychiatric: He has a normal mood and affect. His behavior is normal.   LABORATORY PANEL:  Male CBC Recent Labs  Lab 05/20/18 0433  WBC 7.9  HGB 8.0*  HCT 25.6*  PLT 455*   ------------------------------------------------------------------------------------------------------------------ Chemistries  Recent Labs  Lab 05/14/18 0511  05/18/18 0423  05/20/18 0433  NA 140   < > 144   < > 142  K 4.2   < > 3.9   < > 3.6  CL 112*   < > 116*   < > 113*  CO2 22   < > 21*   < > 24  GLUCOSE 143*   < > 107*   < > 88  BUN 30*   < > 31*   < > 20  CREATININE 1.45*   < > 1.94*   < > 1.90*  CALCIUM 7.7*   < > 8.1*   < > 8.0*  MG 1.9   < > 2.2  --   --   AST 16  --   --   --   --   ALT 13  --   --   --   --   ALKPHOS 86  --   --   --   --   BILITOT 0.4  --   --   --   --    < > = values in this interval not displayed.   RADIOLOGY:  No results found. ASSESSMENT AND PLAN:  *Acute Hemorrhagic shock  Resolved Secondary  to acute blood loss anemia from GI bleed S/P Exp laparotomy for bleeding duodenal ulcer s/p duodenal plasty/jejunostomy by general surgery, s/p 4 units PRBC/FFP, tolerating regular diet, NM bleeding scan had shown some active bleed in the rectal area. EGD shown some bleed from duodenal area - s/p cautery  *AKI w/ CKD stage III Largely resolved with IV fluids for rehydration Bl Cr 1.5 -1.6  Continue to avoid nephrotoxic agents   *Chronic hypertension  Stable on current regiment   *History of alcoholism  Stable  Alcohol withdrawal protocol while in house   *Acute diarrhea  Resolved due to norovirus. Patient had history of C. difficile in recent past.  * Pneumonia, possible HAP or aspiration pneumonia Treated with course of antibiotics while in house  *Acute respiratory failure with hypoxia due to pneumonia Resolved Successfully weaned off oxygen  DVT prophylaxis; SCDs No  heparin products due to anemia requiring multiple units of packed red blood cell transfusions OT recommending SNF, SW assisting with disposition planning, DSS following Case was discussed with the patient's son with all questions answered  All the records are reviewed and case discussed with Care Management/Social Worker. Management plans discussed with the patient, he is in agreement.  CODE STATUS: DNR  TOTAL TIME TAKING CARE OF THIS PATIENT: 49minutes.   More than 50% of the time was spent in counseling/coordination of care: YES  POSSIBLE D/C IN 1 DAYS, DEPENDING ON CLINICAL CONDITION.   Anticipate discharge to group home when medically stable.   Avel Peace Salary M.D on 05/20/2018 at 11:42 AM  Between 7am to 6pm - Pager - [phone]  After 6pm go to [email or web address] - Technical brewer [facility]  Office  [phone]  CC: Primary care physician; Leone Haven, MD  Note: This dictation was prepared with Dragon dictation along with smaller phrase technology. Any transcriptional errors that result from this process are unintentional.

## 2018-05-20 NOTE — Progress Notes (Signed)
Took over pt care at 1515, pt alert, working with PT, voices needs

## 2018-05-20 NOTE — Progress Notes (Signed)
Physical Therapy Treatment Patient Details Name: FARREN NELLES MRN: [id number] DOB: 03-21-1934 Today's Date: 05/20/2018    History of Present Illness Huxley Bunn is an 84 yo male who comes to [facility] 4/25, found on the floor by neighbors minimally responsive in his stool/blood. Upon arrival, hypothermic, hypotensive on arrival and tachypneic, Hb: 3s. Per son, pt was advised to go to hospital by homehealth RN, but pt did not. Later afte rmore dark stools family called EMS, but pt refused to go to ED. PMH: C-diff, CKD, HTN, ETOH abuse, cigarrette use, Lt hip fracture Nov 2019 s/p ORIF and STR stay, recent fall with Right hip pain. Since admission pt is s/p multiple units PRBC and vascular procedure to address GI bleed.  Pt went back to OR 5/1 for ex-lab to address continued GIB.     PT Comments    Pt in bed upon arrival, agreeable to PT treatment but asks for pain meds first. Pt progresses AMB to 278f, but BLE remain easily fatigued. Pt appears to have improved gait stability this date. Pt performs STS 10x from chair with RW, but when asked to perform hands free, patient is unable, even from an elevated surface, needs some hand assistance due to weakness in the legs. Pt is oriented, but continues to demonstrate some low level impulsivity, decreased safety awareness, and inappropriate responses to situation. At one point, he requests that PT AMB him into hallway so he can retrieve a secret cache of cigarettes that only he knows about. After exit, author responded to chair alarm <10 minutes later, pt standing up in room, trying to manually force the foot of the chair back down with both hands, attempting AMB without RW back to bed. Pt assisted back to bed as desired, made comfortable, all needs met, bed alarm turned on. RN made aware.     Follow Up Recommendations  Supervision/Assistance - 24 hour;SNF;Supervision for mobility/OOB     Equipment Recommendations  None recommended by PT    Recommendations  for Other Services       Precautions / Restrictions Precautions Precautions: Fall Precaution Comments: mildly confused but I suspect close to baseline; generally pleasant and following commands; poor safety awareness Restrictions Weight Bearing Restrictions: No    Mobility  Bed Mobility Overal bed mobility: Modified Independent Bed Mobility: Rolling           General bed mobility comments: asked to demonstrate log roll as shown yesterday, which he reproduced with a fair amount of acuracy.   Transfers Overall transfer level: Needs assistance Equipment used: Rolling walker (2 wheeled);None Transfers: Sit to/from Stand Sit to Stand: Supervision         General transfer comment: Performs 10x c RW, but when cued unable to do hands-free; ptable to perform with less BUE assist from elevated surface.   Ambulation/Gait Ambulation/Gait assistance: Min guard Gait Distance (Feet): 200 Feet Assistive device: Rolling walker (2 wheeled) Gait Pattern/deviations: Step-to pattern     General Gait Details: less instability with AMB this date.    Stairs             Wheelchair Mobility    Modified Rankin (Stroke Patients Only)       Balance Overall balance assessment: Needs assistance Sitting-balance support: No upper extremity supported;Feet supported       Standing balance support: During functional activity;Single extremity supported Standing balance-Leahy Scale: Fair Standing balance comment: some LOB with RW during turns and straight plane AMB  Cognition Arousal/Alertness: Awake/alert Behavior During Therapy: WFL for tasks assessed/performed Overall Cognitive Status: Within Functional Limits for tasks assessed                                 General Comments: still has mild difficulty with orientation to time; short term/long term memory are intact, but has impulsivity, poor safety awareness, confusion at  times (asking me to take him across the street to a 'pack of cigarettes' he left there 3 days ago.       Exercises      General Comments        Pertinent Vitals/Pain Pain Assessment: Faces Faces Pain Scale: Hurts little more Pain Location: ABD incision  Pain Descriptors / Indicators: Aching;Discomfort;Grimacing;Constant Pain Intervention(s): Limited activity within patient's tolerance;Monitored during session;Premedicated before session;RN gave pain meds during session    Home Living                      Prior Function            PT Goals (current goals can now be found in the care plan section) Acute Rehab PT Goals Patient Stated Goal: regain strength upon return to home to maintain independence  PT Goal Formulation: With patient Time For Goal Achievement: 05/25/18 Potential to Achieve Goals: Good Progress towards PT goals: Progressing toward goals    Frequency    Min 2X/week      PT Plan Current plan remains appropriate    Co-evaluation              AM-PAC PT "6 Clicks" Mobility   Outcome Measure  Help needed turning from your back to your side while in a flat bed without using bedrails?: A Little Help needed moving from lying on your back to sitting on the side of a flat bed without using bedrails?: A Little Help needed moving to and from a bed to a chair (including a wheelchair)?: A Little Help needed standing up from a chair using your arms (e.g., wheelchair or bedside chair)?: None Help needed to walk in hospital room?: A Little Help needed climbing 3-5 steps with a railing? : A Lot 6 Click Score: 18    End of Session Equipment Utilized During Treatment: Gait belt Activity Tolerance: Patient tolerated treatment well Patient left: with call bell/phone within reach;in bed;with bed alarm set Nurse Communication: Mobility status PT Visit Diagnosis: Muscle weakness (generalized) (M62.81);Other abnormalities of gait and mobility  (R26.89);Difficulty in walking, not elsewhere classified (R26.2);Dizziness and giddiness (R42)     Time: 7703-4035 PT Time Calculation (min) (ACUTE ONLY): 25 min  Charges:  $Gait Training: 8-22 mins $Therapeutic Exercise: 8-22 mins                     3:59 PM, 05/20/18 Etta Grandchild, PT, DPT Physical Therapist - [facility]  [phone] ([place])     Bradford C 05/20/2018, 3:51 PM

## 2018-05-20 NOTE — Progress Notes (Signed)
[facility] Day(s): 18.   Post op day(s): 12 Days Post-Op.   Interval History: Patient seen and examined, no acute events or new complaints overnight. Patient reports that he is feeling good this morning. He does have some pain near his incision and drain site. No fevers, chills, nausea, or emesis. He continues to tolerate a diet without issue. Continues having BM without blood. Working with PT recommending SNF.   Review of Systems:  Constitutional: denies fever, chills  Respiratory: denies any shortness of breath  Cardiovascular: denies chest pain or palpitations  Gastrointestinal: + abdominal pain (incisional), denied N/V, or diarrhea/and bowel function as per interval history Integumentary: + Midline incision   Vital signs in last 24 hours: [min-max] current  Temp:  [98.4 F (36.9 C)-98.6 F (37 C)] 98.5 F (36.9 C) (05/13 0354) Pulse Rate:  [82-89] 82 (05/13 0354) Resp:  [17-19] 19 (05/13 0354) BP: (113-136)/(51-68) 118/64 (05/13 0354) SpO2:  [94 %-98 %] 94 % (05/13 0354)     Height: 5\' 5"  (165.1 cm) Weight: (uable to obtain ) BMI (Calculated): 22.45   Intake/Output this shift:  No intake/output data recorded.     Physical Exam:  Constitutional: alert, cooperative and no distress  Respiratory: breathing non-labored at rest  Cardiovascular: regular rate and sinus rhythm  Gastrointestinal: soft, non-tender, and non-distended. Red rubber J-tube in left abdomen. JP in right abdomen with serous output.  Integumentary: Staple removed from midline incision, healing well. No erythema.    Labs:  CBC Latest Ref Rng & Units 05/20/2018 05/19/2018 05/18/2018  WBC 4.0 - 10.5 K/uL 7.9 8.2 7.6  Hemoglobin 13.0 - 17.0 g/dL 8.0(L) 8.1(L) 8.6(L)  Hematocrit 39.0 - 52.0 % 25.6(L) 25.9(L) 27.8(L)  Platelets 150 - 400 K/uL 455(H) 464(H) 455(H)   CMP Latest Ref Rng & Units 05/20/2018 05/19/2018 05/18/2018  Glucose 70 - 99 mg/dL 88 84 107(H)   BUN 8 - 23 mg/dL 20 25(H) 31(H)  Creatinine 0.61 - 1.24 mg/dL 1.90(H) 1.93(H) 1.94(H)  Sodium 135 - 145 mmol/L 142 142 144  Potassium 3.5 - 5.1 mmol/L 3.6 3.5 3.9  Chloride 98 - 111 mmol/L 113(H) 112(H) 116(H)  CO2 22 - 32 mmol/L 24 23 21(L)  Calcium 8.9 - 10.3 mg/dL 8.0(L) 8.0(L) 8.1(L)  Total Protein 6.5 - 8.1 g/dL - - -  Total Bilirubin 0.3 - 1.2 mg/dL - - -  Alkaline Phos 38 - 126 U/L - - -  AST 15 - 41 U/L - - -  ALT 0 - 44 U/L - - -     Imaging studies: No new pertinent imaging studies   Assessment/Plan:  84 y.o. male with stable, but low, hemoglobin (8.0) this morning without any evidence of active bleeding with stable renal function otherwise overall doing well 12 Days Post-Op s/p exploratory Laparotomy, EDG, duodenal stricture biopsy, 3 vessel ligation of duodenal bleeding ulcer, duodenoplasty, feeding jejunostomyfor bleeding duodenal ulcer.   - Regular diet + nutritional supplementation; dietitian following    - Pain control prn             - Monitor hgb; no signs of re-bleeding             - Continue JP drain    - continue J-tube; will need to remain in for 6-8 weeks post-op   - Medical management per primary team  - Worked with PT; Recommending SNF; awaiting placement    - Discharge planning: Stable for discharge from surgical standpoint; awaiting placement;  will need to continue JP and J-Tube at discharge  All of the above findings and recommendations were discussed with the patient, and the medical team, and all of patient's questions were answered to his expressed satisfaction.  -- Edison Simon, PA-C [facility] 05/20/2018, 7:22 AM [phone] M-F: 7am - 4pm

## 2018-05-21 LAB — CBC WITH DIFFERENTIAL/PLATELET
Abs Immature Granulocytes: 0.04 10*3/uL (ref 0.00–0.07)
Basophils Absolute: 0 10*3/uL (ref 0.0–0.1)
Basophils Relative: 0 %
Eosinophils Absolute: 0.3 10*3/uL (ref 0.0–0.5)
Eosinophils Relative: 5 %
HCT: 26.7 % — ABNORMAL LOW (ref 39.0–52.0)
Hemoglobin: 8.5 g/dL — ABNORMAL LOW (ref 13.0–17.0)
Immature Granulocytes: 1 %
Lymphocytes Relative: 19 %
Lymphs Abs: 1.3 10*3/uL (ref 0.7–4.0)
MCH: 29.6 pg (ref 26.0–34.0)
MCHC: 31.8 g/dL (ref 30.0–36.0)
MCV: 93 fL (ref 80.0–100.0)
Monocytes Absolute: 0.9 10*3/uL (ref 0.1–1.0)
Monocytes Relative: 13 %
Neutro Abs: 4.3 10*3/uL (ref 1.7–7.7)
Neutrophils Relative %: 62 %
Platelets: 449 10*3/uL — ABNORMAL HIGH (ref 150–400)
RBC: 2.87 MIL/uL — ABNORMAL LOW (ref 4.22–5.81)
RDW: 14.7 % (ref 11.5–15.5)
WBC: 6.8 10*3/uL (ref 4.0–10.5)
nRBC: 0 % (ref 0.0–0.2)

## 2018-05-21 LAB — BASIC METABOLIC PANEL
Anion gap: 6 (ref 5–15)
BUN: 18 mg/dL (ref 8–23)
CO2: 23 mmol/L (ref 22–32)
Calcium: 8 mg/dL — ABNORMAL LOW (ref 8.9–10.3)
Chloride: 112 mmol/L — ABNORMAL HIGH (ref 98–111)
Creatinine, Ser: 1.79 mg/dL — ABNORMAL HIGH (ref 0.61–1.24)
GFR calc Af Amer: 40 mL/min — ABNORMAL LOW (ref >60)
GFR calc non Af Amer: 35 mL/min — ABNORMAL LOW (ref >60)
Glucose, Bld: 90 mg/dL (ref 70–99)
Potassium: 3.8 mmol/L (ref 3.5–5.1)
Sodium: 141 mmol/L (ref 135–145)

## 2018-05-21 NOTE — Progress Notes (Signed)
PT Cancellation Note  Patient Details Name: Justin Andrews MRN: [id number] DOB: February 06, 1934   Cancelled Treatment:    Reason Eval/Treat Not Completed: Patient declined, no reason specified(patient initial seemed agreeable to participate in PT; pt reported 7/10 pain at abdomen and requested pain medication; nursing was consulted for pain medication and stated it had been recently administered and would be a little while before pt could have more, patient accepted this information; once clinician gowned up and prepared the room for mobility training, pt refused to participate multiple times, stating "once I get comfortable no on will move me because it will trigger the pain." Attempted to educate patient about importance of mobility training. Pt argumentative and emphatic he would not participate. Not receptive to education provided. Will try again at a later time/date )   Everlean Alstrom. Graylon Good, PT, DPT 05/21/18, 3:25 PM

## 2018-05-21 NOTE — Progress Notes (Signed)
Brief Progress Note Patient chart reviewed this morning. Continues to tolerate diet and hemoglobin improved without evidence of bleeding.   Awaiting placement. No surgical needs identified. General surgery will sign off  Patient should follow up with Dr Hampton Abbot 1 week after discharge. Will need to go home with JP drain and drain teaching.   Discussed above with Dr Dahlia Byes.   -- Edison Simon, PA-C [facility] 05/21/2018, 11:01 AM [phone] M-F: 7am - 4pm

## 2018-05-21 NOTE — Progress Notes (Signed)
[facility] at [place] NAME: Nivan Melendrez    MR#:  [id number]  DATE OF BIRTH:  [birth year]  SUBJECTIVE:   Patient without complaint, no events overnight, case discussed with the patient's son-the patient does not have a place to go, awaiting SNF.  REVIEW OF SYSTEMS:  Review of Systems  Constitutional: Negative for chills and fever.  HENT: Negative for ear discharge and tinnitus.   Eyes: Negative for blurred vision and double vision.  Respiratory: Negative for shortness of breath.   Cardiovascular: Negative for chest pain and palpitations.  Gastrointestinal: Negative for diarrhea and heartburn.  Genitourinary: Negative for dysuria and urgency.  Musculoskeletal: Negative for myalgias and neck pain.  Skin: Negative for itching and rash.  Neurological: Negative for dizziness and headaches.  Psychiatric/Behavioral: Negative for depression and hallucinations.    DRUG ALLERGIES:   Allergies  Allergen Reactions  . Aspirin Other (See Comments)    bleeding   VITALS:  Blood pressure (!) 109/52, pulse 83, temperature 98.7 F (37.1 C), temperature source Oral, resp. rate 18, height 5\' 5"  (1.651 m), weight 61.2 kg, SpO2 96 %. PHYSICAL EXAMINATION:    Physical Exam  Constitutional: He is oriented to person, place, and time. He appears well-developed and well-nourished.  HENT:  Head: Normocephalic and atraumatic.  Eyes: Pupils are equal, round, and reactive to light. Conjunctivae and EOM are normal. No scleral icterus.  Neck: Normal range of motion. Neck supple. No tracheal deviation present.  Cardiovascular: Normal rate, regular rhythm and normal heart sounds.  Respiratory: Effort normal. He has rales.  GI: Soft. Bowel sounds are normal.  PEG tube replaced.  In place.  Site of recent abdominal surgery well opposed with no signs of infection.  Musculoskeletal: Normal range of motion.        General: No edema.  Neurological: He is alert and  oriented to person, place, and time.  Skin: Skin is warm. He is not diaphoretic. No erythema.  Psychiatric: He has a normal mood and affect. His behavior is normal.   LABORATORY PANEL:  Male CBC Recent Labs  Lab 05/21/18 0427  WBC 6.8  HGB 8.5*  HCT 26.7*  PLT 449*   ------------------------------------------------------------------------------------------------------------------ Chemistries  Recent Labs  Lab 05/18/18 0423  05/21/18 0427  NA 144   < > 141  K 3.9   < > 3.8  CL 116*   < > 112*  CO2 21*   < > 23  GLUCOSE 107*   < > 90  BUN 31*   < > 18  CREATININE 1.94*   < > 1.79*  CALCIUM 8.1*   < > 8.0*  MG 2.2  --   --    < > = values in this interval not displayed.   RADIOLOGY:  No results found. ASSESSMENT AND PLAN:  *Acute Hemorrhagic shock  Resolved Secondary to acute blood loss anemia from GI bleed S/P Exp laparotomy for bleeding duodenal ulcer s/p duodenal plasty/ jejunostomy by general surgery, s/p 4 units PRBC/FFP, tolerating regular diet, NM bleeding scan had shown some active bleed in the rectal area. EGD shown some bleed from duodenal area - s/p cautery  *AKI w/ CKD stage III Largely resolved with IV fluids for rehydration Bl Cr 1.5 -1.6  Continue to avoid nephrotoxic agents   *Chronic hypertension  Stable on current regiment   *History of alcoholism  Stable  Alcohol withdrawal protocol while in house   *Acute diarrhea  Resolved due to norovirus.  Patient had history of C. difficile in recent past.  * Pneumonia, possible HAP or aspiration pneumonia Treated with course of antibiotics while in house  *Acute respiratory failure with hypoxia due to pneumonia Resolved Successfully weaned off oxygen  DVT prophylaxis; SCDs No heparin products due to anemia requiring multiple units of packed red blood cell transfusions OT recommending SNF, SW assisting with disposition planning, DSS following Case was discussed with the patient's son with all  questions answered  All the records are reviewed and case discussed with Care Management/Social Worker. Management plans discussed with the patient, he is in agreement.  CODE STATUS: DNR  TOTAL TIME TAKING CARE OF THIS PATIENT: 84minutes.   More than 50% of the time was spent in counseling/coordination of care: YES  POSSIBLE D/C IN 1-2 DAYS, DEPENDING ON CLINICAL CONDITION.   Anticipate discharge to facility once approved.   Vaughan Basta M.D on 05/21/2018 at 12:31 PM  Between 7am to 6pm - Pager - [phone]  After 6pm go to [email or web address] - Technical brewer [facility]  Office  [phone]  CC: Primary care physician; Leone Haven, MD  Note: This dictation was prepared with Dragon dictation along with smaller phrase technology. Any transcriptional errors that result from this process are unintentional.

## 2018-05-22 NOTE — Progress Notes (Signed)
PT Cancellation Note  Patient Details Name: Justin Andrews MRN: [id number] DOB: [birth year]   Cancelled Treatment:    Reason Eval/Treat Not Completed: Patient declined, no reason specified( )  Patient sleeping in room upon therapist entry. Patient's lunch tray beside bed, appears untouched. PT asked patient if he would like help sitting up to eat his lunch. Patient reports, "I don't want anything but some pain medicine. Leave me alone." Patient reported 7/10 abdominal pain requesting pain meds if able. RN notified. Will re-attempt PT treatment when patient is willing to participate. Thank you for this referral.  Cope Marte PT, DPT 05/22/2018, 1:54 PM

## 2018-05-22 NOTE — Progress Notes (Signed)
[facility] at [place] NAME: Kavari Parrillo    MR#:  [id number]  DATE OF BIRTH:  18-Mar-1934  SUBJECTIVE:   Patient without complaint, no events overnight, case discussed with the patient's son-the patient does not have a place to go, awaiting SNF/ group home placement by DSS.  REVIEW OF SYSTEMS:  Review of Systems  Constitutional: Negative for chills and fever.  HENT: Negative for ear discharge and tinnitus.   Eyes: Negative for blurred vision and double vision.  Respiratory: Negative for shortness of breath.   Cardiovascular: Negative for chest pain and palpitations.  Gastrointestinal: Negative for diarrhea and heartburn.  Genitourinary: Negative for dysuria and urgency.  Musculoskeletal: Negative for myalgias and neck pain.  Skin: Negative for itching and rash.  Neurological: Negative for dizziness and headaches.  Psychiatric/Behavioral: Negative for depression and hallucinations.    DRUG ALLERGIES:   Allergies  Allergen Reactions  . Aspirin Other (See Comments)    bleeding   VITALS:  Blood pressure 125/69, pulse 65, temperature 98.4 F (36.9 C), temperature source Oral, resp. rate 17, height 5\' 5"  (1.651 m), weight 61.2 kg, SpO2 95 %. PHYSICAL EXAMINATION:    Physical Exam  Constitutional: He is oriented to person, place, and time. He appears well-developed and well-nourished.  HENT:  Head: Normocephalic and atraumatic.  Eyes: Pupils are equal, round, and reactive to light. Conjunctivae and EOM are normal. No scleral icterus.  Neck: Normal range of motion. Neck supple. No tracheal deviation present.  Cardiovascular: Normal rate, regular rhythm and normal heart sounds.  Respiratory: Effort normal. He has rales.  GI: Soft. Bowel sounds are normal.  PEG tube replaced. JP drain in place..  Musculoskeletal: Normal range of motion.        General: No edema.  Neurological: He is alert and oriented to person, place, and time.  Skin:  Skin is warm. He is not diaphoretic. No erythema.  Psychiatric: He has a normal mood and affect. His behavior is normal.   LABORATORY PANEL:  Male CBC Recent Labs  Lab 05/21/18 0427  WBC 6.8  HGB 8.5*  HCT 26.7*  PLT 449*   ------------------------------------------------------------------------------------------------------------------ Chemistries  Recent Labs  Lab 05/18/18 0423  05/21/18 0427  NA 144   < > 141  K 3.9   < > 3.8  CL 116*   < > 112*  CO2 21*   < > 23  GLUCOSE 107*   < > 90  BUN 31*   < > 18  CREATININE 1.94*   < > 1.79*  CALCIUM 8.1*   < > 8.0*  MG 2.2  --   --    < > = values in this interval not displayed.   RADIOLOGY:  No results found. ASSESSMENT AND PLAN:  *Acute Hemorrhagic shock  Resolved Secondary to acute blood loss anemia from GI bleed S/P Exp laparotomy for bleeding duodenal ulcer s/p duodenal plasty/ jejunostomy by general surgery, s/p 4 units PRBC/FFP, tolerating regular diet, NM bleeding scan had shown some active bleed in the rectal area. EGD shown some bleed from duodenal area - s/p cautery  *AKI w/ CKD stage III Largely resolved with IV fluids for rehydration Bl Cr 1.5 -1.6  Continue to avoid nephrotoxic agents   *Chronic hypertension  Stable on current regiment   *History of alcoholism  Stable  Alcohol withdrawal protocol while in house   *Acute diarrhea  Resolved due to norovirus. Patient had history of C. difficile in recent  past.  * Pneumonia, possible HAP or aspiration pneumonia Treated with course of antibiotics while in house  *Acute respiratory failure with hypoxia due to pneumonia Resolved Successfully weaned off oxygen  DVT prophylaxis; SCDs No heparin products due to anemia requiring multiple units of packed red blood cell transfusions OT recommending SNF, SW assisting with disposition planning, DSS following Case was discussed with the patient's son with all questions answered  All the records are  reviewed and case discussed with Care Management/Social Worker. Management plans discussed with the patient, he is in agreement.  CODE STATUS: DNR  TOTAL TIME TAKING CARE OF THIS PATIENT: 42minutes.   More than 50% of the time was spent in counseling/coordination of care: YES  POSSIBLE D/C IN 1-2 DAYS, DEPENDING ON CLINICAL CONDITION.   Anticipate discharge to facility once approved.   Vaughan Basta M.D on 05/22/2018 at 2:55 PM  Between 7am to 6pm - Pager - [phone]  After 6pm go to [email or web address] - Technical brewer  [facility]  Office  [phone]  CC: Primary care physician; Leone Haven, MD  Note: This dictation was prepared with Dragon dictation along with smaller phrase technology. Any transcriptional errors that result from this process are unintentional.

## 2018-05-22 NOTE — Progress Notes (Signed)
OT Cancellation Note  Patient Details Name: Justin Andrews MRN: [id number] DOB: September 21, 1934   Cancelled Treatment:    Reason Eval/Treat Not Completed: Patient declined, no reason specified. Pt declining therapy this date 2/2 pain and fatigue. Will re-attempt next date.   Jeni Salles, MPH, MS, OTR/L ascom [phone] 05/22/18, 1:13 PM

## 2018-05-23 NOTE — Progress Notes (Signed)
Occupational Therapy Treatment Patient Details Name: Justin Andrews MRN: [id number] DOB: 1934/05/13 Today's Date: 05/23/2018    History of present illness Justin Andrews is an 84 yo male who comes [facility] 4/25, found on the floor by neighbors minimally responsive in his stool/blood. Upon arrival, hypothermic, hypotensive on arrival and tachypneic, Hb: 3s. Per son, pt was advised to go to hospital by homehealth RN, but pt did not. Later afte rmore dark stools family called EMS, but pt refused to go to ED. PMH: C-diff, CKD, HTN, ETOH abuse, cigarrette use, Lt hip fracture Nov 2019 s/p ORIF and STR stay, recent fall with Right hip pain. Since admission pt is s/p multiple units PRBC and vascular procedure to address GI bleed.  Pt went back to OR 5/1 for ex-lab to address continued GIB.    OT comments  Pt seen for OT treatment on this date. Upon arrival to room pt awake/alert seated upright in bed with lunch tray. Pt endorsed some pain, but stated he was otherwise feeling very good on this date. Initially reluctant to participate in OT tx, but agreeable with encouragement. Pt assisted with locating items on his lunch tray. Pt noted to set items aside and not be able to locate them afterward. OT provided minimal set-up assist for small items like salt and pepper. Pt instructed on falls prevention strategies while in the hospital including always wearing hospital socks and using call bell when needing to ambulate in room. Per nsg pt has ambulated in room without assist multiple times. Pt also instructed in safe transfer techniques including using bed rail and slowing movements to ensure safety and a clear path. Some impulsivity noted with mobility on this date. Pt able to ambulate in room with supervision assist and no AD. Pt educated on importance of safe use of AD for falls prevention. Pt verbalized understanding of instruction provided. Pt making good progress toward goals and continues to benefit from skilled OT  services to maximize return to PLOF and minimize risk of future falls, injury, caregiver burden, and readmission. Will continue to follow POC. Discharge recommendation remains appropriate.    Follow Up Recommendations  SNF;Supervision/Assistance - 24 hour    Equipment Recommendations  None recommended by OT    Recommendations for Other Services      Precautions / Restrictions Precautions Precautions: Fall Precaution Comments: mildly confused but I suspect close to baseline; generally pleasant and following commands; poor safety awareness Restrictions Weight Bearing Restrictions: No       Mobility Bed Mobility Overal bed mobility: Modified Independent Bed Mobility: Supine to Sit Rolling: Modified independent (Device/Increase time)         General bed mobility comments: Pt quick to move to EOB on this date coming from semi-sup to sitting EOB. Required VCs to slow movements and move safely.   Transfers     Transfers: Sit to/from Stand Sit to Stand: Independent         General transfer comment: Pt able to stand from elevated bed independently. Somewhat impulsive with movements, and quick to move t/o session. OT provided supervision for safety and VC's otherwise pt was independent. Per RN, pt has been ambulating in room independently.     Balance Overall balance assessment: Needs assistance Sitting-balance support: No upper extremity supported;Feet supported Sitting balance-Leahy Scale: Good     Standing balance support: Single extremity supported;No upper extremity supported Standing balance-Leahy Scale: Good Standing balance comment: Pt demonstrated good balance during functional mobility. Declined use of walker. Was  able to move from EOB to room recliner without UE support.                            ADL either performed or assessed with clinical judgement   ADL Overall ADL's : Needs assistance/impaired   Eating/Feeding Details (indicate cue type and  reason): Pt back to solid diet. Able to eat sitting up in bed/recliner. Has difficulty attending to items on his tray. Unable to find salt packets, makes multiple requests for assistance. OT assisted with locating items on tray and minor set up. Pt able to self-feed independently.                                  Functional mobility during ADLs: Independent;Supervision/safety General ADL Comments: Pt able to ambulate to room recliner with supervision for safety. Overall ADL function appears significantly improved. Per RN, pt has also ambulated independently in room to use bathroom without assistance on multiple occasions.      Vision Patient Visual Report: No change from baseline     Perception     Praxis      Cognition Arousal/Alertness: Awake/alert Behavior During Therapy: WFL for tasks assessed/performed Overall Cognitive Status: Within Functional Limits for tasks assessed                                          Exercises Other Exercises Other Exercises: functional ADL t/f training; assisted with set-up of lunch tray as pt unable to locate small items including salt & pepper.    Shoulder Instructions       General Comments Pt in good spirits. Initially reluctant to participate in OT tx, but willing with encouragement from therapist.     Pertinent Vitals/ Pain       Faces Pain Scale: Hurts little more Pain Location: ABD incision  Pain Descriptors / Indicators: Aching;Discomfort;Grimacing;Constant Pain Intervention(s): Limited activity within patient's tolerance;Monitored during session;Premedicated before session  Home Living                                          Prior Functioning/Environment              Frequency  Min 2X/week        Progress Toward Goals  OT Goals(current goals can now be found in the care plan section)  Progress towards OT goals: Progressing toward goals  Acute Rehab OT Goals Patient  Stated Goal: regain strength upon return to home to maintain independence  OT Goal Formulation: With patient Time For Goal Achievement: 05/26/18 Potential to Achieve Goals: Good  Plan Frequency remains appropriate;Discharge plan remains appropriate    Co-evaluation                 AM-PAC OT "6 Clicks" Daily Activity     Outcome Measure   Help from another person eating meals?: A Little Help from another person taking care of personal grooming?: A Little Help from another person toileting, which includes using toliet, bedpan, or urinal?: A Little Help from another person bathing (including washing, rinsing, drying)?: A Lot Help from another person to put on and taking off regular upper body clothing?: A Little Help  from another person to put on and taking off regular lower body clothing?: A Lot 6 Click Score: 16    End of Session Equipment Utilized During Treatment: Gait belt  OT Visit Diagnosis: Other abnormalities of gait and mobility (R26.89);History of falling (Z91.81);Pain Pain - Right/Left: Right Pain - part of body: Leg   Activity Tolerance Patient tolerated treatment well   Patient Left in chair;with call bell/phone within reach;with chair alarm set   Nurse Communication Other (comment)(Pt up in chair. )        Time: 3614-4315 OT Time Calculation (min): 16 min  Charges: OT General Charges $OT Visit: 1 Visit OT Treatments $Self Care/Home Management : 8-22 mins  Shara Blazing, M.S., OTR/L Ascom: [phone] 05/23/18, 3:00 PM

## 2018-05-23 NOTE — Progress Notes (Signed)
[facility]in [facility] at [place] NAME: Justin Andrews    MR#:  [id number]  DATE OF BIRTH:  04/26/1934  SUBJECTIVE:   No acute events overnight.  Pt. Awaiting placement.    REVIEW OF SYSTEMS:    Review of Systems  Constitutional: Negative for chills and fever.  HENT: Negative for congestion and tinnitus.   Eyes: Negative for blurred vision and double vision.  Respiratory: Negative for cough, shortness of breath and wheezing.   Cardiovascular: Negative for chest pain, orthopnea and PND.  Gastrointestinal: Negative for abdominal pain, diarrhea, nausea and vomiting.  Genitourinary: Negative for dysuria and hematuria.  Neurological: Negative for dizziness, sensory change and focal weakness.  All other systems reviewed and are negative.   Nutrition: Regular  Tolerating Diet: Yes Tolerating PT: Eval noted.   DRUG ALLERGIES:   Allergies  Allergen Reactions  . Aspirin Other (See Comments)    bleeding    VITALS:  Blood pressure (!) 104/51, pulse 81, temperature 98.1 F (36.7 C), resp. rate 18, height 5\' 5"  (1.651 m), weight 61.2 kg, SpO2 96 %.  PHYSICAL EXAMINATION:   Physical Exam  GENERAL:  84 y.o.-year-old patient lying in bed in no acute distress.  EYES: Pupils equal, round, reactive to light and accommodation. No scleral icterus. Extraocular muscles intact.  HEENT: Head atraumatic, normocephalic. Oropharynx and nasopharynx clear.  NECK:  Supple, no jugular venous distention. No thyroid enlargement, no tenderness.  LUNGS: Normal breath sounds bilaterally, no wheezing, rales, rhonchi. No use of accessory muscles of respiration.  CARDIOVASCULAR: S1, S2 normal. No murmurs, rubs, or gallops.  ABDOMEN: Soft, nontender, nondistended. Bowel sounds present. No organomegaly or mass.  EXTREMITIES: No cyanosis, clubbing or edema b/l.    NEUROLOGIC: Cranial nerves II through XII are intact. No focal Motor or sensory deficits b/l. Globally weak.    PSYCHIATRIC: The patient is alert and oriented x 3.  SKIN: No obvious rash, lesion, or ulcer.    LABORATORY PANEL:   CBC Recent Labs  Lab 05/21/18 0427  WBC 6.8  HGB 8.5*  HCT 26.7*  PLT 449*   ------------------------------------------------------------------------------------------------------------------  Chemistries  Recent Labs  Lab 05/18/18 0423  05/21/18 0427  NA 144   < > 141  K 3.9   < > 3.8  CL 116*   < > 112*  CO2 21*   < > 23  GLUCOSE 107*   < > 90  BUN 31*   < > 18  CREATININE 1.94*   < > 1.79*  CALCIUM 8.1*   < > 8.0*  MG 2.2  --   --    < > = values in this interval not displayed.   ------------------------------------------------------------------------------------------------------------------  Cardiac Enzymes No results for input(s): TROPONINI in the last 168 hours. ------------------------------------------------------------------------------------------------------------------  RADIOLOGY:  No results found.   ASSESSMENT AND PLAN:   * Acute Hemorrhagic shock  - Secondary to acute blood loss anemia from GI bleed S/P Exp laparotomy for bleeding duodenal ulcer s/p duodenal plasty/ jejunostomy by general surgery, s/p 4 units PRBC/FFP, NM bleeding scan had shown some active bleed in the rectal area. EGD shown some bleed from duodenal area - s/p cautery -Hemodynamically stable presently hemoglobin stable.  Tolerating p.o. well.  No acute issue.  * AKI w/ CKD stage III Largely resolved with IV fluids for rehydration -Baseline creatinine around 1.5-1.6.  Currently at baseline.  * Chronic hypertension - cont. Coreg, Norvasc.  - BP stable.   * History of  alcoholism  - no evidence of withdrawal. Cont. Supportive care.    * Acute diarrhea - due to Norovirus/hx of recent C.diff.  - No acute issue. Now resolved.   * Pneumonia, possible HAP or aspiration pneumonia Treated with course of antibiotics and resolved.    *Acute respiratory  failure with hypoxia - due to pneumonia and resolved.   * GERD - cont. Protonix.   * Tobacco abuse - cont. Nicotine patch.   All the records are reviewed and case discussed with Care Management/Social Worker. Management plans discussed with the patient, family and they are in agreement.  CODE STATUS: DNR  DVT Prophylaxis: Ted's & SCD's.   TOTAL TIME TAKING CARE OF THIS PATIENT: 30 minutes.   POSSIBLE D/C unclear   Justin Andrews M.D on 05/23/2018 at 1:32 PM  Between 7am to 6pm - Pager - [phone]  After 6pm go to [email or web address] - Technical brewer [place] [facility]  Office  [phone]  CC: Primary care physician; Leone Haven, MD

## 2018-05-24 NOTE — Progress Notes (Signed)
[facility] at [place] NAME: Justin Andrews    MR#:  [id number]  DATE OF BIRTH:  1934-07-30  SUBJECTIVE:   No acute events overnight.  Pt. Awaiting placement.    REVIEW OF SYSTEMS:    Review of Systems  Constitutional: Negative for chills and fever.  HENT: Negative for congestion and tinnitus.   Eyes: Negative for blurred vision and double vision.  Respiratory: Negative for cough, shortness of breath and wheezing.   Cardiovascular: Negative for chest pain, orthopnea and PND.  Gastrointestinal: Negative for abdominal pain, diarrhea, nausea and vomiting.  Genitourinary: Negative for dysuria and hematuria.  Neurological: Negative for dizziness, sensory change and focal weakness.  All other systems reviewed and are negative.   Nutrition: Regular  Tolerating Diet: Yes Tolerating PT: Eval noted.   DRUG ALLERGIES:   Allergies  Allergen Reactions  . Aspirin Other (See Comments)    bleeding    VITALS:  Blood pressure (!) 130/59, pulse 89, temperature 98.3 F (36.8 C), temperature source Oral, resp. rate 19, height 5\' 5"  (1.651 m), weight 61.2 kg, SpO2 97 %.  PHYSICAL EXAMINATION:   Physical Exam  GENERAL:  84 y.o.-year-old patient lying in bed in no acute distress.  EYES: Pupils equal, round, reactive to light and accommodation. No scleral icterus. Extraocular muscles intact.  HEENT: Head atraumatic, normocephalic. Oropharynx and nasopharynx clear.  NECK:  Supple, no jugular venous distention. No thyroid enlargement, no tenderness.  LUNGS: Normal breath sounds bilaterally, no wheezing, rales, rhonchi. No use of accessory muscles of respiration.  CARDIOVASCULAR: S1, S2 normal. No murmurs, rubs, or gallops.  ABDOMEN: Soft, nontender, nondistended. Bowel sounds present. No organomegaly or mass.  Positive JP drain in the left side of the abdomen with no drainage noted.  Right-sided JG tube. EXTREMITIES: No cyanosis, clubbing or edema b/l.     NEUROLOGIC: Cranial nerves II through XII are intact. No focal Motor or sensory deficits b/l. PSYCHIATRIC: The patient is alert and oriented x 3.  SKIN: No obvious rash, lesion, or ulcer.    LABORATORY PANEL:   CBC Recent Labs  Lab 05/21/18 0427  WBC 6.8  HGB 8.5*  HCT 26.7*  PLT 449*   ------------------------------------------------------------------------------------------------------------------  Chemistries  Recent Labs  Lab 05/18/18 0423  05/21/18 0427  NA 144   < > 141  K 3.9   < > 3.8  CL 116*   < > 112*  CO2 21*   < > 23  GLUCOSE 107*   < > 90  BUN 31*   < > 18  CREATININE 1.94*   < > 1.79*  CALCIUM 8.1*   < > 8.0*  MG 2.2  --   --    < > = values in this interval not displayed.   ------------------------------------------------------------------------------------------------------------------  Cardiac Enzymes No results for input(s): TROPONINI in the last 168 hours. ------------------------------------------------------------------------------------------------------------------  RADIOLOGY:  No results found.   ASSESSMENT AND PLAN:   * Acute Hemorrhagic shock  - Secondary to acute blood loss anemia from GI bleed S/P Exp laparotomy for bleeding duodenal ulcer s/p duodenal plasty/ jejunostomy by general surgery, s/p 4 units PRBC/FFP, NM bleeding scan had shown some active bleed in the rectal area. EGD shown some bleed from duodenal area - s/p cautery -Hemodynamically stable presently hemoglobin stable.  Tolerating p.o. well.  No acute issue.  * AKI w/ CKD stage III Largely resolved with IV fluids for rehydration -Baseline creatinine around 1.5-1.6.  Currently at baseline.  *  Chronic hypertension - cont. Coreg, Norvasc.  - BP stable.   * History of alcoholism  - no evidence of withdrawal. Cont. Supportive care.    * Acute diarrhea - due to Norovirus/hx of recent C.diff.  - No acute issue. Now resolved.   * Pneumonia, possible HAP or  aspiration pneumonia Treated with course of antibiotics and resolved.    *Acute respiratory failure with hypoxia - due to pneumonia and resolved.   * GERD - cont. Protonix.   * Tobacco abuse - cont. Nicotine patch.   Awaiting placement to short-term rehab.  Will discuss further with social work tomorrow.   All the records are reviewed and case discussed with Care Management/Social Worker. Management plans discussed with the patient, family and they are in agreement.  CODE STATUS: DNR  DVT Prophylaxis: Ted's & SCD's.   TOTAL TIME TAKING CARE OF THIS PATIENT: 25 minutes.   POSSIBLE D/C unclear   Henreitta Leber M.D on 05/24/2018 at 1:49 PM  Between 7am to 6pm - Pager - [phone]  After 6pm go to [email or web address] - Technical brewer [place] [facility]  Office  [phone]  CC: Primary care physician; Leone Haven, MD

## 2018-05-24 NOTE — Progress Notes (Signed)
Pt refused to abide by bed alarm and safety measures when ambulating. Pt educated and safety mat as well as low bed in place. Will continue to monitor and reeducate.

## 2018-05-25 NOTE — Care Management Important Message (Signed)
Important Message  Patient Details  Name: RODRECUS BELSKY MRN: [id number] Date of Birth: December 14, 1934   Medicare Important Message Given:  Yes    Su Hilt, RN 05/25/2018, 10:51 AM

## 2018-05-25 NOTE — Evaluation (Signed)
Physical Therapy Re-Evaluation Patient Details Name: Justin Andrews MRN: [id number] DOB: 11/27/1934 Today's Date: 05/25/2018   History of Present Illness  Justin Andrews is an 84 yo male who comes to [facility] 4/25, found on the floor by neighbors minimally responsive in his stool/blood. Upon arrival, hypothermic, hypotensive on arrival and tachypneic, Hb: 3s. Per son, pt was advised to go to hospital by homehealth RN, but pt did not. Later afte rmore dark stools family called EMS, but pt refused to go to ED. PMH: C-diff, CKD, HTN, ETOH abuse, cigarrette use, Lt hip fracture Nov 2019 s/p ORIF and STR stay, recent fall with Right hip pain. Since admission pt is s/p multiple units PRBC and vascular procedure to address GI bleed.  Pt went back to OR 5/1 for ex-lab to address continued GIB.   Clinical Impression  PT re-evaluation performed d/t extended hospitalization.  Prior to hospital admission, pt reports not using any AD for ambulation (although prior notes indicate pt reported using RW).  Currently pt is modified independent with bed mobility; independent with transfers; and SBA ambulating 210 feet in room with RW (pt requesting to use RW for balance with walking).  Overall pt steady with ambulation using RW.  No c/o pain.  Cooperative during session.  Occasional general confusion noted.  Pt would benefit from skilled PT for higher level balance, strengthening, and transition to least restrictive assistive device for ambulation as appropriate.  Upon hospital discharge, recommend pt discharge with 24/7 supervision and HHPT.  POC reviewed and updated as appropriate.    Follow Up Recommendations Supervision/Assistance - 24 hour;Home health PT    Equipment Recommendations  Rolling walker with 5" wheels    Recommendations for Other Services       Precautions / Restrictions Precautions Precautions: Fall Restrictions Weight Bearing Restrictions: No      Mobility  Bed Mobility Overal bed mobility:  Modified Independent Bed Mobility: Supine to Sit;Sit to Supine     Supine to sit: Modified independent (Device/Increase time) Sit to supine: Modified independent (Device/Increase time)   General bed mobility comments: no difficulties noted  Transfers Overall transfer level: Needs assistance Equipment used: (single UE support) Transfers: Sit to/from Stand Sit to Stand: Independent         General transfer comment: pt able to stand from bed independently  Ambulation/Gait Ambulation/Gait assistance: Supervision Gait Distance (Feet): 210 Feet(in room) Assistive device: Rolling walker (2 wheeled)   Gait velocity: decreased   General Gait Details: partial step through gait pattern; pt requesting to use RW for balance (instead of trialing without AD); steady with RW use  Stairs            Wheelchair Mobility    Modified Rankin (Stroke Patients Only)       Balance Overall balance assessment: Needs assistance Sitting-balance support: No upper extremity supported;Feet supported Sitting balance-Leahy Scale: Normal Sitting balance - Comments: steady sitting reaching outside BOS   Standing balance support: No upper extremity supported Standing balance-Leahy Scale: Good Standing balance comment: steady standing reaching within BOS                             Pertinent Vitals/Pain Pain Assessment: Faces Pain Score: 0-No pain Pain Location: abdominal incision Pain Intervention(s): Limited activity within patient's tolerance;Monitored during session;Repositioned    Home Living Family/patient expects to be discharged to:: Unsure  Additional Comments: Per chart pt has been staying in halfway home prior to hospital admission    Prior Function           Comments: Pt reports no AD use normally although prior notes indicate pt reported ambulating with RW in house primarily (but struggled recently with longer than household distances  and had difficulty with entry steps at home)     Hand Dominance   Dominant Hand: Right    Extremity/Trunk Assessment   Upper Extremity Assessment Upper Extremity Assessment: Generalized weakness    Lower Extremity Assessment Lower Extremity Assessment: Generalized weakness    Cervical / Trunk Assessment Cervical / Trunk Assessment: Normal  Communication   Communication: HOH  Cognition Arousal/Alertness: Awake/alert Behavior During Therapy: WFL for tasks assessed/performed Overall Cognitive Status: Within Functional Limits for tasks assessed                                 General Comments: some impulsivity noted; decreased safety awareness      General Comments   Nursing cleared pt for participation in physical therapy.  Pt agreeable to PT session.    Exercises     Assessment/Plan    PT Assessment Patient needs continued PT services  PT Problem List Decreased strength;Decreased balance       PT Treatment Interventions DME instruction;Gait training;Stair training;Functional mobility training;Therapeutic activities;Therapeutic exercise;Balance training;Patient/family education    PT Goals (Current goals can be found in the Care Plan section)  Acute Rehab PT Goals Patient Stated Goal: improve strength and balance PT Goal Formulation: With patient Time For Goal Achievement: 06/08/18 Potential to Achieve Goals: Good    Frequency Min 2X/week   Barriers to discharge        Co-evaluation               AM-PAC PT "6 Clicks" Mobility  Outcome Measure Help needed turning from your back to your side while in a flat bed without using bedrails?: None Help needed moving from lying on your back to sitting on the side of a flat bed without using bedrails?: None Help needed moving to and from a bed to a chair (including a wheelchair)?: None Help needed standing up from a chair using your arms (e.g., wheelchair or bedside chair)?: None Help needed to  walk in hospital room?: A Little Help needed climbing 3-5 steps with a railing? : A Little 6 Click Score: 22    End of Session   Activity Tolerance: Patient tolerated treatment well Patient left: in bed;with call bell/phone within reach Nurse Communication: Mobility status;Precautions(Nurse reports pt has been ambulating independently in room (no alarm needed)) PT Visit Diagnosis: Muscle weakness (generalized) (M62.81);Other abnormalities of gait and mobility (R26.89);Difficulty in walking, not elsewhere classified (R26.2)    Time: 1355-1411 PT Time Calculation (min) (ACUTE ONLY): 16 min   Charges:   PT Evaluation $PT Re-evaluation: 1 Re-eval         Leitha Bleak, PT 05/25/18, 2:48 PM [phone]

## 2018-05-25 NOTE — TOC Progression Note (Signed)
Transition of Care [facility]) - Progression Note    Patient Details  Name: Justin Andrews MRN: [id number] Date of Birth: [birth year]  Transition of Care [facility]) CM/SW [place], [place] Phone Number: 05/25/2018, 3:11 PM  Clinical Narrative:   [facility] seen by [facility] director today for evaluation. Director Chrystie Nose) states that they can accept patient but he is refusing to go to group home. CSW met with patient to discuss. Patient states that he does not want to go to the group home because its [place] a bad part of town. CSW explained that this is the best option for patient for discharge. Patient reports that he does not need a group home and can go live with his brother. CSW reminded patient that his brother has already stated that he is unable to care for patient. Patient reports that he will go stay in a boarding house but is unable to give CSW the name or address of boarding house. Patient's son states that he is unable to care for him as well and would like patient placed. CSW spoke with Jazueline at [facility] and informed her of above. Hali Marry states that she will try and speak with patient regarding options as well. Per Jeanice Lim this group home was the only place willing to accept patient. CSW will continue to follow for discharge planning.     Expected Discharge Plan: Home/Self Care Barriers to Discharge: Continued Medical Work up  Expected Discharge Plan and Services Expected Discharge Plan: Home/Self Care   Discharge Planning Services: CM Consult   Living arrangements for the past 2 months: Single Family Home                 DME Arranged: (declines)         HH Arranged: (refuses)           Social Determinants of Health (SDOH) Interventions    Readmission Risk Interventions Readmission Risk Prevention Plan 05/07/2018  Transportation Screening Complete  PCP or Specialist Appt within 3-5 Days Complete  HRI or Home Care Consult Complete  Medication  Review (RN Care Manager) Complete  Some recent data might be hidden

## 2018-05-25 NOTE — Progress Notes (Signed)
[name] [place] NAME: Justin Andrews    MR#:  [id number]  DATE OF BIRTH:  [birth year]  SUBJECTIVE:   No complaints presently. Tolerating PO well.  Pt. Refusing to go to the group home.   REVIEW OF SYSTEMS:    Review of Systems  Constitutional: Negative for chills and fever.  HENT: Negative for congestion and tinnitus.   Eyes: Negative for blurred vision and double vision.  Respiratory: Negative for cough, shortness of breath and wheezing.   Cardiovascular: Negative for chest pain, orthopnea and PND.  Gastrointestinal: Negative for abdominal pain, diarrhea, nausea and vomiting.  Genitourinary: Negative for dysuria and hematuria.  Neurological: Negative for dizziness, sensory change and focal weakness.  All other systems reviewed and are negative.   Nutrition: Regular  Tolerating Diet: Yes Tolerating PT: Eval noted.   DRUG ALLERGIES:   Allergies  Allergen Reactions  . Aspirin Other (See Comments)    bleeding    VITALS:  Blood pressure (!) 111/46, pulse 75, temperature 97.7 F (36.5 C), temperature source Oral, resp. rate 16, height 5\' 5"  (1.651 m), weight 61.2 kg, SpO2 95 %.  PHYSICAL EXAMINATION:   Physical Exam  GENERAL:  84 y.o.-year-old patient lying in bed in no acute distress.  EYES: Pupils equal, round, reactive to light and accommodation. No scleral icterus. Extraocular muscles intact.  HEENT: Head atraumatic, normocephalic. Oropharynx and nasopharynx clear.  NECK:  Supple, no jugular venous distention. No thyroid enlargement, no tenderness.  LUNGS: Normal breath sounds bilaterally, no wheezing, rales, rhonchi. No use of accessory muscles of respiration.  CARDIOVASCULAR: S1, S2 normal. No murmurs, rubs, or gallops.  ABDOMEN: Soft, nontender, nondistended. Bowel sounds present. No organomegaly or mass.  Positive JP drain in the left side of the abdomen with no drainage noted.  Right-sided JP drain in place with  left-sided G-J tube in place.  EXTREMITIES: No cyanosis, clubbing or edema b/l.    NEUROLOGIC: Cranial nerves II through XII are intact. No focal Motor or sensory deficits b/l. PSYCHIATRIC: The patient is alert and oriented x 3.  SKIN: No obvious rash, lesion, or ulcer.    LABORATORY PANEL:   CBC Recent Labs  Lab 05/21/18 0427  WBC 6.8  HGB 8.5*  HCT 26.7*  PLT 449*   ------------------------------------------------------------------------------------------------------------------  Chemistries  Recent Labs  Lab 05/21/18 0427  NA 141  K 3.8  CL 112*  CO2 23  GLUCOSE 90  BUN 18  CREATININE 1.79*  CALCIUM 8.0*   ------------------------------------------------------------------------------------------------------------------  Cardiac Enzymes No results for input(s): TROPONINI in the last 168 hours. ------------------------------------------------------------------------------------------------------------------  RADIOLOGY:  No results found.   ASSESSMENT AND PLAN:   * Acute Hemorrhagic shock  - Secondary to acute blood loss anemia from GI bleed S/P Exp laparotomy for bleeding duodenal ulcer s/p duodenal plasty/ jejunostomy by general surgery, s/p 4 units PRBC/FFP, NM bleeding scan had shown some active bleed in the rectal area but no stopped. EGD shown some bleed from duodenal area - s/p cautery -Hemodynamically stable presently hemoglobin stable.  Tolerating p.o. well.  No acute issue.  * AKI w/ CKD stage III - Largely resolved with IV fluids for rehydration -Baseline creatinine around 1.5-1.6.  Currently at baseline.  * Chronic hypertension - cont. Coreg, Norvasc.  - BP stable.   * History of alcoholism  - no evidence of withdrawal. Cont. Supportive care.    * Acute diarrhea - due to Norovirus/hx of recent C.diff.  - No acute  issue. Now resolved.   * Pneumonia, possible HAP or aspiration pneumonia Treated with course of antibiotics and resolved.     *Acute respiratory failure with hypoxia - due to pneumonia and resolved.   * GERD - cont. Protonix.   * Tobacco abuse - cont. Nicotine patch.   Patient refuses to go to the group home.  Patient's son who I spoke to today says the patient does not have a place to stay as he was staying in a halfway home prior to admission.  We will need to discuss further with social work regarding disposition plans.  Possibly discharge to a hotel/motel tomorrow with his son.  All the records are reviewed and case discussed with Care Management/Social Worker. Management plans discussed with the patient, family and they are in agreement.  CODE STATUS: DNR  DVT Prophylaxis: Ted's & SCD's.   TOTAL TIME TAKING CARE OF THIS PATIENT: 35 minutes.   POSSIBLE D/C unclear  > 50% of time spent in coordinating care and speaking to social worker and also son over phone regarding disposition.    Henreitta Leber M.D on 05/25/2018 at 1:55 PM  Between 7am to 6pm - Pager - [phone]  After 6pm go to [email or web address] - Technical brewer [facility]  Office  [phone]  CC: Primary care physician; Leone Haven, MD

## 2018-05-26 ENCOUNTER — Inpatient Hospital Stay: Payer: Medicare HMO

## 2018-05-26 MED ORDER — CALCIUM CARBONATE-VITAMIN D 500-200 MG-UNIT PO TABS: 1.0000 | ORAL_TABLET | Freq: Two times a day (BID) | ORAL | 1 refills | Status: DC

## 2018-05-26 MED ORDER — CARVEDILOL 6.25 MG PO TABS: 6.2500 mg | ORAL_TABLET | Freq: Two times a day (BID) | ORAL | 1 refills | Status: DC

## 2018-05-26 MED ORDER — TRAMADOL HCL 50 MG PO TABS: 50.0000 mg | ORAL_TABLET | Freq: Three times a day (TID) | ORAL | 0 refills | Status: DC | PRN

## 2018-05-26 MED ORDER — TRAZODONE HCL 50 MG PO TABS: 50.0000 mg | ORAL_TABLET | Freq: Every evening | ORAL | 0 refills | Status: DC | PRN

## 2018-05-26 MED ORDER — PANTOPRAZOLE SODIUM 40 MG PO TBEC: 40.0000 mg | DELAYED_RELEASE_TABLET | Freq: Two times a day (BID) | ORAL | 1 refills | Status: DC

## 2018-05-26 MED ORDER — TAMSULOSIN HCL 0.4 MG PO CAPS: 0.4000 mg | ORAL_CAPSULE | Freq: Every day | ORAL | 1 refills | Status: DC

## 2018-05-26 MED ORDER — AMLODIPINE BESYLATE 10 MG PO TABS: 10.0000 mg | ORAL_TABLET | Freq: Every day | ORAL | 1 refills | Status: DC

## 2018-05-26 MED ORDER — LORAZEPAM 1 MG PO TABS
1.0000 mg | ORAL_TABLET | Freq: Once | ORAL | Status: AC
  Administered 2018-05-26: 1 mg via ORAL
  Filled 2018-05-26: qty 1

## 2018-05-26 NOTE — Progress Notes (Signed)
Witnessed patient turning off bed alarm. Patient is refusing bed alarm. Patient demonstrated steady gait. Floor mats removed. Primary RN made aware. Madlyn Frankel, RN

## 2018-05-26 NOTE — Progress Notes (Signed)
   05/26/18 1500  Clinical Encounter Type  Visited With Patient  Visit Type Follow-up  Referral From Nurse   Chaplain received a referral to visit the patient from the unit director. Upon arrival, the patient was sitting up in bed. He was awake, alert and oriented. 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 He shared about his family including living siblings, his parents, children and grandchildren. The patient also lamented the difficulty of this time in the global pandemic and noted that it has been "the hardest time he can remember since WWII." He said that he is looking forward to being discharged after his extended hospital stay and is counting down the days. He expressed appreciation for the visit.

## 2018-05-26 NOTE — Progress Notes (Signed)
Brief Progress Note Chart reviewed and patient seen briefly to help manage surgical drain as it appears he may be discharging today.   Patient sitting in chair tolerating breakfast. When examined, patient's JP bulb was found to be disconnected and tubing was draining on patient's pants. Patient found the bulb under his hospital bed and was unsure how long it was disconnected. Bulb had about 50 ccs of serous fluid present. RN emptied and replaced bulb. Patient again educated on the importance of not removing the bulb and notifying staff if it disconnects.   Will closely monitor output.  Once less than 30 ccs a day, we can remove this. If patient discharged with JP drain will need closer follow up with Dr Hampton Abbot (~1 week).  If JP can be removed prior to discharge, patients follow up can be further out.  Will continue to follow JP output and see patient accordingly.  Please call or secure chat with questions.   -- Edison Simon, PA-C [facility] 05/26/2018, 9:46 AM [phone] M-F: 7am - 4pm

## 2018-05-26 NOTE — Progress Notes (Signed)
Nutrition Follow-up  RD working remotely.  DOCUMENTATION CODES:   Not applicable  INTERVENTION:  Continue Ensure Enlive po TID, each supplement provides 350 kcal and 20 grams of protein.  Continue Magic cup TID with meals, each supplement provides 290 kcal and 9 grams of protein. Patient prefers vanilla.  NUTRITION DIAGNOSIS:   Predicted suboptimal nutrient intake related to (hx EtOH abuse, concern for self-neglect) as evidenced by (per chart, slow weight loss from UBW of 150 lbs since 2016).  Resolving.  GOAL:   Patient will meet greater than or equal to 90% of their needs  Progressing.  MONITOR:   Labs, Weight trends, I & O's, Skin, Other (Comment)(TPN)  REASON FOR ASSESSMENT:   Consult New TPN/TNA  ASSESSMENT:   84 year old male with PMHx of HTN, OA, tobacco abuse, EtOH abuse admitted with GI bleed secondary to oozing duodenal ulcer s/p bipolar cautery on 4/26, C Diff colitis, hemorrhagic shock.  Spoke with patient over the phone. He reports he is tired of the food here. He wants chicken livers with black-eyed peas and mashed potatoes. He reports he is eating "okay" here. Meal documentation is still not being completed. Patient reports he ate 60% of his breakfast this morning. He reports drinking 2-3 bottles of Ensure Enlive per day and reports he is eating some of his [employer].  Medications reviewed and include: nicotine patch, pantoprazole.  Labs reviewed.  Diet Order:   Diet Order            Diet regular Room service appropriate? Yes with Assist; Fluid consistency: Thin  Diet effective now             EDUCATION NEEDS:   Not appropriate for education at this time  Skin:  Skin Assessment: Skin Integrity Issues:(MSAD to groin; ecchymosis; closed incision to abdomen)  Last BM:  05/16/2018 - medium type 6  Height:   Ht Readings from Last 1 Encounters:  05/03/18 5\' 5"  (1.651 m)   Weight:   Wt Readings from Last 1 Encounters:  04/22/18 59.7 kg    Ideal Body Weight:  61.8 kg  BMI:  Body mass index is 22.45 kg/m.  Estimated Nutritional Needs:   Kcal:  1700-1900kcal/day   Protein:  85-95g/day   Fluid:  1.5-1.7 L/day  Willey Blade, MS, RD, LDN Office: [phone] Pager: [phone] After Hours/Weekend Pager: [phone]

## 2018-05-26 NOTE — TOC Transition Note (Signed)
Transition of Care [facility]) - CM/SW Discharge Note   Patient Details  Name: Justin Andrews MRN: [id number] Date of Birth: [birth year]  [facility] CM/SW Contact:  Annamaria Boots, [place] Phone Number: 05/26/2018, 2:53 PM   Clinical Narrative:   After much discussion with CSW, MD, son and DSS, patient has agreed to go to family care home. Patient is medically ready for discharge today. [facility] will accept patient today and pick up patient this afternoon. Patient needs no DME at this time and states that he does not want home health as well.     Final next level of care: Group Home Barriers to Discharge: No Barriers Identified   Patient Goals and CMS Choice Patient states their goals for this hospitalization and ongoing recovery are:: go home CMS Medicare.gov Compare Post Acute Care list provided to:: Patient Choice offered to / list presented to : Patient  Discharge Placement              Patient chooses bed at: ([facility] ) Patient to be transferred to facility by: Facility  Name of family member notified: Son- Adon Gehlhausen  Patient and family notified of of transfer: 05/26/18  Discharge Plan and Services   Discharge Planning Services: CM Consult            DME Arranged: (declines)         HH Arranged: (refuses)          Social Determinants of Health (SDOH) Interventions     Readmission Risk Interventions Readmission Risk Prevention Plan 05/07/2018  Transportation Screening Complete  PCP or Specialist Appt within 3-5 Days Complete  HRI or Home Care Consult Complete  Medication Review Press photographer) Complete  Some recent data might be hidden

## 2018-05-26 NOTE — Progress Notes (Signed)
Patient has agreed to go to group home but is very anxious about it, requesting something to help him relax, paged Dr. Verdell Carmine to make him aware. Verbal order with read back for ativan 1mg  PO once.  Clarise Cruz, BSN

## 2018-05-26 NOTE — Discharge Summary (Signed)
Justin Andrews at [place] NAME: Justin Andrews    MR#:  [id number]  DATE OF BIRTH:  02-01-1934  DATE OF ADMISSION:  05/02/2018 ADMITTING PHYSICIAN: Justin Basta, MD  DATE OF DISCHARGE: 05/26/2018  PRIMARY CARE PHYSICIAN: Justin Haven, MD    ADMISSION DIAGNOSIS:  [facility]) [R57.9] Acute gastrointestinal bleeding [K92.2] SIRS (systemic inflammatory response syndrome) (HCC) [R65.10] Hypothermia, initial encounter [T68.XXXA]  DISCHARGE DIAGNOSIS:  Principal Problem:   GI bleed Active Problems:   Hemorrhagic shock (HCC)   Altered mental status   Anemia due to blood loss, acute   SECONDARY DIAGNOSIS:   Past Medical History:  Diagnosis Date  . C. difficile diarrhea 11/2017  . Elevated serum creatinine   . History of melanoma   . Hypertension   . Osteoarthritis   . Tobacco abuse     HOSPITAL COURSE:   84 year old male with past medical history of hypertension, osteoarthritis, tobacco abuse, history of C. difficile who was admitted to the hospital secondary to acute hemorrhagic shock secondary to GI bleed from a bleeding duodenal ulcer.  * Acute Hemorrhagic shock  - Secondary to acute blood loss anemia from GI bleed.   S/P Exp laparotomy for bleeding duodenal ulcer s/p duodenal plasty/ jejunostomy by general surgery. s/p 4 units PRBC/FFP, NM bleeding scan had shown some active bleed in the rectal area but now stopped. EGD shown some bleed from duodenal area and patient is status post endoscopy and cauterization and bleeding is stopped. -Patient has been hemodynamically stable and hemoglobin has been stable for a long time.  He is tolerating p.o. well. -Patient has a right-sided JP drain in the left side GJ tube.  Patient is being discharged home and should follow-up with general surgery regarding plans for removing JP drain.  Patient should have a follow-up with general surgery with Dr. Hampton Andrews within a week after  discharge.  * AKI w/ CKD stage III- secondary to volume loss from GI bleed and has significantly improved with fluid resuscitation and as patient has received blood.  Patient's creatinine is currently at baseline at 1.5-1.6.  * Chronic hypertension - cont. Coreg, Norvasc.  - BP has been stable.   * History of alcoholism  - pt. Was on CIWA during hospital course and presently has no symptoms of withdrawal.   * Acute diarrhea - due to Norovirus/hx of recent C.diff.  - now resolved and pt. Is asymptomatic.  * Pneumonia - HCAP/aspiration pneumonia.  - resolved and treated with abx.   *Acute respiratory failure with hypoxia - due to pneumonia and now resolved.  Pt. Is on room air and no longer hypoxic.   * GERD - -pt. Will cont. Protonix.   He is being discharged to a family care home for further care and outpatient follow-up with general surgery.  DISCHARGE CONDITIONS:   Stable.   CONSULTS OBTAINED:  Treatment Team:  Justin Ree, MD Justin Glazier, MD Justin Husbands, MD  DRUG ALLERGIES:   Allergies  Allergen Reactions  . Aspirin Other (See Comments)    bleeding    DISCHARGE MEDICATIONS:   Allergies as of 05/26/2018      Reactions   Aspirin Other (See Comments)   bleeding      Medication List    STOP taking these medications   ascorbic acid 250 MG tablet Commonly known as:  VITAMIN C   [employer] VITAMIN B12 1000 MCG tablet Generic drug:  cyanocobalamin   diclofenac sodium 1 % Gel  Commonly known as:  VOLTAREN   ferrous sulfate 325 (65 FE) MG tablet   folic acid 1 MG tablet Commonly known as:  FOLVITE   gabapentin 100 MG capsule Commonly known as:  NEURONTIN   multivitamin with minerals Tabs tablet   ranitidine 150 MG tablet Commonly known as:  ZANTAC   thiamine 100 MG tablet   venlafaxine XR 37.5 MG 24 hr capsule Commonly known as:  EFFEXOR-XR     TAKE these medications   amLODipine 10 MG tablet Commonly known as:  NORVASC Take 1 tablet  (10 mg total) by mouth daily.   calcium-vitamin D 500-200 MG-UNIT tablet Commonly known as:  OSCAL WITH D Take 1 tablet by mouth 2 (two) times daily.   carvedilol 6.25 MG tablet Commonly known as:  COREG Take 1 tablet (6.25 mg total) by mouth 2 (two) times daily with a meal. What changed:  how much to take   feeding supplement (ENSURE ENLIVE) Liqd Take 237 mLs by mouth 2 (two) times daily between meals.   pantoprazole 40 MG tablet Commonly known as:  PROTONIX Take 1 tablet (40 mg total) by mouth 2 (two) times daily before a meal.   tamsulosin 0.4 MG Caps capsule Commonly known as:  FLOMAX Take 1 capsule (0.4 mg total) by mouth daily.   traMADol 50 MG tablet Commonly known as:  ULTRAM Take 1 tablet (50 mg total) by mouth every 8 (eight) hours as needed.   traZODone 50 MG tablet Commonly known as:  DESYREL Take 1 tablet (50 mg total) by mouth at bedtime as needed for up to 30 days for sleep.         DISCHARGE INSTRUCTIONS:   DIET:  Cardiac diet  DISCHARGE CONDITION:  Stable  ACTIVITY:  Activity as tolerated  OXYGEN:  Home Oxygen: No.   Oxygen Delivery: room air  DISCHARGE LOCATION:  Family care home.    If you experience worsening of your admission symptoms, develop shortness of breath, life threatening emergency, suicidal or homicidal thoughts you must seek medical attention immediately by calling 911 or calling your MD immediately  if symptoms less severe.  You Must read complete instructions/literature along with all the possible adverse reactions/side effects for all the Medicines you take and that have been prescribed to you. Take any new Medicines after you have completely understood and accpet all the possible adverse reactions/side effects.   Please note  You were cared for by a hospitalist during your hospital stay. If you have any questions about your discharge medications or the care you received while you were in the hospital after you are  discharged, you can call the unit and asked to speak with the hospitalist on call if the hospitalist that took care of you is not available. Once you are discharged, your primary care physician will handle any further medical issues. Please note that NO REFILLS for any discharge medications will be authorized once you are discharged, as it is imperative that you return to your primary care physician (or establish a relationship with a primary care physician if you do not have one) for your aftercare needs so that they can reassess your need for medications and monitor your lab values.     Today   No acute events overnight.  Hemodynamically stable.  Tolerating p.o. well.  Discussed with social work and patient and patient was possibly going to be discharged with his son to hotel/motel but patient social worker in [place] have agreed for him to  be placed in a family care home now.  VITAL SIGNS:  Blood pressure (!) 108/53, pulse 81, temperature 98.2 F (36.8 C), temperature source Oral, resp. rate 19, height 5\' 5"  (1.651 m), weight 61.2 kg, SpO2 93 %.  I/O:    Intake/Output Summary (Last 24 hours) at 05/26/2018 1306 Last data filed at 05/26/2018 0859 Gross per 24 hour  Intake 0 ml  Output 50 ml  Net -50 ml    PHYSICAL EXAMINATION:   GENERAL:  84 y.o.-year-old patient lying in bed in no acute distress.  EYES: Pupils equal, round, reactive to light and accommodation. No scleral icterus. Extraocular muscles intact.  HEENT: Head atraumatic, normocephalic. Oropharynx and nasopharynx clear.  NECK:  Supple, no jugular venous distention. No thyroid enlargement, no tenderness.  LUNGS: Normal breath sounds bilaterally, no wheezing, rales, rhonchi. No use of accessory muscles of respiration.  CARDIOVASCULAR: S1, S2 normal. No murmurs, rubs, or gallops.  ABDOMEN: Soft, nontender, nondistended. Bowel sounds present. No organomegaly or mass.  Positive JP drain in the left side of the abdomen with no  drainage noted.  Right-sided JP drain in place with left-sided G-J tube in place.  EXTREMITIES: No cyanosis, clubbing or edema b/l.    NEUROLOGIC: Cranial nerves II through XII are intact. No focal Motor or sensory deficits b/l. PSYCHIATRIC: The patient is alert and oriented x 3.  SKIN: No obvious rash, lesion, or ulcer.   DATA REVIEW:   CBC Recent Labs  Lab 05/21/18 0427  WBC 6.8  HGB 8.5*  HCT 26.7*  PLT 449*    Chemistries  Recent Labs  Lab 05/21/18 0427  NA 141  K 3.8  CL 112*  CO2 23  GLUCOSE 90  BUN 18  CREATININE 1.79*  CALCIUM 8.0*    Cardiac Enzymes No results for input(s): TROPONINI in the last 168 hours.  Microbiology Results  Results for orders placed or performed during the hospital encounter of 05/02/18  Culture, blood (Routine x 2)     Status: None   Collection Time: 05/02/18  6:53 AM  Result Value Ref Range Status   Specimen Description BLOOD LEFT ANTECUBITAL  Final   Special Requests   Final    BOTTLES DRAWN AEROBIC AND ANAEROBIC Blood Culture results may not be optimal due to an excessive volume of blood received in culture bottles   Culture   Final    NO GROWTH 5 DAYS Performed at [facility], [address], [place], [place] [address]    Report Status 05/07/2018 FINAL  Final  Culture, blood (Routine x 2)     Status: None   Collection Time: 05/02/18  6:53 AM  Result Value Ref Range Status   Specimen Description BLOOD RIGHT ANTECUBITAL  Final   Special Requests   Final    BOTTLES DRAWN AEROBIC AND ANAEROBIC Blood Culture adequate volume   Culture   Final    NO GROWTH 5 DAYS Performed at [facility], [address] [place], [place] [address]    Report Status 05/07/2018 FINAL  Final  Urine culture     Status: None   Collection Time: 05/02/18  7:41 AM  Result Value Ref Range Status   Specimen Description   Final    URINE, RANDOM Performed at [facility], [address] [place], [place] [address]     Special Requests   Final    NONE Performed at [facility], [address], [place],  [address]    Culture  Final    NO GROWTH Performed at [facility], [place] [address] [place], [place] [address]    Report Status 05/03/2018 FINAL  Final  SARS Coronavirus 2 [facility] order, Performed in [place] hospital lab)     Status: None   Collection Time: 05/02/18  7:41 AM  Result Value Ref Range Status   SARS Coronavirus 2 NEGATIVE NEGATIVE Final    Comment: (NOTE) If result is NEGATIVE SARS-CoV-2 target nucleic acids are NOT DETECTED. The SARS-CoV-2 RNA is generally detectable in upper and lower  respiratory specimens during the acute phase of infection. The lowest  concentration of SARS-CoV-2 viral copies this assay can detect is 250  copies / mL. A negative result does not preclude SARS-CoV-2 infection  and should not be used as the sole basis for treatment or other  patient management decisions.  A negative result may occur with  improper specimen collection / handling, submission of specimen other  than nasopharyngeal swab, presence of viral mutation(s) within the  areas targeted by this assay, and inadequate number of viral copies  (<250 copies / mL). A negative result must be combined with clinical  observations, patient history, and epidemiological information. If result is POSITIVE SARS-CoV-2 target nucleic acids are DETECTED. The SARS-CoV-2 RNA is generally detectable in upper and lower  respiratory specimens dur ing the acute phase of infection.  Positive  results are indicative of active infection with SARS-CoV-2.  Clinical  correlation with patient history and other diagnostic information is  necessary to determine patient infection status.  Positive results do  not rule out bacterial infection or co-infection with other viruses. If result is PRESUMPTIVE POSTIVE SARS-CoV-2 nucleic acids MAY BE PRESENT.   A presumptive positive result was obtained on  the submitted specimen  and confirmed on repeat testing.  While 2019 novel coronavirus  (SARS-CoV-2) nucleic acids may be present in the submitted sample  additional confirmatory testing may be necessary for epidemiological  and / or clinical management purposes  to differentiate between  SARS-CoV-2 and other Sarbecovirus currently known to infect humans.  If clinically indicated additional testing with an alternate test  methodology [phone]) is advised. The SARS-CoV-2 RNA is generally  detectable in upper and lower respiratory sp ecimens during the acute  phase of infection. The expected result is Negative. Fact Sheet for Patients:  StrictlyIdeas.no Fact Sheet for Healthcare Providers: BankingDealers.co.za This test is not yet approved or cleared by the [place] FDA and has been authorized for detection and/or diagnosis of SARS-CoV-2 by FDA under an Emergency Use Authorization (EUA).  This EUA will remain in effect (meaning this test can be used) for the duration of the COVID-19 declaration under Section 564(b)(1) of the Act, 21 U.S.C. section 360bbb-3(b)(1), unless the authorization is terminated or revoked sooner. Performed at [facility], [place], [place] [address]   C Difficile Quick Screen w PCR reflex     Status: Abnormal   Collection Time: 05/02/18  8:42 AM  Result Value Ref Range Status   C Diff antigen POSITIVE (A) NEGATIVE Final   C Diff toxin NEGATIVE NEGATIVE Final   C Diff interpretation Results are indeterminate. See PCR results.  Final    Comment: Performed at [facility], [address] [place], [place] [address]  Gastrointestinal Panel by PCR , Stool     Status: Abnormal   Collection Time: 05/02/18  8:42 AM  Result Value Ref Range Status   Campylobacter species NOT DETECTED NOT  DETECTED Final   Plesimonas shigelloides NOT DETECTED NOT DETECTED Final   Salmonella species  NOT DETECTED NOT DETECTED Final   Yersinia enterocolitica NOT DETECTED NOT DETECTED Final   Vibrio species NOT DETECTED NOT DETECTED Final   Vibrio cholerae NOT DETECTED NOT DETECTED Final   Enteroaggregative E coli (EAEC) NOT DETECTED NOT DETECTED Final   Enteropathogenic E coli (EPEC) NOT DETECTED NOT DETECTED Final   Enterotoxigenic E coli (ETEC) NOT DETECTED NOT DETECTED Final   Shiga like toxin producing E coli (STEC) NOT DETECTED NOT DETECTED Final   Shigella/Enteroinvasive E coli (EIEC) NOT DETECTED NOT DETECTED Final   Cryptosporidium NOT DETECTED NOT DETECTED Final   Cyclospora cayetanensis NOT DETECTED NOT DETECTED Final   Entamoeba histolytica NOT DETECTED NOT DETECTED Final   Giardia lamblia NOT DETECTED NOT DETECTED Final   Adenovirus F40/41 NOT DETECTED NOT DETECTED Final   Astrovirus NOT DETECTED NOT DETECTED Final   Norovirus GI/GII DETECTED (A) NOT DETECTED Final    Comment: RESULT CALLED TO, READ BACK BY AND VERIFIED WITH: JESSICA COLTRANE AT 1059 05/02/2018.PMF    Rotavirus A NOT DETECTED NOT DETECTED Final   Sapovirus (I, II, IV, and V) NOT DETECTED NOT DETECTED Final    Comment: Performed at [facility], [address] [place], [place] [address]  C. Diff by PCR, Reflexed     Status: Abnormal   Collection Time: 05/02/18  8:42 AM  Result Value Ref Range Status   Toxigenic C. Difficile by PCR POSITIVE (A) NEGATIVE Final    Comment: Positive for toxigenic C. difficile with little to no toxin production. Only treat if clinical presentation suggests symptomatic illness. Performed at [facility], [address] [place], [place] [address]   MRSA PCR Screening     Status: None   Collection Time: 05/02/18 11:54 AM  Result Value Ref Range Status   MRSA by PCR NEGATIVE NEGATIVE Final    Comment:        The GeneXpert MRSA Assay (FDA approved for NASAL specimens only), is one component of a comprehensive MRSA colonization surveillance program. It is  not intended to diagnose MRSA infection nor to guide or monitor treatment for MRSA infections. Performed at [facility], [address] [place], [place] [address]   KOH prep     Status: None   Collection Time: 05/03/18  6:16 PM  Result Value Ref Range Status   Specimen Description BRONCHIAL ALVEOLAR LAVAGE  Final   Special Requests NONE  Final   KOH Prep   Final    YEAST PRESENT Performed at [facility], [address] [name], [place] [address]    Report Status 05/04/2018 FINAL  Final  Culture, blood (routine x 2) Call MD if unable to obtain prior to antibiotics being given     Status: None   Collection Time: 05/13/18 10:41 AM  Result Value Ref Range Status   Specimen Description BLOOD LEFT ANTECUBITAL  Final   Special Requests   Final    BOTTLES DRAWN AEROBIC AND ANAEROBIC Blood Culture results may not be optimal due to an excessive volume of blood received in culture bottles   Culture   Final    NO GROWTH 5 DAYS Performed at [facility], [address] [place],  [address]    Report Status 05/18/2018 FINAL  Final  Culture, blood (routine x 2) Call MD if unable to obtain prior to antibiotics being given     Status: None   Collection  Time: 05/13/18 10:41 AM  Result Value Ref Range Status   Specimen Description BLOOD RIGHT ANTECUBITAL  Final   Special Requests   Final    BOTTLES DRAWN AEROBIC AND ANAEROBIC Blood Culture results may not be optimal due to an excessive volume of blood received in culture bottles   Culture   Final    NO GROWTH 5 DAYS Performed at [facility], [address] [place], [place] [address]    Report Status 05/18/2018 FINAL  Final    RADIOLOGY:  No results found.    Management plans discussed with the patient, family and they are in agreement.  CODE STATUS:     Code Status Orders  (From admission, onward)         Start     Ordered   05/02/18 1428  Do not attempt resuscitation (DNR)   Continuous    Question Answer Comment  In the event of cardiac or respiratory ARREST Do not call a "code blue"   In the event of cardiac or respiratory ARREST Do not perform Intubation, CPR, defibrillation or ACLS   In the event of cardiac or respiratory ARREST Use medication by any route, position, wound care, and other measures to relive pain and suffering. May use oxygen, suction and manual treatment of airway obstruction as needed for comfort.   Comments per patient's only child the patient is to be DNR per his father's prior expressed wishes. Still ok to receive blood, antibiotics, and other care but NO intubation and NO CPR      05/02/18 1427         TOTAL TIME TAKING CARE OF THIS PATIENT: 40 minutes.    Henreitta Leber M.D on 05/26/2018 at 1:06 PM  Between 7am to 6pm - Pager - [phone]  After 6pm go to [email or web address] - Technical brewer [facility]  Office  [phone]  CC: Primary care physician; Justin Haven, MD

## 2018-05-27 ENCOUNTER — Ambulatory Visit: Payer: Medicare HMO | Admitting: Family Medicine

## 2018-05-28 ENCOUNTER — Inpatient Hospital Stay
Admission: EM | Admit: 2018-05-28 | Discharge: 2018-06-17 | DRG: 908 | Disposition: A | Discharge status: Home Health | Payer: Medicare HMO | Attending: Internal Medicine | Admitting: Internal Medicine

## 2018-05-28 ENCOUNTER — Emergency Department: Payer: Medicare HMO

## 2018-05-28 ENCOUNTER — Telehealth: Payer: Self-pay

## 2018-05-28 ENCOUNTER — Other Ambulatory Visit: Payer: Self-pay

## 2018-05-28 ENCOUNTER — Encounter: Payer: Self-pay | Admitting: Emergency Medicine

## 2018-05-28 DIAGNOSIS — Z79899 Other long term (current) drug therapy: Secondary | ICD-10-CM

## 2018-05-28 DIAGNOSIS — F329 Major depressive disorder, single episode, unspecified: Secondary | ICD-10-CM | POA: Diagnosis present

## 2018-05-28 DIAGNOSIS — R109 Unspecified abdominal pain: Secondary | ICD-10-CM | POA: Diagnosis present

## 2018-05-28 DIAGNOSIS — I130 Hypertensive heart and chronic kidney disease with heart failure and stage 1 through stage 4 chronic kidney disease, or unspecified chronic kidney disease: Secondary | ICD-10-CM | POA: Diagnosis present

## 2018-05-28 DIAGNOSIS — I509 Heart failure, unspecified: Secondary | ICD-10-CM | POA: Diagnosis present

## 2018-05-28 DIAGNOSIS — R627 Adult failure to thrive: Secondary | ICD-10-CM | POA: Diagnosis present

## 2018-05-28 DIAGNOSIS — F1910 Other psychoactive substance abuse, uncomplicated: Secondary | ICD-10-CM | POA: Diagnosis present

## 2018-05-28 DIAGNOSIS — Z85820 Personal history of malignant melanoma of skin: Secondary | ICD-10-CM | POA: Diagnosis not present

## 2018-05-28 DIAGNOSIS — Z111 Encounter for screening for respiratory tuberculosis: Secondary | ICD-10-CM

## 2018-05-28 DIAGNOSIS — E860 Dehydration: Secondary | ICD-10-CM | POA: Diagnosis present

## 2018-05-28 DIAGNOSIS — R0600 Dyspnea, unspecified: Secondary | ICD-10-CM

## 2018-05-28 DIAGNOSIS — T8189XA Other complications of procedures, not elsewhere classified, initial encounter: Principal | ICD-10-CM | POA: Diagnosis present

## 2018-05-28 DIAGNOSIS — I700 Atherosclerosis of aorta: Secondary | ICD-10-CM | POA: Diagnosis not present

## 2018-05-28 DIAGNOSIS — Z87891 Personal history of nicotine dependence: Secondary | ICD-10-CM | POA: Diagnosis not present

## 2018-05-28 DIAGNOSIS — F17210 Nicotine dependence, cigarettes, uncomplicated: Secondary | ICD-10-CM | POA: Diagnosis present

## 2018-05-28 DIAGNOSIS — D638 Anemia in other chronic diseases classified elsewhere: Secondary | ICD-10-CM | POA: Diagnosis present

## 2018-05-28 DIAGNOSIS — R1084 Generalized abdominal pain: Secondary | ICD-10-CM

## 2018-05-28 DIAGNOSIS — Z1159 Encounter for screening for other viral diseases: Secondary | ICD-10-CM

## 2018-05-28 DIAGNOSIS — Z8701 Personal history of pneumonia (recurrent): Secondary | ICD-10-CM | POA: Diagnosis not present

## 2018-05-28 DIAGNOSIS — N183 Chronic kidney disease, stage 3 (moderate): Secondary | ICD-10-CM | POA: Diagnosis not present

## 2018-05-28 DIAGNOSIS — R197 Diarrhea, unspecified: Secondary | ICD-10-CM

## 2018-05-28 DIAGNOSIS — Z934 Other artificial openings of gastrointestinal tract status: Secondary | ICD-10-CM | POA: Diagnosis not present

## 2018-05-28 DIAGNOSIS — R69 Illness, unspecified: Secondary | ICD-10-CM | POA: Diagnosis not present

## 2018-05-28 DIAGNOSIS — R4182 Altered mental status, unspecified: Secondary | ICD-10-CM | POA: Diagnosis present

## 2018-05-28 DIAGNOSIS — A0472 Enterocolitis due to Clostridium difficile, not specified as recurrent: Secondary | ICD-10-CM | POA: Diagnosis present

## 2018-05-28 DIAGNOSIS — Z79891 Long term (current) use of opiate analgesic: Secondary | ICD-10-CM

## 2018-05-28 DIAGNOSIS — R404 Transient alteration of awareness: Secondary | ICD-10-CM | POA: Diagnosis not present

## 2018-05-28 DIAGNOSIS — N281 Cyst of kidney, acquired: Secondary | ICD-10-CM | POA: Diagnosis not present

## 2018-05-28 DIAGNOSIS — Z66 Do not resuscitate: Secondary | ICD-10-CM | POA: Diagnosis present

## 2018-05-28 DIAGNOSIS — N179 Acute kidney failure, unspecified: Secondary | ICD-10-CM | POA: Diagnosis present

## 2018-05-28 DIAGNOSIS — I10 Essential (primary) hypertension: Secondary | ICD-10-CM | POA: Diagnosis not present

## 2018-05-28 DIAGNOSIS — K219 Gastro-esophageal reflux disease without esophagitis: Secondary | ICD-10-CM | POA: Diagnosis present

## 2018-05-28 DIAGNOSIS — N184 Chronic kidney disease, stage 4 (severe): Secondary | ICD-10-CM | POA: Diagnosis present

## 2018-05-28 DIAGNOSIS — N400 Enlarged prostate without lower urinary tract symptoms: Secondary | ICD-10-CM | POA: Diagnosis present

## 2018-05-28 DIAGNOSIS — R45851 Suicidal ideations: Secondary | ICD-10-CM | POA: Diagnosis present

## 2018-05-28 DIAGNOSIS — Z20828 Contact with and (suspected) exposure to other viral communicable diseases: Secondary | ICD-10-CM | POA: Diagnosis not present

## 2018-05-28 DIAGNOSIS — Z590 Homelessness: Secondary | ICD-10-CM | POA: Diagnosis not present

## 2018-05-28 DIAGNOSIS — M1990 Unspecified osteoarthritis, unspecified site: Secondary | ICD-10-CM | POA: Diagnosis present

## 2018-05-28 DIAGNOSIS — D649 Anemia, unspecified: Secondary | ICD-10-CM | POA: Diagnosis not present

## 2018-05-28 DIAGNOSIS — Z9889 Other specified postprocedural states: Secondary | ICD-10-CM | POA: Diagnosis not present

## 2018-05-28 DIAGNOSIS — K668 Other specified disorders of peritoneum: Secondary | ICD-10-CM | POA: Diagnosis not present

## 2018-05-28 DIAGNOSIS — R1013 Epigastric pain: Secondary | ICD-10-CM | POA: Diagnosis not present

## 2018-05-28 DIAGNOSIS — A498 Other bacterial infections of unspecified site: Secondary | ICD-10-CM | POA: Diagnosis not present

## 2018-05-28 LAB — CBC
HCT: 31.1 % — ABNORMAL LOW (ref 39.0–52.0)
Hemoglobin: 10 g/dL — ABNORMAL LOW (ref 13.0–17.0)
MCH: 29.3 pg (ref 26.0–34.0)
MCHC: 32.2 g/dL (ref 30.0–36.0)
MCV: 91.2 fL (ref 80.0–100.0)
Platelets: 431 10*3/uL — ABNORMAL HIGH (ref 150–400)
RBC: 3.41 MIL/uL — ABNORMAL LOW (ref 4.22–5.81)
RDW: 14.6 % (ref 11.5–15.5)
WBC: 10.2 10*3/uL (ref 4.0–10.5)
nRBC: 0 % (ref 0.0–0.2)

## 2018-05-28 LAB — COMPREHENSIVE METABOLIC PANEL
ALT: 10 U/L (ref 0–44)
AST: 15 U/L (ref 15–41)
Albumin: 2.4 g/dL — ABNORMAL LOW (ref 3.5–5.0)
Alkaline Phosphatase: 93 U/L (ref 38–126)
Anion gap: 8 (ref 5–15)
BUN: 28 mg/dL — ABNORMAL HIGH (ref 8–23)
CO2: 23 mmol/L (ref 22–32)
Calcium: 8.3 mg/dL — ABNORMAL LOW (ref 8.9–10.3)
Chloride: 106 mmol/L (ref 98–111)
Creatinine, Ser: 2.32 mg/dL — ABNORMAL HIGH (ref 0.61–1.24)
GFR calc Af Amer: 29 mL/min — ABNORMAL LOW (ref >60)
GFR calc non Af Amer: 25 mL/min — ABNORMAL LOW (ref >60)
Glucose, Bld: 154 mg/dL — ABNORMAL HIGH (ref 70–99)
Potassium: 4.4 mmol/L (ref 3.5–5.1)
Sodium: 137 mmol/L (ref 135–145)
Total Bilirubin: 0.1 mg/dL — ABNORMAL LOW (ref 0.3–1.2)
Total Protein: 6.3 g/dL — ABNORMAL LOW (ref 6.5–8.1)

## 2018-05-28 LAB — LIPASE, BLOOD: Lipase: 42 U/L (ref 11–51)

## 2018-05-28 LAB — URINALYSIS, COMPLETE (UACMP) WITH MICROSCOPIC
Bilirubin Urine: NEGATIVE
Glucose, UA: NEGATIVE mg/dL
Hgb urine dipstick: NEGATIVE
Ketones, ur: NEGATIVE mg/dL
Nitrite: NEGATIVE
Protein, ur: 30 mg/dL — AB
Specific Gravity, Urine: 1.016 (ref 1.005–1.030)
pH: 5 (ref 5.0–8.0)

## 2018-05-28 LAB — SARS CORONAVIRUS 2 BY RT PCR (HOSPITAL ORDER, PERFORMED IN ~~LOC~~ HOSPITAL LAB): SARS Coronavirus 2: NEGATIVE

## 2018-05-28 NOTE — Telephone Encounter (Signed)
Please contact the patients son and see what he would like to discuss. If it is urgent please forward to [facility] to review. I am happy to speak with him once I am back in the office.

## 2018-05-28 NOTE — Telephone Encounter (Signed)
Copied from [place] [phone]. Topic: General - Inquiry >> May 28, 2018  1:42 PM Rutherford Nail, [place] wrote: Reason for CRM: Patient's son, Schear, would like to know if Dr Caryl Bis could give him a call in regards to the patient. States that patient was hospitalized and is out now, but there are some other things that he would like to discuss asap. Please advise. CB#: [phone] States that a voicemail can be left if he does not answer and he will call back as soon as he is able.

## 2018-05-28 NOTE — ED Notes (Signed)
Patient has two drains coming from the lower abdomen area

## 2018-05-28 NOTE — ED Notes (Signed)
Meal given

## 2018-05-28 NOTE — ED Provider Notes (Signed)
[facility] Emergency Department Provider Note    First MD Initiated Contact with Patient 05/28/18 1826     (approximate)  I have reviewed the triage vital signs and the nursing notes.   HISTORY  Chief Complaint Abdominal Pain and Post-op Problem    HPI Justin Andrews is a 84 y.o. male Lucita Ferrara past medical history lives at home alone presents the ER 2 days after discharge after extensive and prolonged hospitalization presents the ER for worsening abdominal pain diarrhea generalized malaise.  Also having difficulty caring for his JP drain.  Is unable to keep any food down secondary to nausea vomiting and is having watery diarrhea.    Past Medical History:  Diagnosis Date  . C. difficile diarrhea 11/2017  . Elevated serum creatinine   . History of melanoma   . Hypertension   . Osteoarthritis   . Tobacco abuse    Family History  Problem Relation Age of Onset  . Prostate cancer Neg Hx   . Bladder Cancer Neg Hx   . Kidney cancer Neg Hx    Past Surgical History:  Procedure Laterality Date  . ANKLE FRACTURE SURGERY    . CENTRAL VENOUS CATHETER INSERTION  05/08/2018   Procedure: INSERTION CENTRAL LINE ADULT;  Surgeon: Olean Ree, MD;  Location: [facility] ORS;  Service: General;;  . ESOPHAGOGASTRODUODENOSCOPY  05/08/2018   Procedure: ESOPHAGOGASTRODUODENOSCOPY (EGD);  Surgeon: Olean Ree, MD;  Location: [facility] ORS;  Service: General;;  . ESOPHAGOGASTRODUODENOSCOPY (EGD) WITH PROPOFOL N/A 05/03/2018   Procedure: ESOPHAGOGASTRODUODENOSCOPY (EGD) WITH PROPOFOL;  Surgeon: Toledo, Benay Pike, MD;  Location: [facility] ENDOSCOPY;  Service: Gastroenterology;  Laterality: N/A;  . EXTERNAL EAR SURGERY    . INTRAMEDULLARY (IM) NAIL INTERTROCHANTERIC Left 12/01/2017   Procedure: INTRAMEDULLARY (IM) NAIL INTERTROCHANTRIC;  Surgeon: Thornton Park, MD;  Location: [facility] ORS;  Service: Orthopedics;  Laterality: Left;  . JEJUNOSTOMY  05/08/2018   Procedure: JEJUNOSTOMY;  Surgeon:  Olean Ree, MD;  Location: [facility] ORS;  Service: General;;  . LAPAROTOMY N/A 05/08/2018   Procedure: EXPLORATORY LAPAROTOMY;  Surgeon: Olean Ree, MD;  Location: [facility] ORS;  Service: General;  Laterality: N/A;  . ROTATOR CUFF REPAIR    . TONSILLECTOMY    . TYMPANOPLASTY W/ MASTOIDECTOMY     Patient with reports of mastoid surgery (appears to have had surgery on TM; Left).  . VISCERAL ARTERY INTERVENTION N/A 05/04/2018   Procedure: VISCERAL ARTERY INTERVENTION;  Surgeon: Algernon Huxley, MD;  Location: [place] [facility];  Service: Cardiovascular;  Laterality: N/A;   Patient Active Problem List   Diagnosis Date Noted  . GI bleed 05/02/2018  . Hemorrhagic shock ([place]) 05/02/2018  . Altered mental status 05/02/2018  . Anemia due to blood loss, acute 05/02/2018  . RLS (restless legs syndrome) 04/22/2018  . Wrist pain 04/22/2018  . Right hip pain 04/08/2018  . Otitis media 03/19/2018  . Suicidal ideation 03/19/2018  . Protein-calorie malnutrition, severe 12/03/2017  . Closed left hip fracture ([place]) 11/30/2017  . Diarrhea 11/15/2017  . Duodenum disorder 11/15/2017  . Frequency of urination 10/21/2017  . Cigarette smoker 10/06/2017  . Cough 09/25/2017  . Changes in vision 09/25/2017  . Proteinuria 09/25/2017  . Basal cell carcinoma 02/28/2017  . 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 DOE (dyspnea on exertion) 01/17/2017  . Chronic right upper quadrant pain 11/15/2016  . Balance problem 11/15/2016  . Simple renal cyst 10/21/2016  . Prostate nodule 10/16/2016  . Dysuria 10/16/2016  . CKD (chronic kidney  disease) stage 3, GFR 30-59 ml/min (HCC) 08/05/2016  . Anemia 08/05/2016  . Anxiety and depression 08/05/2016  . Weight loss 08/05/2016  . Sleep disturbance 07/23/2016  . Hypertension   . Osteoarthritis   . Tobacco abuse   . 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[place]) 03/06/2016      Prior to Admission medications   Medication Sig Start Date End Date Taking? Authorizing  Provider  amLODipine (NORVASC) 10 MG tablet Take 1 tablet (10 mg total) by mouth daily. 05/26/18 07/25/18 Yes Sainani, Belia Heman, MD  calcium-vitamin D (OSCAL WITH D) 500-200 MG-UNIT tablet Take 1 tablet by mouth 2 (two) times daily. 05/26/18  Yes Henreitta Leber, MD  carvedilol (COREG) 6.25 MG tablet Take 1 tablet (6.25 mg total) by mouth 2 (two) times daily with a meal. 05/26/18 07/25/18 Yes Sainani, Belia Heman, MD  pantoprazole (PROTONIX) 40 MG tablet Take 1 tablet (40 mg total) by mouth 2 (two) times daily before a meal. 05/26/18 07/25/18 Yes Sainani, Belia Heman, MD  tamsulosin (FLOMAX) 0.4 MG CAPS capsule Take 1 capsule (0.4 mg total) by mouth daily. 05/26/18 07/25/18 Yes Sainani, Belia Heman, MD  traMADol (ULTRAM) 50 MG tablet Take 1 tablet (50 mg total) by mouth every 8 (eight) hours as needed. 05/26/18  Yes Henreitta Leber, MD  traZODone (DESYREL) 50 MG tablet Take 1 tablet (50 mg total) by mouth at bedtime as needed for up to 30 days for sleep. 05/26/18 06/25/18 Yes Sainani, Belia Heman, MD  feeding supplement, ENSURE ENLIVE, (ENSURE ENLIVE) LIQD Take 237 mLs by mouth 2 (two) times daily between meals. 12/03/17   Fritzi Mandes, MD    Allergies Aspirin    Social History Social History   Tobacco Use  . Smoking status: Current Every Day Smoker    Packs/day: 1.00    Years: 70.00    Pack years: 70.00    Types: Cigarettes  . Smokeless tobacco: Never Used  Substance Use Topics  . Alcohol use: No  . Drug use: No    Review of Systems Patient denies headaches, rhinorrhea, blurry vision, numbness, shortness of breath, chest pain, edema, cough, abdominal pain, nausea, vomiting, diarrhea, dysuria, fevers, rashes or hallucinations unless otherwise stated above in HPI. ____________________________________________   PHYSICAL EXAM:  VITAL SIGNS: Vitals:   05/28/18 2000 05/28/18 2030  BP: 130/77 122/64  Pulse: 80 83  Resp:    Temp:    SpO2: 98% 99%    Constitutional: Alert.  Able to ambulate. proteting  his airway Eyes: Conjunctivae are normal.  Head: Atraumatic. Nose: No congestion/rhinnorhea. Mouth/Throat: Mucous membranes are dry.   Neck: No stridor. Painless ROM.  Cardiovascular: Normal rate, regular rhythm. Grossly normal heart sounds.  Good peripheral circulation. Respiratory: Normal respiratory effort.  No retractions. Lungs CTAB. Gastrointestinal: Laparotomy incision site appears clean dry and intact.  Right JP drain without bulb draining to air.  Left feeding tube currently capped.  There is some surrounding erythema but no drainage to either site.  His abdominal exam is tender  genitourinary:  Musculoskeletal: No lower extremity tenderness nor edema.  No joint effusions. Neurologic:  Normal speech and language. No gross focal neurologic deficits are appreciated. No facial droop Skin:  Skin is warm, dry and intact. No rash noted. Psychiatric: Mood and affect are normal.   ____________________________________________   LABS (all labs ordered are listed, but only abnormal results are displayed)  Results for orders placed or performed during the hospital encounter of 05/28/18 (from the past 24 hour(s))  Lipase, blood     Status: None   Collection Time: 05/28/18  4:54 PM  Result Value Ref Range   Lipase 42 11 - 51 U/L  Comprehensive metabolic panel     Status: Abnormal   Collection Time: 05/28/18  4:54 PM  Result Value Ref Range   Sodium 137 135 - 145 mmol/L   Potassium 4.4 3.5 - 5.1 mmol/L   Chloride 106 98 - 111 mmol/L   CO2 23 22 - 32 mmol/L   Glucose, Bld 154 (H) 70 - 99 mg/dL   BUN 28 (H) 8 - 23 mg/dL   Creatinine, Ser 2.32 (H) 0.61 - 1.24 mg/dL   Calcium 8.3 (L) 8.9 - 10.3 mg/dL   Total Protein 6.3 (L) 6.5 - 8.1 g/dL   Albumin 2.4 (L) 3.5 - 5.0 g/dL   AST 15 15 - 41 U/L   ALT 10 0 - 44 U/L   Alkaline Phosphatase 93 38 - 126 U/L   Total Bilirubin 0.1 (L) 0.3 - 1.2 mg/dL   GFR calc non Af Amer 25 (L) >60 mL/min   GFR calc Af Amer 29 (L) >60 mL/min   Anion gap 8 5  - 15  CBC     Status: Abnormal   Collection Time: 05/28/18  4:54 PM  Result Value Ref Range   WBC 10.2 4.0 - 10.5 K/uL   RBC 3.41 (L) 4.22 - 5.81 MIL/uL   Hemoglobin 10.0 (L) 13.0 - 17.0 g/dL   HCT 31.1 (L) 39.0 - 52.0 %   MCV 91.2 80.0 - 100.0 fL   MCH 29.3 26.0 - 34.0 pg   MCHC 32.2 30.0 - 36.0 g/dL   RDW 14.6 11.5 - 15.5 %   Platelets 431 (H) 150 - 400 K/uL   nRBC 0.0 0.0 - 0.2 %  Urinalysis, Complete w Microscopic     Status: Abnormal   Collection Time: 05/28/18  4:54 PM  Result Value Ref Range   Color, Urine YELLOW (A) YELLOW   APPearance HAZY (A) CLEAR   Specific Gravity, Urine 1.016 1.005 - 1.030   pH 5.0 5.0 - 8.0   Glucose, UA NEGATIVE NEGATIVE mg/dL   Hgb urine dipstick NEGATIVE NEGATIVE   Bilirubin Urine NEGATIVE NEGATIVE   Ketones, ur NEGATIVE NEGATIVE mg/dL   Protein, ur 30 (A) NEGATIVE mg/dL   Nitrite NEGATIVE NEGATIVE   Leukocytes,Ua TRACE (A) NEGATIVE   RBC / HPF 0-5 0 - 5 RBC/hpf   WBC, UA 0-5 0 - 5 WBC/hpf   Bacteria, UA RARE (A) NONE SEEN   Squamous Epithelial / LPF 0-5 0 - 5   Mucus PRESENT    Hyaline Casts, UA PRESENT   SARS Coronavirus 2 (CEPHEID - Performed in [place] hospital lab), Hosp Order     Status: None   Collection Time: 05/28/18  7:23 PM  Result Value Ref Range   SARS Coronavirus 2 NEGATIVE NEGATIVE    ____________________________________________  RADIOLOGY  I personally reviewed all radiographic images ordered to evaluate for the above acute complaints and reviewed radiology reports and findings.  These findings were personally discussed with the patient.  Please see medical record for radiology report.  ____________________________________________   PROCEDURES  Procedure(s) performed:  Procedures    Critical Care performed: no ____________________________________________   INITIAL IMPRESSION / ASSESSMENT AND PLAN / ED COURSE  Pertinent labs & imaging results that were available during my care of the patient were  reviewed by me and considered in my medical decision making (see  chart for details).   DDX: abscess, colitis, perforation, homelessness  RAFI KENNETH is a 84 y.o. who presents to the ED with was as described above.  Patient with extensive past medical history.  Clearly is unable to care for himself.  Discussed case with general surgery says that JP drain was not initially putting out much and was uncertain as to the need for that to remain however I do suspect that patient's pain is worsening due to infiltration of outside air as there is no longer a bulb on the JP.  Will order CT imaging blood work and will consult hospitalist for admission.  Clinical Course as of May 27 2353  Thu May 28, 2018  2109 Will send urine for culture.  Will consult with hospitalist for admission.   [PR]    Clinical Course User Index [PR] Merlyn Lot, MD    The patient was evaluated in Emergency Department today for the symptoms described in the history of present illness. He/she was evaluated in the context of the global COVID-19 pandemic, which necessitated consideration that the patient might be at risk for infection with the SARS-CoV-2 virus that causes COVID-19. Institutional protocols and algorithms that pertain to the evaluation of patients at risk for COVID-19 are in a state of rapid change based on information released by regulatory bodies including the CDC and federal and state organizations. These policies and algorithms were followed during the patient's care in the ED.  As part of my medical decision making, I reviewed the following data within the [place] notes reviewed and incorporated, Labs reviewed, notes from prior ED visits and  Controlled Substance Database   ____________________________________________   FINAL CLINICAL IMPRESSION(S) / ED DIAGNOSES  Final diagnoses:  Diarrhea, unspecified type  Dehydration  Generalized abdominal pain      NEW  MEDICATIONS STARTED DURING THIS VISIT:  New Prescriptions   No medications on file     Note:  This document was prepared using Dragon voice recognition software and may include unintentional dictation errors.    Merlyn Lot, MD 05/28/18 [phone]

## 2018-05-28 NOTE — Progress Notes (Signed)
05/28/18 9:30 pm  Discussed patient with Dr. Quentin Cornwall.  84 yo male s/p exlap, repair of bleeding duodenal ulcer, duodenoplasty, and feeding J tube on 5/1.  Was discharged to group home on 5/19 and presents today with abdominal pain and diarrhea.  Has RUQ JP drain and LUQ feeding J-tube.  JP drain however is not connected to bulb and is open to air.  J tube is capped.  WBC normal but with worsening Cr at 2.32.  CT scan done and personally viewed.  Shows some bubbles of air around the liver dome, but no large pneumoperitoneum.  There are post-op changes and inflammation around the repair site, but no colonic wall thickening.  Free air can be explained by the fact that his drain is not connected to suction bulb, allowing air to get into abdomen through drain.  Do not believe there is a new perforation or repair breakdown.  C-diff test ordered and pending patient giving stool sample.    Recommend admitting to medicine, keep NPO tonight as a precaution, follow c-diff test and treat if appropriate.  Recommend IV PPI given ulcer history.  Will follow along.  Full consult note to follow in AM.  Olean Ree, MD

## 2018-05-28 NOTE — ED Notes (Signed)
Pt resting quietly. Pt reminded to ring the call bell when he is ok to give a stool sample.

## 2018-05-28 NOTE — ED Triage Notes (Signed)
Patient reports having endoscopy and exploratory lap done this week due to GI bleed. States he was discharged on 5/19 and since has had worsening pain to incision sites. Patient also reports generalized fatigue and states "everything just runs right through me. I can't keep nothing in". Patient denies N/V or fever.

## 2018-05-28 NOTE — ED Notes (Signed)
Pt asking for a telephone to call his brother. Pt does not want to stay in the hospital.

## 2018-05-28 NOTE — ED Notes (Signed)
Pt ambulated from triage to room 4. A&Ox4, NAD, no SOB noted. States pain 7/10 in abdominal area x 3 weeks

## 2018-05-29 ENCOUNTER — Inpatient Hospital Stay: Payer: Medicare HMO

## 2018-05-29 LAB — CBC
HCT: 27.5 % — ABNORMAL LOW (ref 39.0–52.0)
Hemoglobin: 8.8 g/dL — ABNORMAL LOW (ref 13.0–17.0)
MCH: 28.9 pg (ref 26.0–34.0)
MCHC: 32 g/dL (ref 30.0–36.0)
MCV: 90.5 fL (ref 80.0–100.0)
Platelets: 373 10*3/uL (ref 150–400)
RBC: 3.04 MIL/uL — ABNORMAL LOW (ref 4.22–5.81)
RDW: 14.7 % (ref 11.5–15.5)
WBC: 8 10*3/uL (ref 4.0–10.5)
nRBC: 0 % (ref 0.0–0.2)

## 2018-05-29 LAB — C DIFFICILE QUICK SCREEN W PCR REFLEX
C Diff antigen: POSITIVE — AB
C Diff interpretation: DETECTED
C Diff toxin: POSITIVE — AB

## 2018-05-29 LAB — TROPONIN I: Troponin I: <0.03 ng/mL (ref <0.03)

## 2018-05-29 LAB — BASIC METABOLIC PANEL
Anion gap: 7 (ref 5–15)
BUN: 27 mg/dL — ABNORMAL HIGH (ref 8–23)
CO2: 22 mmol/L (ref 22–32)
Calcium: 8 mg/dL — ABNORMAL LOW (ref 8.9–10.3)
Chloride: 110 mmol/L (ref 98–111)
Creatinine, Ser: 2.12 mg/dL — ABNORMAL HIGH (ref 0.61–1.24)
GFR calc Af Amer: 33 mL/min — ABNORMAL LOW (ref >60)
GFR calc non Af Amer: 28 mL/min — ABNORMAL LOW (ref >60)
Glucose, Bld: 109 mg/dL — ABNORMAL HIGH (ref 70–99)
Potassium: 3.5 mmol/L (ref 3.5–5.1)
Sodium: 139 mmol/L (ref 135–145)

## 2018-05-29 MED ORDER — CARVEDILOL 3.125 MG PO TABS
6.2500 mg | ORAL_TABLET | Freq: Two times a day (BID) | ORAL | Status: DC
  Administered 2018-05-29 – 2018-06-02 (×9): 6.25 mg via ORAL
  Filled 2018-05-29 (×10): qty 2

## 2018-05-29 MED ORDER — TRAMADOL HCL 50 MG PO TABS
50.0000 mg | ORAL_TABLET | Freq: Four times a day (QID) | ORAL | Status: DC | PRN
  Administered 2018-05-29 – 2018-06-08 (×29): 50 mg via ORAL
  Filled 2018-05-29 (×30): qty 1

## 2018-05-29 MED ORDER — POLYETHYLENE GLYCOL 3350 17 G PO PACK: 17.0000 g | PACK | Freq: Every day | ORAL | Status: DC | PRN

## 2018-05-29 MED ORDER — LORAZEPAM 2 MG/ML IJ SOLN
1.0000 mg | Freq: Four times a day (QID) | INTRAMUSCULAR | Status: AC | PRN
  Administered 2018-05-29: 04:00:00 1 mg via INTRAVENOUS
  Filled 2018-05-29: qty 1

## 2018-05-29 MED ORDER — THIAMINE HCL 100 MG/ML IJ SOLN: 100.0000 mg | Freq: Every day | INTRAMUSCULAR | Status: DC

## 2018-05-29 MED ORDER — LORAZEPAM 1 MG PO TABS
1.0000 mg | ORAL_TABLET | Freq: Four times a day (QID) | ORAL | Status: AC | PRN
  Administered 2018-05-30 – 2018-05-31 (×3): 1 mg via ORAL
  Filled 2018-05-29 (×3): qty 1

## 2018-05-29 MED ORDER — MORPHINE SULFATE (PF) 2 MG/ML IV SOLN
2.0000 mg | INTRAVENOUS | Status: AC | PRN
  Administered 2018-05-29 (×3): 2 mg via INTRAVENOUS
  Filled 2018-05-29 (×3): qty 1

## 2018-05-29 MED ORDER — ACETAMINOPHEN 325 MG PO TABS
650.0000 mg | ORAL_TABLET | Freq: Four times a day (QID) | ORAL | Status: DC | PRN
  Administered 2018-06-01 – 2018-06-05 (×4): 650 mg via ORAL
  Filled 2018-05-29 (×4): qty 2

## 2018-05-29 MED ORDER — VANCOMYCIN 50 MG/ML ORAL SOLUTION
125.0000 mg | Freq: Four times a day (QID) | ORAL | Status: DC
  Administered 2018-05-29: 125 mg via ORAL
  Filled 2018-05-29 (×3): qty 2.5

## 2018-05-29 MED ORDER — ACETAMINOPHEN 650 MG RE SUPP: 650.0000 mg | Freq: Four times a day (QID) | RECTAL | Status: DC | PRN

## 2018-05-29 MED ORDER — AMLODIPINE BESYLATE 10 MG PO TABS
10.0000 mg | ORAL_TABLET | Freq: Every day | ORAL | Status: DC
  Administered 2018-05-29 – 2018-06-01 (×4): 10 mg via ORAL
  Filled 2018-05-29 (×5): qty 1

## 2018-05-29 MED ORDER — FOLIC ACID 5 MG/ML IJ SOLN
1.0000 mg | Freq: Every day | INTRAMUSCULAR | Status: DC
  Administered 2018-05-29 – 2018-06-03 (×6): 1 mg via INTRAVENOUS
  Filled 2018-05-29 (×7): qty 0.2

## 2018-05-29 MED ORDER — CALCIUM CARBONATE-VITAMIN D 500-200 MG-UNIT PO TABS
1.0000 | ORAL_TABLET | Freq: Two times a day (BID) | ORAL | Status: DC
  Administered 2018-05-29 – 2018-06-17 (×39): 1 via ORAL
  Filled 2018-05-29 (×39): qty 1

## 2018-05-29 MED ORDER — VANCOMYCIN 50 MG/ML ORAL SOLUTION
125.0000 mg | Freq: Four times a day (QID) | ORAL | Status: AC
  Administered 2018-05-29 – 2018-06-08 (×39): 125 mg via ORAL
  Filled 2018-05-29 (×42): qty 2.5

## 2018-05-29 MED ORDER — SODIUM CHLORIDE 0.9% FLUSH
3.0000 mL | Freq: Two times a day (BID) | INTRAVENOUS | Status: DC
  Administered 2018-05-29 – 2018-06-17 (×30): 3 mL via INTRAVENOUS

## 2018-05-29 MED ORDER — VITAMIN B-1 100 MG PO TABS
100.0000 mg | ORAL_TABLET | Freq: Every day | ORAL | Status: DC
  Administered 2018-05-29 – 2018-06-17 (×20): 100 mg via ORAL
  Filled 2018-05-29 (×21): qty 1

## 2018-05-29 MED ORDER — PANTOPRAZOLE SODIUM 40 MG IV SOLR
40.0000 mg | Freq: Two times a day (BID) | INTRAVENOUS | Status: DC
  Administered 2018-05-29 – 2018-06-01 (×8): 40 mg via INTRAVENOUS
  Filled 2018-05-29 (×8): qty 40

## 2018-05-29 MED ORDER — ADULT MULTIVITAMIN W/MINERALS CH
1.0000 | ORAL_TABLET | Freq: Every day | ORAL | Status: DC
  Administered 2018-05-29 – 2018-06-17 (×20): 1 via ORAL
  Filled 2018-05-29 (×20): qty 1

## 2018-05-29 MED ORDER — IPRATROPIUM-ALBUTEROL 0.5-2.5 (3) MG/3ML IN SOLN: 3.0000 mL | Freq: Four times a day (QID) | RESPIRATORY_TRACT | Status: DC | PRN

## 2018-05-29 MED ORDER — ENSURE ENLIVE PO LIQD
237.0000 mL | Freq: Two times a day (BID) | ORAL | Status: DC
  Administered 2018-05-29 – 2018-06-01 (×6): 237 mL via ORAL

## 2018-05-29 MED ORDER — SODIUM CHLORIDE 0.9 % IV SOLN
INTRAVENOUS | Status: DC
  Administered 2018-05-29 – 2018-05-30 (×3): via INTRAVENOUS

## 2018-05-29 MED ORDER — FOLIC ACID 1 MG PO TABS: 1.0000 mg | ORAL_TABLET | Freq: Every day | ORAL | Status: DC

## 2018-05-29 MED ORDER — TRAZODONE HCL 50 MG PO TABS
50.0000 mg | ORAL_TABLET | Freq: Every evening | ORAL | Status: DC | PRN
  Administered 2018-05-29 – 2018-06-16 (×17): 50 mg via ORAL
  Filled 2018-05-29 (×18): qty 1

## 2018-05-29 MED ORDER — THIAMINE HCL 100 MG/ML IJ SOLN
100.0000 mg | Freq: Every day | INTRAMUSCULAR | Status: DC
  Filled 2018-05-29: qty 2

## 2018-05-29 MED ORDER — ONDANSETRON HCL 4 MG PO TABS
4.0000 mg | ORAL_TABLET | Freq: Four times a day (QID) | ORAL | Status: DC | PRN
  Administered 2018-06-01: 20:00:00 4 mg via ORAL
  Filled 2018-05-29: qty 1

## 2018-05-29 MED ORDER — TAMSULOSIN HCL 0.4 MG PO CAPS
0.4000 mg | ORAL_CAPSULE | Freq: Every day | ORAL | Status: DC
  Administered 2018-05-29 – 2018-06-17 (×20): 0.4 mg via ORAL
  Filled 2018-05-29 (×20): qty 1

## 2018-05-29 MED ORDER — ONDANSETRON HCL 4 MG/2ML IJ SOLN: 4.0000 mg | Freq: Four times a day (QID) | INTRAMUSCULAR | Status: DC | PRN

## 2018-05-29 NOTE — TOC Progression Note (Signed)
Transition of Care [facility]) - Progression Note    Patient Details  Name: RISHARD DELANGE MRN: [id number] Date of Birth: Apr 28, 1934  Transition of Care [facility] CM/SW Contact  Su Hilt, RN Phone Number: 05/29/2018, 4:22 PM  Clinical Narrative:    Left a detailed Voice mail for [name] the owner of [facility] at [phone], I requested her to call back to the floor and ask to speak to the Care Manager or the Social Worker to notify us if the patient will be able to return to [facility]    Expected Discharge Plan: Group Home([facility]) Barriers to Discharge: Continued Medical Work up  Expected Discharge Plan and Services Expected Discharge Plan: Group Home([facility])   Discharge Planning Services: CM Consult   Living arrangements for the past 2 months: Group Home                                       Social Determinants of Health (SDOH) Interventions    Readmission Risk Interventions Readmission Risk Prevention Plan 05/07/2018  Transportation Screening Complete  PCP or Specialist Appt within 3-5 Days Complete  HRI or Home Care Consult Complete  Medication Review (RN Care Manager) Complete  Some recent data might be hidden

## 2018-05-29 NOTE — H&P (Addendum)
[facility] [place] NAME: Justin Andrews    MR#:  [id number]  DATE OF BIRTH:  22-May-1934  DATE OF ADMISSION:  05/28/2018  PRIMARY CARE PHYSICIAN: Leone Haven, MD   REQUESTING/REFERRING PHYSICIAN: [name], MD  CHIEF COMPLAINT:   Chief Complaint  Patient presents with   Abdominal Pain   Post-op Problem    HISTORY OF PRESENT ILLNESS:  Justin Andrews  is a 84 y.o. male with a known history of hypertension, GERD, CHF, history of alcoholism.  He is status post exploratory laparoscopy with duodenoplasty with J-tube placement and JP drain on 05/08/2018.  Patient had originally been admitted for large GI bleed with perforated duodenal ulcer.  He was discharged on 05/26/18.  During the hospitalization, patient was treated for pneumonia and completed course of antibiotics.  He returned to the emergency room complaining of increased abdominal pain with the ball of having become disconnected from the JP drain which patient reported occurred 48 hours prior to his arrival.  Patient denies experiencing shortness of breath, chest pain, nausea, vomiting, diarrhea.  He has noted no fevers or chills.  Patient denies diarrhea.  He reports his last bowel movement was 3 days ago.  He denies hematemesis, hematochezia, or melena.  Patient reports his last alcoholic intake was about 1 month ago with 1 pint vodka at that time.  Rapid COVID-19 testing was completed in the emergency room and is negative.  General surgery was consulted, Dr.Jose Piscoya who reviewed the CT abdomen noting some bubbles of air around the liver dome however no large pneumoperitoneum seen.  There are postop changes with inflammation around the repair site however no colonic wall thickening.  Although there is evidence of free air on CT scan, this is likely explained by the fact that the patient's JP drain has not been connected to bulb suction allowing air to get into the abdomen through the  drain.  Recommendations to keep the patient n.p.o. precautionary and follow-up results of C. difficile testing.  Patient has been started on IV Protonix.  Troponin is 0.08 on arrival.  We will continue serial troponin.  No leukocytosis identified.  Hemoglobin is 10.0 with hematocrit 31.1.  He has been admitted to the hospitalist service for further management.    PAST MEDICAL HISTORY:   Past Medical History:  Diagnosis Date   C. difficile diarrhea 11/2017   Elevated serum creatinine    History of melanoma    Hypertension    Osteoarthritis    Tobacco abuse     PAST SURGICAL HISTORY:   Past Surgical History:  Procedure Laterality Date   ANKLE FRACTURE SURGERY     CENTRAL VENOUS CATHETER INSERTION  05/08/2018   Procedure: INSERTION CENTRAL LINE ADULT;  Surgeon: Olean Ree, MD;  Location: [facility];  Service: General;;   ESOPHAGOGASTRODUODENOSCOPY  05/08/2018   Procedure: ESOPHAGOGASTRODUODENOSCOPY (EGD);  Surgeon: Olean Ree, MD;  Location: [facility];  Service: General;;   ESOPHAGOGASTRODUODENOSCOPY (EGD) WITH PROPOFOL N/A 05/03/2018   Procedure: ESOPHAGOGASTRODUODENOSCOPY (EGD) WITH PROPOFOL;  Surgeon: Toledo, Benay Pike, MD;  Location: [facility];  Service: Gastroenterology;  Laterality: N/A;   EXTERNAL EAR SURGERY     INTRAMEDULLARY (IM) NAIL INTERTROCHANTERIC Left 12/01/2017   Procedure: INTRAMEDULLARY (IM) NAIL INTERTROCHANTRIC;  Surgeon: Thornton Park, MD;  Location: [facility];  Service: Orthopedics;  Laterality: Left;   JEJUNOSTOMY  05/08/2018   Procedure: Shanon Rosser;  Surgeon: Olean Ree, MD;  Location: [facility];  Service: General;;  LAPAROTOMY N/A 05/08/2018   Procedure: EXPLORATORY LAPAROTOMY;  Surgeon: Olean Ree, MD;  Location: [facility];  Service: General;  Laterality: N/A;   ROTATOR CUFF REPAIR     TONSILLECTOMY     TYMPANOPLASTY W/ MASTOIDECTOMY     Patient with reports of mastoid surgery (appears to have had surgery on TM; Left).    VISCERAL ARTERY INTERVENTION N/A 05/04/2018   Procedure: VISCERAL ARTERY INTERVENTION;  Surgeon: Algernon Huxley, MD;  Location: [facility];  Service: Cardiovascular;  Laterality: N/A;    SOCIAL HISTORY:   Social History   Tobacco Use   Smoking status: Current Every Day Smoker    Packs/day: 1.00    Years: 70.00    Pack years: 70.00    Types: Cigarettes   Smokeless tobacco: Never Used  Substance Use Topics   Alcohol use: No    FAMILY HISTORY:   Family History  Problem Relation Age of Onset   Prostate cancer Neg Hx    Bladder Cancer Neg Hx    Kidney cancer Neg Hx     DRUG ALLERGIES:   Allergies  Allergen Reactions   Aspirin Other (See Comments)    bleeding    REVIEW OF SYSTEMS:   Review of Systems  Constitutional: Positive for malaise/fatigue. Negative for chills, diaphoresis and fever.  HENT: Negative for congestion, sinus pain and sore throat.   Eyes: Negative for blurred vision and double vision.  Respiratory: Negative for cough, sputum production and shortness of breath.   Cardiovascular: Negative for chest pain and palpitations.  Gastrointestinal: Positive for abdominal pain. Negative for diarrhea, heartburn, nausea and vomiting.  Genitourinary: Negative for dysuria, flank pain and frequency.  Musculoskeletal: Negative for joint pain.  Skin: Negative for itching and rash.  Neurological: Positive for weakness. Negative for dizziness, focal weakness, loss of consciousness and headaches.  Psychiatric/Behavioral: Negative.       MEDICATIONS AT HOME:   Prior to Admission medications   Medication Sig Start Date End Date Taking? Authorizing Provider  amLODipine (NORVASC) 10 MG tablet Take 1 tablet (10 mg total) by mouth daily. 05/26/18 07/25/18 Yes Sainani, Belia Heman, MD  calcium-vitamin D (OSCAL WITH D) 500-200 MG-UNIT tablet Take 1 tablet by mouth 2 (two) times daily. 05/26/18  Yes Henreitta Leber, MD  carvedilol (COREG) 6.25 MG tablet Take 1 tablet  (6.25 mg total) by mouth 2 (two) times daily with a meal. 05/26/18 07/25/18 Yes Sainani, Belia Heman, MD  pantoprazole (PROTONIX) 40 MG tablet Take 1 tablet (40 mg total) by mouth 2 (two) times daily before a meal. 05/26/18 07/25/18 Yes Sainani, Belia Heman, MD  tamsulosin (FLOMAX) 0.4 MG CAPS capsule Take 1 capsule (0.4 mg total) by mouth daily. 05/26/18 07/25/18 Yes Sainani, Belia Heman, MD  traMADol (ULTRAM) 50 MG tablet Take 1 tablet (50 mg total) by mouth every 8 (eight) hours as needed. 05/26/18  Yes Henreitta Leber, MD  traZODone (DESYREL) 50 MG tablet Take 1 tablet (50 mg total) by mouth at bedtime as needed for up to 30 days for sleep. 05/26/18 06/25/18 Yes Sainani, Belia Heman, MD  feeding supplement, ENSURE ENLIVE, (ENSURE ENLIVE) LIQD Take 237 mLs by mouth 2 (two) times daily between meals. 12/03/17   Fritzi Mandes, MD      VITAL SIGNS:  Blood pressure 132/65, pulse 81, temperature 98.2 F (36.8 C), temperature source Oral, resp. rate 19, height 5\' 8"  (1.727 m), weight 49.9 kg, SpO2 98 %.  PHYSICAL EXAMINATION:  Physical Exam Constitutional:  General: He is not in acute distress.    Appearance: He is ill-appearing.  HENT:     Head: Normocephalic.     Mouth/Throat:     Mouth: Mucous membranes are moist.     Pharynx: Oropharynx is clear.  Eyes:     General: No scleral icterus.    Extraocular Movements: Extraocular movements intact.     Pupils: Pupils are equal, round, and reactive to light.  Cardiovascular:     Rate and Rhythm: Normal rate and regular rhythm.     Heart sounds: Normal heart sounds. No murmur. No friction rub. No gallop.   Pulmonary:     Effort: Pulmonary effort is normal.     Breath sounds: Normal breath sounds. No wheezing, rhonchi or rales.  Abdominal:     General: Abdomen is flat. Bowel sounds are normal.     Palpations: Abdomen is soft.     Tenderness: There is generalized abdominal tenderness (With right lower abdominal JP drain and PEG tube). There is no right CVA  tenderness or left CVA tenderness.     Hernia: No hernia is present.  Skin:    General: Skin is warm and dry.     Capillary Refill: Capillary refill takes less than 2 seconds.     Coloration: Skin is not jaundiced.     Findings: No erythema or rash.  Neurological:     General: No focal deficit present.     Mental Status: He is alert and oriented to person, place, and time.  Psychiatric:        Behavior: Behavior normal.       LABORATORY PANEL:   CBC Recent Labs  Lab 05/28/18 1654  WBC 10.2  HGB 10.0*  HCT 31.1*  PLT 431*   ------------------------------------------------------------------------------------------------------------------  Chemistries  Recent Labs  Lab 05/28/18 1654  NA 137  K 4.4  CL 106  CO2 23  GLUCOSE 154*  BUN 28*  CREATININE 2.32*  CALCIUM 8.3*  AST 15  ALT 10  ALKPHOS 93  BILITOT 0.1*   ------------------------------------------------------------------------------------------------------------------  Cardiac Enzymes No results for input(s): TROPONINI in the last 168 hours. ------------------------------------------------------------------------------------------------------------------  RADIOLOGY:  Ct Abdomen Pelvis Wo Contrast  Result Date: 05/28/2018 CLINICAL DATA:  Abdominal pain, recent endoscopy and surgery EXAM: CT ABDOMEN AND PELVIS WITHOUT CONTRAST TECHNIQUE: Multidetector CT imaging of the abdomen and pelvis was performed following the standard protocol without IV contrast. COMPARISON:  05/02/2018 CT, UGI 05/11/2018 FINDINGS: Lower chest: Lung bases demonstrate no acute consolidation or effusion. The heart size is within normal limits. Hepatobiliary: No focal liver abnormality is seen. No gallstones, gallbladder wall thickening, or biliary dilatation. Pancreas: Indistinct near the pancreatic head. Spleen: Normal in size without focal abnormality. Adrenals/Urinary Tract: Adrenal glands are normal. Nonspecific perinephric fat  stranding. Cysts in the left kidney. No hydronephrosis. Slightly thick-walled appearance of the urinary bladder. Stomach/Bowel: The stomach is nonenlarged. Gastroduodenal junction partially obscured by vascular coil artifact. Small foci of gas near the duodenal bulb. Indistinct edema here. Irregular gas at the second portion of duodenum consistent with diverticulum. No dilated small bowel. Interim placement of left abdominal jejunostomy tube. The tip is visible in the upper anterior pelvis bowel loop. Negative appendix. Vascular/Lymphatic: Moderate aortic atherosclerosis without aneurysm. No significantly enlarged lymph nodes. Reproductive: Slightly enlarged prostate gland Other: No significant free fluid. Small foci of pneumoperitoneum at the periphery of the liver. Surgical drain in the right upper quadrant with tip terminating near the pyloric region of the stomach. Ventral incision. No  subcutaneous fluid collections. Small fat containing right inguinal hernia. Soft tissue stranding within the central and right upper quadrant abdominal mesentery. Musculoskeletal: No acute or suspicious abnormality. IMPRESSION: 1. Scattered foci of pneumoperitoneum adjacent to the liver, presumably related to history of recent laparotomy and the presence of an intra-abdominal drainage catheter. Edema and indistinct appearance near the duodenal bulb and head of pancreas with small gas collections in the region which may be residual air from duodenal ulcer repair, however consider short interval CT follow-up to exclude small leak or increasing air in the region. Soft tissue stranding and edema within the central and right upper quadrant mesentery felt to reflect resolving operative change. 2. Additional gas and fluid collection near second portion of duodenum, felt to correspond to previously demonstrated diverticulum. 3. New left lower quadrant feeding jejunostomy tube 4. Slightly thick-walled appearance of the urinary bladder,  questionable for cystitis Electronically Signed   By: Donavan Foil M.D.   On: 05/28/2018 20:31      IMPRESSION AND PLAN:   1.  Abdominal pain - Likely secondary to recent exploratory laparoscopy with duodenal plasty, with JP bulb having become disconnected from the drain for a period of 2 days. - We are treating pain with analgesic - Patient is being held n.p.o. per Dr. Davis Gourd, with general surgery as a precautionary measure. -C. difficile is pending awaiting collection of stool. --IV Protonix twice daily - We will repeat CBC and BMP in the a.m.  2. Hypertension --Norvasc restarted - We will treat persistent hypertension expectantly  3.  History of EtOH abuse - CIWA protocol initiated  4.  GERD - IV PPI therapy initiated with Protonix  5.  History of pneumonia/recent - Will monitor closely with repeated chest x-ray in the a.m. -  CBC in the a.m. - DuoNeb every 6 hours as needed - Incentive spirometry  DVT and PPI prophylaxis were initiated     All the records are reviewed and case discussed with ED provider. The plan of care was discussed in details with the patient (and family). I answered all questions. The patient agreed to proceed with the above mentioned plan. Further management will depend upon hospital course.   CODE STATUS: Full code  TOTAL TIME TAKING CARE OF THIS PATIENT: 45 minutes.    [place] on 05/29/2018 at 4:33 AM  Pager - [phone]  After 6pm go to [email or web address] - [employer] [facility]  Office  [phone]  CC: Primary care physician; Leone Haven, MD    Attending physician admission note:  I have seen and examined the patient with Ms. Gardiner Barefoot, CRNP on 05/28/2018.  The patient presents with abdominal pain secondary to recent exploratory laparoscopy with duodenal plasty, with JP bulb having become disconnected from the drain for a period of 2 days.   Upon physical  examination:  Generally: Pleasant elderly Caucasian male in no acute distress Cardiovascular: Regular rate and rhythm with normal S1-S2 and no murmurs gallops or rubs. Respiratory: Clear to auscultation bilaterally Abdomen: Soft, with mild generalized tenderness without rebound tenderness guarding or rigidity.  Intact JP drain and G-tube.  With positive bowel sounds and no palpable organomegaly or masses. Extremities: No edema clubbing or cyanosis  Labs and radiographic studies: were all reviewed  Assessment/plan: The patient will be admitted to a surgical bed for further management of his abdominal pain.  General surgery will be following.  Pain management will be provided.  IV PPI will be given.  C. difficile is currently pending  For further details please refer to dictated admission H&P.  I have discussed the case with my nurse practitioner. I agree with the admission note and the rest of the plan of care as delineated by Ms. Gardiner Barefoot, CRNP.   Note: This dictation was prepared with Dragon dictation along with smaller phrase technology. Any transcriptional errors that result from this process are unintentional.

## 2018-05-29 NOTE — Progress Notes (Addendum)
Initial Nutrition Assessment  DOCUMENTATION CODES:   Underweight  INTERVENTION:   Ensure Enlive po BID, each supplement provides 350 kcal and 20 grams of protein  MVI, folic acid and thiamine in setting of etoh abuse  NUTRITION DIAGNOSIS:   Inadequate oral intake related to acute illness as evidenced by other (comment)(per chart review ).  GOAL:   Patient will meet greater than or equal to 90% of their needs  MONITOR:   Diet advancement, Labs, Weight trends, Skin, I & O's, TF tolerance  REASON FOR ASSESSMENT:   Malnutrition Screening Tool    ASSESSMENT:   84 y/o male with h/o HTN, OA, etoh abuse, CHF s/p exlap, repair of bleeding duodenal ulcer, duodenoplasty, and J tube placement on 5/1. Was discharged to group home on 5/19 and now returns with abdominal pain and diarrhea.    RD working remotely.  Pt not using J-tube. Pt initiated on clear liquid diet with Ensure today. Pt hungry and requesting to eat. Diarrhea improved. No acute findings on CT scan per MD note. Per chart, pt with 10lb(8%) wt loss over the past month; this is significant. Suspect poor oral intake at home. Recommend continue supplements and vitamins after discharge. Consider using J-tube for feeds if weight continues to decrease.   Pt at high risk for developing malnutrition but unable to diagnose at this time as NFPE cannot be performed.   Medications reviewed and include: oscal w/ D, folic acid, MVI, protonix, thiamine NaCl @ 56ml/hr  Labs reviewed: BUN 27(H), creat 2.12(H) Hgb 8.8(L), Hct 27.5(L)  Unable to complete Nutrition-Focused physical exam at this time.   Diet Order:   Diet Order            Diet NPO time specified  Diet effective now             EDUCATION NEEDS:   Not appropriate for education at this time  Skin:  Skin Assessment: Reviewed RN Assessment(incision abdomen )  Last BM:  5/21- type 7   Height:   Ht Readings from Last 1 Encounters:  05/29/18 5\' 8"  (1.727 m)     Weight:   Wt Readings from Last 1 Encounters:  05/29/18 54.9 kg    Ideal Body Weight:  70 kg  BMI:  Body mass index is 18.4 kg/m.  Estimated Nutritional Needs:   Kcal:  1600-1800kcal/day   Protein:  80-90g/day   Fluid:  >1.4L/day   Koleen Distance MS, RD, LDN Pager #- [phone] Office#- [phone] After Hours Pager: [phone]

## 2018-05-29 NOTE — TOC Initial Note (Signed)
Transition of Care [facility]) - Initial/Assessment Note    Patient Details  Name: Justin Andrews MRN: [id number] Date of Birth: [birth year]  [facility] of [facility]) CM/SW Contact:    Su Hilt, RN Phone Number: 05/29/2018, 4:07 PM  Clinical Narrative:                   Expected Discharge Plan: Group Home([facility]) Barriers to Discharge: Continued Medical Work up   Patient Goals and CMS Choice  Unable to reach patient to discuss DC plan and needs, he is on Isolation precaution and will not answer the phone, gathered information from previous recent visit from the chart He discharged on 05/26/18 to [facility] group home, he returned for abdominal pain but the bedside nurse states that he is not having any abdominal pain.  I attempted to reach [name] the owner of [facility] at [phone], unable to reach, she does not answer the phone, I did speak with Izora Gala at [phone], Izora Gala stated that she can't make the decision if the patient can return to the group home at [place] or Not that [name] the owner would have to decide. The patient was very reluctant to go to the group home at DC from the previous admission, His Son Serano [phone]) talked him into it.  Will continue to Monitor for needs        Expected Discharge Plan and Services Expected Discharge Plan: Group Home([facility])   Discharge Planning Services: CM Consult   Living arrangements for the past 2 months: Group Home                                      Prior Living Arrangements/Services Living arrangements for the past 2 months: Group Home Lives with:: Facility Resident Patient language and need for interpreter reviewed:: No        Need for Family Participation in Patient Care: No (Comment) Care giver support system in place?: No (comment)   Criminal Activity/Legal Involvement Pertinent to Current Situation/Hospitalization: No - Comment as  needed  Activities of Daily Living Home Assistive Devices/Equipment: Walker (specify type) ADL Screening (condition at time of admission) Patient's cognitive ability adequate to safely complete daily activities?: No Is the patient deaf or have difficulty hearing?: Yes Does the patient have difficulty seeing, even when wearing glasses/contacts?: Yes Does the patient have difficulty concentrating, remembering, or making decisions?: Yes Patient able to express need for assistance with ADLs?: Yes Does the patient have difficulty dressing or bathing?: Yes Independently performs ADLs?: No Communication: Independent Dressing (OT): Needs assistance Is this a change from baseline?: Pre-admission baseline Grooming: Needs assistance Is this a change from baseline?: Pre-admission baseline Feeding: Independent Bathing: Needs assistance Is this a change from baseline?: Pre-admission baseline Toileting: Needs assistance Is this a change from baseline?: Pre-admission baseline In/Out Bed: Needs assistance Is this a change from baseline?: Pre-admission baseline Walks in Home: Needs assistance Is this a change from baseline?: Pre-admission baseline Does the patient have difficulty walking or climbing stairs?: Yes Weakness of Legs: Both Weakness of Arms/Hands: Both  Permission Sought/Granted                  Emotional Assessment Appearance:: Appears stated age Attitude/Demeanor/Rapport: Unable to Assess Affect (typically observed): Unable to Assess   Alcohol / Substance Use: Tobacco Use, Alcohol Use Psych Involvement:  No (comment)  Admission diagnosis:  Dehydration [E86.0] Generalized abdominal pain [R10.84] Diarrhea, unspecified type [R19.7] Abdominal pain [R10.9] Patient Active Problem List   Diagnosis Date Noted  . Abdominal pain 05/28/2018  . GI bleed 05/02/2018  . Hemorrhagic shock ([place]) 05/02/2018  . Altered mental status 05/02/2018  . Anemia due to blood loss, acute  05/02/2018  . RLS (restless legs syndrome) 04/22/2018  . Wrist pain 04/22/2018  . Right hip pain 04/08/2018  . Otitis media 03/19/2018  . Suicidal ideation 03/19/2018  . Protein-calorie malnutrition, severe 12/03/2017  . Closed left hip fracture ([place]) 11/30/2017  . Diarrhea 11/15/2017  . Duodenum disorder 11/15/2017  . Frequency of urination 10/21/2017  . Cigarette smoker 10/06/2017  . Cough 09/25/2017  . Changes in vision 09/25/2017  . Proteinuria 09/25/2017  . Basal cell carcinoma 02/28/2017  . 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 DOE (dyspnea on exertion) 01/17/2017  . Chronic right upper quadrant pain 11/15/2016  . Balance problem 11/15/2016  . Simple renal cyst 10/21/2016  . Prostate nodule 10/16/2016  . Dysuria 10/16/2016  . CKD (chronic kidney disease) stage 3, GFR 30-59 ml/min (HCC) 08/05/2016  . Anemia 08/05/2016  . Anxiety and depression 08/05/2016  . Weight loss 08/05/2016  . Sleep disturbance 07/23/2016  . Hypertension   . Osteoarthritis   . Tobacco abuse   . 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), [place] - [place]) [place] [address] Phone: [phone] Fax: [phone]     Social Determinants of Health (SDOH) Interventions    Readmission Risk Interventions Readmission Risk Prevention Plan 05/07/2018  Transportation Screening Complete  PCP or Specialist Appt within 3-5 Days Complete  HRI or Home Care Consult Complete  Medication Review (RN Care Manager) Complete  Some recent data might be hidden

## 2018-05-29 NOTE — Progress Notes (Addendum)
[facility] NAME: Justin Andrews    MR#:  [id number]  DATE OF BIRTH:  [birth year]  SUBJECTIVE:  CHIEF COMPLAINT:   Chief Complaint  Patient presents with  . Abdominal Pain  . Post-op Problem   Minimal abdominal pain today.  Diarrhea has resolved.  Afebrile.  Wants to eat.  REVIEW OF SYSTEMS:    Review of Systems  Constitutional: Negative for chills and fever.  HENT: Negative for sore throat.   Eyes: Negative for blurred vision, double vision and pain.  Respiratory: Negative for cough, hemoptysis, shortness of breath and wheezing.   Cardiovascular: Negative for chest pain, palpitations, orthopnea and leg swelling.  Gastrointestinal: Positive for abdominal pain. Negative for constipation, diarrhea, heartburn, nausea and vomiting.  Genitourinary: Negative for dysuria and hematuria.  Musculoskeletal: Negative for back pain and joint pain.  Skin: Negative for rash.  Neurological: Negative for sensory change, speech change, focal weakness and headaches.  Endo/Heme/Allergies: Does not bruise/bleed easily.  Psychiatric/Behavioral: Negative for depression. The patient is not nervous/anxious.     DRUG ALLERGIES:   Allergies  Allergen Reactions  . Aspirin Other (See Comments)    bleeding    VITALS:  Blood pressure 120/60, pulse 83, temperature 98.2 F (36.8 C), resp. rate 20, height 5\' 8"  (1.727 m), weight 54.9 kg, SpO2 99 %.  PHYSICAL EXAMINATION:   Physical Exam  GENERAL:  84 y.o.-year-old patient lying in the bed with no acute distress.  EYES: Pupils equal, round, reactive to light and accommodation. No scleral icterus. Extraocular muscles intact.  HEENT: Head atraumatic, normocephalic. Oropharynx and nasopharynx clear.  NECK:  Supple, no jugular venous distention. No thyroid enlargement, no tenderness.  LUNGS: Normal breath sounds bilaterally, no wheezing, rales, rhonchi. No use of accessory muscles of respiration.   CARDIOVASCULAR: S1, S2 normal. No murmurs, rubs, or gallops.  ABDOMEN: Soft, mild tenderness at surgical site, nondistended. Bowel sounds present. No organomegaly or mass.  EXTREMITIES: No cyanosis, clubbing or edema b/l.    NEUROLOGIC: Cranial nerves II through XII are intact. No focal Motor or sensory deficits b/l.   PSYCHIATRIC: The patient is alert and oriented x 3.  SKIN: No obvious rash, lesion, or ulcer.   LABORATORY PANEL:   CBC Recent Labs  Lab 05/29/18 0434  WBC 8.0  HGB 8.8*  HCT 27.5*  PLT 373   ------------------------------------------------------------------------------------------------------------------ Chemistries  Recent Labs  Lab 05/28/18 1654 05/29/18 0434  NA 137 139  K 4.4 3.5  CL 106 110  CO2 23 22  GLUCOSE 154* 109*  BUN 28* 27*  CREATININE 2.32* 2.12*  CALCIUM 8.3* 8.0*  AST 15  --   ALT 10  --   ALKPHOS 93  --   BILITOT 0.1*  --    ------------------------------------------------------------------------------------------------------------------  Cardiac Enzymes Recent Labs  Lab 05/29/18 0454  TROPONINI <0.03   ------------------------------------------------------------------------------------------------------------------  RADIOLOGY:  Ct Abdomen Pelvis Wo Contrast  Result Date: 05/28/2018 CLINICAL DATA:  Abdominal pain, recent endoscopy and surgery EXAM: CT ABDOMEN AND PELVIS WITHOUT CONTRAST TECHNIQUE: Multidetector CT imaging of the abdomen and pelvis was performed following the standard protocol without IV contrast. COMPARISON:  05/02/2018 CT, UGI 05/11/2018 FINDINGS: Lower chest: Lung bases demonstrate no acute consolidation or effusion. The heart size is within normal limits. Hepatobiliary: No focal liver abnormality is seen. No gallstones, gallbladder wall thickening, or biliary dilatation. Pancreas: Indistinct near the pancreatic head. Spleen: Normal in size without focal abnormality. Adrenals/Urinary Tract: Adrenal glands are  normal. Nonspecific  perinephric fat stranding. Cysts in the left kidney. No hydronephrosis. Slightly thick-walled appearance of the urinary bladder. Stomach/Bowel: The stomach is nonenlarged. Gastroduodenal junction partially obscured by vascular coil artifact. Small foci of gas near the duodenal bulb. Indistinct edema here. Irregular gas at the second portion of duodenum consistent with diverticulum. No dilated small bowel. Interim placement of left abdominal jejunostomy tube. The tip is visible in the upper anterior pelvis bowel loop. Negative appendix. Vascular/Lymphatic: Moderate aortic atherosclerosis without aneurysm. No significantly enlarged lymph nodes. Reproductive: Slightly enlarged prostate gland Other: No significant free fluid. Small foci of pneumoperitoneum at the periphery of the liver. Surgical drain in the right upper quadrant with tip terminating near the pyloric region of the stomach. Ventral incision. No subcutaneous fluid collections. Small fat containing right inguinal hernia. Soft tissue stranding within the central and right upper quadrant abdominal mesentery. Musculoskeletal: No acute or suspicious abnormality. IMPRESSION: 1. Scattered foci of pneumoperitoneum adjacent to the liver, presumably related to history of recent laparotomy and the presence of an intra-abdominal drainage catheter. Edema and indistinct appearance near the duodenal bulb and head of pancreas with small gas collections in the region which may be residual air from duodenal ulcer repair, however consider short interval CT follow-up to exclude small leak or increasing air in the region. Soft tissue stranding and edema within the central and right upper quadrant mesentery felt to reflect resolving operative change. 2. Additional gas and fluid collection near second portion of duodenum, felt to correspond to previously demonstrated diverticulum. 3. New left lower quadrant feeding jejunostomy tube 4. Slightly thick-walled  appearance of the urinary bladder, questionable for cystitis Electronically Signed   By: Donavan Foil M.D.   On: 05/28/2018 20:31   Dg Chest Port 1 View  Result Date: 05/29/2018 CLINICAL DATA:  Dyspnea EXAM: PORTABLE CHEST 1 VIEW COMPARISON:  Three days ago FINDINGS: Normal heart size. Stable aortic tortuosity. Large lung volumes with chronic interstitial coarsening. Remote bilateral rib fractures. IMPRESSION: No evidence of acute disease. Electronically Signed   By: Monte Fantasia M.D.   On: 05/29/2018 05:32     ASSESSMENT AND PLAN:   1.  Abdominal pain Has had this pain since his recent surgery.  Will start Tylenol and tramadol as needed.  No acute findings on CT scan.  Discussed with Dr. Perrin Maltese of surgery.  Will start clear liquids.  2. Hypertension --Norvasc restarted - Monitor  3.  History of EtOH abuse - CIWA protocol   4.  GERD - Protonix  5.  History of pneumonia/recent - Will monitor closely with repeated chest x-ray in the a.m. -  CBC in the a.m. - DuoNeb every 6 hours as needed - Incentive spirometry  6.  Acute kidney injury over CKD stage III is improving.  IV fluids.  Monitor input and output.  Repeat labs in the morning.  DVT and PPI prophylaxis were initiated  All the records are reviewed and case discussed with Care Management/Social Worker Management plans discussed with the patient, family and they are in agreement.  CODE STATUS: DNR/DNI  TOTAL TIME TAKING CARE OF THIS PATIENT: 35 minutes.   POSSIBLE D/C IN 1-2 DAYS, DEPENDING ON CLINICAL CONDITION.  Leia Alf Hershal Eriksson M.D on 05/29/2018 at 12:51 PM  Between 7am to 6pm - Pager - [phone]  After 6pm go to [email or web address] - password [email or web address] [facility]  Office  [phone]  CC: Primary care physician; Leone Haven, MD  Note: This dictation was prepared with Dragon dictation  along with smaller phrase technology. Any transcriptional errors that result from this process  are unintentional.

## 2018-05-29 NOTE — Progress Notes (Signed)
Attempted to perform IS education, pt refused stated "I am not going to use that".

## 2018-05-29 NOTE — Telephone Encounter (Signed)
Called and spoke with pt's son. Son advised and understands. Will wait for PCP to return.

## 2018-05-29 NOTE — TOC Initial Note (Signed)
Transition of Care [facility]) - Initial/Assessment Note    Patient Details  Name: Justin Andrews MRN: [id number] Date of Birth: [birth year]  [facility] CM/SW Contact:    Su Hilt, RN Phone Number: 05/29/2018, 4:16 PM  Clinical Narrative:                 Spoke to the patient about DC plan and needs He stated that he came back to the hospital due to being constipated, he stated that he is now able to go to the bathroom and his stomach feels mushy but not bad,  He stated that he is willing to go back to [facility], He stated that he does not have a need for [facility] services and does not need or want any DME he" would not use it anyways" Will try to reach [place] at [phone] at  [facility] to see if they are willing to accept the patient back at DC   Expected Discharge Plan: Group Home([facility]) Barriers to Discharge: Continued Medical Work up   Patient Goals and CMS Choice        Expected Discharge Plan and Services Expected Discharge Plan: Group Home([facility])   Discharge Planning Services: CM Consult   Living arrangements for the past 2 months: Group Home                                      Prior Living Arrangements/Services Living arrangements for the past 2 months: Group Home Lives with:: Facility Resident Patient language and need for interpreter reviewed:: No        Need for Family Participation in Patient Care: No (Comment) Care giver support system in place?: No (comment)   Criminal Activity/Legal Involvement Pertinent to Current Situation/Hospitalization: No - Comment as needed  Activities of Daily Living Home Assistive Devices/Equipment: Gilford Rile (specify type) ADL Screening (condition at time of admission) Patient's cognitive ability adequate to safely complete daily activities?: No Is the patient deaf or have difficulty hearing?: Yes Does the patient have difficulty seeing,  even when wearing glasses/contacts?: Yes Does the patient have difficulty concentrating, remembering, or making decisions?: Yes Patient able to express need for assistance with ADLs?: Yes Does the patient have difficulty dressing or bathing?: Yes Independently performs ADLs?: No Communication: Independent Dressing (OT): Needs assistance Is this a change from baseline?: Pre-admission baseline Grooming: Needs assistance Is this a change from baseline?: Pre-admission baseline Feeding: Independent Bathing: Needs assistance Is this a change from baseline?: Pre-admission baseline Toileting: Needs assistance Is this a change from baseline?: Pre-admission baseline In/Out Bed: Needs assistance Is this a change from baseline?: Pre-admission baseline Walks in Home: Needs assistance Is this a change from baseline?: Pre-admission baseline Does the patient have difficulty walking or climbing stairs?: Yes Weakness of Legs: Both Weakness of Arms/Hands: Both  Permission Sought/Granted                  Emotional Assessment Appearance:: Appears stated age Attitude/Demeanor/Rapport: Unable to Assess Affect (typically observed): Unable to Assess   Alcohol / Substance Use: Tobacco Use, Alcohol Use Psych Involvement: No (comment)  Admission diagnosis:  Dehydration [E86.0] Generalized abdominal pain [R10.84] Diarrhea, unspecified type [R19.7] Abdominal pain [R10.9] Patient Active Problem List   Diagnosis Date Noted  . Abdominal pain 05/28/2018  . GI bleed 05/02/2018  . Hemorrhagic  shock ([place]) 05/02/2018  . Altered mental status 05/02/2018  . Anemia due to blood loss, acute 05/02/2018  . RLS (restless legs syndrome) 04/22/2018  . Wrist pain 04/22/2018  . Right hip pain 04/08/2018  . Otitis media 03/19/2018  . Suicidal ideation 03/19/2018  . Protein-calorie malnutrition, severe 12/03/2017  . Closed left hip fracture ([place]) 11/30/2017  . Diarrhea 11/15/2017  . Duodenum disorder  11/15/2017  . Frequency of urination 10/21/2017  . Cigarette smoker 10/06/2017  . Cough 09/25/2017  . Changes in vision 09/25/2017  . Proteinuria 09/25/2017  . Basal cell carcinoma 02/28/2017  . 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 DOE (dyspnea on exertion) 01/17/2017  . Chronic right upper quadrant pain 11/15/2016  . Balance problem 11/15/2016  . Simple renal cyst 10/21/2016  . Prostate nodule 10/16/2016  . Dysuria 10/16/2016  . CKD (chronic kidney disease) stage 3, GFR 30-59 ml/min (HCC) 08/05/2016  . Anemia 08/05/2016  . Anxiety and depression 08/05/2016  . Weight loss 08/05/2016  . Sleep disturbance 07/23/2016  . Hypertension   . Osteoarthritis   . Tobacco abuse   . 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 [place] [facility] [place] [address] Phone: [phone] Fax: [phone]     Social Determinants of Health (SDOH) Interventions    Readmission Risk Interventions Readmission Risk Prevention Plan 05/07/2018  Transportation Screening Complete  PCP or Specialist Appt within 3-5 Days Complete  HRI or Home Care Consult Complete  Medication Review (RN Care Manager) Complete  Some recent data might be hidden

## 2018-05-29 NOTE — Telephone Encounter (Signed)
Call pt's son  Before giving any info in regards to patient, please confirm that we can speak with son. I see DPR for Justin Andrews, this is who you are speaking with, correct?  See chart review, patient appears to be admitted to hospital at this time as of yesterday. He had been dced on 05/26/18 and looks like re-admitted for abdominal pain.   Ask if this is helpful to son.   In regards to caring for his father, that is very complicated and I have sympathy for him as well as patient.  Does he feel comfortable speaking with PCP in regards to this after his dad is discharged?

## 2018-05-29 NOTE — ED Notes (Signed)
ED TO INPATIENT HANDOFF REPORT  ED Nurse Name and Phone #:  Kyair Ditommaso [phone]  S Name/Age/Gender Justin Andrews 84 y.o. male Room/Bed: ED04A/ED04A  Code Status   Code Status: DNR  Home/SNF/Other Home Patient oriented to: self, place and time Is this baseline? Yes   Triage Complete: Triage complete  Chief Complaint post surgical problem  Triage Note Patient reports having endoscopy and exploratory lap done this week due to GI bleed. States he was discharged on 5/19 and since has had worsening pain to incision sites. Patient also reports generalized fatigue and states "everything just runs right through me. I can't keep nothing in". Patient denies N/V or fever.    Allergies Allergies  Allergen Reactions  . Aspirin Other (See Comments)    bleeding    Level of Care/Admitting Diagnosis ED Disposition    ED Disposition Condition [facility] Area: [place] [100120]  Level of Care: Med-Surg [16]  Covid Evaluation: N/A  Diagnosis: Abdominal pain [496759]  Admitting Physician: Christel Mormon [[id number]]  Attending Physician: Christel Mormon [[id number]]  Estimated length of stay: past midnight tomorrow  Certification:: I certify this patient will need inpatient services for at least 2 midnights  PT Class (Do Not Modify): Inpatient [101]  PT Acc Code (Do Not Modify): Private [1]       B Medical/Surgery History Past Medical History:  Diagnosis Date  . C. difficile diarrhea 11/2017  . Elevated serum creatinine   . History of melanoma   . Hypertension   . Osteoarthritis   . Tobacco abuse    Past Surgical History:  Procedure Laterality Date  . ANKLE FRACTURE SURGERY    . CENTRAL VENOUS CATHETER INSERTION  05/08/2018   Procedure: INSERTION CENTRAL LINE ADULT;  Surgeon: Olean Ree, MD;  Location: [facility];  Service: General;;  . ESOPHAGOGASTRODUODENOSCOPY  05/08/2018   Procedure: ESOPHAGOGASTRODUODENOSCOPY (EGD);  Surgeon: Olean Ree, MD;   Location: [facility];  Service: General;;  . ESOPHAGOGASTRODUODENOSCOPY (EGD) WITH PROPOFOL N/A 05/03/2018   Procedure: ESOPHAGOGASTRODUODENOSCOPY (EGD) WITH PROPOFOL;  Surgeon: Toledo, Benay Pike, MD;  Location: [facility];  Service: Gastroenterology;  Laterality: N/A;  . EXTERNAL EAR SURGERY    . INTRAMEDULLARY (IM) NAIL INTERTROCHANTERIC Left 12/01/2017   Procedure: INTRAMEDULLARY (IM) NAIL INTERTROCHANTRIC;  Surgeon: Thornton Park, MD;  Location: [facility];  Service: Orthopedics;  Laterality: Left;  . JEJUNOSTOMY  05/08/2018   Procedure: JEJUNOSTOMY;  Surgeon: Olean Ree, MD;  Location: [facility];  Service: General;;  . LAPAROTOMY N/A 05/08/2018   Procedure: EXPLORATORY LAPAROTOMY;  Surgeon: Olean Ree, MD;  Location: [facility];  Service: General;  Laterality: N/A;  . ROTATOR CUFF REPAIR    . TONSILLECTOMY    . TYMPANOPLASTY W/ MASTOIDECTOMY     Patient with reports of mastoid surgery (appears to have had surgery on TM; Left).  . VISCERAL ARTERY INTERVENTION N/A 05/04/2018   Procedure: VISCERAL ARTERY INTERVENTION;  Surgeon: Algernon Huxley, MD;  Location: [facility];  Service: Cardiovascular;  Laterality: N/A;     A IV Location/Drains/Wounds Patient Lines/Drains/Airways Status   Active Line/Drains/Airways    Name:   Placement date:   Placement time:   Site:   Days:   Peripheral IV 05/28/18 Left Antecubital   05/28/18    1846    Antecubital   1   Closed System Drain 1 Right Abdomen Bulb (JP) 19 Fr.   05/08/18    1605    Abdomen   21  Gastrostomy/Enterostomy Jejunostomy 20 Fr. LLQ   05/08/18    1617    LLQ   21   Incision (Closed) 05/08/18 Abdomen   05/08/18    1640     21          Intake/Output Last 24 hours No intake or output data in the 24 hours ending 05/29/18 0045  Labs/Imaging Results for orders placed or performed during the hospital encounter of 05/28/18 (from the past 48 hour(s))  Lipase, blood     Status: None   Collection Time: 05/28/18  4:54 PM  Result  Value Ref Range   Lipase 42 11 - 51 U/L    Comment: Performed at [facility], [address] [place], [place] [address]  Comprehensive metabolic panel     Status: Abnormal   Collection Time: 05/28/18  4:54 PM  Result Value Ref Range   Sodium 137 135 - 145 mmol/L   Potassium 4.4 3.5 - 5.1 mmol/L   Chloride 106 98 - 111 mmol/L   CO2 23 22 - 32 mmol/L   Glucose, Bld 154 (H) 70 - 99 mg/dL   BUN 28 (H) 8 - 23 mg/dL   Creatinine, Ser 2.32 (H) 0.61 - 1.24 mg/dL   Calcium 8.3 (L) 8.9 - 10.3 mg/dL   Total Protein 6.3 (L) 6.5 - 8.1 g/dL   Albumin 2.4 (L) 3.5 - 5.0 g/dL   AST 15 15 - 41 U/L   ALT 10 0 - 44 U/L   Alkaline Phosphatase 93 38 - 126 U/L   Total Bilirubin 0.1 (L) 0.3 - 1.2 mg/dL   GFR calc non Af Amer 25 (L) >60 mL/min   GFR calc Af Amer 29 (L) >60 mL/min   Anion gap 8 5 - 15    Comment: Performed at [facility], [address] [place], [place] [address]  CBC     Status: Abnormal   Collection Time: 05/28/18  4:54 PM  Result Value Ref Range   WBC 10.2 4.0 - 10.5 K/uL   RBC 3.41 (L) 4.22 - 5.81 MIL/uL   Hemoglobin 10.0 (L) 13.0 - 17.0 g/dL   HCT 31.1 (L) 39.0 - 52.0 %   MCV 91.2 80.0 - 100.0 fL   MCH 29.3 26.0 - 34.0 pg   MCHC 32.2 30.0 - 36.0 g/dL   RDW 14.6 11.5 - 15.5 %   Platelets 431 (H) 150 - 400 K/uL   nRBC 0.0 0.0 - 0.2 %    Comment: Performed at [facility], [address] [place], [place] [address]  Urinalysis, Complete w Microscopic     Status: Abnormal   Collection Time: 05/28/18  4:54 PM  Result Value Ref Range   Color, Urine YELLOW (A) YELLOW   APPearance HAZY (A) CLEAR   Specific Gravity, Urine 1.016 1.005 - 1.030   pH 5.0 5.0 - 8.0   Glucose, UA NEGATIVE NEGATIVE mg/dL   Hgb urine dipstick NEGATIVE NEGATIVE   Bilirubin Urine NEGATIVE NEGATIVE   Ketones, ur NEGATIVE NEGATIVE mg/dL   Protein, ur 30 (A) NEGATIVE mg/dL   Nitrite NEGATIVE NEGATIVE   Leukocytes,Ua TRACE (A) NEGATIVE   RBC / HPF 0-5 0 - 5 RBC/hpf   WBC,  UA 0-5 0 - 5 WBC/hpf   Bacteria, UA RARE (A) NONE SEEN   Squamous Epithelial / LPF 0-5 0 - 5   Mucus PRESENT    Hyaline Casts, UA PRESENT     Comment: Performed at [facility], [address] [place], [place] [address]  SARS Coronavirus 2 (CEPHEID - Performed in [place] hospital lab), Hosp Order     Status: None   Collection Time: 05/28/18  7:23 PM  Result Value Ref Range   SARS Coronavirus 2 NEGATIVE NEGATIVE    Comment: (NOTE) If result is NEGATIVE SARS-CoV-2 target nucleic acids are NOT DETECTED. The SARS-CoV-2 RNA is generally detectable in upper and lower  respiratory specimens during the acute phase of infection. The lowest  concentration of SARS-CoV-2 viral copies this assay can detect is 250  copies / mL. A negative result does not preclude SARS-CoV-2 infection  and should not be used as the sole basis for treatment or other  patient management decisions.  A negative result may occur with  improper specimen collection / handling, submission of specimen other  than nasopharyngeal swab, presence of viral mutation(s) within the  areas targeted by this assay, and inadequate number of viral copies  (<250 copies / mL). A negative result must be combined with clinical  observations, patient history, and epidemiological information. If result is POSITIVE SARS-CoV-2 target nucleic acids are DETECTED. The SARS-CoV-2 RNA is generally detectable in upper and lower  respiratory specimens dur ing the acute phase of infection.  Positive  results are indicative of active infection with SARS-CoV-2.  Clinical  correlation with patient history and other diagnostic information is  necessary to determine patient infection status.  Positive results do  not rule out bacterial infection or co-infection with other viruses. If result is PRESUMPTIVE POSTIVE SARS-CoV-2 nucleic acids MAY BE PRESENT.   A presumptive positive result was obtained on the submitted specimen  and confirmed  on repeat testing.  While 2019 novel coronavirus  (SARS-CoV-2) nucleic acids may be present in the submitted sample  additional confirmatory testing may be necessary for epidemiological  and / or clinical management purposes  to differentiate between  SARS-CoV-2 and other Sarbecovirus currently known to infect humans.  If clinically indicated additional testing with an alternate test  methodology [phone]) is advised. The SARS-CoV-2 RNA is generally  detectable in upper and lower respiratory sp ecimens during the acute  phase of infection. The expected result is Negative. Fact Sheet for Patients:  StrictlyIdeas.no Fact Sheet for Healthcare Providers: BankingDealers.co.za This test is not yet approved or cleared by the [place] FDA and has been authorized for detection and/or diagnosis of SARS-CoV-2 by FDA under an Emergency Use Authorization (EUA).  This EUA will remain in effect (meaning this test can be used) for the duration of the COVID-19 declaration under Section 564(b)(1) of the Act, 21 U.S.C. section 360bbb-3(b)(1), unless the authorization is terminated or revoked sooner. Performed at [facility], [place], [place] [address]    Ct Abdomen Pelvis Wo Contrast  Result Date: 05/28/2018 CLINICAL DATA:  Abdominal pain, recent endoscopy and surgery EXAM: CT ABDOMEN AND PELVIS WITHOUT CONTRAST TECHNIQUE: Multidetector CT imaging of the abdomen and pelvis was performed following the standard protocol without IV contrast. COMPARISON:  05/02/2018 CT, UGI 05/11/2018 FINDINGS: Lower chest: Lung bases demonstrate no acute consolidation or effusion. The heart size is within normal limits. Hepatobiliary: No focal liver abnormality is seen. No gallstones, gallbladder wall thickening, or biliary dilatation. Pancreas: Indistinct near the pancreatic head. Spleen: Normal in size without focal abnormality. Adrenals/Urinary  Tract: Adrenal glands are normal. Nonspecific perinephric fat stranding. Cysts in the left kidney. No hydronephrosis. Slightly thick-walled appearance of the urinary bladder. Stomach/Bowel: The stomach is nonenlarged. Gastroduodenal junction partially  obscured by vascular coil artifact. Small foci of gas near the duodenal bulb. Indistinct edema here. Irregular gas at the second portion of duodenum consistent with diverticulum. No dilated small bowel. Interim placement of left abdominal jejunostomy tube. The tip is visible in the upper anterior pelvis bowel loop. Negative appendix. Vascular/Lymphatic: Moderate aortic atherosclerosis without aneurysm. No significantly enlarged lymph nodes. Reproductive: Slightly enlarged prostate gland Other: No significant free fluid. Small foci of pneumoperitoneum at the periphery of the liver. Surgical drain in the right upper quadrant with tip terminating near the pyloric region of the stomach. Ventral incision. No subcutaneous fluid collections. Small fat containing right inguinal hernia. Soft tissue stranding within the central and right upper quadrant abdominal mesentery. Musculoskeletal: No acute or suspicious abnormality. IMPRESSION: 1. Scattered foci of pneumoperitoneum adjacent to the liver, presumably related to history of recent laparotomy and the presence of an intra-abdominal drainage catheter. Edema and indistinct appearance near the duodenal bulb and head of pancreas with small gas collections in the region which may be residual air from duodenal ulcer repair, however consider short interval CT follow-up to exclude small leak or increasing air in the region. Soft tissue stranding and edema within the central and right upper quadrant mesentery felt to reflect resolving operative change. 2. Additional gas and fluid collection near second portion of duodenum, felt to correspond to previously demonstrated diverticulum. 3. New left lower quadrant feeding jejunostomy tube  4. Slightly thick-walled appearance of the urinary bladder, questionable for cystitis Electronically Signed   By: Donavan Foil M.D.   On: 05/28/2018 20:31    Pending Labs Unresulted Labs (From admission, onward)    Start     Ordered   05/29/18 5631  Basic metabolic panel  Tomorrow morning,   STAT     05/29/18 0015   05/29/18 0500  CBC  Tomorrow morning,   STAT     05/29/18 0015   05/29/18 0013  C difficile quick scan w PCR reflex  (C Difficile quick screen w PCR reflex panel)  Once, for 24 hours,   STAT     05/29/18 0015   05/28/18 2000  C Difficile Quick Screen w PCR reflex  Once,   R     05/28/18 2000          Vitals/Pain Today's Vitals   05/28/18 1828 05/28/18 1830 05/28/18 2000 05/28/18 2030  BP: (!) 129/59 (!) 130/58 130/77 122/64  Pulse: 79 77 80 83  Resp: 18     Temp:      TempSrc:      SpO2: 100% 99% 98% 99%  Weight:      Height:      PainSc:        Isolation Precautions Enteric precautions (UV disinfection)  Medications Medications  sodium chloride flush (NS) 0.9 % injection 3 mL (has no administration in time range)  0.9 %  sodium chloride infusion (has no administration in time range)  acetaminophen (TYLENOL) tablet 650 mg (has no administration in time range)    Or  acetaminophen (TYLENOL) suppository 650 mg (has no administration in time range)  polyethylene glycol (MIRALAX / GLYCOLAX) packet 17 g (has no administration in time range)  ondansetron (ZOFRAN) tablet 4 mg (has no administration in time range)    Or  ondansetron (ZOFRAN) injection 4 mg (has no administration in time range)  folic acid injection 1 mg (has no administration in time range)  thiamine (B-1) injection 100 mg (has no administration in time range)  LORazepam (  ATIVAN) tablet 1 mg (has no administration in time range)    Or  LORazepam (ATIVAN) injection 1 mg (has no administration in time range)  thiamine (VITAMIN B-1) tablet 100 mg (has no administration in time range)    Or   thiamine (B-1) injection 100 mg (has no administration in time range)  folic acid (FOLVITE) tablet 1 mg (has no administration in time range)  multivitamin with minerals tablet 1 tablet (has no administration in time range)    Mobility walks with device Moderate fall risk   Focused Assessments Pulmonary Assessment Handoff:  Lung sounds:   O2 Device: Room Air     , n/a    R Recommendations: See Admitting Provider Note  Report given to:   Additional Notes: n/a

## 2018-05-29 NOTE — Telephone Encounter (Signed)
Called and spoke with pt's son pt was admitted to the hospital 4 weeks ago and was released on Tuesday and sent to a place called [facility]. Pt's son stated that he has no idea where his father is now. Looks like pt was admitted to the hospital May 21,2020. Pt was sent to the hospital 4 weeks ago due to almost bleeding to death. Notes from hospital (past medical history of hypertension, osteoarthritis, tobacco abuse, history of C. difficile who was admitted to the hospital secondary to acute hemorrhagic shock secondary to GI bleed from a bleeding duodenal ulcer)  The doctor at the hospital had called the son and stated that his father would be lucky to made it through the day and even luckier to make it past 3 days. Pt's son stated that it has been a month and his father is still living.   Pt's son stated that he can't care for his father he must work and he will not allow his father to live with him considering his father smokes like crazy, has a drug and drinking problem, and cusses.  Pt's son stated that he loves but he can not care for him and his father NEEDS help because he obviously can NOT care for himself nor is, he making good choices for himself.   Pt's son stated that he constant has to remind his father where he works or what day of the week it his because he has no idea. When he was cover in his own stool and blood, and urine he stated that he felt fine and didn't want to go to the hospital. He is not in the right state of mind. Son is aware that his father denies everything that is set up to help him but the son doesn't know at this point what else to do.

## 2018-05-29 NOTE — Plan of Care (Signed)

## 2018-05-29 NOTE — Consult Note (Signed)
[facility] SUR[facility]ICAL CONSULTATION NOTE   HISTORY OF PRESENT ILLNESS (HPI):  84 y.o. male who is known to our service 21 days s/p exploratory laparotomy, repair of bleeding duodenal ulcer, duodenoplasty, and placement of feeding J-Tube (05/01). He was discharged from the hospital on 05/19. He again presented to [facility] ED last night for evaluation of worsening abdominal pain, diarrhea, and malaise. He reported associated nausea and vomiting as well as decreased PO intake. No fever, chills, cough, congestion, SOB, CP. Work up in the ED did reveal intra-abdominal free air although again the patient's JP bulb was disconnected from the drain which was a recurring issue during previous admission. He does not have a leukocytosis although sCr is elevated. Overnight, he was admitted to medicine for further work up and management.   This morning, he is uncertain as to why he represented to the ED yesterday. He denied any abdominal pain this morning. He still had his JP drain which the bulb was again missing. No other complaints this morning.   Surgery is consulted by emergency physician Dr. Merlyn Lot, MD in this context for evaluation and management of abdominal pain, diarrhea, malaise in a patient s/p exploratory laparotomy.   PAST MEDICAL HISTORY (PMH):  Past Medical History:  Diagnosis Date  . C. difficile diarrhea 11/2017  . Elevated serum creatinine   . History of melanoma   . Hypertension   . Osteoarthritis   . Tobacco abuse      PAST SURGICAL HISTORY ([place]):  Past Surgical History:  Procedure Laterality Date  . ANKLE FRACTURE SURGERY    . CENTRAL VENOUS CATHETER INSERTION  05/08/2018   Procedure: INSERTION CENTRAL LINE ADULT;  Surgeon: Olean Ree, MD;  Location: [facility];  Service: General;;  . ESOPHAGOGASTRODUODENOSCOPY  05/08/2018   Procedure: ESOPHAGOGASTRODUODENOSCOPY (EGD);  Surgeon: Olean Ree, MD;  Location: [facility];  Service: General;;  .  ESOPHAGOGASTRODUODENOSCOPY (EGD) WITH PROPOFOL N/A 05/03/2018   Procedure: ESOPHAGOGASTRODUODENOSCOPY (EGD) WITH PROPOFOL;  Surgeon: Toledo, Benay Pike, MD;  Location: [facility];  Service: Gastroenterology;  Laterality: N/A;  . EXTERNAL EAR SURGERY    . INTRAMEDULLARY (IM) NAIL INTERTROCHANTERIC Left 12/01/2017   Procedure: INTRAMEDULLARY (IM) NAIL INTERTROCHANTRIC;  Surgeon: Thornton Park, MD;  Location: [facility];  Service: Orthopedics;  Laterality: Left;  . JEJUNOSTOMY  05/08/2018   Procedure: JEJUNOSTOMY;  Surgeon: Olean Ree, MD;  Location: [facility];  Service: General;;  . LAPAROTOMY N/A 05/08/2018   Procedure: EXPLORATORY LAPAROTOMY;  Surgeon: Olean Ree, MD;  Location: [facility];  Service: General;  Laterality: N/A;  . ROTATOR CUFF REPAIR    . TONSILLECTOMY    . TYMPANOPLASTY W/ MASTOIDECTOMY     Patient with reports of mastoid surgery (appears to have had surgery on TM; Left).  . VISCERAL ARTERY INTERVENTION N/A 05/04/2018   Procedure: VISCERAL ARTERY INTERVENTION;  Surgeon: Algernon Huxley, MD;  Location: [facility];  Service: Cardiovascular;  Laterality: N/A;     MEDICATIONS:  Prior to Admission medications   Medication Sig Start Date End Date Taking? Authorizing Provider  amLODipine (NORVASC) 10 MG tablet Take 1 tablet (10 mg total) by mouth daily. 05/26/18 07/25/18 Yes Sainani, Belia Heman, MD  calcium-vitamin D (OSCAL WITH D) 500-200 MG-UNIT tablet Take 1 tablet by mouth 2 (two) times daily. 05/26/18  Yes Henreitta Leber, MD  carvedilol (COREG) 6.25 MG tablet Take 1 tablet (6.25 mg total) by mouth 2 (two) times daily with a meal. 05/26/18 07/25/18 Yes Sainani, Belia Heman, MD  pantoprazole (  PROTONIX) 40 MG tablet Take 1 tablet (40 mg total) by mouth 2 (two) times daily before a meal. 05/26/18 07/25/18 Yes Sainani, Belia Heman, MD  tamsulosin (FLOMAX) 0.4 MG CAPS capsule Take 1 capsule (0.4 mg total) by mouth daily. 05/26/18 07/25/18 Yes Sainani, Belia Heman, MD  traMADol (ULTRAM) 50 MG  tablet Take 1 tablet (50 mg total) by mouth every 8 (eight) hours as needed. 05/26/18  Yes Henreitta Leber, MD  traZODone (DESYREL) 50 MG tablet Take 1 tablet (50 mg total) by mouth at bedtime as needed for up to 30 days for sleep. 05/26/18 06/25/18 Yes Sainani, Belia Heman, MD  feeding supplement, ENSURE ENLIVE, (ENSURE ENLIVE) LIQD Take 237 mLs by mouth 2 (two) times daily between meals. 12/03/17   Fritzi Mandes, MD     ALLERGIES:  Allergies  Allergen Reactions  . Aspirin Other (See Comments)    bleeding     SOCIAL HISTORY:  Social History   Socioeconomic History  . Marital status: Divorced    Spouse name: Not on file  . Number of children: Not on file  . Years of education: Not on file  . Highest education level: Not on file  Occupational History  . Not on file  Social Needs  . Financial resource strain: Not hard at all  . Food insecurity:    Worry: Never true    Inability: Never true  . Transportation needs:    Medical: No    Non-medical: No  Tobacco Use  . Smoking status: Current Every Day Smoker    Packs/day: 1.00    Years: 70.00    Pack years: 70.00    Types: Cigarettes  . Smokeless tobacco: Never Used  Substance and Sexual Activity  . Alcohol use: No  . Drug use: No  . Sexual activity: Never  Lifestyle  . Physical activity:    Days per week: 1 day    Minutes per session: 90 min  . Stress: Not at all  Relationships  . Social connections:    Talks on phone: Not on file    Gets together: Not on file    Attends religious service: Not on file    Active member of club or organization: Not on file    Attends meetings of clubs or organizations: Not on file    Relationship status: Not on file  . Intimate partner violence:    Fear of current or ex partner: Not on file    Emotionally abused: Not on file    Physically abused: Not on file    Forced sexual activity: Not on file  Other Topics Concern  . Not on file  Social History Narrative  . Not on file     FAMILY  HISTORY:  Family History  Problem Relation Age of Onset  . Prostate cancer Neg Hx   . Bladder Cancer Neg Hx   . Kidney cancer Neg Hx       REVIEW OF SYSTEMS:  Review of Systems  Constitutional: Positive for malaise/fatigue. Negative for chills and fever.       + Decreased PO Intake  HENT: Negative for congestion and sore throat.   Respiratory: Negative for cough and shortness of breath.   Cardiovascular: Negative for chest pain and palpitations.  Gastrointestinal: Positive for abdominal pain, diarrhea, nausea and vomiting. Negative for blood in stool and constipation.  Genitourinary: Negative for dysuria and urgency.  Neurological: Negative for dizziness and headaches.  All other systems reviewed and are negative.  VITAL SIGNS:  Temp:  [97.5 F (36.4 C)-98.2 F (36.8 C)] 98.2 F (36.8 C) (05/22 0615) Pulse Rate:  [77-83] 83 (05/22 0615) Resp:  [18-20] 20 (05/22 0615) BP: (107-132)/(51-77) 120/60 (05/22 0615) SpO2:  [98 %-100 %] 99 % (05/22 0615) Weight:  [49.9 kg-54.9 kg] 54.9 kg (05/22 0615)     Height: 5\' 8"  (172.7 cm) Weight: 54.9 kg BMI (Calculated): 18.4   INTAKE/OUTPUT:  This shift: Total I/O In: -  Out: 100 [Urine:100]  Last 2 shifts: @IOLAST2SHIFTS @   PHYSICAL EXAM:  Physical Exam Vitals signs and nursing note reviewed. Exam conducted with a chaperone present.  Constitutional:      General: He is not in acute distress.    Appearance: He is well-developed. He is not ill-appearing.     Comments: Patient sitting up moving around bed/room  HENT:     Head: Normocephalic and atraumatic.  Cardiovascular:     Rate and Rhythm: Normal rate and regular rhythm.     Heart sounds: Normal heart sounds. No murmur. No friction rub. No gallop.   Pulmonary:     Effort: Pulmonary effort is normal. No respiratory distress.     Breath sounds: Normal breath sounds. No wheezing or rhonchi.  Abdominal:     General: Abdomen is flat. A surgical scar is present.     Palpations:  Abdomen is soft.     Tenderness: There is no abdominal tenderness. There is no guarding or rebound.     Comments: Well healed midline laparotomy incision No evidence of peritonitis JP drain in right abdomen, no bulb present J-Tube in left abdomen  Genitourinary:    Comments: Deferred Skin:    General: Skin is warm and dry.     Coloration: Skin is not jaundiced or pale.  Neurological:     General: No focal deficit present.     Mental Status: He is alert and oriented to person, place, and time.  Psychiatric:        Mood and Affect: Mood normal.        Behavior: Behavior normal.       Labs:  CBC Latest Ref Rng & Units 05/29/2018 05/28/2018 05/21/2018  WBC 4.0 - 10.5 K/uL 8.0 10.2 6.8  Hemoglobin 13.0 - 17.0 g/dL 8.8(L) 10.0(L) 8.5(L)  Hematocrit 39.0 - 52.0 % 27.5(L) 31.1(L) 26.7(L)  Platelets 150 - 400 K/uL 373 431(H) 449(H)   CMP Latest Ref Rng & Units 05/29/2018 05/28/2018 05/21/2018  Glucose 70 - 99 mg/dL 109(H) 154(H) 90  BUN 8 - 23 mg/dL 27(H) 28(H) 18  Creatinine 0.61 - 1.24 mg/dL 2.12(H) 2.32(H) 1.79(H)  Sodium 135 - 145 mmol/L 139 137 141  Potassium 3.5 - 5.1 mmol/L 3.5 4.4 3.8  Chloride 98 - 111 mmol/L 110 106 112(H)  CO2 22 - 32 mmol/L 22 23 23   Calcium 8.9 - 10.3 mg/dL 8.0(L) 8.3(L) 8.0(L)  Total Protein 6.5 - 8.1 g/dL - 6.3(L) -  Total Bilirubin 0.3 - 1.2 mg/dL - 0.1(L) -  Alkaline Phos 38 - 126 U/L - 93 -  AST 15 - 41 U/L - 15 -  ALT 0 - 44 U/L - 10 -     Imaging studies:   CXR (05/29/2018) personally reviewed and radiologist report reviewed: IMPRESSION: No evidence of acute disease.  CT Abdomen/Pelvis (05/28/2018) personally reviewed and radiologist report reviewed: IMPRESSION: 1. Scattered foci of pneumoperitoneum adjacent to the liver, presumably related to history of recent laparotomy and the presence of an intra-abdominal drainage catheter. Edema and indistinct  appearance near the duodenal bulb and head of pancreas with small gas collections in the  region which may be residual air from duodenal ulcer repair, however consider short interval CT follow-up to exclude small leak or increasing air in the region. Soft tissue stranding and edema within the central and right upper quadrant mesentery felt to reflect resolving operative change. 2. Additional gas and fluid collection near second portion of duodenum, felt to correspond to previously demonstrated diverticulum. 3. New left lower quadrant feeding jejunostomy tube 4. Slightly thick-walled appearance of the urinary bladder, questionable for cystitis   Assessment/Plan:  84 y.o. male with readmitted for what appears to be failure to thrive and diarrhea 21 days s/p exploratory laparotomy, repair of bleeding duodenal ulcer, duodenoplasty, and placement of feeding J-Tube    - Okay to start clear liquids; ADAT; wean IVF  - pain control prn  - Monitor abdominal examination; on-going bowel function  - Follow up c. diff   - No acute surgical issues; continue J-tube for at least 3 more weeks   - further management per primary team   All of the above findings and recommendations were discussed with the patient ant the medical team (Dr Darvin Neighbours, MD), and all of patient's questions were answered to his expressed satisfaction.  Thank you for the opportunity to participate in this patient's care.   -- Edison Simon, PA-C [facility] 05/29/2018, 7:27 AM [phone] M-F: 7am - 4pm

## 2018-05-29 NOTE — Plan of Care (Signed)
Updated son, [place].  He reported that pt may not have a place to go back to upon discharge.  The owner of [facility] says she can't give him the care he requires. Also spoke to [place] with [facility] [employer] and gave her an update.

## 2018-05-30 DIAGNOSIS — R1013 Epigastric pain: Secondary | ICD-10-CM

## 2018-05-30 LAB — BASIC METABOLIC PANEL
Anion gap: 5 (ref 5–15)
BUN: 21 mg/dL (ref 8–23)
CO2: 22 mmol/L (ref 22–32)
Calcium: 7.9 mg/dL — ABNORMAL LOW (ref 8.9–10.3)
Chloride: 113 mmol/L — ABNORMAL HIGH (ref 98–111)
Creatinine, Ser: 1.85 mg/dL — ABNORMAL HIGH (ref 0.61–1.24)
GFR calc Af Amer: 38 mL/min — ABNORMAL LOW (ref >60)
GFR calc non Af Amer: 33 mL/min — ABNORMAL LOW (ref >60)
Glucose, Bld: 106 mg/dL — ABNORMAL HIGH (ref 70–99)
Potassium: 3.8 mmol/L (ref 3.5–5.1)
Sodium: 140 mmol/L (ref 135–145)

## 2018-05-30 NOTE — TOC Progression Note (Signed)
Transition of Care [facility]) - Progression Note    Patient Details  Name: Justin Andrews MRN: [id number] Date of Birth: [birth year]  Transition of Care [facility]) CM/SW [place], [place] Phone Number: 05/30/2018, 8:27 AM  Clinical Narrative:   [facility] team received a call from Chrystie Nose, group home director for [facility]. [name] states that she can not accept patient back in her facility due to the amount of care he needs. Per [name] patient is incontinent and is unable to care for JP drain as indicated at last discharge. DSS is aware of this as well. TOC team will continue to follow for discharge planning.     Expected Discharge Plan: Group Home([facility]) Barriers to Discharge: Continued Medical Work up  Expected Discharge Plan and Services Expected Discharge Plan: Group Home([facility])   Discharge Planning Services: CM Consult   Living arrangements for the past 2 months: Group Home                                       Social Determinants of Health (SDOH) Interventions    Readmission Risk Interventions Readmission Risk Prevention Plan 05/07/2018  Transportation Screening Complete  PCP or Specialist Appt within 3-5 Days Complete  HRI or Home Care Consult Complete  Medication Review (RN Care Manager) Complete  Some recent data might be hidden

## 2018-05-30 NOTE — Progress Notes (Signed)
[place] [facility] [place] NAME: Justin Andrews    MR#:  [id number]  DATE OF BIRTH:  08/27/1934  SUBJECTIVE:  CHIEF COMPLAINT:   Chief Complaint  Patient presents with  . Abdominal Pain  . Post-op Problem   No vomiting or diarrhea. Afebrile  REVIEW OF SYSTEMS:    Review of Systems  Constitutional: Negative for chills and fever.  HENT: Negative for sore throat.   Eyes: Negative for blurred vision, double vision and pain.  Respiratory: Negative for cough, hemoptysis, shortness of breath and wheezing.   Cardiovascular: Negative for chest pain, palpitations, orthopnea and leg swelling.  Gastrointestinal: Positive for abdominal pain. Negative for constipation, diarrhea, heartburn, nausea and vomiting.  Genitourinary: Negative for dysuria and hematuria.  Musculoskeletal: Negative for back pain and joint pain.  Skin: Negative for rash.  Neurological: Negative for sensory change, speech change, focal weakness and headaches.  Endo/Heme/Allergies: Does not bruise/bleed easily.  Psychiatric/Behavioral: Negative for depression. The patient is not nervous/anxious.     DRUG ALLERGIES:   Allergies  Allergen Reactions  . Aspirin Other (See Comments)    bleeding    VITALS:  Blood pressure 119/64, pulse 82, temperature 98.4 F (36.9 C), temperature source Oral, resp. rate 20, height 5\' 8"  (1.727 m), weight 54.9 kg, SpO2 97 %.  PHYSICAL EXAMINATION:   Physical Exam  GENERAL:  84 y.o.-year-old patient lying in the bed with no acute distress.  EYES: Pupils equal, round, reactive to light and accommodation. No scleral icterus. Extraocular muscles intact.  HEENT: Head atraumatic, normocephalic. Oropharynx and nasopharynx clear.  NECK:  Supple, no jugular venous distention. No thyroid enlargement, no tenderness.  LUNGS: Normal breath sounds bilaterally, no wheezing, rales, rhonchi. No use of accessory muscles of respiration.  CARDIOVASCULAR: S1, S2  normal. No murmurs, rubs, or gallops.  ABDOMEN: Soft, mild tenderness at surgical site, nondistended. Bowel sounds present. No organomegaly or mass.  EXTREMITIES: No cyanosis, clubbing or edema b/l.    NEUROLOGIC: Cranial nerves II through XII are intact. No focal Motor or sensory deficits b/l.   PSYCHIATRIC: The patient is alert and oriented x 3.  SKIN: No obvious rash, lesion, or ulcer.   LABORATORY PANEL:   CBC Recent Labs  Lab 05/29/18 0434  WBC 8.0  HGB 8.8*  HCT 27.5*  PLT 373   ------------------------------------------------------------------------------------------------------------------ Chemistries  Recent Labs  Lab 05/28/18 1654  05/30/18 0649  NA 137   < > 140  K 4.4   < > 3.8  CL 106   < > 113*  CO2 23   < > 22  GLUCOSE 154*   < > 106*  BUN 28*   < > 21  CREATININE 2.32*   < > 1.85*  CALCIUM 8.3*   < > 7.9*  AST 15  --   --   ALT 10  --   --   ALKPHOS 93  --   --   BILITOT 0.1*  --   --    < > = values in this interval not displayed.   ------------------------------------------------------------------------------------------------------------------  Cardiac Enzymes Recent Labs  Lab 05/29/18 0454  TROPONINI <0.03   ------------------------------------------------------------------------------------------------------------------  RADIOLOGY:  Ct Abdomen Pelvis Wo Contrast  Result Date: 05/28/2018 CLINICAL DATA:  Abdominal pain, recent endoscopy and surgery EXAM: CT ABDOMEN AND PELVIS WITHOUT CONTRAST TECHNIQUE: Multidetector CT imaging of the abdomen and pelvis was performed following the standard protocol without IV contrast. COMPARISON:  05/02/2018 CT, UGI 05/11/2018 FINDINGS: Lower chest: Lung  bases demonstrate no acute consolidation or effusion. The heart size is within normal limits. Hepatobiliary: No focal liver abnormality is seen. No gallstones, gallbladder wall thickening, or biliary dilatation. Pancreas: Indistinct near the pancreatic head.  Spleen: Normal in size without focal abnormality. Adrenals/Urinary Tract: Adrenal glands are normal. Nonspecific perinephric fat stranding. Cysts in the left kidney. No hydronephrosis. Slightly thick-walled appearance of the urinary bladder. Stomach/Bowel: The stomach is nonenlarged. Gastroduodenal junction partially obscured by vascular coil artifact. Small foci of gas near the duodenal bulb. Indistinct edema here. Irregular gas at the second portion of duodenum consistent with diverticulum. No dilated small bowel. Interim placement of left abdominal jejunostomy tube. The tip is visible in the upper anterior pelvis bowel loop. Negative appendix. Vascular/Lymphatic: Moderate aortic atherosclerosis without aneurysm. No significantly enlarged lymph nodes. Reproductive: Slightly enlarged prostate gland Other: No significant free fluid. Small foci of pneumoperitoneum at the periphery of the liver. Surgical drain in the right upper quadrant with tip terminating near the pyloric region of the stomach. Ventral incision. No subcutaneous fluid collections. Small fat containing right inguinal hernia. Soft tissue stranding within the central and right upper quadrant abdominal mesentery. Musculoskeletal: No acute or suspicious abnormality. IMPRESSION: 1. Scattered foci of pneumoperitoneum adjacent to the liver, presumably related to history of recent laparotomy and the presence of an intra-abdominal drainage catheter. Edema and indistinct appearance near the duodenal bulb and head of pancreas with small gas collections in the region which may be residual air from duodenal ulcer repair, however consider short interval CT follow-up to exclude small leak or increasing air in the region. Soft tissue stranding and edema within the central and right upper quadrant mesentery felt to reflect resolving operative change. 2. Additional gas and fluid collection near second portion of duodenum, felt to correspond to previously demonstrated  diverticulum. 3. New left lower quadrant feeding jejunostomy tube 4. Slightly thick-walled appearance of the urinary bladder, questionable for cystitis Electronically Signed   By: Donavan Foil M.D.   On: 05/28/2018 20:31   Dg Chest Port 1 View  Result Date: 05/29/2018 CLINICAL DATA:  Dyspnea EXAM: PORTABLE CHEST 1 VIEW COMPARISON:  Three days ago FINDINGS: Normal heart size. Stable aortic tortuosity. Large lung volumes with chronic interstitial coarsening. Remote bilateral rib fractures. IMPRESSION: No evidence of acute disease. Electronically Signed   By: Monte Fantasia M.D.   On: 05/29/2018 05:32     ASSESSMENT AND PLAN:   *  C diff infection abd pain improved No further diarrhea. Started on PO vancomycin  * Hypertension --Norvasc restarted - Monitor  *  History of EtOH abuse - CIWA protocol   *  GERD - Protonix  *  History of pneumonia/recent - Will monitor closely with repeated chest x-ray in the a.m. -  CBC in the a.m. - DuoNeb every 6 hours as needed - Incentive spirometry  *  Acute kidney injury over CKD stage III is improving.  DVT prophylaxis initiated  All the records are reviewed and case discussed with Care Management/Social Worker Management plans discussed with the patient, family and they are in agreement.  CODE STATUS: DNR/DNI  TOTAL TIME TAKING CARE OF THIS PATIENT: 35 minutes.   POSSIBLE D/C IN 1-2 DAYS, DEPENDING ON CLINICAL CONDITION.  Leia Alf Loveta Dellis M.D on 05/30/2018 at 11:56 AM  Between 7am to 6pm - Pager - [phone]  After 6pm go to [email or web address] - password [email or web address] [facility]  Office  [phone]  CC: Primary care physician; Leone Haven, MD  Note: This dictation was prepared with Dragon dictation along with smaller phrase technology. Any transcriptional errors that result from this process are unintentional.

## 2018-05-30 NOTE — Progress Notes (Signed)
CC: failure to thrive Subjective: C diff positive, no abd pain, drain removed yesterday. Taking PO and had a formed BM Objective: Vital signs in last 24 hours: Temp:  [98.3 F (36.8 C)-98.4 F (36.9 C)] 98.4 F (36.9 C) (05/23 0451) Pulse Rate:  [81-82] 82 (05/23 0451) Resp:  [20] 20 (05/23 0451) BP: (108-119)/(54-64) 119/64 (05/23 0451) SpO2:  [97 %-99 %] 97 % (05/23 0451) Last BM Date: 05/30/18  Intake/Output from previous day: 05/22 0701 - 05/23 0700 In: 1668.1 [I.V.:1668.1] Out: 800 [Urine:800] Intake/Output this shift: No intake/output data recorded.  Physical exam:  Lovena Le male in no acute distress.  Awake and alert.  Following commands. Abd: soft, Nt. JP removed yesterday, no peritonitis, Feeding jejunostomy in place.  Ext: well perfused and warm  Lab Results: CBC  Recent Labs    05/28/18 1654 05/29/18 0434  WBC 10.2 8.0  HGB 10.0* 8.8*  HCT 31.1* 27.5*  PLT 431* 373   BMET Recent Labs    05/29/18 0434 05/30/18 0649  NA 139 140  K 3.5 3.8  CL 110 113*  CO2 22 22  GLUCOSE 109* 106*  BUN 27* 21  CREATININE 2.12* 1.85*  CALCIUM 8.0* 7.9*   PT/INR No results for input(s): LABPROT, INR in the last 72 hours. ABG No results for input(s): PHART, HCO3 in the last 72 hours.  Invalid input(s): PCO2, PO2  Studies/Results: Ct Abdomen Pelvis Wo Contrast  Result Date: 05/28/2018 CLINICAL DATA:  Abdominal pain, recent endoscopy and surgery EXAM: CT ABDOMEN AND PELVIS WITHOUT CONTRAST TECHNIQUE: Multidetector CT imaging of the abdomen and pelvis was performed following the standard protocol without IV contrast. COMPARISON:  05/02/2018 CT, UGI 05/11/2018 FINDINGS: Lower chest: Lung bases demonstrate no acute consolidation or effusion. The heart size is within normal limits. Hepatobiliary: No focal liver abnormality is seen. No gallstones, gallbladder wall thickening, or biliary dilatation. Pancreas: Indistinct near the pancreatic head. Spleen: Normal in size  without focal abnormality. Adrenals/Urinary Tract: Adrenal glands are normal. Nonspecific perinephric fat stranding. Cysts in the left kidney. No hydronephrosis. Slightly thick-walled appearance of the urinary bladder. Stomach/Bowel: The stomach is nonenlarged. Gastroduodenal junction partially obscured by vascular coil artifact. Small foci of gas near the duodenal bulb. Indistinct edema here. Irregular gas at the second portion of duodenum consistent with diverticulum. No dilated small bowel. Interim placement of left abdominal jejunostomy tube. The tip is visible in the upper anterior pelvis bowel loop. Negative appendix. Vascular/Lymphatic: Moderate aortic atherosclerosis without aneurysm. No significantly enlarged lymph nodes. Reproductive: Slightly enlarged prostate gland Other: No significant free fluid. Small foci of pneumoperitoneum at the periphery of the liver. Surgical drain in the right upper quadrant with tip terminating near the pyloric region of the stomach. Ventral incision. No subcutaneous fluid collections. Small fat containing right inguinal hernia. Soft tissue stranding within the central and right upper quadrant abdominal mesentery. Musculoskeletal: No acute or suspicious abnormality. IMPRESSION: 1. Scattered foci of pneumoperitoneum adjacent to the liver, presumably related to history of recent laparotomy and the presence of an intra-abdominal drainage catheter. Edema and indistinct appearance near the duodenal bulb and head of pancreas with small gas collections in the region which may be residual air from duodenal ulcer repair, however consider short interval CT follow-up to exclude small leak or increasing air in the region. Soft tissue stranding and edema within the central and right upper quadrant mesentery felt to reflect resolving operative change. 2. Additional gas and fluid collection near second portion of duodenum, felt to correspond to previously  demonstrated diverticulum. 3. New  left lower quadrant feeding jejunostomy tube 4. Slightly thick-walled appearance of the urinary bladder, questionable for cystitis Electronically Signed   By: Donavan Foil M.D.   On: 05/28/2018 20:31   Dg Chest Port 1 View  Result Date: 05/29/2018 CLINICAL DATA:  Dyspnea EXAM: PORTABLE CHEST 1 VIEW COMPARISON:  Three days ago FINDINGS: Normal heart size. Stable aortic tortuosity. Large lung volumes with chronic interstitial coarsening. Remote bilateral rib fractures. IMPRESSION: No evidence of acute disease. Electronically Signed   By: Monte Fantasia M.D.   On: 05/29/2018 05:32    Anti-infectives: Anti-infectives (From admission, onward)   Start     Dose/Rate Route Frequency Ordered Stop   05/29/18 2200  vancomycin (VANCOCIN) 50 mg/mL oral solution 125 mg     125 mg Oral 4 times daily 05/29/18 1805 06/08/18 1759   05/29/18 1515  vancomycin (VANCOCIN) 50 mg/mL oral solution 125 mg  Status:  Discontinued     125 mg Oral 4 times daily 05/29/18 1502 05/29/18 1804      Assessment/Plan: Doing Well may advance to regular diet.  We will sign off at this time no surgical issues.  Caroleen Hamman, MD, [facility]  05/30/2018

## 2018-05-31 MED ORDER — VANCOMYCIN HCL 125 MG PO CAPS: 125.0000 mg | ORAL_CAPSULE | Freq: Four times a day (QID) | ORAL | 0 refills | Status: DC

## 2018-05-31 MED ORDER — TRAMADOL HCL 50 MG PO TABS: 50.0000 mg | ORAL_TABLET | Freq: Three times a day (TID) | ORAL | 0 refills | Status: DC | PRN

## 2018-05-31 NOTE — Progress Notes (Signed)
PT Cancellation Note  Patient Details Name: Justin Andrews MRN: [id number] DOB: September 29, 1934   Cancelled Treatment:    Reason Eval/Treat Not Completed: PT screened, no needs identified, will sign off(Consult received and chart reviewed.  Patient observed to be moving freely and independently about room without assist device, without difficulty.  Safely managing self-care and toileting needs without safety concern.  No acute PT needs identified at this time; RN informed/aware and in agreement.  Will complete PT order at this time.  Please re-consult if needs change.)   [name] H. Owens Shark, PT, DPT, NCS 05/31/18, 1:10 PM [phone]

## 2018-05-31 NOTE — Progress Notes (Signed)
Justin Andrews at [address] NAME: Justin Andrews    MR#:  [id number]  DATE OF BIRTH:  1934-07-05  SUBJECTIVE:  CHIEF COMPLAINT:   Chief Complaint  Patient presents with  . Abdominal Pain  . Post-op Problem   No vomiting or diarrhea. Afebrile Mild abdominal pain  REVIEW OF SYSTEMS:    Review of Systems  Constitutional: Negative for chills and fever.  HENT: Negative for sore throat.   Eyes: Negative for blurred vision, double vision and pain.  Respiratory: Negative for cough, hemoptysis, shortness of breath and wheezing.   Cardiovascular: Negative for chest pain, palpitations, orthopnea and leg swelling.  Gastrointestinal: Positive for abdominal pain. Negative for constipation, diarrhea, heartburn, nausea and vomiting.  Genitourinary: Negative for dysuria and hematuria.  Musculoskeletal: Negative for back pain and joint pain.  Skin: Negative for rash.  Neurological: Negative for sensory change, speech change, focal weakness and headaches.  Endo/Heme/Allergies: Does not bruise/bleed easily.  Psychiatric/Behavioral: Negative for depression. The patient is not nervous/anxious.     DRUG ALLERGIES:   Allergies  Allergen Reactions  . Aspirin Other (See Comments)    bleeding    VITALS:  Blood pressure (!) 123/55, pulse 87, temperature 98.2 F (36.8 C), temperature source Oral, resp. rate 18, height 5\' 8"  (1.727 m), weight 54.9 kg, SpO2 97 %.  PHYSICAL EXAMINATION:   Physical Exam  GENERAL:  84 y.o.-year-old patient lying in the bed with no acute distress.  EYES: Pupils equal, round, reactive to light and accommodation. No scleral icterus. Extraocular muscles intact.  HEENT: Head atraumatic, normocephalic. Oropharynx and nasopharynx clear.  NECK:  Supple, no jugular venous distention. No thyroid enlargement, no tenderness.  LUNGS: Normal breath sounds bilaterally, no wheezing, rales, rhonchi. No use of accessory muscles of respiration.   CARDIOVASCULAR: S1, S2 normal. No murmurs, rubs, or gallops.  ABDOMEN: Soft, mild tenderness at surgical site, nondistended. Bowel sounds present. No organomegaly or mass. J tube present EXTREMITIES: No cyanosis, clubbing or edema b/l.    NEUROLOGIC: Cranial nerves II through XII are intact. No focal Motor or sensory deficits b/l.   PSYCHIATRIC: The patient is alert and oriented x 3.  SKIN: No obvious rash, lesion, or ulcer.   LABORATORY PANEL:   CBC Recent Labs  Lab 05/29/18 0434  WBC 8.0  HGB 8.8*  HCT 27.5*  PLT 373   ------------------------------------------------------------------------------------------------------------------ Chemistries  Recent Labs  Lab 05/28/18 1654  05/30/18 0649  NA 137   < > 140  K 4.4   < > 3.8  CL 106   < > 113*  CO2 23   < > 22  GLUCOSE 154*   < > 106*  BUN 28*   < > 21  CREATININE 2.32*   < > 1.85*  CALCIUM 8.3*   < > 7.9*  AST 15  --   --   ALT 10  --   --   ALKPHOS 93  --   --   BILITOT 0.1*  --   --    < > = values in this interval not displayed.   ------------------------------------------------------------------------------------------------------------------  Cardiac Enzymes Recent Labs  Lab 05/29/18 0454  TROPONINI <0.03   ------------------------------------------------------------------------------------------------------------------  RADIOLOGY:  No results found.   ASSESSMENT AND PLAN:   *  C diff infection abd pain improved No further diarrhea. Started on PO vancomycin  * Hypertension --Norvasc restarted - Monitor  *  History of EtOH abuse - CIWA protocol   *  GERD -  Protonix  *  History of pneumonia/recent - Will monitor closely with repeated chest x-ray in the a.m. -  CBC in the a.m. - DuoNeb every 6 hours as needed - Incentive spirometry  *  Acute kidney injury over CKD stage III is improving.  DVT prophylaxis Lovenox  Patient's group home unable to take him back.  Trying to find a  new place.  If unable patient wants to go stay in a motel.  Discussed with son over the phone.  All the records are reviewed and case discussed with Care Management/Social Worker Management plans discussed with the patient, family and they are in agreement.  CODE STATUS: DNR/DNI  TOTAL TIME TAKING CARE OF THIS PATIENT: 35 minutes.   POSSIBLE D/C IN 1-2 DAYS, DEPENDING ON CLINICAL CONDITION.  Leia Alf [name] M.D on 05/31/2018 at 11:40 AM  Between 7am to 6pm - Pager - [phone]  After 6pm go to [email or web address] - password [email or web address] [facility]  Office  [phone]  CC: Primary care physician; Leone Haven, MD  Note: This dictation was prepared with Dragon dictation along with smaller phrase technology. Any transcriptional errors that result from this process are unintentional.

## 2018-05-31 NOTE — Progress Notes (Signed)
Advance care planning  Purpose of Encounter C diff  Parties in Attendance Patient  Patients Decisional capacity Patient is alert and oriented.  Able to make medical decisions.  Healthcare power of attorney is his son Justin Andrews  No ACP documents in place  Discussed in detail regarding c diff, abd pain.  Treatment plan , prognosis discussed.  All questions answered  Discussed CODE STATUS and patient wishes to be DO NOT RESUSCITATE DO NOT INTUBATE.  Orders entered and CODE STATUS changed  DNR/DNI  Time spent - 17 minutes

## 2018-05-31 NOTE — TOC Progression Note (Signed)
Transition of Care [facility] - Progression Note    Patient Details  Name: EDDER BELLANCA MRN: [id number] Date of Birth: [birth year]  Transition of Care [facility]) CM/SW Contact  Ross Ludwig, [place] Phone Number: 05/31/2018, 11:15 AM  Clinical Narrative:     CSW was informed that patient's drain has been removed and nurses have been getting him up to walk around.  Patient has been using the toilet as well, CSW attempted to contact Chrystie Nose [phone] group home owner [facility], had to leave a message awaiting for a call back.   Expected Discharge Plan: Group Home([facility]) Barriers to Discharge: Continued Medical Work up  Expected Discharge Plan and Services Expected Discharge Plan: Group Home([facility] [place] [facility])   Discharge Planning Services: CM Consult   Living arrangements for the past 2 months: Group Home                                       Social Determinants of Health (SDOH) Interventions    Readmission Risk Interventions Readmission Risk Prevention Plan 05/07/2018  Transportation Screening Complete  PCP or Specialist Appt within 3-5 Days Complete  HRI or Home Care Consult Complete  Medication Review (RN Care Manager) Complete  Some recent data might be hidden

## 2018-06-01 MED ORDER — ALPRAZOLAM 0.25 MG PO TABS
0.2500 mg | ORAL_TABLET | Freq: Two times a day (BID) | ORAL | Status: DC | PRN
  Administered 2018-06-01 – 2018-06-02 (×3): 0.25 mg via ORAL
  Filled 2018-06-01 (×4): qty 1

## 2018-06-01 MED ORDER — PANTOPRAZOLE SODIUM 40 MG IV SOLR
40.0000 mg | Freq: Two times a day (BID) | INTRAVENOUS | Status: DC
  Administered 2018-06-01 – 2018-06-02 (×2): 40 mg via INTRAVENOUS
  Filled 2018-06-01 (×2): qty 40

## 2018-06-01 NOTE — Care Management Important Message (Signed)
Important Message  Patient Details  Name: Justin Andrews MRN: [id number] Date of Birth: Mar 19, 1934   Medicare Important Message Given:  Yes    Su Hilt, RN 06/01/2018, 10:10 AM

## 2018-06-01 NOTE — Progress Notes (Signed)
[place] at [place] NAME: Justin Andrews    MR#:  [id number]  DATE OF BIRTH:  December 11, 1934  SUBJECTIVE:  CHIEF COMPLAINT:   Chief Complaint  Patient presents with  . Abdominal Pain  . Post-op Problem   Up and ambulating.  Asking to go out and smoke. No diarrhea  REVIEW OF SYSTEMS:    Review of Systems  Constitutional: Negative for chills and fever.  HENT: Negative for sore throat.   Eyes: Negative for blurred vision, double vision and pain.  Respiratory: Negative for cough, hemoptysis, shortness of breath and wheezing.   Cardiovascular: Negative for chest pain, palpitations, orthopnea and leg swelling.  Gastrointestinal: Positive for abdominal pain. Negative for constipation, diarrhea, heartburn, nausea and vomiting.  Genitourinary: Negative for dysuria and hematuria.  Musculoskeletal: Negative for back pain and joint pain.  Skin: Negative for rash.  Neurological: Negative for sensory change, speech change, focal weakness and headaches.  Endo/Heme/Allergies: Does not bruise/bleed easily.  Psychiatric/Behavioral: Negative for depression. The patient is not nervous/anxious.     DRUG ALLERGIES:   Allergies  Allergen Reactions  . Aspirin Other (See Comments)    bleeding    VITALS:  Blood pressure 111/61, pulse 87, temperature 98.7 F (37.1 C), resp. rate 19, height 5\' 8"  (1.727 m), weight 54.9 kg, SpO2 95 %.  PHYSICAL EXAMINATION:   Physical Exam  GENERAL:  84 y.o.-year-old patient lying in the bed with no acute distress.  EYES: Pupils equal, round, reactive to light and accommodation. No scleral icterus. Extraocular muscles intact.  HEENT: Head atraumatic, normocephalic. Oropharynx and nasopharynx clear.  NECK:  Supple, no jugular venous distention. No thyroid enlargement, no tenderness.  LUNGS: Normal breath sounds bilaterally, no wheezing, rales, rhonchi. No use of accessory muscles of respiration.  CARDIOVASCULAR: S1, S2  normal. No murmurs, rubs, or gallops.  ABDOMEN: Soft, mild tenderness at surgical site, nondistended. Bowel sounds present. No organomegaly or mass. J tube present EXTREMITIES: No cyanosis, clubbing or edema b/l.    NEUROLOGIC: Cranial nerves II through XII are intact. No focal Motor or sensory deficits b/l.   PSYCHIATRIC: The patient is alert and oriented x 3.  SKIN: No obvious rash, lesion, or ulcer.   LABORATORY PANEL:   CBC Recent Labs  Lab 05/29/18 0434  WBC 8.0  HGB 8.8*  HCT 27.5*  PLT 373   ------------------------------------------------------------------------------------------------------------------ Chemistries  Recent Labs  Lab 05/28/18 1654  05/30/18 0649  NA 137   < > 140  K 4.4   < > 3.8  CL 106   < > 113*  CO2 23   < > 22  GLUCOSE 154*   < > 106*  BUN 28*   < > 21  CREATININE 2.32*   < > 1.85*  CALCIUM 8.3*   < > 7.9*  AST 15  --   --   ALT 10  --   --   ALKPHOS 93  --   --   BILITOT 0.1*  --   --    < > = values in this interval not displayed.   ------------------------------------------------------------------------------------------------------------------  Cardiac Enzymes Recent Labs  Lab 05/29/18 0454  TROPONINI <0.03   ------------------------------------------------------------------------------------------------------------------  RADIOLOGY:  No results found.   ASSESSMENT AND PLAN:   *  C diff infection abd pain improved No further diarrhea. Started on PO vancomycin  * Hypertension --Norvasc restarted - Monitor  *  History of EtOH abuse - CIWA protocol   *  GERD -  Protonix  *  History of pneumonia/recent - DuoNeb every 6 hours as needed - Incentive spirometry - No resp symptoms at this time  *  Acute kidney injury over CKD stage III is resolved  DVT prophylaxis Lovenox  Unable to get in touch with patient's group home.  Discussed with son and he cannot take him to his house.  Difficult situation.  Social  worker trying to get in touch with group home.  Discharge when disposition is available  All the records are reviewed and case discussed with Care Management/Social Worker Management plans discussed with the patient, family and they are in agreement.  CODE STATUS: DNR/DNI  TOTAL TIME TAKING CARE OF THIS PATIENT: 35 minutes.   POSSIBLE D/C IN 1-2 DAYS, DEPENDING ON CLINICAL CONDITION.  Justin Andrews M.D on 06/01/2018 at 1:53 PM  Between 7am to 6pm - Pager - [phone]  After 6pm go to [email or web address] - password [email or web address] [facility]  Office  [phone]  CC: Primary care physician; Leone Haven, MD  Note: This dictation was prepared with Dragon dictation along with smaller phrase technology. Any transcriptional errors that result from this process are unintentional.

## 2018-06-01 NOTE — Plan of Care (Signed)
Pt is very anxious and restless.  Told me he's thought of taking a running jump out of the window, but these thoughts are fleeting.  Pt continues to c/o abdominal pain.  He might be experiencing some PTSD from his 2 tours in [place] and other life events.  He's very depressed and lonely.  Was able to get order for xanax for him bid, and asked the chaplain to come see him.  Pt really wants to leave, but son can't take care of him and not sure group home will take him back.  Diarrhea has improved on oral vanc.  Gave update to son, [place]. He's had no symptoms of ETOH and told me his last drink was more than a month ago.

## 2018-06-02 MED ORDER — DIPHENHYDRAMINE HCL 25 MG PO CAPS
ORAL_CAPSULE | ORAL | Status: AC
  Filled 2018-06-02: qty 1

## 2018-06-02 MED ORDER — DIPHENHYDRAMINE HCL 25 MG PO CAPS
25.0000 mg | ORAL_CAPSULE | Freq: Once | ORAL | Status: AC
  Administered 2018-06-02: 25 mg via ORAL

## 2018-06-02 MED ORDER — PANTOPRAZOLE SODIUM 40 MG PO TBEC
40.0000 mg | DELAYED_RELEASE_TABLET | Freq: Two times a day (BID) | ORAL | Status: DC
  Administered 2018-06-02 – 2018-06-17 (×29): 40 mg via ORAL
  Filled 2018-06-02 (×30): qty 1

## 2018-06-02 NOTE — TOC Progression Note (Signed)
Transition of Care [facility]) - Progression Note    Patient Details  Name: Justin Andrews MRN: [id number] Date of Birth: 1934-03-20  Transition of Care [facility]) CM/SW [place], [place] Phone Number: 06/02/2018, 8:19 PM  Clinical Narrative: CSW left voicemail for Chrystie Nose [phone], group home director again today to see if patient can return to group home. CSW awaiting call back. CSW will continue to follow for discharge planning       Expected Discharge Plan: Group Home([facility]) Barriers to Discharge: Continued Medical Work up  Expected Discharge Plan and Services Expected Discharge Plan: Group Home([facility])   Discharge Planning Services: CM Consult   Living arrangements for the past 2 months: Group Home                                       Social Determinants of Health (SDOH) Interventions    Readmission Risk Interventions Readmission Risk Prevention Plan 05/07/2018  Transportation Screening Complete  PCP or Specialist Appt within 3-5 Days Complete  HRI or Home Care Consult Complete  Medication Review (RN Care Manager) Complete  Some recent data might be hidden

## 2018-06-02 NOTE — Progress Notes (Signed)
Patient ID: Justin Andrews, male   DOB: Jul 22, 1934, 84 y.o.   MRN: [id number]  [facility] PROGRESS NOTE  Justin Andrews:[id number] DOB: 09-01-1934 DOA: 05/28/2018 PCP: Leone Haven, MD  HPI/Subjective: Patient feeling okay.  No further diarrhea.  Had a normal formed bowel movement.  Some abdominal discomfort.  No nausea or vomiting.  Tolerating diet.  Objective: Vitals:   06/01/18 2018 06/02/18 0951  BP: 122/64 121/60  Pulse: 79   Resp: 18   Temp: 97.9 F (36.6 C) 98.1 F (36.7 C)  SpO2: 98% 97%   No intake or output data in the 24 hours ending 06/02/18 1454 Filed Weights   05/28/18 1638 05/29/18 0615  Weight: 49.9 kg 54.9 kg    ROS: Review of Systems  Constitutional: Negative for chills and fever.  Eyes: Negative for blurred vision.  Respiratory: Negative for cough and shortness of breath.   Cardiovascular: Negative for chest pain.  Gastrointestinal: Positive for abdominal pain. Negative for constipation, diarrhea, nausea and vomiting.  Genitourinary: Negative for dysuria.  Musculoskeletal: Negative for joint pain.  Neurological: Negative for dizziness and headaches.   Exam: Physical Exam  Constitutional: He is oriented to person, place, and time.  HENT:  Nose: No mucosal edema.  Mouth/Throat: No oropharyngeal exudate or posterior oropharyngeal edema.  Eyes: Pupils are equal, round, and reactive to light. Conjunctivae, EOM and lids are normal.  Neck: No JVD present. Carotid bruit is not present. No edema present. No thyroid mass and no thyromegaly present.  Cardiovascular: S1 normal and S2 normal. Exam reveals no gallop.  No murmur heard. Pulses:      Dorsalis pedis pulses are 2+ on the right side and 2+ on the left side.  Respiratory: No respiratory distress. He has no wheezes. He has no rhonchi. He has no rales.  GI: Soft. Bowel sounds are normal. There is no abdominal tenderness.  Musculoskeletal:     Right ankle: He exhibits no swelling.     Left  ankle: He exhibits no swelling.  Lymphadenopathy:    He has no cervical adenopathy.  Neurological: He is alert and oriented to person, place, and time. No cranial nerve deficit.  Skin: Skin is warm. No rash noted. Nails show no clubbing.  Psychiatric: He has a normal mood and affect.      Data Reviewed: Basic Metabolic Panel: Recent Labs  Lab 05/28/18 1654 05/29/18 0434 05/30/18 0649  NA 137 139 140  K 4.4 3.5 3.8  CL 106 110 113*  CO2 23 22 22   GLUCOSE 154* 109* 106*  BUN 28* 27* 21  CREATININE 2.32* 2.12* 1.85*  CALCIUM 8.3* 8.0* 7.9*   Liver Function Tests: Recent Labs  Lab 05/28/18 1654  AST 15  ALT 10  ALKPHOS 93  BILITOT 0.1*  PROT 6.3*  ALBUMIN 2.4*   Recent Labs  Lab 05/28/18 1654  LIPASE 42   CBC: Recent Labs  Lab 05/28/18 1654 05/29/18 0434  WBC 10.2 8.0  HGB 10.0* 8.8*  HCT 31.1* 27.5*  MCV 91.2 90.5  PLT 431* 373   Cardiac Enzymes: Recent Labs  Lab 05/29/18 0454  TROPONINI <0.03   BNP (last 3 results) Recent Labs    11/19/17 0937  BNP 457.0*      Recent Results (from the past 240 hour(s))  SARS Coronavirus 2 (CEPHEID - Performed in [place] hospital lab), Hosp Order     Status: None   Collection Time: 05/28/18  7:23 PM  Result Value Ref Range  Status   SARS Coronavirus 2 NEGATIVE NEGATIVE Final    Comment: (NOTE) If result is NEGATIVE SARS-CoV-2 target nucleic acids are NOT DETECTED. The SARS-CoV-2 RNA is generally detectable in upper and lower  respiratory specimens during the acute phase of infection. The lowest  concentration of SARS-CoV-2 viral copies this assay can detect is 250  copies / mL. A negative result does not preclude SARS-CoV-2 infection  and should not be used as the sole basis for treatment or other  patient management decisions.  A negative result may occur with  improper specimen collection / handling, submission of specimen other  than nasopharyngeal swab, presence of viral mutation(s) within the   areas targeted by this assay, and inadequate number of viral copies  (<250 copies / mL). A negative result must be combined with clinical  observations, patient history, and epidemiological information. If result is POSITIVE SARS-CoV-2 target nucleic acids are DETECTED. The SARS-CoV-2 RNA is generally detectable in upper and lower  respiratory specimens dur ing the acute phase of infection.  Positive  results are indicative of active infection with SARS-CoV-2.  Clinical  correlation with patient history and other diagnostic information is  necessary to determine patient infection status.  Positive results do  not rule out bacterial infection or co-infection with other viruses. If result is PRESUMPTIVE POSTIVE SARS-CoV-2 nucleic acids MAY BE PRESENT.   A presumptive positive result was obtained on the submitted specimen  and confirmed on repeat testing.  While 2019 novel coronavirus  (SARS-CoV-2) nucleic acids may be present in the submitted sample  additional confirmatory testing may be necessary for epidemiological  and / or clinical management purposes  to differentiate between  SARS-CoV-2 and other Sarbecovirus currently known to infect humans.  If clinically indicated additional testing with an alternate test  methodology [phone]) is advised. The SARS-CoV-2 RNA is generally  detectable in upper and lower respiratory sp ecimens during the acute  phase of infection. The expected result is Negative. Fact Sheet for Patients:  StrictlyIdeas.no Fact Sheet for Healthcare Providers: BankingDealers.co.za This test is not yet approved or cleared by the [place] FDA and has been authorized for detection and/or diagnosis of SARS-CoV-2 by FDA under an Emergency Use Authorization (EUA).  This EUA will remain in effect (meaning this test can be used) for the duration of the COVID-19 declaration under Section 564(b)(1) of the Act, 21  U.S.C. section 360bbb-3(b)(1), unless the authorization is terminated or revoked sooner. Performed at [facility], [place], [place] [address]   C Difficile Quick Screen w PCR reflex     Status: Abnormal   Collection Time: 05/29/18  1:23 PM  Result Value Ref Range Status   C Diff antigen POSITIVE (A) NEGATIVE Final   C Diff toxin POSITIVE (A) NEGATIVE Final   C Diff interpretation Toxin producing C. difficile detected.  Final    CommentLarwance Rote AT 1433 05/29/2018 KMP Performed at [facility], [address] [place], [place] [address]       Scheduled Meds: . calcium-vitamin D  1 tablet Oral BID  . carvedilol  6.25 mg Oral BID WC  . feeding supplement (ENSURE ENLIVE)  237 mL Oral BID BM  . folic acid  1 mg Intravenous Daily  . multivitamin with minerals  1 tablet Oral Daily  . pantoprazole (PROTONIX) IV  40 mg Intravenous Q12H  . sodium chloride flush  3 mL Intravenous Q12H  . tamsulosin  0.4 mg Oral Daily  .  thiamine  100 mg Oral Daily   Or  . thiamine  100 mg Intravenous Daily  . vancomycin  125 mg Oral QID   Continuous Infusions:  Assessment/Plan:  1. C. difficile colitis.  Patient with abdominal pain and diarrhea.  Diarrhea has improved.  On p.o. vancomycin. 2. Hypertension.  Patient on low-dose Coreg.  Discontinue Norvasc. 3. Recent surgery for bleeding duodenal ulcer.  JP drain removed.  Feeding tube not being used.  Seen by surgery.  Continue PPI 4. History of alcohol abuse on thiamine 5. Physical therapy evaluation  Code Status:     Code Status Orders  (From admission, onward)         Start     Ordered   05/29/18 0016  Do not attempt resuscitation (DNR)  Continuous    Question Answer Comment  In the event of cardiac or respiratory ARREST Do not call a "code blue"   In the event of cardiac or respiratory ARREST Do not perform Intubation, CPR, defibrillation or ACLS   In the event of cardiac or respiratory  ARREST Use medication by any route, position, wound care, and other measures to relive pain and suffering. May use oxygen, suction and manual treatment of airway obstruction as needed for comfort.   Comments per patient's only child the patient is to be DNR per his father's prior expressed wishes. Still ok to receive blood, antibiotics, and other care but NO intubation and NO CPR      05/29/18 0015        Code Status History    Date Active Date Inactive Code Status Order ID Comments User Context   05/29/2018 0016 05/29/2018 0016 DNR [id number]  Mayer Camel, NP ED   05/02/2018 1427 05/26/2018 2140 DNR [id number]  Vaughan Basta, MD Inpatient   05/02/2018 0804 05/02/2018 1427 DNR [id number]  Delman Kitten, MD ED   03/13/2018 1929 03/14/2018 2030 Full Code [id number]  Nena Polio, MD ED   11/30/2017 1846 12/01/2017 2040 Full Code [id number]  Thornton Park, MD Inpatient   11/30/2017 1601 11/30/2017 1846 Full Code [id number]  Demetrios Loll, MD Inpatient     Family Communication: Spoke with son on the phone. Disposition Plan: Son told me that the group home will not take him back.  Still waiting on confirmation of this with Education officer, museum.  Will put in physical therapy consultation to see what his strength is like.  Time spent: 28 minutes  [place]

## 2018-06-02 NOTE — Progress Notes (Signed)
Pt complains of inability to sleep.  Pt has been given tylenol, trazodone, ultram and xanax throughout this shift and still complains of insomnia.  Received order for benadryl 25 mg po x 1. Encouraged pt to relax, turned down lights and covered pt with warm blanket. Dorna Bloom RN

## 2018-06-03 ENCOUNTER — Encounter: Payer: Self-pay | Admitting: Psychiatry

## 2018-06-03 DIAGNOSIS — R404 Transient alteration of awareness: Secondary | ICD-10-CM

## 2018-06-03 MED ORDER — ENSURE MAX PROTEIN PO LIQD
11.0000 [oz_av] | Freq: Two times a day (BID) | ORAL | Status: DC
  Administered 2018-06-03 – 2018-06-11 (×4): 11 [oz_av] via ORAL
  Filled 2018-06-03: qty 330

## 2018-06-03 MED ORDER — TRAMADOL HCL 50 MG PO TABS: 50.0000 mg | ORAL_TABLET | Freq: Two times a day (BID) | ORAL | 0 refills | Status: DC | PRN

## 2018-06-03 MED ORDER — ALPRAZOLAM 0.25 MG PO TABS: 0.2500 mg | ORAL_TABLET | Freq: Two times a day (BID) | ORAL | 0 refills | Status: DC | PRN

## 2018-06-03 MED ORDER — TAMSULOSIN HCL 0.4 MG PO CAPS: 0.4000 mg | ORAL_CAPSULE | Freq: Every day | ORAL | 0 refills | Status: DC

## 2018-06-03 MED ORDER — TRAZODONE HCL 50 MG PO TABS: 50.0000 mg | ORAL_TABLET | Freq: Every evening | ORAL | 0 refills | Status: DC | PRN

## 2018-06-03 MED ORDER — ALPRAZOLAM 0.25 MG PO TABS
0.2500 mg | ORAL_TABLET | Freq: Two times a day (BID) | ORAL | Status: DC | PRN
  Administered 2018-06-03 – 2018-06-17 (×23): 0.25 mg via ORAL
  Filled 2018-06-03 (×23): qty 1

## 2018-06-03 MED ORDER — PANTOPRAZOLE SODIUM 40 MG PO TBEC: 40.0000 mg | DELAYED_RELEASE_TABLET | Freq: Two times a day (BID) | ORAL | 1 refills | Status: DC

## 2018-06-03 MED ORDER — FOLIC ACID 1 MG PO TABS
1.0000 mg | ORAL_TABLET | Freq: Every day | ORAL | Status: DC
  Administered 2018-06-04 – 2018-06-17 (×14): 1 mg via ORAL
  Filled 2018-06-03 (×13): qty 1

## 2018-06-03 MED ORDER — VANCOMYCIN HCL 125 MG PO CAPS: 125.0000 mg | ORAL_CAPSULE | Freq: Four times a day (QID) | ORAL | 0 refills | Status: DC

## 2018-06-03 MED ORDER — MIRTAZAPINE 15 MG PO TABS
15.0000 mg | ORAL_TABLET | Freq: Every day | ORAL | Status: DC
  Administered 2018-06-03 – 2018-06-15 (×13): 15 mg via ORAL
  Filled 2018-06-03 (×13): qty 1

## 2018-06-03 NOTE — Evaluation (Addendum)
Physical Therapy Evaluation Patient Details Name: Justin Andrews MRN: [id number] DOB: Oct 13, 1934 Today's Date: 06/03/2018   History of Present Illness   84 yo male s/p exlap, repair of bleeding duodenal ulcer, duodenoplasty, and feeding J tube on 5/1.  Was discharged to group home on 5/19 after hospital stay and presented to ED soon after with abdominal pain and diarrhea.  Has RUQ JP drain and LUQ feeding J-tube.  Per chart drain has been removed, feeding tube still in place.     Clinical Impression  Pt previously screened by PT, however MD requesting formal PT evaluation to assist with discharge planning. Pt reported that he previously stayed in a home that he was not happy with, and would be staying with a friend instead when he left the hospital, but unable to provide friend name of home information. Per chart, pt previously from group home. Pt also reported that he does no use an AD at baseline and is independent in ADLs/IADLs, but does not drive.  Pt behavior and orientation functional throughout session. HOH but alert, oriented. Pt performed all bed mobility, transfers, and ambulation independently. Strength grossly WFLs for both UE and LE. Higher level balance assessed pt able to perform without physical assist and mild to no sway noted. 5 times sit to stand outcome measure utilized, pt able to complete in 15 seconds, indicating low fall risk, and functional lower extremity strength. The patient demonstrated and reported return to baseline level of functioning, no further acute PT needs indicated. PT to sign off. Please reconsult PT if pt status changes or acute needs are identified.      Follow Up Recommendations No PT follow up    Equipment Recommendations  None recommended by PT    Recommendations for Other Services       Precautions / Restrictions Restrictions Weight Bearing Restrictions: No      Mobility  Bed Mobility               General bed mobility comments: sitting  EOB at start of session  Transfers Overall transfer level: Independent   Transfers: Sit to/from Stand Sit to Stand: Independent         General transfer comment: pt able to stand from bed independently  Ambulation/Gait Ambulation/Gait assistance: Independent Gait Distance (Feet): 20 Feet Assistive device: None       General Gait Details: Pt with narrow base of support while walking, but overall steady/safe.   Stairs            Wheelchair Mobility    Modified Rankin (Stroke Patients Only)       Balance Overall balance assessment: Modified Independent Sitting-balance support: Feet supported Sitting balance-Leahy Scale: Normal       Standing balance-Leahy Scale: Good           Rhomberg - Eyes Opened: 20     High Level Balance Comments: 1 step forward bilaterally, steady, safe no LOB or sway noted. No difficulty with rhomberg testing position              Pertinent Vitals/Pain Pain Assessment: No/denies pain    Home Living Family/patient expects to be discharged to:: Private residence Living Arrangements: (Pt expects to discharge to his friends house, but previously stayed at a group home)                    Prior Function           Comments: Pt reported that he does not  use an AD at baseline. Reported he is not going home/back to the group home because he did not like it there     Hand Dominance        Extremity/Trunk Assessment   Upper Extremity Assessment Upper Extremity Assessment: (grossly 4/5 bilaterally)    Lower Extremity Assessment Lower Extremity Assessment: (grossly 4/bilaterally)    Cervical / Trunk Assessment Cervical / Trunk Assessment: Normal  Communication   Communication: HOH  Cognition Arousal/Alertness: Awake/alert Behavior During Therapy: WFL for tasks assessed/performed Overall Cognitive Status: Within Functional Limits for tasks assessed                                 General  Comments: some impulsivity noted      General Comments      Exercises Other Exercises Other Exercises: 5 times sit to stand outcome measure; pt able to perform 5 sit to stands in 15 seconds, pt uses hands to assist with control   Assessment/Plan    PT Assessment Patent does not need any further PT services  PT Problem List Decreased strength;Decreased balance       PT Treatment Interventions      PT Goals (Current goals can be found in the Care Plan section)  Acute Rehab PT Goals Patient Stated Goal: to get outside and smell the air PT Goal Formulation: With patient    Frequency     Barriers to discharge        Co-evaluation               AM-PAC PT "6 Clicks" Mobility  Outcome Measure Help needed turning from your back to your side while in a flat bed without using bedrails?: None Help needed moving from lying on your back to sitting on the side of a flat bed without using bedrails?: None Help needed moving to and from a bed to a chair (including a wheelchair)?: None Help needed standing up from a chair using your arms (e.g., wheelchair or bedside chair)?: None Help needed to walk in hospital room?: None Help needed climbing 3-5 steps with a railing? : A Little 6 Click Score: 23    End of Session   Activity Tolerance: Patient tolerated treatment well Patient left: in chair Nurse Communication: Mobility status(Pt aware pt has been independent in room for mobility) PT Visit Diagnosis: Muscle weakness (generalized) (M62.81);Other abnormalities of gait and mobility (R26.89);Difficulty in walking, not elsewhere classified (R26.2)    Time: 1610-9604 PT Time Calculation (min) (ACUTE ONLY): 14 min   Charges:   PT Evaluation $PT Eval Low Complexity: 1 Low          Lieutenant Diego PT, DPT 3:03 PM,06/03/18 [phone]

## 2018-06-03 NOTE — Care Management Important Message (Signed)
Important Message  Patient Details  Name: Justin Andrews MRN: [id number] Date of Birth: 05-31-1934   Medicare Important Message Given:  Yes    Su Hilt, RN 06/03/2018, 9:41 AM

## 2018-06-03 NOTE — Consult Note (Signed)
[facility] Face-to-Face Psychiatry Consult   Reason for Consult:  Capacity evaluation Referring Physician:  Dr. Leslye Andrews Patient Identification: Justin Andrews MRN:  [id number] Principal Diagnosis: Abdominal pain Diagnosis:  Principal Problem:   Abdominal pain Active Problems:   Altered mental status  Patient seen, chart reviewed.  Collateral received from son, Justin Andrews Total Time spent with patient: 1 hour  Subjective: "I am depressed, I want to get a running start and jump out of this window in order to end my life."  HPI: Patient admitted on 05/28/2018 Justin Andrews is a 84 y.o. male patient  with a known history of hypertension, GERD, CHF, history of alcoholism.  He is status post exploratory laparoscopy with duodenoplasty with J-tube placement and JP drain on 05/08/2018.  Patient had originally been admitted for large GI bleed with perforated duodenal ulcer.  He was discharged on 05/26/18.  During the hospitalization, patient was treated for pneumonia and completed course of antibiotics.  He returned to the emergency room complaining of increased abdominal pain with the ball of having become disconnected from the JP drain which patient reported occurred 48 hours prior to his arrival.  Patient denies experiencing shortness of breath, chest pain, nausea, vomiting, diarrhea.  He has noted no fevers or chills.  Patient denies diarrhea.  He reports his last bowel movement was 3 days ago.  He denies hematemesis, hematochezia, or melena.  Patient reports his last alcoholic intake was about 1 month ago with 1 pint vodka at that time.  Rapid COVID-19 testing was completed in the emergency room and is negative.  General surgery was consulted, JustinJose Andrews who reviewed the CT abdomen noting some bubbles of air around the liver dome however no large pneumoperitoneum seen.  There are postop changes with inflammation around the repair site however no colonic wall thickening.  Although there is evidence of free air on  CT scan, this is likely explained by the fact that the patient's JP drain has not been connected to bulb suction allowing air to get into the abdomen through the drain.  Recommendations to keep the patient n.p.o. precautionary and follow-up results of C. difficile testing.  Patient has been started on IV Protonix. Troponin is 0.08 on arrival.  We will continue serial troponin.  No leukocytosis identified.  Hemoglobin is 10.0 with hematocrit 31.1.  Psychiatry consult is requested for capacity evaluation prior to patient's discharge. Patient is essentially homeless, but stating can manage his affairs on his own.   Past Psychiatric History: Alcohol and drug abuse; depression; patient has never been seen by outpatient psychiatrist or therapist.  Risk to Self:  yes, making threats to kill self  Risk to Others:  no Prior Inpatient Therapy:  none Prior Outpatient Therapy:  Justin Andrews, PCP has been encouraging patient to seek psychiatric care due to chronic suicidal thoughts.  On evaluation, patient endorses that he is depressed and wanting to end his life.  He reports he has had a poor appetite and poor sleep for some time now.  He feels isolated, and has little interest in engaging with others.  Describes low energy, low concentration.  He notes that is been difficult for him to do things for himself.  He does endorse that he drinks socially, and admits to smoking 1 pack/day.  He states he had a pack of cigarettes on his windowsill here in the hospital that he was using as a deterrent to quit smoking, but he believes that somebody took these from his room.  He states that now that he cannot see his cigarettes he is craving cigarettes.  He does not wish to have a nicotine patch or nicotine gum as he reports these are not helpful for him.  Patient is unable to state the correct date, reports year as 2000, and then corrects himself to state 2001.  When told that his 2020, he relates, "all my Justin Andrews have lost a  lot of time."  He does state the month correctly.  Patient endorses that he has spoken with a friend who he said will meet him at the hospital at 6 PM to him up.  When asked friend's name or phone number, he is unable to give this information, but states that his son knows this person and will drop him off at his house.  On test of recall, patient does have immediate recall as well as significant delayed recall 10 minutes after full conversation.  Patient states that he does not drive.  He relates that he cannot find people to buy groceries for him when he needs to.  He states he is independent in his self-care.  Patient describes he has been feeling suicidal for some time, he thinks about driving off of bridges, jumping from heights.  He denies that he has access to weapons.  Patient denies homicidal ideation.  He denies auditory and visual hallucinations, although possibly has been having hallucinations while in the hospital.  Collateral is obtained from patient's son Justin Andrews): Son describes that patient has called him since he has been in the hospital and reports that he is home, and the store, and is disoriented to date.  He relates that his father has been reporting that he has been talking to relatives.  Son reports that he does not know this friend that his father was speaking about. When he confronted father about where he intended to stay,  He told son that he would stay in a motel.  Son reports that father has not been able to care for himself.  He spends his money on alcohol, and has other people by his alcohol for him.  Son states that patient cannot live at his home due to his children living in the home.  He does express concern for fathers depression and safety.   Past Medical History:  Past Medical History:  Diagnosis Date  . C. difficile diarrhea 11/2017  . Elevated serum creatinine   . History of melanoma   . Hypertension   . Osteoarthritis   . Tobacco abuse     Past Surgical  History:  Procedure Laterality Date  . ANKLE FRACTURE SURGERY    . CENTRAL VENOUS CATHETER INSERTION  05/08/2018   Procedure: INSERTION CENTRAL LINE ADULT;  Surgeon: Olean Ree, MD;  Location: [facility];  Service: General;;  . ESOPHAGOGASTRODUODENOSCOPY  05/08/2018   Procedure: ESOPHAGOGASTRODUODENOSCOPY (EGD);  Surgeon: Olean Ree, MD;  Location: [facility];  Service: General;;  . ESOPHAGOGASTRODUODENOSCOPY (EGD) WITH PROPOFOL N/A 05/03/2018   Procedure: ESOPHAGOGASTRODUODENOSCOPY (EGD) WITH PROPOFOL;  Surgeon: Toledo, Benay Pike, MD;  Location: [facility];  Service: Gastroenterology;  Laterality: N/A;  . EXTERNAL EAR SURGERY    . INTRAMEDULLARY (IM) NAIL INTERTROCHANTERIC Left 12/01/2017   Procedure: INTRAMEDULLARY (IM) NAIL INTERTROCHANTRIC;  Surgeon: Thornton Park, MD;  Location: [facility];  Service: Orthopedics;  Laterality: Left;  . JEJUNOSTOMY  05/08/2018   Procedure: JEJUNOSTOMY;  Surgeon: Olean Ree, MD;  Location: [facility];  Service: General;;  . LAPAROTOMY N/A 05/08/2018   Procedure: EXPLORATORY LAPAROTOMY;  Surgeon: Olean Ree, MD;  Location: [facility];  Service: General;  Laterality: N/A;  . ROTATOR CUFF REPAIR    . TONSILLECTOMY    . TYMPANOPLASTY W/ MASTOIDECTOMY     Patient with reports of mastoid surgery (appears to have had surgery on TM; Left).  . VISCERAL ARTERY INTERVENTION N/A 05/04/2018   Procedure: VISCERAL ARTERY INTERVENTION;  Surgeon: Algernon Huxley, MD;  Location: [place] [facility];  Service: Cardiovascular;  Laterality: N/A;   Family History:  Family History  Problem Relation Age of Onset  . Prostate cancer Neg Hx   . Bladder Cancer Neg Hx   . Kidney cancer Neg Hx    Family Psychiatric  History: None stated  Social History:  Social History   Substance and Sexual Activity  Alcohol Use No     Social History   Substance and Sexual Activity  Drug Use No    Social History   Socioeconomic History  . Marital status: Divorced    Spouse name: Not  on file  . Number of children: Not on file  . Years of education: Not on file  . Highest education level: Not on file  Occupational History  . Not on file  Social Needs  . Financial resource strain: Not hard at all  . Food insecurity:    Worry: Never true    Inability: Never true  . Transportation needs:    Medical: No    Non-medical: No  Tobacco Use  . Smoking status: Current Every Day Smoker    Packs/day: 1.00    Years: 70.00    Pack years: 70.00    Types: Cigarettes  . Smokeless tobacco: Never Used  Substance and Sexual Activity  . Alcohol use: No  . Drug use: No  . Sexual activity: Never  Lifestyle  . Physical activity:    Days per week: 1 day    Minutes per session: 90 min  . Stress: Not at all  Relationships  . Social connections:    Talks on phone: Not on file    Gets together: Not on file    Attends religious service: Not on file    Active member of club or organization: Not on file    Attends meetings of clubs or organizations: Not on file    Relationship status: Not on file  Other Topics Concern  . Not on file  Social History Narrative  . Not on file   Additional Social History:  Lived alone in a boarding house for a couple of months, prior to presenting with medical complications.  Does not drive He does own self care, however was not caring for self.  He had been discharged to a group home but he had been dismissed to needing "a higher level of care".  Patient drinks 1/5 vodka every 1-2 days (people from boarding house would buy for him). Patient abusing tramadol.   Allergies:   Allergies  Allergen Reactions  . Aspirin Other (See Comments)    bleeding    Labs: No results found for this or any previous visit (from the past 78 hour(s)).  Current Facility-Administered Medications  Medication Dose Route Frequency Provider Last Rate Last Dose  . acetaminophen (TYLENOL) tablet 650 mg  650 mg Oral Q6H PRN Seals, Theo Dills, NP   650 mg at 06/02/18  2216   Or  . acetaminophen (TYLENOL) suppository 650 mg  650 mg Rectal Q6H PRN Seals, Theo Dills, NP      . ALPRAZolam (  XANAX) tablet 0.25 mg  0.25 mg Oral BID PRN Loletha Grayer, MD   0.25 mg at 06/03/18 1329  . calcium-vitamin D (OSCAL WITH D) 500-200 MG-UNIT per tablet 1 tablet  1 tablet Oral BID Mayer Camel, NP   1 tablet at 06/03/18 [phone]  . [START ON 9/76/7341] folic acid (FOLVITE) tablet 1 mg  1 mg Oral Daily Wieting, Richard, MD      . ipratropium-albuterol (DUONEB) 0.5-2.5 (3) MG/3ML nebulizer solution 3 mL  3 mL Nebulization Q6H PRN Seals, Theo Dills, NP      . multivitamin with minerals tablet 1 tablet  1 tablet Oral Daily Seals, Theo Dills, NP   1 tablet at 06/03/18 [phone]  . ondansetron (ZOFRAN) tablet 4 mg  4 mg Oral Q6H PRN Seals, Theo Dills, NP   4 mg at 06/01/18 2016   Or  . ondansetron (ZOFRAN) injection 4 mg  4 mg Intravenous Q6H PRN Seals, Theo Dills, NP      . pantoprazole (PROTONIX) EC tablet 40 mg  40 mg Oral BID Loletha Grayer, MD   40 mg at 06/03/18 0240  . polyethylene glycol (MIRALAX / GLYCOLAX) packet 17 g  17 g Oral Daily PRN Seals, Levada Dy H, NP      . protein supplement (ENSURE MAX) liquid  11 oz Oral BID BM Loletha Grayer, MD   11 oz at 06/03/18 1330  . sodium chloride flush (NS) 0.9 % injection 3 mL  3 mL Intravenous Q12H Seals, Angela H, NP   3 mL at 06/03/18 0943  . tamsulosin (FLOMAX) capsule 0.4 mg  0.4 mg Oral Daily Seals, Angela H, NP   0.4 mg at 06/03/18 9735  . thiamine (VITAMIN B-1) tablet 100 mg  100 mg Oral Daily Seals, Angela H, NP   100 mg at 06/03/18 3299   Or  . thiamine (B-1) injection 100 mg  100 mg Intravenous Daily Seals, Levada Dy H, NP      . traMADol (ULTRAM) tablet 50 mg  50 mg Oral Q6H PRN Hillary Bow, MD   50 mg at 06/03/18 1329  . traZODone (DESYREL) tablet 50 mg  50 mg Oral QHS PRN Mayer Camel, NP   50 mg at 06/02/18 1950  . vancomycin (VANCOCIN) 50 mg/mL oral solution 125 mg  125 mg Oral QID Hillary Bow, MD   125 mg at 06/03/18 1329     Musculoskeletal: Strength & Muscle Tone: within normal limits Gait & Station: normal Patient leans: N/A  Psychiatric Specialty Exam: Physical Exam  Constitutional: He appears well-developed. He appears distressed.  HENT:  Head: Normocephalic and atraumatic.  Eyes: EOM are normal.  Neck: Normal range of motion.  Cardiovascular: Normal rate and regular rhythm.  Respiratory: Effort normal. No respiratory distress.  Musculoskeletal: Normal range of motion.  Neurological: He is alert.    Review of Systems  Constitutional: Negative.   HENT: Negative.   Respiratory: Negative.   Cardiovascular: Negative.   Gastrointestinal: Positive for abdominal pain and diarrhea.  Genitourinary: Negative.   Musculoskeletal: Negative.   Neurological: Negative.   Psychiatric/Behavioral: Positive for depression, hallucinations, memory loss, substance abuse and suicidal ideas. The patient is nervous/anxious and has insomnia.     Blood pressure 113/66, pulse 81, temperature 98.3 F (36.8 C), temperature source Oral, resp. rate 18, height 5\' 8"  (1.727 m), weight 54.9 kg, SpO2 98 %.Body mass index is 18.4 kg/m.  General Appearance: Casual  Eye Contact:  Good  Speech:  Clear and Coherent and Normal Rate  Volume:  Normal  Mood:  Anxious and Dysphoric  Affect:  Congruent  Thought Process:  Goal Directed  Orientation:  Other:  Person, place  Thought Content:  Illogical and Hallucinations: Visual suspected  Suicidal Thoughts:  Yes.  with intent/plan stated, however no history of self-harm or suicide attempt  Homicidal Thoughts:  No  Memory:  Fair  Judgement:  Impaired  Insight:  Shallow  Psychomotor Activity:  Restlessness  Concentration:  Concentration: Fair  Recall:  Good  Fund of Knowledge:  Fair  Language:  Good  Akathisia:  No  Handed:  Right  AIMS (if indicated):     Assets:  Agricultural consultant Social Support  ADL's:  Intact  Cognition:  WNL  Sleep:    decreased    Treatment Plan Summary: NASIM HABEEB is a 84 y.o. male for capacity evaluation.patient did not have capacity when I talked with him,  and per son, there is not a friend with whom he can stay.   Daily contact with patient to assess and evaluate symptoms and progress in treatment and Medication management  Start Remeron 15 mg at bedtime for depression  Disposition: Supportive therapy provided about ongoing stressors.  Psychiatry will continue to re-evaluate and assist in coordination for discharge planning/  Lavella Hammock, MD 06/03/2018 1:51 PM

## 2018-06-03 NOTE — Progress Notes (Signed)
Nutrition Follow-up  RD working remotely.  DOCUMENTATION CODES:   Underweight  INTERVENTION:  Will discontinue Ensure Enlive per patient request.  Provide Ensure Max Protein po BID, each supplement provides 150 kcal and 30 grams of protein.  Provide Magic cup TID with meals, each supplement provides 290 kcal and 9 grams of protein.  Continue daily MVI, thiamine 600 mg daily, folic acid 1 mg daily.  NUTRITION DIAGNOSIS:   Inadequate oral intake related to acute illness as evidenced by other (comment)(per chart review ).  Ongoing.  GOAL:   Patient will meet greater than or equal to 90% of their needs  Progressing.  MONITOR:   Diet advancement, Labs, Weight trends, Skin, I & O's, TF tolerance  REASON FOR ASSESSMENT:   Malnutrition Screening Tool    ASSESSMENT:   84 y/o male with h/o HTN, OA, etoh abuse, CHF s/p exlap, repair of bleeding duodenal ulcer, duodenoplasty, and J tube placement on 5/1. Was discharged to group home on 5/19 and now returns with abdominal pain and diarrhea.    Spoke with patient over the phone. He reports he does not like the food here so he is not eating very well. He reports that he ate about 45% of his breakfast this morning. He is tired of the Ensure and does not want to drink them anymore. He is amenable to trying a different ONS. Patient reports he is likely discharging today and he feels he will eat better after discharge.  Medications reviewed and include: Oscal with D BID, folic acid 1 mg daily IV, MVI daily, pantoprazole, Flomax, thiamine 100 mg daily, vancomycin.  Labs reviewed: Chloride 113, Creatinine 1.85.  Diet Order:   Diet Order            Diet regular Room service appropriate? Yes; Fluid consistency: Thin  Diet effective now             EDUCATION NEEDS:   Not appropriate for education at this time  Skin:  Skin Assessment: Reviewed RN Assessment(incision abdomen )  Last BM:  06/02/2018 - medium type 6  Height:   Ht  Readings from Last 1 Encounters:  05/29/18 5\' 8"  (1.727 m)   Weight:   Wt Readings from Last 1 Encounters:  05/29/18 54.9 kg   Ideal Body Weight:  70 kg  BMI:  Body mass index is 18.4 kg/m.  Estimated Nutritional Needs:   Kcal:  1600-1800kcal/day   Protein:  80-90g/day   Fluid:  >1.4L/day   Willey Blade, MS, RD, LDN Office: [phone] Pager: [phone] After Hours/Weekend Pager: [phone]

## 2018-06-03 NOTE — Progress Notes (Signed)
Patient ID: JENARO SOUDER, male   DOB: [birth year], 84 y.o.   MRN: [id number]  [facility] PROGRESS NOTE  ANTINO MAYABB HUD:[id number] DOB: 1934/06/19 DOA: 05/28/2018 PCP: Leone Haven, MD  HPI/Subjective: Patient feeling fine and wants to get out of the hospital.  No diarrhea.  Some abdominal discomfort at the little spot of prior surgery.  Patient very anxious about being in the hospital.  Patient told me that his son was going to pick him up and bring him to a friend's house.  He can even name the friend.  The son thinks he needs long-term care and cannot take care of himself.  The patient's nurse today was concerned about him making decisions.  Objective: Vitals:   06/02/18 2030 06/03/18 0527  BP: (!) 106/56 113/66  Pulse: 89 81  Resp: 20 18  Temp: 97.7 F (36.5 C) 98.3 F (36.8 C)  SpO2: 97% 98%   No intake or output data in the 24 hours ending 06/03/18 1336 Filed Weights   05/28/18 1638 05/29/18 0615  Weight: 49.9 kg 54.9 kg    ROS: Review of Systems  Constitutional: Negative for chills and fever.  Eyes: Negative for blurred vision.  Respiratory: Negative for cough and shortness of breath.   Cardiovascular: Negative for chest pain.  Gastrointestinal: Positive for abdominal pain. Negative for constipation, diarrhea, nausea and vomiting.  Genitourinary: Negative for dysuria.  Musculoskeletal: Negative for joint pain.  Neurological: Negative for dizziness and headaches.   Exam: Physical Exam  Constitutional: He is oriented to person, place, and time.  HENT:  Nose: No mucosal edema.  Mouth/Throat: No oropharyngeal exudate or posterior oropharyngeal edema.  Eyes: Pupils are equal, round, and reactive to light. Conjunctivae, EOM and lids are normal.  Neck: No JVD present. Carotid bruit is not present. No edema present. No thyroid mass and no thyromegaly present.  Cardiovascular: S1 normal and S2 normal. Exam reveals no gallop.  No murmur heard. Pulses:       Dorsalis pedis pulses are 2+ on the right side and 2+ on the left side.  Respiratory: No respiratory distress. He has no wheezes. He has no rhonchi. He has no rales.  GI: Soft. Bowel sounds are normal. There is abdominal tenderness.  Small spot on the right abdomen where prior port site was for the surgery has a little inflammation there.  Musculoskeletal:     Right ankle: He exhibits no swelling.     Left ankle: He exhibits no swelling.  Lymphadenopathy:    He has no cervical adenopathy.  Neurological: He is alert and oriented to person, place, and time. No cranial nerve deficit.  Skin: Skin is warm. No rash noted. Nails show no clubbing.  Psychiatric: He has a normal mood and affect.      Data Reviewed: Basic Metabolic Panel: Recent Labs  Lab 05/28/18 1654 05/29/18 0434 05/30/18 0649  NA 137 139 140  K 4.4 3.5 3.8  CL 106 110 113*  CO2 23 22 22   GLUCOSE 154* 109* 106*  BUN 28* 27* 21  CREATININE 2.32* 2.12* 1.85*  CALCIUM 8.3* 8.0* 7.9*   Liver Function Tests: Recent Labs  Lab 05/28/18 1654  AST 15  ALT 10  ALKPHOS 93  BILITOT 0.1*  PROT 6.3*  ALBUMIN 2.4*   Recent Labs  Lab 05/28/18 1654  LIPASE 42   CBC: Recent Labs  Lab 05/28/18 1654 05/29/18 0434  WBC 10.2 8.0  HGB 10.0* 8.8*  HCT 31.1* 27.5*  MCV  91.2 90.5  PLT 431* 373   Cardiac Enzymes: Recent Labs  Lab 05/29/18 0454  TROPONINI <0.03   BNP (last 3 results) Recent Labs    11/19/17 0937  BNP 457.0*      Recent Results (from the past 240 hour(s))  SARS Coronavirus 2 (CEPHEID - Performed in [place] hospital lab), Hosp Order     Status: None   Collection Time: 05/28/18  7:23 PM  Result Value Ref Range Status   SARS Coronavirus 2 NEGATIVE NEGATIVE Final    Comment: (NOTE) If result is NEGATIVE SARS-CoV-2 target nucleic acids are NOT DETECTED. The SARS-CoV-2 RNA is generally detectable in upper and lower  respiratory specimens during the acute phase of infection. The lowest   concentration of SARS-CoV-2 viral copies this assay can detect is 250  copies / mL. A negative result does not preclude SARS-CoV-2 infection  and should not be used as the sole basis for treatment or other  patient management decisions.  A negative result may occur with  improper specimen collection / handling, submission of specimen other  than nasopharyngeal swab, presence of viral mutation(s) within the  areas targeted by this assay, and inadequate number of viral copies  (<250 copies / mL). A negative result must be combined with clinical  observations, patient history, and epidemiological information. If result is POSITIVE SARS-CoV-2 target nucleic acids are DETECTED. The SARS-CoV-2 RNA is generally detectable in upper and lower  respiratory specimens dur ing the acute phase of infection.  Positive  results are indicative of active infection with SARS-CoV-2.  Clinical  correlation with patient history and other diagnostic information is  necessary to determine patient infection status.  Positive results do  not rule out bacterial infection or co-infection with other viruses. If result is PRESUMPTIVE POSTIVE SARS-CoV-2 nucleic acids MAY BE PRESENT.   A presumptive positive result was obtained on the submitted specimen  and confirmed on repeat testing.  While 2019 novel coronavirus  (SARS-CoV-2) nucleic acids may be present in the submitted sample  additional confirmatory testing may be necessary for epidemiological  and / or clinical management purposes  to differentiate between  SARS-CoV-2 and other Sarbecovirus currently known to infect humans.  If clinically indicated additional testing with an alternate test  methodology [phone]) is advised. The SARS-CoV-2 RNA is generally  detectable in upper and lower respiratory sp ecimens during the acute  phase of infection. The expected result is Negative. Fact Sheet for Patients:  StrictlyIdeas.no Fact Sheet  for Healthcare Providers: BankingDealers.co.za This test is not yet approved or cleared by the [place] FDA and has been authorized for detection and/or diagnosis of SARS-CoV-2 by FDA under an Emergency Use Authorization (EUA).  This EUA will remain in effect (meaning this test can be used) for the duration of the COVID-19 declaration under Section 564(b)(1) of the Act, 21 U.S.C. section 360bbb-3(b)(1), unless the authorization is terminated or revoked sooner. Performed at [facility], [place], [place] [address]   C Difficile Quick Screen w PCR reflex     Status: Abnormal   Collection Time: 05/29/18  1:23 PM  Result Value Ref Range Status   C Diff antigen POSITIVE (A) NEGATIVE Final   C Diff toxin POSITIVE (A) NEGATIVE Final   C Diff interpretation Toxin producing C. difficile detected.  Final    CommentLarwance Rote AT 1433 05/29/2018 KMP Performed at [facility], [address] [place], [place] [address]  Scheduled Meds: . calcium-vitamin D  1 tablet Oral BID  . [START ON 5/67/0141] folic acid  1 mg Oral Daily  . multivitamin with minerals  1 tablet Oral Daily  . pantoprazole  40 mg Oral BID  . Ensure Max Protein  11 oz Oral BID BM  . sodium chloride flush  3 mL Intravenous Q12H  . tamsulosin  0.4 mg Oral Daily  . thiamine  100 mg Oral Daily   Or  . thiamine  100 mg Intravenous Daily  . vancomycin  125 mg Oral QID   Continuous Infusions:  Assessment/Plan:  1. C. difficile colitis.  Patient with abdominal pain and diarrhea.  Diarrhea has improved.  On p.o. vancomycin. 2. Hypertension.  Blood pressure on the lower side I stopped Norvasc and Coreg. 3. Recent surgery for bleeding duodenal ulcer.  JP drain removed.  Feeding tube not being used.  Seen by surgery.  Continue PPI. 4. History of alcohol abuse on thiamine 5. Physical therapy evaluation 6. Nursing staff concerned about his capacity to  make good decisions.  We will get psychiatric consultation. 66. Spoke with son on the phone and he believes the patient needs long-term placement.  He states he will not have him in his house because he is a alcohol abuser and drug abuser.  Code Status:     Code Status Orders  (From admission, onward)         Start     Ordered   05/29/18 0016  Do not attempt resuscitation (DNR)  Continuous    Question Answer Comment  In the event of cardiac or respiratory ARREST Do not call a "code blue"   In the event of cardiac or respiratory ARREST Do not perform Intubation, CPR, defibrillation or ACLS   In the event of cardiac or respiratory ARREST Use medication by any route, position, wound care, and other measures to relive pain and suffering. May use oxygen, suction and manual treatment of airway obstruction as needed for comfort.   Comments per patient's only child the patient is to be DNR per his father's prior expressed wishes. Still ok to receive blood, antibiotics, and other care but NO intubation and NO CPR      05/29/18 0015        Code Status History    Date Active Date Inactive Code Status Order ID Comments User Context   05/29/2018 0016 05/29/2018 0016 DNR [id number]  Mayer Camel, NP ED   05/02/2018 1427 05/26/2018 2140 DNR [id number]  Vaughan Basta, MD Inpatient   05/02/2018 0804 05/02/2018 1427 DNR [id number]  Delman Kitten, MD ED   03/13/2018 1929 03/14/2018 2030 Full Code [id number]  Nena Polio, MD ED   11/30/2017 1846 12/01/2017 2040 Full Code [id number]  Thornton Park, MD Inpatient   11/30/2017 1601 11/30/2017 1846 Full Code [id number]  Demetrios Loll, MD Inpatient     Family Communication: Spoke with son on the phone. Disposition Plan: Son mentioned that he will not take him to his house and does not want him in his house.  Patient states that he will live with a friend but he cannot tell me the friend's name or where he lives.  Time spent: 35 minutes  [place]

## 2018-06-04 LAB — BASIC METABOLIC PANEL
Anion gap: 6 (ref 5–15)
BUN: 26 mg/dL — ABNORMAL HIGH (ref 8–23)
CO2: 25 mmol/L (ref 22–32)
Calcium: 8.4 mg/dL — ABNORMAL LOW (ref 8.9–10.3)
Chloride: 109 mmol/L (ref 98–111)
Creatinine, Ser: 1.81 mg/dL — ABNORMAL HIGH (ref 0.61–1.24)
GFR calc Af Amer: 39 mL/min — ABNORMAL LOW (ref >60)
GFR calc non Af Amer: 34 mL/min — ABNORMAL LOW (ref >60)
Glucose, Bld: 101 mg/dL — ABNORMAL HIGH (ref 70–99)
Potassium: 4.2 mmol/L (ref 3.5–5.1)
Sodium: 140 mmol/L (ref 135–145)

## 2018-06-04 NOTE — NC FL2 (Addendum)
[place] LEVEL OF CARE SCREENING TOOL     IDENTIFICATION  Patient Name: Justin Andrews Birthdate: Aug 30, 1934 Sex: male Admission Date (Current Location): 05/28/2018  [place] and [place] Number:  Engineering geologist and Address:  [facility], [address] [place], [place] [address]      Provider Number: [id number]  Attending Physician Name and Address:  Loletha Grayer, MD  Relative Name and Phone Number:       Current Level of Care: Hospital Recommended Level of Care: [place] Prior Approval Number:    Date Approved/Denied:   PASRR Number:    Discharge Plan: Other (Comment)(Family Care Home )    Current Diagnoses: Patient Active Problem List   Diagnosis Date Noted  . Abdominal pain 05/28/2018  . GI bleed 05/02/2018  . Hemorrhagic shock ([place]) 05/02/2018  . Altered mental status 05/02/2018  . Anemia due to blood loss, acute 05/02/2018  . RLS (restless legs syndrome) 04/22/2018  . Wrist pain 04/22/2018  . Right hip pain 04/08/2018  . Otitis media 03/19/2018  . Suicidal ideation 03/19/2018  . Protein-calorie malnutrition, severe 12/03/2017  . Closed left hip fracture ([place]) 11/30/2017  . Diarrhea 11/15/2017  . Duodenum disorder 11/15/2017  . Frequency of urination 10/21/2017  . Cigarette smoker 10/06/2017  . Cough 09/25/2017  . Changes in vision 09/25/2017  . Proteinuria 09/25/2017  . Basal cell carcinoma 02/28/2017  . 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 DOE (dyspnea on exertion) 01/17/2017  . Chronic right upper quadrant pain 11/15/2016  . Balance problem 11/15/2016  . Simple renal cyst 10/21/2016  . Prostate nodule 10/16/2016  . Dysuria 10/16/2016  . CKD (chronic kidney disease) stage 3, GFR 30-59 ml/min (HCC) 08/05/2016  . Anemia 08/05/2016  . Anxiety and depression 08/05/2016  . Weight loss 08/05/2016  . Sleep disturbance 07/23/2016  . Hypertension   . Osteoarthritis   . Tobacco abuse   .  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    Orientation RESPIRATION BLADDER Height & Weight     Self, Place  Normal Continent Weight: 121 lb (54.9 kg) Height:  5\' 8"  (172.7 cm)  BEHAVIORAL SYMPTOMS/MOOD NEUROLOGICAL BOWEL NUTRITION STATUS  (none) (none) Continent Diet(Regular )  AMBULATORY STATUS COMMUNICATION OF NEEDS Skin   Supervision Verbally Normal                       Personal Care Assistance Level of Assistance  Bathing, Feeding, Dressing Bathing Assistance: Limited assistance Feeding assistance: Independent Dressing Assistance: Independent     Functional Limitations Info  Sight, Hearing, Speech Sight Info: Adequate Hearing Info: Adequate Speech Info: Adequate    SPECIAL CARE FACTORS FREQUENCY                       Contractures Contractures Info: Not present    Additional Factors Info  Code Status, Allergies Code Status Info: DNR Allergies Info: Aspirin            TAKE these medications   ALPRAZolam 0.25 MG tablet Commonly known as:  XANAX Take 1 tablet (0.25 mg total) by mouth 2 (two) times daily as needed for anxiety.   calcium-vitamin D 500-200 MG-UNIT tablet Commonly known as:  OSCAL WITH D Take 1 tablet by mouth 2 (two) times daily.   feeding supplement (ENSURE ENLIVE) Liqd Take 237 mLs by mouth 2 (two) times daily between meals.   ferrous sulfate 325 (65 FE) MG  tablet Take 1 tablet (325 mg total) by mouth daily with breakfast.   folic acid 1 MG tablet Commonly known as:  FOLVITE Take 1 tablet (1 mg total) by mouth daily.   mirtazapine 30 MG tablet Commonly known as:  REMERON Take 0.5 tablets (15 mg total) by mouth at bedtime.   pantoprazole 40 MG tablet Commonly known as:  PROTONIX Take 1 tablet (40 mg total) by mouth daily. What changed:  when to take this   tamsulosin 0.4 MG Caps capsule Commonly known as:  FLOMAX Take 1 capsule (0.4 mg total) by mouth daily.   thiamine 100 MG tablet Take 1 tablet  (100 mg total) by mouth daily.   traMADol 50 MG tablet Commonly known as:  ULTRAM Take 1 tablet (50 mg total) by mouth every 12 (twelve) hours as needed for severe pain. What changed:    when to take this  reasons to take this   traZODone 50 MG tablet Commonly known as:  DESYREL Take 1 tablet (50 mg total) by mouth at bedtime as needed for up to 30 days for sleep.       Discharge Medications: Please see discharge summary for a list of discharge medications.  Relevant Imaging Results:  Relevant Lab Results:   Additional Information    Breckyn Ticas  Louretta Shorten, LCSWA

## 2018-06-04 NOTE — Progress Notes (Signed)
[facility] MD Progress Note  06/04/2018 9:57 AM ARMANIE MARTINE  MRN:  [id number] Subjective: ", I am not wanting to commit suicide.  Everybody has some back "in a while." Principal Problem: Abdominal pain Diagnosis: Principal Problem:   Abdominal pain Active Problems:   Altered mental status  Patient seen, chart reviewed.  Collateral received from son, [place]ola Depaz on 06/03/2018 Total Time spent with patient: 35 minutes  HPI: Patient admitted on 05/28/2018 QUNICY HIGINBOTHAM is a 84 y.o. male patient with a known history of hypertension, GERD, CHF, history of alcoholism. He is status post exploratory laparoscopy with duodenoplastywith J-tube placement and JP drain on 05/08/2018. Patient had originally been admitted for large GI bleed with perforated duodenal ulcer. He was discharged on 05/26/18. During the hospitalization, patient was treated for pneumonia and completed course of antibiotics. He returned to the emergency room complaining of increased abdominal pain with the ball of having become disconnected from the JP drain which patient reported occurred 48 hours prior to his arrival. Patient denies experiencing shortness of breath, chest pain, nausea, vomiting, diarrhea. He has noted no fevers or chills. Patient denies diarrhea. He reports his last bowel movement was 3 days ago. He denies hematemesis, hematochezia, or melena. Patient reports his last alcoholic intake was about 1 month ago with 1 pint vodka at that time. Rapid COVID-19 testing was completed in the emergency room and is negative. General surgery was consulted, Dr.Jose Piscoyawho reviewed the CT abdomen noting some bubbles of air around the liver dome however no large pneumoperitoneum seen. There are postop changes with inflammation around the repair site however no colonic wall thickening. Although there is evidence of free air on CT scan, this is likely explained by the fact that the patient's JP drain has not been connected to bulb  suction allowing air to get into the abdomen through the drain. Recommendations to keep the patient n.p.o. precautionary and follow-up results of C. difficile testing. Patient has been started on IV Protonix. Troponin is 0.08 on arrival. We will continue serial troponin. No leukocytosis identified. Hemoglobin is 10.0 with hematocrit 31.1.  Psychiatry consult is requested for capacity evaluation prior to patient's discharge. Patient is essentially homeless, but stating can manage his affairs on his own.  Past Psychiatric History: Alcohol and drug abuse; depression; patient has never been seen by outpatient psychiatrist or therapist.  Risk to Self:  yes, making threats to kill self  Risk to Others:  no Prior Inpatient Therapy:  none Prior Outpatient Therapy:  Dr. Denyce Robert, PCP has been encouraging patient to seek psychiatric care due to chronic suicidal thoughts.  On initial psychiatric evaluation 06/03/2018, patient endorses that he is depressed and wanting to end his life.  He reports he has had a poor appetite and poor sleep for some time now.  He feels isolated, and has little interest in engaging with others.  Describes low energy, low concentration.  He notes that is been difficult for him to do things for himself.  He does endorse that he drinks socially, and admits to smoking 1 pack/day.  He states he had a pack of cigarettes on his windowsill here in the hospital that he was using as a deterrent to quit smoking, but he believes that somebody took these from his room.  He states that now that he cannot see his cigarettes he is craving cigarettes.  He does not wish to have a nicotine patch or nicotine gum as he reports these are not helpful for him.  Patient is unable to state the correct date, reports year as 2000, and then corrects himself to state 2001.  When told that his 2020, he relates, "all my [name] have lost a lot of time."  He does state the month correctly.  Patient endorses that  he has spoken with a friend who he said will meet him at the hospital at 6 PM to him up.  When asked friend's name or phone number, he is unable to give this information, but states that his son knows this person and will drop him off at his house.  On test of recall, patient does have immediate recall as well as significant delayed recall 10 minutes after full conversation.  Patient states that he does not drive.  He relates that he cannot find people to buy groceries for him when he needs to.  He states he is independent in his self-care.  Patient describes he has been feeling suicidal for some time, he thinks about driving off of bridges, jumping from heights.  He denies that he has access to weapons.  Patient denies homicidal ideation.  He denies auditory and visual hallucinations, although possibly has been having hallucinations while in the hospital.  Collateral is obtained from patient's son Jamorian Dimaria) 06/03/2018: Son describes that patient has called him since he has been in the hospital and reports that he is home, and the store, and is disoriented to date.  He relates that his father has been reporting that he has been talking to relatives.  Son reports that he does not know this friend that his father was speaking about. When he confronted father about where he intended to stay,  He told son that he would stay in a motel.  Son reports that father has not been able to care for himself.  He spends his money on alcohol, and has other people by his alcohol for him.  Son states that patient cannot live at his home due to his children living in the home.  He does express concern for fathers depression and safety.  06/04/2018: On reevaluation, patient is awake and alert.  Expresses frustration that he does not have a place to discharge.  Patient states he wants to be able to walk around and leave his room.  He feels very confined while in the hospital.  Today he appears oriented to person, place, and  time.  He does not appear to be to responding to internal stimuli on evaluation.  Patient reports that he sometimes has depression and thoughts of being better off dead.  But he specifically denies any suicidal ideation, plan, or intent.  Patient denies access to weapons.  He denies HI or AVH. Patient states that he took all medications as prescribed, to include mirtazapine last night.  He states that he slept well and has a good appetite.  He denies other side effects to medication.  He is agreeable to continuing mirtazapine for underlying depressive disorder.  Risks, side effects, benefits, adverse effects have been explained to patient.   Past Medical History:  Past Medical History:  Diagnosis Date  . C. difficile diarrhea 11/2017  . Elevated serum creatinine   . History of melanoma   . Hypertension   . Osteoarthritis   . Tobacco abuse     Past Surgical History:  Procedure Laterality Date  . ANKLE FRACTURE SURGERY    . CENTRAL VENOUS CATHETER INSERTION  05/08/2018   Procedure: INSERTION CENTRAL LINE ADULT;  Surgeon: Olean Ree, MD;  Location: [facility];  Service: General;;  . ESOPHAGOGASTRODUODENOSCOPY  05/08/2018   Procedure: ESOPHAGOGASTRODUODENOSCOPY (EGD);  Surgeon: Olean Ree, MD;  Location: [facility];  Service: General;;  . ESOPHAGOGASTRODUODENOSCOPY (EGD) WITH PROPOFOL N/A 05/03/2018   Procedure: ESOPHAGOGASTRODUODENOSCOPY (EGD) WITH PROPOFOL;  Surgeon: Toledo, Benay Pike, MD;  Location: [facility];  Service: Gastroenterology;  Laterality: N/A;  . EXTERNAL EAR SURGERY    . INTRAMEDULLARY (IM) NAIL INTERTROCHANTERIC Left 12/01/2017   Procedure: INTRAMEDULLARY (IM) NAIL INTERTROCHANTRIC;  Surgeon: Thornton Park, MD;  Location: [facility];  Service: Orthopedics;  Laterality: Left;  . JEJUNOSTOMY  05/08/2018   Procedure: JEJUNOSTOMY;  Surgeon: Olean Ree, MD;  Location: [facility];  Service: General;;  . LAPAROTOMY N/A 05/08/2018   Procedure: EXPLORATORY LAPAROTOMY;  Surgeon:  Olean Ree, MD;  Location: [facility];  Service: General;  Laterality: N/A;  . ROTATOR CUFF REPAIR    . TONSILLECTOMY    . TYMPANOPLASTY W/ MASTOIDECTOMY     Patient with reports of mastoid surgery (appears to have had surgery on TM; Left).  . VISCERAL ARTERY INTERVENTION N/A 05/04/2018   Procedure: VISCERAL ARTERY INTERVENTION;  Surgeon: Algernon Huxley, MD;  Location: [place] [facility];  Service: Cardiovascular;  Laterality: N/A;   Family History:  Family History  Problem Relation Age of Onset  . Prostate cancer Neg Hx   . Bladder Cancer Neg Hx   . Kidney cancer Neg Hx    Family Psychiatric  History: None stated  Social History:  Social History   Substance and Sexual Activity  Alcohol Use No     Social History   Substance and Sexual Activity  Drug Use No    Social History   Socioeconomic History  . Marital status: Divorced    Spouse name: Not on file  . Number of children: Not on file  . Years of education: Not on file  . Highest education level: Not on file  Occupational History  . Not on file  Social Needs  . Financial resource strain: Not hard at all  . Food insecurity:    Worry: Never true    Inability: Never true  . Transportation needs:    Medical: No    Non-medical: No  Tobacco Use  . Smoking status: Current Every Day Smoker    Packs/day: 1.00    Years: 70.00    Pack years: 70.00    Types: Cigarettes  . Smokeless tobacco: Never Used  Substance and Sexual Activity  . Alcohol use: No  . Drug use: No  . Sexual activity: Never  Lifestyle  . Physical activity:    Days per week: 1 day    Minutes per session: 90 min  . Stress: Not at all  Relationships  . Social connections:    Talks on phone: Not on file    Gets together: Not on file    Attends religious service: Not on file    Active member of club or organization: Not on file    Attends meetings of clubs or organizations: Not on file    Relationship status: Not on file  Other Topics Concern   . Not on file  Social History Narrative  . Not on file   Additional Social History:         Lived alone in a boarding house for a couple of months, prior to presenting with medical complications.  Does not drive He does own self care, however was not caring for self.  He had been discharged to a  group home but he had been dismissed to needing "a higher level of care".  Patient drinks 1/5 vodka every 1-2 days (people from boarding house would buy for him). Patient abusing tramadol.                  Sleep: Fair  Appetite:  Fair  Current Medications: Current Facility-Administered Medications  Medication Dose Route Frequency Provider Last Rate Last Dose  . acetaminophen (TYLENOL) tablet 650 mg  650 mg Oral Q6H PRN Mayer Camel, NP   650 mg at 06/02/18 2216   Or  . acetaminophen (TYLENOL) suppository 650 mg  650 mg Rectal Q6H PRN Seals, Theo Dills, NP      . ALPRAZolam Duanne Moron) tablet 0.25 mg  0.25 mg Oral BID PRN Loletha Grayer, MD   0.25 mg at 06/04/18 0726  . calcium-vitamin D (OSCAL WITH D) 500-200 MG-UNIT per tablet 1 tablet  1 tablet Oral BID Mayer Camel, NP   1 tablet at 06/04/18 0726  . folic acid (FOLVITE) tablet 1 mg  1 mg Oral Daily Loletha Grayer, MD   1 mg at 06/04/18 0735  . ipratropium-albuterol (DUONEB) 0.5-2.5 (3) MG/3ML nebulizer solution 3 mL  3 mL Nebulization Q6H PRN Seals, Theo Dills, NP      . mirtazapine (REMERON) tablet 15 mg  15 mg Oral QHS Lavella Hammock, MD   15 mg at 06/03/18 2000  . multivitamin with minerals tablet 1 tablet  1 tablet Oral Daily Seals, Theo Dills, NP   1 tablet at 06/04/18 0726  . ondansetron (ZOFRAN) tablet 4 mg  4 mg Oral Q6H PRN Seals, Theo Dills, NP   4 mg at 06/01/18 2016   Or  . ondansetron (ZOFRAN) injection 4 mg  4 mg Intravenous Q6H PRN Seals, Theo Dills, NP      . pantoprazole (PROTONIX) EC tablet 40 mg  40 mg Oral BID Loletha Grayer, MD   40 mg at 06/04/18 0726  . polyethylene glycol (MIRALAX / GLYCOLAX) packet  17 g  17 g Oral Daily PRN Seals, Levada Dy H, NP      . protein supplement (ENSURE MAX) liquid  11 oz Oral BID BM Loletha Grayer, MD   11 oz at 06/03/18 1330  . sodium chloride flush (NS) 0.9 % injection 3 mL  3 mL Intravenous Q12H Seals, Angela H, NP   3 mL at 06/04/18 0733  . tamsulosin (FLOMAX) capsule 0.4 mg  0.4 mg Oral Daily Seals, Angela H, NP   0.4 mg at 06/04/18 0726  . thiamine (VITAMIN B-1) tablet 100 mg  100 mg Oral Daily Seals, Angela H, NP   100 mg at 06/04/18 5573   Or  . thiamine (B-1) injection 100 mg  100 mg Intravenous Daily Seals, Levada Dy H, NP      . traMADol (ULTRAM) tablet 50 mg  50 mg Oral Q6H PRN Hillary Bow, MD   50 mg at 06/04/18 0319  . traZODone (DESYREL) tablet 50 mg  50 mg Oral QHS PRN Mayer Camel, NP   50 mg at 06/03/18 1957  . vancomycin (VANCOCIN) 50 mg/mL oral solution 125 mg  125 mg Oral QID Hillary Bow, MD   125 mg at 06/04/18 2202    Lab Results:  Results for orders placed or performed during the hospital encounter of 05/28/18 (from the past 48 hour(s))  Basic metabolic panel     Status: Abnormal   Collection Time: 06/04/18  4:34 AM  Result Value Ref  Range   Sodium 140 135 - 145 mmol/L   Potassium 4.2 3.5 - 5.1 mmol/L   Chloride 109 98 - 111 mmol/L   CO2 25 22 - 32 mmol/L   Glucose, Bld 101 (H) 70 - 99 mg/dL   BUN 26 (H) 8 - 23 mg/dL   Creatinine, Ser 1.81 (H) 0.61 - 1.24 mg/dL   Calcium 8.4 (L) 8.9 - 10.3 mg/dL   GFR calc non Af Amer 34 (L) >60 mL/min   GFR calc Af Amer 39 (L) >60 mL/min   Anion gap 6 5 - 15    Comment: Performed at [facility], [address] [place], [place] [address]    Blood Alcohol level:  Lab Results  Component Value Date   ETH 90 (H) 03/13/2018   ETH <10 89/38/1017    Metabolic Disorder Labs: Lab Results  Component Value Date   HGBA1C 5.7 07/23/2016   No results found for: PROLACTIN Lab Results  Component Value Date   CHOL 146 07/01/2016   TRIG 95 05/11/2018   HDL 51.60 07/01/2016    CHOLHDL 3 07/01/2016   VLDL 13.0 07/01/2016   LDLCALC 82 07/01/2016    Physical Findings: AIMS:  , ,  ,  ,    CIWA:  CIWA-Ar Total: 0 COWS:     Musculoskeletal: Strength & Muscle Tone: within normal limits Gait & Station: normal Patient leans: N/A  Psychiatric Specialty Exam: Physical Exam  Nursing note and vitals reviewed. Constitutional: He is oriented to person, place, and time. He appears well-developed and well-nourished. No distress.  HENT:  Head: Normocephalic and atraumatic.  Eyes: EOM are normal.  Neck: Normal range of motion.  Cardiovascular: Normal rate and regular rhythm.  Respiratory: Effort normal. No respiratory distress.  Musculoskeletal: Normal range of motion.  Neurological: He is alert and oriented to person, place, and time.    Review of Systems  Psychiatric/Behavioral: Positive for depression and substance abuse (alcohol). Negative for hallucinations and suicidal ideas. The patient is not nervous/anxious and does not have insomnia.   All other systems reviewed and are negative.   Blood pressure 125/67, pulse 95, temperature 98.9 F (37.2 C), temperature source Oral, resp. rate 19, height 5\' 8"  (1.727 m), weight 54.9 kg, SpO2 95 %.Body mass index is 18.4 kg/m.  General Appearance: Casual  Eye Contact:  Fair  Speech:  Clear and Coherent and Normal Rate  Volume:  Normal  Mood:  Dysphoric  Affect:  Congruent  Thought Process:  Goal Directed  Orientation:  Full (Time, Place, and Person)  Thought Content:  Hallucinations: None and Rumination  Suicidal Thoughts:  No  Homicidal Thoughts:  No  Memory:  good  Judgement:  Fair  Insight:  Shallow  Psychomotor Activity:  Restlessness  Concentration:  Concentration: Fair  Recall:  [place] of Knowledge:  Fair  Language:  Good  Akathisia:  No  Handed:  Right  AIMS (if indicated):     Assets:  Warehouse manager Resources/Insurance  ADL's:  Intact  Cognition:  WNL  Sleep:         Treatment Plan Summary: TREVAUGHN SCHEAR is a 84 y.o. male for capacity evaluation. Patient mental status is improved today.  Psychiatry will continue to follow as needed to determine capacity, which can fluctuate on a day-to-day basis, for medical decision making and coordinating discharge care.  Daily contact with patient to assess and evaluate symptoms and progress in treatment and Medication management  Continue Remeron 15 mg  at bedtime for depression  Disposition: Supportive therapy provided about ongoing stressors.  Psychiatry will continue to re-evaluate and assist in coordination for discharge planning. At this time, patient does not meet criteria for inpatient psychiatric or involuntary commitment.  Lavella Hammock, MD 06/04/2018, 9:57 AM

## 2018-06-04 NOTE — TOC Progression Note (Signed)
Transition of Care [facility]) - Progression Note    Patient Details  Name: Justin Andrews MRN: [id number] Date of Birth: [birth year]  Transition of Care [facility]) CM/SW [place], [place] Phone Number: 06/04/2018, 3:01 PM  Clinical Narrative:   Patient continues to be medically ready for discharge. CSW has not heard back from [facility]. Per patient's son, [place], patient will not be accepted back to the group home and he can not live with family. Son now states that he no longer wants to be involved in patient's care. Patient has a brother who also does not want to be involved in patient care. Patient has been deemed to lack capacity per Psychiatry. CSW contacted Russella Dar with DSS regarding patient. Rob Hickman states that they are still following patient. CSW explained that psych has declared he lacks capacity and family is unwilling to participate any longer. Rob Hickman states that they will begin looking for alternate placement in a family care or group home. She also states that they can begin guardianship for patient. CSW will continue to follow for discharge planning.     Expected Discharge Plan: Group Home([facility]) Barriers to Discharge: Continued Medical Work up  Expected Discharge Plan and Services Expected Discharge Plan: Group Home([facility])   Discharge Planning Services: CM Consult   Living arrangements for the past 2 months: Group Home                                       Social Determinants of Health (SDOH) Interventions    Readmission Risk Interventions Readmission Risk Prevention Plan 05/07/2018  Transportation Screening Complete  PCP or Specialist Appt within 3-5 Days Complete  HRI or Home Care Consult Complete  Medication Review (RN Care Manager) Complete  Some recent data might be hidden

## 2018-06-04 NOTE — Telephone Encounter (Signed)
Please follow-up with the patient's son to see if he has any additional questions or concerns that he would like to discuss with me.

## 2018-06-04 NOTE — Progress Notes (Signed)
Patient ID: Justin Andrews, male   DOB: August 30, 1934, 84 y.o.   MRN: [id number]  [facility] PROGRESS NOTE  Justin Andrews:[id number] DOB: 02/24/1934 DOA: 05/28/2018 PCP: Leone Haven, MD  HPI/Subjective: Patient very upset that he needs to be here in the hospital.  He was deemed incompetent by the psychiatrist yesterday.  Since I do not have a good plan upon discharge he will need to stay in the hospital until psych deems him competent or we find placement for him.  The patient was not happy with this conversation.  Physically feels okay.  Objective: Vitals:   06/03/18 2021 06/04/18 0323  BP: (!) 113/51 125/67  Pulse: 89 95  Resp: 20 19  Temp: 98.3 F (36.8 C) 98.9 F (37.2 C)  SpO2: 97% 95%   No intake or output data in the 24 hours ending 06/04/18 1249 Filed Weights   05/28/18 1638 05/29/18 0615  Weight: 49.9 kg 54.9 kg    ROS: Review of Systems  Constitutional: Negative for chills and fever.  Eyes: Negative for blurred vision.  Respiratory: Negative for cough and shortness of breath.   Cardiovascular: Negative for chest pain.  Gastrointestinal: Positive for abdominal pain. Negative for constipation, diarrhea, nausea and vomiting.  Genitourinary: Negative for dysuria.  Musculoskeletal: Negative for joint pain.  Neurological: Negative for dizziness and headaches.   Exam: Physical Exam  Constitutional: He is oriented to person, place, and time.  HENT:  Nose: No mucosal edema.  Mouth/Throat: No oropharyngeal exudate or posterior oropharyngeal edema.  Eyes: Pupils are equal, round, and reactive to light. Conjunctivae, EOM and lids are normal.  Neck: No JVD present. Carotid bruit is not present. No edema present. No thyroid mass and no thyromegaly present.  Cardiovascular: S1 normal and S2 normal. Exam reveals no gallop.  No murmur heard. Pulses:      Dorsalis pedis pulses are 2+ on the right side and 2+ on the left side.  Respiratory: No respiratory distress. He  has no wheezes. He has no rhonchi. He has no rales.  GI: Soft. Bowel sounds are normal. There is abdominal tenderness.  Small spot on the right abdomen where prior port site was for the surgery has a little inflammation there.  Musculoskeletal:     Right ankle: He exhibits no swelling.     Left ankle: He exhibits no swelling.  Lymphadenopathy:    He has no cervical adenopathy.  Neurological: He is alert and oriented to person, place, and time. No cranial nerve deficit.  Skin: Skin is warm. No rash noted. Nails show no clubbing.  Psychiatric: He has a normal mood and affect.      Data Reviewed: Basic Metabolic Panel: Recent Labs  Lab 05/28/18 1654 05/29/18 0434 05/30/18 0649 06/04/18 0434  NA 137 139 140 140  K 4.4 3.5 3.8 4.2  CL 106 110 113* 109  CO2 23 22 22 25   GLUCOSE 154* 109* 106* 101*  BUN 28* 27* 21 26*  CREATININE 2.32* 2.12* 1.85* 1.81*  CALCIUM 8.3* 8.0* 7.9* 8.4*   Liver Function Tests: Recent Labs  Lab 05/28/18 1654  AST 15  ALT 10  ALKPHOS 93  BILITOT 0.1*  PROT 6.3*  ALBUMIN 2.4*   Recent Labs  Lab 05/28/18 1654  LIPASE 42   CBC: Recent Labs  Lab 05/28/18 1654 05/29/18 0434  WBC 10.2 8.0  HGB 10.0* 8.8*  HCT 31.1* 27.5*  MCV 91.2 90.5  PLT 431* 373   Cardiac Enzymes: Recent Labs  Lab 05/29/18 0454  TROPONINI <0.03   BNP (last 3 results) Recent Labs    11/19/17 0937  BNP 457.0*      Recent Results (from the past 240 hour(s))  SARS Coronavirus 2 (CEPHEID - Performed in [facility] hospital lab), Hosp Order     Status: None   Collection Time: 05/28/18  7:23 PM  Result Value Ref Range Status   SARS Coronavirus 2 NEGATIVE NEGATIVE Final    Comment: (NOTE) If result is NEGATIVE SARS-CoV-2 target nucleic acids are NOT DETECTED. The SARS-CoV-2 RNA is generally detectable in upper and lower  respiratory specimens during the acute phase of infection. The lowest  concentration of SARS-CoV-2 viral copies this assay can detect is  250  copies / mL. A negative result does not preclude SARS-CoV-2 infection  and should not be used as the sole basis for treatment or other  patient management decisions.  A negative result may occur with  improper specimen collection / handling, submission of specimen other  than nasopharyngeal swab, presence of viral mutation(s) within the  areas targeted by this assay, and inadequate number of viral copies  (<250 copies / mL). A negative result must be combined with clinical  observations, patient history, and epidemiological information. If result is POSITIVE SARS-CoV-2 target nucleic acids are DETECTED. The SARS-CoV-2 RNA is generally detectable in upper and lower  respiratory specimens dur ing the acute phase of infection.  Positive  results are indicative of active infection with SARS-CoV-2.  Clinical  correlation with patient history and other diagnostic information is  necessary to determine patient infection status.  Positive results do  not rule out bacterial infection or co-infection with other viruses. If result is PRESUMPTIVE POSTIVE SARS-CoV-2 nucleic acids MAY BE PRESENT.   A presumptive positive result was obtained on the submitted specimen  and confirmed on repeat testing.  While 2019 novel coronavirus  (SARS-CoV-2) nucleic acids may be present in the submitted sample  additional confirmatory testing may be necessary for epidemiological  and / or clinical management purposes  to differentiate between  SARS-CoV-2 and other Sarbecovirus currently known to infect humans.  If clinically indicated additional testing with an alternate test  methodology [phone]) is advised. The SARS-CoV-2 RNA is generally  detectable in upper and lower respiratory sp ecimens during the acute  phase of infection. The expected result is Negative. Fact Sheet for Patients:  StrictlyIdeas.no Fact Sheet for Healthcare  Providers: BankingDealers.co.za This test is not yet approved or cleared by the [place] FDA and has been authorized for detection and/or diagnosis of SARS-CoV-2 by FDA under an Emergency Use Authorization (EUA).  This EUA will remain in effect (meaning this test can be used) for the duration of the COVID-19 declaration under Section 564(b)(1) of the Act, 21 U.S.C. section 360bbb-3(b)(1), unless the authorization is terminated or revoked sooner. Performed at [facility], [place], [place] [address]   C Difficile Quick Screen w PCR reflex     Status: Abnormal   Collection Time: 05/29/18  1:23 PM  Result Value Ref Range Status   C Diff antigen POSITIVE (A) NEGATIVE Final   C Diff toxin POSITIVE (A) NEGATIVE Final   C Diff interpretation Toxin producing C. difficile detected.  Final    CommentLarwance Rote AT 1433 05/29/2018 KMP Performed at [facility], [address] [place], [place] [address]       Scheduled Meds: . calcium-vitamin D  1 tablet Oral BID  . folic  acid  1 mg Oral Daily  . mirtazapine  15 mg Oral QHS  . multivitamin with minerals  1 tablet Oral Daily  . pantoprazole  40 mg Oral BID  . Ensure Max Protein  11 oz Oral BID BM  . sodium chloride flush  3 mL Intravenous Q12H  . tamsulosin  0.4 mg Oral Daily  . thiamine  100 mg Oral Daily   Or  . thiamine  100 mg Intravenous Daily  . vancomycin  125 mg Oral QID   Continuous Infusions:  Assessment/Plan:  1. C. difficile colitis.  Patient with abdominal pain and diarrhea.  Diarrhea has improved.  On p.o. vancomycin. 2. Chronic kidney disease stage III.  Monitor while here in the hospital. 3. Recent surgery for bleeding duodenal ulcer.  JP drain removed.  Feeding tube not being used.  Seen by surgery.  Continue PPI. 4. History of alcohol abuse on thiamine 5. History of hypertension.  Blood pressure on the lower side I stopped all  antihypertensives. 6. Patient lacks capacity to make good medical decisions.  I do not have a good disposition at this point.  Son will not take him to his house at this time.   Code Status:     Code Status Orders  (From admission, onward)         Start     Ordered   05/29/18 0016  Do not attempt resuscitation (DNR)  Continuous    Question Answer Comment  In the event of cardiac or respiratory ARREST Do not call a "code blue"   In the event of cardiac or respiratory ARREST Do not perform Intubation, CPR, defibrillation or ACLS   In the event of cardiac or respiratory ARREST Use medication by any route, position, wound care, and other measures to relive pain and suffering. May use oxygen, suction and manual treatment of airway obstruction as needed for comfort.   Comments per patient's only child the patient is to be DNR per his father's prior expressed wishes. Still ok to receive blood, antibiotics, and other care but NO intubation and NO CPR      05/29/18 0015        Code Status History    Date Active Date Inactive Code Status Order ID Comments User Context   05/29/2018 0016 05/29/2018 0016 DNR [id number]  Mayer Camel, NP ED   05/02/2018 1427 05/26/2018 2140 DNR [id number]  Vaughan Basta, MD Inpatient   05/02/2018 0804 05/02/2018 1427 DNR [id number]  Delman Kitten, MD ED   03/13/2018 1929 03/14/2018 2030 Full Code [id number]  Nena Polio, MD ED   11/30/2017 1846 12/01/2017 2040 Full Code [id number]  Thornton Park, MD Inpatient   11/30/2017 1601 11/30/2017 1846 Full Code [id number]  Demetrios Loll, MD Inpatient     Family Communication: Spoke with son on the phone yesterday Disposition Plan: As per psychiatry, patient lacks the capacity to make decisions for himself at this point.  I gave this message to the care manager team and social work team.  They will look into other options.  Time spent: 28 minutes  [place]

## 2018-06-05 ENCOUNTER — Encounter: Payer: Medicare HMO | Admitting: Surgery

## 2018-06-05 NOTE — Progress Notes (Signed)
Patient ID: Justin Andrews, male   DOB: 1934-02-14, 84 y.o.   MRN: [id number]  [facility] PROGRESS NOTE  Justin Andrews YQM:[id number] DOB: [birth year] DOA: 05/28/2018 PCP: Leone Haven, MD  HPI/Subjective: Patient upset that he has to stay in the hospital.  Wondering why he has to be here when he can go live in a hotel.  Objective: Vitals:   06/05/18 0517 06/05/18 0912  BP: 117/65 (!) 122/58  Pulse: 89 94  Resp: 18   Temp: 97.6 F (36.4 C)   SpO2: 96%    No intake or output data in the 24 hours ending 06/05/18 1412 Filed Weights   05/28/18 1638 05/29/18 0615  Weight: 49.9 kg 54.9 kg    ROS: Review of Systems  Constitutional: Negative for chills and fever.  Eyes: Negative for blurred vision.  Respiratory: Negative for cough and shortness of breath.   Cardiovascular: Negative for chest pain.  Gastrointestinal: Positive for abdominal pain. Negative for constipation, diarrhea, nausea and vomiting.  Genitourinary: Negative for dysuria.  Musculoskeletal: Negative for joint pain.  Neurological: Negative for dizziness and headaches.   Exam: Physical Exam  Constitutional: He is oriented to person, place, and time.  HENT:  Nose: No mucosal edema.  Mouth/Throat: No oropharyngeal exudate or posterior oropharyngeal edema.  Eyes: Pupils are equal, round, and reactive to light. Conjunctivae, EOM and lids are normal.  Neck: No JVD present. Carotid bruit is not present. No edema present. No thyroid mass and no thyromegaly present.  Cardiovascular: S1 normal and S2 normal. Exam reveals no gallop.  No murmur heard. Pulses:      Dorsalis pedis pulses are 2+ on the right side and 2+ on the left side.  Respiratory: No respiratory distress. He has no wheezes. He has no rhonchi. He has no rales.  GI: Soft. Bowel sounds are normal. There is abdominal tenderness.  Small spot on the right abdomen where prior port site was for the surgery has a little inflammation there.  Musculoskeletal:      Right ankle: He exhibits no swelling.     Left ankle: He exhibits no swelling.  Lymphadenopathy:    He has no cervical adenopathy.  Neurological: He is alert and oriented to person, place, and time. No cranial nerve deficit.  Skin: Skin is warm. No rash noted. Nails show no clubbing.  Psychiatric: He has a normal mood and affect.      Data Reviewed: Basic Metabolic Panel: Recent Labs  Lab 05/30/18 0649 06/04/18 0434  NA 140 140  K 3.8 4.2  CL 113* 109  CO2 22 25  GLUCOSE 106* 101*  BUN 21 26*  CREATININE 1.85* 1.81*  CALCIUM 7.9* 8.4*   BNP (last 3 results) Recent Labs    11/19/17 0937  BNP 457.0*      Recent Results (from the past 240 hour(s))  SARS Coronavirus 2 (CEPHEID - Performed in [facility] hospital lab), Hosp Order     Status: None   Collection Time: 05/28/18  7:23 PM  Result Value Ref Range Status   SARS Coronavirus 2 NEGATIVE NEGATIVE Final    Comment: (NOTE) If result is NEGATIVE SARS-CoV-2 target nucleic acids are NOT DETECTED. The SARS-CoV-2 RNA is generally detectable in upper and lower  respiratory specimens during the acute phase of infection. The lowest  concentration of SARS-CoV-2 viral copies this assay can detect is 250  copies / mL. A negative result does not preclude SARS-CoV-2 infection  and should not be used as  the sole basis for treatment or other  patient management decisions.  A negative result may occur with  improper specimen collection / handling, submission of specimen other  than nasopharyngeal swab, presence of viral mutation(s) within the  areas targeted by this assay, and inadequate number of viral copies  (<250 copies / mL). A negative result must be combined with clinical  observations, patient history, and epidemiological information. If result is POSITIVE SARS-CoV-2 target nucleic acids are DETECTED. The SARS-CoV-2 RNA is generally detectable in upper and lower  respiratory specimens dur ing the acute phase of  infection.  Positive  results are indicative of active infection with SARS-CoV-2.  Clinical  correlation with patient history and other diagnostic information is  necessary to determine patient infection status.  Positive results do  not rule out bacterial infection or co-infection with other viruses. If result is PRESUMPTIVE POSTIVE SARS-CoV-2 nucleic acids MAY BE PRESENT.   A presumptive positive result was obtained on the submitted specimen  and confirmed on repeat testing.  While 2019 novel coronavirus  (SARS-CoV-2) nucleic acids may be present in the submitted sample  additional confirmatory testing may be necessary for epidemiological  and / or clinical management purposes  to differentiate between  SARS-CoV-2 and other Sarbecovirus currently known to infect humans.  If clinically indicated additional testing with an alternate test  methodology [phone]) is advised. The SARS-CoV-2 RNA is generally  detectable in upper and lower respiratory sp ecimens during the acute  phase of infection. The expected result is Negative. Fact Sheet for Patients:  StrictlyIdeas.no Fact Sheet for Healthcare Providers: BankingDealers.co.za This test is not yet approved or cleared by the [place] FDA and has been authorized for detection and/or diagnosis of SARS-CoV-2 by FDA under an Emergency Use Authorization (EUA).  This EUA will remain in effect (meaning this test can be used) for the duration of the COVID-19 declaration under Section 564(b)(1) of the Act, 21 U.S.C. section 360bbb-3(b)(1), unless the authorization is terminated or revoked sooner. Performed at [facility], [place], [place] [address]   C Difficile Quick Screen w PCR reflex     Status: Abnormal   Collection Time: 05/29/18  1:23 PM  Result Value Ref Range Status   C Diff antigen POSITIVE (A) NEGATIVE Final   C Diff toxin POSITIVE (A) NEGATIVE Final   C  Diff interpretation Toxin producing C. difficile detected.  Final    CommentLarwance Rote AT 1433 05/29/2018 KMP Performed at [facility], [address], [place],  [address]       Scheduled Meds: . calcium-vitamin D  1 tablet Oral BID  . folic acid  1 mg Oral Daily  . mirtazapine  15 mg Oral QHS  . multivitamin with minerals  1 tablet Oral Daily  . pantoprazole  40 mg Oral BID  . Ensure Max Protein  11 oz Oral BID BM  . sodium chloride flush  3 mL Intravenous Q12H  . tamsulosin  0.4 mg Oral Daily  . thiamine  100 mg Oral Daily   Or  . thiamine  100 mg Intravenous Daily  . vancomycin  125 mg Oral QID    Assessment/Plan:  1. C. difficile colitis.  Patient with abdominal pain and diarrhea.  Diarrhea has improved.  On p.o. vancomycin for a few more days 2. Chronic kidney disease stage III.  Monitor while here in the hospital. 3. Recent surgery for bleeding duodenal ulcer.  JP drain removed.  Feeding tube  not being used.  Seen by surgery.  Continue PPI. 4. History of alcohol abuse on thiamine 5. History of hypertension.  Blood pressure on the lower side I stopped all antihypertensives. 6. Patient lacks capacity to make good medical decisions as per psychiatry.  I do not have a good disposition at this point.  Son will not take him to his house at this time.  Guardianship and DSS referrals as per care manager team.   Code Status:     Code Status Orders  (From admission, onward)         Start     Ordered   05/29/18 0016  Do not attempt resuscitation (DNR)  Continuous    Question Answer Comment  In the event of cardiac or respiratory ARREST Do not call a "code blue"   In the event of cardiac or respiratory ARREST Do not perform Intubation, CPR, defibrillation or ACLS   In the event of cardiac or respiratory ARREST Use medication by any route, position, wound care, and other measures to relive pain and suffering. May use oxygen, suction and manual  treatment of airway obstruction as needed for comfort.   Comments per patient's only child the patient is to be DNR per his father's prior expressed wishes. Still ok to receive blood, antibiotics, and other care but NO intubation and NO CPR      05/29/18 0015        Code Status History    Date Active Date Inactive Code Status Order ID Comments User Context   05/29/2018 0016 05/29/2018 0016 DNR [id number]  Mayer Camel, NP ED   05/02/2018 1427 05/26/2018 2140 DNR [id number]  Vaughan Basta, MD Inpatient   05/02/2018 0804 05/02/2018 1427 DNR [id number]  Delman Kitten, MD ED   03/13/2018 1929 03/14/2018 2030 Full Code [id number]  Nena Polio, MD ED   11/30/2017 1846 12/01/2017 2040 Full Code [id number]  Thornton Park, MD Inpatient   11/30/2017 1601 11/30/2017 1846 Full Code [id number]  Demetrios Loll, MD Inpatient     Family Communication: Spoke with the son a few days ago and he does not want to take the patient to his home. Disposition Plan: As per psychiatry, patient lacks the capacity to make decisions for himself at this point.  Patient will be here in the hospital a long time.  Guardianship will need to be obtained.  Time spent: 27 minutes  [place]

## 2018-06-05 NOTE — TOC Progression Note (Signed)
Transition of Care [facility]) - Progression Note    Patient Details  Name: Justin Andrews MRN: [id number] Date of Birth: 07-23-1934  Transition of Care [facility] CM/SW [place], RN Phone Number: 06/05/2018, 10:51 AM  Clinical Narrative:     Pt continues to wait for DSS to obtain guardianship, Son is not wanting to be responsible and neither is the patients brother, continue to monitor,   Expected Discharge Plan: Group Home([facility]) Barriers to Discharge: Continued Medical Work up  Expected Discharge Plan and Services Expected Discharge Plan: Group Home([facility] [place] [facility])   Discharge Planning Services: CM Consult   Living arrangements for the past 2 months: Group Home                                       Social Determinants of Health (SDOH) Interventions    Readmission Risk Interventions Readmission Risk Prevention Plan 05/07/2018  Transportation Screening Complete  PCP or Specialist Appt within 3-5 Days Complete  HRI or Home Care Consult Complete  Medication Review (RN Care Manager) Complete  Some recent data might be hidden

## 2018-06-05 NOTE — Care Management Important Message (Signed)
Important Message  Patient Details  Name: Justin Andrews MRN: [id number] Date of Birth: 12-Nov-1934   Medicare Important Message Given:  Yes    Su Hilt, RN 06/05/2018, 10:48 AM

## 2018-06-06 NOTE — Progress Notes (Signed)
Patient ID: Justin Andrews, male   DOB: Dec 15, 1934, 84 y.o.   MRN: [id number]  [facility] PROGRESS NOTE  Justin Andrews:[id number] DOB: 11-May-1934 DOA: 05/28/2018 PCP: Leone Haven, MD  HPI/Subjective: Patient is pleasant today, no overnight incidents, he was deemed incompetent by the psychiatrist 06/04/2018.   Objective: Vitals:   06/05/18 1613 06/06/18 0431  BP: 139/61 121/68  Pulse: 97 93  Resp: 17 18  Temp:  98 F (36.7 C)  SpO2: 98% 97%   No intake or output data in the 24 hours ending 06/06/18 1158 Filed Weights   05/28/18 1638 05/29/18 0615  Weight: 49.9 kg 54.9 kg    ROS: Review of Systems  Constitutional: Negative for chills and fever.  Eyes: Negative for blurred vision.  Respiratory: Negative for cough and shortness of breath.   Cardiovascular: Negative for chest pain.  Gastrointestinal: Positive for abdominal pain. Negative for constipation, diarrhea, nausea and vomiting.  Genitourinary: Negative for dysuria.  Musculoskeletal: Negative for joint pain.  Neurological: Negative for dizziness and headaches.   Exam: Physical Exam  Constitutional: He is oriented to person, place, and time.  HENT:  Nose: No mucosal edema.  Mouth/Throat: No oropharyngeal exudate or posterior oropharyngeal edema.  Eyes: Pupils are equal, round, and reactive to light. Conjunctivae, EOM and lids are normal.  Neck: No JVD present. Carotid bruit is not present. No edema present. No thyroid mass and no thyromegaly present.  Cardiovascular: S1 normal and S2 normal. Exam reveals no gallop.  No murmur heard. Pulses:      Dorsalis pedis pulses are 2+ on the right side and 2+ on the left side.  Respiratory: No respiratory distress. He has no wheezes. He has no rhonchi. He has no rales.  GI: Soft. Bowel sounds are normal. There is abdominal tenderness.  Small spot on the right abdomen where prior port site was for the surgery has a little inflammation there.  Musculoskeletal:   Right ankle: He exhibits no swelling.     Left ankle: He exhibits no swelling.  Lymphadenopathy:    He has no cervical adenopathy.  Neurological: He is alert and oriented to person, place, and time. No cranial nerve deficit.  Skin: Skin is warm. No rash noted. Nails show no clubbing.  Psychiatric: He has a normal mood and affect.      Data Reviewed: Basic Metabolic Panel: Recent Labs  Lab 06/04/18 0434  NA 140  K 4.2  CL 109  CO2 25  GLUCOSE 101*  BUN 26*  CREATININE 1.81*  CALCIUM 8.4*   Liver Function Tests: No results for input(s): AST, ALT, ALKPHOS, BILITOT, PROT, ALBUMIN in the last 168 hours. No results for input(s): LIPASE, AMYLASE in the last 168 hours. CBC: No results for input(s): WBC, NEUTROABS, HGB, HCT, MCV, PLT in the last 168 hours. Cardiac Enzymes: No results for input(s): CKTOTAL, CKMB, CKMBINDEX, TROPONINI in the last 168 hours. BNP (last 3 results) Recent Labs    11/19/17 0937  BNP 457.0*      Recent Results (from the past 240 hour(s))  SARS Coronavirus 2 (CEPHEID - Performed in [place] [facility] hospital lab), Hosp Order     Status: None   Collection Time: 05/28/18  7:23 PM  Result Value Ref Range Status   SARS Coronavirus 2 NEGATIVE NEGATIVE Final    Comment: (NOTE) If result is NEGATIVE SARS-CoV-2 target nucleic acids are NOT DETECTED. The SARS-CoV-2 RNA is generally detectable in upper and lower  respiratory specimens during the acute  phase of infection. The lowest  concentration of SARS-CoV-2 viral copies this assay can detect is 250  copies / mL. A negative result does not preclude SARS-CoV-2 infection  and should not be used as the sole basis for treatment or other  patient management decisions.  A negative result may occur with  improper specimen collection / handling, submission of specimen other  than nasopharyngeal swab, presence of viral mutation(s) within the  areas targeted by this assay, and inadequate number of viral copies  (<250  copies / mL). A negative result must be combined with clinical  observations, patient history, and epidemiological information. If result is POSITIVE SARS-CoV-2 target nucleic acids are DETECTED. The SARS-CoV-2 RNA is generally detectable in upper and lower  respiratory specimens dur ing the acute phase of infection.  Positive  results are indicative of active infection with SARS-CoV-2.  Clinical  correlation with patient history and other diagnostic information is  necessary to determine patient infection status.  Positive results do  not rule out bacterial infection or co-infection with other viruses. If result is PRESUMPTIVE POSTIVE SARS-CoV-2 nucleic acids MAY BE PRESENT.   A presumptive positive result was obtained on the submitted specimen  and confirmed on repeat testing.  While 2019 novel coronavirus  (SARS-CoV-2) nucleic acids may be present in the submitted sample  additional confirmatory testing may be necessary for epidemiological  and / or clinical management purposes  to differentiate between  SARS-CoV-2 and other Sarbecovirus currently known to infect humans.  If clinically indicated additional testing with an alternate test  methodology [id number]) is advised. The SARS-CoV-2 RNA is generally  detectable in upper and lower respiratory sp ecimens during the acute  phase of infection. The expected result is Negative. Fact Sheet for Patients:  StrictlyIdeas.no Fact Sheet for Healthcare Providers: BankingDealers.co.za This test is not yet approved or cleared by the [place] FDA and has been authorized for detection and/or diagnosis of SARS-CoV-2 by FDA under an Emergency Use Authorization (EUA).  This EUA will remain in effect (meaning this test can be used) for the duration of the COVID-19 declaration under Section 564(b)(1) of the Act, 21 U.S.C. section 360bbb-3(b)(1), unless the authorization is terminated or revoked  sooner. Performed at [facility], [place], [place] [address]   C Difficile Quick Screen w PCR reflex     Status: Abnormal   Collection Time: 05/29/18  1:23 PM  Result Value Ref Range Status   C Diff antigen POSITIVE (A) NEGATIVE Final   C Diff toxin POSITIVE (A) NEGATIVE Final   C Diff interpretation Toxin producing C. difficile detected.  Final    CommentLarwance Rote AT 1433 05/29/2018 KMP Performed at [facility], [address], [place], [place] [address]       Scheduled Meds: . calcium-vitamin D  1 tablet Oral BID  . folic acid  1 mg Oral Daily  . mirtazapine  15 mg Oral QHS  . multivitamin with minerals  1 tablet Oral Daily  . pantoprazole  40 mg Oral BID  . Ensure Max Protein  11 oz Oral BID BM  . sodium chloride flush  3 mL Intravenous Q12H  . tamsulosin  0.4 mg Oral Daily  . thiamine  100 mg Oral Daily   Or  . thiamine  100 mg Intravenous Daily  . vancomycin  125 mg Oral QID   Continuous Infusions:  Assessment/Plan:  1. C. difficile colitis.  Patient with abdominal pain , continue current pain  management as needed .  Diarrhea has improved.  On p.o. vancomycin.  Enteric precautions 2. Chronic kidney disease stage III.  Monitor renal function while here in the hospital. 3. Recent surgery for bleeding duodenal ulcer.  JP drain removed.  Feeding tube not being used.  Seen by surgery.  Continue PPI. 4. History of alcohol abuse on thiamine 5. History of hypertension.  Blood pressure on the lower side I stopped all antihypertensives. 6. Patient lacks capacity to make good medical decisions.  I do not have a good disposition at this point.  Son will not take him to his house at this time.  Awaiting for guardianship from DSS follow-up with social worker   Code Status:     Code Status Orders  (From admission, onward)         Start     Ordered   05/29/18 0016  Do not attempt resuscitation (DNR)  Continuous    Question  Answer Comment  In the event of cardiac or respiratory ARREST Do not call a "code blue"   In the event of cardiac or respiratory ARREST Do not perform Intubation, CPR, defibrillation or ACLS   In the event of cardiac or respiratory ARREST Use medication by any route, position, wound care, and other measures to relive pain and suffering. May use oxygen, suction and manual treatment of airway obstruction as needed for comfort.   Comments per patient's only child the patient is to be DNR per his father's prior expressed wishes. Still ok to receive blood, antibiotics, and other care but NO intubation and NO CPR      05/29/18 0015        Code Status History    Date Active Date Inactive Code Status Order ID Comments User Context   05/29/2018 0016 05/29/2018 0016 DNR [id number]  Mayer Camel, NP ED   05/02/2018 1427 05/26/2018 2140 DNR [id number]  Vaughan Basta, MD Inpatient   05/02/2018 0804 05/02/2018 1427 DNR [id number]  Delman Kitten, MD ED   03/13/2018 1929 03/14/2018 2030 Full Code [id number]  Nena Polio, MD ED   11/30/2017 1846 12/01/2017 2040 Full Code [id number]  Thornton Park, MD Inpatient   11/30/2017 1601 11/30/2017 1846 Full Code [id number]  Demetrios Loll, MD Inpatient     Family Communication: Spoke with son on the phone yesterday Disposition Plan: As per psychiatry, patient lacks the capacity to make decisions for himself at this point.  I gave this message to the care manager team and social work team.  They will look into other options.  Time spent: 28 minutes  [place]

## 2018-06-07 NOTE — Plan of Care (Signed)

## 2018-06-07 NOTE — Progress Notes (Signed)
Patient ID: Justin Andrews, male   DOB: 04/03/1934, 84 y.o.   MRN: [id number]  [facility] PROGRESS NOTE  JERMONE GEISTER IPJ:[id number] DOB: Jul 07, 1934 DOA: 05/28/2018 PCP: Leone Haven, MD  HPI/Subjective: Patient is pleasant today, no overnight incidents, he was deemed incompetent by the psychiatrist 06/04/2018.   Objective: Vitals:   06/07/18 0800 06/07/18 0849  BP: 116/62 123/66  Pulse: 94 97  Resp:  16  Temp: 98.4 F (36.9 C)   SpO2: 95% 95%    Intake/Output Summary (Last 24 hours) at 06/07/2018 1549 Last data filed at 06/07/2018 1027 Gross per 24 hour  Intake 140 ml  Output -  Net 140 ml   Filed Weights   05/28/18 1638 05/29/18 0615  Weight: 49.9 kg 54.9 kg    ROS: Review of Systems  Constitutional: Negative for chills and fever.  Eyes: Negative for blurred vision.  Respiratory: Negative for cough and shortness of breath.   Cardiovascular: Negative for chest pain.  Gastrointestinal: Positive for abdominal pain. Negative for constipation, diarrhea, nausea and vomiting.  Genitourinary: Negative for dysuria.  Musculoskeletal: Negative for joint pain.  Neurological: Negative for dizziness and headaches.   Exam: Physical Exam  Constitutional: He is oriented to person, place, and time.  HENT:  Nose: No mucosal edema.  Mouth/Throat: No oropharyngeal exudate or posterior oropharyngeal edema.  Eyes: Pupils are equal, round, and reactive to light. Conjunctivae, EOM and lids are normal.  Neck: No JVD present. Carotid bruit is not present. No edema present. No thyroid mass and no thyromegaly present.  Cardiovascular: S1 normal and S2 normal. Exam reveals no gallop.  No murmur heard. Pulses:      Dorsalis pedis pulses are 2+ on the right side and 2+ on the left side.  Respiratory: No respiratory distress. He has no wheezes. He has no rhonchi. He has no rales.  GI: Soft. Bowel sounds are normal. There is abdominal tenderness.  Small spot on the right abdomen where  prior port site was for the surgery has a little inflammation there.  Musculoskeletal:     Right ankle: He exhibits no swelling.     Left ankle: He exhibits no swelling.  Lymphadenopathy:    He has no cervical adenopathy.  Neurological: He is alert and oriented to person, place, and time. No cranial nerve deficit.  Skin: Skin is warm. No rash noted. Nails show no clubbing.  Psychiatric: He has a normal mood and affect.      Data Reviewed: Basic Metabolic Panel: Recent Labs  Lab 06/04/18 0434  NA 140  K 4.2  CL 109  CO2 25  GLUCOSE 101*  BUN 26*  CREATININE 1.81*  CALCIUM 8.4*   Liver Function Tests: No results for input(s): AST, ALT, ALKPHOS, BILITOT, PROT, ALBUMIN in the last 168 hours. No results for input(s): LIPASE, AMYLASE in the last 168 hours. CBC: No results for input(s): WBC, NEUTROABS, HGB, HCT, MCV, PLT in the last 168 hours. Cardiac Enzymes: No results for input(s): CKTOTAL, CKMB, CKMBINDEX, TROPONINI in the last 168 hours. BNP (last 3 results) Recent Labs    11/19/17 0937  BNP 457.0*      Recent Results (from the past 240 hour(s))  SARS Coronavirus 2 (CEPHEID - Performed in [facility] hospital lab), Hosp Order     Status: None   Collection Time: 05/28/18  7:23 PM  Result Value Ref Range Status   SARS Coronavirus 2 NEGATIVE NEGATIVE Final    Comment: (NOTE) If result is NEGATIVE  SARS-CoV-2 target nucleic acids are NOT DETECTED. The SARS-CoV-2 RNA is generally detectable in upper and lower  respiratory specimens during the acute phase of infection. The lowest  concentration of SARS-CoV-2 viral copies this assay can detect is 250  copies / mL. A negative result does not preclude SARS-CoV-2 infection  and should not be used as the sole basis for treatment or other  patient management decisions.  A negative result may occur with  improper specimen collection / handling, submission of specimen other  than nasopharyngeal swab, presence of viral  mutation(s) within the  areas targeted by this assay, and inadequate number of viral copies  (<250 copies / mL). A negative result must be combined with clinical  observations, patient history, and epidemiological information. If result is POSITIVE SARS-CoV-2 target nucleic acids are DETECTED. The SARS-CoV-2 RNA is generally detectable in upper and lower  respiratory specimens dur ing the acute phase of infection.  Positive  results are indicative of active infection with SARS-CoV-2.  Clinical  correlation with patient history and other diagnostic information is  necessary to determine patient infection status.  Positive results do  not rule out bacterial infection or co-infection with other viruses. If result is PRESUMPTIVE POSTIVE SARS-CoV-2 nucleic acids MAY BE PRESENT.   A presumptive positive result was obtained on the submitted specimen  and confirmed on repeat testing.  While 2019 novel coronavirus  (SARS-CoV-2) nucleic acids may be present in the submitted sample  additional confirmatory testing may be necessary for epidemiological  and / or clinical management purposes  to differentiate between  SARS-CoV-2 and other Sarbecovirus currently known to infect humans.  If clinically indicated additional testing with an alternate test  methodology [phone]) is advised. The SARS-CoV-2 RNA is generally  detectable in upper and lower respiratory sp ecimens during the acute  phase of infection. The expected result is Negative. Fact Sheet for Patients:  StrictlyIdeas.no Fact Sheet for Healthcare Providers: BankingDealers.co.za This test is not yet approved or cleared by the [place] FDA and has been authorized for detection and/or diagnosis of SARS-CoV-2 by FDA under an Emergency Use Authorization (EUA).  This EUA will remain in effect (meaning this test can be used) for the duration of the COVID-19 declaration under Section 564(b)(1)  of the Act, 21 U.S.C. section 360bbb-3(b)(1), unless the authorization is terminated or revoked sooner. Performed at [facility], [place], [place] [address]   C Difficile Quick Screen w PCR reflex     Status: Abnormal   Collection Time: 05/29/18  1:23 PM  Result Value Ref Range Status   C Diff antigen POSITIVE (A) NEGATIVE Final   C Diff toxin POSITIVE (A) NEGATIVE Final   C Diff interpretation Toxin producing C. difficile detected.  Final    CommentLarwance Rote AT 1433 05/29/2018 KMP Performed at [facility], [address], [place],  [address]       Scheduled Meds: . calcium-vitamin D  1 tablet Oral BID  . folic acid  1 mg Oral Daily  . mirtazapine  15 mg Oral QHS  . multivitamin with minerals  1 tablet Oral Daily  . pantoprazole  40 mg Oral BID  . Ensure Max Protein  11 oz Oral BID BM  . sodium chloride flush  3 mL Intravenous Q12H  . tamsulosin  0.4 mg Oral Daily  . thiamine  100 mg Oral Daily   Or  . thiamine  100 mg Intravenous Daily  . vancomycin  125  mg Oral QID   Continuous Infusions:  Assessment/Plan:  1. C. difficile colitis.  Patient with improved abdominal pain , continue current pain management as needed .  Diarrhea has improved.  On p.o. vancomycin.  Enteric precautions 2. Chronic kidney disease stage III.  Monitor renal function while here in the hospital.  Check a.m. labs 3. Recent surgery for bleeding duodenal ulcer.  JP drain removed.  Feeding tube not being used.  Seen by surgery.  Continue PPI. 4. History of alcohol abuse on thiamine 5. History of hypertension.  Blood pressure on the lower side.  Holding all antihypertensive meds.  Continue close monitoring of the blood pressure. 6. Patient lacks capacity to make good medical decisions.  I do not have a good disposition at this point.  Son will not take him to his house at this time.  Awaiting for guardianship from DSS follow-up with social  worker   Code Status:     Code Status Orders  (From admission, onward)         Start     Ordered   05/29/18 0016  Do not attempt resuscitation (DNR)  Continuous    Question Answer Comment  In the event of cardiac or respiratory ARREST Do not call a "code blue"   In the event of cardiac or respiratory ARREST Do not perform Intubation, CPR, defibrillation or ACLS   In the event of cardiac or respiratory ARREST Use medication by any route, position, wound care, and other measures to relive pain and suffering. May use oxygen, suction and manual treatment of airway obstruction as needed for comfort.   Comments per patient's only child the patient is to be DNR per his father's prior expressed wishes. Still ok to receive blood, antibiotics, and other care but NO intubation and NO CPR      05/29/18 0015        Code Status History    Date Active Date Inactive Code Status Order ID Comments User Context   05/29/2018 0016 05/29/2018 0016 DNR [id number]  Mayer Camel, NP ED   05/02/2018 1427 05/26/2018 2140 DNR [id number]  Vaughan Basta, MD Inpatient   05/02/2018 0804 05/02/2018 1427 DNR [id number]  Delman Kitten, MD ED   03/13/2018 1929 03/14/2018 2030 Full Code [id number]  Nena Polio, MD ED   11/30/2017 1846 12/01/2017 2040 Full Code [id number]  Thornton Park, MD Inpatient   11/30/2017 1601 11/30/2017 1846 Full Code [id number]  Demetrios Loll, MD Inpatient     Family Communication: Son and other family medicine not involved in his care Disposition Plan: As per psychiatry, patient lacks the capacity to make decisions for himself at this point.  I gave this message to the care manager team and social work team.  They will look into other options.  Time spent: 28 minutes  [place]

## 2018-06-08 LAB — CBC
HCT: 26.7 % — ABNORMAL LOW (ref 39.0–52.0)
Hemoglobin: 8.6 g/dL — ABNORMAL LOW (ref 13.0–17.0)
MCH: 29.1 pg (ref 26.0–34.0)
MCHC: 32.2 g/dL (ref 30.0–36.0)
MCV: 90.2 fL (ref 80.0–100.0)
Platelets: 253 10*3/uL (ref 150–400)
RBC: 2.96 MIL/uL — ABNORMAL LOW (ref 4.22–5.81)
RDW: 15.5 % (ref 11.5–15.5)
WBC: 10.2 10*3/uL (ref 4.0–10.5)
nRBC: 0 % (ref 0.0–0.2)

## 2018-06-08 LAB — BASIC METABOLIC PANEL
Anion gap: 6 (ref 5–15)
BUN: 30 mg/dL — ABNORMAL HIGH (ref 8–23)
CO2: 27 mmol/L (ref 22–32)
Calcium: 9.1 mg/dL (ref 8.9–10.3)
Chloride: 105 mmol/L (ref 98–111)
Creatinine, Ser: 2.18 mg/dL — ABNORMAL HIGH (ref 0.61–1.24)
GFR calc Af Amer: 32 mL/min — ABNORMAL LOW (ref >60)
GFR calc non Af Amer: 27 mL/min — ABNORMAL LOW (ref >60)
Glucose, Bld: 98 mg/dL (ref 70–99)
Potassium: 4.2 mmol/L (ref 3.5–5.1)
Sodium: 138 mmol/L (ref 135–145)

## 2018-06-08 MED ORDER — ENOXAPARIN SODIUM 30 MG/0.3ML ~~LOC~~ SOLN
30.0000 mg | SUBCUTANEOUS | Status: DC
  Administered 2018-06-08 – 2018-06-16 (×9): 30 mg via SUBCUTANEOUS
  Filled 2018-06-08 (×9): qty 0.3

## 2018-06-08 MED ORDER — SODIUM CHLORIDE 0.9 % IV SOLN
INTRAVENOUS | Status: DC
  Administered 2018-06-08 (×2): via INTRAVENOUS

## 2018-06-08 MED ORDER — TRAMADOL HCL 50 MG PO TABS
50.0000 mg | ORAL_TABLET | Freq: Four times a day (QID) | ORAL | Status: DC | PRN
  Administered 2018-06-08 – 2018-06-17 (×23): 50 mg via ORAL
  Filled 2018-06-08 (×23): qty 1

## 2018-06-08 NOTE — Progress Notes (Signed)
Patient ID: Justin Andrews, male   DOB: 04/12/1934, 84 y.o.   MRN: [id number]  [facility] PROGRESS NOTE  Justin Andrews [id number]:[id number] DOB: 1934-08-25 DOA: 05/28/2018 PCP: Leone Haven, MD  HPI/Subjective: Patient is pleasant today, no overnight incidents, he was deemed incompetent by the psychiatrist 06/04/2018. Awaiting guardianship  Objective: Vitals:   06/07/18 1944 06/08/18 0623  BP: (!) 117/58 (!) 118/58  Pulse: 92 82  Resp: 18 18  Temp:  98.3 F (36.8 C)  SpO2: 94% 93%    Intake/Output Summary (Last 24 hours) at 06/08/2018 1720 Last data filed at 06/08/2018 1500 Gross per 24 hour  Intake 243.96 ml  Output -  Net 243.96 ml   Filed Weights   05/28/18 1638 05/29/18 0615  Weight: 49.9 kg 54.9 kg    ROS: Review of Systems  Constitutional: Negative for chills and fever.  Eyes: Negative for blurred vision.  Respiratory: Negative for cough and shortness of breath.   Cardiovascular: Negative for chest pain.  Gastrointestinal: Negative for abdominal pain, constipation, diarrhea, nausea and vomiting.  Genitourinary: Negative for dysuria.  Musculoskeletal: Negative for joint pain.  Neurological: Negative for dizziness and headaches.   Exam: Physical Exam  Constitutional: He is oriented to person, place, and time.  HENT:  Nose: No mucosal edema.  Mouth/Throat: No oropharyngeal exudate or posterior oropharyngeal edema.  Eyes: Pupils are equal, round, and reactive to light. Conjunctivae, EOM and lids are normal.  Neck: No JVD present. Carotid bruit is not present. No edema present. No thyroid mass and no thyromegaly present.  Cardiovascular: S1 normal and S2 normal. Exam reveals no gallop.  No murmur heard. Pulses:      Dorsalis pedis pulses are 2+ on the right side and 2+ on the left side.  Respiratory: No respiratory distress. He has no wheezes. He has no rhonchi. He has no rales.  GI: Soft. Bowel sounds are normal. There is no abdominal tenderness.  Small spot on  the right abdomen where prior port site was for the surgery has a little inflammation there.  Musculoskeletal:     Right ankle: He exhibits no swelling.     Left ankle: He exhibits no swelling.  Lymphadenopathy:    He has no cervical adenopathy.  Neurological: He is alert and oriented to person, place, and time. No cranial nerve deficit.  Skin: Skin is warm. No rash noted. Nails show no clubbing.  Psychiatric: He has a normal mood and affect.      Data Reviewed: Basic Metabolic Panel: Recent Labs  Lab 06/04/18 0434 06/08/18 0348  NA 140 138  K 4.2 4.2  CL 109 105  CO2 25 27  GLUCOSE 101* 98  BUN 26* 30*  CREATININE 1.81* 2.18*  CALCIUM 8.4* 9.1   Liver Function Tests: No results for input(s): AST, ALT, ALKPHOS, BILITOT, PROT, ALBUMIN in the last 168 hours. No results for input(s): LIPASE, AMYLASE in the last 168 hours. CBC: Recent Labs  Lab 06/08/18 0348  WBC 10.2  HGB 8.6*  HCT 26.7*  MCV 90.2  PLT 253   Cardiac Enzymes: No results for input(s): CKTOTAL, CKMB, CKMBINDEX, TROPONINI in the last 168 hours. BNP (last 3 results) Recent Labs    11/19/17 0937  BNP 457.0*      No results found for this or any previous visit (from the past 240 hour(s)).    Scheduled Meds: . calcium-vitamin D  1 tablet Oral BID  . enoxaparin (LOVENOX) injection  30 mg Subcutaneous Q24H  .  folic acid  1 mg Oral Daily  . mirtazapine  15 mg Oral QHS  . multivitamin with minerals  1 tablet Oral Daily  . pantoprazole  40 mg Oral BID  . Ensure Max Protein  11 oz Oral BID BM  . sodium chloride flush  3 mL Intravenous Q12H  . tamsulosin  0.4 mg Oral Daily  . thiamine  100 mg Oral Daily   Or  . thiamine  100 mg Intravenous Daily   Continuous Infusions: . sodium chloride 75 mL/hr at 06/08/18 1500    Assessment/Plan:  1. C. difficile colitis.  Patient with improved abdominal pain , continue current pain management as needed .  Diarrhea has improved.  On p.o. vancomycin.  Enteric  precautions 2. AKI on Chronic kidney disease stage III.  Monitor renal function while here in the hospital. Base line criteria 1.8 and is at 2.18 , gentle hydration with IVF today  Check a.m. labs 3. Recent surgery for bleeding duodenal ulcer.  JP drain removed.  Feeding tube not being used.  Seen by surgery.  Continue PPI. 4. History of alcohol abuse on thiamine 5. History of hypertension.  Blood pressure on the lower side.  Holding all antihypertensive meds.  Continue close monitoring of the blood pressure. 6. Patient lacks capacity to make good medical decisions.  I do not have a good disposition at this point.  Son will not take him to his house at this time.  Awaiting for guardianship from DSS follow-up with social worker, probably not going to happen  until July    Code Status:     Code Status Orders  (From admission, onward)         Start     Ordered   05/29/18 0016  Do not attempt resuscitation (DNR)  Continuous    Question Answer Comment  In the event of cardiac or respiratory ARREST Do not call a "code blue"   In the event of cardiac or respiratory ARREST Do not perform Intubation, CPR, defibrillation or ACLS   In the event of cardiac or respiratory ARREST Use medication by any route, position, wound care, and other measures to relive pain and suffering. May use oxygen, suction and manual treatment of airway obstruction as needed for comfort.   Comments per patient's only child the patient is to be DNR per his father's prior expressed wishes. Still ok to receive blood, antibiotics, and other care but NO intubation and NO CPR      05/29/18 0015        Code Status History    Date Active Date Inactive Code Status Order ID Comments User Context   05/29/2018 0016 05/29/2018 0016 DNR [id number]  Mayer Camel, NP ED   05/02/2018 1427 05/26/2018 2140 DNR [id number]  Vaughan Basta, MD Inpatient   05/02/2018 0804 05/02/2018 1427 DNR [id number]  Delman Kitten, MD ED   03/13/2018 1929  03/14/2018 2030 Full Code [id number]  Nena Polio, MD ED   11/30/2017 1846 12/01/2017 2040 Full Code [id number]  Thornton Park, MD Inpatient   11/30/2017 1601 11/30/2017 1846 Full Code [id number]  Demetrios Loll, MD Inpatient     Family Communication: Son and other family medicine not involved in his care Disposition Plan: As per psychiatry, patient lacks the capacity to make decisions for himself at this point.  I gave this message to the care manager team and social work team.  They will look into other options.  Time spent: 28 minutes  Daouda Lonzo  [facility]

## 2018-06-08 NOTE — Care Management Important Message (Signed)
Important Message  Patient Details  Name: Justin Andrews MRN: [id number] Date of Birth: 03-21-1934   Medicare Important Message Given:  Yes    Annamaria Boots, Latanya Presser 06/08/2018, 2:18 PM

## 2018-06-09 LAB — BASIC METABOLIC PANEL
Anion gap: 5 (ref 5–15)
BUN: 28 mg/dL — ABNORMAL HIGH (ref 8–23)
CO2: 26 mmol/L (ref 22–32)
Calcium: 8.4 mg/dL — ABNORMAL LOW (ref 8.9–10.3)
Chloride: 107 mmol/L (ref 98–111)
Creatinine, Ser: 2.05 mg/dL — ABNORMAL HIGH (ref 0.61–1.24)
GFR calc Af Amer: 34 mL/min — ABNORMAL LOW (ref >60)
GFR calc non Af Amer: 29 mL/min — ABNORMAL LOW (ref >60)
Glucose, Bld: 82 mg/dL (ref 70–99)
Potassium: 3.9 mmol/L (ref 3.5–5.1)
Sodium: 138 mmol/L (ref 135–145)

## 2018-06-09 NOTE — Progress Notes (Signed)
Patient ID: Justin Andrews, male   DOB: 1934/06/29, 84 y.o.   MRN: [id number]  [facility] PROGRESS NOTE  Justin Andrews [id number] DOB: [birth year] DOA: 05/28/2018 PCP: Leone Haven, MD  HPI/Subjective: Patient is pleasant today, no overnight incidents, he was deemed incompetent by the psychiatrist 06/04/2018. Awaiting guardianship  Objective: Vitals:   06/09/18 0445 06/09/18 1514  BP: (!) 144/71 (!) 116/50  Pulse: 90 83  Resp: 20 18  Temp: 98.3 F (36.8 C) 98.5 F (36.9 C)  SpO2: 96% 97%    Intake/Output Summary (Last 24 hours) at 06/09/2018 1603 Last data filed at 06/08/2018 2300 Gross per 24 hour  Intake 599.95 ml  Output -  Net 599.95 ml   Filed Weights   05/28/18 1638 05/29/18 0615  Weight: 49.9 kg 54.9 kg    ROS: Review of Systems  Constitutional: Negative for chills and fever.  Eyes: Negative for blurred vision.  Respiratory: Negative for cough and shortness of breath.   Cardiovascular: Negative for chest pain.  Gastrointestinal: Negative for abdominal pain, constipation, diarrhea, nausea and vomiting.  Genitourinary: Negative for dysuria.  Musculoskeletal: Negative for joint pain.  Neurological: Negative for dizziness and headaches.   Exam: Physical Exam  Constitutional: He is oriented to person, place, and time.  HENT:  Nose: No mucosal edema.  Mouth/Throat: No oropharyngeal exudate or posterior oropharyngeal edema.  Eyes: Pupils are equal, round, and reactive to light. Conjunctivae, EOM and lids are normal.  Neck: No JVD present. Carotid bruit is not present. No edema present. No thyroid mass and no thyromegaly present.  Cardiovascular: S1 normal and S2 normal. Exam reveals no gallop.  No murmur heard. Pulses:      Dorsalis pedis pulses are 2+ on the right side and 2+ on the left side.  Respiratory: No respiratory distress. He has no wheezes. He has no rhonchi. He has no rales.  GI: Soft. Bowel sounds are normal. There is no abdominal tenderness.   Small spot on the right abdomen where prior port site was for the surgery has a little inflammation there.  Musculoskeletal:     Right ankle: He exhibits no swelling.     Left ankle: He exhibits no swelling.  Lymphadenopathy:    He has no cervical adenopathy.  Neurological: He is alert and oriented to person, place, and time. No cranial nerve deficit.  Skin: Skin is warm. No rash noted. Nails show no clubbing.  Psychiatric: He has a normal mood and affect.      Data Reviewed: Basic Metabolic Panel: Recent Labs  Lab 06/04/18 0434 06/08/18 0348 06/09/18 0446  NA 140 138 138  K 4.2 4.2 3.9  CL 109 105 107  CO2 25 27 26   GLUCOSE 101* 98 82  BUN 26* 30* 28*  CREATININE 1.81* 2.18* 2.05*  CALCIUM 8.4* 9.1 8.4*   Liver Function Tests: No results for input(s): AST, ALT, ALKPHOS, BILITOT, PROT, ALBUMIN in the last 168 hours. No results for input(s): LIPASE, AMYLASE in the last 168 hours. CBC: Recent Labs  Lab 06/08/18 0348  WBC 10.2  HGB 8.6*  HCT 26.7*  MCV 90.2  PLT 253   Cardiac Enzymes: No results for input(s): CKTOTAL, CKMB, CKMBINDEX, TROPONINI in the last 168 hours. BNP (last 3 results) Recent Labs    11/19/17 0937  BNP 457.0*      No results found for this or any previous visit (from the past 240 hour(s)).    Scheduled Meds: . calcium-vitamin D  1 tablet  Oral BID  . enoxaparin (LOVENOX) injection  30 mg Subcutaneous Q24H  . folic acid  1 mg Oral Daily  . mirtazapine  15 mg Oral QHS  . multivitamin with minerals  1 tablet Oral Daily  . pantoprazole  40 mg Oral BID  . Ensure Max Protein  11 oz Oral BID BM  . sodium chloride flush  3 mL Intravenous Q12H  . tamsulosin  0.4 mg Oral Daily  . thiamine  100 mg Oral Daily   Or  . thiamine  100 mg Intravenous Daily   Continuous Infusions:   Assessment/Plan:  1. C. difficile colitis.  Patient with improved abdominal pain , continue current pain management as needed .  Diarrhea has improved.  On p.o.  vancomycin.  Enteric precautions 2. AKI on Chronic kidney disease stage III.  Monitor renal function while here in the hospital. Base line criteria 1.8 and is at 2.18--0.05, gentle hydration with IVF provided 3. Recent surgery for bleeding duodenal ulcer.  JP drain removed.  Feeding tube not being used.  Seen by surgery.  Continue PPI. 4. History of alcohol abuse-ciwa 5. History of hypertension.  Blood pressure on the lower side.  Holding all antihypertensive meds.  Continue close monitoring of the blood pressure. 6. Patient lacks capacity to make good medical decisions.  I do not have a good disposition at this point.  Son will not take him to his house at this time.  Awaiting for guardianship from DSS follow-up with social worker, probably not going to happen  until July    Code Status:     Code Status Orders  (From admission, onward)         Start     Ordered   05/29/18 0016  Do not attempt resuscitation (DNR)  Continuous    Question Answer Comment  In the event of cardiac or respiratory ARREST Do not call a "code blue"   In the event of cardiac or respiratory ARREST Do not perform Intubation, CPR, defibrillation or ACLS   In the event of cardiac or respiratory ARREST Use medication by any route, position, wound care, and other measures to relive pain and suffering. May use oxygen, suction and manual treatment of airway obstruction as needed for comfort.   Comments per patient's only child the patient is to be DNR per his father's prior expressed wishes. Still ok to receive blood, antibiotics, and other care but NO intubation and NO CPR      05/29/18 0015        Code Status History    Date Active Date Inactive Code Status Order ID Comments User Context   05/29/2018 0016 05/29/2018 0016 DNR [id number]  Mayer Camel, NP ED   05/02/2018 1427 05/26/2018 2140 DNR [id number]  Vaughan Basta, MD Inpatient   05/02/2018 0804 05/02/2018 1427 DNR [id number]  Delman Kitten, MD ED   03/13/2018  1929 03/14/2018 2030 Full Code [id number]  Nena Polio, MD ED   11/30/2017 1846 12/01/2017 2040 Full Code [id number]  Thornton Park, MD Inpatient   11/30/2017 1601 11/30/2017 1846 Full Code [id number]  Demetrios Loll, MD Inpatient     Family Communication: Son and other family medicine not involved in his care Disposition Plan: As per psychiatry, patient lacks the capacity to make decisions for himself at this point.  I gave this message to the care manager team and social work team.  They will look into other options.  Time spent: 28 minutes  [place]  [facility]

## 2018-06-09 NOTE — TOC Progression Note (Signed)
Transition of Care [facility]) - Progression Note    Patient Details  Name: Justin Andrews MRN: [id number] Date of Birth: 12-05-1934  Transition of Care [facility] CM/SW Contact  Marshell Garfinkel, RN Phone Number: 06/09/2018, 2:06 PM  Clinical Narrative:    RNCM reached out to APS for [name] or [name] is patient Education officer, museum- Message left for both.    Expected Discharge Plan: Group Home([facility]) Barriers to Discharge: Continued Medical Work up  Expected Discharge Plan and Services Expected Discharge Plan: Group Home([facility] [place])   Discharge Planning Services: CM Consult   Living arrangements for the past 2 months: Group Home                                       Social Determinants of Health (SDOH) Interventions    Readmission Risk Interventions Readmission Risk Prevention Plan 05/07/2018  Transportation Screening Complete  PCP or Specialist Appt within 3-5 Days Complete  HRI or Home Care Consult Complete  Medication Review (RN Care Manager) Complete  Some recent data might be hidden

## 2018-06-10 NOTE — Progress Notes (Signed)
Patient ID: Justin Andrews, male   DOB: Mar 13, 1934, 84 y.o.   MRN: [id number]  [facility] PROGRESS NOTE  Justin Andrews [id number] DOB: Oct 11, 1934 DOA: 05/28/2018 PCP: Leone Haven, MD  HPI/Subjective: Patient is asking for pain medicine, no overnight incidents, he was deemed incompetent by the psychiatrist 06/04/2018. Awaiting guardianship  Objective: Vitals:   06/10/18 0536 06/10/18 1601  BP: (!) 126/57 122/61  Pulse: 89 86  Resp: 16 18  Temp: 97.7 F (36.5 C) 98.7 F (37.1 C)  SpO2: 95% 97%   No intake or output data in the 24 hours ending 06/10/18 1612 Filed Weights   05/28/18 1638 05/29/18 0615  Weight: 49.9 kg 54.9 kg    ROS: Review of Systems  Constitutional: Negative for chills and fever.  Eyes: Negative for blurred vision.  Respiratory: Negative for cough and shortness of breath.   Cardiovascular: Negative for chest pain.  Gastrointestinal: Negative for abdominal pain, constipation, diarrhea, nausea and vomiting.  Genitourinary: Negative for dysuria.  Musculoskeletal: Negative for joint pain.  Neurological: Negative for dizziness and headaches.   Exam: Physical Exam  Constitutional: He is oriented to person, place, and time.  HENT:  Nose: No mucosal edema.  Mouth/Throat: No oropharyngeal exudate or posterior oropharyngeal edema.  Eyes: Pupils are equal, round, and reactive to light. Conjunctivae, EOM and lids are normal.  Neck: No JVD present. Carotid bruit is not present. No edema present. No thyroid mass and no thyromegaly present.  Cardiovascular: S1 normal and S2 normal. Exam reveals no gallop.  No murmur heard. Pulses:      Dorsalis pedis pulses are 2+ on the right side and 2+ on the left side.  Respiratory: No respiratory distress. He has no wheezes. He has no rhonchi. He has no rales.  GI: Soft. Bowel sounds are normal. There is no abdominal tenderness.  Small spot on the right abdomen where prior port site was for the surgery has a little  inflammation there.  Musculoskeletal:     Right ankle: He exhibits no swelling.     Left ankle: He exhibits no swelling.  Lymphadenopathy:    He has no cervical adenopathy.  Neurological: He is alert and oriented to person, place, and time. No cranial nerve deficit.  Skin: Skin is warm. No rash noted. Nails show no clubbing.  Psychiatric: He has a normal mood and affect.      Data Reviewed: Basic Metabolic Panel: Recent Labs  Lab 06/04/18 0434 06/08/18 0348 06/09/18 0446  NA 140 138 138  K 4.2 4.2 3.9  CL 109 105 107  CO2 25 27 26   GLUCOSE 101* 98 82  BUN 26* 30* 28*  CREATININE 1.81* 2.18* 2.05*  CALCIUM 8.4* 9.1 8.4*   Liver Function Tests: No results for input(s): AST, ALT, ALKPHOS, BILITOT, PROT, ALBUMIN in the last 168 hours. No results for input(s): LIPASE, AMYLASE in the last 168 hours. CBC: Recent Labs  Lab 06/08/18 0348  WBC 10.2  HGB 8.6*  HCT 26.7*  MCV 90.2  PLT 253   Cardiac Enzymes: No results for input(s): CKTOTAL, CKMB, CKMBINDEX, TROPONINI in the last 168 hours. BNP (last 3 results) Recent Labs    11/19/17 0937  BNP 457.0*      No results found for this or any previous visit (from the past 240 hour(s)).    Scheduled Meds: . calcium-vitamin D  1 tablet Oral BID  . enoxaparin (LOVENOX) injection  30 mg Subcutaneous Q24H  . folic acid  1 mg  Oral Daily  . mirtazapine  15 mg Oral QHS  . multivitamin with minerals  1 tablet Oral Daily  . pantoprazole  40 mg Oral BID  . Ensure Max Protein  11 oz Oral BID BM  . sodium chloride flush  3 mL Intravenous Q12H  . tamsulosin  0.4 mg Oral Daily  . thiamine  100 mg Oral Daily   Or  . thiamine  100 mg Intravenous Daily   Continuous Infusions:   Assessment/Plan:  1. C. difficile colitis.  Patient with improved abdominal pain , continue current pain management as needed .  Diarrhea has improved.  On p.o. vancomycin.  Will discontinue p.o. vancomycin after 14-day of course enteric  precautions 2. AKI on Chronic kidney disease stage III.  Monitor renal function while here in the hospital. Base line criteria 1.8 and is at 2.18--0.05, gentle hydration with IVF provided 3. Recent surgery for bleeding duodenal ulcer.  JP drain removed.  Feeding tube not being used.  Seen by surgery.  Continue PPI. 4. History of alcohol abuse-ciwa 5. History of hypertension.  Blood pressure on the lower side.  Holding all antihypertensive meds.  Continue close monitoring of the blood pressure. 6. Patient lacks capacity to make good medical decisions.  I do not have a good disposition at this point.  Son will not take him to his house at this time.  Awaiting for guardianship from DSS follow-up with social worker, probably not going to happen  until July    Code Status:     Code Status Orders  (From admission, onward)         Start     Ordered   05/29/18 0016  Do not attempt resuscitation (DNR)  Continuous    Question Answer Comment  In the event of cardiac or respiratory ARREST Do not call a "code blue"   In the event of cardiac or respiratory ARREST Do not perform Intubation, CPR, defibrillation or ACLS   In the event of cardiac or respiratory ARREST Use medication by any route, position, wound care, and other measures to relive pain and suffering. May use oxygen, suction and manual treatment of airway obstruction as needed for comfort.   Comments per patient's only child the patient is to be DNR per his father's prior expressed wishes. Still ok to receive blood, antibiotics, and other care but NO intubation and NO CPR      05/29/18 0015        Code Status History    Date Active Date Inactive Code Status Order ID Comments User Context   05/29/2018 0016 05/29/2018 0016 DNR [id number]  Mayer Camel, NP ED   05/02/2018 1427 05/26/2018 2140 DNR [id number]  Vaughan Basta, MD Inpatient   05/02/2018 0804 05/02/2018 1427 DNR [id number]  Delman Kitten, MD ED   03/13/2018 1929 03/14/2018 2030  Full Code [id number]  Nena Polio, MD ED   11/30/2017 1846 12/01/2017 2040 Full Code [id number]  Thornton Park, MD Inpatient   11/30/2017 1601 11/30/2017 1846 Full Code [id number]  Demetrios Loll, MD Inpatient     Family Communication: Son and other family medicine not involved in his care Disposition Plan: As per psychiatry, patient lacks the capacity to make decisions for himself at this point.  I gave this message to the care manager team and social work team.  They will look into other options.  Time spent: 28 minutes  [place]

## 2018-06-10 NOTE — Progress Notes (Signed)
Nutrition Follow-up  RD working remotely.  DOCUMENTATION CODES:   Underweight  INTERVENTION:  Continue Ensure Max Protein po BID, each supplement provides 150 kcal and 30 grams of protein.  Continue Magic cup TID with meals, each supplement provides 290 kcal and 9 grams of protein.  Continue MVI daily, thiamine 696 mg daily, folic acid 1 mg daily.  NUTRITION DIAGNOSIS:   Inadequate oral intake related to acute illness as evidenced by other (comment)(per chart review ).  Ongoing.  GOAL:   Patient will meet greater than or equal to 90% of their needs  Progressing.  MONITOR:   Diet advancement, Labs, Weight trends, Skin, I & O's, TF tolerance  REASON FOR ASSESSMENT:   Malnutrition Screening Tool    ASSESSMENT:   84 y/o male with h/o HTN, OA, etoh abuse, CHF s/p exlap, repair of bleeding duodenal ulcer, duodenoplasty, and J tube placement on 5/1. Was discharged to group home on 5/19 and now returns with abdominal pain and diarrhea.    Spoke with patient over the phone. He reports his appetite is still not back to normal. However he is unable to describe how much of his meals he is eating. He is requesting [employer]. He reports he does like the "ice cream" on the trays [employer]) and that the "protein shakes aren't bad." Encouraged patient to continue taking ONS to help meet calorie/protein needs. Per chart patient was deemed incompetent to make medical decisions. Pending guardianship.  Medications reviewed and include: Oscal with D BID, folic acid 1 mg daily, Remeron 15 mg QHS, MVI daily, pantoprazole, thiamine 100 mg daily.  Labs reviewed: BUN 28, Creatinine 2.05.  Diet Order:   Diet Order            Diet regular Room service appropriate? Yes with Assist; Fluid consistency: Thin  Diet effective now             EDUCATION NEEDS:   Not appropriate for education at this time  Skin:  Skin Assessment: Reviewed RN Assessment(incision abdomen )  Last BM:  06/02/2018 -  medium type 6  Height:   Ht Readings from Last 1 Encounters:  05/29/18 5\' 8"  (1.727 m)   Weight:   Wt Readings from Last 1 Encounters:  05/29/18 54.9 kg   Ideal Body Weight:  70 kg  BMI:  Body mass index is 18.4 kg/m.  Estimated Nutritional Needs:   Kcal:  1600-1800kcal/day   Protein:  80-90g/day   Fluid:  >1.4L/day   Willey Blade, MS, RD, LDN Office: [phone] Pager: [phone] After Hours/Weekend Pager: [phone]

## 2018-06-10 NOTE — Care Management Important Message (Signed)
Important Message  Patient Details  Name: Justin Andrews MRN: [id number] Date of Birth: 26-Dec-1934   Medicare Important Message Given:  Yes    Annamaria Boots, Ar[place] 06/10/2018, 2:03 PM

## 2018-06-10 NOTE — Telephone Encounter (Signed)
Pt's son does want to speak with you about his dad, please advise.  Astoria Condon,cma

## 2018-06-11 ENCOUNTER — Telehealth: Payer: Self-pay | Admitting: *Deleted

## 2018-06-11 NOTE — Progress Notes (Signed)
Patient ID: Justin Andrews, male   DOB: 1934-09-04, 84 y.o.   MRN: [id number]  [facility] PROGRESS NOTE  ANTIONIO NEGRON TKW:[id number] DOB: [birth year] DOA: 05/28/2018 PCP: Leone Haven, MD  HPI/Subjective: Patient is doing fine, he was deemed incompetent by the psychiatrist 06/04/2018. Awaiting guardianship  Objective: Vitals:   06/11/18 0545 06/11/18 1547  BP: (!) 133/59 127/72  Pulse: 85 87  Resp: 16 20  Temp: 98.2 F (36.8 C) 98.2 F (36.8 C)  SpO2: 96% 97%   No intake or output data in the 24 hours ending 06/11/18 1637 Filed Weights   05/28/18 1638 05/29/18 0615  Weight: 49.9 kg 54.9 kg    ROS: Review of Systems  Constitutional: Negative for chills and fever.  Eyes: Negative for blurred vision.  Respiratory: Negative for cough and shortness of breath.   Cardiovascular: Negative for chest pain.  Gastrointestinal: Negative for abdominal pain, constipation, diarrhea, nausea and vomiting.  Genitourinary: Negative for dysuria.  Musculoskeletal: Negative for joint pain.  Neurological: Negative for dizziness and headaches.   Exam: Physical Exam  Constitutional: He is oriented to person, place, and time.  HENT:  Nose: No mucosal edema.  Mouth/Throat: No oropharyngeal exudate or posterior oropharyngeal edema.  Eyes: Pupils are equal, round, and reactive to light. Conjunctivae, EOM and lids are normal.  Neck: No JVD present. Carotid bruit is not present. No edema present. No thyroid mass and no thyromegaly present.  Cardiovascular: S1 normal and S2 normal. Exam reveals no gallop.  No murmur heard. Pulses:      Dorsalis pedis pulses are 2+ on the right side and 2+ on the left side.  Respiratory: No respiratory distress. He has no wheezes. He has no rhonchi. He has no rales.  GI: Soft. Bowel sounds are normal. There is no abdominal tenderness.  Small spot on the right abdomen where prior port site was for the surgery has a little inflammation there.  Musculoskeletal:      Right ankle: He exhibits no swelling.     Left ankle: He exhibits no swelling.  Lymphadenopathy:    He has no cervical adenopathy.  Neurological: He is alert and oriented to person, place, and time. No cranial nerve deficit.  Skin: Skin is warm. No rash noted. Nails show no clubbing.  Psychiatric: He has a normal mood and affect.      Data Reviewed: Basic Metabolic Panel: Recent Labs  Lab 06/08/18 0348 06/09/18 0446  NA 138 138  K 4.2 3.9  CL 105 107  CO2 27 26  GLUCOSE 98 82  BUN 30* 28*  CREATININE 2.18* 2.05*  CALCIUM 9.1 8.4*   Liver Function Tests: No results for input(s): AST, ALT, ALKPHOS, BILITOT, PROT, ALBUMIN in the last 168 hours. No results for input(s): LIPASE, AMYLASE in the last 168 hours. CBC: Recent Labs  Lab 06/08/18 0348  WBC 10.2  HGB 8.6*  HCT 26.7*  MCV 90.2  PLT 253   Cardiac Enzymes: No results for input(s): CKTOTAL, CKMB, CKMBINDEX, TROPONINI in the last 168 hours. BNP (last 3 results) Recent Labs    11/19/17 0937  BNP 457.0*      No results found for this or any previous visit (from the past 240 hour(s)).    Scheduled Meds: . calcium-vitamin D  1 tablet Oral BID  . enoxaparin (LOVENOX) injection  30 mg Subcutaneous Q24H  . folic acid  1 mg Oral Daily  . mirtazapine  15 mg Oral QHS  . multivitamin with minerals  1 tablet Oral Daily  . pantoprazole  40 mg Oral BID  . Ensure Max Protein  11 oz Oral BID BM  . sodium chloride flush  3 mL Intravenous Q12H  . tamsulosin  0.4 mg Oral Daily  . thiamine  100 mg Oral Daily   Or  . thiamine  100 mg Intravenous Daily   Continuous Infusions:   Assessment/Plan:  1. C. difficile colitis.  Patient with improved abdominal pain , continue current pain management as needed .  Diarrhea has improved.  On p.o. vancomycin.  Will discontinue p.o. vancomycin after 14-day of course enteric precautions 2. AKI on Chronic kidney disease stage III.  Monitor renal function while here in the  hospital. Base line criteria 1.8 and is at 2.18--2.05, gentle hydration with IVF provided 3. Recent surgery for bleeding duodenal ulcer.  JP drain removed.  Feeding tube not being used.  Seen by surgery.  Continue PPI. 4. History of alcohol abuse-ciwa 5. History of hypertension.  Blood pressure on the lower side.  Holding all antihypertensive meds.  Continue close monitoring of the blood pressure. 6. Patient lacks capacity to make good medical decisions.  I do not have a good disposition at this point.  Son will not take him to his house at this time.  Awaiting for guardianship from DSS follow-up with social worker, probably not going to happen  until July    Code Status:     Code Status Orders  (From admission, onward)         Start     Ordered   05/29/18 0016  Do not attempt resuscitation (DNR)  Continuous    Question Answer Comment  In the event of cardiac or respiratory ARREST Do not call a "code blue"   In the event of cardiac or respiratory ARREST Do not perform Intubation, CPR, defibrillation or ACLS   In the event of cardiac or respiratory ARREST Use medication by any route, position, wound care, and other measures to relive pain and suffering. May use oxygen, suction and manual treatment of airway obstruction as needed for comfort.   Comments per patient's only child the patient is to be DNR per his father's prior expressed wishes. Still ok to receive blood, antibiotics, and other care but NO intubation and NO CPR      05/29/18 0015        Code Status History    Date Active Date Inactive Code Status Order ID Comments User Context   05/29/2018 0016 05/29/2018 0016 DNR [id number]  Mayer Camel, NP ED   05/02/2018 1427 05/26/2018 2140 DNR [id number]  Vaughan Basta, MD Inpatient   05/02/2018 0804 05/02/2018 1427 DNR [id number]  Delman Kitten, MD ED   03/13/2018 1929 03/14/2018 2030 Full Code [id number]  Nena Polio, MD ED   11/30/2017 1846 12/01/2017 2040 Full Code [id number]   Thornton Park, MD Inpatient   11/30/2017 1601 11/30/2017 1846 Full Code [id number]  Demetrios Loll, MD Inpatient     Family Communication: Son and other family medicine not involved in his care Disposition Plan: As per psychiatry, patient lacks the capacity to make decisions for himself at this point.  I gave this message to the care manager team and social work team.  They will look into other options.  Time spent: 28 minutes  [place]

## 2018-06-11 NOTE — Telephone Encounter (Signed)
Copied from [place] [id number]. Topic: General - Other >> Jun 11, 2018  1:08 PM Oneta Rack wrote: Relation to pt: self  Call back number: Hospital room # [phone]     Reason for call:  Patient currently admitted to [facility] on 05/28/2018 and requested to speak with PCP to inform him of his condition.

## 2018-06-12 LAB — BASIC METABOLIC PANEL
Anion gap: 8 (ref 5–15)
BUN: 25 mg/dL — ABNORMAL HIGH (ref 8–23)
CO2: 24 mmol/L (ref 22–32)
Calcium: 8.7 mg/dL — ABNORMAL LOW (ref 8.9–10.3)
Chloride: 108 mmol/L (ref 98–111)
Creatinine, Ser: 2.14 mg/dL — ABNORMAL HIGH (ref 0.61–1.24)
GFR calc Af Amer: 32 mL/min — ABNORMAL LOW (ref >60)
GFR calc non Af Amer: 28 mL/min — ABNORMAL LOW (ref >60)
Glucose, Bld: 75 mg/dL (ref 70–99)
Potassium: 4 mmol/L (ref 3.5–5.1)
Sodium: 140 mmol/L (ref 135–145)

## 2018-06-12 MED ORDER — SODIUM CHLORIDE 0.9 % IV SOLN
INTRAVENOUS | Status: AC
  Administered 2018-06-12: 20:00:00 via INTRAVENOUS

## 2018-06-12 NOTE — Telephone Encounter (Signed)
Please contact the patient and see what he would like to discuss. I am aware of what is going on with his current hospitalization.

## 2018-06-12 NOTE — Telephone Encounter (Signed)
I spoke with the pt's son and he states to call him after 4 pm.  Hilja Kintzel,cma

## 2018-06-12 NOTE — Telephone Encounter (Signed)
Noted.  Please see the initial message.  The patient is currently hospitalized and wanted to speak with me.  Please contact him and determine what he wants to speak about.  I am aware that he is hospitalized and I am aware of the reasons that he is there.

## 2018-06-12 NOTE — Care Management Important Message (Signed)
Important Message  Patient Details  Name: Justin Andrews MRN: [id number] Date of Birth: April 12, 1934   Medicare Important Message Given:  Yes    Annamaria Boots, Latanya Presser 06/12/2018, 3:58 PM

## 2018-06-12 NOTE — Progress Notes (Signed)
Patient ID: Justin Andrews, male   DOB: 1934/01/17, 84 y.o.   MRN: [id number]  [facility] PROGRESS NOTE  Justin Andrews MBT:[id number] DOB: August 07, 1934 DOA: 05/28/2018 PCP: Leone Haven, MD  HPI/Subjective: Patient is doing fine, he was deemed incompetent by the psychiatrist 06/04/2018. Awaiting guardianship by DSS  Objective: Vitals:   06/12/18 0537 06/12/18 1031  BP: 139/65 130/75  Pulse: 92 (!) 102  Resp:  18  Temp: 98 F (36.7 C) 98 F (36.7 C)  SpO2: 97% 99%   No intake or output data in the 24 hours ending 06/12/18 1642 Filed Weights   05/28/18 1638 05/29/18 0615  Weight: 49.9 kg 54.9 kg    ROS: Review of Systems  Constitutional: Negative for chills and fever.  Eyes: Negative for blurred vision.  Respiratory: Negative for cough and shortness of breath.   Cardiovascular: Negative for chest pain.  Gastrointestinal: Negative for abdominal pain, constipation, diarrhea, nausea and vomiting.  Genitourinary: Negative for dysuria.  Musculoskeletal: Negative for joint pain.  Neurological: Negative for dizziness and headaches.   Exam: Physical Exam  Constitutional: He is oriented to person, place, and time.  HENT:  Nose: No mucosal edema.  Mouth/Throat: No oropharyngeal exudate or posterior oropharyngeal edema.  Eyes: Pupils are equal, round, and reactive to light. Conjunctivae, EOM and lids are normal.  Neck: No JVD present. Carotid bruit is not present. No edema present. No thyroid mass and no thyromegaly present.  Cardiovascular: S1 normal and S2 normal. Exam reveals no gallop.  No murmur heard. Pulses:      Dorsalis pedis pulses are 2+ on the right side and 2+ on the left side.  Respiratory: No respiratory distress. He has no wheezes. He has no rhonchi. He has no rales.  GI: Soft. Bowel sounds are normal. There is no abdominal tenderness.  Small spot on the right abdomen where prior port site was for the surgery has a little inflammation there.  Musculoskeletal:      Right ankle: He exhibits no swelling.     Left ankle: He exhibits no swelling.  Lymphadenopathy:    He has no cervical adenopathy.  Neurological: He is alert and oriented to person, place, and time. No cranial nerve deficit.  Skin: Skin is warm. No rash noted. Nails show no clubbing.  Psychiatric: He has a normal mood and affect.      Data Reviewed: Basic Metabolic Panel: Recent Labs  Lab 06/08/18 0348 06/09/18 0446 06/12/18 0618  NA 138 138 140  K 4.2 3.9 4.0  CL 105 107 108  CO2 27 26 24   GLUCOSE 98 82 75  BUN 30* 28* 25*  CREATININE 2.18* 2.05* 2.14*  CALCIUM 9.1 8.4* 8.7*   Liver Function Tests: No results for input(s): AST, ALT, ALKPHOS, BILITOT, PROT, ALBUMIN in the last 168 hours. No results for input(s): LIPASE, AMYLASE in the last 168 hours. CBC: Recent Labs  Lab 06/08/18 0348  WBC 10.2  HGB 8.6*  HCT 26.7*  MCV 90.2  PLT 253   Cardiac Enzymes: No results for input(s): CKTOTAL, CKMB, CKMBINDEX, TROPONINI in the last 168 hours. BNP (last 3 results) Recent Labs    11/19/17 0937  BNP 457.0*      No results found for this or any previous visit (from the past 240 hour(s)).    Scheduled Meds: . calcium-vitamin D  1 tablet Oral BID  . enoxaparin (LOVENOX) injection  30 mg Subcutaneous Q24H  . folic acid  1 mg Oral Daily  .  mirtazapine  15 mg Oral QHS  . multivitamin with minerals  1 tablet Oral Daily  . pantoprazole  40 mg Oral BID  . Ensure Max Protein  11 oz Oral BID BM  . sodium chloride flush  3 mL Intravenous Q12H  . tamsulosin  0.4 mg Oral Daily  . thiamine  100 mg Oral Daily   Or  . thiamine  100 mg Intravenous Daily   Continuous Infusions:   Assessment/Plan:  1. C. difficile colitis.  Patient with improved abdominal pain , continue current pain management as needed .  Diarrhea has improved.  On p.o. vancomycin.  Will discontinue p.o. vancomycin after 14-day of course enteric precautions 2. AKI on Chronic kidney disease stage  III.  Monitor renal function while here in the hospital. Base line criteria 1.8 and is at 2.14, gentle hydration with IVF provided 3. Recent surgery for bleeding duodenal ulcer.  JP drain removed.  Feeding tube not being used.  Seen by surgery.  Continue PPI. 4. History of alcohol abuse-ciwa 5. History of hypertension.  Blood pressure on the lower side.  Holding all antihypertensive meds.  Continue close monitoring of the blood pressure. 6. Patient lacks capacity to make good medical decisions.  I do not have a good disposition at this point.  Son will not take him to his house at this time.  Awaiting for guardianship from DSS follow-up with social worker, probably not going to happen  until July    Code Status:     Code Status Orders  (From admission, onward)         Start     Ordered   05/29/18 0016  Do not attempt resuscitation (DNR)  Continuous    Question Answer Comment  In the event of cardiac or respiratory ARREST Do not call a "code blue"   In the event of cardiac or respiratory ARREST Do not perform Intubation, CPR, defibrillation or ACLS   In the event of cardiac or respiratory ARREST Use medication by any route, position, wound care, and other measures to relive pain and suffering. May use oxygen, suction and manual treatment of airway obstruction as needed for comfort.   Comments per patient's only child the patient is to be DNR per his father's prior expressed wishes. Still ok to receive blood, antibiotics, and other care but NO intubation and NO CPR      05/29/18 0015        Code Status History    Date Active Date Inactive Code Status Order ID Comments User Context   05/29/2018 0016 05/29/2018 0016 DNR [id number]  Mayer Camel, NP ED   05/02/2018 1427 05/26/2018 2140 DNR [id number]  Vaughan Basta, MD Inpatient   05/02/2018 0804 05/02/2018 1427 DNR [id number]  Delman Kitten, MD ED   03/13/2018 1929 03/14/2018 2030 Full Code [id number]  Nena Polio, MD ED   11/30/2017  1846 12/01/2017 2040 Full Code [id number]  Thornton Park, MD Inpatient   11/30/2017 1601 11/30/2017 1846 Full Code [id number]  Demetrios Loll, MD Inpatient     Family Communication: Son and other family medicine not involved in his care Disposition Plan: As per psychiatry, patient lacks the capacity to make decisions for himself at this point.  I gave this message to the care manager team and social work team.  They will look into other options.  Time spent: 28 minutes  [place]

## 2018-06-13 NOTE — Progress Notes (Signed)
Patient ID: Justin Andrews, male   DOB: [birth year], 84 y.o.   MRN: [id number]  [facility] PROGRESS NOTE  ADAN BAEHR PZW:[id number] DOB: Jul 27, 1934 DOA: 05/28/2018 PCP: Leone Haven, MD  HPI/Subjective: Feels fine and offers no complaints.  He states that his son has found a group home in [place].  He states he needs to get out of here today.  States he is going crazy here.  Son could not give me the name of a group home in [place].  Objective: Vitals:   06/13/18 0503 06/13/18 0908  BP: 134/67 140/70  Pulse: 85 (!) 101  Resp:  18  Temp: 98.5 F (36.9 C) 97.7 F (36.5 C)  SpO2: 97% 98%    Intake/Output Summary (Last 24 hours) at 06/13/2018 1505 Last data filed at 06/13/2018 0911 Gross per 24 hour  Intake 807.35 ml  Output -  Net 807.35 ml   Filed Weights   05/28/18 1638 05/29/18 0615  Weight: 49.9 kg 54.9 kg    ROS: Review of Systems  Constitutional: Negative for chills and fever.  Eyes: Negative for blurred vision.  Respiratory: Negative for cough and shortness of breath.   Cardiovascular: Negative for chest pain.  Gastrointestinal: Negative for abdominal pain, constipation, diarrhea, nausea and vomiting.  Genitourinary: Negative for dysuria.  Musculoskeletal: Negative for joint pain.  Neurological: Negative for dizziness and headaches.   Exam: Physical Exam  HENT:  Nose: No mucosal edema.  Mouth/Throat: No oropharyngeal exudate or posterior oropharyngeal edema.  Eyes: Pupils are equal, round, and reactive to light. Conjunctivae, EOM and lids are normal.  Neck: No JVD present. Carotid bruit is not present. No edema present. No thyroid mass and no thyromegaly present.  Cardiovascular: S1 normal and S2 normal. Exam reveals no gallop.  No murmur heard. Pulses:      Dorsalis pedis pulses are 2+ on the right side and 2+ on the left side.  Respiratory: No respiratory distress. He has no wheezes. He has no rhonchi. He has no rales.  GI: Soft. Bowel sounds  are normal. There is no abdominal tenderness.  Point tenderness over the 1 of the port sites from previous surgery.  Musculoskeletal:     Right shoulder: He exhibits no swelling.  Lymphadenopathy:    He has no cervical adenopathy.  Neurological: He is alert. No cranial nerve deficit.  Skin: Skin is warm. No rash noted. Nails show no clubbing.  Psychiatric: He has a normal mood and affect.      Data Reviewed: Basic Metabolic Panel: Recent Labs  Lab 06/08/18 0348 06/09/18 0446 06/12/18 0618  NA 138 138 140  K 4.2 3.9 4.0  CL 105 107 108  CO2 27 26 24   GLUCOSE 98 82 75  BUN 30* 28* 25*  CREATININE 2.18* 2.05* 2.14*  CALCIUM 9.1 8.4* 8.7*   CBC: Recent Labs  Lab 06/08/18 0348  WBC 10.2  HGB 8.6*  HCT 26.7*  MCV 90.2  PLT 253   BNP (last 3 results) Recent Labs    11/19/17 0937  BNP 457.0*     Scheduled Meds: . calcium-vitamin D  1 tablet Oral BID  . enoxaparin (LOVENOX) injection  30 mg Subcutaneous Q24H  . folic acid  1 mg Oral Daily  . mirtazapine  15 mg Oral QHS  . multivitamin with minerals  1 tablet Oral Daily  . pantoprazole  40 mg Oral BID  . Ensure Max Protein  11 oz Oral BID BM  . sodium chloride flush  3 mL Intravenous Q12H  . tamsulosin  0.4 mg Oral Daily  . thiamine  100 mg Oral Daily   Or  . thiamine  100 mg Intravenous Daily   Continuous Infusions:  Assessment/Plan:  1. C. difficile colitis.  Finished course of p.o. vancomycin. 2. Chronic kidney disease stage IV.  Creatinine up a little bit from previous.  Continue to monitor. 3. Anemia of chronic disease 4. Recent surgery for bleeding duodenal ulcer.  JP drain removed.  Feeding tube not being used.  Continue PPI 5. History of alcohol abuse 6. History of hypertension.  Since blood pressure on the lower side antihypertensives were discontinued 7. BPH on Flomax 8. Seen by psychiatry and patient lacks the decision-making capability at this point.  Awaiting guardianship.  Code Status:      Code Status Orders  (From admission, onward)         Start     Ordered   05/29/18 0016  Do not attempt resuscitation (DNR)  Continuous    Question Answer Comment  In the event of cardiac or respiratory ARREST Do not call a "code blue"   In the event of cardiac or respiratory ARREST Do not perform Intubation, CPR, defibrillation or ACLS   In the event of cardiac or respiratory ARREST Use medication by any route, position, wound care, and other measures to relive pain and suffering. May use oxygen, suction and manual treatment of airway obstruction as needed for comfort.   Comments per patient's only child the patient is to be DNR per his father's prior expressed wishes. Still ok to receive blood, antibiotics, and other care but NO intubation and NO CPR      05/29/18 0015        Code Status History    Date Active Date Inactive Code Status Order ID Comments User Context   05/29/2018 0016 05/29/2018 0016 DNR [id number]  Mayer Camel, NP ED   05/02/2018 1427 05/26/2018 2140 DNR [id number]  Vaughan Basta, MD Inpatient   05/02/2018 0804 05/02/2018 1427 DNR [id number]  Delman Kitten, MD ED   03/13/2018 1929 03/14/2018 2030 Full Code [id number]  Nena Polio, MD ED   11/30/2017 1846 12/01/2017 2040 Full Code [id number]  Thornton Park, MD Inpatient   11/30/2017 1601 11/30/2017 1846 Full Code [id number]  Demetrios Loll, MD Inpatient     Family Communication: Spoke with son on the phone Disposition Plan: Unknown at this point  Time spent: 25 minutes  [place]

## 2018-06-14 NOTE — Progress Notes (Signed)
Patient ID: Justin Andrews, male   DOB: [birth year], 84 y.o.   MRN: [id number]   [facility] PROGRESS NOTE  Justin Andrews YQI:[id number] DOB: December 20, 1934 DOA: 05/28/2018 PCP: Justin Haven, MD  HPI/Subjective: The patient wants to get out of the hospital.  Offers no complaints.  Objective: Vitals:   06/14/18 0549 06/14/18 1002  BP: 124/61 134/66  Pulse: 88 89  Resp: 19 17  Temp: 98.2 F (36.8 C) 98.2 F (36.8 C)  SpO2: 96% 97%   No intake or output data in the 24 hours ending 06/14/18 1237 Filed Weights   05/28/18 1638 05/29/18 0615  Weight: 49.9 kg 54.9 kg    ROS: Review of Systems  Constitutional: Negative for chills and fever.  Eyes: Negative for blurred vision.  Respiratory: Negative for cough and shortness of breath.   Cardiovascular: Negative for chest pain.  Gastrointestinal: Negative for abdominal pain, constipation, diarrhea, nausea and vomiting.  Genitourinary: Negative for dysuria.  Musculoskeletal: Negative for joint pain.  Neurological: Negative for dizziness and headaches.   Exam: Physical Exam  HENT:  Nose: No mucosal edema.  Mouth/Throat: No oropharyngeal exudate or posterior oropharyngeal edema.  Eyes: Pupils are equal, round, and reactive to light. Conjunctivae, EOM and lids are normal.  Neck: No JVD present. Carotid bruit is not present. No edema present. No thyroid mass and no thyromegaly present.  Cardiovascular: S1 normal and S2 normal. Exam reveals no gallop.  No murmur heard. Pulses:      Dorsalis pedis pulses are 2+ on the right side and 2+ on the left side.  Respiratory: No respiratory distress. He has no wheezes. He has no rhonchi. He has no rales.  GI: Soft. Bowel sounds are normal. There is no abdominal tenderness.  Point tenderness over the 1 of the port sites from previous surgery.  Musculoskeletal:     Right ankle: He exhibits no swelling.     Left ankle: He exhibits no swelling.  Lymphadenopathy:    He has no cervical  adenopathy.  Neurological: He is alert. No cranial nerve deficit.  Skin: Skin is warm. No rash noted. Nails show no clubbing.  Psychiatric: He has a normal mood and affect.      Data Reviewed: Basic Metabolic Panel: Recent Labs  Lab 06/08/18 0348 06/09/18 0446 06/12/18 0618  NA 138 138 140  K 4.2 3.9 4.0  CL 105 107 108  CO2 27 26 24   GLUCOSE 98 82 75  BUN 30* 28* 25*  CREATININE 2.18* 2.05* 2.14*  CALCIUM 9.1 8.4* 8.7*   CBC: Recent Labs  Lab 06/08/18 0348  WBC 10.2  HGB 8.6*  HCT 26.7*  MCV 90.2  PLT 253   BNP (last 3 results) Recent Labs    11/19/17 0937  BNP 457.0*     Scheduled Meds: . calcium-vitamin D  1 tablet Oral BID  . enoxaparin (LOVENOX) injection  30 mg Subcutaneous Q24H  . folic acid  1 mg Oral Daily  . mirtazapine  15 mg Oral QHS  . multivitamin with minerals  1 tablet Oral Daily  . pantoprazole  40 mg Oral BID  . Ensure Max Protein  11 oz Oral BID BM  . sodium chloride flush  3 mL Intravenous Q12H  . tamsulosin  0.4 mg Oral Daily  . thiamine  100 mg Oral Daily   Or  . thiamine  100 mg Intravenous Daily   Continuous Infusions:  Assessment/Plan:  1. C. difficile colitis.  Finished course of p.o.  vancomycin. 2. Chronic kidney disease stage IV.  Creatinine up a little bit from previous.  Continue to monitor. 3. Anemia of chronic disease 4. Recent surgery for bleeding duodenal ulcer.  JP drain removed.  Feeding tube not being used.  Continue PPI 5. History of alcohol abuse 6. History of hypertension.  Since blood pressure on the lower side antihypertensives were discontinued 7. BPH on Flomax 8. Seen by psychiatry and patient lacks the decision-making capability at this point.  Awaiting guardianship.  Code Status:     Code Status Orders  (From admission, onward)         Start     Ordered   05/29/18 0016  Do not attempt resuscitation (DNR)  Continuous    Question Answer Comment  In the event of cardiac or respiratory ARREST Do  not call a "code blue"   In the event of cardiac or respiratory ARREST Do not perform Intubation, CPR, defibrillation or ACLS   In the event of cardiac or respiratory ARREST Use medication by any route, position, wound care, and other measures to relive pain and suffering. May use oxygen, suction and manual treatment of airway obstruction as needed for comfort.   Comments per patient's only child the patient is to be DNR per his father's prior expressed wishes. Still ok to receive blood, antibiotics, and other care but NO intubation and NO CPR      05/29/18 0015        Code Status History    Date Active Date Inactive Code Status Order ID Comments User Context   05/29/2018 0016 05/29/2018 0016 DNR [id number]  Justin Camel, NP ED   05/02/2018 1427 05/26/2018 2140 DNR [id number]  Justin Basta, MD Inpatient   05/02/2018 0804 05/02/2018 1427 DNR [id number]  Justin Kitten, MD ED   03/13/2018 1929 03/14/2018 2030 Full Code [id number]  Justin Polio, MD ED   11/30/2017 1846 12/01/2017 2040 Full Code [id number]  Justin Park, MD Inpatient   11/30/2017 1601 11/30/2017 1846 Full Code [id number]  Justin Loll, MD Inpatient     Family Communication: Spoke with son on the phone yesterday Disposition Plan: Son states there could be a possibility of a group home in [place].  Social worker will need to look into this  Time spent: 25 minutes  Justin Andrews

## 2018-06-15 ENCOUNTER — Ambulatory Visit: Payer: Medicare HMO | Admitting: Family Medicine

## 2018-06-15 LAB — BASIC METABOLIC PANEL
Anion gap: 8 (ref 5–15)
BUN: 22 mg/dL (ref 8–23)
CO2: 22 mmol/L (ref 22–32)
Calcium: 8.6 mg/dL — ABNORMAL LOW (ref 8.9–10.3)
Chloride: 108 mmol/L (ref 98–111)
Creatinine, Ser: 2.17 mg/dL — ABNORMAL HIGH (ref 0.61–1.24)
GFR calc Af Amer: 32 mL/min — ABNORMAL LOW (ref >60)
GFR calc non Af Amer: 27 mL/min — ABNORMAL LOW (ref >60)
Glucose, Bld: 69 mg/dL — ABNORMAL LOW (ref 70–99)
Potassium: 3.6 mmol/L (ref 3.5–5.1)
Sodium: 138 mmol/L (ref 135–145)

## 2018-06-15 LAB — HEMOGLOBIN: Hemoglobin: 7.8 g/dL — ABNORMAL LOW (ref 13.0–17.0)

## 2018-06-15 MED ORDER — SODIUM CHLORIDE 0.9 % IV SOLN
400.0000 mg | Freq: Once | INTRAVENOUS | Status: AC
  Administered 2018-06-15: 400 mg via INTRAVENOUS
  Filled 2018-06-15: qty 20

## 2018-06-15 MED ORDER — FERROUS SULFATE 325 (65 FE) MG PO TABS
325.0000 mg | ORAL_TABLET | Freq: Every day | ORAL | Status: DC
  Administered 2018-06-15 – 2018-06-17 (×3): 325 mg via ORAL
  Filled 2018-06-15 (×3): qty 1

## 2018-06-15 NOTE — Care Management Important Message (Signed)
Important Message  Patient Details  Name: CABELL LAZENBY MRN: [id number] Date of Birth: 01-Feb-1934   Medicare Important Message Given:  Yes    Annamaria Boots, Latanya Presser 06/15/2018, 3:40 PM

## 2018-06-15 NOTE — TOC Progression Note (Signed)
Transition of Care [facility]) - Progression Note    Patient Details  Name: Justin Andrews MRN: [id number] Date of Birth: 08-14-1934  Transition of Care [facility]) CM/SW [place], [place] Phone Number: 06/15/2018, 3:40 PM  Clinical Narrative: CSW spoke with Jazueline at [facility]. [facility] states that they have secured a group home for patient. Per Jeanice Lim patient has been accepted at [facility], [address] [place], [place] [address] [phone] Fax: [phone]. Per Zackery Barefoot they expect to admit patient on Wednesday and son is aware and in agreement. CSW notified MD of above. CSW will continue to follow for discharge planning.       Expected Discharge Plan: [facility] Home([facility]) Barriers to Discharge: Continued Medical Work up  Expected Discharge Plan and Services Expected Discharge Plan: Group Home([facility] [place] [facility])   Discharge Planning Services: CM Consult   Living arrangements for the past 2 months: Group Home                                       Social Determinants of Health (SDOH) Interventions    Readmission Risk Interventions Readmission Risk Prevention Plan 05/07/2018  Transportation Screening Complete  PCP or Specialist Appt within 3-5 Days Complete  HRI or Home Care Consult Complete  Medication Review (RN Care Manager) Complete  Some recent data might be hidden

## 2018-06-15 NOTE — Telephone Encounter (Signed)
I called and spoke with the patient's son.  He reports he is quite concerned with his dad being discharged to a group home from the hospital later this week.  He is unsure about the conditions and the neighborhood that the group home is in.  He does not think his dad will do well there.  He notes he has not been able to speak with the social worker recently to relay his concerns.  I advised that I would send the social worker a message so that she could touch base with him.  The son noted that he was unsure about his dad's mental state as well.  He notes his father has mentioned some vague suicidal thoughts to him on occasion while the patient has been hospitalized.  He is unable to tell me when this last occurred.  The patient's son is not sure if this is his dad just trying to get attention or not.  I discussed that the patient did have a psychiatric evaluation for this while hospitalized and was placed on medication for depression though it would be a good idea for the patient's son to relay his concerns to the physician that is caring for the patient in the hospital.  The patients son will contact the hospital to discuss his concerns with the physician.  I also noted that I would send the attending a message outlining the son's concerns as well.  I advised that the patient's son should contact us with any other concerns.

## 2018-06-15 NOTE — Progress Notes (Signed)
Patient ID: Justin Andrews, male   DOB: 03-Mar-1934, 84 y.o.   MRN: [id number]   [employer] PROGRESS NOTE  Justin Andrews PNT:[id number] DOB: [birth year] DOA: 05/28/2018 PCP: Leone Haven, MD  HPI/Subjective: The patient states he feels fine.  He states if he does not get out of the hospital soon he is going to go crazy.  Offers no complaints and physically feels okay.  Objective: Vitals:   06/15/18 0617 06/15/18 0836  BP: 133/64 (!) 142/76  Pulse: 80 (!) 103  Resp: 16 20  Temp: 97.9 F (36.6 C) 97.9 F (36.6 C)  SpO2: 95% 97%    Intake/Output Summary (Last 24 hours) at 06/15/2018 1346 Last data filed at 06/14/2018 2100 Gross per 24 hour  Intake 140 ml  Output -  Net 140 ml   Filed Weights   05/28/18 1638 05/29/18 0615  Weight: 49.9 kg 54.9 kg    ROS: Review of Systems  Constitutional: Negative for chills and fever.  Eyes: Negative for blurred vision.  Respiratory: Negative for cough and shortness of breath.   Cardiovascular: Negative for chest pain.  Gastrointestinal: Negative for abdominal pain, constipation, diarrhea, nausea and vomiting.  Genitourinary: Negative for dysuria.  Musculoskeletal: Negative for joint pain.  Neurological: Negative for dizziness and headaches.   Exam: Physical Exam  HENT:  Nose: No mucosal edema.  Mouth/Throat: No oropharyngeal exudate or posterior oropharyngeal edema.  Eyes: Pupils are equal, round, and reactive to light. Conjunctivae, EOM and lids are normal.  Neck: No JVD present. Carotid bruit is not present. No edema present. No thyroid mass and no thyromegaly present.  Cardiovascular: S1 normal and S2 normal. Exam reveals no gallop.  No murmur heard. Pulses:      Dorsalis pedis pulses are 2+ on the right side and 2+ on the left side.  Respiratory: No respiratory distress. He has no wheezes. He has no rhonchi. He has no rales.  GI: Soft. Bowel sounds are normal. There is no abdominal tenderness.  Point tenderness over the 1  of the port sites from previous surgery.  Musculoskeletal:     Right ankle: He exhibits no swelling.     Left ankle: He exhibits no swelling.  Lymphadenopathy:    He has no cervical adenopathy.  Neurological: He is alert. No cranial nerve deficit.  Skin: Skin is warm. No rash noted. Nails show no clubbing.  Psychiatric: He has a normal mood and affect.      Data Reviewed: Basic Metabolic Panel: Recent Labs  Lab 06/09/18 0446 06/12/18 0618 06/15/18 0429  NA 138 140 138  K 3.9 4.0 3.6  CL 107 108 108  CO2 26 24 22   GLUCOSE 82 75 69*  BUN 28* 25* 22  CREATININE 2.05* 2.14* 2.17*  CALCIUM 8.4* 8.7* 8.6*   CBC: Recent Labs  Lab 06/15/18 0429  HGB 7.8*   BNP (last 3 results) Recent Labs    11/19/17 0937  BNP 457.0*     Scheduled Meds: . calcium-vitamin D  1 tablet Oral BID  . enoxaparin (LOVENOX) injection  30 mg Subcutaneous Q24H  . ferrous sulfate  325 mg Oral Q breakfast  . folic acid  1 mg Oral Daily  . mirtazapine  15 mg Oral QHS  . multivitamin with minerals  1 tablet Oral Daily  . pantoprazole  40 mg Oral BID  . Ensure Max Protein  11 oz Oral BID BM  . sodium chloride flush  3 mL Intravenous Q12H  . tamsulosin  0.4 mg Oral Daily  . thiamine  100 mg Oral Daily   Or  . thiamine  100 mg Intravenous Daily   Continuous Infusions: . iron sucrose      Assessment/Plan:  1. C. difficile colitis.  Finished course of p.o. vancomycin. 2. Chronic kidney disease stage IV.  Creatinine up a little bit from previous.  Continue to monitor. 3. Anemia.  We will give a dose of IV Venofer today.  Start low-dose oral iron. 4. Recent surgery for bleeding duodenal ulcer.  JP drain removed.  Feeding tube not being used.  Continue PPI 5. History of alcohol abuse 6. History of hypertension.  Since blood pressure on the lower side antihypertensives were discontinued 7. BPH on Flomax 8. Seen by psychiatry and patient lacks the decision-making capability at this point.   Awaiting guardianship.  Code Status:     Code Status Orders  (From admission, onward)         Start     Ordered   05/29/18 0016  Do not attempt resuscitation (DNR)  Continuous    Question Answer Comment  In the event of cardiac or respiratory ARREST Do not call a "code blue"   In the event of cardiac or respiratory ARREST Do not perform Intubation, CPR, defibrillation or ACLS   In the event of cardiac or respiratory ARREST Use medication by any route, position, wound care, and other measures to relive pain and suffering. May use oxygen, suction and manual treatment of airway obstruction as needed for comfort.   Comments per patient's only child the patient is to be DNR per his father's prior expressed wishes. Still ok to receive blood, antibiotics, and other care but NO intubation and NO CPR      05/29/18 0015        Code Status History    Date Active Date Inactive Code Status Order ID Comments User Context   05/29/2018 0016 05/29/2018 0016 DNR [id number]  Mayer Camel, NP ED   05/02/2018 1427 05/26/2018 2140 DNR [id number]  Vaughan Basta, MD Inpatient   05/02/2018 0804 05/02/2018 1427 DNR [id number]  Delman Kitten, MD ED   03/13/2018 1929 03/14/2018 2030 Full Code [id number]  Nena Polio, MD ED   11/30/2017 1846 12/01/2017 2040 Full Code [id number]  Thornton Park, MD Inpatient   11/30/2017 1601 11/30/2017 1846 Full Code [id number]  Demetrios Loll, MD Inpatient     Family Communication: Left message for son on the phone Disposition Plan: Son states there could be a possibility of a group home in [place].  I tried to reach the patient's son about this.  I spoke with [facility] work team and she will try to reach the son about the name of this group home so we can look into this.  Time spent: 24 minutes  [place]

## 2018-06-16 MED ORDER — MIRTAZAPINE 15 MG PO TABS
30.0000 mg | ORAL_TABLET | Freq: Every day | ORAL | Status: DC
  Administered 2018-06-16: 22:00:00 30 mg via ORAL
  Filled 2018-06-16: qty 2

## 2018-06-16 NOTE — Progress Notes (Signed)
Patient ID: Justin Andrews, male   DOB: 24-Apr-1934, 84 y.o.   MRN: [id number]   [employer] [facility] PROGRESS NOTE  AZARIAH BONURA LKG:[id number] DOB: 1934/09/02 DOA: 05/28/2018 PCP: Leone Haven, MD  HPI/Subjective: The patient physically feels good.  Offers no complaints.  Patient walking around the hallway and actually spoke to him a couple times in the hallway and the visit in the room.  Objective: Vitals:   06/15/18 2025 06/16/18 0436  BP: (!) 141/75 137/73  Pulse: 87 85  Resp: 15 17  Temp: 98.5 F (36.9 C) 98.3 F (36.8 C)  SpO2: 96% 93%    Filed Weights   05/28/18 1638 05/29/18 0615  Weight: 49.9 kg 54.9 kg    ROS: Review of Systems  Constitutional: Negative for chills and fever.  Eyes: Negative for blurred vision.  Respiratory: Negative for cough and shortness of breath.   Cardiovascular: Negative for chest pain.  Gastrointestinal: Negative for abdominal pain, constipation, diarrhea, nausea and vomiting.  Genitourinary: Negative for dysuria.  Musculoskeletal: Negative for joint pain.  Neurological: Negative for dizziness and headaches.   Exam: Physical Exam  HENT:  Nose: No mucosal edema.  Mouth/Throat: No oropharyngeal exudate or posterior oropharyngeal edema.  Eyes: Pupils are equal, round, and reactive to light. Conjunctivae, EOM and lids are normal.  Neck: No JVD present. Carotid bruit is not present. No edema present. No thyroid mass and no thyromegaly present.  Cardiovascular: S1 normal and S2 normal. Exam reveals no gallop.  No murmur heard. Pulses:      Dorsalis pedis pulses are 2+ on the right side and 2+ on the left side.  Respiratory: No respiratory distress. He has no wheezes. He has no rhonchi. He has no rales.  GI: Soft. Bowel sounds are normal. There is no abdominal tenderness.  Point tenderness over the 1 of the port sites from previous surgery.  Musculoskeletal:     Right ankle: He exhibits no swelling.     Left ankle: He exhibits no swelling.   Lymphadenopathy:    He has no cervical adenopathy.  Neurological: He is alert. No cranial nerve deficit.  Skin: Skin is warm. No rash noted. Nails show no clubbing.  Psychiatric: He has a normal mood and affect.      Data Reviewed: Basic Metabolic Panel: Recent Labs  Lab 06/12/18 0618 06/15/18 0429  NA 140 138  K 4.0 3.6  CL 108 108  CO2 24 22  GLUCOSE 75 69*  BUN 25* 22  CREATININE 2.14* 2.17*  CALCIUM 8.7* 8.6*   CBC: Recent Labs  Lab 06/15/18 0429  HGB 7.8*   BNP (last 3 results) Recent Labs    11/19/17 0937  BNP 457.0*     Scheduled Meds: . calcium-vitamin D  1 tablet Oral BID  . enoxaparin (LOVENOX) injection  30 mg Subcutaneous Q24H  . ferrous sulfate  325 mg Oral Q breakfast  . folic acid  1 mg Oral Daily  . mirtazapine  30 mg Oral QHS  . multivitamin with minerals  1 tablet Oral Daily  . pantoprazole  40 mg Oral BID  . Ensure Max Protein  11 oz Oral BID BM  . sodium chloride flush  3 mL Intravenous Q12H  . tamsulosin  0.4 mg Oral Daily  . thiamine  100 mg Oral Daily   Or  . thiamine  100 mg Intravenous Daily   Continuous Infusions:   Assessment/Plan:  1. C. difficile colitis.  Finished course of p.o. vancomycin. 2.  Chronic kidney disease stage IV.  Creatinine up a little bit from previous.  Continue to monitor as outpatient 3. Anemia.  Given IV iron and started on oral iron 4. Recent surgery for bleeding duodenal ulcer.  JP drain removed.  Feeding tube not being used.  Continue PPI.  Follow-up with general surgery as outpatient 5. History of alcohol abuse 6. History of hypertension.  Since blood pressure on the lower side antihypertensives were discontinued 7. BPH on Flomax 8. Seen by psychiatry and patient lacks the decision-making capability at this point.  Hopefully will be able to send out to group home tomorrow.  Code Status:     Code Status Orders  (From admission, onward)         Start     Ordered   05/29/18 0016  Do not  attempt resuscitation (DNR)  Continuous    Question Answer Comment  In the event of cardiac or respiratory ARREST Do not call a "code blue"   In the event of cardiac or respiratory ARREST Do not perform Intubation, CPR, defibrillation or ACLS   In the event of cardiac or respiratory ARREST Use medication by any route, position, wound care, and other measures to relive pain and suffering. May use oxygen, suction and manual treatment of airway obstruction as needed for comfort.   Comments per patient's only child the patient is to be DNR per his father's prior expressed wishes. Still ok to receive blood, antibiotics, and other care but NO intubation and NO CPR      05/29/18 0015        Code Status History    Date Active Date Inactive Code Status Order ID Comments User Context   05/29/2018 0016 05/29/2018 0016 DNR [id number]  Mayer Camel, NP ED   05/02/2018 1427 05/26/2018 2140 DNR [id number]  Vaughan Basta, MD Inpatient   05/02/2018 0804 05/02/2018 1427 DNR [id number]  Delman Kitten, MD ED   03/13/2018 1929 03/14/2018 2030 Full Code [id number]  Nena Polio, MD ED   11/30/2017 1846 12/01/2017 2040 Full Code [id number]  Thornton Park, MD Inpatient   11/30/2017 1601 11/30/2017 1846 Full Code [id number]  Demetrios Loll, MD Inpatient     Family Communication: With son on the phone Disposition Plan: Hopefully will be able to go out to group home tomorrow  Time spent: 27 minutes  [place]

## 2018-06-16 NOTE — Progress Notes (Signed)
   06/16/18 1100  Clinical Encounter Type  Visited With Patient  Visit Type Follow-up  Referral From Nurse  Ch helped taking the pt outside the building for him to get some fresh air. Pt said that he gets lonely in the hospital. Ch asked about his relationship with his son and grandson, to which pt said, "they work and I don't bother them." Ch will follow up with the pt this afternoon to give him a little break from the hospital environment.

## 2018-06-16 NOTE — TOC Progression Note (Signed)
Transition of Care [facility]) - Progression Note    Patient Details  Name: Justin Andrews MRN: [id number] Date of Birth: Apr 07, 1934  Transition of Care [facility]) CM/SW [place], [place] Phone Number: 06/16/2018, 2:46 PM  Clinical Narrative:   CSW received a message from Dr. Caryl Bis regarding patient. Dr. Caryl Bis states that patient's son would like a call from [place]. CSW contacted patient's son [name]. [name] states that he wanted to make sure that we are all aware that patient has a new group home to go to. CSW explained that per Jeanice Lim at [facility] patient will go to [facility] on Wednesday. Son states that is his understanding as well. Son has no other concerns or questions. CSW will continue to follow for discharge planning.     Expected Discharge Plan: [facility] Home([facility]) Barriers to Discharge: Continued Medical Work up  Expected Discharge Plan and Services Expected Discharge Plan: Group Home([facility] [place] [facility])   Discharge Planning Services: CM Consult   Living arrangements for the past 2 months: Group Home                                       Social Determinants of Health (SDOH) Interventions    Readmission Risk Interventions Readmission Risk Prevention Plan 05/07/2018  Transportation Screening Complete  PCP or Specialist Appt within 3-5 Days Complete  HRI or Home Care Consult Complete  Medication Review (RN Care Manager) Complete  Some recent data might be hidden

## 2018-06-17 ENCOUNTER — Inpatient Hospital Stay: Payer: Medicare HMO

## 2018-06-17 MED ORDER — FERROUS SULFATE 325 (65 FE) MG PO TABS: 325.0000 mg | ORAL_TABLET | Freq: Every day | ORAL | 0 refills | Status: DC

## 2018-06-17 MED ORDER — THIAMINE HCL 100 MG PO TABS: 100.0000 mg | ORAL_TABLET | Freq: Every day | ORAL | 0 refills | Status: DC

## 2018-06-17 MED ORDER — FOLIC ACID 1 MG PO TABS: 1.0000 mg | ORAL_TABLET | Freq: Every day | ORAL | 0 refills | Status: DC

## 2018-06-17 MED ORDER — PANTOPRAZOLE SODIUM 40 MG PO TBEC: 40.0000 mg | DELAYED_RELEASE_TABLET | Freq: Every day | ORAL | 0 refills | Status: DC

## 2018-06-17 MED ORDER — MIRTAZAPINE 30 MG PO TABS: 15.0000 mg | ORAL_TABLET | Freq: Every day | ORAL | 0 refills | Status: DC

## 2018-06-17 MED ORDER — CALCIUM CARBONATE-VITAMIN D 500-200 MG-UNIT PO TABS: 1.0000 | ORAL_TABLET | Freq: Two times a day (BID) | ORAL | 0 refills | Status: DC

## 2018-06-17 MED ORDER — TAMSULOSIN HCL 0.4 MG PO CAPS: 0.4000 mg | ORAL_CAPSULE | Freq: Every day | ORAL | 0 refills | Status: DC

## 2018-06-17 MED ORDER — TRAMADOL HCL 50 MG PO TABS: 50.0000 mg | ORAL_TABLET | Freq: Two times a day (BID) | ORAL | 0 refills | Status: DC | PRN

## 2018-06-17 MED ORDER — TRAZODONE HCL 50 MG PO TABS: 50.0000 mg | ORAL_TABLET | Freq: Every evening | ORAL | 0 refills | Status: DC | PRN

## 2018-06-17 MED ORDER — ALPRAZOLAM 0.25 MG PO TABS: 0.2500 mg | ORAL_TABLET | Freq: Two times a day (BID) | ORAL | 0 refills | Status: DC | PRN

## 2018-06-17 MED ORDER — ENSURE ENLIVE PO LIQD: 237.0000 mL | Freq: Two times a day (BID) | ORAL | 0 refills | Status: DC

## 2018-06-17 NOTE — Care Management Important Message (Signed)
Important Message  Patient Details  Name: Justin Andrews MRN: [id number] Date of Birth: [birth year]   Medicare Important Message Given:  Yes    Annamaria Boots, [place] 06/17/2018, 11:01 AM

## 2018-06-17 NOTE — TOC Transition Note (Signed)
Transition of Care [facility]) - CM/SW Discharge Note   Patient Details  Name: Justin Andrews MRN: [id number] Date of Birth: [birth year]  Transition of Care [facility]) CM/SW Contact:  Annamaria Boots, [place] Phone Number: 06/17/2018, 10:42 AM   Clinical Narrative:   Patient has been accepted to [facility] and they can accept today. Patient is medically ready for discharge and will transfer to group home today. CSW spoke with Jerene Pitch at [facility] and she has spoken with group home and patient's son regarding discharge today. CSW notified patient of discharge today as well. Patient will be transported by group home director Madelynn Done. CSW completed discharge packet for group home.     Final next level of care: Group Home Barriers to Discharge: No Barriers Identified   Patient Goals and CMS Choice   CMS Medicare.gov Compare Post Acute Care list provided to:: Patient Represenative (must comment)(Son- [name])    Discharge Placement                    Patient and family notified of of transfer: 06/17/18  Discharge Plan and Services   Discharge Planning Services: CM Consult                                 Social Determinants of Health ([place]) Interventions     Readmission Risk Interventions Readmission Risk Prevention Plan 05/07/2018  Transportation Screening Complete  PCP or Specialist Appt within 3-5 Days Complete  HRI or Home Care Consult Complete  Medication Review (RN Care Manager) Complete  Some recent data might be hidden

## 2018-06-17 NOTE — Discharge Summary (Signed)
[facility] NAME: Justin Andrews    MR#:  [id number]  DATE OF BIRTH:  12-23-1934  DATE OF ADMISSION:  05/28/2018 ADMITTING PHYSICIAN: Christel Mormon, MD  DATE OF DISCHARGE: 06/17/2018  PRIMARY CARE PHYSICIAN: Leone Haven, MD    ADMISSION DIAGNOSIS:  Dehydration [E86.0] Generalized abdominal pain [R10.84] Diarrhea, unspecified type [R19.7] Abdominal pain [R10.9]  DISCHARGE DIAGNOSIS:  Principal Problem:   Abdominal pain Active Problems:   Altered mental status   SECONDARY DIAGNOSIS:   Past Medical History:  Diagnosis Date  . C. difficile diarrhea 11/2017  . Elevated serum creatinine   . History of melanoma   . Hypertension   . Osteoarthritis   . Tobacco abuse     HOSPITAL COURSE:   1.  C. difficile colitis.  Patient was treated with an entire course of oral vancomycin and has no further diarrhea. 2.  Chronic kidney disease stage IV.  Last creatinine 2.17.  Creatinine variable during the hospital course ranging between 1.44 and 2.18.  Follow-up with nephrology as outpatient. 3.  Anemia.  Patient had recent duodenal ulcer surgery.  I did give IV iron and started on oral iron.  Last hemoglobin 7.8.  Follow-up with blood counts as outpatient. 4.  Recent surgery for bleeding duodenal cell ulcer.  Follow-up with general surgery as outpatient.  JP drain removed.  Feeding tube not being used at this time can likely be removed with general surgery as outpatient.  Continue PPI. 5.  History of alcohol abuse.  Continue thiamine and folic acid as outpatient. 6.  History of hypertension.  Since blood pressure on the lower side.  Antihypertensive medications were discontinued. 7.  BPH on Flomax 8.  Patient was seen by psychiatry and the patient lacks the decision-making capacity at this point.  Patient started on Remeron for depression.  The patient's son does not want to take him into his house at this time because of his history of  abuse of substances.  Group home discharge today. 9.  Patient listed as a DO NOT RESUSCITATE  DISCHARGE CONDITIONS:   Satisfactory  CONSULTS OBTAINED:  Treatment Team:  Lavella Hammock, MD Esther Hardy, PhD  DRUG ALLERGIES:   Allergies  Allergen Reactions  . Aspirin Other (See Comments)    bleeding    DISCHARGE MEDICATIONS:   Allergies as of 06/17/2018      Reactions   Aspirin Other (See Comments)   bleeding      Medication List    STOP taking these medications   amLODipine 10 MG tablet Commonly known as:  NORVASC   carvedilol 6.25 MG tablet Commonly known as:  COREG     TAKE these medications   ALPRAZolam 0.25 MG tablet Commonly known as:  XANAX Take 1 tablet (0.25 mg total) by mouth 2 (two) times daily as needed for anxiety.   calcium-vitamin D 500-200 MG-UNIT tablet Commonly known as:  OSCAL WITH D Take 1 tablet by mouth 2 (two) times daily.   feeding supplement (ENSURE ENLIVE) Liqd Take 237 mLs by mouth 2 (two) times daily between meals.   ferrous sulfate 325 (65 FE) MG tablet Take 1 tablet (325 mg total) by mouth daily with breakfast.   folic acid 1 MG tablet Commonly known as:  FOLVITE Take 1 tablet (1 mg total) by mouth daily.   mirtazapine 30 MG tablet Commonly known as:  REMERON Take 0.5 tablets (15 mg total) by mouth at bedtime.  pantoprazole 40 MG tablet Commonly known as:  PROTONIX Take 1 tablet (40 mg total) by mouth daily. What changed:  when to take this   tamsulosin 0.4 MG Caps capsule Commonly known as:  FLOMAX Take 1 capsule (0.4 mg total) by mouth daily.   thiamine 100 MG tablet Take 1 tablet (100 mg total) by mouth daily.   traMADol 50 MG tablet Commonly known as:  ULTRAM Take 1 tablet (50 mg total) by mouth every 12 (twelve) hours as needed for severe pain. What changed:    when to take this  reasons to take this   traZODone 50 MG tablet Commonly known as:  DESYREL Take 1 tablet (50 mg total) by mouth at  bedtime as needed for up to 30 days for sleep.        DISCHARGE INSTRUCTIONS:   Follow-up PMD 5 days Follow-up general surgery as outpatient Follow-up nephrology as outpatient  If you experience worsening of your admission symptoms, develop shortness of breath, life threatening emergency, suicidal or homicidal thoughts you must seek medical attention immediately by calling 911 or calling your MD immediately  if symptoms less severe.  You Must read complete instructions/literature along with all the possible adverse reactions/side effects for all the Medicines you take and that have been prescribed to you. Take any new Medicines after you have completely understood and accept all the possible adverse reactions/side effects.   Please note  You were cared for by a hospitalist during your hospital stay. If you have any questions about your discharge medications or the care you received while you were in the hospital after you are discharged, you can call the unit and asked to speak with the hospitalist on call if the hospitalist that took care of you is not available. Once you are discharged, your primary care physician will handle any further medical issues. Please note that NO REFILLS for any discharge medications will be authorized once you are discharged, as it is imperative that you return to your primary care physician (or establish a relationship with a primary care physician if you do not have one) for your aftercare needs so that they can reassess your need for medications and monitor your lab values.    Today   CHIEF COMPLAINT:   Chief Complaint  Patient presents with  . Abdominal Pain  . Post-op Problem    HISTORY OF PRESENT ILLNESS:  Justin Andrews  is a 84 y.o. male came in with diarrhea after abdominal surgery and found to have C. difficile colitis   VITAL SIGNS:  Blood pressure 127/61, pulse 89, temperature 98.6 F (37 C), temperature source Oral, resp. rate 16, height 5'  8" (1.727 m), weight 54.9 kg, SpO2 96 %.   PHYSICAL EXAMINATION:  GENERAL:  84 y.o.-year-old patient lying in the bed with no acute distress.  EYES: Pupils equal, round, reactive to light and accommodation. No scleral icterus. Extraocular muscles intact.  HEENT: Head atraumatic, normocephalic. Oropharynx and nasopharynx clear.  NECK:  Supple, no jugular venous distention. No thyroid enlargement, no tenderness.  LUNGS: Normal breath sounds bilaterally, no wheezing, rales,rhonchi or crepitation. No use of accessory muscles of respiration.  CARDIOVASCULAR: S1, S2 normal. No murmurs, rubs, or gallops.  ABDOMEN: Soft, non-tender, non-distended. Bowel sounds present. No organomegaly or mass.  Feeding tube left abdomen. EXTREMITIES: No pedal edema, cyanosis, or clubbing.  NEUROLOGIC: Cranial nerves II through XII are intact. Muscle strength 5/5 in all extremities. Sensation intact. Gait not checked.  PSYCHIATRIC: The patient  is alert and answers questions.Marland Kitchen  SKIN: No obvious rash, lesion, or ulcer.   DATA REVIEW:   CBC Recent Labs  Lab 06/15/18 0429  HGB 7.8*    Chemistries  Recent Labs  Lab 06/15/18 0429  NA 138  K 3.6  CL 108  CO2 22  GLUCOSE 69*  BUN 22  CREATININE 2.17*  CALCIUM 8.6*     Microbiology Results  Results for orders placed or performed during the hospital encounter of 05/28/18  SARS Coronavirus 2 (CEPHEID - Performed in [place] hospital lab), Hosp Order     Status: None   Collection Time: 05/28/18  7:23 PM  Result Value Ref Range Status   SARS Coronavirus 2 NEGATIVE NEGATIVE Final    Comment: (NOTE) If result is NEGATIVE SARS-CoV-2 target nucleic acids are NOT DETECTED. The SARS-CoV-2 RNA is generally detectable in upper and lower  respiratory specimens during the acute phase of infection. The lowest  concentration of SARS-CoV-2 viral copies this assay can detect is 250  copies / mL. A negative result does not preclude SARS-CoV-2 infection  and should  not be used as the sole basis for treatment or other  patient management decisions.  A negative result may occur with  improper specimen collection / handling, submission of specimen other  than nasopharyngeal swab, presence of viral mutation(s) within the  areas targeted by this assay, and inadequate number of viral copies  (<250 copies / mL). A negative result must be combined with clinical  observations, patient history, and epidemiological information. If result is POSITIVE SARS-CoV-2 target nucleic acids are DETECTED. The SARS-CoV-2 RNA is generally detectable in upper and lower  respiratory specimens dur ing the acute phase of infection.  Positive  results are indicative of active infection with SARS-CoV-2.  Clinical  correlation with patient history and other diagnostic information is  necessary to determine patient infection status.  Positive results do  not rule out bacterial infection or co-infection with other viruses. If result is PRESUMPTIVE POSTIVE SARS-CoV-2 nucleic acids MAY BE PRESENT.   A presumptive positive result was obtained on the submitted specimen  and confirmed on repeat testing.  While 2019 novel coronavirus  (SARS-CoV-2) nucleic acids may be present in the submitted sample  additional confirmatory testing may be necessary for epidemiological  and / or clinical management purposes  to differentiate between  SARS-CoV-2 and other Sarbecovirus currently known to infect humans.  If clinically indicated additional testing with an alternate test  methodology [phone]) is advised. The SARS-CoV-2 RNA is generally  detectable in upper and lower respiratory sp ecimens during the acute  phase of infection. The expected result is Negative. Fact Sheet for Patients:  StrictlyIdeas.no Fact Sheet for Healthcare Providers: BankingDealers.co.za This test is not yet approved or cleared by the [place] FDA and has been  authorized for detection and/or diagnosis of SARS-CoV-2 by FDA under an Emergency Use Authorization (EUA).  This EUA will remain in effect (meaning this test can be used) for the duration of the COVID-19 declaration under Section 564(b)(1) of the Act, 21 U.S.C. section 360bbb-3(b)(1), unless the authorization is terminated or revoked sooner. Performed at [facility], [place],  [address]   C Difficile Quick Screen w PCR reflex     Status: Abnormal   Collection Time: 05/29/18  1:23 PM  Result Value Ref Range Status   C Diff antigen POSITIVE (A) NEGATIVE Final   C Diff toxin POSITIVE (A) NEGATIVE Final   C Diff  interpretation Toxin producing C. difficile detected.  Final    CommentLarwance Rote AT 1433 05/29/2018 KMP Performed at [facility], [address], [place], [place] [address]        Management plans discussed with the patient, family (son yesterday) and they are in agreement.  CODE STATUS:     Code Status Orders  (From admission, onward)         Start     Ordered   05/29/18 0016  Do not attempt resuscitation (DNR)  Continuous    Question Answer Comment  In the event of cardiac or respiratory ARREST Do not call a "code blue"   In the event of cardiac or respiratory ARREST Do not perform Intubation, CPR, defibrillation or ACLS   In the event of cardiac or respiratory ARREST Use medication by any route, position, wound care, and other measures to relive pain and suffering. May use oxygen, suction and manual treatment of airway obstruction as needed for comfort.   Comments per patient's only child the patient is to be DNR per his father's prior expressed wishes. Still ok to receive blood, antibiotics, and other care but NO intubation and NO CPR      05/29/18 0015        Code Status History    Date Active Date Inactive Code Status Order ID Comments User Context   05/29/2018 0016 05/29/2018 0016 DNR [id number]  Mayer Camel, NP ED   05/02/2018 1427 05/26/2018 2140 DNR [id number]  Vaughan Basta, MD Inpatient   05/02/2018 0804 05/02/2018 1427 DNR [id number]  Delman Kitten, MD ED   03/13/2018 1929 03/14/2018 2030 Full Code [id number]  Nena Polio, MD ED   11/30/2017 1846 12/01/2017 2040 Full Code [id number]  Thornton Park, MD Inpatient   11/30/2017 1601 11/30/2017 1846 Full Code [id number]  Demetrios Loll, MD Inpatient      TOTAL TIME TAKING CARE OF THIS PATIENT: 35 minutes.    Loletha Grayer M.D on 06/17/2018 at 10:05 AM  Between 7am to 6pm - Pager - [phone]  After 6pm go to [email or web address] - password [email or web address]  [facility] Office  [phone]  CC: Primary care physician; Leone Haven, MD

## 2018-06-18 ENCOUNTER — Telehealth: Payer: Self-pay

## 2018-06-18 NOTE — Telephone Encounter (Signed)
Copied from [place] [phone]. Topic: General - Other >> Jun 18, 2018 12:15 PM Leward Quan A wrote: Reason for CRM: Per caller patient recently moved into a facility and is having trouble sleeping. He states that patient is in need of something to aide in helping him to go to sleep at night. Ph# [phone]

## 2018-06-18 NOTE — Telephone Encounter (Addendum)
Transition Care Management Follow-up Telephone Call  Information received from caregiver, Madelynn Done, HIPAA compliant   Date discharged? 06/17/18   How have you been since you were released from the hospital? Did not get a good nights sleep which is believed to make it harder to adjust.  Seems to be doing ok but he would rather be at his home.  Diarrhea has ceased and little to no abdominal pain. Drinking fluids.     Do you understand why you were in the hospital? ABD pain, diarrhea, dehydration.   Do you understand the discharge instructions? Follow up with pcp in 5 days, follow up with blood counts (last hgb 7.8) as outpatient, general surgery as outpatient and nephrology as outpatient.    Where were you discharged to? Group home.   Items Reviewed:  Medications reviewed: Yes, discontinued antihypertensive medication due to low blood pressure.   Allergies reviewed: Yes  Dietary changes reviewed: Yes  Referrals reviewed: Yes   Functional Questionnaire:   Activities of Daily Living (ADLs):   He states they are independent in the following: self feed,  toileting, grooming, ambulating. States they require assistance with the following: bathing/dressing, meal prep.   Any transportation issues/concerns?: None.   Any patient concerns? Request increase dose trazodone 50mg  higher for better rest at night. Add as a schedule medication and send to [facility]. Point of contact at [facility] is [place].    Confirmed importance and date/time of follow-up visits scheduled Yes, virtual appointment scheduled 06/19/18.   Provider Appointment booked with Dr. Caryl Bis, PCP.   Confirmed with patient if condition begins to worsen call PCP or go to the ER.  Patient was given the office number and encouraged to call back with question or concerns.  : Yes

## 2018-06-19 ENCOUNTER — Other Ambulatory Visit: Payer: Self-pay

## 2018-06-19 ENCOUNTER — Encounter: Payer: Self-pay | Admitting: Family Medicine

## 2018-06-19 ENCOUNTER — Ambulatory Visit (INDEPENDENT_AMBULATORY_CARE_PROVIDER_SITE_OTHER): Payer: Medicare HMO | Admitting: Family Medicine

## 2018-06-19 DIAGNOSIS — F329 Major depressive disorder, single episode, unspecified: Secondary | ICD-10-CM

## 2018-06-19 DIAGNOSIS — K264 Chronic or unspecified duodenal ulcer with hemorrhage: Secondary | ICD-10-CM

## 2018-06-19 DIAGNOSIS — R69 Illness, unspecified: Secondary | ICD-10-CM | POA: Diagnosis not present

## 2018-06-19 DIAGNOSIS — I10 Essential (primary) hypertension: Secondary | ICD-10-CM

## 2018-06-19 DIAGNOSIS — N183 Chronic kidney disease, stage 3 unspecified: Secondary | ICD-10-CM

## 2018-06-19 DIAGNOSIS — E43 Unspecified severe protein-calorie malnutrition: Secondary | ICD-10-CM

## 2018-06-19 DIAGNOSIS — R1013 Epigastric pain: Secondary | ICD-10-CM | POA: Diagnosis not present

## 2018-06-19 DIAGNOSIS — F32A Depression, unspecified: Secondary | ICD-10-CM

## 2018-06-19 DIAGNOSIS — F419 Anxiety disorder, unspecified: Secondary | ICD-10-CM

## 2018-06-19 MED ORDER — TRAZODONE HCL 100 MG PO TABS: 100.0000 mg | ORAL_TABLET | Freq: Every day | ORAL | 3 refills | Status: DC

## 2018-06-19 NOTE — Addendum Note (Signed)
Addended by: Leone Haven on: 06/19/2018 04:42 PM   Modules accepted: Orders

## 2018-06-19 NOTE — Assessment & Plan Note (Signed)
We will recheck his renal function with lab work.  Will refer to nephrology.

## 2018-06-19 NOTE — Assessment & Plan Note (Signed)
Improved.  Suspect this was related to the ulcer that has been removed surgically.

## 2018-06-19 NOTE — Assessment & Plan Note (Signed)
I discussed that it may take a number of weeks up to 2 months for Remeron to become beneficial.  We will also treat sleep with trazodone.  Prescription for trazodone sent to pharmacy.  Given ED precautions regarding suicidal ideation if he were to develop this.

## 2018-06-19 NOTE — Assessment & Plan Note (Signed)
Currently off of medication due to low blood pressure in the hospital.  I have asked that they start checking his blood pressure daily.  We will get him scheduled for 1 month follow-up to follow-up on his blood pressure.

## 2018-06-19 NOTE — Progress Notes (Signed)
Virtual Visit via telephone Note  This visit type was conducted due to national recommendations for restrictions regarding the COVID-19 pandemic (e.g. social distancing).  This format is felt to be most appropriate for this patient at this time.  All issues noted in this document were discussed and addressed.  No physical exam was performed (except for noted visual exam findings with Video Visits).   I connected with Justin Andrews today at  1:45 PM EDT by telephone and verified that I am speaking with the correct person using two identifiers. Location patient: home Location provider: work Persons participating in the virtual visit: patient, provider, Madelynn Done (caregiver at group home)  I discussed the limitations, risks, security and privacy concerns of performing an evaluation and management service by telephone and the availability of in person appointments. I also discussed with the patient that there may be a patient responsible charge related to this service. The patient expressed understanding and agreed to proceed.  Interactive audio and video telecommunications were attempted between this provider and patient, however failed, due to patient having technical difficulties OR patient did not have access to video capability.  We continued and completed visit with audio only.   Reason for visit: Hospital follow-up  HPI: GI bleed: Patient was previously hospitalized after he was found to be significantly anemic related to an upper GI bleed.  He underwent surgical removal of a portion of his duodenum as there was a persistently bleeding ulcer.  He notes minimal abdominal discomfort that is much improved from previously.  He notes no blood in his stool.  No melena.  Hemoglobin remained low on his most recent hospitalization.  Diarrhea: Patient was found to have C. difficile.  He notes his stools have been significantly improved since completing vancomycin.  He noted a loose stool 2 days ago though  have been normal since then.  No vomiting or significant nausea.  His prior abdominal pain is significantly improved.  Hypertension: He is no longer on blood pressure medications due to low blood pressures in the hospital.  They have not been checking his blood pressure.  Depression: He does remain depressed.  He was evaluated by psychiatry in the hospital for capacity and was initially deemed to not have capacity to make medical decisions.  He was found to be depressed and was noting suicidal ideation in the hospital.  He has been on Remeron for about 2 weeks.  He denies SI.  He notes mild anxiety.  He notes he is not sleeping all that well.  His caregiver notes that they typically have people go to bed between 9 and 10 PM and they want to know if we can increase his trazodone 200 mg daily to see if that would be beneficial.  They know they are trying Ensure for the patient for nutritional status but the patient is unable to afford this so they wonder if they can use [employer] breakfast essentials.  CKD: Kidney function did worsen while in the hospital likely related to hypoperfusion from his significant anemia.  He needs to establish with nephrology.  ROS: See pertinent positives and negatives per HPI.  Past Medical History:  Diagnosis Date  . C. difficile diarrhea 11/2017  . Elevated serum creatinine   . History of melanoma   . Hypertension   . Osteoarthritis   . Tobacco abuse     Past Surgical History:  Procedure Laterality Date  . ANKLE FRACTURE SURGERY    . CENTRAL VENOUS CATHETER INSERTION  05/08/2018  Procedure: INSERTION CENTRAL LINE ADULT;  Surgeon: Olean Ree, MD;  Location: [facility] OR[facility];  Service: General;;  . ESOPHAGOGASTRODUODENOSCOPY  05/08/2018   Procedure: ESOPHAGOGASTRODUODENOSCOPY (EGD);  Surgeon: Olean Ree, MD;  Location: [facility];  Service: General;;  . ESOPHAGOGASTRODUODENOSCOPY (EGD) WITH PROPOFOL N/A 05/03/2018   Procedure: ESOPHAGOGASTRODUODENOSCOPY (EGD) WITH  PROPOFOL;  Surgeon: [place], Benay Pike, MD;  Location: [facility];  Service: Gastroenterology;  Laterality: N/A;  . EXTERNAL EAR SURGERY    . INTRAMEDULLARY (IM) NAIL INTERTROCHANTERIC Left 12/01/2017   Procedure: INTRAMEDULLARY (IM) NAIL INTERTROCHANTRIC;  Surgeon: Thornton Park, MD;  Location: [facility] OR[facility];  Service: Orthopedics;  Laterality: Left;  . JEJUNOSTOMY  05/08/2018   Procedure: JEJUNOSTOMY;  Surgeon: Olean Ree, MD;  Location: [facility] OR[facility];  Service: General;;  . LAPAROTOMY N/A 05/08/2018   Procedure: EXPLORATORY LAPAROTOMY;  Surgeon: Olean Ree, MD;  Location: [facility] ORS;  Service: General;  Laterality: N/A;  . ROTATOR CUFF REPAIR    . TONSILLECTOMY    . TYMPANOPLASTY W/ MASTOIDECTOMY     Patient with reports of mastoid surgery (appears to have had surgery on TM; Left).  . VISCERAL ARTERY INTERVENTION N/A 05/04/2018   Procedure: VISCERAL ARTERY INTERVENTION;  Surgeon: Algernon Huxley, MD;  Location: [facility];  Service: Cardiovascular;  Laterality: N/A;    Family History  Problem Relation Age of Onset  . Prostate cancer Neg Hx   . Bladder Cancer Neg Hx   . Kidney cancer Neg Hx     SOCIAL HX: Smoker.   Current Outpatient Medications:  .  ALPRAZolam (XANAX) 0.25 MG tablet, Take 1 tablet (0.25 mg total) by mouth 2 (two) times daily as needed for anxiety., Disp: 12 tablet, Rfl: 0 .  calcium-vitamin D (OSCAL WITH D) 500-200 MG-UNIT tablet, Take 1 tablet by mouth 2 (two) times daily., Disp: 60 tablet, Rfl: 0 .  feeding supplement, ENSURE ENLIVE, (ENSURE ENLIVE) LIQD, Take 237 mLs by mouth 2 (two) times daily between meals., Disp: 60 Bottle, Rfl: 0 .  ferrous sulfate 325 (65 FE) MG tablet, Take 1 tablet (325 mg total) by mouth daily with breakfast., Disp: 30 tablet, Rfl: 0 .  folic acid (FOLVITE) 1 MG tablet, Take 1 tablet (1 mg total) by mouth daily., Disp: 30 tablet, Rfl: 0 .  mirtazapine (REMERON) 30 MG tablet, Take 0.5 tablets (15 mg total) by mouth at bedtime., Disp:  30 tablet, Rfl: 0 .  pantoprazole (PROTONIX) 40 MG tablet, Take 1 tablet (40 mg total) by mouth daily., Disp: 30 tablet, Rfl: 0 .  tamsulosin (FLOMAX) 0.4 MG CAPS capsule, Take 1 capsule (0.4 mg total) by mouth daily., Disp: 30 capsule, Rfl: 0 .  thiamine 100 MG tablet, Take 1 tablet (100 mg total) by mouth daily., Disp: 30 tablet, Rfl: 0 .  traZODone (DESYREL) 100 MG tablet, Take 1 tablet (100 mg total) by mouth at bedtime., Disp: 30 tablet, Rfl: 3  EXAM: This was a telehealth telephone visit and thus no physical exam was completed.  ASSESSMENT AND PLAN:  Discussed the following assessment and plan:  Hypertension Currently off of medication due to low blood pressure in the hospital.  I have asked that they start checking his blood pressure daily.  We will get him scheduled for 1 month follow-up to follow-up on his blood pressure.  GI bleed Denies any evidence of active bleeding.  Needs a hemoglobin rechecked.  This will be scheduled.  CKD (chronic kidney disease) stage 3, GFR 30-59 ml/min We will recheck his renal function with  lab work.  Will refer to nephrology.  Abdominal pain Improved.  Suspect this was related to the ulcer that has been removed surgically.  Anxiety and depression I discussed that it may take a number of weeks up to 2 months for Remeron to become beneficial.  We will also treat sleep with trazodone.  Prescription for trazodone sent to pharmacy.  Given ED precautions regarding suicidal ideation if he were to develop this.  Protein-calorie malnutrition, severe I noted it would be okay for him to try breakfast essentials since these would be more affordable.  Will fax a prescription for these to [phone].  The caregiver noted that this was the fax #2 the [facility] pharmacy that they use.  [place] office staff will contact the patient to schedule follow-up and labs.   I discussed the assessment and treatment plan with the patient. The patient was  provided an opportunity to ask questions and all were answered. The patient agreed with the plan and demonstrated an understanding of the instructions.   The patient was advised to call back or seek an in-person evaluation if the symptoms worsen or if the condition fails to improve as anticipated.  I provided 25 minutes of non-face-to-face time during this encounter.   [name], MD

## 2018-06-19 NOTE — Assessment & Plan Note (Signed)
Denies any evidence of active bleeding.  Needs a hemoglobin rechecked.  This will be scheduled.

## 2018-06-19 NOTE — Assessment & Plan Note (Signed)
I noted it would be okay for him to try breakfast essentials since these would be more affordable.  Will fax a prescription for these to [phone].  The caregiver noted that this was the fax #2 the [facility] pharmacy that they use.

## 2018-06-22 MED ORDER — CARNATION BREAKFAST ESSENTIALS PO LIQD: 1.0000 | Freq: Two times a day (BID) | ORAL | 1 refills | Status: DC

## 2018-06-22 NOTE — Addendum Note (Signed)
Addended by: Leone Haven on: 06/22/2018 05:59 PM   Modules accepted: Orders

## 2018-06-22 NOTE — Telephone Encounter (Signed)
Prescription for higher dose trazodone created.  Please fax this along with a prescription for [employer] breakfast essentials to [facility] at [phone].  These prescriptions were placed on Donna's desk.

## 2018-06-23 NOTE — Telephone Encounter (Signed)
Left voice message at [phone] that Rx has been sent in for patient.

## 2018-06-23 NOTE — Telephone Encounter (Signed)
Prescriptions have been faxed to [facility].

## 2018-06-24 ENCOUNTER — Telehealth: Payer: Self-pay

## 2018-06-24 NOTE — Telephone Encounter (Signed)
Copied from [place] [phone]. Topic: General - Other >> Jun 24, 2018 10:24 AM Nils Flack wrote: Reason for CRM: pt said that he has a very person l question to ask provider, would not give me any details.  Is asking for a call back  [phone]

## 2018-06-24 NOTE — Telephone Encounter (Signed)
Please see if you can contact the patient and see what he would like to discuss.  Thanks.

## 2018-06-25 ENCOUNTER — Telehealth: Payer: Self-pay

## 2018-06-25 NOTE — Telephone Encounter (Signed)
Copied from [place] [phone]. Topic: General - Other >> Jun 24, 2018 10:24 AM Justin Andrews wrote: Reason for CRM: pt said that he has a very person l question to ask provider, would not give me any details.  Is asking for a call back  [phone]

## 2018-06-26 ENCOUNTER — Ambulatory Visit (INDEPENDENT_AMBULATORY_CARE_PROVIDER_SITE_OTHER): Payer: Medicare HMO | Admitting: Family Medicine

## 2018-06-26 ENCOUNTER — Encounter: Payer: Self-pay | Admitting: Family Medicine

## 2018-06-26 ENCOUNTER — Encounter: Payer: Self-pay | Admitting: Surgery

## 2018-06-26 ENCOUNTER — Ambulatory Visit (INDEPENDENT_AMBULATORY_CARE_PROVIDER_SITE_OTHER): Payer: Medicare HMO | Admitting: Surgery

## 2018-06-26 ENCOUNTER — Other Ambulatory Visit: Payer: Self-pay

## 2018-06-26 VITALS — BP 170/79 | HR 96 | Temp 97.5°F | Ht 69.0 in | Wt 121.0 lb

## 2018-06-26 DIAGNOSIS — M25552 Pain in left hip: Secondary | ICD-10-CM | POA: Diagnosis not present

## 2018-06-26 DIAGNOSIS — Z658 Other specified problems related to psychosocial circumstances: Secondary | ICD-10-CM | POA: Diagnosis not present

## 2018-06-26 DIAGNOSIS — I10 Essential (primary) hypertension: Secondary | ICD-10-CM

## 2018-06-26 DIAGNOSIS — G8929 Other chronic pain: Secondary | ICD-10-CM

## 2018-06-26 DIAGNOSIS — M25551 Pain in right hip: Secondary | ICD-10-CM

## 2018-06-26 DIAGNOSIS — Z09 Encounter for follow-up examination after completed treatment for conditions other than malignant neoplasm: Secondary | ICD-10-CM

## 2018-06-26 DIAGNOSIS — K264 Chronic or unspecified duodenal ulcer with hemorrhage: Secondary | ICD-10-CM

## 2018-06-26 NOTE — Progress Notes (Signed)
Virtual Visit via telephone Note  This visit type was conducted due to national recommendations for restrictions regarding the COVID-19 pandemic (e.g. social distancing).  This format is felt to be most appropriate for this patient at this time.  All issues noted in this document were discussed and addressed.  No physical exam was performed (except for noted visual exam findings with Video Visits).   I connected with Justin Andrews today at 11:30 AM EDT by telephone and verified that I am speaking with the correct person using two identifiers. Location patient: home Location provider: work Persons participating in the virtual visit: patient, provider  I discussed the limitations, risks, security and privacy concerns of performing an evaluation and management service by telephone and the availability of in person appointments. I also discussed with the patient that there may be a patient responsible charge related to this service. The patient expressed understanding and agreed to proceed.  Interactive audio and video telecommunications were attempted between this provider and patient, however failed, due to patient having technical difficulties OR patient did not have access to video capability.  We continued and completed visit with audio only.   Reason for visit: follow-up  HPI: Bilateral hip pain: Patient notes this continues to bother him.  He is taking 1 tramadol in the morning.  There is a nurse that is controlling his medications.  No drowsiness with this.  No alcohol intake.  He notes he was never contacted by pain management or orthopedics.  Hypertension: Blood pressure was elevated at the general surgeon's office today.  He does not check it at his new facility.  No chest pain or shortness of breath  Patient notes that his biggest concern today is that he feels as though his son has thrown him under the bus with where he is currently having to live.  He notes it is just a place to sleep.   The food is not good.  He notes the person at the facility is not nice to him.  He does note that he has not heard from a Education officer, museum or Adult [employer] since arriving there.   ROS: See pertinent positives and negatives per HPI.  Past Medical History:  Diagnosis Date  . C. difficile diarrhea 11/2017  . Elevated serum creatinine   . History of melanoma   . Hypertension   . Osteoarthritis   . Tobacco abuse     Past Surgical History:  Procedure Laterality Date  . ANKLE FRACTURE SURGERY    . CENTRAL VENOUS CATHETER INSERTION  05/08/2018   Procedure: INSERTION CENTRAL LINE ADULT;  Surgeon: Olean Ree, MD;  Location: [facility];  Service: General;;  . ESOPHAGOGASTRODUODENOSCOPY  05/08/2018   Procedure: ESOPHAGOGASTRODUODENOSCOPY (EGD);  Surgeon: Olean Ree, MD;  Location: [facility];  Service: General;;  . ESOPHAGOGASTRODUODENOSCOPY (EGD) WITH PROPOFOL N/A 05/03/2018   Procedure: ESOPHAGOGASTRODUODENOSCOPY (EGD) WITH PROPOFOL;  Surgeon: Toledo, Benay Pike, MD;  Location: [facility];  Service: Gastroenterology;  Laterality: N/A;  . EXTERNAL EAR SURGERY    . INTRAMEDULLARY (IM) NAIL INTERTROCHANTERIC Left 12/01/2017   Procedure: INTRAMEDULLARY (IM) NAIL INTERTROCHANTRIC;  Surgeon: Thornton Park, MD;  Location: [facility];  Service: Orthopedics;  Laterality: Left;  . JEJUNOSTOMY  05/08/2018   Procedure: JEJUNOSTOMY;  Surgeon: Olean Ree, MD;  Location: [facility];  Service: General;;  . LAPAROTOMY N/A 05/08/2018   Procedure: EXPLORATORY LAPAROTOMY;  Surgeon: Olean Ree, MD;  Location: [facility];  Service: General;  Laterality: N/A;  . ROTATOR CUFF REPAIR    .  TONSILLECTOMY    . TYMPANOPLASTY W/ MASTOIDECTOMY     Patient with reports of mastoid surgery (appears to have had surgery on TM; Left).  . VISCERAL ARTERY INTERVENTION N/A 05/04/2018   Procedure: VISCERAL ARTERY INTERVENTION;  Surgeon: Algernon Huxley, MD;  Location: [facility];  Service: Cardiovascular;   Laterality: N/A;    Family History  Problem Relation Age of Onset  . Prostate cancer Neg Hx   . Bladder Cancer Neg Hx   . Kidney cancer Neg Hx     SOCIAL HX: Smoker   Current Outpatient Medications:  .  ALPRAZolam (XANAX) 0.25 MG tablet, Take 1 tablet (0.25 mg total) by mouth 2 (two) times daily as needed for anxiety., Disp: 12 tablet, Rfl: 0 .  calcium-vitamin D (OSCAL WITH D) 500-200 MG-UNIT tablet, Take 1 tablet by mouth 2 (two) times daily., Disp: 60 tablet, Rfl: 0 .  ferrous sulfate 325 (65 FE) MG tablet, Take 1 tablet (325 mg total) by mouth daily with breakfast., Disp: 30 tablet, Rfl: 0 .  folic acid (FOLVITE) 1 MG tablet, Take 1 tablet (1 mg total) by mouth daily., Disp: 30 tablet, Rfl: 0 .  mirtazapine (REMERON) 30 MG tablet, Take 0.5 tablets (15 mg total) by mouth at bedtime., Disp: 30 tablet, Rfl: 0 .  Nutritional Supplements (CARNATION BREAKFAST ESSENTIALS) LIQD, Take 1 Bottle by mouth 2 (two) times daily between meals., Disp: 60 Bottle, Rfl: 1 .  pantoprazole (PROTONIX) 40 MG tablet, Take 1 tablet (40 mg total) by mouth daily., Disp: 30 tablet, Rfl: 0 .  tamsulosin (FLOMAX) 0.4 MG CAPS capsule, Take 1 capsule (0.4 mg total) by mouth daily., Disp: 30 capsule, Rfl: 0 .  thiamine 100 MG tablet, Take 1 tablet (100 mg total) by mouth daily., Disp: 30 tablet, Rfl: 0 .  traZODone (DESYREL) 100 MG tablet, Take 1 tablet (100 mg total) by mouth at bedtime., Disp: 30 tablet, Rfl: 3  EXAM: This was a telehealth telephone visit notes no physical exam was completed  ASSESSMENT AND PLAN:  Discussed the following assessment and plan:  Hypertension Elevated today at the surgeon's office though his blood pressure has been low in the hospital.  I did discuss having him get his blood pressure checked at his facility and monitoring his blood pressure.  Chronic hip pain Bilateral.  Patient needs to see orthopedics and pain management.  Referrals placed again.  Insufficient social support  We will have somebody from our office contact [facility] to see if the patient still has a case open with the social worker helping him.  If he does not we will get him set up with home health social worker.  [place] office staff will contact the patient and get him scheduled for follow-up in 3 months.   I discussed the assessment and treatment plan with the patient. The patient was provided an opportunity to ask questions and all were answered. The patient agreed with the plan and demonstrated an understanding of the instructions.   The patient was advised to call back or seek an in-person evaluation if the symptoms worsen or if the condition fails to improve as anticipated.  I provided 16 minutes of non-face-to-face time during this encounter.   [name], MD

## 2018-06-26 NOTE — Progress Notes (Signed)
06/26/2018  HPI: JERIKO KOWALKE is a 84 y.o. male s/p exploratory laparotomy, primary repair of bleeding duodenal ulcer, duodenal plasty, and feeding jejunostomy tube placement on 05/08/2018.  She presents today for follow-up.  Has been hospitalized until 5/19 for his primary surgery and then presented to the hospital again on 5/21 with diarrhea positive for C. difficile and was discharged again on 6/10.  He presents today denies any troubles.  Reports eating well no problems with the incisions.  Vital signs: BP (!) 170/79   Pulse 96   Temp (!) 97.5 F (36.4 C) (Temporal)   Ht 5\' 9"  (1.753 m)   Wt 121 lb (54.9 kg)   SpO2 96%   BMI 17.87 kg/m    Physical Exam: Constitutional: No acute distress Abdomen: Soft, nondistended, nontender to palpation.  Midline incision is well-healed without any evidence of hernia.  Patient still has a feeding jejunostomy tube in place. this was removed at bedside without any complications.  Dry dressing was applied  Assessment/Plan: This is a 84 y.o. male s/p Croitoru laparotomy and primary repair of bleeding duodenal ulcer with jejunostomy tube placement  - Jejunostomy tube removed today now that he is 7 weeks postop without any complications.  Dry gauze dressing applied.  Instructed that he should change the dressing once daily and as needed to keep the wound dry until it is healed. -Patient may have diet as tolerated and activity as tolerated. -Patient may follow-up as needed   Melvyn Neth, [facility]

## 2018-06-26 NOTE — Patient Instructions (Addendum)
We removed the tube today, however you will need to keep a dressing over the area until it heals completely.   You may shower as usual.       GENERAL POST-OPERATIVE PATIENT INSTRUCTIONS   WOUND CARE INSTRUCTIONS:  Keep a dry clean dressing on the wound if there is drainage. The initial bandage may be removed after 24 hours.  Once the wound has quit draining you may leave it open to air.  If clothing rubs against the wound or causes irritation and the wound is not draining you may cover it with a dry dressing during the daytime.  Try to keep the wound dry and avoid ointments on the wound unless directed to do so.  If the wound becomes bright red and painful or starts to drain infected material that is not clear, please contact your physician immediately.  If the wound is mildly pink and has a thick firm ridge underneath it, this is normal, and is referred to as a healing ridge.  This will resolve over the next 4-6 weeks.  BATHING: You may shower if you have been informed of this by your surgeon. However, Please do not submerge in a tub, hot tub, or pool until incisions are completely sealed or have been told by your surgeon that you may do so.  DIET:  You may eat any foods that you can tolerate.  It is a good idea to eat a high fiber diet and take in plenty of fluids to prevent constipation.  If you do become constipated you may want to take a mild laxative or take ducolax tablets on a daily basis until your bowel habits are regular.  Constipation can be very uncomfortable, along with straining, after recent surgery.  ACTIVITY:  You are encouraged to cough and deep breath or use your incentive spirometer if you were given one, every 15-30 minutes when awake.  This will help prevent respiratory complications and low grade fevers post-operatively if you had a general anesthetic.  You may want to hug a pillow when coughing and sneezing to add additional support to the surgical area, if you had  abdominal or chest surgery, which will decrease pain during these times.  You are encouraged to walk and engage in light activity for the next two weeks.  You should not lift more than 20 pounds, until 06/26/2018 as it could put you at increased risk for complications.  Twenty pounds is roughly equivalent to a plastic bag of groceries. At that time- Listen to your body when lifting, if you have pain when lifting, stop and then try again in a few days. Soreness after doing exercises or activities of daily living is normal as you get back in to your normal routine.  MEDICATIONS:  Try to take narcotic medications and anti-inflammatory medications, such as tylenol, ibuprofen, naprosyn, etc., with food.  This will minimize stomach upset from the medication.  Should you develop nausea and vomiting from the pain medication, or develop a rash, please discontinue the medication and contact your physician.  You should not drive, make important decisions, or operate machinery when taking narcotic pain medication.  SUNBLOCK Use sun block to incision area over the next year if this area will be exposed to sun. This helps decrease scarring and will allow you avoid a permanent darkened area over your incision.  QUESTIONS:  Please feel free to call our office if you have any questions, and we will be glad to assist you. [phone]

## 2018-06-26 NOTE — Assessment & Plan Note (Addendum)
Elevated today at the surgeon's office though his blood pressure has been low in the hospital.  I did discuss having him get his blood pressure checked at his facility and monitoring his blood pressure.

## 2018-06-27 DIAGNOSIS — Z658 Other specified problems related to psychosocial circumstances: Secondary | ICD-10-CM | POA: Insufficient documentation

## 2018-06-27 NOTE — Assessment & Plan Note (Signed)
We will have somebody from our office contact [facility] to see if the patient still has a case open with the social worker helping him.  If he does not we will get him set up with home health social worker.

## 2018-06-27 NOTE — Assessment & Plan Note (Signed)
Bilateral.  Patient needs to see orthopedics and pain management.  Referrals placed again.

## 2018-06-29 ENCOUNTER — Telehealth: Payer: Self-pay | Admitting: Family Medicine

## 2018-06-29 DIAGNOSIS — M25551 Pain in right hip: Secondary | ICD-10-CM

## 2018-06-29 DIAGNOSIS — M25552 Pain in left hip: Secondary | ICD-10-CM

## 2018-06-29 DIAGNOSIS — C4322 Malignant melanoma of left ear and external auricular canal: Secondary | ICD-10-CM

## 2018-06-29 NOTE — Telephone Encounter (Signed)
Pt had a telephone visit with the provider recently.  Nina,cma

## 2018-06-29 NOTE — Telephone Encounter (Signed)
Pt is requesting a referral to the dermatologist. Pt has a up coming appt with Dr. Caryl Bis on 07/17/2018.

## 2018-06-29 NOTE — Telephone Encounter (Signed)
Medication Refill - Medication: traMADol (ULTRAM) tablet 50 mg  Has the patient contacted their pharmacy? Yes no refills. States he is completely out. Preferred Pharmacy (with phone number or street name): [id number] [address], [place] [phone] (Phone) [phone] (Fax)   Agent: Please be advised that RX refills may take up to 3 business days. We ask that you follow-up with your pharmacy.

## 2018-06-29 NOTE — Telephone Encounter (Signed)
Last OV-6/19  I didn't see this medication listed in patient's med list in chart.

## 2018-06-30 ENCOUNTER — Telehealth: Payer: Self-pay | Admitting: Family Medicine

## 2018-06-30 NOTE — Telephone Encounter (Signed)
Justin Andrews at [facility] is [phone]

## 2018-06-30 NOTE — Telephone Encounter (Signed)
Pt sates that [facility] did not receive traZODone (DESYREL) 100 MG tablet  [place], [place]. [phone] (Phone) [phone] (Fax)   Please resend

## 2018-07-01 DIAGNOSIS — H269 Unspecified cataract: Secondary | ICD-10-CM | POA: Diagnosis not present

## 2018-07-01 NOTE — Telephone Encounter (Signed)
Thomas from [facility] is checking on status regarding the medication trazodone, thomas needs a new presciption send from PCP for 100mg   Marcello Moores is off today, so please give order to  [place]

## 2018-07-01 NOTE — Telephone Encounter (Signed)
The pharmacy called because the pt should be on 100 mg of trazadone the RX is for 50 but the sig states take 1 tablet (100mg  total), the RX needs to be for 100 mg and sent to [facility].  nina,cma

## 2018-07-02 ENCOUNTER — Telehealth: Payer: Self-pay

## 2018-07-02 MED ORDER — TRAZODONE HCL 100 MG PO TABS: 100.0000 mg | ORAL_TABLET | Freq: Every day | ORAL | 3 refills | Status: DC

## 2018-07-02 NOTE — Telephone Encounter (Signed)
Pharmacy stated they received the RX and thanks.  Nina,cma

## 2018-07-02 NOTE — Telephone Encounter (Signed)
Copied from [place] [id number]. Topic: General - Other >> Jul 02, 2018 11:46 AM Keene Breath wrote: Reason for CRM: Patient called to request that the nurse or doctor give him a call.  He stated he has a few questions and needs a script for pain medication.  CB# [phone]

## 2018-07-02 NOTE — Telephone Encounter (Signed)
I had a long discussion with the patient previously about pain medication. He was previously taking many more that was prescribed and was abusing alcohol and I advised him that I would no longer be prescribing his pain medication because of that. He has been referred to orthopedics and pain management. Please see if he has been contacted regarding an appointment with them. He is living in a group home now and he mentioned that someone was managing his medications for him. Please see if he will allow [place] to speak with someone at that group home to confirm that there is someone that distributes the medication and find out if the patient would have access to his medication to take on his own. Thanks.

## 2018-07-02 NOTE — Telephone Encounter (Signed)
I will resend this medication. Please confirm that they received it.  Thanks.

## 2018-07-03 NOTE — Telephone Encounter (Signed)
The patient has called twice once for a referral to dermatology and he needs a refill on trazadone.  He also states he needs pain medication.  Dozier Berkovich,cma

## 2018-07-03 NOTE — Telephone Encounter (Signed)
Trazodone was sent to his pharmacy yesterday.  I have placed a referral to dermatology.  I will send this to [facility] so she can send the referral over urgently given that he has had melanoma that he is not followed up for.  Please see the phone message from 06/29/2018 regarding his pain medication.  This was sent to [facility].

## 2018-07-06 ENCOUNTER — Other Ambulatory Visit: Payer: Self-pay | Admitting: Family Medicine

## 2018-07-06 NOTE — Progress Notes (Deleted)
Patient's Name: Justin Andrews  MRN: [id number]  Referring Provider: Leone Haven, MD  DOB: [birth year]  PCP: Leone Haven, MD  DOS: 07/07/2018  Note by: Gillis Santa, MD  Service setting: Ambulatory outpatient  Specialty: Interventional Pain Management  Location: [facility] (AMB) Pain Management Facility  Visit type: Initial Patient Evaluation  Patient type: New Patient   Primary Reason(s) for Visit: Encounter for initial evaluation of one or more chronic problems (new to examiner) potentially causing chronic pain, and posing a threat to normal musculoskeletal function. (Level of risk: High) CC: No chief complaint on file.  HPI  Justin Andrews is a 84 y.o. year old, male patient, who comes today to see Justin Andrews for the first time for an initial evaluation of his chronic pain. He has Hypertension; Osteoarthritis; Tobacco abuse; Sleep disturbance; CKD (chronic kidney disease) stage 3, GFR 30-59 ml/min ([place]); Anemia; Anxiety and depression; Weight loss; Prostate nodule; Dysuria; Simple renal cyst; Chronic right upper quadrant pain; Balance problem; 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[place]); DOE (dyspnea on exertion); Basal cell carcinoma; Squamous cell carcinoma of face; Cough; Changes in vision; Proteinuria; Cigarette smoker; Frequency of urination; Diarrhea; Duodenum disorder; Closed left hip fracture ([place]); Protein-calorie malnutrition, severe; Suicidal ideation; Chronic hip pain; RLS (restless legs syndrome); Wrist pain; GI bleed; Hemorrhagic shock ([place]); Altered mental status; Anemia due to blood loss, acute; Abdominal pain; and Insufficient social support on their problem list. Today he comes in for evaluation of his No chief complaint on file.  Pain Assessment: Location:     Radiating:   Onset:   Duration:   Quality:   Severity:  /10 (subjective, self-reported pain score)  Note: Reported level is compatible with observation.                         When using our objective Pain Scale, levels between 6 and  10/10 are said to belong in an emergency room, as it progressively worsens from a 6/10, described as severely limiting, requiring emergency care not usually available at an outpatient pain management facility. At a 6/10 level, communication becomes difficult and requires great effort. Assistance to reach the emergency department may be required. Facial flushing and profuse sweating along with potentially dangerous increases in heart rate and blood pressure will be evident. Effect on ADL:   Timing:   Modifying factors:   BP:    HR:    Onset and Duration: Sudden and Date of onset: 11/2017 Cause of pain: Surgery Severity: No change since onset, NAS-11 at its worse: 6/10, NAS-11 at its best: 6/10, NAS-11 now: 6/10 and NAS-11 on the average: 6/10 Timing: Night, During activity or exercise and After activity or exercise Aggravating Factors: Bending Alleviating Factors: Bending Associated Problems: Pain that does not allow patient to sleep Quality of Pain: Constant Previous Examinations or Tests: Orthopedic evaluation Previous Treatments: Narcotic medications  The patient comes into the clinics today for the first time for a chronic pain management evaluation. ***  Today I took the time to provide the patient with information regarding my pain practice. The patient was informed that my practice is divided into two sections: an interventional pain management section, as well as a completely separate and distinct medication management section. I explained that I have procedure days for my interventional therapies, and evaluation days for follow-ups and medication management. Because of the amount of documentation required during both, they are kept separated. This means that there is the possibility that he  may be scheduled for a procedure on one day, and medication management the next. I have also informed him that because of staffing and facility limitations, I no longer take patients for medication  management only. To illustrate the reasons for this, I gave the patient the example of surgeons, and how inappropriate it would be to refer a patient to his/her care, just to write for the post-surgical antibiotics on a surgery done by a different surgeon.   Because interventional pain management is my board-certified specialty, the patient was informed that joining my practice means that they are open to any and all interventional therapies. I made it clear that this does not mean that they will be forced to have any procedures done. What this means is that I believe interventional therapies to be essential part of the diagnosis and proper management of chronic pain conditions. Therefore, patients not interested in these interventional alternatives will be better served under the care of a different practitioner.  The patient was also made aware of my Comprehensive Pain Management Safety Guidelines where by joining my practice, they limit all of their nerve blocks and joint injections to those done by our practice, for as long as we are retained to manage their care.   Historic Controlled Substance Pharmacotherapy Review  PMP and historical list of controlled substances: ***  Highest opioid analgesic regimen found: ***  Most recent opioid analgesic: ***  Current opioid analgesics: ***  Highest recorded MME/day: *** mg/day MME/day: *** mg/day Medications: The patient did not bring the medication(s) to the appointment, as requested in our "New Patient Package" Pharmacodynamics: Desired effects: Analgesia: The patient reports >50% benefit. Reported improvement in function: The patient reports medication allows him to accomplish basic ADLs. Clinically meaningful improvement in function (CMIF): Sustained CMIF goals met Perceived effectiveness: Described as relatively effective, allowing for increase in activities of daily living (ADL) Undesirable effects: Side-effects or Adverse reactions: None  reported Historical Monitoring: The patient  reports no history of drug use. List of all UDS Test(s): Lab Results  Component Value Date   MDMA NONE DETECTED 03/13/2018   MDMA NONE DETECTED 12/01/2017   MDMA NONE DETECTED 11/30/2017   COCAINSCRNUR NONE DETECTED 03/13/2018   COCAINSCRNUR NONE DETECTED 12/01/2017   COCAINSCRNUR RESULTS UNAVAILABLE DUE TO INTERFERING SUBSTANCE (A) 11/30/2017   PCPSCRNUR NONE DETECTED 03/13/2018   PCPSCRNUR NONE DETECTED 12/01/2017   PCPSCRNUR RESULTS UNAVAILABLE DUE TO INTERFERING SUBSTANCE (A) 11/30/2017   THCU NONE DETECTED 03/13/2018   THCU NONE DETECTED 12/01/2017   THCU RESULTS UNAVAILABLE DUE TO INTERFERING SUBSTANCE (A) 11/30/2017   ETH 90 (H) 03/13/2018   ETH <10 11/30/2017   List of other Serum/Urine Drug Screening Test(s):  Lab Results  Component Value Date   COCAINSCRNUR NONE DETECTED 03/13/2018   COCAINSCRNUR NONE DETECTED 12/01/2017   COCAINSCRNUR RESULTS UNAVAILABLE DUE TO INTERFERING SUBSTANCE (A) 11/30/2017   THCU NONE DETECTED 03/13/2018   THCU NONE DETECTED 12/01/2017   THCU RESULTS UNAVAILABLE DUE TO INTERFERING SUBSTANCE (A) 11/30/2017   ETH 90 (H) 03/13/2018   ETH <10 11/30/2017   Historical Background Evaluation: [place] PMP: PDMP not reviewed this encounter. Six (6) year initial data search conducted.             PMP NARX Score Report:  Narcotic: *** Sedative: *** Stimulant: *** [place] Department of public safety, offender search: Editor, commissioning Information) Non-contributory Risk Assessment Profile: Aberrant behavior: None observed or detected today Risk factors for fatal opioid overdose: None identified today PMP NARX Overdose Risk  Score: *** Fatal overdose hazard ratio (HR): Calculation deferred Non-fatal overdose hazard ratio (HR): Calculation deferred Risk of opioid abuse or dependence: 0.7-3.0% with doses ? 36 MME/day and 6.1-26% with doses ? 120 MME/day. Substance use disorder (SUD) risk level: See below Personal History of  Substance Abuse (SUD-Substance use disorder):  Alcohol:    Illegal Drugs:    Rx Drugs:    ORT Risk Level calculation:    ORT Scoring interpretation table:  Score <3 = Low Risk for SUD  Score between 4-7 = Moderate Risk for SUD  Score >8 = High Risk for Opioid Abuse   PHQ-2 Depression Scale:  Total score:    PHQ-2 Scoring interpretation table: (Score and probability of major depressive disorder)  Score 0 = No depression  Score 1 = 15.4% Probability  Score 2 = 21.1% Probability  Score 3 = 38.4% Probability  Score 4 = 45.5% Probability  Score 5 = 56.4% Probability  Score 6 = 78.6% Probability   PHQ-9 Depression Scale:  Total score:    PHQ-9 Scoring interpretation table:  Score 0-4 = No depression  Score 5-9 = Mild depression  Score 10-14 = Moderate depression  Score 15-19 = Moderately severe depression  Score 20-27 = Severe depression (2.4 times higher risk of SUD and 2.89 times higher risk of overuse)   Pharmacologic Plan: As per protocol, I have not taken over any controlled substance management, pending the results of ordered tests and/or consults.            Initial impression: Pending review of available data and ordered tests.  Meds   Current Outpatient Medications:  .  ALPRAZolam (XANAX) 0.25 MG tablet, Take 1 tablet (0.25 mg total) by mouth 2 (two) times daily as needed for anxiety., Disp: 12 tablet, Rfl: 0 .  calcium-vitamin D (OSCAL WITH D) 500-200 MG-UNIT tablet, Take 1 tablet by mouth 2 (two) times daily., Disp: 60 tablet, Rfl: 0 .  ferrous sulfate 325 (65 FE) MG tablet, Take 1 tablet (325 mg total) by mouth daily with breakfast., Disp: 30 tablet, Rfl: 0 .  folic acid (FOLVITE) 1 MG tablet, Take 1 tablet (1 mg total) by mouth daily., Disp: 30 tablet, Rfl: 0 .  mirtazapine (REMERON) 30 MG tablet, Take 0.5 tablets (15 mg total) by mouth at bedtime., Disp: 30 tablet, Rfl: 0 .  Nutritional Supplements (CARNATION BREAKFAST ESSENTIALS) LIQD, Take 1 Bottle by mouth 2 (two)  times daily between meals., Disp: 60 Bottle, Rfl: 1 .  pantoprazole (PROTONIX) 40 MG tablet, Take 1 tablet (40 mg total) by mouth daily., Disp: 30 tablet, Rfl: 0 .  tamsulosin (FLOMAX) 0.4 MG CAPS capsule, Take 1 capsule (0.4 mg total) by mouth daily., Disp: 30 capsule, Rfl: 0 .  thiamine 100 MG tablet, Take 1 tablet (100 mg total) by mouth daily., Disp: 30 tablet, Rfl: 0 .  traZODone (DESYREL) 100 MG tablet, Take 1 tablet (100 mg total) by mouth at bedtime., Disp: 30 tablet, Rfl: 3  Imaging Review  Cervical Imaging: Cervical MR wo contrast: No results found for this or any previous visit. Cervical MR wo contrast: No procedure found. Cervical MR w/wo contrast: No results found for this or any previous visit. Cervical MR w contrast: No results found for this or any previous visit. Cervical CT wo contrast:  Results for orders placed during the hospital encounter of 11/30/17  CT Cervical Spine Wo Contrast   Narrative CLINICAL DATA:  Minor head trauma  EXAM: CT HEAD WITHOUT CONTRAST  CT CERVICAL SPINE WITHOUT CONTRAST  TECHNIQUE: Multidetector CT imaging of the head and cervical spine was performed following the standard protocol without intravenous contrast. Multiplanar CT image reconstructions of the cervical spine were also generated.  COMPARISON:  11/12/2009 head CT  FINDINGS: CT HEAD FINDINGS  Brain: No evidence of acute infarction, hemorrhage, hydrocephalus, extra-axial collection or mass lesion/mass effect. Moderate low-density in the cerebral white matter attributed to chronic small vessel ischemia. Cerebral volume loss that is generalized  Vascular: Atherosclerotic calcification  Skull: Negative for fracture. Left mastoidectomy with chronically opacified bowl. The middle ear is also opacified today, progressed from 2011.  Orbits: No evidence of injury  CT CERVICAL SPINE FINDINGS  Alignment: No traumatic malalignment.  Skull base and vertebrae: Negative for  fracture  Soft tissues and spinal canal: No prevertebral fluid or swelling. No visible canal hematoma.  Disc levels: Diffuse degenerative disc narrowing and ridging with foraminal stenosis.  Upper chest: Emphysema  IMPRESSION: 1. No evidence of acute intracranial or cervical spine injury. 2. Chronic findings are described above.   Electronically Signed   By: Monte Fantasia M.D.   On: 11/30/2017 13:48    Cervical CT w/wo contrast: No results found for this or any previous visit. Cervical CT w/wo contrast: No results found for this or any previous visit. Cervical CT w contrast: No results found for this or any previous visit. Cervical CT outside: No results found for this or any previous visit. Cervical DG 1 view: No results found for this or any previous visit. Cervical DG 2-3 views: No results found for this or any previous visit. Cervical DG F/E views: No results found for this or any previous visit. Cervical DG 2-3 clearing views: No results found for this or any previous visit. Cervical DG Bending/F/E views: No results found for this or any previous visit. Cervical DG complete: No results found for this or any previous visit. Cervical DG Myelogram views: No results found for this or any previous visit. Cervical DG Myelogram views: No results found for this or any previous visit. Cervical Discogram views: No results found for this or any previous visit.  Shoulder Imaging: Shoulder-R MR w contrast: No results found for this or any previous visit. Shoulder-L MR w contrast: No results found for this or any previous visit. Shoulder-R MR w/wo contrast: No results found for this or any previous visit. Shoulder-L MR w/wo contrast: No results found for this or any previous visit. Shoulder-R MR wo contrast: No results found for this or any previous visit. Shoulder-L MR wo contrast: No results found for this or any previous visit. Shoulder-R CT w contrast: No results found for this  or any previous visit. Shoulder-L CT w contrast: No results found for this or any previous visit. Shoulder-R CT w/wo contrast: No results found for this or any previous visit. Shoulder-L CT w/wo contrast: No results found for this or any previous visit. Shoulder-R CT wo contrast: No results found for this or any previous visit. Shoulder-L CT wo contrast: No results found for this or any previous visit. Shoulder-R DG Arthrogram: No results found for this or any previous visit. Shoulder-L DG Arthrogram: No results found for this or any previous visit. Shoulder-R DG 1 view: No results found for this or any previous visit. Shoulder-L DG 1 view: No results found for this or any previous visit. Shoulder-R DG: No results found for this or any previous visit. Shoulder-L DG: No results found for this or any previous visit.  Thoracic  Imaging: Thoracic MR wo contrast: No results found for this or any previous visit. Thoracic MR wo contrast: No procedure found. Thoracic MR w/wo contrast: No results found for this or any previous visit. Thoracic MR w contrast: No results found for this or any previous visit. Thoracic CT wo contrast: No results found for this or any previous visit. Thoracic CT w/wo contrast: No results found for this or any previous visit. Thoracic CT w/wo contrast: No results found for this or any previous visit. Thoracic CT w contrast: No results found for this or any previous visit. Thoracic DG 2-3 views: No results found for this or any previous visit. Thoracic DG 4 views: No results found for this or any previous visit. Thoracic DG: No results found for this or any previous visit. Thoracic DG w/swimmers view: No results found for this or any previous visit. Thoracic DG Myelogram views: No results found for this or any previous visit. Thoracic DG Myelogram views: No results found for this or any previous visit.  Lumbosacral Imaging: Lumbar MR wo contrast: No results found for  this or any previous visit. Lumbar MR wo contrast: No procedure found. Lumbar MR w/wo contrast: No results found for this or any previous visit. Lumbar MR w/wo contrast: No results found for this or any previous visit. Lumbar MR w contrast: No results found for this or any previous visit. Lumbar CT wo contrast: No results found for this or any previous visit. Lumbar CT w/wo contrast: No results found for this or any previous visit. Lumbar CT w/wo contrast: No results found for this or any previous visit. Lumbar CT w contrast: No results found for this or any previous visit. Lumbar DG 1V: No results found for this or any previous visit. Lumbar DG 1V (Clearing): No results found for this or any previous visit. Lumbar DG 2-3V (Clearing): No results found for this or any previous visit. Lumbar DG 2-3 views: No results found for this or any previous visit. Lumbar DG (Complete) 4+V: No results found for this or any previous visit.       Lumbar DG F/E views: No results found for this or any previous visit.       Lumbar DG Bending views: No results found for this or any previous visit.       Lumbar DG Myelogram views: No results found for this or any previous visit. Lumbar DG Myelogram: No results found for this or any previous visit. Lumbar DG Myelogram: No results found for this or any previous visit. Lumbar DG Myelogram: No results found for this or any previous visit. Lumbar DG Myelogram Lumbosacral: No results found for this or any previous visit. Lumbar DG Diskogram views: No results found for this or any previous visit. Lumbar DG Diskogram views: No results found for this or any previous visit. Lumbar DG Epidurogram OP: No results found for this or any previous visit. Lumbar DG Epidurogram IP: No results found for this or any previous visit.  Sacroiliac Joint Imaging: Sacroiliac Joint DG: No results found for this or any previous visit. Sacroiliac Joint MR w/wo contrast: No results found  for this or any previous visit. Sacroiliac Joint MR wo contrast: No results found for this or any previous visit.  Spine Imaging: Whole Spine DG Myelogram views: No results found for this or any previous visit. Whole Spine MR Mets screen: No results found for this or any previous visit. Whole Spine MR Mets screen: No results found for this or any  previous visit. Whole Spine MR w/wo: No results found for this or any previous visit. MRA Spinal Canal w/ cm: No results found for this or any previous visit. MRA Spinal Canal wo/ cm: No procedure found. MRA Spinal Canal w/wo cm: No results found for this or any previous visit. Spine Outside MR Films: No results found for this or any previous visit. Spine Outside CT Films: No results found for this or any previous visit. CT-Guided Biopsy: No results found for this or any previous visit. CT-Guided Needle Placement: No results found for this or any previous visit. DG Spine outside: No results found for this or any previous visit. IR Spine outside: No results found for this or any previous visit. NM Spine outside: No results found for this or any previous visit.  Hip Imaging: Hip-R MR w contrast: No results found for this or any previous visit. Hip-L MR w contrast: No results found for this or any previous visit. Hip-R MR w/wo contrast: No results found for this or any previous visit. Hip-L MR w/wo contrast: No results found for this or any previous visit. Hip-R MR wo contrast: No results found for this or any previous visit. Hip-L MR wo contrast: No results found for this or any previous visit. Hip-R CT w contrast: No results found for this or any previous visit. Hip-L CT w contrast: No results found for this or any previous visit. Hip-R CT w/wo contrast: No results found for this or any previous visit. Hip-L CT w/wo contrast: No results found for this or any previous visit. Hip-R CT wo contrast: No results found for this or any previous  visit. Hip-L CT wo contrast: No results found for this or any previous visit. Hip-R DG 2-3 views:  Results for orders placed in visit on 04/08/18  DG Hip Unilat W OR W/O Pelvis 2-3 Views Right   Narrative CLINICAL DATA:  Right hip pain after injury.  EXAM: DG HIP (WITH OR WITHOUT PELVIS) 2-3V RIGHT  COMPARISON:  None.  FINDINGS: There is no evidence of hip fracture or dislocation. There is no evidence of arthropathy or other focal bone abnormality.  IMPRESSION: No significant abnormality seen in the right hip.   Electronically Signed   By: Marijo Conception, M.D.   On: 04/08/2018 15:23    Hip-L DG 2-3 views:  Results for orders placed during the hospital encounter of 11/30/17  DG Hip Unilat W or Wo Pelvis 2-3 Views Left   Narrative CLINICAL DATA:  Pain after fall  EXAM: DG HIP (WITH OR WITHOUT PELVIS) 2-3V LEFT  COMPARISON:  None.  FINDINGS: There is a comminuted fracture through the left femoral neck, extending through the intertrochanteric region with angulation. No dislocation. 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 No other fractures identified.  IMPRESSION: Left proximal femoral fracture as above.   Electronically Signed   By: Dorise Bullion III M.D   On: 11/30/2017 14:00    Hip-R DG Arthrogram: No results found for this or any previous visit. Hip-L DG Arthrogram: No results found for this or any previous visit. Hip-B DG Bilateral: No results found for this or any previous visit.  Knee Imaging: Knee-R MR w contrast: No results found for this or any previous visit. Knee-L MR w contrast: No results found for this or any previous visit. Knee-R MR w/wo contrast: No results found for this or any previous visit. Knee-L MR w/wo contrast: No results found for this or any previous visit. Knee-R MR wo  contrast: No results found for this or any previous visit. Knee-L MR wo contrast: No results found for this or any previous visit. Knee-R CT w contrast: No results  found for this or any previous visit. Knee-L CT w contrast: No results found for this or any previous visit. Knee-R CT w/wo contrast: No results found for this or any previous visit. Knee-L CT w/wo contrast: No results found for this or any previous visit. Knee-R CT wo contrast: No results found for this or any previous visit. Knee-L CT wo contrast: No results found for this or any previous visit. Knee-R DG 1-2 views: No results found for this or any previous visit. Knee-L DG 1-2 views: No results found for this or any previous visit. Knee-R DG 3 views: No results found for this or any previous visit. Knee-L DG 3 views: No results found for this or any previous visit. Knee-R DG 4 views: No results found for this or any previous visit. Knee-L DG 4 views: No results found for this or any previous visit. Knee-R DG Arthrogram: No results found for this or any previous visit. Knee-L DG Arthrogram: No results found for this or any previous visit.  Ankle Imaging: Ankle-R DG Complete: No results found for this or any previous visit. Ankle-L DG Complete: No results found for this or any previous visit.  Foot Imaging: Foot-R DG Complete: No results found for this or any previous visit. Foot-L DG Complete: No results found for this or any previous visit.  Elbow Imaging: Elbow-R DG Complete: No results found for this or any previous visit. Elbow-L DG Complete: No results found for this or any previous visit.  Wrist Imaging: Wrist-R DG Complete: No results found for this or any previous visit. Wrist-L DG Complete: No results found for this or any previous visit.  Hand Imaging: Hand-R DG Complete: No results found for this or any previous visit. Hand-L DG Complete: No results found for this or any previous visit.  Complexity Note: Imaging results reviewed. Results shared with Mr. Word, using Layman's terms.                         ROS  Cardiovascular: No reported cardiovascular signs or  symptoms such as High blood pressure, coronary artery disease, abnormal heart rate or rhythm, heart attack, blood thinner therapy or heart weakness and/or failure Pulmonary or Respiratory: Wheezing and difficulty taking a deep full breath (Asthma) Neurological: No reported neurological signs or symptoms such as seizures, abnormal skin sensations, urinary and/or fecal incontinence, being born with an abnormal open spine and/or a tethered spinal cord Review of Past Neurological Studies:  Results for orders placed or performed during the hospital encounter of 05/02/18  CT Head Wo Contrast   Narrative   CLINICAL DATA:  Patient found lying on floor known feces. Patient is hypothermic from head to toe. No obvious trauma.  EXAM: CT HEAD WITHOUT CONTRAST  TECHNIQUE: Contiguous axial images were obtained from the base of the skull through the vertex without intravenous contrast.  COMPARISON:  November 30, 2016  FINDINGS: Brain: No subdural, epidural, or subarachnoid hemorrhage. Brainstem, basal cisterns, and cerebellum are unchanged. Probable small lacunar infarct just to the left of midline in the cerebellum on axial image 9, unchanged since November 2011. Ventricles and sulci are prominent but stable. Chronic white matter changes are again identified. A small lacunar infarct in the posterior limb of the left internal capsule on axial image 19 is slightly  more conspicuous in the interval. No acute cortical ischemia or infarct noted. No mass effect or midline shift.  Vascular: No hyperdense vessel or unexpected calcification.  Skull: Normal. Negative for fracture or focal lesion.  Sinuses/Orbits: Mucosal thickening in the right maxillary sinus. Paranasal sinuses otherwise normal. The right mastoid air cells and right middle ear are well aerated. Patient is status post left mastoidectomy with chronically opacified middle ear.  Other: None.  IMPRESSION: 1. A lacunar infarct in the  posterior limb of the left internal capsule is more conspicuous in the interval. The difference could be due to difference in slice selection. No other acute intracranial abnormalities.   Electronically Signed   By: Dorise Bullion III M.D   On: 05/02/2018 08:36    Psychological-Psychiatric: No reported psychological or psychiatric signs or symptoms such as difficulty sleeping, anxiety, depression, delusions or hallucinations (schizophrenial), mood swings (bipolar disorders) or suicidal ideations or attempts Gastrointestinal: Vomiting blood (Ulcers) Genitourinary: No reported renal or genitourinary signs or symptoms such as difficulty voiding or producing urine, peeing blood, non-functioning kidney, kidney stones, difficulty emptying the bladder, difficulty controlling the flow of urine, or chronic kidney disease Hematological: No reported hematological signs or symptoms such as prolonged bleeding, low or poor functioning platelets, bruising or bleeding easily, hereditary bleeding problems, low energy levels due to low hemoglobin or being anemic Endocrine: No reported endocrine signs or symptoms such as high or low blood sugar, rapid heart rate due to high thyroid levels, obesity or weight gain due to slow thyroid or thyroid disease Rheumatologic: Constant unexplained fatigue (Chronic Fatigue Syndrome) Musculoskeletal: Negative for myasthenia gravis, muscular dystrophy, multiple sclerosis or malignant hyperthermia Work History: Retired  Allergies  Mr. Whitsell is allergic to aspirin.  Laboratory Chemistry   SAFETY SCREENING Profile Lab Results  Component Value Date   SARSCOV2NAA NEGATIVE 05/28/2018   STAPHAUREUS POSITIVE (A) 11/30/2017   MRSAPCR NEGATIVE 05/02/2018   Inflammation Markers (CRP: Acute Phase) (ESR: Chronic Phase) Lab Results  Component Value Date   LATICACIDVEN 1.3 05/03/2018                         Rheumatology Markers No results found for: RF, ANA, LABURIC,  URICUR, LYMEIGGIGMAB, LYMEABIGMQN, HLAB27                      Renal Function Markers Lab Results  Component Value Date   BUN 22 06/15/2018   CREATININE 2.17 (H) 06/15/2018   LABCREA 126 10/28/2016   BCR 13 02/13/2018   GFRAA 32 (L) 06/15/2018   GFRNONAA 27 (L) 06/15/2018                             Hepatic Function Markers Lab Results  Component Value Date   AST 15 05/28/2018   ALT 10 05/28/2018   ALBUMIN 2.4 (L) 05/28/2018   ALKPHOS 93 05/28/2018   LIPASE 42 05/28/2018                        Electrolytes Lab Results  Component Value Date   NA 138 06/15/2018   K 3.6 06/15/2018   CL 108 06/15/2018   CALCIUM 8.6 (L) 06/15/2018   MG 2.2 05/18/2018   PHOS 3.1 05/18/2018                        Neuropathy Markers Lab Results  Component Value Date   VITAMINB12 197 (L) 07/23/2016   FOLATE 4.6 (L) 07/23/2016   HGBA1C 5.7 07/23/2016                        CNS Tests No results found for: COLORCSF, APPEARCSF, RBCCOUNTCSF, WBCCSF, POLYSCSF, LYMPHSCSF, EOSCSF, PROTEINCSF, GLUCCSF, JCVIRUS, CSFOLI, IGGCSF, LABACHR, ACETBL                      Bone Pathology Markers No results found for: VD25OH, KG881JS3PRX, YV8592TW4, MQ2863OT7, 25OHVITD1, 25OHVITD2, 25OHVITD3, TESTOFREE, TESTOSTERONE                       Coagulation Parameters Lab Results  Component Value Date   INR 1.1 05/08/2018   LABPROT 14.0 05/08/2018   APTT 25 05/08/2018   PLT 253 06/08/2018                        Cardiovascular Markers Lab Results  Component Value Date   BNP 457.0 (H) 11/19/2017   CKTOTAL 92 05/02/2018   TROPONINI <0.03 05/29/2018   HGB 7.8 (L) 06/15/2018   HCT 26.7 (L) 06/08/2018                         ID Test(s) Lab Results  Component Value Date   SARSCOV2NAA NEGATIVE 05/28/2018   STAPHAUREUS POSITIVE (A) 11/30/2017   MRSAPCR NEGATIVE 05/02/2018    CA Markers No results found for: CEA, CA125, LABCA2                      Endocrine Markers Lab Results  Component Value Date    TSH 3.10 11/14/2017                        Note: Lab results reviewed.  PFSH  Drug: Mr. Goodwine  reports no history of drug use. Alcohol:  reports no history of alcohol use. Tobacco:  reports that he has been smoking cigarettes. He has a 70.00 pack-year smoking history. He has never used smokeless tobacco. Medical:  has a past medical history of C. difficile diarrhea (11/2017), Elevated serum creatinine, History of melanoma, Hypertension, Osteoarthritis, and Tobacco abuse. Family: family history is not on file.  Past Surgical History:  Procedure Laterality Date  . ANKLE FRACTURE SURGERY    . CENTRAL VENOUS CATHETER INSERTION  05/08/2018   Procedure: INSERTION CENTRAL LINE ADULT;  Surgeon: Olean Ree, MD;  Location: [facility];  Service: General;;  . ESOPHAGOGASTRODUODENOSCOPY  05/08/2018   Procedure: ESOPHAGOGASTRODUODENOSCOPY (EGD);  Surgeon: Olean Ree, MD;  Location: [facility];  Service: General;;  . ESOPHAGOGASTRODUODENOSCOPY (EGD) WITH PROPOFOL N/A 05/03/2018   Procedure: ESOPHAGOGASTRODUODENOSCOPY (EGD) WITH PROPOFOL;  Surgeon: Toledo, Benay Pike, MD;  Location: [facility];  Service: Gastroenterology;  Laterality: N/A;  . EXTERNAL EAR SURGERY    . INTRAMEDULLARY (IM) NAIL INTERTROCHANTERIC Left 12/01/2017   Procedure: INTRAMEDULLARY (IM) NAIL INTERTROCHANTRIC;  Surgeon: Thornton Park, MD;  Location: [facility];  Service: Orthopedics;  Laterality: Left;  . JEJUNOSTOMY  05/08/2018   Procedure: JEJUNOSTOMY;  Surgeon: Olean Ree, MD;  Location: [facility];  Service: General;;  . LAPAROTOMY N/A 05/08/2018   Procedure: EXPLORATORY LAPAROTOMY;  Surgeon: Olean Ree, MD;  Location: [facility];  Service: General;  Laterality: N/A;  . ROTATOR CUFF REPAIR    . TONSILLECTOMY    . TYMPANOPLASTY W/ MASTOIDECTOMY  Patient with reports of mastoid surgery (appears to have had surgery on TM; Left).  . VISCERAL ARTERY INTERVENTION N/A 05/04/2018   Procedure: VISCERAL ARTERY INTERVENTION;   Surgeon: Algernon Huxley, MD;  Location: [facility];  Service: Cardiovascular;  Laterality: N/A;   Active Ambulatory Problems    Diagnosis Date Noted  . Hypertension   . Osteoarthritis   . Tobacco abuse   . Sleep disturbance 07/23/2016  . CKD (chronic kidney disease) stage 3, GFR 30-59 ml/min (HCC) 08/05/2016  . Anemia 08/05/2016  . Anxiety and depression 08/05/2016  . Weight loss 08/05/2016  . Prostate nodule 10/16/2016  . Dysuria 10/16/2016  . Simple renal cyst 10/21/2016  . Chronic right upper quadrant pain 11/15/2016  . Balance problem 11/15/2016  . 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 DOE (dyspnea on exertion) 01/17/2017  . Basal cell carcinoma 02/28/2017  . 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 Cough 09/25/2017  . Changes in vision 09/25/2017  . Proteinuria 09/25/2017  . Cigarette smoker 10/06/2017  . Frequency of urination 10/21/2017  . Diarrhea 11/15/2017  . Duodenum disorder 11/15/2017  . Closed left hip fracture ([place]) 11/30/2017  . Protein-calorie malnutrition, severe 12/03/2017  . Suicidal ideation 03/19/2018  . Chronic hip pain 04/08/2018  . RLS (restless legs syndrome) 04/22/2018  . Wrist pain 04/22/2018  . GI bleed 05/02/2018  . Hemorrhagic shock ([place]) 05/02/2018  . Altered mental status 05/02/2018  . Anemia due to blood loss, acute 05/02/2018  . Abdominal pain 05/28/2018  . Insufficient social support 06/27/2018   Resolved Ambulatory Problems    Diagnosis Date Noted  . History of melanoma   . Hearing loss 07/01/2016  . Lower extremity edema 07/23/2016  . Bronchitis 05/05/2017  . Otitis media 03/19/2018   Past Medical History:  Diagnosis Date  . C. difficile diarrhea 11/2017  . Elevated serum creatinine    Constitutional Exam  General appearance: Well nourished, well developed, and well hydrated. In no apparent acute distress There were no vitals filed for this visit. BMI Assessment: Estimated body mass index is 17.87  kg/m as calculated from the following:   Height as of 06/26/18: '5\' 9"'  (1.753 m).   Weight as of 06/26/18: 121 lb (54.9 kg).  BMI interpretation table: BMI level Category Range association with higher incidence of chronic pain  <18 kg/m2 Underweight   18.5-24.9 kg/m2 Ideal body weight   25-29.9 kg/m2 Overweight Increased incidence by 20%  30-34.9 kg/m2 Obese (Class I) Increased incidence by 68%  35-39.9 kg/m2 Severe obesity (Class II) Increased incidence by 136%  >40 kg/m2 Extreme obesity (Class III) Increased incidence by 254%   Patient's current BMI Ideal Body weight  There is no height or weight on file to calculate BMI. Ideal body weight: 70.7 kg (155 lb 13.8 oz)   BMI Readings from Last 4 Encounters:  06/26/18 17.87 kg/m  05/29/18 18.40 kg/m  05/16/18 22.45 kg/m  04/22/18 20.01 kg/m   Wt Readings from Last 4 Encounters:  06/26/18 121 lb (54.9 kg)  05/29/18 121 lb (54.9 kg)  04/22/18 131 lb 9.6 oz (59.7 kg)  03/18/18 130 lb 9.6 oz (59.2 kg)  Psych/Mental status: Alert, oriented x 3 (person, place, & time)       Eyes: PERLA Respiratory: No evidence of acute respiratory distress  Cervical Spine Area Exam  Skin & Axial Inspection: No masses, redness, edema, swelling, or associated skin lesions Alignment: Symmetrical Functional ROM: Unrestricted ROM  Stability: No instability detected Muscle Tone/Strength: Functionally intact. No obvious neuro-muscular anomalies detected. Sensory (Neurological): Unimpaired Palpation: No palpable anomalies              Upper Extremity (UE) Exam    Side: Right upper extremity  Side: Left upper extremity  Skin & Extremity Inspection: Skin color, temperature, and hair growth are WNL. No peripheral edema or cyanosis. No masses, redness, swelling, asymmetry, or associated skin lesions. No contractures.  Skin & Extremity Inspection: Skin color, temperature, and hair growth are WNL. No peripheral edema or cyanosis. No masses, redness,  swelling, asymmetry, or associated skin lesions. No contractures.  Functional ROM: Unrestricted ROM          Functional ROM: Unrestricted ROM          Muscle Tone/Strength: Functionally intact. No obvious neuro-muscular anomalies detected.  Muscle Tone/Strength: Functionally intact. No obvious neuro-muscular anomalies detected.  Sensory (Neurological): Unimpaired          Sensory (Neurological): Unimpaired          Palpation: No palpable anomalies              Palpation: No palpable anomalies              Provocative Test(s):  Phalen's test: deferred Tinel's test: deferred Apley's scratch test (touch opposite shoulder):  Action 1 (Across chest): deferred Action 2 (Overhead): deferred Action 3 (LB reach): deferred   Provocative Test(s):  Phalen's test: deferred Tinel's test: deferred Apley's scratch test (touch opposite shoulder):  Action 1 (Across chest): deferred Action 2 (Overhead): deferred Action 3 (LB reach): deferred    Thoracic Spine Area Exam  Skin & Axial Inspection: No masses, redness, or swelling Alignment: Symmetrical Functional ROM: Unrestricted ROM Stability: No instability detected Muscle Tone/Strength: Functionally intact. No obvious neuro-muscular anomalies detected. Sensory (Neurological): Unimpaired Muscle strength & Tone: No palpable anomalies  Lumbar Spine Area Exam  Skin & Axial Inspection: No masses, redness, or swelling Alignment: Symmetrical Functional ROM: Unrestricted ROM       Stability: No instability detected Muscle Tone/Strength: Functionally intact. No obvious neuro-muscular anomalies detected. Sensory (Neurological): Unimpaired Palpation: No palpable anomalies       Provocative Tests: Hyperextension/rotation test: deferred today       Lumbar quadrant test (Kemp's test): deferred today       Lateral bending test: deferred today       Patrick's Maneuver: deferred today                   FABER* test: deferred today                   S-I  anterior distraction/compression test: deferred today         S-I lateral compression test: deferred today         S-I Thigh-thrust test: deferred today         S-I Gaenslen's test: deferred today         *(Flexion, ABduction and External Rotation)  Gait & Posture Assessment  Ambulation: Unassisted Gait: Relatively normal for age and body habitus Posture: WNL   Lower Extremity Exam    Side: Right lower extremity  Side: Left lower extremity  Stability: No instability observed          Stability: No instability observed          Skin & Extremity Inspection: Skin color, temperature, and hair growth are WNL. No peripheral edema or cyanosis. No masses, redness, swelling, asymmetry, or associated  skin lesions. No contractures.  Skin & Extremity Inspection: Skin color, temperature, and hair growth are WNL. No peripheral edema or cyanosis. No masses, redness, swelling, asymmetry, or associated skin lesions. No contractures.  Functional ROM: Unrestricted ROM                  Functional ROM: Unrestricted ROM                  Muscle Tone/Strength: Functionally intact. No obvious neuro-muscular anomalies detected.  Muscle Tone/Strength: Functionally intact. No obvious neuro-muscular anomalies detected.  Sensory (Neurological): Unimpaired        Sensory (Neurological): Unimpaired        DTR: Patellar: deferred today Achilles: deferred today Plantar: deferred today  DTR: Patellar: deferred today Achilles: deferred today Plantar: deferred today  Palpation: No palpable anomalies  Palpation: No palpable anomalies   Assessment  Primary Diagnosis & Pertinent Problem List: There were no encounter diagnoses.  Visit Diagnosis (New problems to examiner): No diagnosis found. Plan of Care (Initial workup plan)  Note: Mr. Veltre was reminded that as per protocol, today's visit has been an evaluation only. We have not taken over the patient's controlled substance management.  Problem-specific plan: No  problem-specific Assessment & Plan notes found for this encounter.  Lab Orders  No laboratory test(s) ordered today   Imaging Orders  No imaging studies ordered today   Referral Orders  No referral(s) requested today   Procedure Orders    No procedure(s) ordered today   Pharmacotherapy (current): Medications ordered:  No orders of the defined types were placed in this encounter.  Medications administered during this visit: Sherrilyn Rist had no medications administered during this visit.   Pharmacological management options:  Opioid Analgesics: The patient was informed that there is no guarantee that he would be a candidate for opioid analgesics. The decision will be made following CDC guidelines. This decision will be based on the results of diagnostic studies, as well as Mr. Texeira's risk profile.   Membrane stabilizer: To be determined at a later time  Muscle relaxant: To be determined at a later time  NSAID: To be determined at a later time  Other analgesic(s): To be determined at a later time   Interventional management options: Mr. Feild was informed that there is no guarantee that he would be a candidate for interventional therapies. The decision will be based on the results of diagnostic studies, as well as Mr. Ojala's risk profile.  Procedure(s) under consideration:  ***   Provider-requested follow-up: No follow-ups on file.  Future Appointments  Date Time Provider [place]  07/07/2018  2:00 PM Gillis Santa, MD [facility] None  07/17/2018  8:30 AM O'Brien-Blaney, Bryson Corona, LPN [facility]  02/19/2480  9:00 AM Leone Haven, MD [facility]    Primary Care Physician: Leone Haven, MD Location: [facility] Note by: Gillis Santa, MD Date: 07/07/2018; Time: 3:15 PM  Note: This dictation was prepared with Dragon dictation. Any transcriptional errors that may result from this process are unintentional.

## 2018-07-06 NOTE — Telephone Encounter (Signed)
Noted. We will have to see what comes of his visit with pain management tomorrow. I have placed another referral to orthopedics.

## 2018-07-06 NOTE — Telephone Encounter (Signed)
Called and spoke to patient.  Patient said that he has an appointment scheduled for tomorrow (07/06/18) at a pain management clinic at 2:00 pm.  Patient said that he still has not heard from anyone concerning the referral to orthopedics to schedule that appt.    Patient said that someone at the group home gives him his medications.  Patient said that he feels he doesn't need anyone to give him his medications and would like to give himself his own meds.  Patient gave verbal permission for this office to speak to someone at the group home concerning distribution of his medications.  The name of the group home is [facility].  Patient gave contact name of owner as Mr. Loney Loh who can be reached at [phone].

## 2018-07-07 ENCOUNTER — Ambulatory Visit: Payer: Medicare HMO | Admitting: Student in an Organized Health Care Education/Training Program

## 2018-07-07 NOTE — Telephone Encounter (Signed)
He is supposed to see pain management today. I would like to discontinue this medication as pain management should be evaluating him today and helping determine an appropriate treatment regimen.

## 2018-07-09 ENCOUNTER — Telehealth: Payer: Self-pay

## 2018-07-09 NOTE — Telephone Encounter (Signed)
This can be discontinued. Please create a written order on a prescription and place in my sign folder. Thanks.

## 2018-07-09 NOTE — Telephone Encounter (Signed)
Copied from [place] [phone]. Topic: General - Other >> Jul 09, 2018 11:37 AM Leward Quan A wrote: Reason for CRM: Patient called said that he was told to call and he was not sure why. I checked his chart did not see any notes but he states that he can be reached at Ph# [phone]

## 2018-07-09 NOTE — Telephone Encounter (Signed)
I did not call this patient.  Nina,cma

## 2018-07-13 ENCOUNTER — Other Ambulatory Visit: Payer: Self-pay | Admitting: Family Medicine

## 2018-07-14 ENCOUNTER — Other Ambulatory Visit: Payer: Self-pay

## 2018-07-14 MED ORDER — ALPRAZOLAM 0.25 MG PO TABS: 0.2500 mg | ORAL_TABLET | Freq: Two times a day (BID) | ORAL | 0 refills | Status: DC | PRN

## 2018-07-14 NOTE — Telephone Encounter (Signed)
All medication were refilled 3 weeks ago by a different provider. Is it okay to refill?

## 2018-07-16 ENCOUNTER — Other Ambulatory Visit: Payer: Self-pay | Admitting: Family Medicine

## 2018-07-16 ENCOUNTER — Telehealth: Payer: Self-pay | Admitting: Family Medicine

## 2018-07-16 MED ORDER — CALCIUM CARBONATE-VITAMIN D 500-200 MG-UNIT PO TABS: 1.0000 | ORAL_TABLET | Freq: Two times a day (BID) | ORAL | 1 refills | Status: DC

## 2018-07-16 MED ORDER — THIAMINE HCL 100 MG PO TABS: 100.0000 mg | ORAL_TABLET | Freq: Every day | ORAL | 1 refills | Status: DC

## 2018-07-16 NOTE — Telephone Encounter (Signed)
Sent to pharmacy 

## 2018-07-16 NOTE — Telephone Encounter (Signed)
This medication was prescribed by a historical provider, Dr. Loletha Grayer.    Last OV with you:  06/26/18  Last Refill:  06/17/18

## 2018-07-16 NOTE — Telephone Encounter (Signed)
This medication was sent to pharmacy on 7.7.20 but the class says Print so it may have not went through. Pharmacy states they did not receive. Please resend refill for  ALPRAZolam (XANAX) 0.25 MG tablet

## 2018-07-16 NOTE — Telephone Encounter (Signed)
Medication Refill - Medication: calcium-vitamin D (OSCAL WITH D) 500-200 MG-UNIT tablet, Vitamin B (Pharmacy called and stated they need order sent over for these medications or a phone call authorizing.)   Has the patient contacted their pharmacy? Yes (Agent: If no, request that the patient contact the pharmacy for the refill.) (Agent: If yes, when and what did the pharmacy advise?)Contact PCP  Preferred Pharmacy (with phone number or street name):  [facility], [place] [phone] (Phone) [phone] (Fax)     Agent: Please be advised that RX refills may take up to 3 business days. We ask that you follow-up with your pharmacy.

## 2018-07-16 NOTE — Telephone Encounter (Signed)
Patient aware.

## 2018-07-17 ENCOUNTER — Ambulatory Visit (INDEPENDENT_AMBULATORY_CARE_PROVIDER_SITE_OTHER): Payer: Medicare HMO | Admitting: Family Medicine

## 2018-07-17 ENCOUNTER — Other Ambulatory Visit: Payer: Self-pay

## 2018-07-17 ENCOUNTER — Ambulatory Visit (INDEPENDENT_AMBULATORY_CARE_PROVIDER_SITE_OTHER): Payer: Medicare HMO

## 2018-07-17 ENCOUNTER — Encounter: Payer: Self-pay | Admitting: Family Medicine

## 2018-07-17 DIAGNOSIS — M150 Primary generalized (osteo)arthritis: Secondary | ICD-10-CM

## 2018-07-17 DIAGNOSIS — D649 Anemia, unspecified: Secondary | ICD-10-CM

## 2018-07-17 DIAGNOSIS — E43 Unspecified severe protein-calorie malnutrition: Secondary | ICD-10-CM

## 2018-07-17 DIAGNOSIS — Z8619 Personal history of other infectious and parasitic diseases: Secondary | ICD-10-CM | POA: Insufficient documentation

## 2018-07-17 DIAGNOSIS — C4322 Malignant melanoma of left ear and external auricular canal: Secondary | ICD-10-CM | POA: Diagnosis not present

## 2018-07-17 DIAGNOSIS — M8949 Other hypertrophic osteoarthropathy, multiple sites: Secondary | ICD-10-CM

## 2018-07-17 DIAGNOSIS — G2581 Restless legs syndrome: Secondary | ICD-10-CM | POA: Diagnosis not present

## 2018-07-17 DIAGNOSIS — F329 Major depressive disorder, single episode, unspecified: Secondary | ICD-10-CM

## 2018-07-17 DIAGNOSIS — Z0000 Encounter for general adult medical examination without abnormal findings: Secondary | ICD-10-CM

## 2018-07-17 DIAGNOSIS — R69 Illness, unspecified: Secondary | ICD-10-CM | POA: Diagnosis not present

## 2018-07-17 DIAGNOSIS — M159 Polyosteoarthritis, unspecified: Secondary | ICD-10-CM

## 2018-07-17 DIAGNOSIS — F419 Anxiety disorder, unspecified: Secondary | ICD-10-CM

## 2018-07-17 DIAGNOSIS — F32A Depression, unspecified: Secondary | ICD-10-CM

## 2018-07-17 MED ORDER — CARNATION BREAKFAST ESSENTIALS PO LIQD: 1.0000 | Freq: Two times a day (BID) | ORAL | 1 refills | Status: DC

## 2018-07-17 NOTE — Patient Instructions (Addendum)
  Justin Andrews , Thank you for taking time to come for your Medicare Wellness Visit. I appreciate your ongoing commitment to your health goals. Please review the following plan we discussed and let me know if I can assist you in the future.   These are the goals we discussed: Goals    . Healthy Lifestyle     Walk for exercise Eat a healthy diet Stay hydrated        This is a list of the screening recommended for you and due dates:  Health Maintenance  Topic Date Due  . Pneumonia vaccines (1 of 2 - PCV13) 01/23/2001  . Flu Shot  08/08/2018  . Tetanus Vaccine  12/08/2025

## 2018-07-17 NOTE — Assessment & Plan Note (Signed)
Refer to psychiatry.  We will check with the clinical pharmacist regarding Remeron or trazodone.

## 2018-07-17 NOTE — Assessment & Plan Note (Signed)
He will come in for lab work.

## 2018-07-17 NOTE — Progress Notes (Signed)
I have reviewed the above note and agree.  Wael Maestas, M.D.  

## 2018-07-17 NOTE — Assessment & Plan Note (Addendum)
Patient was re-referred to dermatology after multiple prior referrals when he did not show up for appointments.  I sent a message to our referral coordinator to check in on this referral.  The patient notes he can arrange for transport.  I discussed the importance of him following up with dermatology.

## 2018-07-17 NOTE — Assessment & Plan Note (Signed)
Reinforced that he needs to use protein shakes supplements.

## 2018-07-17 NOTE — Progress Notes (Signed)
Virtual Visit via telephone Note  This visit type was conducted due to national recommendations for restrictions regarding the COVID-19 pandemic (e.g. social distancing).  This format is felt to be most appropriate for this patient at this time.  All issues noted in this document were discussed and addressed.  No physical exam was performed (except for noted visual exam findings with Video Visits).   I connected with Justin Andrews today at  9:00 AM EDT by telephone and verified that I am speaking with the correct person using two identifiers. Location patient: home Location provider: work  Persons participating in the vrtual visit: patient, provider  I discussed the limitations, risks, security and privacy concerns of performing an evaluation and management service by telephone and the availability of in person appointments. I also discussed with the patient that there may be a patient responsible charge related to this service. The patient expressed understanding and agreed to proceed.  Interactive audio and video telecommunications were attempted between this provider and patient, however failed, due to patient having technical difficulties OR patient did not have access to video capability.  We continued and completed visit with audio only.   Reason for visit: follow-up  HPI: anxiety/depression: Patient notes his anxiety and depression has gotten worse.  He has trouble shutting his mind off at night.  The depression is worse with the environment he is living in a group home.  He is on Remeron and trazodone.  No SI.  He has not been able to see his psychiatrist as an outpatient.  Restless leg syndrome: Patient has had this for years.  It causes him to have trouble sleeping.  He tries to go to bed between 9 and 10 PM.  Takes 3 to 4 hours to fall asleep because he is moving his legs.  He will sleep for 2 to 3 hours and wake up.  No TV before bed.  He denies alcohol intake.  He has 2 cups of coffee  daily.  Melanoma: The patient has not heard from dermatology after being re-referred recently.  Feels as though the melanoma has spread on his face.  Chronic hip pain: He has an appointment with pain management next week.  He has not heard anything regarding his orthopedic appointment.  History of C. difficile: He notes no diarrhea.  He did have a CT scan of the abdomen and pelvis when he was in the hospital most recently and there were some postop changes and inflammation around the repair site and some bubbles of air around the liver dome.  This was evaluated by his surgeons and no surgical intervention was recommended.  They felt the free air bubbles were likely related to his JP drain being without a suction bulb.  Protein calorie malnutrition: He notes not having much of an appetite.  Notes the food is not good where he is staying.  He has not been doing protein shakes.   ROS: See pertinent positives and negatives per HPI.  Past Medical History:  Diagnosis Date  . C. difficile diarrhea 11/2017  . Elevated serum creatinine   . History of melanoma   . Hypertension   . Osteoarthritis   . Tobacco abuse     Past Surgical History:  Procedure Laterality Date  . ANKLE FRACTURE SURGERY    . CENTRAL VENOUS CATHETER INSERTION  05/08/2018   Procedure: INSERTION CENTRAL LINE ADULT;  Surgeon: Olean Ree, MD;  Location: [facility];  Service: General;;  . ESOPHAGOGASTRODUODENOSCOPY  05/08/2018  Procedure: ESOPHAGOGASTRODUODENOSCOPY (EGD);  Surgeon: Olean Ree, MD;  Location: [facility];  Service: General;;  . ESOPHAGOGASTRODUODENOSCOPY (EGD) WITH PROPOFOL N/A 05/03/2018   Procedure: ESOPHAGOGASTRODUODENOSCOPY (EGD) WITH PROPOFOL;  Surgeon: Toledo, Benay Pike, MD;  Location: [facility];  Service: Gastroenterology;  Laterality: N/A;  . EXTERNAL EAR SURGERY    . INTRAMEDULLARY (IM) NAIL INTERTROCHANTERIC Left 12/01/2017   Procedure: INTRAMEDULLARY (IM) NAIL INTERTROCHANTRIC;  Surgeon: Thornton Park, MD;  Location: [facility];  Service: Orthopedics;  Laterality: Left;  . JEJUNOSTOMY  05/08/2018   Procedure: JEJUNOSTOMY;  Surgeon: Olean Ree, MD;  Location: [facility];  Service: General;;  . LAPAROTOMY N/A 05/08/2018   Procedure: EXPLORATORY LAPAROTOMY;  Surgeon: Olean Ree, MD;  Location: [facility];  Service: General;  Laterality: N/A;  . ROTATOR CUFF REPAIR    . TONSILLECTOMY    . TYMPANOPLASTY W/ MASTOIDECTOMY     Patient with reports of mastoid surgery (appears to have had surgery on TM; Left).  . VISCERAL ARTERY INTERVENTION N/A 05/04/2018   Procedure: VISCERAL ARTERY INTERVENTION;  Surgeon: Algernon Huxley, MD;  Location: [facility];  Service: Cardiovascular;  Laterality: N/A;    Family History  Problem Relation Age of Onset  . Prostate cancer Neg Hx   . Bladder Cancer Neg Hx   . Kidney cancer Neg Hx     SOCIAL HX: smoker   Current Outpatient Medications:  .  ALPRAZolam (XANAX) 0.25 MG tablet, Take 1 tablet (0.25 mg total) by mouth 2 (two) times daily as needed for anxiety., Disp: 12 tablet, Rfl: 0 .  calcium-vitamin D (OSCAL WITH D) 500-200 MG-UNIT tablet, Take 1 tablet by mouth 2 (two) times daily., Disp: 60 tablet, Rfl: 1 .  FEROSUL 325 (65 Fe) MG tablet, TAKE 1 TABLET BY MOUTH ONCE DAILY FOR SUPPLEMENT, Disp: 30 tablet, Rfl: 0 .  folic acid (FOLVITE) 1 MG tablet, TAKE 1 TABLET BY MOUTH DAILY, Disp: 30 tablet, Rfl: 0 .  mirtazapine (REMERON) 30 MG tablet, Take 0.5 tablets (15 mg total) by mouth at bedtime., Disp: 30 tablet, Rfl: 0 .  Nutritional Supplements (CARNATION BREAKFAST ESSENTIALS) LIQD, Take 1 Bottle by mouth 2 (two) times daily between meals., Disp: 14220 mL, Rfl: 1 .  pantoprazole (PROTONIX) 40 MG tablet, TAKE 1 TABLET DAILY AT 7:00 AM ON AN EMPTY STOMACH, Disp: 30 tablet, Rfl: 0 .  tamsulosin (FLOMAX) 0.4 MG CAPS capsule, TAKE 1 CAPSULE BY MOUTH DAILY, Disp: 30 capsule, Rfl: 0 .  thiamine 100 MG tablet, Take 1 tablet (100 mg total) by mouth daily.,  Disp: 30 tablet, Rfl: 1 .  traZODone (DESYREL) 100 MG tablet, Take 1 tablet (100 mg total) by mouth at bedtime., Disp: 30 tablet, Rfl: 3  EXAM: This is a telehealth telephone visit and thus no physical exam was completed.  ASSESSMENT AND PLAN:  Discussed the following assessment and plan:  Malignant melanoma of left ear [facility]) Patient was re-referred to dermatology after multiple prior referrals when he did not show up for appointments.  I sent a message to our referral coordinator to check in on this referral.  The patient notes he can arrange for transport.  I discussed the importance of him following up with dermatology.  Osteoarthritis Chronic pain related osteoarthritis.  He has chronic hip pain as well.  I reinforced that he needs to keep the appointment with the pain specialist to discuss management of his pain at this point.  I will also have our referral coordinator follow-up with orthopedics to see if  they can get him scheduled.  Protein-calorie malnutrition, severe Reinforced that he needs to use protein shakes supplements.  Anxiety and depression Refer to psychiatry.  We will check with the clinical pharmacist regarding Remeron or trazodone.  History of Clostridioides difficile infection Asymptomatic.  Monitor for recurrence.  Anemia He will come in for lab work.  RLS (restless legs syndrome) Check ferritin.  We will check with our clinical pharmacist regarding Requip and whether or not this is okay with Remeron and trazodone.    I discussed the assessment and treatment plan with the patient. The patient was provided an opportunity to ask questions and all were answered. The patient agreed with the plan and demonstrated an understanding of the instructions.   The patient was advised to call back or seek an in-person evaluation if the symptoms worsen or if the condition fails to improve as anticipated.  I provided 17 minutes of non-face-to-face time during this  encounter.   [name], MD

## 2018-07-17 NOTE — Assessment & Plan Note (Signed)
Chronic pain related osteoarthritis.  He has chronic hip pain as well.  I reinforced that he needs to keep the appointment with the pain specialist to discuss management of his pain at this point.  I will also have our referral coordinator follow-up with orthopedics to see if they can get him scheduled.

## 2018-07-17 NOTE — Assessment & Plan Note (Signed)
Check ferritin.  We will check with our clinical pharmacist regarding Requip and whether or not this is okay with Remeron and trazodone.

## 2018-07-17 NOTE — Progress Notes (Signed)
Subjective:   Justin Andrews is a 84 y.o. male who presents for Medicare Annual/Subsequent preventive examination.  Review of Systems:  No ROS.  Medicare Wellness Virtual Visit.  Visual/audio telehealth visit, UTA vital signs.   See social history for additional risk factors.   Cardiac Risk Factors include: advanced age (>39men, >70 women);male gender;smoking/ tobacco exposure     Objective:    Vitals: There were no vitals taken for this visit.  There is no height or weight on file to calculate BMI.  Advanced Directives 05/29/2018 05/28/2018 05/03/2018 03/13/2018 11/30/2017 11/24/2017 11/19/2017  Does Patient Have a Medical Advance Directive? No No No No No No No  Would patient like information on creating a medical advance directive? No - Patient declined - - No - Patient declined No - Patient declined No - Patient declined -    Tobacco Social History   Tobacco Use  Smoking Status Current Every Day Smoker  . Packs/day: 1.00  . Years: 70.00  . Pack years: 70.00  . Types: Cigarettes  Smokeless Tobacco Never Used     Ready to quit: Not Answered Counseling given: Not Answered   Clinical Intake:                       Past Medical History:  Diagnosis Date  . C. difficile diarrhea 11/2017  . Elevated serum creatinine   . History of melanoma   . Hypertension   . Osteoarthritis   . Tobacco abuse    Past Surgical History:  Procedure Laterality Date  . ANKLE FRACTURE SURGERY    . CENTRAL VENOUS CATHETER INSERTION  05/08/2018   Procedure: INSERTION CENTRAL LINE ADULT;  Surgeon: Olean Ree, MD;  Location: [facility];  Service: General;;  . ESOPHAGOGASTRODUODENOSCOPY  05/08/2018   Procedure: ESOPHAGOGASTRODUODENOSCOPY (EGD);  Surgeon: Olean Ree, MD;  Location: [facility];  Service: General;;  . ESOPHAGOGASTRODUODENOSCOPY (EGD) WITH PROPOFOL N/A 05/03/2018   Procedure: ESOPHAGOGASTRODUODENOSCOPY (EGD) WITH PROPOFOL;  Surgeon: Toledo, Benay Pike, MD;  Location: [facility];  Service: Gastroenterology;  Laterality: N/A;  . EXTERNAL EAR SURGERY    . INTRAMEDULLARY (IM) NAIL INTERTROCHANTERIC Left 12/01/2017   Procedure: INTRAMEDULLARY (IM) NAIL INTERTROCHANTRIC;  Surgeon: Thornton Park, MD;  Location: [facility];  Service: Orthopedics;  Laterality: Left;  . JEJUNOSTOMY  05/08/2018   Procedure: JEJUNOSTOMY;  Surgeon: Olean Ree, MD;  Location: [facility];  Service: General;;  . LAPAROTOMY N/A 05/08/2018   Procedure: EXPLORATORY LAPAROTOMY;  Surgeon: Olean Ree, MD;  Location: [facility];  Service: General;  Laterality: N/A;  . ROTATOR CUFF REPAIR    . TONSILLECTOMY    . TYMPANOPLASTY W/ MASTOIDECTOMY     Patient with reports of mastoid surgery (appears to have had surgery on TM; Left).  . VISCERAL ARTERY INTERVENTION N/A 05/04/2018   Procedure: VISCERAL ARTERY INTERVENTION;  Surgeon: Algernon Huxley, MD;  Location: [facility];  Service: Cardiovascular;  Laterality: N/A;   Family History  Problem Relation Age of Onset  . Prostate cancer Neg Hx   . Bladder Cancer Neg Hx   . Kidney cancer Neg Hx    Social History   Socioeconomic History  . Marital status: Divorced    Spouse name: Not on file  . Number of children: Not on file  . Years of education: Not on file  . Highest education level: Not on file  Occupational History  . Not on file  Social Needs  . Financial resource strain: Not hard  at all  . Food insecurity    Worry: Never true    Inability: Never true  . Transportation needs    Medical: No    Non-medical: No  Tobacco Use  . Smoking status: Current Every Day Smoker    Packs/day: 1.00    Years: 70.00    Pack years: 70.00    Types: Cigarettes  . Smokeless tobacco: Never Used  Substance and Sexual Activity  . Alcohol use: No  . Drug use: No  . Sexual activity: Never  Lifestyle  . Physical activity    Days per week: 1 day    Minutes per session: 90 min  . Stress: Not at all  Relationships  . Social Product manager on phone: Not on file    Gets together: Not on file    Attends religious service: Not on file    Active member of club or organization: Not on file    Attends meetings of clubs or organizations: Not on file    Relationship status: Not on file  Other Topics Concern  . Not on file  Social History Narrative  . Not on file    Outpatient Encounter Medications as of 07/17/2018  Medication Sig  . ALPRAZolam (XANAX) 0.25 MG tablet Take 1 tablet (0.25 mg total) by mouth 2 (two) times daily as needed for anxiety.  . calcium-vitamin D (OSCAL WITH D) 500-200 MG-UNIT tablet Take 1 tablet by mouth 2 (two) times daily.  . FEROSUL 325 (65 Fe) MG tablet TAKE 1 TABLET BY MOUTH ONCE DAILY FOR SUPPLEMENT  . folic acid (FOLVITE) 1 MG tablet TAKE 1 TABLET BY MOUTH DAILY  . mirtazapine (REMERON) 30 MG tablet Take 0.5 tablets (15 mg total) by mouth at bedtime.  . Nutritional Supplements (CARNATION BREAKFAST ESSENTIALS) LIQD Take 1 Bottle by mouth 2 (two) times daily between meals.  . pantoprazole (PROTONIX) 40 MG tablet TAKE 1 TABLET DAILY AT 7:00 AM ON AN EMPTY STOMACH  . tamsulosin (FLOMAX) 0.4 MG CAPS capsule TAKE 1 CAPSULE BY MOUTH DAILY  . thiamine 100 MG tablet Take 1 tablet (100 mg total) by mouth daily.  . traZODone (DESYREL) 100 MG tablet Take 1 tablet (100 mg total) by mouth at bedtime.   No facility-administered encounter medications on file as of 07/17/2018.     Activities of Daily Living In your present state of health, do you have any difficulty performing the following activities: 07/17/2018 05/29/2018  Hearing? Y -  Comment He has difficulty hearing conversational tones but does not like to wear his hearing aids. -  Vision? Y -  Comment Eye exam scheduled 08/2018. -  Difficulty concentrating or making decisions? (No Data) -  Comment Unable to assess -  Walking or climbing stairs? Y -  Comment Chronic L hip pain from fall about 1 year ago. -  Dressing or bathing? (No Data) -  Comment  Unable to assess -  Doing errands, shopping? (No Data) Y  Comment Unable to assess -  Preparing Food and eating ? (No Data) -  Comment Unable to assess -  Using the Toilet? (No Data) -  Comment Unable to assess -  In the past six months, have you accidently leaked urine? (No Data) -  Comment Unable to assess -  Do you have problems with loss of bowel control? (No Data) -  Comment Unable to assess -  Managing your Medications? (No Data) -  Comment Unable to assess -  Managing your  Finances? (No Data) -  Comment Unable to assess -  Housekeeping or managing your Housekeeping? (No Data) -  Comment Unable to assess -  Some recent data might be hidden    Patient Care Team: Leone Haven, MD as PCP - General (Family Medicine)   Assessment:   This is a routine wellness examination for Justin Andrews.  I connected with patient 07/17/18 at  1:30 PM EDT by a video/audio enabled telemedicine application and verified that I am speaking with the correct person using two identifiers. Patient stated full name and DOB. Patient gave permission to continue with virtual visit. Patient's location was at home and Nurse's location was at [place] office.   Unable to complete entire visit. Patient states, "I will have to call you back later I have someone here I need to see right now."  Patient hung the phone up during visit and closed connection.  Patient had a completed telemedicine visit with his primary doctor this morning.   Health Screenings  Glaucoma -none Hearing -difficulty hearing. States he does not want to wear his hearing aids.  Labs followed by pcp Dental- dentures Vision- cataract extraction scheduled 08/2018  Social  Alcohol intake - no      Smoking history- current Smokers in home? none Illicit drug use? none Sexually Active -never BMI- discussed the importance of a healthy diet, water intake and the benefits of aerobic exercise.  Educational material provided.   Safety  Patient  feels safe at home- yes Patient does have smoke detectors at home- yes Patient does wear sunscreen or protective clothing when in direct sunlight -yes Patient does wear seat belt when in a moving vehicle -yes  Covid-19 precautions and sickness symptoms discussed.   Depression Screen Patient states he has spoken with his doctor about his depression and will follow up with him.   Medication- reviewed.   Fall Screen Patient denies falls since last November. Followed by pcp.    Memory Screen Patient is alert.   Immunizations Reviewed.   ADLs- unable to assess  Other Providers Patient Care Team: Leone Haven, MD as PCP - General (Family Medicine)  Exercise Activities and Dietary recommendations Current Exercise Habits: The patient does not participate in regular exercise at present  Goals    . Healthy Lifestyle     Walk for exercise Eat a healthy diet Stay hydrated        Fall Risk Fall Risk  06/26/2018 01/16/2018 11/24/2017 07/15/2017 08/05/2016  Falls in the past year? 0 1 0 No No  Number falls in past yr: - 1 0 - -  Injury with Fall? - 1 0 - -   Depression Screen PHQ 2/9 Scores 07/17/2018 01/18/2018 01/16/2018 11/24/2017  PHQ - 2 Score - 2 2 -  PHQ- 9 Score - 10 - -  Exception Documentation Patient refusal - - Patient refusal    Cognitive Function MMSE - Mini Mental State Exam 07/17/2018  Not completed: Unable to complete     6CIT Screen 07/15/2017  What Year? 0 points  What month? 0 points  What time? 0 points  Count back from 20 0 points  Months in reverse 0 points  Repeat phrase 0 points  Total Score 0    Immunization History  Administered Date(s) Administered  . Influenza, High Dose Seasonal PF 09/12/2016, 12/02/2017  . PPD Test 02/25/2018  . Tdap 12/09/2015   Screening Tests Health Maintenance  Topic Date Due  . PNA vac Low Risk Adult (1 of  2 - PCV13) 01/23/2001  . INFLUENZA VACCINE  08/08/2018  . TETANUS/TDAP  12/08/2025      Plan:    Keep all routine maintenance appointments with your doctor.   I have reviewed all listed above.     Varney Biles, LPN  01/13/8164

## 2018-07-17 NOTE — Assessment & Plan Note (Signed)
Asymptomatic.  Monitor for recurrence.

## 2018-07-20 ENCOUNTER — Telehealth: Payer: Self-pay | Admitting: Family Medicine

## 2018-07-20 NOTE — Telephone Encounter (Signed)
l called pt to schedule follow up pt cell number is not working. I tried the other number the person that answered said they have not heard or seen pt.

## 2018-07-21 ENCOUNTER — Telehealth: Payer: Self-pay | Admitting: Family Medicine

## 2018-07-21 NOTE — Telephone Encounter (Signed)
Received a call from Education officer, museum with [employer] asking if I had any concerns for this patient.  I advised that I had the same concerns that I have had when we reported him to DSS earlier this year and when they contacted me previously.  The patient has been neglecting his medical care.  He has not followed through with multiple medical appointments for serious medical issues.  He has not seen dermatology for melanoma and has been referred multiple times and had multiple appointments made for him.  He had a GI issue that he refused evaluation for that likely resulted in a GI bleed that resulted in hospitalization with severely diminished hemoglobin.  He has been referred to psychiatry for depression for intermittent suicidal ideation on several occasions though has not followed through with that evaluation.  He was evaluated while in the hospital by psychiatry and deemed to lack capacity at that time. DSS is seeking to place him under a guardian at this time given the lack of capacity and the patients son has agreed to do this. They are asking for a letter supporting this

## 2018-07-22 ENCOUNTER — Telehealth: Payer: Self-pay

## 2018-07-22 NOTE — Telephone Encounter (Signed)
Patient was called, no answer and no voicemail set up.

## 2018-07-23 ENCOUNTER — Ambulatory Visit: Payer: Medicare HMO | Admitting: Student in an Organized Health Care Education/Training Program

## 2018-07-23 ENCOUNTER — Telehealth: Payer: Self-pay

## 2018-07-23 ENCOUNTER — Telehealth: Payer: Self-pay | Admitting: Family Medicine

## 2018-07-23 NOTE — Telephone Encounter (Signed)
I have advised the patient that I am no longer filling his pain medications given his prior excessive use. He will need to see the pain specialist to be evaluated to determine if he can be prescribed pain medication and have pain procedures through them.

## 2018-07-23 NOTE — Telephone Encounter (Signed)
Patient aware.

## 2018-07-23 NOTE — Telephone Encounter (Signed)
Noted. Can you try to call the patient and see where he is? Did he move in with his son? If you can not get in touch with the patient please contact his son to see if he knows where the patient is. I will forward to [facility] as well so she is aware. Thanks.

## 2018-07-23 NOTE — Telephone Encounter (Signed)
Noted. I have discussed his tramadol with him previously and advised him that I would not be prescribing this medication any more give his excessive use previously. I have advised him that he needs to see pain management to evaluate his pain and determine appropriate treatment. He was supposed to have an appointment today though it appears he cancelled it. He needs to reschedule that appointment so he can discuss his pain management with them.

## 2018-07-23 NOTE — Telephone Encounter (Signed)
I don't see this medication listed in pt's chart.  Last OV:  07/17/18

## 2018-07-23 NOTE — Telephone Encounter (Signed)
Medication Refill - Medication:  traMADol (ULTRAM) 50 MG tablet [Pharmacy Med Name: TRAMADOL HCL 50 MG TAB]   Has the patient contacted their pharmacy? Yes advised to call office.   Preferred Pharmacy (with phone number or street name):  [id number] [address], [place] [phone] (Phone) [phone] (Fax)   Agent: Please be advised that RX refills may take up to 3 business days. We ask that you follow-up with your pharmacy.

## 2018-07-23 NOTE — Telephone Encounter (Signed)
Called the patient's son no answer, called the patient's cell phone and patient answered I informed him that I was trying to get some medication straight with the pharmacy his trazadone, and that the facility stated he was no longer there. The patient stated he was not going back there and he was staying at the [facility] lodge until he finds something else. He also stated he is in a lot of pain and he needed his tramadol he did not need trazadone because he has plenty of that.  Nina,cma

## 2018-07-24 NOTE — Telephone Encounter (Signed)
Patient notified and voiced understanding.

## 2018-07-26 ENCOUNTER — Telehealth: Payer: Self-pay | Admitting: Family Medicine

## 2018-07-26 NOTE — Telephone Encounter (Signed)
-----   Message from De Hollingshead, [facility] sent at 07/17/2018  4:27 PM EDT ----- Doren Custard question. There's not an issue with any of these medications together, besides just being cautious for sedation. I'd probably EITHER increase remeron OR start requip, probably not both at the same time, just to make sure he doesn't get completed snowed over.  However, it isn't a terribly high trazodone dose, so that helps.   Catie ----- Message ----- From: Leone Haven, MD Sent: 07/17/2018   4:13 PM EDT To: De Hollingshead, [facility]  Hey Catie,   This patient is on remeron and trazodone. I would like to increase his remeron to help with anxiety and depression, though wanted to make sure that would be ok with his trazodone dose. Is there any concern with this combo other than sedation? I also would potentially like to start him on requip for RLS and wanted to see if that would be ok with these other medications. I know there is the potential for drowsiness with this medication as well. Thanks for your help.  Randall Hiss

## 2018-07-26 NOTE — Telephone Encounter (Signed)
Please let the patient know that he needs to have lab work completed. Once he has that completed we can consider treatment for his restless leg syndrome. Please get him scheduled for labs. Thanks.

## 2018-07-27 ENCOUNTER — Telehealth: Payer: Self-pay | Admitting: Family Medicine

## 2018-07-27 MED ORDER — TRAZODONE HCL 100 MG PO TABS: 100.0000 mg | ORAL_TABLET | Freq: Every day | ORAL | 3 refills | Status: DC

## 2018-07-27 NOTE — Telephone Encounter (Signed)
Ok to refill trazodone. Thanks.

## 2018-07-27 NOTE — Telephone Encounter (Signed)
Patient is requesting refill on trazodone. Says he cannot sleep ok to fill? Patient has an appointment scheduled for 07/28/18.

## 2018-07-27 NOTE — Telephone Encounter (Signed)
[facility]

## 2018-07-27 NOTE — Telephone Encounter (Signed)
Pt calling about this again.  Advised it can take up to 72 business hours for requests to be processed.  He states he can't wait three days for sleep and hung up.

## 2018-07-27 NOTE — Telephone Encounter (Signed)
PT is requesting a refill on his sleeping pill, can not find the medication on his list, pt may be saying it wrong. Pt has a telephone visit tomorrow with Dr. Caryl Bis.

## 2018-07-27 NOTE — Telephone Encounter (Signed)
Need to know which pharmacy, to send medication. Left message please advise. [facility] nurse may advise.

## 2018-07-28 ENCOUNTER — Ambulatory Visit (INDEPENDENT_AMBULATORY_CARE_PROVIDER_SITE_OTHER): Payer: Medicare HMO | Admitting: Family Medicine

## 2018-07-28 ENCOUNTER — Other Ambulatory Visit: Payer: Self-pay

## 2018-07-28 ENCOUNTER — Telehealth: Payer: Self-pay | Admitting: Family Medicine

## 2018-07-28 DIAGNOSIS — C4322 Malignant melanoma of left ear and external auricular canal: Secondary | ICD-10-CM

## 2018-07-28 DIAGNOSIS — F32A Depression, unspecified: Secondary | ICD-10-CM

## 2018-07-28 DIAGNOSIS — Z0279 Encounter for issue of other medical certificate: Secondary | ICD-10-CM | POA: Diagnosis not present

## 2018-07-28 DIAGNOSIS — R69 Illness, unspecified: Secondary | ICD-10-CM | POA: Diagnosis not present

## 2018-07-28 DIAGNOSIS — F419 Anxiety disorder, unspecified: Secondary | ICD-10-CM

## 2018-07-28 DIAGNOSIS — F329 Major depressive disorder, single episode, unspecified: Secondary | ICD-10-CM

## 2018-07-28 DIAGNOSIS — Z0189 Encounter for other specified special examinations: Secondary | ICD-10-CM

## 2018-07-28 NOTE — Assessment & Plan Note (Signed)
I had a long and detailed discussion with the patient regarding his melanoma and the need to see dermatology for treatment.  I discussed the risks and benefits of having that evaluation with the patient.  He expressed understanding and seemed to comprehend the significance of the situation.  He noted he wanted to see dermatology as that would be the best and most appropriate option to help prolong his life and treat his melanoma.  He understood the information and the potential consequences of refusing treatment.  Referral will be placed.

## 2018-07-28 NOTE — Telephone Encounter (Incomplete)
Brooke calling to speak with Lorriane Shire about the [facility] form that needs to be completed by PCP to get patient into a [facility].

## 2018-07-28 NOTE — Progress Notes (Signed)
Virtual Visit via telephone Note  This visit type was conducted due to national recommendations for restrictions regarding the COVID-19 pandemic (e.g. social distancing).  This format is felt to be most appropriate for this patient at this time.  All issues noted in this document were discussed and addressed.  No physical exam was performed (except for noted visual exam findings with Video Visits).   I connected with Justin Andrews today at 10:30 AM EDT by a video enabled telemedicine application or telephone and verified that I am speaking with the correct person using two identifiers. Location patient: hotel Location provider: work Persons participating in the virtual visit: patient, provider  I discussed the limitations, risks, security and privacy concerns of performing an evaluation and management service by telephone and the availability of in person appointments. I also discussed with the patient that there may be a patient responsible charge related to this service. The patient expressed understanding and agreed to proceed.  Interactive audio and video telecommunications were attempted between this provider and patient, however failed, due to patient having technical difficulties OR patient did not have access to video capability.  We continued and completed visit with audio only.   Reason for visit: Follow-up.  HPI: Depression: Patient notes he feels very depressed.  He sometimes cannot stand feeling depressed.  He notes no suicidal ideation.  Has been taking mirtazapine though feels as though it has not made a difference.  He does report wanting to be alive for as long as possible.  He does have lots of trouble sleeping and he has been out of his trazodone.  He reports the pharmacy will not fill until August.  He notes he would very much like to see his psychiatrist as he feels as though they would be able to help him feel better and feel less depressed.  He understands that if he does not  have this adequately treated or evaluated that he could potentially get worse and it could progress to where he has suicidal ideation.  He feels as though the depression could kill him at some point if he does not follow appropriate treatment.  Melanoma: Patient notes this is spreading some over his left face.  I discussed the severity of the melanoma and the fact that it could potentially be spreading throughout his body and end up killing him in the future.  I discussed that the treatment options would be a procedural intervention through dermatology.  I discussed that if he did not undergo this there was the potential that the melanoma could spread and end up killing him.  He verbalizes understanding of this and notes he needs to see dermatology to have this taken care of.  He does report that he is able to get a ride at this time and would like another referral to dermatology.  Capacity evaluation: Patient reports that his son is seeking to deem him incompetent.  The patient is not happy with this.  During our visit today he was able to express understanding of his medical issues and was able to state a decision regarding medical issues discussed.  He additionally was able to explain how the information provided to him regarding depression and melanoma applied to himself and he was able to compare information and infer consequences of his choices.  The patient was deemed to lack capacity during his prior hospitalization by a psychiatrist.   ROS: See pertinent positives and negatives per HPI.  Past Medical History:  Diagnosis Date  C. difficile diarrhea 11/2017   Elevated serum creatinine    History of melanoma    Hypertension    Osteoarthritis    Tobacco abuse     Past Surgical History:  Procedure Laterality Date   ANKLE FRACTURE SURGERY     CENTRAL VENOUS CATHETER INSERTION  05/08/2018   Procedure: INSERTION CENTRAL LINE ADULT;  Surgeon: Olean Ree, MD;  Location: [facility];   Service: General;;   ESOPHAGOGASTRODUODENOSCOPY  05/08/2018   Procedure: ESOPHAGOGASTRODUODENOSCOPY (EGD);  Surgeon: Olean Ree, MD;  Location: [facility];  Service: General;;   ESOPHAGOGASTRODUODENOSCOPY (EGD) WITH PROPOFOL N/A 05/03/2018   Procedure: ESOPHAGOGASTRODUODENOSCOPY (EGD) WITH PROPOFOL;  Surgeon: Toledo, Benay Pike, MD;  Location: [facility];  Service: Gastroenterology;  Laterality: N/A;   EXTERNAL EAR SURGERY     INTRAMEDULLARY (IM) NAIL INTERTROCHANTERIC Left 12/01/2017   Procedure: INTRAMEDULLARY (IM) NAIL INTERTROCHANTRIC;  Surgeon: Thornton Park, MD;  Location: [facility];  Service: Orthopedics;  Laterality: Left;   JEJUNOSTOMY  05/08/2018   Procedure: JEJUNOSTOMY;  Surgeon: Olean Ree, MD;  Location: [facility];  Service: General;;   LAPAROTOMY N/A 05/08/2018   Procedure: EXPLORATORY LAPAROTOMY;  Surgeon: Olean Ree, MD;  Location: [facility];  Service: General;  Laterality: N/A;   ROTATOR CUFF REPAIR     TONSILLECTOMY     TYMPANOPLASTY W/ MASTOIDECTOMY     Patient with reports of mastoid surgery (appears to have had surgery on TM; Left).   VISCERAL ARTERY INTERVENTION N/A 05/04/2018   Procedure: VISCERAL ARTERY INTERVENTION;  Surgeon: Algernon Huxley, MD;  Location: [place] [facility];  Service: Cardiovascular;  Laterality: N/A;    Family History  Problem Relation Age of Onset   Prostate cancer Neg Hx    Bladder Cancer Neg Hx    Kidney cancer Neg Hx     SOCIAL HX: Smoker   Current Outpatient Medications:    ALPRAZolam (XANAX) 0.25 MG tablet, Take 1 tablet (0.25 mg total) by mouth 2 (two) times daily as needed for anxiety., Disp: 12 tablet, Rfl: 0   calcium-vitamin D (OSCAL WITH D) 500-200 MG-UNIT tablet, Take 1 tablet by mouth 2 (two) times daily., Disp: 60 tablet, Rfl: 1   FEROSUL 325 (65 Fe) MG tablet, TAKE 1 TABLET BY MOUTH ONCE DAILY FOR SUPPLEMENT, Disp: 30 tablet, Rfl: 0   folic acid (FOLVITE) 1 MG tablet, TAKE 1 TABLET BY MOUTH DAILY, Disp:  30 tablet, Rfl: 0   mirtazapine (REMERON) 30 MG tablet, Take 0.5 tablets (15 mg total) by mouth at bedtime., Disp: 30 tablet, Rfl: 0   Nutritional Supplements (CARNATION BREAKFAST ESSENTIALS) LIQD, Take 1 Bottle by mouth 2 (two) times daily between meals., Disp: 14220 mL, Rfl: 1   pantoprazole (PROTONIX) 40 MG tablet, TAKE 1 TABLET DAILY AT 7:00 AM ON AN EMPTY STOMACH, Disp: 30 tablet, Rfl: 0   tamsulosin (FLOMAX) 0.4 MG CAPS capsule, TAKE 1 CAPSULE BY MOUTH DAILY, Disp: 30 capsule, Rfl: 0   thiamine 100 MG tablet, Take 1 tablet (100 mg total) by mouth daily., Disp: 30 tablet, Rfl: 1   traZODone (DESYREL) 100 MG tablet, Take 1 tablet (100 mg total) by mouth at bedtime., Disp: 30 tablet, Rfl: 3  EXAM: This was a telehealth telephone visit and thus no physical exam was completed.   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  I discussed the risks and benefits of having that evaluation with the patient.  He expressed understanding and seemed to comprehend the significance of the situation.  He noted he wanted to see dermatology as that would be the best and most appropriate option to help prolong his life and treat his melanoma.  He understood the information and the potential consequences of refusing treatment.  Referral will be placed.  Anxiety and depression The patient continues to have significant issues with depression.  He denies suicidal ideation and notes he wants to live for as long as he is able to.  He expresses understanding of the severity of his depression and the potential risk of it worsening.  He has a desire for treatment and understands the importance of this.  He understands the consequences if he were to defer treatment and evaluation.  We will place a referral to psychiatry.  Encounter  for competency evaluation The patient seems to have adequate competency and capacity based on the issues discussed today during our visit.  I do have concern that he may have competency that fluctuates though his competency appeared to be intact today during our discussion.  He has not followed through on many of his referrals for his serious medical issues and has had periods of time where he does not adequately care for himself and some of the decisions he makes may represent fluctuating competency issues though could also just be bad decisions on his part.  We will refer to psychiatry to help further with this process.  I have discussed this with his Education officer, museum and advised her of my findings from today's visit.  I will complete a letter regarding his competency.   I additionally had a discussion with patient regarding his repeated lack of compliance with referrals and other medical care.  I advised that if he is not able to comply with his treatment regimen and my recommendations regarding his medical care that there is the potential that I would no longer to be able to provide medical care to him and he could be dismissed from our practice.  He was advised he would need to seek a new primary care physician if this occurred.   I discussed the assessment and treatment plan with the patient. The patient was provided an opportunity to ask questions and all were answered. The patient agreed with the plan and demonstrated an understanding of the instructions.   The patient was advised to call back or seek an in-person evaluation if the symptoms worsen or if the condition fails to improve as anticipated.  I provided 23 minutes of non-face-to-face time during this encounter.   [name], MD

## 2018-07-28 NOTE — Telephone Encounter (Signed)
I called and informed the patient that its too early to pick up his medication, he understood when I explained how the insurance would only pay for medications every 30 days and he would lhave to wait.  Justin Andrews,cma

## 2018-07-28 NOTE — Telephone Encounter (Signed)
Patient calling about this medication. He states he was advised by pharmacy that it was too early to pick it up. He stated he discussed this with PCP at appointment today and would like to know if there was an update.Please advise.

## 2018-07-28 NOTE — Assessment & Plan Note (Signed)
The patient continues to have significant issues with depression.  He denies suicidal ideation and notes he wants to live for as long as he is able to.  He expresses understanding of the severity of his depression and the potential risk of it worsening.  He has a desire for treatment and understands the importance of this.  He understands the consequences if he were to defer treatment and evaluation.  We will place a referral to psychiatry.

## 2018-07-28 NOTE — Telephone Encounter (Signed)
The provider called her back.

## 2018-07-28 NOTE — Assessment & Plan Note (Signed)
The patient seems to have adequate competency and capacity based on the issues discussed today during our visit.  I do have concern that he may have competency that fluctuates though his competency appeared to be intact today during our discussion.  He has not followed through on many of his referrals for his serious medical issues and has had periods of time where he does not adequately care for himself and some of the decisions he makes may represent fluctuating competency issues though could also just be bad decisions on his part.  We will refer to psychiatry to help further with this process.  I have discussed this with his Education officer, museum and advised her of my findings from today's visit.  I will complete a letter regarding his competency.

## 2018-07-29 ENCOUNTER — Telehealth: Payer: Self-pay | Admitting: Family Medicine

## 2018-07-29 ENCOUNTER — Emergency Department: Payer: Medicare HMO

## 2018-07-29 ENCOUNTER — Emergency Department
Admission: EM | Admit: 2018-07-29 | Discharge: 2018-07-29 | Discharge status: Against Medical Advice | Payer: Medicare HMO | Attending: Emergency Medicine | Admitting: Emergency Medicine

## 2018-07-29 ENCOUNTER — Other Ambulatory Visit: Payer: Self-pay

## 2018-07-29 DIAGNOSIS — Z5321 Procedure and treatment not carried out due to patient leaving prior to being seen by health care provider: Secondary | ICD-10-CM | POA: Insufficient documentation

## 2018-07-29 DIAGNOSIS — R0602 Shortness of breath: Secondary | ICD-10-CM | POA: Diagnosis not present

## 2018-07-29 LAB — BASIC METABOLIC PANEL
Anion gap: 8 (ref 5–15)
BUN: 23 mg/dL (ref 8–23)
CO2: 22 mmol/L (ref 22–32)
Calcium: 9.2 mg/dL (ref 8.9–10.3)
Chloride: 107 mmol/L (ref 98–111)
Creatinine, Ser: 1.83 mg/dL — ABNORMAL HIGH (ref 0.61–1.24)
GFR calc Af Amer: 39 mL/min — ABNORMAL LOW (ref >60)
GFR calc non Af Amer: 34 mL/min — ABNORMAL LOW (ref >60)
Glucose, Bld: 157 mg/dL — ABNORMAL HIGH (ref 70–99)
Potassium: 3.8 mmol/L (ref 3.5–5.1)
Sodium: 137 mmol/L (ref 135–145)

## 2018-07-29 LAB — CBC
HCT: 32.9 % — ABNORMAL LOW (ref 39.0–52.0)
Hemoglobin: 10.6 g/dL — ABNORMAL LOW (ref 13.0–17.0)
MCH: 29.4 pg (ref 26.0–34.0)
MCHC: 32.2 g/dL (ref 30.0–36.0)
MCV: 91.1 fL (ref 80.0–100.0)
Platelets: 336 10*3/uL (ref 150–400)
RBC: 3.61 MIL/uL — ABNORMAL LOW (ref 4.22–5.81)
RDW: 15.7 % — ABNORMAL HIGH (ref 11.5–15.5)
WBC: 12.4 10*3/uL — ABNORMAL HIGH (ref 4.0–10.5)
nRBC: 0 % (ref 0.0–0.2)

## 2018-07-29 NOTE — ED Triage Notes (Addendum)
Pt c/o SOB since yesterday, was escorted buy adult protective services with a protection order, states he is living [facility]. Denies congestion has a hx of COPD. Call Bridgette Habermann [phone] when the pt is ready for discharge or with questions.

## 2018-07-29 NOTE — Telephone Encounter (Signed)
Letter completed. Please contact the patients DSS social worker to confirm the fax number for it to be sent to.

## 2018-07-29 NOTE — ED Notes (Signed)
Pt states his son is coming to pick him up

## 2018-07-29 NOTE — Telephone Encounter (Signed)
I called pt and was unable to leave vm due to being full to sch Return in about 3 months (around 10/28/2018).

## 2018-07-30 ENCOUNTER — Ambulatory Visit: Payer: Medicare HMO | Admitting: Family Medicine

## 2018-07-30 NOTE — Telephone Encounter (Signed)
Patient is scheduled for labs.  Alin Chavira,cma  

## 2018-07-31 ENCOUNTER — Emergency Department: Payer: Medicare HMO

## 2018-07-31 ENCOUNTER — Emergency Department
Admission: EM | Admit: 2018-07-31 | Discharge: 2018-08-03 | Disposition: A | Discharge status: Home / Self Care | Payer: Medicare HMO | Attending: Emergency Medicine | Admitting: Emergency Medicine

## 2018-07-31 ENCOUNTER — Other Ambulatory Visit: Payer: Medicare HMO

## 2018-07-31 ENCOUNTER — Ambulatory Visit: Payer: Self-pay | Admitting: *Deleted

## 2018-07-31 ENCOUNTER — Telehealth: Payer: Self-pay | Admitting: Family Medicine

## 2018-07-31 ENCOUNTER — Other Ambulatory Visit: Payer: Self-pay

## 2018-07-31 DIAGNOSIS — Z79899 Other long term (current) drug therapy: Secondary | ICD-10-CM | POA: Insufficient documentation

## 2018-07-31 DIAGNOSIS — R0600 Dyspnea, unspecified: Secondary | ICD-10-CM | POA: Diagnosis not present

## 2018-07-31 DIAGNOSIS — I1310 Hypertensive heart and chronic kidney disease without heart failure, with stage 1 through stage 4 chronic kidney disease, or unspecified chronic kidney disease: Secondary | ICD-10-CM | POA: Insufficient documentation

## 2018-07-31 DIAGNOSIS — R0602 Shortness of breath: Secondary | ICD-10-CM | POA: Diagnosis present

## 2018-07-31 DIAGNOSIS — N183 Chronic kidney disease, stage 3 (moderate): Secondary | ICD-10-CM | POA: Insufficient documentation

## 2018-07-31 DIAGNOSIS — R5381 Other malaise: Secondary | ICD-10-CM | POA: Diagnosis not present

## 2018-07-31 DIAGNOSIS — F17210 Nicotine dependence, cigarettes, uncomplicated: Secondary | ICD-10-CM | POA: Diagnosis not present

## 2018-07-31 DIAGNOSIS — R918 Other nonspecific abnormal finding of lung field: Secondary | ICD-10-CM | POA: Diagnosis not present

## 2018-07-31 DIAGNOSIS — Z20828 Contact with and (suspected) exposure to other viral communicable diseases: Secondary | ICD-10-CM | POA: Insufficient documentation

## 2018-07-31 DIAGNOSIS — R69 Illness, unspecified: Secondary | ICD-10-CM | POA: Diagnosis not present

## 2018-07-31 LAB — CBC
HCT: 27.5 % — ABNORMAL LOW (ref 39.0–52.0)
Hemoglobin: 8.9 g/dL — ABNORMAL LOW (ref 13.0–17.0)
MCH: 29.6 pg (ref 26.0–34.0)
MCHC: 32.4 g/dL (ref 30.0–36.0)
MCV: 91.4 fL (ref 80.0–100.0)
Platelets: 290 10*3/uL (ref 150–400)
RBC: 3.01 MIL/uL — ABNORMAL LOW (ref 4.22–5.81)
RDW: 15.9 % — ABNORMAL HIGH (ref 11.5–15.5)
WBC: 9 10*3/uL (ref 4.0–10.5)
nRBC: 0 % (ref 0.0–0.2)

## 2018-07-31 LAB — COMPREHENSIVE METABOLIC PANEL
ALT: 12 U/L (ref 0–44)
AST: 15 U/L (ref 15–41)
Albumin: 3.4 g/dL — ABNORMAL LOW (ref 3.5–5.0)
Alkaline Phosphatase: 69 U/L (ref 38–126)
Anion gap: 7 (ref 5–15)
BUN: 27 mg/dL — ABNORMAL HIGH (ref 8–23)
CO2: 22 mmol/L (ref 22–32)
Calcium: 9 mg/dL (ref 8.9–10.3)
Chloride: 110 mmol/L (ref 98–111)
Creatinine, Ser: 2.17 mg/dL — ABNORMAL HIGH (ref 0.61–1.24)
GFR calc Af Amer: 32 mL/min — ABNORMAL LOW (ref >60)
GFR calc non Af Amer: 27 mL/min — ABNORMAL LOW (ref >60)
Glucose, Bld: 128 mg/dL — ABNORMAL HIGH (ref 70–99)
Potassium: 3.5 mmol/L (ref 3.5–5.1)
Sodium: 139 mmol/L (ref 135–145)
Total Bilirubin: 0.6 mg/dL (ref 0.3–1.2)
Total Protein: 6.8 g/dL (ref 6.5–8.1)

## 2018-07-31 LAB — SARS CORONAVIRUS 2 BY RT PCR (HOSPITAL ORDER, PERFORMED IN ~~LOC~~ HOSPITAL LAB): SARS Coronavirus 2: NEGATIVE

## 2018-07-31 NOTE — ED Notes (Signed)
Abigail from [employer] notified that patient is ready for discharge. Vernie Shanks states that they will provide transportation.

## 2018-07-31 NOTE — ED Notes (Signed)
Waiting on [employer] to come and pick pt up. Pt notified of wait.

## 2018-07-31 NOTE — Telephone Encounter (Signed)
Please ask the patient if he is ok with [place] contacting his case worker to inform her that he needs to be seen at urgent care. Thanks.

## 2018-07-31 NOTE — Telephone Encounter (Signed)
Called and spoke with [place]. I advised that evaluation today would be my preference. She noted that she wanted to know if it could wait until Monday and I advised it should not wait until Monday.

## 2018-07-31 NOTE — Telephone Encounter (Signed)
Called and spoke to pt.  Pt aware of x-ray results and advisement from PCP's note.  Pt said that he did not have anyone to take him to an urgent care today but he would have his case worker take him tomorrow.  Discussed with pt calling [facility] to transport to ED if he needed a ride but pt declined saying that he would go to [facility] tomorrow.

## 2018-07-31 NOTE — Discharge Instructions (Addendum)
You were seen in the emergency department today for possible pneumonia.  Your lab work today and x-ray is reassuring.  Your Corona virus test is negative.  Please follow-up with your primary care doctor this week for recheck/reevaluation.

## 2018-07-31 NOTE — Telephone Encounter (Signed)
Called and spoke to pt.  Advisement given per PCP's note.  Pt said that he is feeling fine right now and declined to go to ED or UC to be evaluated in-person.  Pt said that he feels most of his symptoms are coming from depression.  Offered pt a virtual appt to discuss depression w/ Dr. Caryl Bis.  Pt declined stating that Dr. Caryl Bis will want him to see a psychiatrist.  Notes forwarded to PCP for review.

## 2018-07-31 NOTE — ED Provider Notes (Signed)
[facility] Emergency Department Provider Note  Time seen: 9:05 PM  I have reviewed the triage vital signs and the nursing notes.   HISTORY  Chief Complaint Pneumonia (sent by MD for possible pneumonia)   HPI Justin Andrews is a 84 y.o. male with a past medical history of hypertension, restless leg syndrome, CKD, under guardianship of APS presents to the emergency department for pneumonia.  According to the patient Monday and Tuesday he had been feeling short of breath, he contacted his doctor who ordered an outpatient x-ray as performed on Tuesday.  It had resulted today, his PCP requested that he come to the ER, contacted his guardian who placed him under an IVC to bring him to the ER.   Here the patient is awake and alert, calm and cooperative has no complaints.  States he no longer feels short of breath.  Denies any cough besides his normal smoker's cough per patient denies any fever.  Past Medical History:  Diagnosis Date  . C. difficile diarrhea 11/2017  . Elevated serum creatinine   . History of melanoma   . Hypertension   . Osteoarthritis   . Tobacco abuse     Patient Active Problem List   Diagnosis Date Noted  . Encounter for competency evaluation 07/28/2018  . History of Clostridioides difficile infection 07/17/2018  . Insufficient social support 06/27/2018  . Abdominal pain 05/28/2018  . GI bleed 05/02/2018  . Hemorrhagic shock ([place]) 05/02/2018  . Altered mental status 05/02/2018  . Anemia due to blood loss, acute 05/02/2018  . RLS (restless legs syndrome) 04/22/2018  . Wrist pain 04/22/2018  . Chronic hip pain 04/08/2018  . Suicidal ideation 03/19/2018  . Protein-calorie malnutrition, severe 12/03/2017  . Closed left hip fracture ([place]) 11/30/2017  . Diarrhea 11/15/2017  . Duodenum disorder 11/15/2017  . Frequency of urination 10/21/2017  . Cigarette smoker 10/06/2017  . Cough 09/25/2017  . Changes in vision 09/25/2017  . Proteinuria  09/25/2017  . Basal cell carcinoma 02/28/2017  . 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 DOE (dyspnea on exertion) 01/17/2017  . Chronic right upper quadrant pain 11/15/2016  . Balance problem 11/15/2016  . Simple renal cyst 10/21/2016  . Prostate nodule 10/16/2016  . Dysuria 10/16/2016  . CKD (chronic kidney disease) stage 3, GFR 30-59 ml/min (HCC) 08/05/2016  . Anemia 08/05/2016  . Anxiety and depression 08/05/2016  . Weight loss 08/05/2016  . Sleep disturbance 07/23/2016  . Hypertension   . Osteoarthritis   . Tobacco abuse   . 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[place]) 03/06/2016    Past Surgical History:  Procedure Laterality Date  . ANKLE FRACTURE SURGERY    . CENTRAL VENOUS CATHETER INSERTION  05/08/2018   Procedure: INSERTION CENTRAL LINE ADULT;  Surgeon: Olean Ree, MD;  Location: [facility];  Service: General;;  . ESOPHAGOGASTRODUODENOSCOPY  05/08/2018   Procedure: ESOPHAGOGASTRODUODENOSCOPY (EGD);  Surgeon: Olean Ree, MD;  Location: [facility];  Service: General;;  . ESOPHAGOGASTRODUODENOSCOPY (EGD) WITH PROPOFOL N/A 05/03/2018   Procedure: ESOPHAGOGASTRODUODENOSCOPY (EGD) WITH PROPOFOL;  Surgeon: Toledo, Benay Pike, MD;  Location: [facility];  Service: Gastroenterology;  Laterality: N/A;  . EXTERNAL EAR SURGERY    . INTRAMEDULLARY (IM) NAIL INTERTROCHANTERIC Left 12/01/2017   Procedure: INTRAMEDULLARY (IM) NAIL INTERTROCHANTRIC;  Surgeon: Thornton Park, MD;  Location: [facility];  Service: Orthopedics;  Laterality: Left;  . JEJUNOSTOMY  05/08/2018   Procedure: JEJUNOSTOMY;  Surgeon: Olean Ree, MD;  Location: [facility];  Service: General;;  .  LAPAROTOMY N/A 05/08/2018   Procedure: EXPLORATORY LAPAROTOMY;  Surgeon: Olean Ree, MD;  Location: [facility];  Service: General;  Laterality: N/A;  . ROTATOR CUFF REPAIR    . TONSILLECTOMY    . TYMPANOPLASTY W/ MASTOIDECTOMY     Patient with reports of mastoid surgery (appears to have had surgery on TM; Left).  . VISCERAL  ARTERY INTERVENTION N/A 05/04/2018   Procedure: VISCERAL ARTERY INTERVENTION;  Surgeon: Algernon Huxley, MD;  Location: [facility];  Service: Cardiovascular;  Laterality: N/A;    Prior to Admission medications   Medication Sig Start Date End Date Taking? Authorizing Provider  ALPRAZolam (XANAX) 0.25 MG tablet Take 1 tablet (0.25 mg total) by mouth 2 (two) times daily as needed for anxiety. 07/14/18   Leone Haven, MD  calcium-vitamin D (OSCAL WITH D) 500-200 MG-UNIT tablet Take 1 tablet by mouth 2 (two) times daily. 07/16/18   Leone Haven, MD  FEROSUL 325 (65 Fe) MG tablet TAKE 1 TABLET BY MOUTH ONCE DAILY FOR SUPPLEMENT 07/14/18   Leone Haven, MD  folic acid (FOLVITE) 1 MG tablet TAKE 1 TABLET BY MOUTH DAILY 07/14/18   Leone Haven, MD  mirtazapine (REMERON) 30 MG tablet Take 0.5 tablets (15 mg total) by mouth at bedtime. 06/17/18   Loletha Grayer, MD  Nutritional Supplements (CARNATION BREAKFAST ESSENTIALS) LIQD Take 1 Bottle by mouth 2 (two) times daily between meals. 07/17/18   Leone Haven, MD  pantoprazole (PROTONIX) 40 MG tablet TAKE 1 TABLET DAILY AT 7:00 AM ON AN EMPTY STOMACH 07/14/18   Leone Haven, MD  tamsulosin (FLOMAX) 0.4 MG CAPS capsule TAKE 1 CAPSULE BY MOUTH DAILY 07/14/18   Leone Haven, MD  thiamine 100 MG tablet Take 1 tablet (100 mg total) by mouth daily. 07/16/18   Leone Haven, MD  traZODone (DESYREL) 100 MG tablet Take 1 tablet (100 mg total) by mouth at bedtime. 07/27/18   Leone Haven, MD    Allergies  Allergen Reactions  . Aspirin Other (See Comments)    bleeding    Family History  Problem Relation Age of Onset  . Prostate cancer Neg Hx   . Bladder Cancer Neg Hx   . Kidney cancer Neg Hx     Social History Social History   Tobacco Use  . Smoking status: Current Every Day Smoker    Packs/day: 1.00    Years: 70.00    Pack years: 70.00    Types: Cigarettes  . Smokeless tobacco: Never Used  Substance Use  Topics  . Alcohol use: No  . Drug use: No    Review of Systems Constitutional: Negative for fever. ENT: Negative for recent illness/congestion Cardiovascular: Negative for chest pain. Respiratory: Shortness of breath earlier this week, now resolved.  Negative for cough. Gastrointestinal: Negative for abdominal pain, vomiting  Genitourinary: Negative for urinary compaints Musculoskeletal: Negative for musculoskeletal complaints Skin: Negative for skin complaints  Neurological: Negative for headache All other ROS negative  ____________________________________________   PHYSICAL EXAM:  VITAL SIGNS: ED Triage Vitals  Enc Vitals Group     BP 07/31/18 2046 (!) 145/91     Pulse Rate 07/31/18 2046 100     Resp 07/31/18 2046 17     Temp 07/31/18 2046 98.5 F (36.9 C)     Temp Source 07/31/18 2046 Oral     SpO2 07/31/18 2046 99 %     Weight 07/31/18 2047 120 lb (54.4 kg)  Height 07/31/18 2047 5\' 9"  (1.753 m)     Head Circumference --      Peak Flow --      Pain Score 07/31/18 2047 0     Pain Loc --      Pain Edu? --      Excl. in [place]? --     Constitutional: Alert and oriented. Well appearing and in no distress. Eyes: Normal exam ENT      Head: Normocephalic and atraumatic.      Mouth/Throat: Mucous membranes are moist. Cardiovascular: Normal rate, regular rhythm. No murmur Respiratory: Normal respiratory effort without tachypnea nor retractions. Breath sounds are clear  Gastrointestinal: Soft and nontender. No distention.   Musculoskeletal: Nontender with normal range of motion in all extremities.  Neurologic:  Normal speech and language. No gross focal neurologic deficits  Skin:  Skin is warm, dry and intact.  Psychiatric: Mood and affect are normal.  ____________________________________________    EKG  EKG viewed and interpreted by myself shows sinus tachycardia 101 bpm with a narrow QRS, normal axis, normal intervals, no concerning ST  changes.  ____________________________________________    RADIOLOGY  IMPRESSION:  Resolution of previously seen right basilar changes consistent with  previous atelectasis.   No acute abnormality noted.   ____________________________________________   INITIAL IMPRESSION / ASSESSMENT AND PLAN / ED COURSE  Pertinent labs & imaging results that were available during my care of the patient were reviewed by me and considered in my medical decision making (see chart for details).   Patient presents to the emergency department for possible pneumonia diagnosed by his PCP.  Overall the patient appears well, he was placed under an IVC as he is a ward of the state to bring him to the ER for evaluation.  Here he is calm cooperative, no distress.  Vital signs are stable including a 99% room air saturation.  We will discontinue the IVC, however patient is agreeable to stay for medical work-up.  Differential this time would include pneumonia, coronavirus, lung or chest mass.  Overall the patient appears well.  We will obtain labs, x-ray, cover test and continue to closely monitor.  Patient's x-ray shows resolution of right basilar changes likely representing atelectasis.  Patient's lab work is largely nonrevealing, creatinine largely unchanged from baseline.  Anemia although slightly decreased from recent labs is largely unchanged from 1 month ago.  As the patient appears well is requesting to be discharged home, I believe the patient is safe for discharge home.  Coronavirus test is negative.  VIYAAN CHAMPINE was evaluated in Emergency Department on 07/31/2018 for the symptoms described in the history of present illness. He was evaluated in the context of the global COVID-19 pandemic, which necessitated consideration that the patient might be at risk for infection with the SARS-CoV-2 virus that causes COVID-19. Institutional protocols and algorithms that pertain to the evaluation of patients at risk for  COVID-19 are in a state of rapid change based on information released by regulatory bodies including the CDC and federal and state organizations. These policies and algorithms were followed during the patient's care in the ED.  ____________________________________________   FINAL CLINICAL IMPRESSION(S) / ED DIAGNOSES  Dyspnea   Harvest Dark, MD 07/31/18 2241

## 2018-07-31 NOTE — Telephone Encounter (Signed)
Call received from patient's APS social worker.  She reports several things have occurred recently that have made her concerned about the patient's mental status and competence.  She notes he took a bowel movement in the middle of the floor of his room at his new facility and told his roommate to pick it up.  She notes there was no associated distress or pain with this.  He is also been shouting and cursing at the staff.  She reports he was sent to the ED for evaluation of dyspnea though he left without being evaluated.  She reports that she was advised that the chest x-ray was mildly abnormal.  I advised that I would follow-up on a chest x-ray.  Discussed that I would talk to our referral coordinator about getting him set up with psychiatry to evaluate him for competence given that he seemed to be competent during my discussion with him earlier this week.  After discussion with referral coordinator it seems that [place] for psychiatric evaluation is likely the most expeditious route.  Referral coordinator will contact Mr. Prabhakar and APS social worker Bridgette Habermann [phone] to inform them that this is the best option for psychiatry evaluation in a quick manner.

## 2018-07-31 NOTE — Telephone Encounter (Signed)
Pt called stating that he is having shortness of breath which stated on 07/29/2018 at night; he went to the ED on the 22nd but he left before being seen because he did not want to spend the night; the pt says that he was gasping for air; recommedations made per the pt declined triage RN calling 911; he states that he would like to speak with his MD; the pt sees Dr Caryl Bis, [facility]; pt transferred to [facility] for final disposition.  Reason for Disposition . [1] MODERATE difficulty breathing (e.g., speaks in phrases, SOB even at rest, pulse 100-120) AND [2] NEW-onset or WORSE than normal  Answer Assessment - Initial Assessment Questions 1. RESPIRATORY STATUS: "Describe your breathing?" (e.g., wheezing, shortness of breath, unable to speak, severe coughing)      Shortness of breath 2. ONSET: "When did this breathing problem begin?"      07/29/2018 3. PATTERN "Does the difficult breathing come and go, or has it been constant since it started?"      Intermittent; happens at night 4. SEVERITY: "How bad is your breathing?" (e.g., mild, moderate, severe)    - MILD: No SOB at rest, mild SOB with walking, speaks normally in sentences, can lay down, no retractions, pulse < 100.    - MODERATE: SOB at rest, SOB with minimal exertion and prefers to sit, cannot lie down flat, speaks in phrases, mild retractions, audible wheezing, pulse 100-120.    - SEVERE: Very SOB at rest, speaks in single words, struggling to breathe, sitting hunched forward, retractions, pulse > 120      Felt like was going to pass out; not present now 5. RECURRENT SYMPTOM: "Have you had difficulty breathing before?" If so, ask: "When was the last time?" and "What happened that time?"      no 6. CARDIAC HISTORY: "Do you have any history of heart disease?" (e.g., heart attack, angina, bypass surgery, angioplasty)       7. LUNG HISTORY: "Do you have any history of lung disease?"  (e.g., pulmonary embolus, asthma, emphysema)    no 8.  CAUSE: "What do you think is causing the breathing problem?"      Not sure 9. OTHER SYMPTOMS: "Do you have any other symptoms? (e.g., dizziness, runny nose, cough, chest pain, fever)    10. PREGNANCY: "Is there any chance you are pregnant?" "When was your last menstrual period?"       n/a 11. TRAVEL: "Have you traveled out of the country in the last month?" (e.g., travel history, exposures)       no  Protocols used: BREATHING DIFFICULTY-A-AH

## 2018-07-31 NOTE — ED Triage Notes (Signed)
Patient coming [facility] from home under IVC by APS. Patient's MD, Milbert Coulter, wanted patient to be evaluated for pneumonia due to results of chest xray taken on 7/22. Patient refused to come, and was already under APS, so the APS representative had patient IVC'd and brought to the ED to be evaluated for pneumonia.

## 2018-07-31 NOTE — Telephone Encounter (Signed)
Case worker, Jerene Pitch from [facility] called back wanting to know if Dr. Caryl Bis wants patient to be seen today at an UC or if it is ok for pt to wait until tomorrow.    DSS caseworker needs a quick answer.  Cell phone # is [phone].

## 2018-07-31 NOTE — Telephone Encounter (Addendum)
Spoke to pt.  Pt stated that he had SOB on Tuesday and Wednesday and went to ED but left before being seen.  Pt said that he was not having any problems gasping for air today and that he was "not breathing really good but was tolerable".  Pt was talking on phone without any noticeable gasping heard.    Reminded pt of [facility] RN recommendation to call 9-1-1 or to go to ED but patient wanted to speak to Dr. Caryl Bis.  Pt feels he is ok and doesn't need to go to ED.  Pt said he was feeling a little depressed and down and out.  Pt denied feelings to harm himself.  Pt inquired about his lab appt.  Checked schedule and informed pt that his lab appt was scheduled for this morning.  Pt said he did not have a way to get to his appt.  Rescheduled pt's lab appt for 08/05/18.  Pt still asking to speak w/ Dr. Caryl Bis.  Informed pt that I will check to see about scheduling a same day appt.  Pt confirmed that staff is with him in the group home and staff is aware of his breathing symptoms.  Advised pt to call 9-1-1 or go to ED if symptoms worsen.   Reviewed signs and symptoms with pt.  Pt voiced understanding.  Pt is waiting to find out about a phone appt today with Dr. Caryl Bis.  Will forward to Dr. Caryl Bis for review.

## 2018-07-31 NOTE — Telephone Encounter (Addendum)
Pt's caseworker, Jerene Pitch from [facility] called stating pt told her he had pneumonia.  Case worker wanted to confirm.  Informed case worker that pt was not told that he has pneumonia but X-ray from ED visit may indicate pneumonia and Dr. Caryl Bis wanted pt to have vital signs checked and have UC determine need for antibiotic.  Also that Dr. Caryl Bis would like for pt to be evaluated in person today based on his review of pt's X-ray results per PCP's note.  Case worker said that it might not be possible to get pt to urgent care today given it was already close to 5:00 pm but she would check with her supervisor.  Case worker said if patient wasn't able to be transported today, that it wouldn't happen until Monday.  Informed case worker that Dr. Ellen Henri note gave recommendation to be evaluated today.

## 2018-07-31 NOTE — Telephone Encounter (Signed)
Pt said that he has already talked to his case worker and he prefers that we do not call.  Pt said that he will go to urgent care first thing in the morning.

## 2018-07-31 NOTE — Telephone Encounter (Signed)
He really needs to be seen in person for his complaints so that he can have an exam. We can not bring him in to clinic given his symptoms and if he does not want to go to the ED he could be evaluated at an urgent care so that someone can examine him. I am happy to do a follow-up visit with him by phone but he really needs more than a same day time slot for that.

## 2018-07-31 NOTE — Telephone Encounter (Addendum)
Please call the patient back and let him know that I was able to review the chest x-ray from the ED and it appears that he had a possible early infiltrate which could indicate an underlying infection or pneumonia. He really should be evaluated in person at an urgent care to determine if he could have pneumonia and make sure his vital signs are ok. They would be able to determine if he needs an antibiotic.

## 2018-08-01 NOTE — ED Notes (Addendum)
This RN informed by Arletha Grippe that police came and picked pt up while this RN in another room. Discharge vital signs or pain level were not obtained due to no notification given to this RN of police arrival.

## 2018-08-01 NOTE — ED Notes (Addendum)
Discharge instructions reviewed with APS social worker abigail grass via telephone. Ms. Justin Andrews states she sent law enforcement officers to pick pt up from emergency department.

## 2018-08-03 ENCOUNTER — Telehealth: Payer: Self-pay | Admitting: Family Medicine

## 2018-08-03 NOTE — Telephone Encounter (Signed)
Noted. He may very well need an antibiotic though he needs a visit to discuss his symptoms prior to placing him on an antibiotic. Please offer him a virtual visit with me or Lauren to discuss his chest congestion and then it can be determine if an antibiotic is appropriate for him.

## 2018-08-03 NOTE — Telephone Encounter (Signed)
lvm for Brooke with name,address and phone number for [facility] along with the walk in clinic hours. Left my direct number for her if she had any questions. Spoke to [place] and gave him all the same information. He stated that he understands.  Justin Andrews

## 2018-08-03 NOTE — Telephone Encounter (Signed)
Pt's social worker-Brooke is requesting his FL2's be sent to her asap.

## 2018-08-03 NOTE — Telephone Encounter (Signed)
Pt said that he is not doing well today.  Pt said that he has congestion in his chest and he cannot breath well.  Pt said that he cannot sleep.  Pt denies chest pain but said that he has chest tightness coming from congestion.  Pt said that he went to the ED on Friday night. Pt said that EMS transported him to the ED. Pt not sure who called EMS.  Pt said that ED r/o pneumonia and he was checked for COVID and was told it was negative.  Pt said that he was not given any antibiotic at ED and would like for Dr. Caryl Bis to call in an antibiotic to break up the congestion in his chest.  Pt said that this is urgent and that he needs Dr. Caryl Bis to called in something since he has had these symptoms for over a week.  Pt said that the last antibiotic that he was on worked well.  Advised pt to go to ED or UC if he is having urgent breathing problems today.  Pt said that he is not going back to ED or UC and that he has a doctor to call in things like this.  Will forward to PCP for review.

## 2018-08-03 NOTE — Telephone Encounter (Signed)
Pt scheduled a phone visit w/ Philis Nettle for tomorrow morning 7/28 @ 8:00 am.

## 2018-08-03 NOTE — Telephone Encounter (Signed)
Pt called to report chest congestion for 2 weeks. Requesting refill of antibiotic. Please advise if VV is necessary.  Preferred pharmacy  [id number] [address], [place] - [address]  [place] [address]  Phone: [phone] Fax: [phone]

## 2018-08-04 ENCOUNTER — Other Ambulatory Visit: Payer: Self-pay

## 2018-08-04 ENCOUNTER — Ambulatory Visit (INDEPENDENT_AMBULATORY_CARE_PROVIDER_SITE_OTHER): Payer: Medicare HMO | Admitting: Family Medicine

## 2018-08-04 DIAGNOSIS — R0989 Other specified symptoms and signs involving the circulatory and respiratory systems: Secondary | ICD-10-CM | POA: Diagnosis not present

## 2018-08-04 DIAGNOSIS — C44319 Basal cell carcinoma of skin of other parts of face: Secondary | ICD-10-CM | POA: Diagnosis not present

## 2018-08-04 DIAGNOSIS — C44329 Squamous cell carcinoma of skin of other parts of face: Secondary | ICD-10-CM | POA: Diagnosis not present

## 2018-08-04 DIAGNOSIS — Z85820 Personal history of malignant melanoma of skin: Secondary | ICD-10-CM | POA: Diagnosis not present

## 2018-08-04 DIAGNOSIS — J449 Chronic obstructive pulmonary disease, unspecified: Secondary | ICD-10-CM | POA: Diagnosis not present

## 2018-08-04 DIAGNOSIS — J988 Other specified respiratory disorders: Secondary | ICD-10-CM | POA: Diagnosis not present

## 2018-08-04 DIAGNOSIS — G4700 Insomnia, unspecified: Secondary | ICD-10-CM

## 2018-08-04 DIAGNOSIS — Z85828 Personal history of other malignant neoplasm of skin: Secondary | ICD-10-CM | POA: Diagnosis not present

## 2018-08-04 DIAGNOSIS — C4339 Malignant melanoma of other parts of face: Secondary | ICD-10-CM | POA: Diagnosis not present

## 2018-08-04 DIAGNOSIS — D485 Neoplasm of uncertain behavior of skin: Secondary | ICD-10-CM | POA: Diagnosis not present

## 2018-08-04 MED ORDER — BENZONATATE 100 MG PO CAPS: 100.0000 mg | ORAL_CAPSULE | Freq: Three times a day (TID) | ORAL | 0 refills | Status: DC | PRN

## 2018-08-04 MED ORDER — DOXYCYCLINE HYCLATE 100 MG PO TABS: 100.0000 mg | ORAL_TABLET | Freq: Two times a day (BID) | ORAL | 0 refills | Status: DC

## 2018-08-04 NOTE — Telephone Encounter (Signed)
FL2 completed. Please fax to his Education officer, [employer]. Thanks.

## 2018-08-04 NOTE — Progress Notes (Signed)
Patient ID: Justin Andrews, male   DOB: 12-20-1934, 84 y.o.   MRN: [id number]    Virtual Visit via phone Note  This visit type was conducted due to national recommendations for restrictions regarding the COVID-19 pandemic (e.g. social distancing).  This format is felt to be most appropriate for this patient at this time.  All issues noted in this document were discussed and addressed.  No physical exam was performed (except for noted visual exam findings with Video Visits).   I connected with Cain Sieve today at  8:00 AM EDT by a video enabled telemedicine application or telephone and verified that I am speaking with the correct person using two identifiers. Location patient: home Location provider: work or home office Persons participating in the virtual visit: patient, provider  I discussed the limitations, risks, security and privacy concerns of performing an evaluation and management service by telephone and the availability of in person appointments. I also discussed with the patient that there may be a patient responsible charge related to this service. The patient expressed understanding and agreed to proceed.  HPI:  Patient and I connected via telephone to discuss chest congestion and cough.  States he is coughing up green phlegm.  He did go to emergency room on 724 by ambulance due to these complaints and was tested for COVID-19.  COVID-19 test is negative.  He is also had 2 chest x-rays in the past week and I was able to review these.  Chest x-ray initially did show concern for early infection, but chest x-ray 2 days later showed resolution of those suspicious areas.   Patient does have a history of COPD and does continue to smoke.  Denies fever or chills.  Denies body aches.  Denies chest pain.  Denies any problems going to the bathroom.  Denies any nausea or vomiting.  Patient also complains of inability to sleep.  States he has been struggling this for years and cannot get his mind to  shut down at night. Denies any SI or HI.   CXR 7/22 (2 view) IMPRESSION: 1. Chronic changes of COPD. 2. Superimposed right middle lobe airspace disease is concerning for early infection. 3. Probable small pleural effusions.  CXR 7/24 (1 view) IMPRESSION: Resolution of previously seen right basilar changes consistent with previous atelectasis.  No acute abnormality noted.  ROS: See pertinent positives and negatives per HPI.  Past Medical History:  Diagnosis Date  . C. difficile diarrhea 11/2017  . Elevated serum creatinine   . History of melanoma   . Hypertension   . Osteoarthritis   . Tobacco abuse     Past Surgical History:  Procedure Laterality Date  . ANKLE FRACTURE SURGERY    . CENTRAL VENOUS CATHETER INSERTION  05/08/2018   Procedure: INSERTION CENTRAL LINE ADULT;  Surgeon: Olean Ree, MD;  Location: [facility];  Service: General;;  . ESOPHAGOGASTRODUODENOSCOPY  05/08/2018   Procedure: ESOPHAGOGASTRODUODENOSCOPY (EGD);  Surgeon: Olean Ree, MD;  Location: [facility];  Service: General;;  . ESOPHAGOGASTRODUODENOSCOPY (EGD) WITH PROPOFOL N/A 05/03/2018   Procedure: ESOPHAGOGASTRODUODENOSCOPY (EGD) WITH PROPOFOL;  Surgeon: Toledo, Benay Pike, MD;  Location: [facility];  Service: Gastroenterology;  Laterality: N/A;  . EXTERNAL EAR SURGERY    . INTRAMEDULLARY (IM) NAIL INTERTROCHANTERIC Left 12/01/2017   Procedure: INTRAMEDULLARY (IM) NAIL INTERTROCHANTRIC;  Surgeon: Thornton Park, MD;  Location: [facility];  Service: Orthopedics;  Laterality: Left;  . JEJUNOSTOMY  05/08/2018   Procedure: JEJUNOSTOMY;  Surgeon: Olean Ree, MD;  Location: [facility];  Service: General;;  . LAPAROTOMY N/A 05/08/2018   Procedure: EXPLORATORY LAPAROTOMY;  Surgeon: Olean Ree, MD;  Location: [facility];  Service: General;  Laterality: N/A;  . ROTATOR CUFF REPAIR    . TONSILLECTOMY    . TYMPANOPLASTY W/ MASTOIDECTOMY     Patient with reports of mastoid surgery (appears to have had surgery on  TM; Left).  . VISCERAL ARTERY INTERVENTION N/A 05/04/2018   Procedure: VISCERAL ARTERY INTERVENTION;  Surgeon: Algernon Huxley, MD;  Location: [facility];  Service: Cardiovascular;  Laterality: N/A;    Family History  Problem Relation Age of Onset  . Prostate cancer Neg Hx   . Bladder Cancer Neg Hx   . Kidney cancer Neg Hx    Social History   Tobacco Use  . Smoking status: Current Every Day Smoker    Packs/day: 1.00    Years: 70.00    Pack years: 70.00    Types: Cigarettes  . Smokeless tobacco: Never Used  Substance Use Topics  . Alcohol use: No    Current Outpatient Medications:  .  ALPRAZolam (XANAX) 0.25 MG tablet, Take 1 tablet (0.25 mg total) by mouth 2 (two) times daily as needed for anxiety., Disp: 12 tablet, Rfl: 0 .  calcium-vitamin D (OSCAL WITH D) 500-200 MG-UNIT tablet, Take 1 tablet by mouth 2 (two) times daily., Disp: 60 tablet, Rfl: 1 .  FEROSUL 325 (65 Fe) MG tablet, TAKE 1 TABLET BY MOUTH ONCE DAILY FOR SUPPLEMENT, Disp: 30 tablet, Rfl: 0 .  folic acid (FOLVITE) 1 MG tablet, TAKE 1 TABLET BY MOUTH DAILY, Disp: 30 tablet, Rfl: 0 .  mirtazapine (REMERON) 30 MG tablet, Take 0.5 tablets (15 mg total) by mouth at bedtime., Disp: 30 tablet, Rfl: 0 .  Nutritional Supplements (CARNATION BREAKFAST ESSENTIALS) LIQD, Take 1 Bottle by mouth 2 (two) times daily between meals., Disp: 14220 mL, Rfl: 1 .  pantoprazole (PROTONIX) 40 MG tablet, TAKE 1 TABLET DAILY AT 7:00 AM ON AN EMPTY STOMACH, Disp: 30 tablet, Rfl: 0 .  tamsulosin (FLOMAX) 0.4 MG CAPS capsule, TAKE 1 CAPSULE BY MOUTH DAILY, Disp: 30 capsule, Rfl: 0 .  thiamine 100 MG tablet, Take 1 tablet (100 mg total) by mouth daily., Disp: 30 tablet, Rfl: 1 .  traZODone (DESYREL) 100 MG tablet, Take 1 tablet (100 mg total) by mouth at bedtime., Disp: 30 tablet, Rfl: 3  EXAM:  GENERAL: alert, oriented, sounds in no acute distress  LUNGS: Speaking in full sentences, no signs of respiratory distress, breathing rate  appears normal, no obvious gross SOB, gasping, coughing or wheezing  PSYCH/NEURO: cooperative, no obvious depression or anxiety, speech and thought processing appear grossly intact. Requests medication to help him sleep at night.   ASSESSMENT AND PLAN:  Discussed the following assessment and plan:  Chest congestion, respiratory infection, COPD - due to patient reporting green phlegm with cough, chest congestion and his history of COPD and concern for possibility of respiratory infection.  Chest x-rays done last week are reassuring however his complicated past medical history prompts me to prescribe antibiotic.  We will treat him with doxycycline twice daily for 10 days.  He will also trial Tessalon Perles needs to see if this helps cough.  Offered albuterol inhaler to have around in case of need, but he declines this prescription.  Insomnia - patient asking for something to help him sleep at night and knock him out.  Advised patient that I cannot prescribe anything else on top of the current medications  he takes for his mental health and for sleep.  Patient is supposed to go to the [facility] for evaluation, we reminded him of this he states he knows he has to go to [place] and plans to go soon.    I discussed the assessment and treatment plan with the patient. The patient was provided an opportunity to ask questions and all were answered. The patient agreed with the plan and demonstrated an understanding of the instructions.   The patient was advised to call back or seek an in-person evaluation if the symptoms worsen or if the condition fails to improve as anticipated.  15 minutes spent on phone call with patient discussing symptoms, treatment plan, and reminding him of the need for him to go to [facility] for evaluation as discussed with his PCP  Jodelle Green, FNP

## 2018-08-05 ENCOUNTER — Other Ambulatory Visit: Payer: Medicare HMO

## 2018-08-05 ENCOUNTER — Encounter: Payer: Self-pay | Admitting: *Deleted

## 2018-08-05 ENCOUNTER — Ambulatory Visit: Payer: Self-pay | Admitting: Ophthalmology

## 2018-08-05 DIAGNOSIS — H25041 Posterior subcapsular polar age-related cataract, right eye: Secondary | ICD-10-CM | POA: Diagnosis not present

## 2018-08-05 DIAGNOSIS — I10 Essential (primary) hypertension: Secondary | ICD-10-CM | POA: Diagnosis not present

## 2018-08-10 ENCOUNTER — Telehealth: Payer: Self-pay | Admitting: Family Medicine

## 2018-08-10 ENCOUNTER — Other Ambulatory Visit
Admission: RE | Admit: 2018-08-10 | Discharge: 2018-08-10 | Disposition: A | Discharge status: Home / Self Care | Payer: Medicare HMO | Source: Ambulatory Visit | Attending: Ophthalmology | Admitting: Ophthalmology

## 2018-08-10 ENCOUNTER — Other Ambulatory Visit: Payer: Self-pay

## 2018-08-10 DIAGNOSIS — Z20828 Contact with and (suspected) exposure to other viral communicable diseases: Secondary | ICD-10-CM | POA: Insufficient documentation

## 2018-08-10 DIAGNOSIS — Z01812 Encounter for preprocedural laboratory examination: Secondary | ICD-10-CM | POA: Insufficient documentation

## 2018-08-10 DIAGNOSIS — H25041 Posterior subcapsular polar age-related cataract, right eye: Secondary | ICD-10-CM | POA: Diagnosis not present

## 2018-08-10 LAB — SARS CORONAVIRUS 2 (TAT 6-24 HRS): SARS Coronavirus 2: NEGATIVE

## 2018-08-10 NOTE — Telephone Encounter (Signed)
Please contact the patient and see if he is feeling better from his recent respiratory illness that Lauren treated with doxycycline. Thanks.

## 2018-08-10 NOTE — Telephone Encounter (Signed)
Done. Justin Andrews,cma  

## 2018-08-11 ENCOUNTER — Telehealth (HOSPITAL_COMMUNITY): Payer: Self-pay | Admitting: Psychiatry

## 2018-08-11 NOTE — Telephone Encounter (Signed)
D:  Pt was referred by Jonelle Sidle (referral coordinator) to [place].  A:  Placed call to orient pt and provide him with a start date.  Patient states he is not interested in any kind of group at this time.  Inform Jonelle Sidle.

## 2018-08-12 DIAGNOSIS — H25041 Posterior subcapsular polar age-related cataract, right eye: Secondary | ICD-10-CM | POA: Diagnosis not present

## 2018-08-12 NOTE — Telephone Encounter (Signed)
Called and spoke to patient and he states he is feeling much better.  Julanne Schlueter,cma

## 2018-08-12 NOTE — Telephone Encounter (Signed)
Noted. Good to hear. Please fax surgical clearance that is in the signed folder.

## 2018-08-13 ENCOUNTER — Encounter: Payer: Self-pay | Admitting: Certified Registered"

## 2018-08-13 ENCOUNTER — Ambulatory Visit
Admission: RE | Admit: 2018-08-13 | Discharge: 2018-08-13 | Disposition: A | Discharge status: Home / Self Care | Payer: Medicare HMO | Attending: Ophthalmology | Admitting: Ophthalmology

## 2018-08-13 ENCOUNTER — Other Ambulatory Visit: Payer: Self-pay

## 2018-08-13 ENCOUNTER — Encounter: Payer: Self-pay | Admitting: Anesthesiology

## 2018-08-13 ENCOUNTER — Ambulatory Visit: Payer: Self-pay | Admitting: Certified Registered"

## 2018-08-13 ENCOUNTER — Encounter
Admission: RE | Disposition: A | Discharge status: Home / Self Care | Payer: Self-pay | Source: Home / Self Care | Attending: Ophthalmology

## 2018-08-13 DIAGNOSIS — H2511 Age-related nuclear cataract, right eye: Secondary | ICD-10-CM | POA: Insufficient documentation

## 2018-08-13 DIAGNOSIS — K219 Gastro-esophageal reflux disease without esophagitis: Secondary | ICD-10-CM | POA: Insufficient documentation

## 2018-08-13 DIAGNOSIS — Z85820 Personal history of malignant melanoma of skin: Secondary | ICD-10-CM | POA: Diagnosis not present

## 2018-08-13 DIAGNOSIS — Z79899 Other long term (current) drug therapy: Secondary | ICD-10-CM | POA: Diagnosis not present

## 2018-08-13 DIAGNOSIS — R69 Illness, unspecified: Secondary | ICD-10-CM | POA: Diagnosis not present

## 2018-08-13 DIAGNOSIS — N189 Chronic kidney disease, unspecified: Secondary | ICD-10-CM | POA: Diagnosis not present

## 2018-08-13 DIAGNOSIS — F329 Major depressive disorder, single episode, unspecified: Secondary | ICD-10-CM | POA: Diagnosis not present

## 2018-08-13 DIAGNOSIS — I129 Hypertensive chronic kidney disease with stage 1 through stage 4 chronic kidney disease, or unspecified chronic kidney disease: Secondary | ICD-10-CM | POA: Diagnosis not present

## 2018-08-13 DIAGNOSIS — F17200 Nicotine dependence, unspecified, uncomplicated: Secondary | ICD-10-CM | POA: Insufficient documentation

## 2018-08-13 DIAGNOSIS — J439 Emphysema, unspecified: Secondary | ICD-10-CM | POA: Insufficient documentation

## 2018-08-13 DIAGNOSIS — N183 Chronic kidney disease, stage 3 (moderate): Secondary | ICD-10-CM | POA: Diagnosis not present

## 2018-08-13 HISTORY — DX: Depression, unspecified: F32.A

## 2018-08-13 HISTORY — DX: Gastro-esophageal reflux disease without esophagitis: K21.9

## 2018-08-13 HISTORY — PX: CATARACT EXTRACTION W/PHACO: SHX586

## 2018-08-13 SURGERY — PHACOEMULSIFICATION, CATARACT, WITH IOL INSERTION
Anesthesia: Monitor Anesthesia Care | Site: Eye | Laterality: Right

## 2018-08-13 MED ORDER — MOXIFLOXACIN HCL 0.5 % OP SOLN
OPHTHALMIC | Status: DC | PRN
  Administered 2018-08-13: .2 mL via OPHTHALMIC

## 2018-08-13 MED ORDER — LIDOCAINE HCL (PF) 4 % IJ SOLN
INTRAOCULAR | Status: DC | PRN
  Administered 2018-08-13: 2.25 mL via OPHTHALMIC

## 2018-08-13 MED ORDER — FENTANYL CITRATE (PF) 100 MCG/2ML IJ SOLN
INTRAMUSCULAR | Status: AC
  Filled 2018-08-13: qty 2

## 2018-08-13 MED ORDER — MIDAZOLAM HCL 2 MG/2ML IJ SOLN
INTRAMUSCULAR | Status: DC | PRN
  Administered 2018-08-13: 1 mg via INTRAVENOUS

## 2018-08-13 MED ORDER — PROPOFOL 10 MG/ML IV BOLUS
INTRAVENOUS | Status: AC
  Filled 2018-08-13: qty 20

## 2018-08-13 MED ORDER — EPINEPHRINE PF 1 MG/ML IJ SOLN
INTRAMUSCULAR | Status: AC
  Filled 2018-08-13: qty 1

## 2018-08-13 MED ORDER — MIDAZOLAM HCL 2 MG/2ML IJ SOLN
INTRAMUSCULAR | Status: AC
  Filled 2018-08-13: qty 2

## 2018-08-13 MED ORDER — ARMC OPHTHALMIC DILATING DROPS
1.0000 "application " | OPHTHALMIC | Status: AC
  Administered 2018-08-13 (×2): 1 via OPHTHALMIC

## 2018-08-13 MED ORDER — TETRACAINE HCL 0.5 % OP SOLN
1.0000 [drp] | OPHTHALMIC | Status: DC | PRN
  Administered 2018-08-13: 1 [drp] via OPHTHALMIC

## 2018-08-13 MED ORDER — PHENYLEPHRINE HCL 10 % OP SOLN: 1.0000 [drp] | Freq: Once | OPHTHALMIC | Status: DC

## 2018-08-13 MED ORDER — ONDANSETRON HCL 4 MG/2ML IJ SOLN
INTRAMUSCULAR | Status: AC
  Filled 2018-08-13: qty 2

## 2018-08-13 MED ORDER — NA HYALUR & NA CHOND-NA HYALUR 0.55-0.5 ML IO KIT
PACK | INTRAOCULAR | Status: AC
  Filled 2018-08-13: qty 1.05

## 2018-08-13 MED ORDER — POVIDONE-IODINE 5 % OP SOLN
OPHTHALMIC | Status: DC | PRN
  Administered 2018-08-13: 1 via OPHTHALMIC

## 2018-08-13 MED ORDER — POVIDONE-IODINE 5 % OP SOLN
OPHTHALMIC | Status: AC
  Filled 2018-08-13: qty 30

## 2018-08-13 MED ORDER — NA CHONDROIT SULF-NA HYALURON 40-17 MG/ML IO SOLN
INTRAOCULAR | Status: DC | PRN
  Administered 2018-08-13: 1 mL via INTRAOCULAR

## 2018-08-13 MED ORDER — SODIUM CHLORIDE 0.9 % IV SOLN
INTRAVENOUS | Status: DC
  Administered 2018-08-13: 12:00:00 via INTRAVENOUS

## 2018-08-13 MED ORDER — CYCLOPENTOLATE HCL 2 % OP SOLN: 1.0000 [drp] | Freq: Once | OPHTHALMIC | Status: DC

## 2018-08-13 MED ORDER — NA HYALUR & NA CHOND-NA HYALUR 0.55-0.5 ML IO KIT
PACK | INTRAOCULAR | Status: DC | PRN
  Administered 2018-08-13: 1 mL via OPHTHALMIC

## 2018-08-13 MED ORDER — TETRACAINE HCL 0.5 % OP SOLN
OPHTHALMIC | Status: AC
  Administered 2018-08-13: 11:00:00 1 [drp] via OPHTHALMIC
  Filled 2018-08-13: qty 4

## 2018-08-13 MED ORDER — NA CHONDROIT SULF-NA HYALURON 40-17 MG/ML IO SOLN
INTRAOCULAR | Status: AC
  Filled 2018-08-13: qty 1

## 2018-08-13 MED ORDER — EPINEPHRINE PF 1 MG/ML IJ SOLN
INTRAOCULAR | Status: DC | PRN
  Administered 2018-08-13: 1 mL via OPHTHALMIC

## 2018-08-13 MED ORDER — MOXIFLOXACIN HCL 0.5 % OP SOLN: 1.0000 [drp] | OPHTHALMIC | Status: DC | PRN

## 2018-08-13 MED ORDER — LIDOCAINE HCL (PF) 4 % IJ SOLN
INTRAMUSCULAR | Status: AC
  Filled 2018-08-13: qty 5

## 2018-08-13 MED ORDER — CARBACHOL 0.01 % IO SOLN
INTRAOCULAR | Status: DC | PRN
  Administered 2018-08-13: .5 mL via INTRAOCULAR

## 2018-08-13 MED ORDER — DORZOLAMIDE HCL-TIMOLOL MAL 2-0.5 % OP SOLN
OPHTHALMIC | Status: DC | PRN
  Administered 2018-08-13: 1 [drp] via OPHTHALMIC

## 2018-08-13 MED ORDER — ONDANSETRON HCL 4 MG/2ML IJ SOLN
INTRAMUSCULAR | Status: DC | PRN
  Administered 2018-08-13: 4 mg via INTRAVENOUS

## 2018-08-13 MED ORDER — TRYPAN BLUE 0.06 % OP SOLN
OPHTHALMIC | Status: DC | PRN
  Administered 2018-08-13: .5 mL via INTRAOCULAR

## 2018-08-13 MED ORDER — MOXIFLOXACIN HCL 0.5 % OP SOLN
OPHTHALMIC | Status: AC
  Filled 2018-08-13: qty 3

## 2018-08-13 MED ORDER — NEOMYCIN-POLYMYXIN-DEXAMETH 3.5-10000-0.1 OP OINT
TOPICAL_OINTMENT | OPHTHALMIC | Status: DC | PRN
  Administered 2018-08-13: 1 via OPHTHALMIC

## 2018-08-13 MED ORDER — ARMC OPHTHALMIC DILATING DROPS
OPHTHALMIC | Status: AC
  Administered 2018-08-13: 1 via OPHTHALMIC
  Filled 2018-08-13: qty 0.5

## 2018-08-13 SURGICAL SUPPLY — 17 items
DISSECTOR HYDRO NUCLEUS 50X22 (MISCELLANEOUS) ×8 IMPLANT
DRSG TEGADERM 2-3/8X2-3/4 SM (GAUZE/BANDAGES/DRESSINGS) ×2 IMPLANT
GLOVE BIOGEL M 6.5 STRL (GLOVE) ×2 IMPLANT
GOWN STRL REUS W/ TWL LRG LVL3 (GOWN DISPOSABLE) ×1 IMPLANT
GOWN STRL REUS W/ TWL XL LVL3 (GOWN DISPOSABLE) ×1 IMPLANT
GOWN STRL REUS W/TWL LRG LVL3 (GOWN DISPOSABLE) ×2
GOWN STRL REUS W/TWL XL LVL3 (GOWN DISPOSABLE) ×2
KNIFE 45D UP 2.3 (MISCELLANEOUS) ×2 IMPLANT
LABEL CATARACT MEDS ST (LABEL) ×2 IMPLANT
LENS IOL ACRYSOF IQ 21.0 (Intraocular Lens) ×2 IMPLANT
PACK CATARACT (MISCELLANEOUS) ×2 IMPLANT
PACK CATARACT KING (MISCELLANEOUS) ×2 IMPLANT
PACK EYE AFTER SURG (MISCELLANEOUS) ×2 IMPLANT
SOL BSS BAG (MISCELLANEOUS) ×2
SOLUTION BSS BAG (MISCELLANEOUS) ×1 IMPLANT
WATER STERILE IRR 250ML POUR (IV SOLUTION) ×2 IMPLANT
WIPE NON LINTING 3.25X3.25 (MISCELLANEOUS) ×2 IMPLANT

## 2018-08-13 NOTE — Discharge Instructions (Addendum)
Eye Surgery Discharge Instructions  Expect mild scratchy sensation or mild soreness. DO NOT RUB YOUR EYE!  The day of surgery:  Minimal physical activity, but bed rest is not required  No reading, computer work, or close hand work  No bending, lifting, or straining.  May watch TV  For 24 hours:  No driving, legal decisions, or alcoholic beverages  Safety precautions  Eat anything you prefer: It is better to start with liquids, then soup then solid foods.  Keep patch in place - do not remove  Resume all regular medications including aspirin or Coumadin if these were discontinued prior to surgery. You may shower, bathe, shave, or wash your hair. Tylenol may be taken for mild discomfort.  Call your doctor if you experience significant pain, nausea, or vomiting, fever > 101 or other signs of infection. [phone] or [phone] Specific instructions:  Follow-up Information    Marchia Meiers, MD Follow up.   Specialty: Ophthalmology Why: 08/14/18 @ 9:30 am Contact information: [address] [place] [address] [phone]

## 2018-08-13 NOTE — Progress Notes (Signed)
Dr  Neville Route received a verbal ok from [place] via telephone to start giving dilating eye drops in right eye.

## 2018-08-13 NOTE — Anesthesia Post-op Follow-up Note (Signed)
Anesthesia QCDR form completed.        

## 2018-08-13 NOTE — Anesthesia Postprocedure Evaluation (Signed)
Anesthesia Post Note  Patient: Justin Andrews  Procedure(s) Performed: CATARACT EXTRACTION PHACO AND INTRAOCULAR LENS PLACEMENT (IOC) RIGHT MALYUGIN VISION BLUE (Right Eye)  Patient location during evaluation: PACU Anesthesia Type: MAC Level of consciousness: awake and alert Pain management: pain level controlled Vital Signs Assessment: post-procedure vital signs reviewed and stable Respiratory status: spontaneous breathing, nonlabored ventilation, respiratory function stable and patient connected to nasal cannula oxygen Cardiovascular status: stable and blood pressure returned to baseline Postop Assessment: no apparent nausea or vomiting Anesthetic complications: no     Last Vitals:  Vitals:   08/13/18 1002 08/13/18 1231  BP: 128/79 (!) 145/82  Pulse: 92 90  Resp: 16 16  Temp: 36.4 C 36.6 C  SpO2: 100% 95%    Last Pain:  Vitals:   08/13/18 1231  TempSrc: Temporal  PainSc: 0-No pain                 Martha Clan

## 2018-08-13 NOTE — Op Note (Signed)
  PREOPERATIVE DIAGNOSIS:  Nuclear sclerotic cataract of the RIGHT eye.   POSTOPERATIVE DIAGNOSIS:  Nuclear sclerotic cataract of the RIGHT eye.   OPERATIVE PROCEDURE: Cataract surgery OD   SURGEON:  Marchia Meiers, MD.   ANESTHESIA:  Anesthesiologist: Martha Clan, MD CRNA: Kelton Pillar, CRNA  1.      Managed anesthesia care. 2.     0.73ml of Shugarcaine was instilled following the paracentesis   COMPLICATIONS:  None.   TECHNIQUE:   Divide and conquer   DESCRIPTION OF PROCEDURE:  The patient was examined and consented in the preoperative holding area where the aforementioned topical anesthesia was applied to the RIGHT eye and then brought back to the Operating Room where the RIGHT eye was prepped and draped in the usual sterile ophthalmic fashion and a lid speculum was placed. A paracentesis was created with the side port blade, the anterior chamber was washed out with trypan blue to stain the anterior capsule, and the anterior chamber was filled with viscoelastic. A near clear corneal incision was performed with the steel keratome. A continuous curvilinear capsulorrhexis was performed with a cystotome followed by the capsulorrhexis forceps. Hydrodissection and hydrodelineation were carried out with BSS on a blunt cannula. The lens was removed in a divide and conquer  technique and the remaining cortical material was removed with the irrigation-aspiration handpiece. The capsular bag was inflated with viscoelastic and the lens was placed in the capsular bag without complication. The remaining viscoelastic was removed from the eye with the irrigation-aspiration handpiece. The wounds were hydrated. The anterior chamber was flushed and the eye was inflated to physiologic pressure. 0.34ml Vigamox was placed in the anterior chamber. The wounds were found to be water tight. The eye was dressed with Vigamox. The patient was given protective glasses to wear throughout the day and a shield with  which to sleep tonight. The patient was also given drops with which to begin a drop regimen today and will follow-up with me in one day. Implant Name Type Inv. Item Serial No. Manufacturer Lot No. LRB No. Used Action  LENS IOL ACRYSOF IQ 21.0 - [id number] Intraocular Lens LENS IOL ACRYSOF IQ 21.0 [id number] [employer]CON  Right 1 Implanted    Procedure(s) with comments: CATARACT EXTRACTION PHACO AND INTRAOCULAR LENS PLACEMENT (IOC) RIGHT MALYUGIN VISION BLUE (Right) - [place]  01:16 CDE 13.15 Fluid pack lot # [id number]  Electronically signed: Marchia Meiers 08/13/2018 12:41 PM

## 2018-08-13 NOTE — H&P (Signed)
   I have reviewed the patient's H&P and agree with its findings. There have been no interval changes.  Tamicka Shimon MD Ophthalmology 

## 2018-08-13 NOTE — Transfer of Care (Signed)
Immediate Anesthesia Transfer of Care Note  Patient: Justin Andrews  Procedure(s) Performed: CATARACT EXTRACTION PHACO AND INTRAOCULAR LENS PLACEMENT (IOC) RIGHT [place] (Right Eye) 2 Patient Location: PACU  Anesthesia Type:MAC  Level of Consciousness: awake, alert  and patient cooperative  Airway & Oxygen Therapy: Patient Spontanous Breathing  Post-op Assessment: Report given to RN and Post -op Vital signs reviewed and stable  Post vital signs: Reviewed and stable  Last Vitals:  Vitals Value Taken Time  BP    Temp    Pulse    Resp    SpO2      Last Pain:  Vitals:   08/13/18 1002  TempSrc: Temporal  PainSc: 0-No pain         Complications: No apparent anesthesia complications

## 2018-08-13 NOTE — Anesthesia Preprocedure Evaluation (Signed)
Anesthesia Evaluation  Patient identified by MRN, date of birth, ID band Patient awake    Reviewed: Allergy & Precautions, H&P , NPO status , Patient's Chart, lab work & pertinent test results, reviewed documented beta blocker date and time   History of Anesthesia Complications Negative for: history of anesthetic complications  Airway Mallampati: II   Neck ROM: full    Dental  (+) Poor Dentition, Dental Advidsory Given   Pulmonary neg shortness of breath, neg COPD, neg recent URI, Current Smoker,           Cardiovascular Exercise Tolerance: Poor hypertension, On Medications (-) angina+ DOE  (-) Past MI and (-) Cardiac Stents negative cardio ROS Normal cardiovascular exam(-) dysrhythmias (-) Valvular Problems/Murmurs     Neuro/Psych PSYCHIATRIC DISORDERS Anxiety Depression Dementia and confusion baseline history with ETOH abuse.  negative neurological ROS  negative psych ROS   GI/Hepatic negative GI ROS, (+) Cirrhosis       ,   Endo/Other  negative endocrine ROS  Renal/GU CRFRenal disease  negative genitourinary   Musculoskeletal   Abdominal   Peds  Hematology negative hematology ROS (+) Blood dyscrasia, anemia ,   Anesthesia Other Findings Past Medical History: No date: Elevated serum creatinine No date: History of melanoma No date: Hypertension No date: Osteoarthritis No date: Tobacco abuse Past Surgical History: No date: ANKLE FRACTURE SURGERY No date: EXTERNAL EAR SURGERY No date: ROTATOR CUFF REPAIR No date: TONSILLECTOMY No date: TYMPANOPLASTY W/ MASTOIDECTOMY     Comment:  Patient with reports of mastoid surgery (appears to have              had surgery on TM; Left). BMI    Body Mass Index:  21.74 kg/m     Reproductive/Obstetrics negative OB ROS                             Anesthesia Physical  Anesthesia Plan  ASA: IV  Anesthesia Plan: MAC   Post-op Pain  Management:    Induction: Intravenous  PONV Risk Score and Plan: 1 and Ondansetron and Midazolam  Airway Management Planned: Natural Airway and Nasal Cannula  Additional Equipment:   Intra-op Plan:   Post-operative Plan:   Informed Consent: I have reviewed the patients History and Physical, chart, labs and discussed the procedure including the risks, benefits and alternatives for the proposed anesthesia with the patient or authorized representative who has indicated his/her understanding and acceptance.     Dental Advisory Given  Plan Discussed with: CRNA  Anesthesia Plan Comments:         Anesthesia Quick Evaluation

## 2018-08-14 ENCOUNTER — Encounter: Payer: Self-pay | Admitting: Ophthalmology

## 2018-08-17 ENCOUNTER — Encounter: Payer: Self-pay | Admitting: Emergency Medicine

## 2018-08-17 ENCOUNTER — Emergency Department
Admission: EM | Admit: 2018-08-17 | Discharge: 2018-08-17 | Disposition: A | Discharge status: Home / Self Care | Payer: Medicare HMO | Attending: Emergency Medicine | Admitting: Emergency Medicine

## 2018-08-17 ENCOUNTER — Ambulatory Visit: Payer: Self-pay | Admitting: *Deleted

## 2018-08-17 ENCOUNTER — Other Ambulatory Visit: Payer: Self-pay

## 2018-08-17 ENCOUNTER — Emergency Department: Payer: Medicare HMO

## 2018-08-17 DIAGNOSIS — R0902 Hypoxemia: Secondary | ICD-10-CM | POA: Diagnosis not present

## 2018-08-17 DIAGNOSIS — Y999 Unspecified external cause status: Secondary | ICD-10-CM | POA: Diagnosis not present

## 2018-08-17 DIAGNOSIS — Y9301 Activity, walking, marching and hiking: Secondary | ICD-10-CM | POA: Insufficient documentation

## 2018-08-17 DIAGNOSIS — I129 Hypertensive chronic kidney disease with stage 1 through stage 4 chronic kidney disease, or unspecified chronic kidney disease: Secondary | ICD-10-CM | POA: Diagnosis not present

## 2018-08-17 DIAGNOSIS — R609 Edema, unspecified: Secondary | ICD-10-CM | POA: Diagnosis not present

## 2018-08-17 DIAGNOSIS — F17210 Nicotine dependence, cigarettes, uncomplicated: Secondary | ICD-10-CM | POA: Insufficient documentation

## 2018-08-17 DIAGNOSIS — R000 Tachycardia, unspecified: Secondary | ICD-10-CM | POA: Diagnosis not present

## 2018-08-17 DIAGNOSIS — W19XXXA Unspecified fall, initial encounter: Secondary | ICD-10-CM | POA: Diagnosis not present

## 2018-08-17 DIAGNOSIS — M7989 Other specified soft tissue disorders: Secondary | ICD-10-CM | POA: Diagnosis not present

## 2018-08-17 DIAGNOSIS — Y929 Unspecified place or not applicable: Secondary | ICD-10-CM | POA: Insufficient documentation

## 2018-08-17 DIAGNOSIS — Z85828 Personal history of other malignant neoplasm of skin: Secondary | ICD-10-CM | POA: Diagnosis not present

## 2018-08-17 DIAGNOSIS — N183 Chronic kidney disease, stage 3 (moderate): Secondary | ICD-10-CM | POA: Diagnosis not present

## 2018-08-17 DIAGNOSIS — R69 Illness, unspecified: Secondary | ICD-10-CM | POA: Diagnosis not present

## 2018-08-17 DIAGNOSIS — S93492A Sprain of other ligament of left ankle, initial encounter: Secondary | ICD-10-CM | POA: Diagnosis not present

## 2018-08-17 DIAGNOSIS — S99912A Unspecified injury of left ankle, initial encounter: Secondary | ICD-10-CM | POA: Diagnosis not present

## 2018-08-17 DIAGNOSIS — S93402A Sprain of unspecified ligament of left ankle, initial encounter: Secondary | ICD-10-CM | POA: Diagnosis not present

## 2018-08-17 DIAGNOSIS — W010XXA Fall on same level from slipping, tripping and stumbling without subsequent striking against object, initial encounter: Secondary | ICD-10-CM | POA: Diagnosis not present

## 2018-08-17 DIAGNOSIS — R52 Pain, unspecified: Secondary | ICD-10-CM | POA: Diagnosis not present

## 2018-08-17 DIAGNOSIS — M25572 Pain in left ankle and joints of left foot: Secondary | ICD-10-CM | POA: Diagnosis not present

## 2018-08-17 MED ORDER — ACETAMINOPHEN 325 MG PO TABS
650.0000 mg | ORAL_TABLET | Freq: Once | ORAL | Status: AC
  Administered 2018-08-17: 19:00:00 650 mg via ORAL
  Filled 2018-08-17: qty 2

## 2018-08-17 NOTE — ED Provider Notes (Signed)
[facility] Emergency Department Provider Note ____________________________________________  Time seen: Approximately 5:30 PM  I have reviewed the triage vital signs and the nursing notes.   HISTORY  Chief Complaint No chief complaint on file.    HPI Justin Andrews is a 84 y.o. male who presents to the emergency department for evaluation and treatment of left ankle pain after mechanical, non-syncopal fall 4 days ago.  Patient states that he was using his walker and was depending on it too much.  He states the walker slipped and he fell.  He denies striking his head or having any loss of consciousness.  No alleviating measures have been attempted prior to arrival.  Past Medical History:  Diagnosis Date  . C. difficile diarrhea 11/2017  . Cancer ([place])    skin  . Depression   . Elevated serum creatinine   . GERD (gastroesophageal reflux disease)   . History of melanoma   . Hypertension   . Osteoarthritis   . Tobacco abuse     Patient Active Problem List   Diagnosis Date Noted  . Encounter for competency evaluation 07/28/2018  . History of Clostridioides difficile infection 07/17/2018  . Insufficient social support 06/27/2018  . Abdominal pain 05/28/2018  . GI bleed 05/02/2018  . Hemorrhagic shock ([place]) 05/02/2018  . Altered mental status 05/02/2018  . Anemia due to blood loss, acute 05/02/2018  . RLS (restless legs syndrome) 04/22/2018  . Wrist pain 04/22/2018  . Chronic hip pain 04/08/2018  . Suicidal ideation 03/19/2018  . Protein-calorie malnutrition, severe 12/03/2017  . Closed left hip fracture ([place]) 11/30/2017  . Diarrhea 11/15/2017  . Duodenum disorder 11/15/2017  . Frequency of urination 10/21/2017  . Cigarette smoker 10/06/2017  . Cough 09/25/2017  . Changes in vision 09/25/2017  . Proteinuria 09/25/2017  . Basal cell carcinoma 02/28/2017  . 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 DOE (dyspnea on exertion) 01/17/2017  . Chronic  right upper quadrant pain 11/15/2016  . Balance problem 11/15/2016  . Simple renal cyst 10/21/2016  . Prostate nodule 10/16/2016  . Dysuria 10/16/2016  . CKD (chronic kidney disease) stage 3, GFR 30-59 ml/min (HCC) 08/05/2016  . Anemia 08/05/2016  . Anxiety and depression 08/05/2016  . Weight loss 08/05/2016  . Sleep disturbance 07/23/2016  . Hypertension   . Osteoarthritis   . Tobacco abuse   . 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[place]) 03/06/2016    Past Surgical History:  Procedure Laterality Date  . ANKLE FRACTURE SURGERY    . CATARACT EXTRACTION W/PHACO Right 08/13/2018   Procedure: CATARACT EXTRACTION PHACO AND INTRAOCULAR LENS PLACEMENT ([place]) RIGHT [place];  Surgeon: Marchia Meiers, MD;  Location: [facility] ORS;  Service: Ophthalmology;  Laterality: Right;  [place]  01:16 CDE 13.15 Fluid pack lot # [id number]  . CENTRAL VENOUS CATHETER INSERTION  05/08/2018   Procedure: INSERTION CENTRAL LINE ADULT;  Surgeon: Olean Ree, MD;  Location: [facility] ORS;  Service: General;;  . ESOPHAGOGASTRODUODENOSCOPY  05/08/2018   Procedure: ESOPHAGOGASTRODUODENOSCOPY (EGD);  Surgeon: Olean Ree, MD;  Location: [facility] ORS;  Service: General;;  . ESOPHAGOGASTRODUODENOSCOPY (EGD) WITH PROPOFOL N/A 05/03/2018   Procedure: ESOPHAGOGASTRODUODENOSCOPY (EGD) WITH PROPOFOL;  Surgeon: Toledo, Benay Pike, MD;  Location: [facility] ENDOS[facility];  Service: Gastroenterology;  Laterality: N/A;  . EXTERNAL EAR SURGERY    . INTRAMEDULLARY (IM) NAIL INTERTROCHANTERIC Left 12/01/2017   Procedure: INTRAMEDULLARY (IM) NAIL INTERTROCHANTRIC;  Surgeon: Thornton Park, MD;  Location: [facility] ORS;  Service: Orthopedics;  Laterality: Left;  . JEJUNOSTOMY  05/08/2018   Procedure: Shanon Rosser;  Surgeon: Olean Ree, MD;  Location: [facility] ORS;  Service: General;;  . LAPAROTOMY N/A 05/08/2018   Procedure: EXPLORATORY LAPAROTOMY;  Surgeon: Olean Ree, MD;  Location: [facility] ORS;  Service: General;  Laterality: N/A;  . ROTATOR CUFF REPAIR    .  TONSILLECTOMY    . TYMPANOPLASTY W/ MASTOIDECTOMY     Patient with reports of mastoid surgery (appears to have had surgery on TM; Left).  . VISCERAL ARTERY INTERVENTION N/A 05/04/2018   Procedure: VISCERAL ARTERY INTERVENTION;  Surgeon: Algernon Huxley, MD;  Location: [place] [facility];  Service: Cardiovascular;  Laterality: N/A;    Prior to Admission medications   Medication Sig Start Date End Date Taking? Authorizing Provider  ALPRAZolam (XANAX) 0.25 MG tablet Take 1 tablet (0.25 mg total) by mouth 2 (two) times daily as needed for anxiety. Patient not taking: Reported on 08/07/2018 07/14/18   Leone Haven, MD  calcium-vitamin D (OSCAL WITH D) 500-200 MG-UNIT tablet Take 1 tablet by mouth 2 (two) times daily. 07/16/18   Leone Haven, MD  doxycycline (VIBRA-TABS) 100 MG tablet Take 1 tablet (100 mg total) by mouth 2 (two) times daily. 08/04/18   Guse, Jacquelynn Cree, FNP  FEROSUL 325 (65 Fe) MG tablet TAKE 1 TABLET BY MOUTH ONCE DAILY FOR SUPPLEMENT Patient taking differently: Take 325 mg by mouth daily.  07/14/18   Leone Haven, MD  folic acid (FOLVITE) 1 MG tablet TAKE 1 TABLET BY MOUTH DAILY Patient taking differently: Take 1 mg by mouth daily.  07/14/18   Leone Haven, MD  mirtazapine (REMERON) 30 MG tablet Take 0.5 tablets (15 mg total) by mouth at bedtime. 06/17/18   Loletha Grayer, MD  Nutritional Supplements (CARNATION BREAKFAST ESSENTIALS) LIQD Take 1 Bottle by mouth 2 (two) times daily between meals. Patient not taking: Reported on 08/07/2018 07/17/18   Leone Haven, MD  pantoprazole (PROTONIX) 40 MG tablet TAKE 1 TABLET DAILY AT 7:00 AM ON AN EMPTY STOMACH Patient taking differently: Take 40 mg by mouth daily.  07/14/18   Leone Haven, MD  tamsulosin (FLOMAX) 0.4 MG CAPS capsule TAKE 1 CAPSULE BY MOUTH DAILY Patient taking differently: Take 0.4 mg by mouth daily.  07/14/18   Leone Haven, MD  thiamine 100 MG tablet Take 1 tablet (100 mg total) by mouth daily.  07/16/18   Leone Haven, MD  traZODone (DESYREL) 100 MG tablet Take 1 tablet (100 mg total) by mouth at bedtime. Patient not taking: Reported on 08/07/2018 07/27/18   Leone Haven, MD    Allergies Aspirin  Family History  Problem Relation Age of Onset  . Prostate cancer Neg Hx   . Bladder Cancer Neg Hx   . Kidney cancer Neg Hx     Social History Social History   Tobacco Use  . Smoking status: Current Every Day Smoker    Packs/day: 1.00    Years: 70.00    Pack years: 70.00    Types: Cigarettes  . Smokeless tobacco: Never Used  Substance Use Topics  . Alcohol use: No  . Drug use: No    Review of Systems Constitutional: Negative for fever. Cardiovascular: Negative for chest pain. Respiratory: Negative for shortness of breath. Musculoskeletal: Positive for left ankle pain. Skin: Positive for bruising over the left ankle and foot Neurological: Negative for decrease in sensation  ____________________________________________   PHYSICAL EXAM:  VITAL SIGNS: ED Triage Vitals  Enc Vitals Group  BP 08/17/18 1719 114/78     Pulse Rate 08/17/18 1719 70     Resp 08/17/18 1719 18     Temp 08/17/18 1719 98.7 F (37.1 C)     Temp Source 08/17/18 1719 Oral     SpO2 --      Weight 08/17/18 1720 125 lb (56.7 kg)     Height 08/17/18 1720 5\' 9"  (1.753 m)     Head Circumference --      Peak Flow --      Pain Score 08/17/18 1720 4     Pain Loc --      Pain Edu? --      Excl. in [place]? --     Constitutional: Alert and oriented. Well appearing and in no acute distress. Eyes: Conjunctivae are clear without discharge or drainage Head: Atraumatic Neck: Supple.  No focal midline tenderness. Respiratory: No cough. Respirations are even and unlabored. Musculoskeletal: Patient able to flex and extend left ankle and move his toes. Neurologic: Motor and sensory function is intact, specifically of the left lower extremity. Skin: Diffuse ecchymosis noted over the left foot and  ankle. Psychiatric: Affect and behavior are appropriate.  ____________________________________________   LABS (all labs ordered are listed, but only abnormal results are displayed)  Labs Reviewed - No data to display ____________________________________________  RADIOLOGY  Image of the left ankle shows no acute bony abnormality. ____________________________________________   PROCEDURES  .Ortho Injury Treatment  Date/Time: 08/17/2018 6:42 PM Performed by: Victorino Dike, FNP Authorized by: Victorino Dike, FNP   Consent:    Consent obtained:  Verbal   Consent given by:  Patient and guardian   Risks discussed:  Restricted joint movement and stiffnessInjury location: ankle Injury type: soft tissue Pre-procedure distal perfusion: normal Pre-procedure neurological function: normal Pre-procedure range of motion: reduced Immobilization: brace Splint type: ankle stirrup Supplies used: Prefabricated Velcro splint. Post-procedure neurovascular assessment: post-procedure neurovascularly intact Patient tolerance: patient tolerated the procedure well with no immediate complications     ____________________________________________   INITIAL IMPRESSION / ASSESSMENT AND PLAN / ED COURSE  Justin Andrews is a 85 y.o. who presents to the emergency department for concern as listed in HPI. He has a legal guardian that cannot be reached. At this time I have no permission to treat. Registration is attempting to get in touch with the guardian.  Guardian reached and gave permission to treat per RN who spoke with her.   Image of the left ankle shows no bony abnormality.  Ankle stirrup splint was applied.  Bridgette Habermann who is listed as the current guardian was notified of plan to apply splint and discharge him home.  Follow-up instructions were discussed as well.   Medications  acetaminophen (TYLENOL) tablet 650 mg (has no administration in time range)    Pertinent labs & imaging  results that were available during my care of the patient were reviewed by me and considered in my medical decision making (see chart for details).  _________________________________________   FINAL CLINICAL IMPRESSION(S) / ED DIAGNOSES  Final diagnoses:  Sprain of left ankle, unspecified ligament, initial encounter    ED Discharge Orders    None       If controlled substance prescribed during this visit, 12 month history viewed on the [place] prior to issuing an initial prescription for Schedule II or III opiod.   Victorino Dike, FNP 08/17/18 1846    Nance Pear, MD 08/17/18 [phone]

## 2018-08-17 NOTE — Discharge Instructions (Addendum)
Please follow up with primary care or orthopedics if not improving over the next week.  Return to the ER for symptoms that change or worsen if unable to schedule an appointment.

## 2018-08-17 NOTE — Telephone Encounter (Signed)
Message from Jodie Echevaria sent at 08/17/2018 3:13 PM EDT  Patient called to say that his feet are swollen and red making it difficult to put his shoes on states that its been a few days since this have been going on. Patient is asking for a call back please to advise him on what to do. Ph# [phone]    Returned call to patient regarding swollen feet and ankles . Also pt reported that he was having shortness of breath, even without exertion.  The swelling has been going on for a while and he had this problem before but it went away. Not going away this time. He is on a diuretic also. Per protocol, he should needs to go to ED.  Pt voiced understanding. Will notify [facility] at [facility] for review. Routing to the practice.    Reason for Disposition . Difficulty breathing  Answer Assessment - Initial Assessment Questions 1. LOCATION: "Which joint is swollen?"     Above the ankle 2. ONSET: "When did the swelling start?"     both 3. SIZE: "How large is the swelling?"     Covered the ankle joint 4. PAIN: "Is there any pain?" If so, ask: "How bad is it?" (Scale 1-10; or mild, moderate, severe)     soreness 5. CAUSE: "What do you think caused the swollen joint?"     no 6. OTHER SYMPTOMS: "Do you have any other symptoms?" (e.g., fever, chest pain, difficulty breathing, calf pain)     Shortness of breath 7. PREGNANCY: "Is there any chance you are pregnant?" "When was your last menstrual period?"     n/a  Protocols used: ANKLE SWELLING-A-AH

## 2018-08-17 NOTE — ED Triage Notes (Signed)
Presents vis EMS from [place] he fell last Thursday  Swelling and bruising noted to left ankle/foot

## 2018-08-19 DIAGNOSIS — R410 Disorientation, unspecified: Secondary | ICD-10-CM | POA: Diagnosis not present

## 2018-08-21 ENCOUNTER — Telehealth: Payer: Self-pay | Admitting: Family Medicine

## 2018-08-21 NOTE — Telephone Encounter (Signed)
Dr Charlyne Quale need to speak to Dr Caryl Bis concerning pt's skin cancer. Please call

## 2018-08-23 ENCOUNTER — Emergency Department: Payer: Medicare HMO

## 2018-08-23 ENCOUNTER — Inpatient Hospital Stay: Payer: Medicare HMO

## 2018-08-23 ENCOUNTER — Other Ambulatory Visit: Payer: Self-pay

## 2018-08-23 ENCOUNTER — Inpatient Hospital Stay
Admission: EM | Admit: 2018-08-23 | Discharge: 2018-09-04 | DRG: 291 | Disposition: A | Discharge status: Home / Self Care | Payer: Medicare HMO | Attending: Internal Medicine | Admitting: Internal Medicine

## 2018-08-23 DIAGNOSIS — I340 Nonrheumatic mitral (valve) insufficiency: Secondary | ICD-10-CM | POA: Diagnosis not present

## 2018-08-23 DIAGNOSIS — D509 Iron deficiency anemia, unspecified: Secondary | ICD-10-CM | POA: Diagnosis present

## 2018-08-23 DIAGNOSIS — R000 Tachycardia, unspecified: Secondary | ICD-10-CM | POA: Diagnosis not present

## 2018-08-23 DIAGNOSIS — J440 Chronic obstructive pulmonary disease with acute lower respiratory infection: Secondary | ICD-10-CM | POA: Diagnosis present

## 2018-08-23 DIAGNOSIS — R0602 Shortness of breath: Secondary | ICD-10-CM

## 2018-08-23 DIAGNOSIS — I472 Ventricular tachycardia: Secondary | ICD-10-CM | POA: Diagnosis not present

## 2018-08-23 DIAGNOSIS — D631 Anemia in chronic kidney disease: Secondary | ICD-10-CM | POA: Diagnosis not present

## 2018-08-23 DIAGNOSIS — J810 Acute pulmonary edema: Secondary | ICD-10-CM | POA: Diagnosis not present

## 2018-08-23 DIAGNOSIS — K5900 Constipation, unspecified: Secondary | ICD-10-CM | POA: Diagnosis present

## 2018-08-23 DIAGNOSIS — J189 Pneumonia, unspecified organism: Secondary | ICD-10-CM

## 2018-08-23 DIAGNOSIS — I4891 Unspecified atrial fibrillation: Secondary | ICD-10-CM | POA: Diagnosis not present

## 2018-08-23 DIAGNOSIS — R531 Weakness: Secondary | ICD-10-CM | POA: Diagnosis not present

## 2018-08-23 DIAGNOSIS — F329 Major depressive disorder, single episode, unspecified: Secondary | ICD-10-CM | POA: Diagnosis present

## 2018-08-23 DIAGNOSIS — J9601 Acute respiratory failure with hypoxia: Secondary | ICD-10-CM | POA: Diagnosis present

## 2018-08-23 DIAGNOSIS — N184 Chronic kidney disease, stage 4 (severe): Secondary | ICD-10-CM | POA: Diagnosis present

## 2018-08-23 DIAGNOSIS — I130 Hypertensive heart and chronic kidney disease with heart failure and stage 1 through stage 4 chronic kidney disease, or unspecified chronic kidney disease: Secondary | ICD-10-CM | POA: Diagnosis not present

## 2018-08-23 DIAGNOSIS — J9811 Atelectasis: Secondary | ICD-10-CM | POA: Diagnosis not present

## 2018-08-23 DIAGNOSIS — J9690 Respiratory failure, unspecified, unspecified whether with hypoxia or hypercapnia: Secondary | ICD-10-CM

## 2018-08-23 DIAGNOSIS — Z9119 Patient's noncompliance with other medical treatment and regimen: Secondary | ICD-10-CM

## 2018-08-23 DIAGNOSIS — J441 Chronic obstructive pulmonary disease with (acute) exacerbation: Secondary | ICD-10-CM | POA: Diagnosis present

## 2018-08-23 DIAGNOSIS — T502X5A Adverse effect of carbonic-anhydrase inhibitors, benzothiadiazides and other diuretics, initial encounter: Secondary | ICD-10-CM | POA: Diagnosis present

## 2018-08-23 DIAGNOSIS — F17210 Nicotine dependence, cigarettes, uncomplicated: Secondary | ICD-10-CM | POA: Diagnosis present

## 2018-08-23 DIAGNOSIS — J90 Pleural effusion, not elsewhere classified: Secondary | ICD-10-CM | POA: Diagnosis not present

## 2018-08-23 DIAGNOSIS — I420 Dilated cardiomyopathy: Secondary | ICD-10-CM | POA: Diagnosis present

## 2018-08-23 DIAGNOSIS — I10 Essential (primary) hypertension: Secondary | ICD-10-CM | POA: Diagnosis not present

## 2018-08-23 DIAGNOSIS — Z515 Encounter for palliative care: Secondary | ICD-10-CM | POA: Diagnosis not present

## 2018-08-23 DIAGNOSIS — R109 Unspecified abdominal pain: Secondary | ICD-10-CM | POA: Diagnosis not present

## 2018-08-23 DIAGNOSIS — N179 Acute kidney failure, unspecified: Secondary | ICD-10-CM | POA: Diagnosis present

## 2018-08-23 DIAGNOSIS — R0689 Other abnormalities of breathing: Secondary | ICD-10-CM | POA: Diagnosis not present

## 2018-08-23 DIAGNOSIS — I5023 Acute on chronic systolic (congestive) heart failure: Secondary | ICD-10-CM | POA: Diagnosis present

## 2018-08-23 DIAGNOSIS — J181 Lobar pneumonia, unspecified organism: Secondary | ICD-10-CM | POA: Diagnosis not present

## 2018-08-23 DIAGNOSIS — G4700 Insomnia, unspecified: Secondary | ICD-10-CM | POA: Diagnosis present

## 2018-08-23 DIAGNOSIS — I361 Nonrheumatic tricuspid (valve) insufficiency: Secondary | ICD-10-CM | POA: Diagnosis not present

## 2018-08-23 DIAGNOSIS — Z85820 Personal history of malignant melanoma of skin: Secondary | ICD-10-CM | POA: Diagnosis not present

## 2018-08-23 DIAGNOSIS — Z20828 Contact with and (suspected) exposure to other viral communicable diseases: Secondary | ICD-10-CM | POA: Diagnosis present

## 2018-08-23 DIAGNOSIS — K219 Gastro-esophageal reflux disease without esophagitis: Secondary | ICD-10-CM | POA: Diagnosis present

## 2018-08-23 DIAGNOSIS — N183 Chronic kidney disease, stage 3 (moderate): Secondary | ICD-10-CM | POA: Diagnosis not present

## 2018-08-23 DIAGNOSIS — I5021 Acute systolic (congestive) heart failure: Secondary | ICD-10-CM | POA: Diagnosis not present

## 2018-08-23 DIAGNOSIS — I5033 Acute on chronic diastolic (congestive) heart failure: Secondary | ICD-10-CM | POA: Diagnosis not present

## 2018-08-23 DIAGNOSIS — Z79899 Other long term (current) drug therapy: Secondary | ICD-10-CM | POA: Diagnosis not present

## 2018-08-23 DIAGNOSIS — J9602 Acute respiratory failure with hypercapnia: Secondary | ICD-10-CM | POA: Diagnosis not present

## 2018-08-23 DIAGNOSIS — I471 Supraventricular tachycardia: Secondary | ICD-10-CM | POA: Diagnosis not present

## 2018-08-23 DIAGNOSIS — R69 Illness, unspecified: Secondary | ICD-10-CM | POA: Diagnosis not present

## 2018-08-23 DIAGNOSIS — Z7189 Other specified counseling: Secondary | ICD-10-CM | POA: Diagnosis not present

## 2018-08-23 DIAGNOSIS — D638 Anemia in other chronic diseases classified elsewhere: Secondary | ICD-10-CM | POA: Diagnosis present

## 2018-08-23 DIAGNOSIS — Z720 Tobacco use: Secondary | ICD-10-CM | POA: Diagnosis not present

## 2018-08-23 DIAGNOSIS — I480 Paroxysmal atrial fibrillation: Secondary | ICD-10-CM | POA: Diagnosis not present

## 2018-08-23 DIAGNOSIS — F17200 Nicotine dependence, unspecified, uncomplicated: Secondary | ICD-10-CM | POA: Diagnosis not present

## 2018-08-23 DIAGNOSIS — F1010 Alcohol abuse, uncomplicated: Secondary | ICD-10-CM | POA: Diagnosis present

## 2018-08-23 DIAGNOSIS — J811 Chronic pulmonary edema: Secondary | ICD-10-CM | POA: Diagnosis not present

## 2018-08-23 DIAGNOSIS — R0902 Hypoxemia: Secondary | ICD-10-CM | POA: Diagnosis not present

## 2018-08-23 HISTORY — DX: Gastrointestinal hemorrhage, unspecified: K92.2

## 2018-08-23 HISTORY — DX: Paroxysmal atrial fibrillation: I48.0

## 2018-08-23 HISTORY — DX: Alcohol abuse, uncomplicated: F10.10

## 2018-08-23 HISTORY — DX: Chronic kidney disease, stage 3 unspecified: N18.30

## 2018-08-23 LAB — COMPREHENSIVE METABOLIC PANEL
ALT: 34 U/L (ref 0–44)
AST: 37 U/L (ref 15–41)
Albumin: 3.8 g/dL (ref 3.5–5.0)
Alkaline Phosphatase: 116 U/L (ref 38–126)
Anion gap: 14 (ref 5–15)
BUN: 35 mg/dL — ABNORMAL HIGH (ref 8–23)
CO2: 23 mmol/L (ref 22–32)
Calcium: 9.4 mg/dL (ref 8.9–10.3)
Chloride: 100 mmol/L (ref 98–111)
Creatinine, Ser: 2.68 mg/dL — ABNORMAL HIGH (ref 0.61–1.24)
GFR calc Af Amer: 25 mL/min — ABNORMAL LOW (ref >60)
GFR calc non Af Amer: 21 mL/min — ABNORMAL LOW (ref >60)
Glucose, Bld: 126 mg/dL — ABNORMAL HIGH (ref 70–99)
Potassium: 4.1 mmol/L (ref 3.5–5.1)
Sodium: 137 mmol/L (ref 135–145)
Total Bilirubin: 0.8 mg/dL (ref 0.3–1.2)
Total Protein: 7.2 g/dL (ref 6.5–8.1)

## 2018-08-23 LAB — PROTIME-INR
INR: 1 (ref 0.8–1.2)
Prothrombin Time: 13.4 seconds (ref 11.4–15.2)

## 2018-08-23 LAB — LACTIC ACID, PLASMA: Lactic Acid, Venous: 1.5 mmol/L (ref 0.5–1.9)

## 2018-08-23 LAB — CBC WITH DIFFERENTIAL/PLATELET
Abs Immature Granulocytes: 0.05 10*3/uL (ref 0.00–0.07)
Basophils Absolute: 0 10*3/uL (ref 0.0–0.1)
Basophils Relative: 0 %
Eosinophils Absolute: 0 10*3/uL (ref 0.0–0.5)
Eosinophils Relative: 0 %
HCT: 35.2 % — ABNORMAL LOW (ref 39.0–52.0)
Hemoglobin: 11.3 g/dL — ABNORMAL LOW (ref 13.0–17.0)
Immature Granulocytes: 1 %
Lymphocytes Relative: 13 %
Lymphs Abs: 0.9 10*3/uL (ref 0.7–4.0)
MCH: 30.5 pg (ref 26.0–34.0)
MCHC: 32.1 g/dL (ref 30.0–36.0)
MCV: 95.1 fL (ref 80.0–100.0)
Monocytes Absolute: 0.4 10*3/uL (ref 0.1–1.0)
Monocytes Relative: 5 %
Neutro Abs: 5.6 10*3/uL (ref 1.7–7.7)
Neutrophils Relative %: 81 %
Platelets: 249 10*3/uL (ref 150–400)
RBC: 3.7 MIL/uL — ABNORMAL LOW (ref 4.22–5.81)
RDW: 17.5 % — ABNORMAL HIGH (ref 11.5–15.5)
WBC: 7 10*3/uL (ref 4.0–10.5)
nRBC: 0 % (ref 0.0–0.2)

## 2018-08-23 LAB — MRSA PCR SCREENING: MRSA by PCR: NEGATIVE

## 2018-08-23 LAB — SARS CORONAVIRUS 2 BY RT PCR (HOSPITAL ORDER, PERFORMED IN ~~LOC~~ HOSPITAL LAB): SARS Coronavirus 2: NEGATIVE

## 2018-08-23 LAB — TROPONIN I (HIGH SENSITIVITY)
Troponin I (High Sensitivity): 69 ng/L — ABNORMAL HIGH (ref <18)
Troponin I (High Sensitivity): 59 ng/L — ABNORMAL HIGH (ref <18)

## 2018-08-23 LAB — APTT: aPTT: 29 seconds (ref 24–36)

## 2018-08-23 LAB — PROCALCITONIN: Procalcitonin: <0.1 ng/mL

## 2018-08-23 LAB — BRAIN NATRIURETIC PEPTIDE: B Natriuretic Peptide: 1820 pg/mL — ABNORMAL HIGH (ref 0.0–100.0)

## 2018-08-23 LAB — MAGNESIUM: Magnesium: 2.3 mg/dL (ref 1.7–2.4)

## 2018-08-23 LAB — PHOSPHORUS: Phosphorus: 4.6 mg/dL (ref 2.5–4.6)

## 2018-08-23 MED ORDER — METHYLPREDNISOLONE SODIUM SUCC 125 MG IJ SOLR
125.0000 mg | Freq: Once | INTRAMUSCULAR | Status: AC
  Administered 2018-08-23: 125 mg via INTRAVENOUS
  Filled 2018-08-23: qty 2

## 2018-08-23 MED ORDER — LORAZEPAM 2 MG/ML IJ SOLN
1.0000 mg | Freq: Four times a day (QID) | INTRAMUSCULAR | Status: DC | PRN
  Administered 2018-08-24: 1 mg via INTRAVENOUS
  Filled 2018-08-23: qty 1

## 2018-08-23 MED ORDER — SODIUM CHLORIDE 0.9 % IV SOLN
1.0000 g | Freq: Once | INTRAVENOUS | Status: AC
  Administered 2018-08-23: 18:00:00 1 g via INTRAVENOUS
  Filled 2018-08-23: qty 10

## 2018-08-23 MED ORDER — TRAZODONE HCL 50 MG PO TABS
100.0000 mg | ORAL_TABLET | Freq: Every day | ORAL | Status: DC
  Administered 2018-08-23 – 2018-09-03 (×12): 100 mg via ORAL
  Filled 2018-08-23 (×9): qty 2
  Filled 2018-08-23 (×2): qty 1
  Filled 2018-08-23: qty 2

## 2018-08-23 MED ORDER — CHLORHEXIDINE GLUCONATE 0.12 % MT SOLN
15.0000 mL | Freq: Two times a day (BID) | OROMUCOSAL | Status: DC
  Administered 2018-08-24 – 2018-09-03 (×15): 15 mL via OROMUCOSAL
  Filled 2018-08-23 (×18): qty 15

## 2018-08-23 MED ORDER — ONDANSETRON HCL 4 MG/2ML IJ SOLN: 4.0000 mg | Freq: Four times a day (QID) | INTRAMUSCULAR | Status: DC | PRN

## 2018-08-23 MED ORDER — FERROUS SULFATE 325 (65 FE) MG PO TABS
325.0000 mg | ORAL_TABLET | Freq: Every day | ORAL | Status: DC
  Administered 2018-08-24 – 2018-09-04 (×12): 325 mg via ORAL
  Filled 2018-08-23 (×12): qty 1

## 2018-08-23 MED ORDER — SODIUM CHLORIDE 0.9 % IV SOLN
1.0000 g | INTRAVENOUS | Status: DC
  Filled 2018-08-23: qty 10

## 2018-08-23 MED ORDER — CHLORHEXIDINE GLUCONATE CLOTH 2 % EX PADS
6.0000 | MEDICATED_PAD | Freq: Every day | CUTANEOUS | Status: DC
  Administered 2018-08-23 – 2018-09-03 (×6): 6 via TOPICAL
  Filled 2018-08-23: qty 6

## 2018-08-23 MED ORDER — ACETAMINOPHEN 325 MG PO TABS
650.0000 mg | ORAL_TABLET | Freq: Four times a day (QID) | ORAL | Status: DC | PRN
  Administered 2018-08-25: 08:00:00 650 mg via ORAL
  Filled 2018-08-23: qty 2

## 2018-08-23 MED ORDER — METOPROLOL TARTRATE 5 MG/5ML IV SOLN
2.5000 mg | INTRAVENOUS | Status: DC | PRN
  Administered 2018-08-23: 22:00:00 2.5 mg via INTRAVENOUS

## 2018-08-23 MED ORDER — FOLIC ACID 1 MG PO TABS
1.0000 mg | ORAL_TABLET | Freq: Every day | ORAL | Status: DC
  Administered 2018-08-24 – 2018-08-25 (×2): 1 mg via ORAL
  Filled 2018-08-23 (×2): qty 1

## 2018-08-23 MED ORDER — PANTOPRAZOLE SODIUM 40 MG PO TBEC
40.0000 mg | DELAYED_RELEASE_TABLET | Freq: Every day | ORAL | Status: DC
  Administered 2018-08-24 – 2018-09-04 (×12): 40 mg via ORAL
  Filled 2018-08-23 (×12): qty 1

## 2018-08-23 MED ORDER — THIAMINE HCL 100 MG/ML IJ SOLN
100.0000 mg | Freq: Every day | INTRAMUSCULAR | Status: DC
  Administered 2018-08-23: 23:00:00 100 mg via INTRAVENOUS
  Filled 2018-08-23: qty 2

## 2018-08-23 MED ORDER — APIXABAN 2.5 MG PO TABS
2.5000 mg | ORAL_TABLET | Freq: Two times a day (BID) | ORAL | Status: DC
  Administered 2018-08-23: 23:00:00 2.5 mg via ORAL
  Filled 2018-08-23: qty 1

## 2018-08-23 MED ORDER — VITAMIN B-1 100 MG PO TABS
100.0000 mg | ORAL_TABLET | Freq: Every day | ORAL | Status: DC
  Administered 2018-08-24 – 2018-08-25 (×2): 100 mg via ORAL
  Filled 2018-08-23 (×2): qty 1

## 2018-08-23 MED ORDER — CALCIUM CARBONATE-VITAMIN D 500-200 MG-UNIT PO TABS
1.0000 | ORAL_TABLET | Freq: Two times a day (BID) | ORAL | Status: DC
  Administered 2018-08-24 – 2018-09-04 (×23): 1 via ORAL
  Filled 2018-08-23 (×23): qty 1

## 2018-08-23 MED ORDER — POLYETHYLENE GLYCOL 3350 17 G PO PACK
17.0000 g | PACK | Freq: Every day | ORAL | Status: DC | PRN
  Administered 2018-08-24: 17 g via ORAL
  Filled 2018-08-23: qty 1

## 2018-08-23 MED ORDER — DIFLUPREDNATE 0.05 % OP EMUL: 1.0000 [drp] | Freq: Every day | OPHTHALMIC | Status: DC

## 2018-08-23 MED ORDER — IPRATROPIUM-ALBUTEROL 0.5-2.5 (3) MG/3ML IN SOLN
3.0000 mL | Freq: Once | RESPIRATORY_TRACT | Status: AC
  Administered 2018-08-23: 19:00:00 3 mL via RESPIRATORY_TRACT

## 2018-08-23 MED ORDER — NICOTINE 14 MG/24HR TD PT24: 14.0000 mg | MEDICATED_PATCH | Freq: Every day | TRANSDERMAL | Status: DC | PRN

## 2018-08-23 MED ORDER — TAMSULOSIN HCL 0.4 MG PO CAPS
0.4000 mg | ORAL_CAPSULE | Freq: Every day | ORAL | Status: DC
  Administered 2018-08-23 – 2018-09-03 (×12): 0.4 mg via ORAL
  Filled 2018-08-23 (×12): qty 1

## 2018-08-23 MED ORDER — IPRATROPIUM-ALBUTEROL 0.5-2.5 (3) MG/3ML IN SOLN
3.0000 mL | Freq: Four times a day (QID) | RESPIRATORY_TRACT | Status: DC
  Administered 2018-08-23 – 2018-08-28 (×17): 3 mL via RESPIRATORY_TRACT
  Filled 2018-08-23 (×18): qty 3

## 2018-08-23 MED ORDER — THIAMINE HCL 100 MG PO TABS: 100.0000 mg | ORAL_TABLET | Freq: Every day | ORAL | Status: DC

## 2018-08-23 MED ORDER — SODIUM CHLORIDE 0.9 % IV SOLN
500.0000 mg | Freq: Once | INTRAVENOUS | Status: AC
  Administered 2018-08-23: 19:00:00 500 mg via INTRAVENOUS
  Filled 2018-08-23: qty 500

## 2018-08-23 MED ORDER — FOLIC ACID 1 MG PO TABS: 1.0000 mg | ORAL_TABLET | Freq: Every day | ORAL | Status: DC

## 2018-08-23 MED ORDER — ORAL CARE MOUTH RINSE
15.0000 mL | Freq: Two times a day (BID) | OROMUCOSAL | Status: DC
  Administered 2018-08-25 – 2018-09-02 (×11): 15 mL via OROMUCOSAL

## 2018-08-23 MED ORDER — FUROSEMIDE 10 MG/ML IJ SOLN: 40.0000 mg | Freq: Every day | INTRAMUSCULAR | Status: DC

## 2018-08-23 MED ORDER — FUROSEMIDE 10 MG/ML IJ SOLN
40.0000 mg | Freq: Once | INTRAMUSCULAR | Status: AC
  Administered 2018-08-23: 18:00:00 40 mg via INTRAVENOUS
  Filled 2018-08-23: qty 4

## 2018-08-23 MED ORDER — IPRATROPIUM-ALBUTEROL 0.5-2.5 (3) MG/3ML IN SOLN
RESPIRATORY_TRACT | Status: AC
  Filled 2018-08-23: qty 3

## 2018-08-23 MED ORDER — MIRTAZAPINE 15 MG PO TABS
15.0000 mg | ORAL_TABLET | Freq: Every day | ORAL | Status: DC
  Administered 2018-08-23 – 2018-09-03 (×12): 15 mg via ORAL
  Filled 2018-08-23 (×12): qty 1

## 2018-08-23 MED ORDER — ACETAMINOPHEN 650 MG RE SUPP: 650.0000 mg | Freq: Four times a day (QID) | RECTAL | Status: DC | PRN

## 2018-08-23 MED ORDER — IPRATROPIUM-ALBUTEROL 0.5-2.5 (3) MG/3ML IN SOLN
RESPIRATORY_TRACT | Status: AC
  Administered 2018-08-23: 19:00:00 3 mL via RESPIRATORY_TRACT
  Filled 2018-08-23: qty 3

## 2018-08-23 MED ORDER — ONDANSETRON HCL 4 MG PO TABS: 4.0000 mg | ORAL_TABLET | Freq: Four times a day (QID) | ORAL | Status: DC | PRN

## 2018-08-23 MED ORDER — LORAZEPAM 1 MG PO TABS: 1.0000 mg | ORAL_TABLET | Freq: Four times a day (QID) | ORAL | Status: DC | PRN

## 2018-08-23 MED ORDER — ADULT MULTIVITAMIN W/MINERALS CH
1.0000 | ORAL_TABLET | Freq: Every day | ORAL | Status: DC
  Administered 2018-08-24 – 2018-08-25 (×2): 1 via ORAL
  Filled 2018-08-23 (×2): qty 1

## 2018-08-23 MED ORDER — SODIUM CHLORIDE 0.9 % IV SOLN
500.0000 mg | INTRAVENOUS | Status: DC
  Filled 2018-08-23: qty 500

## 2018-08-23 NOTE — ED Notes (Signed)
Pt's mask changed to smaller size for better bit; pt's dentures removed and placed in pink denture cup labeled with his name; will send to floor with pt

## 2018-08-23 NOTE — H&P (Addendum)
[place] [facility] [place] NAME: Justin Andrews    MR#:  [id number]  DATE OF BIRTH:  [birth year]  DATE OF ADMISSION:  08/23/2018  PRIMARY CARE PHYSICIAN: Leone Haven, MD   REQUESTING/REFERRING PHYSICIAN: Lavonia Drafts, MD  CHIEF COMPLAINT:   Chief Complaint  Patient presents with  . Weakness  . Shortness of Breath    HISTORY OF PRESENT ILLNESS:  Justin Andrews  is a 84 y.o. male with a known history of melanoma, hypertension, depression, tobacco use who presented to the ED with shortness of breath over the last 5 days.  He states that his shortness of breath is getting progressively worse.  He endorses cough productive of yellow sputum.  He also endorses overall weakness.  He denies any chest pain or palpitations.  He does endorse some significant lower extremity edema over the last few weeks.  In the ED, he was tachycardic with heart rate 111 and in new onset A. Fib.  He was also tachypneic. He required 4 L O2 by nasal cannula.  Labs were significant for creatinine 2.68, BNP 1820, troponin 69, hemoglobin 11.3.  Chest x-ray with new diffuse pulmonary edema and patchy airspace disease at the right lung, suspicious for pneumonia.  He was given ceftriaxone and azithromycin.  He was also given a dose of IV Lasix.  Hospitalist were called for admission.  PAST MEDICAL HISTORY:   Past Medical History:  Diagnosis Date  . C. difficile diarrhea 11/2017  . Cancer ([place])    skin  . Depression   . Elevated serum creatinine   . GERD (gastroesophageal reflux disease)   . History of melanoma   . Hypertension   . Osteoarthritis   . Tobacco abuse     PAST SURGICAL HISTORY:   Past Surgical History:  Procedure Laterality Date  . ANKLE FRACTURE SURGERY    . CATARACT EXTRACTION W/PHACO Right 08/13/2018   Procedure: CATARACT EXTRACTION PHACO AND INTRAOCULAR LENS PLACEMENT ([place]) RIGHT [place];  Surgeon: Marchia Meiers, MD;  Location: [facility] ORS;   Service: Ophthalmology;  Laterality: Right;  [place]  01:16 CDE 13.15 Fluid pack lot # [id number]  . CENTRAL VENOUS CATHETER INSERTION  05/08/2018   Procedure: INSERTION CENTRAL LINE ADULT;  Surgeon: Olean Ree, MD;  Location: [facility];  Service: General;;  . ESOPHAGOGASTRODUODENOSCOPY  05/08/2018   Procedure: ESOPHAGOGASTRODUODENOSCOPY (EGD);  Surgeon: Olean Ree, MD;  Location: [facility];  Service: General;;  . ESOPHAGOGASTRODUODENOSCOPY (EGD) WITH PROPOFOL N/A 05/03/2018   Procedure: ESOPHAGOGASTRODUODENOSCOPY (EGD) WITH PROPOFOL;  Surgeon: Toledo, Benay Pike, MD;  Location: [facility];  Service: Gastroenterology;  Laterality: N/A;  . EXTERNAL EAR SURGERY    . INTRAMEDULLARY (IM) NAIL INTERTROCHANTERIC Left 12/01/2017   Procedure: INTRAMEDULLARY (IM) NAIL INTERTROCHANTRIC;  Surgeon: Thornton Park, MD;  Location: [facility];  Service: Orthopedics;  Laterality: Left;  . JEJUNOSTOMY  05/08/2018   Procedure: JEJUNOSTOMY;  Surgeon: Olean Ree, MD;  Location: [facility];  Service: General;;  . LAPAROTOMY N/A 05/08/2018   Procedure: EXPLORATORY LAPAROTOMY;  Surgeon: Olean Ree, MD;  Location: [facility];  Service: General;  Laterality: N/A;  . ROTATOR CUFF REPAIR    . TONSILLECTOMY    . TYMPANOPLASTY W/ MASTOIDECTOMY     Patient with reports of mastoid surgery (appears to have had surgery on TM; Left).  . VISCERAL ARTERY INTERVENTION N/A 05/04/2018   Procedure: VISCERAL ARTERY INTERVENTION;  Surgeon: Algernon Huxley, MD;  Location: [place] [facility];  Service: Cardiovascular;  Laterality: N/A;    SOCIAL HISTORY:   Social History   Tobacco Use  . Smoking status: Current Every Day Smoker    Packs/day: 1.00    Years: 70.00    Pack years: 70.00    Types: Cigarettes  . Smokeless tobacco: Never Used  Substance Use Topics  . Alcohol use: No    FAMILY HISTORY:   Family History  Problem Relation Age of Onset  . Prostate cancer Neg Hx   . Bladder Cancer Neg Hx   . Kidney cancer Neg Hx      DRUG ALLERGIES:   Allergies  Allergen Reactions  . Aspirin Other (See Comments)    bleeding    REVIEW OF SYSTEMS:   ROS-unable to obtain complete ROS due to significant shortness of breath.  See HPI.  MEDICATIONS AT HOME:   Prior to Admission medications   Medication Sig Start Date End Date Taking? Authorizing Provider  ALPRAZolam (XANAX) 0.25 MG tablet Take 1 tablet (0.25 mg total) by mouth 2 (two) times daily as needed for anxiety. Patient not taking: Reported on 08/07/2018 07/14/18   Leone Haven, MD  calcium-vitamin D (OSCAL WITH D) 500-200 MG-UNIT tablet Take 1 tablet by mouth 2 (two) times daily. 07/16/18   Leone Haven, MD  doxycycline (VIBRA-TABS) 100 MG tablet Take 1 tablet (100 mg total) by mouth 2 (two) times daily. 08/04/18   Guse, Jacquelynn Cree, FNP  FEROSUL 325 (65 Fe) MG tablet TAKE 1 TABLET BY MOUTH ONCE DAILY FOR SUPPLEMENT Patient taking differently: Take 325 mg by mouth daily.  07/14/18   Leone Haven, MD  folic acid (FOLVITE) 1 MG tablet TAKE 1 TABLET BY MOUTH DAILY Patient taking differently: Take 1 mg by mouth daily.  07/14/18   Leone Haven, MD  mirtazapine (REMERON) 30 MG tablet Take 0.5 tablets (15 mg total) by mouth at bedtime. 06/17/18   Loletha Grayer, MD  Nutritional Supplements (CARNATION BREAKFAST ESSENTIALS) LIQD Take 1 Bottle by mouth 2 (two) times daily between meals. Patient not taking: Reported on 08/07/2018 07/17/18   Leone Haven, MD  pantoprazole (PROTONIX) 40 MG tablet TAKE 1 TABLET DAILY AT 7:00 AM ON AN EMPTY STOMACH Patient taking differently: Take 40 mg by mouth daily.  07/14/18   Leone Haven, MD  tamsulosin (FLOMAX) 0.4 MG CAPS capsule TAKE 1 CAPSULE BY MOUTH DAILY Patient taking differently: Take 0.4 mg by mouth daily.  07/14/18   Leone Haven, MD  thiamine 100 MG tablet Take 1 tablet (100 mg total) by mouth daily. 07/16/18   Leone Haven, MD  traZODone (DESYREL) 100 MG tablet Take 1 tablet (100 mg total) by  mouth at bedtime. Patient not taking: Reported on 08/07/2018 07/27/18   Leone Haven, MD      VITAL SIGNS:  Blood pressure (!) 129/91, pulse 93, temperature 97.7 F (36.5 C), temperature source Oral, resp. rate (!) 30, height 5\' 7"  (1.702 m), weight 54.4 kg, SpO2 95 %.  PHYSICAL EXAMINATION:  Physical Exam  GENERAL:  84 y.o.-year-old patient lying in the bed with no acute distress. + Appears to be in moderate respiratory distress EYES: Pupils equal, round, reactive to light and accommodation. No scleral icterus. Extraocular muscles intact.  HEENT: Head atraumatic, normocephalic. Oropharynx and nasopharynx clear.  NECK:  Supple, no jugular venous distention. No thyroid enlargement, no tenderness.  LUNGS: + Diminished breath sounds throughout all lung fields.  Increased respiratory rate. + Moderate to significant work of breathing CARDIOVASCULAR:  Irregularly irregular rhythm, tachycardic, S1, S2 normal. No murmurs, rubs, or gallops.  ABDOMEN: Soft, nontender, nondistended. Bowel sounds present. No organomegaly or mass.  EXTREMITIES: No cyanosis, or clubbing. 2+ pitting edema bilaterally. NEUROLOGIC: Cranial nerves II through XII are intact. + Global weakness.  Sensation intact. Gait not checked.  PSYCHIATRIC: The patient is alert and oriented x 3.  SKIN: No obvious rash, lesion, or ulcer.   LABORATORY PANEL:   CBC Recent Labs  Lab 08/23/18 1703  WBC 7.0  HGB 11.3*  HCT 35.2*  PLT 249   ------------------------------------------------------------------------------------------------------------------  Chemistries  Recent Labs  Lab 08/23/18 1703  NA 137  K 4.1  CL 100  CO2 23  GLUCOSE 126*  BUN 35*  CREATININE 2.68*  CALCIUM 9.4  AST 37  ALT 34  ALKPHOS 116  BILITOT 0.8   ------------------------------------------------------------------------------------------------------------------  Cardiac Enzymes No results for input(s): TROPONINI in the last 168 hours.  ------------------------------------------------------------------------------------------------------------------  RADIOLOGY:  Dg Chest Portable 1 View  Result Date: 08/23/2018 CLINICAL DATA:  Shortness of breath. EXAM: PORTABLE CHEST 1 VIEW COMPARISON:  Multiple prior chest x-rays, most recently dated July 31, 2018. FINDINGS: The heart size and mediastinal contours are within normal limits. Atherosclerotic calcification of the aortic arch. New pulmonary vascular congestion and diffuse interstitial thickening. The lungs remain hyperinflated with emphysematous changes. New patchy opacities at the right lung base. New small right pleural effusion. No pneumothorax. No acute osseous abnormality. Old bilateral rib fractures. IMPRESSION: 1. New diffuse interstitial pulmonary edema and small right pleural effusion. 2. New patchy airspace disease at the right lung base suspicious for pneumonia/aspiration. 3. COPD. Electronically Signed   By: Titus Dubin M.D.   On: 08/23/2018 18:00      IMPRESSION AND PLAN:   Acute hypoxic respiratory failure- secondary to pulmonary edema (no history of CHF) and community-acquired pneumonia. Some concern for possible aspiration, given his history of alcoholism. COPD exacerbation may be contributing. Not meeting sepsis criteria on admission. -Start BiPAP due to increased work of breathing -Continue broad-spectrum antibiotics -Continue IV Lasix -Will give a dose of Solumedrol -Check ECHO -Check procalcitonin -Follow-up blood and sputum cultures -Duonebs scheduled  New onset atrial fibrillation- likely related to his CAP. Patient is currently rate-controlled. -Start eliquis -Will hold off on rate-controlling med for now in the setting of likely new acute heart failure -Cardiology consult -Cardiac monitoring  Elevated troponin- likely due to above -Trend troponins  AKI in CKD III-IV- creatinine is 2.68 (baseline 1.5-2.0) -Avoid nephrotoxic agents  Chronic  iron deficiency anemia-hemoglobin at baseline -Continue home ferrous sulfate  Tobacco use -Nicotine patch as needed  Alcohol abuse -CIWA -Thiamine, folate, MVI  All the records are reviewed and case discussed with ED provider. Management plans discussed with the patient, family and they are in agreement.  CODE STATUS: Full  TOTAL TIME TAKING CARE OF THIS PATIENT: 45 minutes.    Berna Spare Fianna Snowball M.D on 08/23/2018 at 7:11 PM  Between 7am to 6pm - Pager - [phone]  After 6pm go to [email or web address] - [employer] [facility]  Office  [phone]  CC: Primary care physician; Leone Haven, MD   Note: This dictation was prepared with Dragon dictation along with smaller phrase technology. Any transcriptional errors that result from this process are unintentional.

## 2018-08-23 NOTE — ED Triage Notes (Signed)
Pt presents to ED via [facility] with c/o AMS and shortness of breath. Per EMS initially called out for AMS and weakness, upon arrival pt's O2 sats upper 80's. Pt arrives to ED on 4L via [place], in visible respiratory distress at this time, with subcostal retractions and tachypnea.

## 2018-08-23 NOTE — Telephone Encounter (Signed)
Entered in delay.  I spoke with Dr Charlyne Quale on Friday.  He wanted to get a better idea from me whether or not the patient seemed as though he wanted to aggressively treat anything with regards to his melanoma.  He noted that based on his discussion last year with the patient the patient was not eager to aggressively treat the melanoma though currently the patient's social worker with DSS is stating that the patient wanted to treat it aggressively.  The dermatologist noted that the patient has a small area of his melanoma within a larger area of superficial melanoma on his cheek that is starting to invade and notes that any surgical intervention would be a fairly significant surgery to remove this melanoma.  I discussed that the patient has not verbalized any strong desires to aggressively treat anything.  Recently he has noted that he would be willing to see the specialists that he would not follow-up with previously.  He has not consistently followed up on his medical issues over the last year or so despite the seriousness of his medical issues though has been willing to see them more recently.  Dr Charlyne Quale also noted that the patient seemed to be cognitively intact when he has spoken with him recently and previously.  I advised that I felt that he was competent currently based on my interactions with him though that he had been deemed incompetent at 1 point by psychiatry when he was hospitalized. Dr Charlyne Quale noted that he would discuss with the patient and write a letter to DSS outlining his thoughts in a detailed manner.

## 2018-08-23 NOTE — Progress Notes (Signed)
eLink Physician-Brief Progress Note Patient Name: SRIMAN TALLY DOB: 17-Feb-1934 MRN: [id number]   Date of Service  08/23/2018  HPI/Events of Note  84 year old man with alcohol use, CKD, admitted with palpitations and shortness of breath. Noted to have A fib with RVR and needing BIPAP. Plan is for rate control, anticoagulation, BIPAP use, diuresis, empiric CAP coverage and monitor. Would trend troponin and cardiology has been consulted. ON BIPAP at 30% fio2, he is comfortable. Discussed with bedside team.   eICU Interventions  Seen on camera Discussed with bedside RN No acute needs from E link at this time  Please call if needed     Intervention Category Major Interventions: Respiratory failure - evaluation and management;Arrhythmia - evaluation and management Evaluation Type: New Patient Evaluation  Margaretmary Lombard 08/23/2018, 10:15 PM

## 2018-08-23 NOTE — ED Provider Notes (Addendum)
[facility] Emergency Department Provider Note   ____________________________________________    I have reviewed the triage vital signs and the nursing notes.   HISTORY  Chief Complaint Weakness and Shortness of Breath     HPI Justin Andrews is a 84 y.o. male who presents with complaints of weakness and shortness of breath.  Patient with extensive past medical history as detailed below.  He notes worsening shortness of breath over the last several days although he states that he has been feeling weak for nearly a month.  Denies chest pain.  Does report productive cough.  No fevers or chills.  Does report lower extremity swelling.  Does not wear oxygen at home.  Has not take anything for this.  Past Medical History:  Diagnosis Date  . C. difficile diarrhea 11/2017  . Cancer ([place])    skin  . Depression   . Elevated serum creatinine   . GERD (gastroesophageal reflux disease)   . History of melanoma   . Hypertension   . Osteoarthritis   . Tobacco abuse     Patient Active Problem List   Diagnosis Date Noted  . Encounter for competency evaluation 07/28/2018  . History of Clostridioides difficile infection 07/17/2018  . Insufficient social support 06/27/2018  . Abdominal pain 05/28/2018  . GI bleed 05/02/2018  . Hemorrhagic shock ([place]) 05/02/2018  . Altered mental status 05/02/2018  . Anemia due to blood loss, acute 05/02/2018  . RLS (restless legs syndrome) 04/22/2018  . Wrist pain 04/22/2018  . Chronic hip pain 04/08/2018  . Suicidal ideation 03/19/2018  . Protein-calorie malnutrition, severe 12/03/2017  . Closed left hip fracture ([place]) 11/30/2017  . Diarrhea 11/15/2017  . Duodenum disorder 11/15/2017  . Frequency of urination 10/21/2017  . Cigarette smoker 10/06/2017  . Cough 09/25/2017  . Changes in vision 09/25/2017  . Proteinuria 09/25/2017  . Basal cell carcinoma 02/28/2017  . 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 DOE  (dyspnea on exertion) 01/17/2017  . Chronic right upper quadrant pain 11/15/2016  . Balance problem 11/15/2016  . Simple renal cyst 10/21/2016  . Prostate nodule 10/16/2016  . Dysuria 10/16/2016  . CKD (chronic kidney disease) stage 3, GFR 30-59 ml/min (HCC) 08/05/2016  . Anemia 08/05/2016  . Anxiety and depression 08/05/2016  . Weight loss 08/05/2016  . Sleep disturbance 07/23/2016  . Hypertension   . Osteoarthritis   . Tobacco abuse   . 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[place]) 03/06/2016    Past Surgical History:  Procedure Laterality Date  . ANKLE FRACTURE SURGERY    . CATARACT EXTRACTION W/PHACO Right 08/13/2018   Procedure: CATARACT EXTRACTION PHACO AND INTRAOCULAR LENS PLACEMENT ([place]) RIGHT [place];  Surgeon: Marchia Meiers, MD;  Location: [facility] ORS;  Service: Ophthalmology;  Laterality: Right;  [place]  01:16 CDE 13.15 Fluid pack lot # [id number]  . CENTRAL VENOUS CATHETER INSERTION  05/08/2018   Procedure: INSERTION CENTRAL LINE ADULT;  Surgeon: Olean Ree, MD;  Location: [facility] ORS;  Service: General;;  . ESOPHAGOGASTRODUODENOSCOPY  05/08/2018   Procedure: ESOPHAGOGASTRODUODENOSCOPY (EGD);  Surgeon: Olean Ree, MD;  Location: [facility] ORS;  Service: General;;  . ESOPHAGOGASTRODUODENOSCOPY (EGD) WITH PROPOFOL N/A 05/03/2018   Procedure: ESOPHAGOGASTRODUODENOSCOPY (EGD) WITH PROPOFOL;  Surgeon: Toledo, Benay Pike, MD;  Location: [facility] ENDOSCOPY;  Service: Gastroenterology;  Laterality: N/A;  . EXTERNAL EAR SURGERY    . INTRAMEDULLARY (IM) NAIL INTERTROCHANTERIC Left 12/01/2017   Procedure: INTRAMEDULLARY (IM) NAIL INTERTROCHANTRIC;  Surgeon: Thornton Park, MD;  Location: [facility] ORS;  Service: Orthopedics;  Laterality: Left;  . JEJUNOSTOMY  05/08/2018   Procedure: JEJUNOSTOMY;  Surgeon: Olean Ree, MD;  Location: [facility] ORS;  Service: General;;  . LAPAROTOMY N/A 05/08/2018   Procedure: EXPLORATORY LAPAROTOMY;  Surgeon: Olean Ree, MD;  Location: [facility] ORS;  Service: General;   Laterality: N/A;  . ROTATOR CUFF REPAIR    . TONSILLECTOMY    . TYMPANOPLASTY W/ MASTOIDECTOMY     Patient with reports of mastoid surgery (appears to have had surgery on TM; Left).  . VISCERAL ARTERY INTERVENTION N/A 05/04/2018   Procedure: VISCERAL ARTERY INTERVENTION;  Surgeon: Algernon Huxley, MD;  Location: [facility];  Service: Cardiovascular;  Laterality: N/A;    Prior to Admission medications   Medication Sig Start Date End Date Taking? Authorizing Provider  ALPRAZolam (XANAX) 0.25 MG tablet Take 1 tablet (0.25 mg total) by mouth 2 (two) times daily as needed for anxiety. Patient not taking: Reported on 08/07/2018 07/14/18   Leone Haven, MD  calcium-vitamin D (OSCAL WITH D) 500-200 MG-UNIT tablet Take 1 tablet by mouth 2 (two) times daily. 07/16/18   Leone Haven, MD  doxycycline (VIBRA-TABS) 100 MG tablet Take 1 tablet (100 mg total) by mouth 2 (two) times daily. 08/04/18   Guse, Jacquelynn Cree, FNP  FEROSUL 325 (65 Fe) MG tablet TAKE 1 TABLET BY MOUTH ONCE DAILY FOR SUPPLEMENT Patient taking differently: Take 325 mg by mouth daily.  07/14/18   Leone Haven, MD  folic acid (FOLVITE) 1 MG tablet TAKE 1 TABLET BY MOUTH DAILY Patient taking differently: Take 1 mg by mouth daily.  07/14/18   Leone Haven, MD  mirtazapine (REMERON) 30 MG tablet Take 0.5 tablets (15 mg total) by mouth at bedtime. 06/17/18   Loletha Grayer, MD  Nutritional Supplements (CARNATION BREAKFAST ESSENTIALS) LIQD Take 1 Bottle by mouth 2 (two) times daily between meals. Patient not taking: Reported on 08/07/2018 07/17/18   Leone Haven, MD  pantoprazole (PROTONIX) 40 MG tablet TAKE 1 TABLET DAILY AT 7:00 AM ON AN EMPTY STOMACH Patient taking differently: Take 40 mg by mouth daily.  07/14/18   Leone Haven, MD  tamsulosin (FLOMAX) 0.4 MG CAPS capsule TAKE 1 CAPSULE BY MOUTH DAILY Patient taking differently: Take 0.4 mg by mouth daily.  07/14/18   Leone Haven, MD  thiamine 100 MG tablet  Take 1 tablet (100 mg total) by mouth daily. 07/16/18   Leone Haven, MD  traZODone (DESYREL) 100 MG tablet Take 1 tablet (100 mg total) by mouth at bedtime. Patient not taking: Reported on 08/07/2018 07/27/18   Leone Haven, MD     Allergies Aspirin  Family History  Problem Relation Age of Onset  . Prostate cancer Neg Hx   . Bladder Cancer Neg Hx   . Kidney cancer Neg Hx     Social History Social History   Tobacco Use  . Smoking status: Current Every Day Smoker    Packs/day: 1.00    Years: 70.00    Pack years: 70.00    Types: Cigarettes  . Smokeless tobacco: Never Used  Substance Use Topics  . Alcohol use: No  . Drug use: No    Review of Systems  Constitutional: No fever/chills Eyes: No visual changes.  ENT: No sore throat. Cardiovascular: Denies chest pain. Respiratory: As above Gastrointestinal: No abdominal pain.  No nausea, no vomiting.   Genitourinary: Negative for dysuria. Musculoskeletal: Lower extremity swelling Skin: Negative for  rash. Neurological: Negative for headaches or weakness   ____________________________________________   PHYSICAL EXAM:  VITAL SIGNS: ED Triage Vitals  Enc Vitals Group     BP 08/23/18 1649 (!) 175/87     Pulse Rate 08/23/18 1649 (!) 111     Resp 08/23/18 1649 (!) 25     Temp 08/23/18 1649 97.7 F (36.5 C)     Temp Source 08/23/18 1649 Oral     SpO2 08/23/18 1648 95 %     Weight 08/23/18 1651 54.4 kg (120 lb)     Height 08/23/18 1651 1.702 m (5\' 7" )     Head Circumference --      Peak Flow --      Pain Score --      Pain Loc --      Pain Edu? --      Excl. in Start? --     Constitutional: Alert and oriented.  Ill-appearing Eyes: Conjunctivae are normal.   Nose: No congestion/rhinnorhea. Mouth/Throat: Mucous membranes are dry  Cardiovascular: Tachycardia, irregular rhythm. Grossly normal heart sounds.  Good peripheral circulation. Respiratory: Increased respiratory effort with tachypnea, no retractions,  bibasilar Rales Gastrointestinal: Soft and nontender. No distention.  No CVA tenderness.  Musculoskeletal: 2+ bilateral lower extremity edema Neurologic:  Normal speech and language. No gross focal neurologic deficits are appreciated.  Skin:  Skin is warm, dry and intact. No rash noted. Psychiatric: Mood and affect are normal. Speech and behavior are normal.  ____________________________________________   LABS (all labs ordered are listed, but only abnormal results are displayed)  Labs Reviewed  CBC WITH DIFFERENTIAL/PLATELET - Abnormal; Notable for the following components:      Result Value   RBC 3.70 (*)    Hemoglobin 11.3 (*)    HCT 35.2 (*)    RDW 17.5 (*)    All other components within normal limits  COMPREHENSIVE METABOLIC PANEL - Abnormal; Notable for the following components:   Glucose, Bld 126 (*)    BUN 35 (*)    Creatinine, Ser 2.68 (*)    GFR calc non Af Amer 21 (*)    GFR calc Af Amer 25 (*)    All other components within normal limits  BRAIN NATRIURETIC PEPTIDE - Abnormal; Notable for the following components:   B Natriuretic Peptide 1,820.0 (*)    All other components within normal limits  TROPONIN I (HIGH SENSITIVITY) - Abnormal; Notable for the following components:   Troponin I (High Sensitivity) 69 (*)    All other components within normal limits  SARS CORONAVIRUS 2 (HOSPITAL ORDER, [place] [facility])  CULTURE, BLOOD (ROUTINE X 2)  CULTURE, BLOOD (ROUTINE X 2)  LACTIC ACID, PLASMA  APTT  PROTIME-INR  LACTIC ACID, PLASMA   ____________________________________________  EKG  ED ECG REPORT I, Lavonia Drafts, the attending physician, personally viewed and interpreted this ECG.  Date: 08/23/2018  Rhythm: Atrial fibrillation QRS Axis: normal Intervals: normal ST/T Wave abnormalities: normal Narrative Interpretation: no evidence of acute ischemia  ____________________________________________  RADIOLOGY  Chest x-ray consistent  with pulmonary edema and also possible right lower lobe pneumonia ____________________________________________   PROCEDURES  Procedure(s) performed: No  Procedures   Critical Care performed: yes  CRITICAL CARE Performed by: Lavonia Drafts   Total critical care time: 30 minutes  Critical care time was exclusive of separately billable procedures and treating other patients.  Critical care was necessary to treat or prevent imminent or life-threatening deterioration.  Critical care was time spent personally by  me on the following activities: development of treatment plan with patient and/or surrogate as well as nursing, discussions with consultants, evaluation of patient's response to treatment, examination of patient, obtaining history from patient or surrogate, ordering and performing treatments and interventions, ordering and review of laboratory studies, ordering and review of radiographic studies, pulse oximetry and re-evaluation of patient's condition.  ____________________________________________   INITIAL IMPRESSION / ASSESSMENT AND PLAN / ED COURSE  Pertinent labs & imaging results that were available during my care of the patient were reviewed by me and considered in my medical decision making (see chart for details).  Patient presents with shortness of breath, requiring oxygen to keep his saturations above 90%.  Bilateral lower extremity edema with bibasilar Rales suspicious for CHF with pulmonary edema, also with apparent atrial fibrillation which he does not believe that he has had before.  Pending x-ray   X-ray consistent with pulmonary edema as well as likely right lower lobe pneumonia, will cover with antibiotics, also given Lasix have discussed with the hospitalist for admission.   ----------------------------------------- 7:19 PM on 08/23/2018 -----------------------------------------  Worsening respiratory status, I have ordered BiPAP, notified hospitalist    ____________________________________________   FINAL CLINICAL IMPRESSION(S) / ED DIAGNOSES  Final diagnoses:  Acute pulmonary edema ([place])  Community acquired pneumonia of right lower lobe of lung ([place])  Shortness of breath        Note:  This document was prepared using Dragon voice recognition software and may include unintentional dictation errors.   Lavonia Drafts, MD 08/23/18 Hazle Nordmann    Lavonia Drafts, MD 08/23/18 1919

## 2018-08-23 NOTE — ED Notes (Signed)
In to answer pt's call bell; admitting MD also coming into room at same time; pt with labored breathing; resp 35; pt leaning over to right side; pt finally has some urine output; verbal order for duoneb; pt to be placed on BiPap

## 2018-08-23 NOTE — Progress Notes (Signed)
Family Meeting Note  Advance Directive:no  Today a meeting took place with the Patient.  Patient is able to participate.  The following clinical team members were present during this meeting:MD  The following were discussed:Patient's diagnosis: acute hypoxic respiratory failure, Patient's progosis: Unable to determine and Goals for treatment: Full Code  Additional follow-up to be provided: prn  Time spent during discussion:20 minutes  Justin Andrews D Batul Diego, MD  

## 2018-08-23 NOTE — ED Notes (Signed)
Pt placed on BiPap by RT Mikki Santee; pt positioned for comfort; sats up to 100%; pt had said he was getting tired before mask put on;

## 2018-08-23 NOTE — ED Notes (Signed)
Awaiting RT for transport to floor

## 2018-08-23 NOTE — ED Notes (Signed)
Answered pt's call bell; pt asking for something to drink and eat; understands at this time, due to his respiratory status, that would not be safe; pt given more warm blankets for comfort; HR noted to increase to 138, a-fib

## 2018-08-24 ENCOUNTER — Encounter: Payer: Self-pay | Admitting: Physician Assistant

## 2018-08-24 DIAGNOSIS — J810 Acute pulmonary edema: Secondary | ICD-10-CM

## 2018-08-24 DIAGNOSIS — J189 Pneumonia, unspecified organism: Secondary | ICD-10-CM

## 2018-08-24 DIAGNOSIS — F17200 Nicotine dependence, unspecified, uncomplicated: Secondary | ICD-10-CM

## 2018-08-24 LAB — BASIC METABOLIC PANEL
Anion gap: 18 — ABNORMAL HIGH (ref 5–15)
BUN: 41 mg/dL — ABNORMAL HIGH (ref 8–23)
CO2: 24 mmol/L (ref 22–32)
Calcium: 8.7 mg/dL — ABNORMAL LOW (ref 8.9–10.3)
Chloride: 97 mmol/L — ABNORMAL LOW (ref 98–111)
Creatinine, Ser: 2.76 mg/dL — ABNORMAL HIGH (ref 0.61–1.24)
GFR calc Af Amer: 24 mL/min — ABNORMAL LOW (ref >60)
GFR calc non Af Amer: 20 mL/min — ABNORMAL LOW (ref >60)
Glucose, Bld: 134 mg/dL — ABNORMAL HIGH (ref 70–99)
Potassium: 4.1 mmol/L (ref 3.5–5.1)
Sodium: 139 mmol/L (ref 135–145)

## 2018-08-24 LAB — CBC
HCT: 30.8 % — ABNORMAL LOW (ref 39.0–52.0)
Hemoglobin: 9.8 g/dL — ABNORMAL LOW (ref 13.0–17.0)
MCH: 30.2 pg (ref 26.0–34.0)
MCHC: 31.8 g/dL (ref 30.0–36.0)
MCV: 94.8 fL (ref 80.0–100.0)
Platelets: 228 10*3/uL (ref 150–400)
RBC: 3.25 MIL/uL — ABNORMAL LOW (ref 4.22–5.81)
RDW: 17.4 % — ABNORMAL HIGH (ref 11.5–15.5)
WBC: 4 10*3/uL (ref 4.0–10.5)
nRBC: 0 % (ref 0.0–0.2)

## 2018-08-24 LAB — APTT
aPTT: 29 seconds (ref 24–36)
aPTT: 44 seconds — ABNORMAL HIGH (ref 24–36)

## 2018-08-24 LAB — MAGNESIUM: Magnesium: 2.2 mg/dL (ref 1.7–2.4)

## 2018-08-24 LAB — GLUCOSE, CAPILLARY: Glucose-Capillary: 104 mg/dL — ABNORMAL HIGH (ref 70–99)

## 2018-08-24 MED ORDER — LORAZEPAM 0.5 MG PO TABS: 0.5000 mg | ORAL_TABLET | Freq: Four times a day (QID) | ORAL | Status: DC | PRN

## 2018-08-24 MED ORDER — METOPROLOL TARTRATE 5 MG/5ML IV SOLN
2.5000 mg | Freq: Four times a day (QID) | INTRAVENOUS | Status: DC
  Administered 2018-08-24 (×2): 2.5 mg via INTRAVENOUS
  Filled 2018-08-24 (×2): qty 5

## 2018-08-24 MED ORDER — POLYETHYLENE GLYCOL 3350 17 G PO PACK
17.0000 g | PACK | Freq: Every day | ORAL | Status: DC
  Administered 2018-08-24 – 2018-09-03 (×7): 17 g via ORAL
  Filled 2018-08-24 (×9): qty 1

## 2018-08-24 MED ORDER — HEPARIN (PORCINE) 25000 UT/250ML-% IV SOLN
1050.0000 [IU]/h | INTRAVENOUS | Status: DC
  Administered 2018-08-24: 750 [IU]/h via INTRAVENOUS
  Administered 2018-08-25: 950 [IU]/h via INTRAVENOUS
  Filled 2018-08-24 (×2): qty 250

## 2018-08-24 MED ORDER — METOPROLOL TARTRATE 5 MG/5ML IV SOLN: 5.0000 mg | Freq: Once | INTRAVENOUS | Status: DC

## 2018-08-24 MED ORDER — METHYLPREDNISOLONE SODIUM SUCC 40 MG IJ SOLR
40.0000 mg | Freq: Two times a day (BID) | INTRAMUSCULAR | Status: DC
  Administered 2018-08-24: 40 mg via INTRAVENOUS

## 2018-08-24 MED ORDER — METOPROLOL TARTRATE 5 MG/5ML IV SOLN
INTRAVENOUS | Status: AC
  Administered 2018-08-24: 16:00:00 5 mg
  Filled 2018-08-24: qty 5

## 2018-08-24 MED ORDER — HEPARIN SODIUM (PORCINE) 5000 UNIT/ML IJ SOLN
5000.0000 [IU] | Freq: Three times a day (TID) | INTRAMUSCULAR | Status: DC
  Administered 2018-08-24: 07:00:00 5000 [IU] via SUBCUTANEOUS

## 2018-08-24 MED ORDER — FUROSEMIDE 10 MG/ML IJ SOLN
40.0000 mg | Freq: Two times a day (BID) | INTRAMUSCULAR | Status: DC
  Administered 2018-08-24 – 2018-08-25 (×3): 40 mg via INTRAVENOUS
  Filled 2018-08-24 (×3): qty 4

## 2018-08-24 MED ORDER — LORAZEPAM 2 MG/ML IJ SOLN: 0.5000 mg | Freq: Four times a day (QID) | INTRAMUSCULAR | Status: DC | PRN

## 2018-08-24 NOTE — Progress Notes (Signed)
Dr. Patsey Berthold called to unit due to patient going into afib with RVR. Orders received for 5mg  IV lopressor. Dr. Patsey Berthold says she will look into starting pt on something for maintenance for his afib. She is aware that patient is unable to take PO meds at this time due to being drowsy after Ativan given.

## 2018-08-24 NOTE — Consult Note (Signed)
Cardiology Consultation:   Patient ID: Justin Andrews; [id number]; [birth year]   Admit date: 08/23/2018 Date of Consult: 08/24/2018  Primary Care Provider: Leone Haven, MD Primary Cardiologist: new to [facility] - consult by Fletcher Anon   Patient Profile:   Justin Andrews is a 84 y.o. male with a hx of PAF initially noted on EKG on 05/06/2018, recent GI bleed in 04/2018 as detailed below, CKD stage III, anemia of chronic disease complicated by acute blood loss anemia, C diff colitis, HTN, alcohol and tobacco abuse, melanoma, and recent fall x 2 (3 total in the past 12 months) who is being seen today for the evaluation of PAF with RVR at the request of Dr. Brett Albino.  History of Present Illness:   Mr. Kujawa was previously admitted in 04/2018 with acute GI bleed requiring pRBC transfusion x 2, EGD with cauterization followed by exploratory laparotomy with duodenalplasty. During that admission, he was noted to have developed new onset Afib on 12-lead EKG on 05/06/2018. He was not placed on [place] at that time, likely in the setting of recent GI bleed. Following that admission, he was readmitted with diarrhea in 05/2018 with C diff colitis. He was seen in the ED in late 07/2018 with dyspnea and concern for PNA with repeat CXR appearing to show improved atelectasis. He was seen in the ED on 8/10 following a mechanical fall and reports his most recent mechanical fall being on 08/21/2018. He denies hitting his head or suffering LOC with either fall. He states he has fallen 3 times in the past 12 months.    Patient presented to [facility] on 8/16 with a 3-4 week history of increased SOB, cough productive of white sputum, worsening orthopnea, bilateral ankle swelling, and early satiety. No chest pain, dizziness, presyncope, or syncope. He does not weigh himself at home and is uncertain of his weight trend. He has not had a drink of whiskey or vodka since his discharge in 04/2018 as above. He continues to smoke cigarettes at 1/2 pack  daily and has done so since age 84.   Upon the patient's arrival to [facility] they were found to afebrile with stable vitals, weight 54.4 kg, which appears consistent with his recent weights. EKG as below, CXR showed new diffuse interstitial pulmonary edema with small right pleural effusion with new patchy airspace disease consistent with possible PNA, COPD. Abdominal film showed possible multifocal PNA with new right pleural effusion and possibly developing ileus. Labs showed BUN/SCr 35-->41/2.68-->2.76 with a baseline around 1.8-2.1, K+ 4.1, WBC 7.0, HGB 11.3-->9.8, PLT 249, BNP 1820, hs-Tn 69-->59, lactic acid 1.5, blood cultures no growth to date x 2, PCT <0.10, magnesium 2.3. He was given azithromycin and Rocephin in the ED along with IV Lasix 40 mg, IV steroids, nebs, and IV Lopressor 2.5 mg x 1. Upon admission, he was started on Eliquis 2.5 mg bid given Afib with RVR which was stopped upon arriving to the floor given the patient's recent fall. Documented UOP of 1.2 L for the admission to date. Patient spontaneously converted at 21:45 on 08/23/2018. Currently, continues to note SOB.   Past Medical History:  Diagnosis Date  . Alcohol abuse   . C. difficile diarrhea 11/2017  . Chronic kidney disease (CKD), stage III (moderate) (HCC)   . Depression   . GERD (gastroesophageal reflux disease)   . GI bleed    a. bleeding duodenal ulcers in 04/2018 s/p cauterization and duodenalplasty  . History of melanoma   .  Hypertension   . Osteoarthritis   . PAF (paroxysmal atrial fibrillation) ([place])    a. initially noted on 05/06/2018  . Tobacco abuse     Past Surgical History:  Procedure Laterality Date  . ANKLE FRACTURE SURGERY    . CATARACT EXTRACTION W/PHACO Right 08/13/2018   Procedure: CATARACT EXTRACTION PHACO AND INTRAOCULAR LENS PLACEMENT ([place]) RIGHT [place];  Surgeon: Marchia Meiers, MD;  Location: [facility] ORS;  Service: Ophthalmology;  Laterality: Right;  [place]  01:16 CDE 13.15 Fluid pack lot #  [id number]  . CENTRAL VENOUS CATHETER INSERTION  05/08/2018   Procedure: INSERTION CENTRAL LINE ADULT;  Surgeon: Olean Ree, MD;  Location: [facility] ORS;  Service: General;;  . ESOPHAGOGASTRODUODENOSCOPY  05/08/2018   Procedure: ESOPHAGOGASTRODUODENOSCOPY (EGD);  Surgeon: Olean Ree, MD;  Location: [facility] ORS;  Service: General;;  . ESOPHAGOGASTRODUODENOSCOPY (EGD) WITH PROPOFOL N/A 05/03/2018   Procedure: ESOPHAGOGASTRODUODENOSCOPY (EGD) WITH PROPOFOL;  Surgeon: Toledo, Benay Pike, MD;  Location: [facility] ENDOSCOPY;  Service: Gastroenterology;  Laterality: N/A;  . EXTERNAL EAR SURGERY    . INTRAMEDULLARY (IM) NAIL INTERTROCHANTERIC Left 12/01/2017   Procedure: INTRAMEDULLARY (IM) NAIL INTERTROCHANTRIC;  Surgeon: Thornton Park, MD;  Location: [facility] ORS;  Service: Orthopedics;  Laterality: Left;  . JEJUNOSTOMY  05/08/2018   Procedure: JEJUNOSTOMY;  Surgeon: Olean Ree, MD;  Location: [facility] ORS;  Service: General;;  . LAPAROTOMY N/A 05/08/2018   Procedure: EXPLORATORY LAPAROTOMY;  Surgeon: Olean Ree, MD;  Location: [facility] ORS;  Service: General;  Laterality: N/A;  . ROTATOR CUFF REPAIR    . TONSILLECTOMY    . TYMPANOPLASTY W/ MASTOIDECTOMY     Patient with reports of mastoid surgery (appears to have had surgery on TM; Left).  . VISCERAL ARTERY INTERVENTION N/A 05/04/2018   Procedure: VISCERAL ARTERY INTERVENTION;  Surgeon: Algernon Huxley, MD;  Location: [place] [facility];  Service: Cardiovascular;  Laterality: N/A;     Home Meds: Prior to Admission medications   Medication Sig Start Date End Date Taking? Authorizing Provider  calcium-vitamin D (OSCAL WITH D) 500-200 MG-UNIT tablet Take 1 tablet by mouth 2 (two) times daily. 07/16/18  Yes Leone Haven, MD  doxycycline (VIBRA-TABS) 100 MG tablet Take 1 tablet (100 mg total) by mouth 2 (two) times daily. 08/04/18  Yes Guse, Jacquelynn Cree, FNP  DUREZOL 0.05 % EMUL Place 1 drop into the right eye daily. 08/21/18  Yes [provider]  FEROSUL 325 (65  Fe) MG tablet TAKE 1 TABLET BY MOUTH ONCE DAILY FOR SUPPLEMENT Patient taking differently: Take 325 mg by mouth daily.  07/14/18  Yes Leone Haven, MD  folic acid (FOLVITE) 1 MG tablet TAKE 1 TABLET BY MOUTH DAILY Patient taking differently: Take 1 mg by mouth daily.  07/14/18  Yes Leone Haven, MD  mirtazapine (REMERON) 30 MG tablet Take 0.5 tablets (15 mg total) by mouth at bedtime. 06/17/18  Yes Wieting, Richard, MD  pantoprazole (PROTONIX) 40 MG tablet TAKE 1 TABLET DAILY AT 7:00 AM ON AN EMPTY STOMACH Patient taking differently: Take 40 mg by mouth daily.  07/14/18  Yes Leone Haven, MD  sulfamethoxazole-trimethoprim (BACTRIM DS) 800-160 MG tablet Take 1 tablet by mouth 2 (two) times daily. 08/18/18  Yes [provider]  tamsulosin (FLOMAX) 0.4 MG CAPS capsule TAKE 1 CAPSULE BY MOUTH DAILY Patient taking differently: Take 0.4 mg by mouth daily.  07/14/18  Yes Leone Haven, MD  thiamine 100 MG tablet Take 1 tablet (100 mg total) by mouth daily. 07/16/18  Yes  Leone Haven, MD  traZODone (DESYREL) 100 MG tablet Take 1 tablet (100 mg total) by mouth at bedtime. 07/27/18  Yes Leone Haven, MD  ALPRAZolam Duanne Moron) 0.25 MG tablet Take 1 tablet (0.25 mg total) by mouth 2 (two) times daily as needed for anxiety. Patient not taking: Reported on 08/07/2018 07/14/18   Leone Haven, MD    Inpatient Medications: Scheduled Meds: . calcium-vitamin D  1 tablet Oral BID  . chlorhexidine  15 mL Mouth Rinse BID  . Chlorhexidine Gluconate Cloth  6 each Topical Daily  . Difluprednate  1 drop Right Eye Daily  . ferrous sulfate  325 mg Oral Daily  . folic acid  1 mg Oral Daily  . furosemide  40 mg Intravenous Daily  . heparin injection (subcutaneous)  5,000 Units Subcutaneous Q8H  . ipratropium-albuterol  3 mL Nebulization Q6H  . ipratropium-albuterol      . mouth rinse  15 mL Mouth Rinse q12n4p  . methylPREDNISolone (SOLU-MEDROL) injection  40 mg Intravenous Q12H  .  mirtazapine  15 mg Oral QHS  . multivitamin with minerals  1 tablet Oral Daily  . pantoprazole  40 mg Oral Daily  . polyethylene glycol  17 g Oral Daily  . tamsulosin  0.4 mg Oral Daily  . thiamine  100 mg Oral Daily   Or  . thiamine  100 mg Intravenous Daily  . traZODone  100 mg Oral QHS   Continuous Infusions: . azithromycin    . cefTRIAXone (ROCEPHIN)  IV     PRN Meds: acetaminophen **OR** acetaminophen, LORazepam **OR** LORazepam, metoprolol tartrate, nicotine, ondansetron **OR** ondansetron (ZOFRAN) IV  Allergies:   Allergies  Allergen Reactions  . Aspirin Other (See Comments)    bleeding    Social History:   Social History   Socioeconomic History  . Marital status: Divorced    Spouse name: Not on file  . Number of children: Not on file  . Years of education: Not on file  . Highest education level: Not on file  Occupational History  . Not on file  Social Needs  . Financial resource strain: Not hard at all  . Food insecurity    Worry: Never true    Inability: Never true  . Transportation needs    Medical: No    Non-medical: No  Tobacco Use  . Smoking status: Current Every Day Smoker    Packs/day: 1.00    Years: 70.00    Pack years: 70.00    Types: Cigarettes  . Smokeless tobacco: Never Used  Substance and Sexual Activity  . Alcohol use: No  . Drug use: No  . Sexual activity: Never  Lifestyle  . Physical activity    Days per week: 1 day    Minutes per session: 90 min  . Stress: Not at all  Relationships  . Social Herbalist on phone: Not on file    Gets together: Not on file    Attends religious service: Not on file    Active member of club or organization: Not on file    Attends meetings of clubs or organizations: Not on file    Relationship status: Not on file  . Intimate partner violence    Fear of current or ex partner: Not on file    Emotionally abused: Not on file    Physically abused: Not on file    Forced sexual activity: Not  on file  Other Topics Concern  . Not on  file  Social History Narrative  . Not on file     Family History:   Family History  Problem Relation Age of Onset  . Prostate cancer Neg Hx   . Bladder Cancer Neg Hx   . Kidney cancer Neg Hx     ROS:  Review of Systems  Constitutional: Positive for malaise/fatigue. Negative for chills, diaphoresis, fever and weight loss.  HENT: Negative for congestion.   Eyes: Negative for discharge and redness.  Respiratory: Positive for cough, sputum production, shortness of breath and wheezing. Negative for hemoptysis.        White sputum   Cardiovascular: Positive for leg swelling. Negative for chest pain, palpitations, orthopnea, claudication and PND.       Bilateral ankle edema  Gastrointestinal: Positive for constipation. Negative for abdominal pain, blood in stool, diarrhea, heartburn, melena, nausea and vomiting.       Abdominal distension   Genitourinary: Negative for hematuria.  Musculoskeletal: Negative for falls and myalgias.  Skin: Negative for rash.  Neurological: Positive for weakness. Negative for dizziness, tingling, tremors, sensory change, speech change, focal weakness and loss of consciousness.  Endo/Heme/Allergies: Does not bruise/bleed easily.  Psychiatric/Behavioral: Negative for substance abuse. The patient is not nervous/anxious.   All other systems reviewed and are negative.     Physical Exam/Data:   Vitals:   08/24/18 0600 08/24/18 0700 08/24/18 0724 08/24/18 0800  BP: (!) 115/58 (!) 122/52  130/61  Pulse: 89 91 88 95  Resp: 19 16 (!) 21 (!) 22  Temp:    98 F (36.7 C)  TempSrc:    Oral  SpO2: 99% 92% 99% 97%  Weight:      Height:        Intake/Output Summary (Last 24 hours) at 08/24/2018 0828 Last data filed at 08/24/2018 0800 Gross per 24 hour  Intake 250 ml  Output 1450 ml  Net -1200 ml   Filed Weights   08/23/18 1651 08/23/18 2100  Weight: 54.4 kg 54.7 kg   Body mass index is 18.89 kg/m.   Physical  Exam: General: Elderly and frail appearing. Well developed, well nourished, in no acute distress. Head: Normocephalic, atraumatic, sclera non-icteric, no xanthomas, nares without discharge.  Neck: Negative for carotid bruits. JVD not elevated. Lungs: Diminished breath sounds bilaterally with crackles along the bilateral bases. Breathing is unlabored. Heart: RRR with S1 S2. No murmurs, rubs, or gallops appreciated. Abdomen: Soft, non-tender, mildly distended with hypoactive bowel sounds. No hepatomegaly. No rebound/guarding. No obvious abdominal masses. Msk:  Strength and tone appear normal for age. Extremities: No clubbing or cyanosis. Trace bilateral ankle edema. Distal pedal pulses are 2+ and equal bilaterally. Neuro: Alert and oriented X 3. No facial asymmetry. No focal deficit. Moves all extremities spontaneously. Psych:  Responds to questions appropriately with a normal affect.   EKG:  The EKG was personally reviewed and demonstrates: Initially possible MAT, 114 bpm, baseline wandering with artifact, nonspecific st/t changes  Telemetry:  Telemetry was personally reviewed and demonstrates: when patient was initially placed on telemetry he was in Afib with RVR with spontaneous conversion to sinus rhythm at 21:45 on 08/23/2018 with frequent PACs  Weights: Filed Weights   08/23/18 1651 08/23/18 2100  Weight: 54.4 kg 54.7 kg    Relevant CV Studies: 2D Echo pending  Laboratory Data:  Chemistry Recent Labs  Lab 08/23/18 1703 08/24/18 0333  NA 137 139  K 4.1 4.1  CL 100 97*  CO2 23 24  GLUCOSE 126* 134*  BUN  35* 41*  CREATININE 2.68* 2.76*  CALCIUM 9.4 8.7*  GFRNONAA 21* 20*  GFRAA 25* 24*  ANIONGAP 14 18*    Recent Labs  Lab 08/23/18 1703  PROT 7.2  ALBUMIN 3.8  AST 37  ALT 34  ALKPHOS 116  BILITOT 0.8   Hematology Recent Labs  Lab 08/23/18 1703 08/24/18 0333  WBC 7.0 4.0  RBC 3.70* 3.25*  HGB 11.3* 9.8*  HCT 35.2* 30.8*  MCV 95.1 94.8  MCH 30.5 30.2   MCHC 32.1 31.8  RDW 17.5* 17.4*  PLT 249 228   Cardiac EnzymesNo results for input(s): TROPONINI in the last 168 hours. No results for input(s): TROPIPOC in the last 168 hours.  BNP Recent Labs  Lab 08/23/18 1703  BNP 1,820.0*    DDimer No results for input(s): DDIMER in the last 168 hours.  Radiology/Studies:  Dg Abd 1 View  Result Date: 08/23/2018 IMPRESSION: Multifocal patchy airspace opacities throughout the lungs with the new right pleural effusion, concerning for multifocal pneumonia. Cluster of air-filled but nondistended loops of small bowel in the mid abdomen, nonspecific but could reflect developing ileus in the appropriate clinical setting. Electronically Signed   By: Lovena Le M.D.   On: 08/23/2018 21:57   Dg Chest Portable 1 View  Result Date: 08/23/2018 IMPRESSION: 1. New diffuse interstitial pulmonary edema and small right pleural effusion. 2. New patchy airspace disease at the right lung base suspicious for pneumonia/aspiration. 3. COPD. Electronically Signed   By: Titus Dubin M.D.   On: 08/23/2018 18:00    Assessment and Plan:   1. PAF with RVR: -Patient was initially noted to have Afib on 12-lead EKG dated 05/06/2018 during admission for GI bleed requiring duodenalplasty. He was not placed on [place] at that time, likely in the setting of recent GI bleed requiring pRBC transfusion EGD with cauterization as well as exploratory laparotomy with duodenalplasty -He was again noted to go back into Afib with RVR shortly after arrival on 8/16 as documented on telemetry. His initial 12-lead EKG appears to show MAT, cannot exclude possible Afib with RVR near the end of the study -He spontaneously converted to sinus rhythm with PACs at 21:45 on 08/23/2018 -Start heparin gtt for now until echo is back to evaluate for cardiomyopathy -Once it is clear he will not require inpatient invasive evaluation would transition to Eliquis 2.5 mg bid (age and SCr) prior to discharge as long  as his HGB remains stable -CHADS2VASc at least 5 (CHF, HTN, age x 2, vascular disease)  2. Acute respiratory distress with hypoxia: -Likely multifactorial including acute CHF and AECOPD  -Continues to note SOB and is on nasal cannula, though does not appear distressed at this time -Now off BiPAP -Diurese, nebs, steroids as below -Wean oxygen as able  3. Acute CHF: -Type unknown, echo pending -If his echo demonstrates a cardiomyopathy, we will need to discuss potential inpatient ischemic evaluation prior to discharge  -IV Lasix 40 mg bid with KCl repletion as needed -BNP 1820 with CXR and abdominal films showing pulmonary edema and right pleural effusion  -As his breathing improves, look to add evidence-based therapy as vitals and labs allow -Not currently on beta blocker given AECOPD -Not on ACEi/ARB/spironolactone in the setting of CKD  4. Acute on CKD stage III: -Baseline appears to run around 1.8-2.1 -Hopefully, with diuresis, his renal function will improve  5. Acute on chronic anemia of chronic disease with history of GI bleed: -Patient with recent diagnosis of GI bleed  requiring pRBC and surgical intervention as above -HGB low, though stable at this time -Monitor closely   6. Elevated troponin: -High-sensitivity troponin of 69-->59 -Not consistent with ACS -Check echo as above -Will likely need ischemic evaluation moving forward pending echo, though type of evaluation will certainly be influenced by his renal function moving forward   7. Alcohol/tobacco abuse: -Patient reports no alcohol since his admission in 04/2018 -Prior history of 5th of whiskey or vodka for 2 years -CIWA protocol per IM -Cessation of tobacco advised   8. HTN: -BP well controlled at this time  9. Constipation with possible developing ileus: -Possibly playing a role in some of the patient's early satiety  -Per IM  10. Weakness/falls: -3 falls in the past 12 months with 2 of them occurring in  the past month -Recommend PT/OT   For questions or updates, please contact [place] Please consult [email or web address] for contact info under Cardiology/STEMI.   Signed, Christell Faith, PA-C [facility] Pager: [phone] 08/24/2018, 8:28 AM

## 2018-08-24 NOTE — Progress Notes (Signed)
ANTICOAGULATION CONSULT NOTE - Initial Consult  Pharmacy Consult for Heparin  Indication: atrial fibrillation  Allergies  Allergen Reactions  . Aspirin Other (See Comments)    bleeding    Patient Measurements: Height: 5\' 7"  (170.2 cm) Weight: 120 lb 9.5 oz (54.7 kg) IBW/kg (Calculated) : 66.1 Heparin Dosing Weight: 66.1   Vital Signs: Temp: 98.4 F (36.9 C) (08/17 1700) Temp Source: Axillary (08/17 1700) BP: 93/57 (08/17 1900) Pulse Rate: 78 (08/17 1900)  Labs: Recent Labs    08/23/18 1703 08/23/18 2212 08/24/18 0333 08/24/18 1036 08/24/18 1823  HGB 11.3*  --  9.8*  --   --   HCT 35.2*  --  30.8*  --   --   PLT 249  --  228  --   --   APTT 29  --   --  29 44*  LABPROT 13.4  --   --   --   --   INR 1.0  --   --   --   --   CREATININE 2.68*  --  2.76*  --   --   TROPONINIHS 69* 59*  --   --   --     Estimated Creatinine Clearance: 16 mL/min (A) (by C-G formula based on SCr of 2.76 mg/dL (H)).   Medical History: Past Medical History:  Diagnosis Date  . Alcohol abuse   . C. difficile diarrhea 11/2017  . Chronic kidney disease (CKD), stage III (moderate) (HCC)   . Depression   . GERD (gastroesophageal reflux disease)   . GI bleed    a. bleeding duodenal ulcers in 04/2018 s/p cauterization and duodenalplasty  . History of melanoma   . Hypertension   . Osteoarthritis   . PAF (paroxysmal atrial fibrillation) ([place])    a. initially noted on 05/06/2018  . Tobacco abuse     Medications:  Medications Prior to Admission  Medication Sig Dispense Refill Last Dose  . calcium-vitamin D (OSCAL WITH D) 500-200 MG-UNIT tablet Take 1 tablet by mouth 2 (two) times daily. 60 tablet 1 unknown at Unknown time  . doxycycline (VIBRA-TABS) 100 MG tablet Take 1 tablet (100 mg total) by mouth 2 (two) times daily. 20 tablet 0 unknown at unknown  . DUREZOL 0.05 % EMUL Place 1 drop into the right eye daily.   unknkown at unknown  . FEROSUL 325 (65 Fe) MG tablet TAKE 1 TABLET BY MOUTH  ONCE DAILY FOR SUPPLEMENT (Patient taking differently: Take 325 mg by mouth daily. ) 30 tablet 0 unknown at unknown  . folic acid (FOLVITE) 1 MG tablet TAKE 1 TABLET BY MOUTH DAILY (Patient taking differently: Take 1 mg by mouth daily. ) 30 tablet 0 unknown at unknown  . mirtazapine (REMERON) 30 MG tablet Take 0.5 tablets (15 mg total) by mouth at bedtime. 30 tablet 0 unknown at unknown  . pantoprazole (PROTONIX) 40 MG tablet TAKE 1 TABLET DAILY AT 7:00 AM ON AN EMPTY STOMACH (Patient taking differently: Take 40 mg by mouth daily. ) 30 tablet 0 unknown at unknown  . sulfamethoxazole-trimethoprim (BACTRIM DS) 800-160 MG tablet Take 1 tablet by mouth 2 (two) times daily.   unknown at unknown  . tamsulosin (FLOMAX) 0.4 MG CAPS capsule TAKE 1 CAPSULE BY MOUTH DAILY (Patient taking differently: Take 0.4 mg by mouth daily. ) 30 capsule 0 unknown at unknown  . thiamine 100 MG tablet Take 1 tablet (100 mg total) by mouth daily. 30 tablet 1 unknown at unknown  . traZODone (DESYREL)  100 MG tablet Take 1 tablet (100 mg total) by mouth at bedtime. 30 tablet 3 unknown at unknown  . ALPRAZolam (XANAX) 0.25 MG tablet Take 1 tablet (0.25 mg total) by mouth 2 (two) times daily as needed for anxiety. (Patient not taking: Reported on 08/07/2018) 12 tablet 0     Assessment:Pharmacy consulted for heparin drip management for [name] admitted to ICU with acute hypoxic respiratory distress secondary to CHF and COPD exacerbation. Patient with atrial fibrillation with RVR on admission and was previously on apixaban 2.5 mg BID. Heparin 5000 units [place] given this morning.   Goal of Therapy:  Heparin level 0.3-0.7 units/mL when aPTT correlate. Goal aPTT: 66-102 seconds Monitor platelets by anticoagulation protocol: Yes   Plan:  Based on timing of last apixaban dose and AM [place] heparin dose, will not be adding bolus at this time. Initiate heparin at 750 units/hr. Obtain aPTT at 1830 and will check initial anti-Xa with 8/19 AM  labs (last dose of apixaban 8/16).  8/17:  APTT @ 1830 = 44 Will increase heparin drip to 850 units/hr and recheck HL 8 hrs after rate change.   Pharmacy will continue to monitor and adjust per consult.  Raquel Sayres D 08/24/2018,9:20 PM

## 2018-08-24 NOTE — Consult Note (Addendum)
PULMONARY / CRITICAL CARE MEDICINE  Name: Justin Andrews MRN: [id number] DOB: [birth year]    LOS: 1  Referring Provider: Dr. Brett Albino Reason for Referral: Acute hypoxic respiratory Brief patient description: 84 year old male presenting with acute hypoxic respiratory failure secondary to acute CHF exacerbation and COPD exacerbation  HPI: This is an 84 year old male with a history of EtOH abuse, CKD, left hip fracture, skin cancer, GERD, hypertension and tobacco use disorder who presented to the ED with worsening lower extremity edema and shortness of breath.  When EMS arrived, his SPO2 was in the 80s.  He was placed on 4 L nasal cannula and transported to the ED.  At the ED he was placed on BiPAP.  His ED work-up was significant for diffuse pulmonary edema and questionable airspace disease at the right lung base.  His BNP was 1820 and his creatinine was 2.68 which is mildly elevated from his baseline of 2.17 on 07/31/2018.  His EKG showed atrial fibrillation which is assumed to be new onset as he does not have a history.  Is being admitted to the ICU for further management.  He reports mild abdominal pain, and dyspnea but denies any chest pain palpitations, dizziness and headache. Of note, patient was in the ED on 08/17/2018 with left ankle pain after a mechanical fall with a syncopal episode that occurred 4 days prior.  He was evaluated and discharged. He also has a history of melanoma and is being followed by Dr.Miedema.  Patient has not opted for any aggressive treatment yet.  He has a melanoma on history that per dermatology is beginning to invade the surrounding tissues.  Past Medical History:  Diagnosis Date  . C. difficile diarrhea 11/2017  . Cancer ([place])    skin  . Depression   . Elevated serum creatinine   . GERD (gastroesophageal reflux disease)   . History of melanoma   . Hypertension   . Osteoarthritis   . Tobacco abuse    Past Surgical History:  Procedure Laterality Date  . ANKLE  FRACTURE SURGERY    . CATARACT EXTRACTION W/PHACO Right 08/13/2018   Procedure: CATARACT EXTRACTION PHACO AND INTRAOCULAR LENS PLACEMENT ([place]) RIGHT [place];  Surgeon: Marchia Meiers, MD;  Location: [facility];  Service: Ophthalmology;  Laterality: Right;  [place]  01:16 CDE 13.15 Fluid pack lot # [id number]  . CENTRAL VENOUS CATHETER INSERTION  05/08/2018   Procedure: INSERTION CENTRAL LINE ADULT;  Surgeon: Olean Ree, MD;  Location: [facility];  Service: General;;  . ESOPHAGOGASTRODUODENOSCOPY  05/08/2018   Procedure: ESOPHAGOGASTRODUODENOSCOPY (EGD);  Surgeon: Olean Ree, MD;  Location: [facility];  Service: General;;  . ESOPHAGOGASTRODUODENOSCOPY (EGD) WITH PROPOFOL N/A 05/03/2018   Procedure: ESOPHAGOGASTRODUODENOSCOPY (EGD) WITH PROPOFOL;  Surgeon: Toledo, Benay Pike, MD;  Location: [facility];  Service: Gastroenterology;  Laterality: N/A;  . EXTERNAL EAR SURGERY    . INTRAMEDULLARY (IM) NAIL INTERTROCHANTERIC Left 12/01/2017   Procedure: INTRAMEDULLARY (IM) NAIL INTERTROCHANTRIC;  Surgeon: Thornton Park, MD;  Location: [facility];  Service: Orthopedics;  Laterality: Left;  . JEJUNOSTOMY  05/08/2018   Procedure: JEJUNOSTOMY;  Surgeon: Olean Ree, MD;  Location: [facility];  Service: General;;  . LAPAROTOMY N/A 05/08/2018   Procedure: EXPLORATORY LAPAROTOMY;  Surgeon: Olean Ree, MD;  Location: [facility];  Service: General;  Laterality: N/A;  . ROTATOR CUFF REPAIR    . TONSILLECTOMY    . TYMPANOPLASTY W/ MASTOIDECTOMY     Patient with reports of mastoid surgery (appears to have had surgery on TM; Left).  Marland Kitchen  VISCERAL ARTERY INTERVENTION N/A 05/04/2018   Procedure: VISCERAL ARTERY INTERVENTION;  Surgeon: Algernon Huxley, MD;  Location: [facility];  Service: Cardiovascular;  Laterality: N/A;   No current facility-administered medications on file prior to encounter.    Current Outpatient Medications on File Prior to Encounter  Medication Sig  . calcium-vitamin D (OSCAL WITH D)  500-200 MG-UNIT tablet Take 1 tablet by mouth 2 (two) times daily.  Marland Kitchen doxycycline (VIBRA-TABS) 100 MG tablet Take 1 tablet (100 mg total) by mouth 2 (two) times daily.  . DUREZOL 0.05 % EMUL Place 1 drop into the right eye daily.  . FEROSUL 325 (65 Fe) MG tablet TAKE 1 TABLET BY MOUTH ONCE DAILY FOR SUPPLEMENT (Patient taking differently: Take 325 mg by mouth daily. )  . folic acid (FOLVITE) 1 MG tablet TAKE 1 TABLET BY MOUTH DAILY (Patient taking differently: Take 1 mg by mouth daily. )  . mirtazapine (REMERON) 30 MG tablet Take 0.5 tablets (15 mg total) by mouth at bedtime.  . pantoprazole (PROTONIX) 40 MG tablet TAKE 1 TABLET DAILY AT 7:00 AM ON AN EMPTY STOMACH (Patient taking differently: Take 40 mg by mouth daily. )  . sulfamethoxazole-trimethoprim (BACTRIM DS) 800-160 MG tablet Take 1 tablet by mouth 2 (two) times daily.  . tamsulosin (FLOMAX) 0.4 MG CAPS capsule TAKE 1 CAPSULE BY MOUTH DAILY (Patient taking differently: Take 0.4 mg by mouth daily. )  . thiamine 100 MG tablet Take 1 tablet (100 mg total) by mouth daily.  . traZODone (DESYREL) 100 MG tablet Take 1 tablet (100 mg total) by mouth at bedtime.  . ALPRAZolam (XANAX) 0.25 MG tablet Take 1 tablet (0.25 mg total) by mouth 2 (two) times daily as needed for anxiety. (Patient not taking: Reported on 08/07/2018)    Allergies Allergies  Allergen Reactions  . Aspirin Other (See Comments)    bleeding    Family History Family History  Problem Relation Age of Onset  . Prostate cancer Neg Hx   . Bladder Cancer Neg Hx   . Kidney cancer Neg Hx    Social History  reports that he has been smoking cigarettes. He has a 70.00 pack-year smoking history. He has never used smokeless tobacco. He reports that he does not drink alcohol or use drugs.  Review Of Systems: Unable to obtain as patient is currently on continuous BiPAP  VITAL SIGNS: BP 131/68   Pulse 97   Temp 97.7 F (36.5 C) (Axillary)   Resp (!) 23   Ht 5\' 7"  (1.702 m)    Wt 54.7 kg   SpO2 98%   BMI 18.89 kg/m   HEMODYNAMICS:    VENTILATOR SETTINGS: FiO2 (%):  [30 %] 30 %  INTAKE / OUTPUT: No intake/output data recorded.  PHYSICAL EXAMINATION: General: Chronically ill looking, in moderate respiratory distress HEENT: PERRLA, mild JVD, trachea midline Neuro: Alert and oriented x3, no focal deficits Cardiovascular: Apical pulse regular, S1-S2, no murmur regurg or gallop, +2 pulses bilaterally, +2 nonpitting edema in bilateral lower extremities Lungs: Increased work of breathing, diffuse crackles in all lung fields, breath sounds diminished in bases, right lower lobe Rales Abdomen: Nondistended, normal bowel sounds, gentle palpation reveals right lower and left lower abdominal quadrant pain Musculoskeletal: No joint deformities Skin: Erythematous discoloration in bilateral lower extremities, small bruises in bilateral knees, left temporal area with a demarcated discoloration.  LABS:  BMET Recent Labs  Lab 08/23/18 1703 08/24/18 0333  NA 137 139  K 4.1 4.1  CL  100 97*  CO2 23 24  BUN 35* 41*  CREATININE 2.68* 2.76*  GLUCOSE 126* 134*    Electrolytes Recent Labs  Lab 08/23/18 1703 08/23/18 2212 08/24/18 0333  CALCIUM 9.4  --  8.7*  MG  --  2.3  --   PHOS  --  4.6  --     CBC Recent Labs  Lab 08/23/18 1703 08/24/18 0333  WBC 7.0 4.0  HGB 11.3* 9.8*  HCT 35.2* 30.8*  PLT 249 228    Coag's Recent Labs  Lab 08/23/18 1703  APTT 29  INR 1.0    Sepsis Markers Recent Labs  Lab 08/23/18 1703 08/23/18 2212  LATICACIDVEN 1.5  --   PROCALCITON  --  <0.10    ABG No results for input(s): PHART, PCO2ART, PO2ART in the last 168 hours.  Liver Enzymes Recent Labs  Lab 08/23/18 1703  AST 37  ALT 34  ALKPHOS 116  BILITOT 0.8  ALBUMIN 3.8    Cardiac Enzymes No results for input(s): TROPONINI, PROBNP in the last 168 hours.  Glucose No results for input(s): GLUCAP in the last 168 hours.  Imaging Dg Abd 1  View  Result Date: 08/23/2018 CLINICAL DATA:  Abdominal pain EXAM: ABDOMEN - 1 VIEW COMPARISON:  CT May 28, 2018, radiograph May 09, 2018 FINDINGS: Coil pack projects over the upper abdomen. Numerous nonspecific air-filled loops of small bowel are present, clustered in the mid abdomen. Air and stool overlies the rectal vault. No obstructive bowel gas pattern is identified. Numerous vascular calcifications project over the abdomen with scattered phleboliths present in the pelvis as well. Prior left femoral intramedullary nail and transcervical interference screw placement with extensive hypertrophic osseous changes of the proximal left femur. Findings are similar to prior. Additional degenerative changes in the lower spine and pelvic girdle. Multifocal patchy airspace opacities are present throughout the lungs with the new right pleural effusion. IMPRESSION: Multifocal patchy airspace opacities throughout the lungs with the new right pleural effusion, concerning for multifocal pneumonia. Cluster of air-filled but nondistended loops of small bowel in the mid abdomen, nonspecific but could reflect developing ileus in the appropriate clinical setting. Electronically Signed   By: Lovena Le M.D.   On: 08/23/2018 21:57   Dg Chest Portable 1 View  Result Date: 08/23/2018 CLINICAL DATA:  Shortness of breath. EXAM: PORTABLE CHEST 1 VIEW COMPARISON:  Multiple prior chest x-rays, most recently dated July 31, 2018. FINDINGS: The heart size and mediastinal contours are within normal limits. Atherosclerotic calcification of the aortic arch. New pulmonary vascular congestion and diffuse interstitial thickening. The lungs remain hyperinflated with emphysematous changes. New patchy opacities at the right lung base. New small right pleural effusion. No pneumothorax. No acute osseous abnormality. Old bilateral rib fractures. IMPRESSION: 1. New diffuse interstitial pulmonary edema and small right pleural effusion. 2. New patchy  airspace disease at the right lung base suspicious for pneumonia/aspiration. 3. COPD. Electronically Signed   By: Titus Dubin M.D.   On: 08/23/2018 18:00   STUDIES:  2D echo pending  CULTURES: Blood cultures x2  ANTIBIOTICS: Azithromycin Ceftriaxone  SIGNIFICANT EVENTS: 08/23/2018: Admitted  LINES/TUBES: Peripheral IVs  DISCUSSION: 84 year old male presenting with acute hypoxic respiratory failure secondary to CHF exacerbation and COPD exacerbation; and right lower lobe airspace disease on chest x-ray suggestive of pneumonia  ASSESSMENT  Acute hypoxic respiratory failure Acute CHF exacerbation Acute pulmonary edema Acute COPD exacerbation New onset atrial fibrillation Questionable right lower lobe pneumonia-presentation favors more fluid than infectious process Tobacco  use disorder Hypertension Untreated melanoma Abdominal pain  PLAN Continues BiPAP and titrate to nasal cannula as tolerated Nebulized bronchodilators IV Lasix IV steroids 2D echo Cardiology consult pending We will discontinue Eliquis in light of patient's history of multiple falls Trend procalcitonin and discontinue antibiotics if normal Start abdominal x-ray reviewed with no acute changes, abdominal pain improved with bladder emptying Resume all home medication Palliative care consult for goals of care  Best Practice: Code Status: Full code Diet: Heart healthy GI prophylaxis: Protonix 40 mg daily VTE prophylaxis: Subcu heparin and SCDs  FAMILY  - Updates: Family updated by admitting service.  Will update with any new changes in treatment plan  Justin Andrews ANP-BC Pulmonary and [place] Pager [phone] or [phone]  NB: This document was prepared using Dragon voice recognition software and may include unintentional dictation errors.    08/24/2018, 4:44 AM

## 2018-08-24 NOTE — Progress Notes (Signed)
[employer] approved by RN for patient's lunch order.

## 2018-08-24 NOTE — Progress Notes (Signed)
[place] for heparin Indication: atrial fibrillation  Allergies  Allergen Reactions  . Aspirin Other (See Comments)    bleeding    Patient Measurements: Height: 5\' 7"  (170.2 cm) Weight: 120 lb 9.5 oz (54.7 kg) IBW/kg (Calculated) : 66.1    Vital Signs: Temp: 98 F (36.7 C) (08/17 0800) Temp Source: Oral (08/17 0800) BP: 124/56 (08/17 1400) Pulse Rate: 87 (08/17 1400)  Labs: Recent Labs    08/23/18 1703 08/23/18 2212 08/24/18 0333 08/24/18 1036  HGB 11.3*  --  9.8*  --   HCT 35.2*  --  30.8*  --   PLT 249  --  228  --   APTT 29  --   --  29  LABPROT 13.4  --   --   --   INR 1.0  --   --   --   CREATININE 2.68*  --  2.76*  --   TROPONINIHS 69* 59*  --   --     Estimated Creatinine Clearance: 16 mL/min (A) (by C-G formula based on SCr of 2.76 mg/dL (H)).   Medical History: Past Medical History:  Diagnosis Date  . Alcohol abuse   . C. difficile diarrhea 11/2017  . Chronic kidney disease (CKD), stage III (moderate) ([facility])   . Depression   . GERD (gastroesophageal reflux disease)   . GI bleed    a. bleeding duodenal ulcers in 04/2018 s/p cauterization and duodenalplasty  . History of melanoma   . Hypertension   . Osteoarthritis   . PAF (paroxysmal atrial fibrillation) ([place])    a. initially noted on 05/06/2018  . Tobacco abuse     Medications:  Scheduled:  . calcium-vitamin D  1 tablet Oral BID  . chlorhexidine  15 mL Mouth Rinse BID  . Chlorhexidine Gluconate Cloth  6 each Topical Daily  . Difluprednate  1 drop Right Eye Daily  . ferrous sulfate  325 mg Oral Daily  . folic acid  1 mg Oral Daily  . furosemide  40 mg Intravenous BID  . ipratropium-albuterol  3 mL Nebulization Q6H  . mouth rinse  15 mL Mouth Rinse q12n4p  . metoprolol tartrate  2.5 mg Intravenous Q6H  . metoprolol tartrate  5 mg Intravenous Once  . mirtazapine  15 mg Oral QHS  . multivitamin with minerals  1 tablet Oral Daily  . pantoprazole  40 mg  Oral Daily  . polyethylene glycol  17 g Oral Daily  . tamsulosin  0.4 mg Oral Daily  . thiamine  100 mg Oral Daily  . traZODone  100 mg Oral QHS   Infusions:  . heparin 750 Units/hr (08/24/18 1516)    Assessment: Pharmacy consulted for heparin drip management for [name] admitted to ICU with acute hypoxic respiratory distress secondary to CHF and COPD exacerbation. Patient with atrial fibrillation with RVR on admission and was previously on apixaban 2.5 mg BID. Heparin 5000 units [place] given this morning.   Goal of Therapy:  Heparin level 0.3-0.7 units/mL when aPTT correlate. Goal aPTT: 66-102 seconds Monitor platelets by anticoagulation protocol: Yes   Plan:  Based on timing of last apixaban dose and AM [place] heparin dose, will not be adding bolus at this time. Initiate heparin at 750 units/hr. Obtain aPTT at 1830 and will check initial anti-Xa with 8/19 AM labs (last dose of apixaban 8/16).  Pharmacy will continue to monitor and adjust per consult.   Marlia Schewe Dear Nicholes Mango 08/24/2018,2:50 PM

## 2018-08-24 NOTE — Progress Notes (Signed)
Name: Justin Andrews MRN: [id number] DOB: October 05, 1934     CONSULTATION DATE: 08/23/2018  CHIEF COMPLAINT:  Shortness of breath  HISTORY OF PRESENT ILLNESS:  Briefly, this is a 84 year old male with history of Alcohol abuse, C.diff, CKD (stg III), depression, GERD, hx of GI bleed - 04/2018, melanoma in citu, HTN, OA, PAF - 04/2018, and tobacco use seen here in the ICU for further mangement of acute hypoxic respiratory distress in the setting of likely CHF and COPD exacerbation. Patient notes that he has been experiencing worsening SOB x 3 weeks w/o cough. EMS was called, and the patient was transported from his living facility to the ED.  In route he was hypoxic, with O2 saturation in the 80's. In the ED, he started on 4 L O2, and was transitioned to BiPAP after continued respiratory distress. Initial labs indicated acute on chronic kidney injury, with Cr 2.68 and GFR at 21, BNP 1820. Initial CXR was concerning for new diffuse pulmonary edema, with small right pleural effusion, and patchy airspace disease at right lung base w/ concern for pneumonia vs. aspiration. Patient was admitted to the ICU on BIPAP, antibiotics and Lasix for further management. He is being followed by Cardiology for assistance in managing Afib.  Of note, patient has been seen recently s/p fall for L ankle pain s/p fall.   Today, he notes his shortness of breath has improved. He is comfortable on room air, with O2 saturations in the 93-96 range, and prefers not to wear a nasal cannula. He endorses constipation, lower extremity edema, and L knee pain and localized swelling. However, his L ankle was evaluated and imaged in the ED on 08/17/2018. He denies chest pain, palpitations, dizziness, wheezing, abdominal pain.   PAST MEDICAL HISTORY :   has a past medical history of Alcohol abuse, C. difficile diarrhea (11/2017), Chronic kidney disease (CKD), stage III (moderate) ([place]), Depression, GERD (gastroesophageal reflux disease), GI bleed,  History of melanoma, Hypertension, Osteoarthritis, PAF (paroxysmal atrial fibrillation) ([place]), and Tobacco abuse.  has a past surgical history that includes External ear surgery; Tonsillectomy; Rotator cuff repair; Ankle fracture surgery; Tympanoplasty w/ mastoidectomy; Intramedullary (im) nail intertrochanteric (Left, 12/01/2017); Esophagogastroduodenoscopy (egd) with propofol (N/A, 05/03/2018); VISCERAL ARTERY INTERVENTION (N/A, 05/04/2018); laparotomy (N/A, 05/08/2018); Central venous catheter insertion (05/08/2018); Esophagogastroduodenoscopy (05/08/2018); Jejunostomy (05/08/2018); and Cataract extraction w/PHACO (Right, 08/13/2018). Prior to Admission medications   Medication Sig Start Date End Date Taking? Authorizing Provider  calcium-vitamin D (OSCAL WITH D) 500-200 MG-UNIT tablet Take 1 tablet by mouth 2 (two) times daily. 07/16/18  Yes Leone Haven, MD  doxycycline (VIBRA-TABS) 100 MG tablet Take 1 tablet (100 mg total) by mouth 2 (two) times daily. 08/04/18  Yes Guse, Jacquelynn Cree, FNP  DUREZOL 0.05 % EMUL Place 1 drop into the right eye daily. 08/21/18  Yes [provider]  FEROSUL 325 (65 Fe) MG tablet TAKE 1 TABLET BY MOUTH ONCE DAILY FOR SUPPLEMENT Patient taking differently: Take 325 mg by mouth daily.  07/14/18  Yes Leone Haven, MD  folic acid (FOLVITE) 1 MG tablet TAKE 1 TABLET BY MOUTH DAILY Patient taking differently: Take 1 mg by mouth daily.  07/14/18  Yes Leone Haven, MD  mirtazapine (REMERON) 30 MG tablet Take 0.5 tablets (15 mg total) by mouth at bedtime. 06/17/18  Yes Wieting, Richard, MD  pantoprazole (PROTONIX) 40 MG tablet TAKE 1 TABLET DAILY AT 7:00 AM ON AN EMPTY STOMACH Patient taking differently: Take 40 mg by mouth daily.  07/14/18  Yes Leone Haven, MD  sulfamethoxazole-trimethoprim (BACTRIM DS) 800-160 MG tablet Take 1 tablet by mouth 2 (two) times daily. 08/18/18  Yes [provider]  tamsulosin (FLOMAX) 0.4 MG CAPS capsule TAKE 1 CAPSULE BY MOUTH  DAILY Patient taking differently: Take 0.4 mg by mouth daily.  07/14/18  Yes Leone Haven, MD  thiamine 100 MG tablet Take 1 tablet (100 mg total) by mouth daily. 07/16/18  Yes Leone Haven, MD  traZODone (DESYREL) 100 MG tablet Take 1 tablet (100 mg total) by mouth at bedtime. 07/27/18  Yes Leone Haven, MD  ALPRAZolam Duanne Moron) 0.25 MG tablet Take 1 tablet (0.25 mg total) by mouth 2 (two) times daily as needed for anxiety. Patient not taking: Reported on 08/07/2018 07/14/18   Leone Haven, MD   Allergies  Allergen Reactions  . Aspirin Other (See Comments)    bleeding   REVIEW OF SYSTEMS:   Review of Systems  Constitutional: Negative for chills and fever.  HENT: Positive for hearing loss (at baseline. L >R).   Respiratory: Positive for shortness of breath. Negative for cough, sputum production and wheezing.   Cardiovascular: Positive for leg swelling. Negative for chest pain, palpitations and orthopnea.  Gastrointestinal: Positive for constipation. Negative for abdominal pain, diarrhea, nausea and vomiting.  Neurological: Negative for dizziness.   VITAL SIGNS: Temp:  [97.4 F (36.3 C)-98 F (36.7 C)] 98 F (36.7 C) (08/17 0800) Pulse Rate:  [61-128] 93 (08/17 0900) Resp:  [14-35] 20 (08/17 0900) BP: (105-175)/(52-99) 126/62 (08/17 0900) SpO2:  [91 %-100 %] 99 % (08/17 0900) FiO2 (%):  [30 %] 30 % (08/17 0530) Weight:  [54.4 kg-54.7 kg] 54.7 kg (08/16 2100)  I/O last 3 completed shifts: In: 250 [IV Piggyback:250] Out: 1400 [Urine:1400] Total I/O In: 480 [P.O.:480] Out: 50 [Urine:50]  SpO2: 99 % O2 Flow Rate (L/min): 2 L/min FiO2 (%): 30 %  Physical Examination:  GENERAL: Thin. In no acute distress. Sitting comfortably.  HEAD: Normocephalic, atraumatic.  EYES: Pupils equal, round, reactive to light.  No scleral icterus.  MOUTH: Moist mucosal membrane. NECK: + JVD  PULMONARY: Accessory muscle use, with crackles, R >L. CARDIOVASCULAR: S1 and S2. Regular  rate and rhythm. No murmurs, rubs, or gallops appreciated. GASTROINTESTINAL: Soft, nontender, non-distended. No masses. Hyperactive bowel sounds.  EXTREMITIES: 1+ edema to mid calf w/ venous stasis skin changes bilaterally. 2+ DP pulses bilaterally.   Left knee with scattered abrasions. No obvious unilateral swelling of L knee/ankle. Strength grossly intact at calf, with pain with manipulation. NEUROLOGIC: Alert and oriented x 3. SKIN:intact, warm, dry.  I personally reviewed lab work that was obtained in last 24 hrs. MEDICATIONS: I have reviewed all medications and confirmed regimen as documented CBC Latest Ref Rng & Units 08/24/2018 08/23/2018 07/31/2018  WBC 4.0 - 10.5 K/uL 4.0 7.0 9.0  Hemoglobin 13.0 - 17.0 g/dL 9.8(L) 11.3(L) 8.9(L)  Hematocrit 39.0 - 52.0 % 30.8(L) 35.2(L) 27.5(L)  Platelets 150 - 400 K/uL 228 249 290   CMP Latest Ref Rng & Units 08/24/2018 08/23/2018 07/31/2018  Glucose 70 - 99 mg/dL 134(H) 126(H) 128(H)  BUN 8 - 23 mg/dL 41(H) 35(H) 27(H)  Creatinine 0.61 - 1.24 mg/dL 2.76(H) 2.68(H) 2.17(H)  Sodium 135 - 145 mmol/L 139 137 139  Potassium 3.5 - 5.1 mmol/L 4.1 4.1 3.5  Chloride 98 - 111 mmol/L 97(L) 100 110  CO2 22 - 32 mmol/L 24 23 22   Calcium 8.9 - 10.3 mg/dL 8.7(L) 9.4 9.0  Total  Protein 6.5 - 8.1 g/dL - 7.2 6.8  Total Bilirubin 0.3 - 1.2 mg/dL - 0.8 0.6  Alkaline Phos 38 - 126 U/L - 116 69  AST 15 - 41 U/L - 37 15  ALT 0 - 44 U/L - 34 12   CULTURE RESULTS   Recent Results (from the past 240 hour(s))  SARS Coronavirus 2 [facility] order, Performed in [facility] hospital lab) Nasopharyngeal Nasopharyngeal Swab     Status: None   Collection Time: 08/23/18  5:03 PM   Specimen: Nasopharyngeal Swab  Result Value Ref Range Status   SARS Coronavirus 2 NEGATIVE NEGATIVE Final    Comment: (NOTE) If result is NEGATIVE SARS-CoV-2 target nucleic acids are NOT DETECTED. The SARS-CoV-2 RNA is generally detectable in upper and lower  respiratory specimens during the  acute phase of infection. The lowest  concentration of SARS-CoV-2 viral copies this assay can detect is 250  copies / mL. A negative result does not preclude SARS-CoV-2 infection  and should not be used as the sole basis for treatment or other  patient management decisions.  A negative result may occur with  improper specimen collection / handling, submission of specimen other  than nasopharyngeal swab, presence of viral mutation(s) within the  areas targeted by this assay, and inadequate number of viral copies  (<250 copies / mL). A negative result must be combined with clinical  observations, patient history, and epidemiological information. If result is POSITIVE SARS-CoV-2 target nucleic acids are DETECTED. The SARS-CoV-2 RNA is generally detectable in upper and lower  respiratory specimens dur ing the acute phase of infection.  Positive  results are indicative of active infection with SARS-CoV-2.  Clinical  correlation with patient history and other diagnostic information is  necessary to determine patient infection status.  Positive results do  not rule out bacterial infection or co-infection with other viruses. If result is PRESUMPTIVE POSTIVE SARS-CoV-2 nucleic acids MAY BE PRESENT.   A presumptive positive result was obtained on the submitted specimen  and confirmed on repeat testing.  While 2019 novel coronavirus  (SARS-CoV-2) nucleic acids may be present in the submitted sample  additional confirmatory testing may be necessary for epidemiological  and / or clinical management purposes  to differentiate between  SARS-CoV-2 and other Sarbecovirus currently known to infect humans.  If clinically indicated additional testing with an alternate test  methodology [phone]) is advised. The SARS-CoV-2 RNA is generally  detectable in upper and lower respiratory sp ecimens during the acute  phase of infection. The expected result is Negative. Fact Sheet for Patients:   StrictlyIdeas.no Fact Sheet for Healthcare Providers: BankingDealers.co.za This test is not yet approved or cleared by the [place] FDA and has been authorized for detection and/or diagnosis of SARS-CoV-2 by FDA under an Emergency Use Authorization (EUA).  This EUA will remain in effect (meaning this test can be used) for the duration of the COVID-19 declaration under Section 564(b)(1) of the Act, 21 U.S.C. section 360bbb-3(b)(1), unless the authorization is terminated or revoked sooner. Performed at [facility], [address] [place], [place] [address]   Blood culture (routine x 2)     Status: None (Preliminary result)   Collection Time: 08/23/18  5:03 PM   Specimen: BLOOD  Result Value Ref Range Status   Specimen Description BLOOD RAC  Final   Special Requests   Final    BOTTLES DRAWN AEROBIC AND ANAEROBIC Blood Culture adequate volume   Culture   Final  NO GROWTH < 24 HOURS Performed at [facility], [address] [place], [place] [address]    Report Status PENDING  Incomplete  Blood culture (routine x 2)     Status: None (Preliminary result)   Collection Time: 08/23/18  5:03 PM   Specimen: BLOOD  Result Value Ref Range Status   Specimen Description BLOOD R F ARM  Final   Special Requests   Final    BOTTLES DRAWN AEROBIC AND ANAEROBIC Blood Culture adequate volume   Culture   Final    NO GROWTH < 24 HOURS Performed at [facility], [address], [place], [place] [address]    Report Status PENDING  Incomplete  MRSA PCR Screening     Status: None   Collection Time: 08/23/18  9:32 PM   Specimen: Nasopharyngeal  Result Value Ref Range Status   MRSA by PCR NEGATIVE NEGATIVE Final    Comment:        The GeneXpert MRSA Assay (FDA approved for NASAL specimens only), is one component of a comprehensive MRSA colonization surveillance program. It is not intended to diagnose MRSA  infection nor to guide or monitor treatment for MRSA infections. Performed at [facility], [address] [place], [place] [address]    IMAGING   CXR Independently reviewed-Dr Alva Garnet    Dg Abd 1 View  Result Date: 08/23/2018 CLINICAL DATA:  Abdominal pain EXAM: ABDOMEN - 1 VIEW COMPARISON:  CT May 28, 2018, radiograph May 09, 2018 FINDINGS: Coil pack projects over the upper abdomen. Numerous nonspecific air-filled loops of small bowel are present, clustered in the mid abdomen. Air and stool overlies the rectal vault. No obstructive bowel gas pattern is identified. Numerous vascular calcifications project over the abdomen with scattered phleboliths present in the pelvis as well. Prior left femoral intramedullary nail and transcervical interference screw placement with extensive hypertrophic osseous changes of the proximal left femur. Findings are similar to prior. Additional degenerative changes in the lower spine and pelvic girdle. Multifocal patchy airspace opacities are present throughout the lungs with the new right pleural effusion. IMPRESSION: Multifocal patchy airspace opacities throughout the lungs with the new right pleural effusion, concerning for multifocal pneumonia. Cluster of air-filled but nondistended loops of small bowel in the mid abdomen, nonspecific but could reflect developing ileus in the appropriate clinical setting. Electronically Signed   By: Lovena Le M.D.   On: 08/23/2018 21:57   Dg Chest Portable 1 View  Result Date: 08/23/2018 CLINICAL DATA:  Shortness of breath. EXAM: PORTABLE CHEST 1 VIEW COMPARISON:  Multiple prior chest x-rays, most recently dated July 31, 2018. FINDINGS: The heart size and mediastinal contours are within normal limits. Atherosclerotic calcification of the aortic arch. New pulmonary vascular congestion and diffuse interstitial thickening. The lungs remain hyperinflated with emphysematous changes. New patchy opacities at the right lung  base. New small right pleural effusion. No pneumothorax. No acute osseous abnormality. Old bilateral rib fractures. IMPRESSION: 1. New diffuse interstitial pulmonary edema and small right pleural effusion. 2. New patchy airspace disease at the right lung base suspicious for pneumonia/aspiration. 3. COPD. Electronically Signed   By: Titus Dubin M.D.   On: 08/23/2018 18:00   ASSESSMENT AND PLAN SYNOPSIS: 84 year old male with history as stated above seen here in the ICU for acute hypoxic respiratory distress/failure, in the setting of possible CHF and COPD exacerbation. Patient is currently stable on room air. He is afebrile, and his labs are not consistent with infectious process (  no leukocytosis, unremarkable LA, negative blood cultures, PCT < 0.10).   Improving ACUTE Hypoxic and Hypercapnic Respiratory Failure likely secondary to CHF and COPD exacerbation - Continue bronchodilator therapy with Duoneb. - Continue Solumedrol. - Will continue with O2 via [place], as tolerated. Will order BiPAP PRN.   - Antibiotics (ceftriaxone, azithromycin) discontinued.   Likely decompensated HF - Cardiology following. - ECHO ordered, to determine etiology.  - Lasix as tolerated. - follow up cardiology recs.   ACUTE on CHRONIC KIDNEY INJURY/Renal Failure - follow chem 7 and electrolytes - follow UO - Replace electrolytes PRN. Pharmacy consultation and following.  - continue External urinary catheter - Avoid nephrotoxic agents.  Alcohol abuse - CIWA protocol in place. - Ativan PRN.   Paroxysmal Atrial fibrillation - now NSR - Cardiology following.  - Continue rate control with Metoprolol. - Continue heparin. Cardiology recommends transition to Eliquis s/p discharge, if Hgb stable.  - ICU monitoring.   GI GI PROPHYLAXIS as indicated  NUTRITIONAL STATUS DIET--> heart healthy Constipation protocol as indicated  ENDO - will use ICU hypoglycemic\Hyperglycemia protocol if needed  DVT/GI PRX  ordered - heparin, protonix TRANSFUSIONS AS NEEDED MONITOR FSBS ASSESS the need for [place], PA student  PCCM ATTENDING ATTESTATION: I have evaluated patient with the PA student [place], reviewed database in its entirety and discussed care plan in detail. In addition, this patient was discussed on multidisciplinary rounds. I have personally reviewed all chest radiographs discussed herein including CXRs and CT chest unless otherwise indicated.   Important exam findings: NAD on [place] O2 Frail appearing HEENT: NCAT, sclerae white Neck: Supple, JVP not visualized Chest: Bibasilar crackles, no wheezes Cardiac: IRIR, tachy, distant heart sounds, no M noted Abdomen: Soft, NT, NABS Extremities: Warm, no edema  CXR: Edema pattern  Major problems addressed by PCCM team: Acute (on chronic) hypoxemic respiratory failure Acute pulmonary edema Doubt pneumonia Heavy smoker without prior diagnosis of COPD  No wheezing noted on exam this morning Documented history of alcohol abuse Documented history of melanoma (Care Everywhere)   PLAN/REC: Transfer to MedSurg with cardiac monitoring Discontinue antibiotics Discontinue systemic steroids Continue nebulized bronchodilators Further cardiac evaluation and management per cardiology Palliative care to see for goals of care    Merton Border, MD PCCM service Mobile [phone] Pager [phone] 08/24/2018 12:40 PM

## 2018-08-24 NOTE — Progress Notes (Signed)
[place] [facility] [place] NAME: Justin Andrews    MR#:  [id number]  DATE OF BIRTH:  02-Feb-1934  SUBJECTIVE:  CHIEF COMPLAINT:   Chief Complaint  Patient presents with  . Weakness  . Shortness of Breath   Shortness of breath is improved but still present.  On room air today. Poor historian overall REVIEW OF SYSTEMS:    Review of Systems  Constitutional: Positive for malaise/fatigue. Negative for chills and fever.  HENT: Negative for sore throat.   Eyes: Negative for blurred vision, double vision and pain.  Respiratory: Positive for shortness of breath. Negative for cough, hemoptysis and wheezing.   Cardiovascular: Negative for chest pain, palpitations, orthopnea and leg swelling.  Gastrointestinal: Negative for abdominal pain, constipation, diarrhea, heartburn, nausea and vomiting.  Genitourinary: Negative for dysuria and hematuria.  Musculoskeletal: Negative for back pain and joint pain.  Skin: Negative for rash.  Neurological: Negative for sensory change, speech change, focal weakness and headaches.  Endo/Heme/Allergies: Does not bruise/bleed easily.  Psychiatric/Behavioral: Negative for depression. The patient is not nervous/anxious.     DRUG ALLERGIES:   Allergies  Allergen Reactions  . Aspirin Other (See Comments)    bleeding    VITALS:  Blood pressure (!) 124/56, pulse 87, temperature 98 F (36.7 C), temperature source Oral, resp. rate 19, height 5\' 7"  (1.702 m), weight 54.7 kg, SpO2 (!) 87 %.  PHYSICAL EXAMINATION:   Physical Exam  GENERAL:  84 y.o.-year-old patient lying in the bed with no acute distress.  EYES: Pupils equal, round, reactive to light and accommodation. No scleral icterus. Extraocular muscles intact.  HEENT: Head atraumatic, normocephalic. Oropharynx and nasopharynx clear.  NECK:  Supple, no jugular venous distention. No thyroid enlargement, no tenderness.  LUNGS: Normal breath sounds bilaterally, no wheezing,  rales, rhonchi. No use of accessory muscles of respiration.  CARDIOVASCULAR: S1, S2 normal. No murmurs, rubs, or gallops.  ABDOMEN: Soft, nontender, nondistended. Bowel sounds present. No organomegaly or mass.  EXTREMITIES: No cyanosis, clubbing or edema b/l.    NEUROLOGIC: Cranial nerves II through XII are intact. No focal Motor or sensory deficits b/l.   PSYCHIATRIC: The patient is alert and awake SKIN: No obvious rash, lesion, or ulcer.   LABORATORY PANEL:   CBC Recent Labs  Lab 08/24/18 0333  WBC 4.0  HGB 9.8*  HCT 30.8*  PLT 228   ------------------------------------------------------------------------------------------------------------------ Chemistries  Recent Labs  Lab 08/23/18 1703 08/23/18 2212 08/24/18 0333  NA 137  --  139  K 4.1  --  4.1  CL 100  --  97*  CO2 23  --  24  GLUCOSE 126*  --  134*  BUN 35*  --  41*  CREATININE 2.68*  --  2.76*  CALCIUM 9.4  --  8.7*  MG  --  2.3  --   AST 37  --   --   ALT 34  --   --   ALKPHOS 116  --   --   BILITOT 0.8  --   --    ------------------------------------------------------------------------------------------------------------------  Cardiac Enzymes No results for input(s): TROPONINI in the last 168 hours. ------------------------------------------------------------------------------------------------------------------  RADIOLOGY:  Dg Abd 1 View  Result Date: 08/23/2018 CLINICAL DATA:  Abdominal pain EXAM: ABDOMEN - 1 VIEW COMPARISON:  CT May 28, 2018, radiograph May 09, 2018 FINDINGS: Coil pack projects over the upper abdomen. Numerous nonspecific air-filled loops of small bowel are present, clustered in the mid abdomen. Air and stool overlies the  rectal vault. No obstructive bowel gas pattern is identified. Numerous vascular calcifications project over the abdomen with scattered phleboliths present in the pelvis as well. Prior left femoral intramedullary nail and transcervical interference screw placement with  extensive hypertrophic osseous changes of the proximal left femur. Findings are similar to prior. Additional degenerative changes in the lower spine and pelvic girdle. Multifocal patchy airspace opacities are present throughout the lungs with the new right pleural effusion. IMPRESSION: Multifocal patchy airspace opacities throughout the lungs with the new right pleural effusion, concerning for multifocal pneumonia. Cluster of air-filled but nondistended loops of small bowel in the mid abdomen, nonspecific but could reflect developing ileus in the appropriate clinical setting. Electronically Signed   By: Lovena Le M.D.   On: 08/23/2018 21:57   Dg Chest Portable 1 View  Result Date: 08/23/2018 CLINICAL DATA:  Shortness of breath. EXAM: PORTABLE CHEST 1 VIEW COMPARISON:  Multiple prior chest x-rays, most recently dated July 31, 2018. FINDINGS: The heart size and mediastinal contours are within normal limits. Atherosclerotic calcification of the aortic arch. New pulmonary vascular congestion and diffuse interstitial thickening. The lungs remain hyperinflated with emphysematous changes. New patchy opacities at the right lung base. New small right pleural effusion. No pneumothorax. No acute osseous abnormality. Old bilateral rib fractures. IMPRESSION: 1. New diffuse interstitial pulmonary edema and small right pleural effusion. 2. New patchy airspace disease at the right lung base suspicious for pneumonia/aspiration. 3. COPD. Electronically Signed   By: Titus Dubin M.D.   On: 08/23/2018 18:00     ASSESSMENT AND PLAN:   Acute hypoxic respiratory failure secondary to combination of COPD and CHF. Weaned off BiPAP and now on room air.  *Acute on chronic congestive heart failure Echocardiogram pending for EF Continue Lasix. Monitor input and output. BMP in the morning Appreciate cardiology input.  *COPD.  Continue nebulizers and inhalers  *Paroxysmal atrial fibrillation Cardiology input  noted. Not a candidate for long-term anticoagulation.  Presently on heparin drip.  Elevated troponin- likely due to above Stable  AKI in CKD III-IV- creatinine is 2.68 (baseline 1.5-2.0) -Avoid nephrotoxic agents  Chronic iron deficiency anemia-hemoglobin at baseline -Continue home ferrous sulfate  Tobacco use -Nicotine patch as needed  Alcohol abuse -CIWA -Thiamine, folate, MVI  All the records are reviewed and case discussed with Care Management/Social Worker Management plans discussed with the patient, family and they are in agreement.  CODE STATUS: Full code  TOTAL TIME TAKING CARE OF THIS PATIENT: 35 minutes.   POSSIBLE D/C IN 1-2 DAYS, DEPENDING ON CLINICAL CONDITION.  Leia Alf Merrisa Skorupski M.D on 08/24/2018 at 2:10 PM  Between 7am to 6pm - Pager - [phone]  After 6pm go to [email or web address] - password [email or web address] [facility]  Office  [phone]  CC: Primary care physician; Leone Haven, MD  Note: This dictation was prepared with Dragon dictation along with smaller phrase technology. Any transcriptional errors that result from this process are unintentional.

## 2018-08-24 NOTE — Progress Notes (Signed)
After IV lopressor given, pt is not in NSR with rate of 82. MD aware.

## 2018-08-25 ENCOUNTER — Inpatient Hospital Stay: Payer: Medicare HMO

## 2018-08-25 ENCOUNTER — Inpatient Hospital Stay (HOSPITAL_COMMUNITY)
Admit: 2018-08-25 | Discharge: 2018-08-25 | Disposition: A | Discharge status: Home / Self Care | Payer: Medicare HMO | Attending: Adult Health | Admitting: Adult Health

## 2018-08-25 DIAGNOSIS — I361 Nonrheumatic tricuspid (valve) insufficiency: Secondary | ICD-10-CM

## 2018-08-25 DIAGNOSIS — I340 Nonrheumatic mitral (valve) insufficiency: Secondary | ICD-10-CM

## 2018-08-25 LAB — CBC
HCT: 30.7 % — ABNORMAL LOW (ref 39.0–52.0)
Hemoglobin: 9.9 g/dL — ABNORMAL LOW (ref 13.0–17.0)
MCH: 30.5 pg (ref 26.0–34.0)
MCHC: 32.2 g/dL (ref 30.0–36.0)
MCV: 94.5 fL (ref 80.0–100.0)
Platelets: 229 10*3/uL (ref 150–400)
RBC: 3.25 MIL/uL — ABNORMAL LOW (ref 4.22–5.81)
RDW: 17.4 % — ABNORMAL HIGH (ref 11.5–15.5)
WBC: 9.1 10*3/uL (ref 4.0–10.5)
nRBC: 0 % (ref 0.0–0.2)

## 2018-08-25 LAB — ECHOCARDIOGRAM COMPLETE
Height: 67 in
Weight: 1929.47 oz

## 2018-08-25 LAB — APTT
aPTT: 50 seconds — ABNORMAL HIGH (ref 24–36)
aPTT: 61 seconds — ABNORMAL HIGH (ref 24–36)

## 2018-08-25 LAB — BASIC METABOLIC PANEL
Anion gap: 10 (ref 5–15)
BUN: 63 mg/dL — ABNORMAL HIGH (ref 8–23)
CO2: 25 mmol/L (ref 22–32)
Calcium: 8.6 mg/dL — ABNORMAL LOW (ref 8.9–10.3)
Chloride: 100 mmol/L (ref 98–111)
Creatinine, Ser: 3.32 mg/dL — ABNORMAL HIGH (ref 0.61–1.24)
GFR calc Af Amer: 19 mL/min — ABNORMAL LOW (ref >60)
GFR calc non Af Amer: 16 mL/min — ABNORMAL LOW (ref >60)
Glucose, Bld: 124 mg/dL — ABNORMAL HIGH (ref 70–99)
Potassium: 4.1 mmol/L (ref 3.5–5.1)
Sodium: 135 mmol/L (ref 135–145)

## 2018-08-25 LAB — BRAIN NATRIURETIC PEPTIDE: B Natriuretic Peptide: 963 pg/mL — ABNORMAL HIGH (ref 0.0–100.0)

## 2018-08-25 MED ORDER — AMIODARONE HCL IN DEXTROSE 360-4.14 MG/200ML-% IV SOLN
30.0000 mg/h | INTRAVENOUS | Status: DC
  Administered 2018-08-25: 30 mg/h via INTRAVENOUS

## 2018-08-25 MED ORDER — TRAMADOL HCL 50 MG PO TABS
50.0000 mg | ORAL_TABLET | Freq: Four times a day (QID) | ORAL | Status: DC | PRN
  Administered 2018-08-25 – 2018-08-26 (×3): 100 mg via ORAL
  Filled 2018-08-25 (×3): qty 2

## 2018-08-25 MED ORDER — AMIODARONE HCL IN DEXTROSE 360-4.14 MG/200ML-% IV SOLN
30.0000 mg/h | INTRAVENOUS | Status: DC
  Administered 2018-08-25 (×2): 60 mg/h via INTRAVENOUS
  Filled 2018-08-25 (×2): qty 200

## 2018-08-25 MED ORDER — HEPARIN BOLUS VIA INFUSION
800.0000 [IU] | Freq: Once | INTRAVENOUS | Status: AC
  Administered 2018-08-25: 800 [IU] via INTRAVENOUS
  Filled 2018-08-25: qty 800

## 2018-08-25 MED ORDER — IPRATROPIUM-ALBUTEROL 0.5-2.5 (3) MG/3ML IN SOLN
3.0000 mL | RESPIRATORY_TRACT | Status: DC | PRN
  Administered 2018-08-25: 3 mL via RESPIRATORY_TRACT

## 2018-08-25 MED ORDER — METOPROLOL TARTRATE 25 MG PO TABS
12.5000 mg | ORAL_TABLET | Freq: Two times a day (BID) | ORAL | Status: DC
  Administered 2018-08-25 – 2018-09-04 (×17): 12.5 mg via ORAL
  Filled 2018-08-25 (×20): qty 1

## 2018-08-25 MED ORDER — AMIODARONE LOAD VIA INFUSION
150.0000 mg | Freq: Once | INTRAVENOUS | Status: AC
  Administered 2018-08-25: 150 mg via INTRAVENOUS
  Filled 2018-08-25: qty 83.34

## 2018-08-25 MED ORDER — LORAZEPAM 1 MG PO TABS
1.0000 mg | ORAL_TABLET | ORAL | Status: DC | PRN
  Administered 2018-08-27 – 2018-09-04 (×17): 1 mg via ORAL
  Filled 2018-08-25 (×18): qty 1

## 2018-08-25 MED ORDER — PREDNISOLONE ACETATE 1 % OP SUSP
1.0000 [drp] | Freq: Four times a day (QID) | OPHTHALMIC | Status: DC
  Administered 2018-08-25 – 2018-09-04 (×41): 1 [drp] via OPHTHALMIC
  Filled 2018-08-25: qty 1

## 2018-08-25 MED ORDER — FUROSEMIDE 40 MG PO TABS
80.0000 mg | ORAL_TABLET | Freq: Two times a day (BID) | ORAL | Status: DC
  Administered 2018-08-25: 80 mg via ORAL
  Filled 2018-08-25: qty 2

## 2018-08-25 MED ORDER — AMIODARONE HCL 200 MG PO TABS
400.0000 mg | ORAL_TABLET | Freq: Two times a day (BID) | ORAL | Status: DC
  Administered 2018-08-25 – 2018-09-04 (×20): 400 mg via ORAL
  Filled 2018-08-25 (×20): qty 2

## 2018-08-25 MED ORDER — HEPARIN BOLUS VIA INFUSION
950.0000 [IU] | Freq: Once | INTRAVENOUS | Status: AC
  Administered 2018-08-25: 19:00:00 950 [IU] via INTRAVENOUS
  Filled 2018-08-25: qty 950

## 2018-08-25 MED ORDER — METOPROLOL TARTRATE 5 MG/5ML IV SOLN
2.5000 mg | INTRAVENOUS | Status: AC
  Administered 2018-08-25: 2.5 mg via INTRAVENOUS

## 2018-08-25 NOTE — TOC Initial Note (Signed)
Transition of Care [facility]) - Initial/Assessment Note    Patient Details  Name: Justin Andrews MRN: [id number] Date of Birth: Jul 08, 1934  Transition of Care [facility] CM/SW Contact:    Shelbie Hutching, RN Phone Number: 08/25/2018, 4:00 PM  Clinical Narrative:                 Patient admitted with shortness of breath, COPD exacerbation.  Patient is from a family care home, [facility] in [place].  Patient has a protective order with DSS and they make all decisions regarding care.  Bridgette Habermann is the Social Worker her number is [phone].  Patient will return to the family care home at discharge.    Expected Discharge Plan: Group Home Barriers to Discharge: Continued Medical Work up   Patient Goals and CMS Choice        Expected Discharge Plan and Services Expected Discharge Plan: Group Home       Living arrangements for the past 2 months: Group Home                                      Prior Living Arrangements/Services Living arrangements for the past 2 months: Group Home Lives with:: Facility Resident Patient language and need for interpreter reviewed:: No        Need for Family Participation in Patient Care: Yes (Comment) Care giver support system in place?: Yes (comment)(DSS)   Criminal Activity/Legal Involvement Pertinent to Current Situation/Hospitalization: No - Comment as needed  Activities of Daily Living Home Assistive Devices/Equipment: Walker (specify type) ADL Screening (condition at time of admission) Patient's cognitive ability adequate to safely complete daily activities?: Yes Is the patient deaf or have difficulty hearing?: No Does the patient have difficulty seeing, even when wearing glasses/contacts?: No Does the patient have difficulty concentrating, remembering, or making decisions?: No Patient able to express need for assistance with ADLs?: Yes Does the patient have difficulty dressing or bathing?: No Independently performs  ADLs?: Yes (appropriate for developmental age) Does the patient have difficulty walking or climbing stairs?: Yes Weakness of Legs: Both Weakness of Arms/Hands: Both  Permission Sought/Granted Permission sought to share information with : Case Manager, Chartered certified accountant granted to share information with : Yes, Verbal Permission Granted     Permission granted to share info w AGENCY: [facility]        Emotional Assessment Appearance:: Appears stated age     Orientation: : Oriented to Self, Oriented to Place, Oriented to  Time, Oriented to Situation Alcohol / Substance Use: Not Applicable Psych Involvement: No (comment)  Admission diagnosis:  Shortness of breath [R06.02] Acute pulmonary edema ([place]) [J81.0] Community acquired pneumonia of right lower lobe of lung ([place]) [J18.1] Patient Active Problem List   Diagnosis Date Noted  . Acute pulmonary edema (HCC)   . Community acquired pneumonia of right lower lobe of lung ([place])   . Acute respiratory failure with hypoxia ([place]) 08/23/2018  . Encounter for competency evaluation 07/28/2018  . History of Clostridioides difficile infection 07/17/2018  . Insufficient social support 06/27/2018  . Abdominal pain 05/28/2018  . GI bleed 05/02/2018  . Hemorrhagic shock ([place]) 05/02/2018  . Altered mental status 05/02/2018  . Anemia due to blood loss, acute 05/02/2018  . RLS (restless legs syndrome) 04/22/2018  . Wrist pain 04/22/2018  . Chronic hip pain 04/08/2018  . Suicidal ideation 03/19/2018  .  Protein-calorie malnutrition, severe 12/03/2017  . Closed left hip fracture ([place]) 11/30/2017  . Diarrhea 11/15/2017  . Duodenum disorder 11/15/2017  . Frequency of urination 10/21/2017  . Cigarette smoker 10/06/2017  . Cough 09/25/2017  . Changes in vision 09/25/2017  . Proteinuria 09/25/2017  . Basal cell carcinoma 02/28/2017  . 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 DOE (dyspnea on exertion)  01/17/2017  . Chronic right upper quadrant pain 11/15/2016  . Balance problem 11/15/2016  . Simple renal cyst 10/21/2016  . Prostate nodule 10/16/2016  . Dysuria 10/16/2016  . CKD (chronic kidney disease) stage 3, GFR 30-59 ml/min (HCC) 08/05/2016  . Anemia 08/05/2016  . Anxiety and depression 08/05/2016  . Weight loss 08/05/2016  . Sleep disturbance 07/23/2016  . Hypertension   . Osteoarthritis   . Tobacco abuse   . 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[place]) 03/06/2016   PCP:  Leone Haven, MD Pharmacy:   [id number] [address], [place] [address] Phone: [phone] Fax: [phone]     Social Determinants of Health (SDOH) Interventions    Readmission Risk Interventions Readmission Risk Prevention Plan 05/07/2018  Transportation Screening Complete  PCP or Specialist Appt within 3-5 Days Complete  HRI or Home Care Consult Complete  Medication Review (RN Care Manager) Complete  Some recent data might be hidden

## 2018-08-25 NOTE — Progress Notes (Addendum)
Name: Justin Andrews MRN: [id number] DOB: 11-11-1934     CONSULTATION DATE: 08/23/2018  CHIEF COMPLAINT:  Shortness of breath.  HISTORY OF PRESENT ILLNESS: Briefly, this is a 84 year old male with history of Alcohol abuse, C.diff, CKD (stg III), depression, GERD, hx of GI bleed - 04/2018, melanoma in citu, HTN, OA, PAF - 04/2018, and tobacco use seen here in the ICU for further mangement of acute hypoxic respiratory distress in the setting of CHF and COPD exacerbation. Patient notes that he has been experiencing worsening SOB x 3 weeks w/o cough. EMS was called, and the patient was transported from his living facility to the ED.  En route he was hypoxic, with O2 saturation in the 80's. In the ED, he started on 4 L O2, and was transitioned to BiPAP after continued respiratory distress. Initial labs indicated acute on chronic kidney injury, with Cr 2.68 and GFR at 21, BNP 1820. Initial CXR was concerning for new diffuse pulmonary edema, with small right pleural effusion, and patchy airspace disease at right lung base w/ concern for pneumonia vs. aspiration. Patient was admitted to the ICU on BIPAP, antibiotics and Lasix for further management. He is being followed by Cardiology for assistance in managing Afib.   Today he is drowsy, and awakes to verbal stimuli. He is on O2 via [place], with SpO2 > 95. His shortness of breath is unchanged, but his cough is improved. He notes a headache, and light headedness. Patient denies chest pain or tightness, abdominal pain, N/V, sensory changes, hallucinations. He denies dysphagia, thus metoprolol and amiodarone were transitioned to PO.  OVERNIGHT EVENTS: - None.  PAST MEDICAL HISTORY :   has a past medical history of Alcohol abuse, C. difficile diarrhea (11/2017), Chronic kidney disease (CKD), stage III (moderate) ([place]), Depression, GERD (gastroesophageal reflux disease), GI bleed, History of melanoma, Hypertension, Osteoarthritis, PAF (paroxysmal atrial fibrillation)  ([place]), and Tobacco abuse.  has a past surgical history that includes External ear surgery; Tonsillectomy; Rotator cuff repair; Ankle fracture surgery; Tympanoplasty w/ mastoidectomy; Intramedullary (im) nail intertrochanteric (Left, 12/01/2017); Esophagogastroduodenoscopy (egd) with propofol (N/A, 05/03/2018); VISCERAL ARTERY INTERVENTION (N/A, 05/04/2018); laparotomy (N/A, 05/08/2018); Central venous catheter insertion (05/08/2018); Esophagogastroduodenoscopy (05/08/2018); Jejunostomy (05/08/2018); and Cataract extraction w/PHACO (Right, 08/13/2018). Prior to Admission medications   Medication Sig Start Date End Date Taking? Authorizing Provider  calcium-vitamin D (OSCAL WITH D) 500-200 MG-UNIT tablet Take 1 tablet by mouth 2 (two) times daily. 07/16/18  Yes Leone Haven, MD  doxycycline (VIBRA-TABS) 100 MG tablet Take 1 tablet (100 mg total) by mouth 2 (two) times daily. 08/04/18  Yes Guse, Jacquelynn Cree, FNP  DUREZOL 0.05 % EMUL Place 1 drop into the right eye daily. 08/21/18  Yes [provider]  FEROSUL 325 (65 Fe) MG tablet TAKE 1 TABLET BY MOUTH ONCE DAILY FOR SUPPLEMENT Patient taking differently: Take 325 mg by mouth daily.  07/14/18  Yes Leone Haven, MD  folic acid (FOLVITE) 1 MG tablet TAKE 1 TABLET BY MOUTH DAILY Patient taking differently: Take 1 mg by mouth daily.  07/14/18  Yes Leone Haven, MD  mirtazapine (REMERON) 30 MG tablet Take 0.5 tablets (15 mg total) by mouth at bedtime. 06/17/18  Yes Wieting, Richard, MD  pantoprazole (PROTONIX) 40 MG tablet TAKE 1 TABLET DAILY AT 7:00 AM ON AN EMPTY STOMACH Patient taking differently: Take 40 mg by mouth daily.  07/14/18  Yes Leone Haven, MD  sulfamethoxazole-trimethoprim (BACTRIM DS) 800-160 MG tablet Take 1 tablet by mouth  2 (two) times daily. 08/18/18  Yes [provider]  tamsulosin (FLOMAX) 0.4 MG CAPS capsule TAKE 1 CAPSULE BY MOUTH DAILY Patient taking differently: Take 0.4 mg by mouth daily.  07/14/18  Yes Leone Haven, MD  thiamine 100 MG tablet Take 1 tablet (100 mg total) by mouth daily. 07/16/18  Yes Leone Haven, MD  traZODone (DESYREL) 100 MG tablet Take 1 tablet (100 mg total) by mouth at bedtime. 07/27/18  Yes Leone Haven, MD  ALPRAZolam Duanne Moron) 0.25 MG tablet Take 1 tablet (0.25 mg total) by mouth 2 (two) times daily as needed for anxiety. Patient not taking: Reported on 08/07/2018 07/14/18   Leone Haven, MD   Allergies  Allergen Reactions   Aspirin Other (See Comments)    bleeding   REVIEW OF SYSTEMS: Review of systems hindered by patient's somnolence, and refusal for further care. Gen: + Headache. Cardiac:  + lightheadedness. Denies chest pain or heaviness, chest tightness Resp:  + shortness of breath. No cough, or wheezing. Gi: Denies swallowing difficulty, stomach pain, nausea or vomiting Gu:  + bladder fullness Ext:   + joint sorenss (left ankle and knee). Psych:   Denies hallucinations.  Other:  All other systems negative   VITAL SIGNS: Temp:  [97.5 F (36.4 C)-98.4 F (36.9 C)] 97.5 F (36.4 C) (08/18 0800) Pulse Rate:  [71-122] 71 (08/18 0900) Resp:  [14-29] 14 (08/18 0900) BP: (76-124)/(42-78) 103/60 (08/18 0800) SpO2:  [87 %-100 %] 95 % (08/18 0900)  I/O last 3 completed shifts: In: 1130.4 [P.O.:720; I.V.:160.4; IV Piggyback:250] Out: 2650 [Urine:2650] Total I/O In: 296.1 [P.O.:240; I.V.:56.1] Out: -   SpO2: 95 % O2 Flow Rate (L/min): 2 L/min FiO2 (%): 30 %  Physical Examination: This PA-S tried multiple times to assess this patient fully. Patient refusing to allow for aspects of examination.  GENERAL: Thin, frail male, in no acute distress. On O2 via [place] @ 2L.  HEAD: Normocephalic, atraumatic.  EYES: Patient refused to open his eyes to assess for pupillary reaction. No scleral icterus.  MOUTH: Moist mucosal membrane. NECK: Supple. No JVD.  PULMONARY: Poor inspiratory effort. Evidence of accessory muscle use. + Rales at bases. CARDIOVASCULAR:  S1 and S2. Regular rate and rhythm. No murmurs, rubs, or gallops.  GASTROINTESTINAL: Soft. Tenderness to deep palpation in LLQ. Otherwise non-tender. Non-distended. No masses. Positive bowel sounds. No hepatosplenomegaly.  MUSCULOSKELETAL: No edema. Bilateral venous stasis changes, w/o ulceration/lesions. 2+ DP and radial pulses bilaterally.  NEUROLOGIC: Alert and oriented x 3. Follows commands. Responsive to verbal stimuli, but falls asleep during examination. Able to move limbs. Occasional involuntary jerking movements of the legs. SKIN: intact, warm, dry. Left knee abrasion, ~ 3 cm, healing, s/p fall prior to admission. I personally reviewed lab work that was obtained in last 24 hrs.  MEDICATIONS: I have reviewed all medications and confirmed regimen as documented  CULTURE RESULTS   Recent Results (from the past 240 hour(s))  SARS Coronavirus 2 [facility] order, Performed in [facility] hospital lab) Nasopharyngeal Nasopharyngeal Swab     Status: None   Collection Time: 08/23/18  5:03 PM   Specimen: Nasopharyngeal Swab  Result Value Ref Range Status   SARS Coronavirus 2 NEGATIVE NEGATIVE Final    Comment: (NOTE) If result is NEGATIVE SARS-CoV-2 target nucleic acids are NOT DETECTED. The SARS-CoV-2 RNA is generally detectable in upper and lower  respiratory specimens during the acute phase of infection. The lowest  concentration of SARS-CoV-2 viral copies this assay can  detect is 250  copies / mL. A negative result does not preclude SARS-CoV-2 infection  and should not be used as the sole basis for treatment or other  patient management decisions.  A negative result may occur with  improper specimen collection / handling, submission of specimen other  than nasopharyngeal swab, presence of viral mutation(s) within the  areas targeted by this assay, and inadequate number of viral copies  (<250 copies / mL). A negative result must be combined with clinical  observations, patient history,  and epidemiological information. If result is POSITIVE SARS-CoV-2 target nucleic acids are DETECTED. The SARS-CoV-2 RNA is generally detectable in upper and lower  respiratory specimens dur ing the acute phase of infection.  Positive  results are indicative of active infection with SARS-CoV-2.  Clinical  correlation with patient history and other diagnostic information is  necessary to determine patient infection status.  Positive results do  not rule out bacterial infection or co-infection with other viruses. If result is PRESUMPTIVE POSTIVE SARS-CoV-2 nucleic acids MAY BE PRESENT.   A presumptive positive result was obtained on the submitted specimen  and confirmed on repeat testing.  While 2019 novel coronavirus  (SARS-CoV-2) nucleic acids may be present in the submitted sample  additional confirmatory testing may be necessary for epidemiological  and / or clinical management purposes  to differentiate between  SARS-CoV-2 and other Sarbecovirus currently known to infect humans.  If clinically indicated additional testing with an alternate test  methodology [phone]) is advised. The SARS-CoV-2 RNA is generally  detectable in upper and lower respiratory sp ecimens during the acute  phase of infection. The expected result is Negative. Fact Sheet for Patients:  StrictlyIdeas.no Fact Sheet for Healthcare Providers: BankingDealers.co.za This test is not yet approved or cleared by the [place] FDA and has been authorized for detection and/or diagnosis of SARS-CoV-2 by FDA under an Emergency Use Authorization (EUA).  This EUA will remain in effect (meaning this test can be used) for the duration of the COVID-19 declaration under Section 564(b)(1) of the Act, 21 U.S.C. section 360bbb-3(b)(1), unless the authorization is terminated or revoked sooner. Performed at [facility], [address] [place], [place] [address]   Blood  culture (routine x 2)     Status: None (Preliminary result)   Collection Time: 08/23/18  5:03 PM   Specimen: BLOOD  Result Value Ref Range Status   Specimen Description BLOOD RAC  Final   Special Requests   Final    BOTTLES DRAWN AEROBIC AND ANAEROBIC Blood Culture adequate volume   Culture   Final    NO GROWTH 2 DAYS Performed at [facility], [address], [place], [place] [address]    Report Status PENDING  Incomplete  Blood culture (routine x 2)     Status: None (Preliminary result)   Collection Time: 08/23/18  5:03 PM   Specimen: BLOOD  Result Value Ref Range Status   Specimen Description BLOOD R F ARM  Final   Special Requests   Final    BOTTLES DRAWN AEROBIC AND ANAEROBIC Blood Culture adequate volume   Culture   Final    NO GROWTH 2 DAYS Performed at [facility], [address] [place], [place] [address]    Report Status PENDING  Incomplete  MRSA PCR Screening     Status: None   Collection Time: 08/23/18  9:32 PM   Specimen: Nasopharyngeal  Result Value Ref Range Status   MRSA by PCR NEGATIVE NEGATIVE  Final    Comment:        The GeneXpert MRSA Assay (FDA approved for NASAL specimens only), is one component of a comprehensive MRSA colonization surveillance program. It is not intended to diagnose MRSA infection nor to guide or monitor treatment for MRSA infections. Performed at [facility], [address] [place], [place] [address]    IMAGING   CXR reviewed by  -    Dg Chest Port 1 View  Result Date: 08/25/2018 CLINICAL DATA:  Respiratory failure EXAM: PORTABLE CHEST 1 VIEW COMPARISON:  August 23, 2018 FINDINGS: There is cardiomegaly with pulmonary venous hypertension. There is interstitial edema with pleural effusions bilaterally. There is patchy airspace opacity in the lung bases. There is aortic atherosclerosis. No adenopathy. There is evidence of old healed rib fractures bilaterally. There are surgical clips over the left  apex region, stable. IMPRESSION: Pulmonary vascular congestion with interstitial edema and pleural effusions bilaterally. Airspace opacity in the bases is likely due to alveolar edema, although superimposed pneumonia cannot be excluded. The overall appearance is most consistent with congestive heart failure. Aortic Atherosclerosis (ICD10-I70.0). Electronically Signed   By: Lowella Grip III M.D.   On: 08/25/2018 08:22   External Urinary Catheter continued, requirement due to   Reason to continue External  Urinary Catheter strict Intake/Output monitoring for hemodynamic instability    ASSESSMENT AND PLAN SYNOPSIS: 84 year old male with history as stated above, seen here for further management of acute hypoxic respiratory failure, in the setting of COPD and CHF exacerbation. Patient's kidney function has continued to decline as well, indicating an acute on chronic injury. At this time, the patient would be a poor dialysis candidate, given his multiple comorbid conditions, and frailty. Palliative care has been consulted.  Severe ACUTE Hypoxic and Hypercapnic Respiratory Failure  - Continue Duoneb. - Continue O2 via [place]. BiPAP if fail [place].  ACUTE DECOMPENSATED CARDIAC FAILURE - Cardiology following.  - BNP trending down. Recent CXR with pulm edema and pleural effusion.  - ECHO pending.  - Lasix as tolerated  Atrial fibrillation with RVR - now in NSR - Cardiology following as well. Will defer to cardiology team for management of anticoagulation, in the setting of worsening kidney function, and hx of GI bleed. - Continue IV Heparin. - Will continue w/ amiodarone, and transition IV to PO. - Metoprolol 12.5 BID.  - ICU monitoring  ACUTE KIDNEY INJURY/Renal Failure - worsening - Urine output adequate on Lasix. Will continue to follow UO - follow chem 7 and electrolytes, and replace PRN.  - continue external catheter - assess need - Avoid nephrotoxic agents  Alcohol abuse - Patient is  non-agitated. - CIWA protocol in place,  Ativan PRN.   GI GI PROPHYLAXIS as indicated  NUTRITIONAL STATUS DIET--> Heart healthy. Constipation protocol as indicated  ENDO - will use ICU hypoglycemic\Hyperglycemia protocol if needed  DVT/GI PRX ordered - Heparin, Protonix. TRANSFUSIONS AS NEEDED MONITOR FSBS ASSESS the need for LABS   Dareen Piano, PA student  PCCM ATTENDING ATTESTATION: I have evaluated patient with the PA student, reviewed database in its entirety and discussed care plan in detail. In addition, this patient was discussed on multidisciplinary rounds. I have personally reviewed all chest radiographs discussed herein including CXRs and CT chest unless otherwise indicated  No major events in the past 24 hours.  Complains of LE pain (chronic)  Important exam findings: He remains comfortable on [place] O2. Cachectic Well oriented HEENT WNL No JVD noted Bibasilar crackles, no  wheezes Regular, no M NABS, soft Extremities warm, no LE edema  CXR: Persistent mild edema pattern   Major problems addressed by PCCM team: Acute (on chronic) hypoxemic respiratory failure Acute pulmonary edema Doubt pneumonia Heavy smoker without prior diagnosis of COPD             No wheezing noted on exam this morning Documented history of alcohol abuse Documented history of melanoma (Care Everywhere)  PLAN/REC: Awaiting transfer to [place] floor with cardiac monitoring Continue nebulized bronchodilators Further cardiac evaluation and management per cardiology Palliative care consultation has been requested From PCCM perspective, he is a very poor candidate for intubation or ACLS    Merton Border, MD PCCM service Mobile [phone] Pager [phone] 08/25/2018 1:49 PM

## 2018-08-25 NOTE — Progress Notes (Signed)
Pt transferred to room 111, report called to [employer].  VSS on room air, chart belongings and meds transferred with him

## 2018-08-25 NOTE — Progress Notes (Signed)
PALLIATIVE NOTE:  Referral received for [place] discussion. Patient is a ward of the state with DSS documents on file as legal guardian. I have left voicemail message with Bridgette Habermann (DSS worker) and my contact information left for return call.   Detailed note and recommendations to follow discussions with legal guardian.   Thank you for your referral and allowing PMT to be involved in patient's care.   Alda Lea, AGPCNP-BC Palliative Medicine Team   NO CHARGE

## 2018-08-25 NOTE — Progress Notes (Signed)
[place] for Heparin  Indication: atrial fibrillation  Allergies  Allergen Reactions  . Aspirin Other (See Comments)    bleeding    Patient Measurements: Height: 5\' 7"  (170.2 cm) Weight: 120 lb 9.5 oz (54.7 kg) IBW/kg (Calculated) : 66.1 Heparin Dosing Weight: 66.1   Vital Signs: Temp: 98.4 F (36.9 C) (08/18 1408) Temp Source: Oral (08/18 1408) BP: 104/57 (08/18 1408) Pulse Rate: 75 (08/18 1408)  Labs: Recent Labs    08/23/18 1703 08/23/18 2212 08/24/18 0333  08/24/18 1823 08/25/18 0750 08/25/18 1708  HGB 11.3*  --  9.8*  --   --  9.9*  --   HCT 35.2*  --  30.8*  --   --  30.7*  --   PLT 249  --  228  --   --  229  --   APTT 29  --   --    < > 44* 50* 61*  LABPROT 13.4  --   --   --   --   --   --   INR 1.0  --   --   --   --   --   --   CREATININE 2.68*  --  2.76*  --   --  3.32*  --   TROPONINIHS 69* 59*  --   --   --   --   --    < > = values in this interval not displayed.    Estimated Creatinine Clearance: 13.3 mL/min (A) (by C-G formula based on SCr of 3.32 mg/dL (H)).   Medical History: Past Medical History:  Diagnosis Date  . Alcohol abuse   . C. difficile diarrhea 11/2017  . Chronic kidney disease (CKD), stage III (moderate) (HCC)   . Depression   . GERD (gastroesophageal reflux disease)   . GI bleed    a. bleeding duodenal ulcers in 04/2018 s/p cauterization and duodenalplasty  . History of melanoma   . Hypertension   . Osteoarthritis   . PAF (paroxysmal atrial fibrillation) ([place])    a. initially noted on 05/06/2018  . Tobacco abuse     Medications:  Medications Prior to Admission  Medication Sig Dispense Refill Last Dose  . calcium-vitamin D (OSCAL WITH D) 500-200 MG-UNIT tablet Take 1 tablet by mouth 2 (two) times daily. 60 tablet 1 unknown at Unknown time  . doxycycline (VIBRA-TABS) 100 MG tablet Take 1 tablet (100 mg total) by mouth 2 (two) times daily. 20 tablet 0 unknown at unknown  . DUREZOL  0.05 % EMUL Place 1 drop into the right eye daily.   unknkown at unknown  . FEROSUL 325 (65 Fe) MG tablet TAKE 1 TABLET BY MOUTH ONCE DAILY FOR SUPPLEMENT (Patient taking differently: Take 325 mg by mouth daily. ) 30 tablet 0 unknown at unknown  . folic acid (FOLVITE) 1 MG tablet TAKE 1 TABLET BY MOUTH DAILY (Patient taking differently: Take 1 mg by mouth daily. ) 30 tablet 0 unknown at unknown  . mirtazapine (REMERON) 30 MG tablet Take 0.5 tablets (15 mg total) by mouth at bedtime. 30 tablet 0 unknown at unknown  . pantoprazole (PROTONIX) 40 MG tablet TAKE 1 TABLET DAILY AT 7:00 AM ON AN EMPTY STOMACH (Patient taking differently: Take 40 mg by mouth daily. ) 30 tablet 0 unknown at unknown  . sulfamethoxazole-trimethoprim (BACTRIM DS) 800-160 MG tablet Take 1 tablet by mouth 2 (two) times daily.   unknown at unknown  . tamsulosin (FLOMAX) 0.4  MG CAPS capsule TAKE 1 CAPSULE BY MOUTH DAILY (Patient taking differently: Take 0.4 mg by mouth daily. ) 30 capsule 0 unknown at unknown  . thiamine 100 MG tablet Take 1 tablet (100 mg total) by mouth daily. 30 tablet 1 unknown at unknown  . traZODone (DESYREL) 100 MG tablet Take 1 tablet (100 mg total) by mouth at bedtime. 30 tablet 3 unknown at unknown  . ALPRAZolam (XANAX) 0.25 MG tablet Take 1 tablet (0.25 mg total) by mouth 2 (two) times daily as needed for anxiety. (Patient not taking: Reported on 08/07/2018) 12 tablet 0     Assessment:Pharmacy consulted for heparin drip management for [name] admitted to ICU with acute hypoxic respiratory distress secondary to CHF and COPD exacerbation. Patient with atrial fibrillation with RVR on admission and was previously on apixaban 2.5 mg BID. Heparin 5000 units [place] given 8/17 AM. Current heparin drip at 850 units/hr. APTT = 50 sec.   8/18 aPTT @1708 : 61 subtherapeutic- confirmed with nurse no interruptions.   Goal of Therapy:  Heparin level 0.3-0.7 units/mL when aPTT correlate. Goal aPTT: 66-102  seconds Monitor platelets by anticoagulation protocol: Yes   Plan:  Initiate heparin bolus of 950 units. Then increase rate to 1050 units/hr. Will recheck aPTT at 0000 . Will check initial anti-Xa level with 8/19 AM labs.  Pharmacy will continue to monitor and adjust per consult.  Rowland Lathe 08/25/2018,5:38 PM

## 2018-08-25 NOTE — Progress Notes (Signed)
Progress Note  Patient Name: Justin Andrews Date of Encounter: 08/25/2018  Primary Cardiologist: [facility], Dr. Fletcher Anon  Subjective   Patient reports continues to feel shortness of breath.  Stated "I cannot really talk with you, because I feel so short of breath."  He denies any current chest pain, palpitations, or feeling of racing heart rate.  He stated that he no longer feels as if he is going to pass out.    He reports that he has been constipated for over a week.  Inpatient Medications    Scheduled Meds:  amiodarone  400 mg Oral BID   calcium-vitamin D  1 tablet Oral BID   chlorhexidine  15 mL Mouth Rinse BID   Chlorhexidine Gluconate Cloth  6 each Topical Daily   ferrous sulfate  325 mg Oral Daily   furosemide  80 mg Oral BID   ipratropium-albuterol  3 mL Nebulization Q6H   mouth rinse  15 mL Mouth Rinse q12n4p   metoprolol tartrate  12.5 mg Oral BID   mirtazapine  15 mg Oral QHS   pantoprazole  40 mg Oral Daily   polyethylene glycol  17 g Oral Daily   prednisoLONE acetate  1 drop Right Eye QID   tamsulosin  0.4 mg Oral Daily   traZODone  100 mg Oral QHS   Continuous Infusions:  heparin 950 Units/hr (08/25/18 1438)   PRN Meds: acetaminophen **OR** acetaminophen, LORazepam, nicotine, [DISCONTINUED] ondansetron **OR** ondansetron (ZOFRAN) IV, traMADol   Vital Signs    Vitals:   08/25/18 1100 08/25/18 1200 08/25/18 1408 08/25/18 1428  BP: (!) 102/54 119/64 (!) 104/57   Pulse: 74 76 75   Resp: 19 (!) 24 18   Temp:  (!) 97.5 F (36.4 C) 98.4 F (36.9 C)   TempSrc:  Axillary Oral   SpO2: 98% 99% 97% 96%  Weight:      Height:        Intake/Output Summary (Last 24 hours) at 08/25/2018 1535 Last data filed at 08/25/2018 1300 Gross per 24 hour  Intake 713.47 ml  Output 850 ml  Net -136.53 ml   Last 3 Weights 08/23/2018 08/23/2018 08/17/2018  Weight (lbs) 120 lb 9.5 oz 120 lb 125 lb  Weight (kg) 54.7 kg 54.432 kg 56.7 kg      Telemetry    NSR  with ectopy (PVCs, PACs) and earlier run of 7 beat NSVT at 4:27AM this AM. Previously ectopic atrial rhythm and runs of atrial tachycardia / atrial fibrillation with spontaneous conversion 8/16 at 21:45 - Personally Reviewed  ECG    No new tracings - Personally Reviewed  Physical Exam   GEN: No acute distress. Elderly and frail male.   Neck: No JVD Cardiac: RRR, no murmurs, rubs, or gallops.  Respiratory:  Diminished breath sounds bilaterally, bibasilar crackles, poor inspiratory effort GI: Soft, nontender, non-distended  MS: No edema; No deformity. Neuro:  Nonfocal  Psych: Normal affect   Labs    High Sensitivity Troponin:   Recent Labs  Lab 08/23/18 1703 08/23/18 2212  TROPONINIHS 69* 59*      Cardiac EnzymesNo results for input(s): TROPONINI in the last 168 hours. No results for input(s): TROPIPOC in the last 168 hours.   Chemistry Recent Labs  Lab 08/23/18 1703 08/24/18 0333 08/25/18 0750  NA 137 139 135  K 4.1 4.1 4.1  CL 100 97* 100  CO2 23 24 25   GLUCOSE 126* 134* 124*  BUN 35* 41* 63*  CREATININE 2.68* 2.76*  3.32*  CALCIUM 9.4 8.7* 8.6*  PROT 7.2  --   --   ALBUMIN 3.8  --   --   AST 37  --   --   ALT 34  --   --   ALKPHOS 116  --   --   BILITOT 0.8  --   --   GFRNONAA 21* 20* 16*  GFRAA 25* 24* 19*  ANIONGAP 14 18* 10     Hematology Recent Labs  Lab 08/23/18 1703 08/24/18 0333 08/25/18 0750  WBC 7.0 4.0 9.1  RBC 3.70* 3.25* 3.25*  HGB 11.3* 9.8* 9.9*  HCT 35.2* 30.8* 30.7*  MCV 95.1 94.8 94.5  MCH 30.5 30.2 30.5  MCHC 32.1 31.8 32.2  RDW 17.5* 17.4* 17.4*  PLT 249 228 229    BNP Recent Labs  Lab 08/23/18 1703 08/25/18 0750  BNP 1,820.0* 963.0*     DDimer No results for input(s): DDIMER in the last 168 hours.   Radiology    Dg Abd 1 View  Result Date: 08/23/2018 CLINICAL DATA:  Abdominal pain EXAM: ABDOMEN - 1 VIEW COMPARISON:  CT May 28, 2018, radiograph May 09, 2018 FINDINGS: Coil pack projects over the upper abdomen.  Numerous nonspecific air-filled loops of small bowel are present, clustered in the mid abdomen. Air and stool overlies the rectal vault. No obstructive bowel gas pattern is identified. Numerous vascular calcifications project over the abdomen with scattered phleboliths present in the pelvis as well. Prior left femoral intramedullary nail and transcervical interference screw placement with extensive hypertrophic osseous changes of the proximal left femur. Findings are similar to prior. Additional degenerative changes in the lower spine and pelvic girdle. Multifocal patchy airspace opacities are present throughout the lungs with the new right pleural effusion. IMPRESSION: Multifocal patchy airspace opacities throughout the lungs with the new right pleural effusion, concerning for multifocal pneumonia. Cluster of air-filled but nondistended loops of small bowel in the mid abdomen, nonspecific but could reflect developing ileus in the appropriate clinical setting. Electronically Signed   By: Lovena Le M.D.   On: 08/23/2018 21:57   Dg Chest Port 1 View  Result Date: 08/25/2018 CLINICAL DATA:  Respiratory failure EXAM: PORTABLE CHEST 1 VIEW COMPARISON:  August 23, 2018 FINDINGS: There is cardiomegaly with pulmonary venous hypertension. There is interstitial edema with pleural effusions bilaterally. There is patchy airspace opacity in the lung bases. There is aortic atherosclerosis. No adenopathy. There is evidence of old healed rib fractures bilaterally. There are surgical clips over the left apex region, stable. IMPRESSION: Pulmonary vascular congestion with interstitial edema and pleural effusions bilaterally. Airspace opacity in the bases is likely due to alveolar edema, although superimposed pneumonia cannot be excluded. The overall appearance is most consistent with congestive heart failure. Aortic Atherosclerosis (ICD10-I70.0). Electronically Signed   By: Lowella Grip III M.D.   On: 08/25/2018 08:22    Dg Chest Portable 1 View  Result Date: 08/23/2018 CLINICAL DATA:  Shortness of breath. EXAM: PORTABLE CHEST 1 VIEW COMPARISON:  Multiple prior chest x-rays, most recently dated July 31, 2018. FINDINGS: The heart size and mediastinal contours are within normal limits. Atherosclerotic calcification of the aortic arch. New pulmonary vascular congestion and diffuse interstitial thickening. The lungs remain hyperinflated with emphysematous changes. New patchy opacities at the right lung base. New small right pleural effusion. No pneumothorax. No acute osseous abnormality. Old bilateral rib fractures. IMPRESSION: 1. New diffuse interstitial pulmonary edema and small right pleural effusion. 2. New patchy airspace disease at the  right lung base suspicious for pneumonia/aspiration. 3. COPD. Electronically Signed   By: Titus Dubin M.D.   On: 08/23/2018 18:00    Cardiac Studies   Pending echo  Patient Profile     84 y.o. male with a history of paroxysmal atrial fibrillation initially noted on EKG 05/06/2018, recent GI bleed 04/2018, CKD stage III, anemia of chronic disease complicated by acute blood loss anemia, C. difficile colitis, hypertension, alcohol and tobacco abuse, melanoma, and recent fall x2 with 3 total falls in the past 12 months, and who is being followed this admission for paroxysmal atrial fibrillation with RVR.  Assessment & Plan    Paroxysmal atrial fibrillation with RVR - Continues to feel shortness of breath and racing heart rate, despite maintaining NSR with frequent ectopy and rates in the 70s at the time of cardiac consultation. Earlier run of NSVT.  --Dx 05/06/2018 per 12-lead EKG.  He was not placed on [place] at that time, likely in the setting of GIB requiring transfusion, cauterization, exploratory lab and duodenalplasty.  --EKG at admission thought likely ectopic atrial rhythm with PACs over atrial fibrillation. --S/p IV amiodarone, received earlier today then started on oral  amiodarone earlier today. Also on PRN IV lopressor and oral lopressor 12.5mg  BID.  Currently rate controlled while in SR with ectopy. --Continue heparin for now and as Hgb allows as below. Not on [facility] with recommendation to consider risks versus benefits of restart given recent GIB and history of recent falls in the past 12 months (3 falls) and known alcohol use. If restart [place], recommend reduced dose Eliquis 2.5mg  BID as meets weight, age, and SCr (over 35 yo, weight under 60kg, SCr over 1.5) requirements for reduced dose. CHA2DS2VASc score of at least  5 (CHF, HTN, agex2, vascular disease) with recommendation for anticoagulation due to risk of thrombosis and with careful consideration of risks and benefits.  Acute respiratory distress with hypoxia Acute on chronic congestive heart failure (EF unknown, pending echo) --Ongoing SOB. BNP 963 and improving with diuresis.  Suspect SOB likely multifactorial with acute on chronic heart failure and AECOPD. Repeat CXR today shows interstitial edema and b/l pleural effusions. Consider pna.  --Started on oral lasix 80mg  BID with Cr 2.76  3.32 with BUN 63 (estimated baseline Scr 1.8-2.1, known CKDIII). Recommend at least 1 more day of lasix with close monitoring of renal function. Of note, not previously on PTA lasix.  --Continue to monitor I/O, daily weights. Patient is -369.6 cc yesterday and -1082.6 cc of admission. Weight 54kg with no recent updates to weight. Continue trending BNP. --Not on BB due to AECOPD. Not on ACE/ARB/spiro due to renal function. Further recommendations regarding evidence based therapy pending assessment of EF and as vitals, renal function, and breathing status allow.  --Further recommendations pending echo. If EF significantly reduced, consider ischemic evaluation prior to discharge or as an outpatient.   Acute on chronic kidney disease, stage III --Baseline creatinine appears around 1.8-2.1. Close monitoring of renal function, daily  BMET.   Anemia of chronic disease, recent GIB --Recent GIB as above. Hgb 9.9.  Daily CBC. Transfuse below 8.0 and if drop in Hgb discontinue heparin. Anemia could be contributing to continued SOB. Will need to weigh the risks versus benefits of restarting oral anticoagulation at discharge given history of GI bleed and falls as above.  Also, patient has a recent history of alcohol abuse; however, he reports no recent alcohol use since his admission 04/2018.  Elevated troponin -No chest pain.  EKG and high-sensitivity troponin trend not consistent with ACS.  Pending echocardiogram.  If EF significantly reduced, consider ischemic evaluation in the future.  Hypertension -BP controlled to somewhat soft.  As above, no beta-blocker due to AECOPD. No ACE/ARB/Spiro due to renal function.  History of alcohol and tobacco abuse - Prior history of 1/5 of whiskey or vodka /day for 2 years. Cessation of tobacco advised, along with continued cessation of alcohol.  Weakness/falls --3 falls in last 12 months with 2 occurring in past month.    For questions or updates, please contact [place] Please consult [email or web address] for contact info under        Signed, Arvil Chaco, PA-C  08/25/2018, 3:35 PM

## 2018-08-25 NOTE — Progress Notes (Signed)
[facility] [place] NAME: Justin Andrews    MR#:  [id number]  DATE OF BIRTH:  07-02-1934  SUBJECTIVE: Patient is moved to ICU because of atrial fibrillation with RVR, started on amiodarone drip, this morning patient's heart rate is around 80 bpm, hemodynamically stable will transfer the patient out of ICU.  CHIEF COMPLAINT:   Chief Complaint  Patient presents with  . Weakness  . Shortness of Breath   Shortness of breath is improved but still present.  On room air today. Poor historian overall REVIEW OF SYSTEMS:    Review of Systems  Constitutional: Positive for malaise/fatigue. Negative for chills and fever.  HENT: Negative for sore throat.   Eyes: Negative for blurred vision, double vision and pain.  Respiratory: Positive for shortness of breath. Negative for cough, hemoptysis and wheezing.   Cardiovascular: Negative for chest pain, palpitations, orthopnea and leg swelling.  Gastrointestinal: Negative for abdominal pain, constipation, diarrhea, heartburn, nausea and vomiting.  Genitourinary: Negative for dysuria and hematuria.  Musculoskeletal: Negative for back pain and joint pain.  Skin: Negative for rash.  Neurological: Negative for sensory change, speech change, focal weakness and headaches.  Endo/Heme/Allergies: Does not bruise/bleed easily.  Psychiatric/Behavioral: Negative for depression. The patient is not nervous/anxious.     DRUG ALLERGIES:   Allergies  Allergen Reactions  . Aspirin Other (See Comments)    bleeding    VITALS:  Blood pressure (!) 104/57, pulse 75, temperature 98.4 F (36.9 C), temperature source Oral, resp. rate 18, height 5\' 7"  (1.702 m), weight 54.7 kg, SpO2 96 %.  PHYSICAL EXAMINATION:   Physical Exam  GENERAL:  84 y.o.-year-old patient lying in the bed with no acute distress.  EYES: Pupils equal, round, reactive to light and accommodation. No scleral icterus. Extraocular muscles intact.  HEENT:  Head atraumatic, normocephalic. Oropharynx and nasopharynx clear.  NECK:  Supple, no jugular venous distention. No thyroid enlargement, no tenderness.  LUNGS: Normal breath sounds bilaterally, no wheezing, rales, rhonchi. No use of accessory muscles of respiration.  CARDIOVASCULAR: S1, S2 iregular no murmurs, rubs, or gallops.  ABDOMEN: Soft, nontender, nondistended. Bowel sounds present. No organomegaly or mass.  EXTREMITIES: No cyanosis, clubbing or edema b/l.    NEUROLOGIC: Cranial nerves II through XII are intact. No focal Motor or sensory deficits b/l.   PSYCHIATRIC: The patient is alert and awake SKIN: No obvious rash, lesion, or ulcer.   LABORATORY PANEL:   CBC Recent Labs  Lab 08/25/18 0750  WBC 9.1  HGB 9.9*  HCT 30.7*  PLT 229   ------------------------------------------------------------------------------------------------------------------ Chemistries  Recent Labs  Lab 08/23/18 1703  08/24/18 0333 08/25/18 0750  NA 137  --  139 135  K 4.1  --  4.1 4.1  CL 100  --  97* 100  CO2 23  --  24 25  GLUCOSE 126*  --  134* 124*  BUN 35*  --  41* 63*  CREATININE 2.68*  --  2.76* 3.32*  CALCIUM 9.4  --  8.7* 8.6*  MG  --    < > 2.2  --   AST 37  --   --   --   ALT 34  --   --   --   ALKPHOS 116  --   --   --   BILITOT 0.8  --   --   --    < > = values in this interval not displayed.   ------------------------------------------------------------------------------------------------------------------  Cardiac Enzymes No  results for input(s): TROPONINI in the last 168 hours. ------------------------------------------------------------------------------------------------------------------  RADIOLOGY:  Dg Abd 1 View  Result Date: 08/23/2018 CLINICAL DATA:  Abdominal pain EXAM: ABDOMEN - 1 VIEW COMPARISON:  CT May 28, 2018, radiograph May 09, 2018 FINDINGS: Coil pack projects over the upper abdomen. Numerous nonspecific air-filled loops of small bowel are present,  clustered in the mid abdomen. Air and stool overlies the rectal vault. No obstructive bowel gas pattern is identified. Numerous vascular calcifications project over the abdomen with scattered phleboliths present in the pelvis as well. Prior left femoral intramedullary nail and transcervical interference screw placement with extensive hypertrophic osseous changes of the proximal left femur. Findings are similar to prior. Additional degenerative changes in the lower spine and pelvic girdle. Multifocal patchy airspace opacities are present throughout the lungs with the new right pleural effusion. IMPRESSION: Multifocal patchy airspace opacities throughout the lungs with the new right pleural effusion, concerning for multifocal pneumonia. Cluster of air-filled but nondistended loops of small bowel in the mid abdomen, nonspecific but could reflect developing ileus in the appropriate clinical setting. Electronically Signed   By: Lovena Le M.D.   On: 08/23/2018 21:57   Dg Chest Port 1 View  Result Date: 08/25/2018 CLINICAL DATA:  Respiratory failure EXAM: PORTABLE CHEST 1 VIEW COMPARISON:  August 23, 2018 FINDINGS: There is cardiomegaly with pulmonary venous hypertension. There is interstitial edema with pleural effusions bilaterally. There is patchy airspace opacity in the lung bases. There is aortic atherosclerosis. No adenopathy. There is evidence of old healed rib fractures bilaterally. There are surgical clips over the left apex region, stable. IMPRESSION: Pulmonary vascular congestion with interstitial edema and pleural effusions bilaterally. Airspace opacity in the bases is likely due to alveolar edema, although superimposed pneumonia cannot be excluded. The overall appearance is most consistent with congestive heart failure. Aortic Atherosclerosis (ICD10-I70.0). Electronically Signed   By: Lowella Grip III M.D.   On: 08/25/2018 08:22   Dg Chest Portable 1 View  Result Date: 08/23/2018 CLINICAL DATA:   Shortness of breath. EXAM: PORTABLE CHEST 1 VIEW COMPARISON:  Multiple prior chest x-rays, most recently dated July 31, 2018. FINDINGS: The heart size and mediastinal contours are within normal limits. Atherosclerotic calcification of the aortic arch. New pulmonary vascular congestion and diffuse interstitial thickening. The lungs remain hyperinflated with emphysematous changes. New patchy opacities at the right lung base. New small right pleural effusion. No pneumothorax. No acute osseous abnormality. Old bilateral rib fractures. IMPRESSION: 1. New diffuse interstitial pulmonary edema and small right pleural effusion. 2. New patchy airspace disease at the right lung base suspicious for pneumonia/aspiration. 3. COPD. Electronically Signed   By: Titus Dubin M.D.   On: 08/23/2018 18:00     ASSESSMENT AND PLAN:   Acute hypoxic respiratory failure secondary to combination of COPD and CHF. Weaned off BiPAP and now on room air.  *Acute on chronic systolic heart failure with EF 30 to 35%.  Patient has left ventricular diffuse hypokinesia, Continue Lasix. Monitor input and output.  Appreciate cardiology input.  *COPD.  Continue nebulizers and inhalers  *Atrial fibrillation with RVR, transferred to ICU last night for amiodarone drip, now heart rate improved, transition IV amiodarone to p.o. amiodarone, continue IV heparin, Cardiology input appreciated. Not a candidate for long-term anticoagulation.  Presently on heparin drip. . Elevated troponin- likely due to above Stable  AKI in CKD III-IV-creatinine today is up at 3.32, worsening renal failure (baseline 1.5-2.0) -Avoid nephrotoxic agents  Chronic iron deficiency anemia-hemoglobin at baseline -  Continue home ferrous sulfate  Tobacco use -Nicotine patch as needed  Alcohol abuse -CIWA -Thiamine, folate, MVI [place], palliative care consulted regarding advanced directive and CODE STATUS. All the records are  reviewed and case discussed with Care Management/Social Worker Management plans discussed with the patient, family and they are in agreement.  CODE STATUS: Full code More than 50% time spent in counseling, coordination of care TOTAL TIME TAKING CARE OF THIS PATIENT: 35 minutes.   POSSIBLE D/C IN 1-2 DAYS, DEPENDING ON CLINICAL CONDITION.  Epifanio Lesches M.D on 08/25/2018 at 4:12 PM  Between 7am to 6pm - Pager - [phone]  After 6pm go to [email or web address] - password [email or web address] [facility]  Office  [phone]  CC: Primary care physician; Leone Haven, MD  Note: This dictation was prepared with Dragon dictation along with smaller phrase technology. Any transcriptional errors that result from this process are unintentional.

## 2018-08-25 NOTE — Progress Notes (Signed)
[place] for Heparin  Indication: atrial fibrillation  Allergies  Allergen Reactions  . Aspirin Other (See Comments)    bleeding    Patient Measurements: Height: 5\' 7"  (170.2 cm) Weight: 120 lb 9.5 oz (54.7 kg) IBW/kg (Calculated) : 66.1 Heparin Dosing Weight: 66.1   Vital Signs: BP: 94/56 (08/18 0700) Pulse Rate: 74 (08/18 0700)  Labs: Recent Labs    08/23/18 1703 08/23/18 2212 08/24/18 0333 08/24/18 1036 08/24/18 1823 08/25/18 0750  HGB 11.3*  --  9.8*  --   --  9.9*  HCT 35.2*  --  30.8*  --   --  30.7*  PLT 249  --  228  --   --  229  APTT 29  --   --  29 44* 50*  LABPROT 13.4  --   --   --   --   --   INR 1.0  --   --   --   --   --   CREATININE 2.68*  --  2.76*  --   --  3.32*  TROPONINIHS 69* 59*  --   --   --   --     Estimated Creatinine Clearance: 13.3 mL/min (A) (by C-G formula based on SCr of 3.32 mg/dL (H)).   Medical History: Past Medical History:  Diagnosis Date  . Alcohol abuse   . C. difficile diarrhea 11/2017  . Chronic kidney disease (CKD), stage III (moderate) (HCC)   . Depression   . GERD (gastroesophageal reflux disease)   . GI bleed    a. bleeding duodenal ulcers in 04/2018 s/p cauterization and duodenalplasty  . History of melanoma   . Hypertension   . Osteoarthritis   . PAF (paroxysmal atrial fibrillation) ([place])    a. initially noted on 05/06/2018  . Tobacco abuse     Medications:  Medications Prior to Admission  Medication Sig Dispense Refill Last Dose  . calcium-vitamin D (OSCAL WITH D) 500-200 MG-UNIT tablet Take 1 tablet by mouth 2 (two) times daily. 60 tablet 1 unknown at Unknown time  . doxycycline (VIBRA-TABS) 100 MG tablet Take 1 tablet (100 mg total) by mouth 2 (two) times daily. 20 tablet 0 unknown at unknown  . DUREZOL 0.05 % EMUL Place 1 drop into the right eye daily.   unknkown at unknown  . FEROSUL 325 (65 Fe) MG tablet TAKE 1 TABLET BY MOUTH ONCE DAILY FOR SUPPLEMENT (Patient  taking differently: Take 325 mg by mouth daily. ) 30 tablet 0 unknown at unknown  . folic acid (FOLVITE) 1 MG tablet TAKE 1 TABLET BY MOUTH DAILY (Patient taking differently: Take 1 mg by mouth daily. ) 30 tablet 0 unknown at unknown  . mirtazapine (REMERON) 30 MG tablet Take 0.5 tablets (15 mg total) by mouth at bedtime. 30 tablet 0 unknown at unknown  . pantoprazole (PROTONIX) 40 MG tablet TAKE 1 TABLET DAILY AT 7:00 AM ON AN EMPTY STOMACH (Patient taking differently: Take 40 mg by mouth daily. ) 30 tablet 0 unknown at unknown  . sulfamethoxazole-trimethoprim (BACTRIM DS) 800-160 MG tablet Take 1 tablet by mouth 2 (two) times daily.   unknown at unknown  . tamsulosin (FLOMAX) 0.4 MG CAPS capsule TAKE 1 CAPSULE BY MOUTH DAILY (Patient taking differently: Take 0.4 mg by mouth daily. ) 30 capsule 0 unknown at unknown  . thiamine 100 MG tablet Take 1 tablet (100 mg total) by mouth daily. 30 tablet 1 unknown at unknown  . traZODone (  DESYREL) 100 MG tablet Take 1 tablet (100 mg total) by mouth at bedtime. 30 tablet 3 unknown at unknown  . ALPRAZolam (XANAX) 0.25 MG tablet Take 1 tablet (0.25 mg total) by mouth 2 (two) times daily as needed for anxiety. (Patient not taking: Reported on 08/07/2018) 12 tablet 0     Assessment:Pharmacy consulted for heparin drip management for [name] admitted to ICU with acute hypoxic respiratory distress secondary to CHF and COPD exacerbation. Patient with atrial fibrillation with RVR on admission and was previously on apixaban 2.5 mg BID. Heparin 5000 units [place] given 8/17 AM. Current heparin drip at 850 units/hr. APTT = 50 sec.   Goal of Therapy:  Heparin level 0.3-0.7 units/mL when aPTT correlate. Goal aPTT: 66-102 seconds Monitor platelets by anticoagulation protocol: Yes   Plan:  Initiate heparin bolus of 800 units. Then increase rate to 950 units/hr. Will recheck aPTT at 1700. Will check initial anti-Xa level with 8/19 AM labs.  Pharmacy will continue to  monitor and adjust per consult.  Josafat Enrico Dear Nicholes Mango 08/25/2018,9:04 AM

## 2018-08-25 NOTE — Progress Notes (Signed)
*  PRELIMINARY RESULTS* Echocardiogram 2D Echocardiogram has been performed.  Justin Andrews 08/25/2018, 10:01 AM

## 2018-08-26 DIAGNOSIS — Z7189 Other specified counseling: Secondary | ICD-10-CM

## 2018-08-26 DIAGNOSIS — I480 Paroxysmal atrial fibrillation: Secondary | ICD-10-CM

## 2018-08-26 DIAGNOSIS — I5021 Acute systolic (congestive) heart failure: Secondary | ICD-10-CM

## 2018-08-26 DIAGNOSIS — N179 Acute kidney failure, unspecified: Secondary | ICD-10-CM

## 2018-08-26 DIAGNOSIS — N183 Chronic kidney disease, stage 3 (moderate): Secondary | ICD-10-CM

## 2018-08-26 DIAGNOSIS — Z515 Encounter for palliative care: Secondary | ICD-10-CM

## 2018-08-26 LAB — BASIC METABOLIC PANEL
Anion gap: 7 (ref 5–15)
BUN: 68 mg/dL — ABNORMAL HIGH (ref 8–23)
CO2: 26 mmol/L (ref 22–32)
Calcium: 8.6 mg/dL — ABNORMAL LOW (ref 8.9–10.3)
Chloride: 104 mmol/L (ref 98–111)
Creatinine, Ser: 3.44 mg/dL — ABNORMAL HIGH (ref 0.61–1.24)
GFR calc Af Amer: 18 mL/min — ABNORMAL LOW (ref >60)
GFR calc non Af Amer: 16 mL/min — ABNORMAL LOW (ref >60)
Glucose, Bld: 100 mg/dL — ABNORMAL HIGH (ref 70–99)
Potassium: 4 mmol/L (ref 3.5–5.1)
Sodium: 137 mmol/L (ref 135–145)

## 2018-08-26 LAB — CBC
HCT: 29.3 % — ABNORMAL LOW (ref 39.0–52.0)
Hemoglobin: 9.4 g/dL — ABNORMAL LOW (ref 13.0–17.0)
MCH: 30.6 pg (ref 26.0–34.0)
MCHC: 32.1 g/dL (ref 30.0–36.0)
MCV: 95.4 fL (ref 80.0–100.0)
Platelets: 193 10*3/uL (ref 150–400)
RBC: 3.07 MIL/uL — ABNORMAL LOW (ref 4.22–5.81)
RDW: 17.4 % — ABNORMAL HIGH (ref 11.5–15.5)
WBC: 7.8 10*3/uL (ref 4.0–10.5)
nRBC: 0 % (ref 0.0–0.2)

## 2018-08-26 LAB — APTT
aPTT: 86 seconds — ABNORMAL HIGH (ref 24–36)
aPTT: 94 seconds — ABNORMAL HIGH (ref 24–36)

## 2018-08-26 LAB — HEPARIN LEVEL (UNFRACTIONATED): Heparin Unfractionated: 0.62 IU/mL (ref 0.30–0.70)

## 2018-08-26 MED ORDER — TRAMADOL HCL 50 MG PO TABS
50.0000 mg | ORAL_TABLET | Freq: Two times a day (BID) | ORAL | Status: DC
  Administered 2018-08-26 – 2018-08-27 (×3): 100 mg via ORAL
  Administered 2018-08-28: 21:00:00 50 mg via ORAL
  Administered 2018-08-28: 100 mg via ORAL
  Administered 2018-08-29 – 2018-08-30 (×3): 50 mg via ORAL
  Administered 2018-08-30 – 2018-09-01 (×5): 100 mg via ORAL
  Administered 2018-09-02: 50 mg via ORAL
  Administered 2018-09-02 – 2018-09-04 (×4): 100 mg via ORAL
  Filled 2018-08-26 (×4): qty 2
  Filled 2018-08-26 (×2): qty 1
  Filled 2018-08-26: qty 2
  Filled 2018-08-26: qty 1
  Filled 2018-08-26 (×9): qty 2
  Filled 2018-08-26: qty 1

## 2018-08-26 MED ORDER — HEPARIN SODIUM (PORCINE) 5000 UNIT/ML IJ SOLN
5000.0000 [IU] | Freq: Three times a day (TID) | INTRAMUSCULAR | Status: DC
  Administered 2018-08-26 – 2018-09-04 (×26): 5000 [IU] via SUBCUTANEOUS
  Filled 2018-08-26 (×27): qty 1

## 2018-08-26 MED ORDER — FUROSEMIDE 40 MG PO TABS: 40.0000 mg | ORAL_TABLET | Freq: Every day | ORAL | Status: DC

## 2018-08-26 NOTE — Care Management Important Message (Signed)
Important Message  Patient Details  Name: Justin Andrews MRN: [id number] Date of Birth: 10/04/1934   Medicare Important Message Given:  Yes     Juliann Pulse A Dayn Barich 08/26/2018, 10:50 AM

## 2018-08-26 NOTE — Progress Notes (Signed)
[place] for Heparin  Indication: atrial fibrillation  Allergies  Allergen Reactions  . Aspirin Other (See Comments)    bleeding   Patient Measurements: Height: 5\' 7"  (170.2 cm) Weight: 120 lb 9.5 oz (54.7 kg) IBW/kg (Calculated) : 66.1 Heparin Dosing Weight: 66.1   Vital Signs: Temp: 97.9 F (36.6 C) (08/18 1917) Temp Source: Oral (08/18 1408) BP: 110/57 (08/18 1917) Pulse Rate: 79 (08/18 1917)  Labs: Recent Labs    08/23/18 1703 08/23/18 2212 08/24/18 0333  08/25/18 0750 08/25/18 1708 08/26/18 0005  HGB 11.3*  --  9.8*  --  9.9*  --   --   HCT 35.2*  --  30.8*  --  30.7*  --   --   PLT 249  --  228  --  229  --   --   APTT 29  --   --    < > 50* 61* 94*  LABPROT 13.4  --   --   --   --   --   --   INR 1.0  --   --   --   --   --   --   CREATININE 2.68*  --  2.76*  --  3.32*  --   --   TROPONINIHS 69* 59*  --   --   --   --   --    < > = values in this interval not displayed.    Estimated Creatinine Clearance: 13.3 mL/min (A) (by C-G formula based on SCr of 3.32 mg/dL (H)).   Medical History: Past Medical History:  Diagnosis Date  . Alcohol abuse   . C. difficile diarrhea 11/2017  . Chronic kidney disease (CKD), stage III (moderate) (HCC)   . Depression   . GERD (gastroesophageal reflux disease)   . GI bleed    a. bleeding duodenal ulcers in 04/2018 s/p cauterization and duodenalplasty  . History of melanoma   . Hypertension   . Osteoarthritis   . PAF (paroxysmal atrial fibrillation) ([place])    a. initially noted on 05/06/2018  . Tobacco abuse     Medications:  Medications Prior to Admission  Medication Sig Dispense Refill Last Dose  . calcium-vitamin D (OSCAL WITH D) 500-200 MG-UNIT tablet Take 1 tablet by mouth 2 (two) times daily. 60 tablet 1 unknown at Unknown time  . doxycycline (VIBRA-TABS) 100 MG tablet Take 1 tablet (100 mg total) by mouth 2 (two) times daily. 20 tablet 0 unknown at unknown  . DUREZOL 0.05  % EMUL Place 1 drop into the right eye daily.   unknkown at unknown  . FEROSUL 325 (65 Fe) MG tablet TAKE 1 TABLET BY MOUTH ONCE DAILY FOR SUPPLEMENT (Patient taking differently: Take 325 mg by mouth daily. ) 30 tablet 0 unknown at unknown  . folic acid (FOLVITE) 1 MG tablet TAKE 1 TABLET BY MOUTH DAILY (Patient taking differently: Take 1 mg by mouth daily. ) 30 tablet 0 unknown at unknown  . mirtazapine (REMERON) 30 MG tablet Take 0.5 tablets (15 mg total) by mouth at bedtime. 30 tablet 0 unknown at unknown  . pantoprazole (PROTONIX) 40 MG tablet TAKE 1 TABLET DAILY AT 7:00 AM ON AN EMPTY STOMACH (Patient taking differently: Take 40 mg by mouth daily. ) 30 tablet 0 unknown at unknown  . sulfamethoxazole-trimethoprim (BACTRIM DS) 800-160 MG tablet Take 1 tablet by mouth 2 (two) times daily.   unknown at unknown  . tamsulosin (FLOMAX) 0.4 MG  CAPS capsule TAKE 1 CAPSULE BY MOUTH DAILY (Patient taking differently: Take 0.4 mg by mouth daily. ) 30 capsule 0 unknown at unknown  . thiamine 100 MG tablet Take 1 tablet (100 mg total) by mouth daily. 30 tablet 1 unknown at unknown  . traZODone (DESYREL) 100 MG tablet Take 1 tablet (100 mg total) by mouth at bedtime. 30 tablet 3 unknown at unknown  . ALPRAZolam (XANAX) 0.25 MG tablet Take 1 tablet (0.25 mg total) by mouth 2 (two) times daily as needed for anxiety. (Patient not taking: Reported on 08/07/2018) 12 tablet 0    Assessment:  Pharmacy consulted for heparin drip management for [name] admitted to ICU with acute hypoxic respiratory distress secondary to CHF and COPD exacerbation. Patient with atrial fibrillation with RVR on admission and was previously on apixaban 2.5 mg BID. Heparin 5000 units [place] given 8/17 AM. Current heparin drip at 850 units/hr. APTT = 50 sec.   8/18 aPTT @1708 : 61 subtherapeutic- confirmed with nurse no interruptions.  8/19 @ 0005 aPTT = 94, therapeutic on rate of 1050 units/hr  Goal of Therapy:  Heparin level 0.3-0.7 units/mL  when aPTT correlate. Goal aPTT: 66-102 seconds Monitor platelets by anticoagulation protocol: Yes   Plan:  Continue Heparin at current rate of 1050 units/hr. Will recheck aPTT at 0800 . Will check initial anti-Xa level with 8/19 AM labs.  Pharmacy will continue to monitor and adjust per consult.  [place] Crane, Devita Nies A 08/26/2018,12:56 AM

## 2018-08-26 NOTE — Consult Note (Signed)
Consultation Note Date: 08/26/2018   Patient Name: Justin Andrews  DOB: [birth year]  MRN: [id number]  Age / Sex: 84 y.o., male   PCP: Leone Haven, MD Referring Physician: Epifanio Lesches, MD   REASON FOR CONSULTATION:Establishing goals of care  Palliative Care consult requested for goals of care in this 84 y.o. male with multiple medical problems including melanoma, hypertension, depression, tobacco use, chronic kidney disease III, and alcohol abuse. He presented to the ED with shortness of breath which he reported was becoming progressively worse. He also endorsed lower extremity edema. During his ED work up Mr. Cullen was found to be tachycardic with heart rate 111 and in new onset A. Fib.  Labs were significant for creatinine 2.68, BNP 1820, troponin 69, hemoglobin 11.3.  Chest x-ray with new diffuse pulmonary edema and patchy airspace disease at the right lung, suspicious for pneumonia.  Since his admission he has been followed by cardiology.  He has transition from IV amiodarone to p.o. Patient's heparin infusion has been discontinued and he has also been deemed a poor long-term candidate for anticoagulation.  Continues to have worsening renal function and will consider a poor dialysis candidate due to multiple comorbidities and frailty.  Clinical Assessment and Goals of Care: I have reviewed medical records including lab results, imaging, Epic notes, and MAR, received report from the bedside RN, and assessed the patient.  Patient is considered a ward of the state and have a legal guardian.  I spoke with Bridgette Habermann, legal guardian (DSS) to discuss diagnosis prognosis, [place], EOL wishes, disposition and options.  She is awake and alert assessment.  Denies pain or shortness of breath.  I introduced Palliative Medicine as specialized medical care for people living with serious illness. It focuses on providing relief from the symptoms and stress of a serious illness. The goal is to  improve quality of life for both the patient and the caregivers.  Jerene Pitch shared that patient became under the care of APS in July after multiple concerns regarding his safety and care by his family members including his son.  She reports patient has a longstanding history of substance abuse, noncompliance, and hallucinations.  She shares he was evaluated previously and deemed incompetent of decision-making only resulting in DSS gaining legal guardianship.  We discussed His current illness and what it means in the larger context of His on-going co-morbidities. With specific discussions regarding is worsening CHF, COPD, renal failure, and overall functional decline.  Natural disease trajectory and expectations at EOL were discussed.  Brooke verbalized understanding and appreciation of updates.  I educated her on patient's poor prognosis and limited medical interventions given his frailty and comorbidities.  The difference between aggressive medical intervention and comfort care was considered in light of the patient's goals of care.  Brooke verbalized understanding of patient's condition. She states she will need to discuss further with her director for further decision making. She is requesting guidance with disposition. She states they will need a PT evaluation for ability to discharge to ALF versus SNF.   I discussed patient's poor prognosis and the recommendation for hospice outpatient involvement. I explained criteria for hospice admission which patient does meet a life expectancy of 6 months or less given his comorbidities.  I explained to her patient would not be able to discharge to a skilled nursing facility with rehabilitation and have outpatient hospice support.  Explained to patient with require a more long-term care status versus rehabilitation.  We also  discussed residential hospice once patient approaches a 2-week or less life expectancy which could potentially be sooner than later given his  continued decline.  She verbalized understanding expressing they will consider this in their decision-making and would like to continue to have updates regarding patient's condition.  Advanced directives, concepts specific to code status, artifical feeding and hydration, and rehospitalization were considered and discussed.  Discussed patient's full CODE STATUS with legal guardian with consideration of his current illness and comorbidities.  She verbalized understanding and requested documentation with recommendations to be faxed for presentation to her daughter return and approval with a signature.  We discussed in detail the MOLST form.  MOST form completed with discussed options, DNR, comfort measures, no antibiotics or IV fluids, no feeding tube, and legal guardian box selected. MOST form faxed to given number for approval along with a black form in the event Brooke's director felt differently about our discussed options. Per Jerene Pitch she will return signed document by fax once completed (within 24hrs).   She again is requesting PT evaluation and continued follow-up with recommendations and guidance to disposition plans.   Questions and concerns were addressed. Legal guardian was encouraged to call with questions or concerns.  PMT will continue to support holistically.   SOCIAL HISTORY:     reports that he has been smoking cigarettes. He has a 70.00 pack-year smoking history. He has never used smokeless tobacco. He reports that he does not drink alcohol or use drugs.  CODE STATUS: Full code-MOST form faxed to Legal Guardian for completion with signature with forming specifying DNR as requested.   ADVANCE DIRECTIVES: Bridgette Habermann, Legal DSS Guardian    SYMPTOM MANAGEMENT: per attending   Palliative Prophylaxis:   Aspiration, Bowel Regimen, Delirium Protocol, Frequent Pain Assessment, Oral Care and Turn Reposition  PSYCHO-SOCIAL/SPIRITUAL:  Support System: Guardian   Desire for further  Chaplaincy support:NO   Additional Recommendations (Limitations, Scope, Preferences):  Full Scope Treatment   PAST MEDICAL HISTORY: Past Medical History:  Diagnosis Date  . Alcohol abuse   . C. difficile diarrhea 11/2017  . Chronic kidney disease (CKD), stage III (moderate) (HCC)   . Depression   . GERD (gastroesophageal reflux disease)   . GI bleed    a. bleeding duodenal ulcers in 04/2018 s/p cauterization and duodenalplasty  . History of melanoma   . Hypertension   . Osteoarthritis   . PAF (paroxysmal atrial fibrillation) ([place])    a. initially noted on 05/06/2018  . Tobacco abuse     PAST SURGICAL HISTORY:  Past Surgical History:  Procedure Laterality Date  . ANKLE FRACTURE SURGERY    . CATARACT EXTRACTION W/PHACO Right 08/13/2018   Procedure: CATARACT EXTRACTION PHACO AND INTRAOCULAR LENS PLACEMENT ([place]) RIGHT [place];  Surgeon: Marchia Meiers, MD;  Location: [facility] ORS;  Service: Ophthalmology;  Laterality: Right;  [place]  01:16 CDE 13.15 Fluid pack lot # [id number]  . CENTRAL VENOUS CATHETER INSERTION  05/08/2018   Procedure: INSERTION CENTRAL LINE ADULT;  Surgeon: Olean Ree, MD;  Location: [facility];  Service: General;;  . ESOPHAGOGASTRODUODENOSCOPY  05/08/2018   Procedure: ESOPHAGOGASTRODUODENOSCOPY (EGD);  Surgeon: Olean Ree, MD;  Location: [facility];  Service: General;;  . ESOPHAGOGASTRODUODENOSCOPY (EGD) WITH PROPOFOL N/A 05/03/2018   Procedure: ESOPHAGOGASTRODUODENOSCOPY (EGD) WITH PROPOFOL;  Surgeon: [place], Benay Pike, MD;  Location: [facility];  Service: Gastroenterology;  Laterality: N/A;  . EXTERNAL EAR SURGERY    . INTRAMEDULLARY (IM) NAIL INTERTROCHANTERIC Left 12/01/2017   Procedure: INTRAMEDULLARY (IM) NAIL INTERTROCHANTRIC;  Surgeon: Thornton Park, MD;  Location: [facility];  Service: Orthopedics;  Laterality: Left;  . JEJUNOSTOMY  05/08/2018   Procedure: JEJUNOSTOMY;  Surgeon: Olean Ree, MD;  Location: [facility];  Service: General;;  .  LAPAROTOMY N/A 05/08/2018   Procedure: EXPLORATORY LAPAROTOMY;  Surgeon: Olean Ree, MD;  Location: [facility];  Service: General;  Laterality: N/A;  . ROTATOR CUFF REPAIR    . TONSILLECTOMY    . TYMPANOPLASTY W/ MASTOIDECTOMY     Patient with reports of mastoid surgery (appears to have had surgery on TM; Left).  . VISCERAL ARTERY INTERVENTION N/A 05/04/2018   Procedure: VISCERAL ARTERY INTERVENTION;  Surgeon: Algernon Huxley, MD;  Location: [facility];  Service: Cardiovascular;  Laterality: N/A;    ALLERGIES:  is allergic to aspirin.   MEDICATIONS:  Current Facility-Administered Medications  Medication Dose Route Frequency Provider Last Rate Last Dose  . acetaminophen (TYLENOL) tablet 650 mg  650 mg Oral Q6H PRN Sela Hua, MD   650 mg at 08/25/18 0109   Or  . acetaminophen (TYLENOL) suppository 650 mg  650 mg Rectal Q6H PRN Mayo, Pete Pelt, MD      . amiodarone (PACERONE) tablet 400 mg  400 mg Oral BID Wilhelmina Mcardle, MD   400 mg at 08/26/18 1013  . calcium-vitamin D (OSCAL WITH D) 500-200 MG-UNIT per tablet 1 tablet  1 tablet Oral BID Mayo, Pete Pelt, MD   1 tablet at 08/26/18 1013  . chlorhexidine (PERIDEX) 0.12 % solution 15 mL  15 mL Mouth Rinse BID Tukov-Yual, Magdalene S, NP   15 mL at 08/25/18 2002  . Chlorhexidine Gluconate Cloth 2 % PADS 6 each  6 each Topical Daily Mayo, Pete Pelt, MD   6 each at 08/23/18 2135  . ferrous sulfate tablet 325 mg  325 mg Oral Daily Mayo, Pete Pelt, MD   325 mg at 08/26/18 1013  . ipratropium-albuterol (DUONEB) 0.5-2.5 (3) MG/3ML nebulizer solution 3 mL  3 mL Nebulization Q6H Mayo, Pete Pelt, MD   3 mL at 08/26/18 0728  . ipratropium-albuterol (DUONEB) 0.5-2.5 (3) MG/3ML nebulizer solution 3 mL  3 mL Nebulization Q4H PRN Epifanio Lesches, MD   3 mL at 08/25/18 1754  . LORazepam (ATIVAN) tablet 1 mg  1 mg Oral Q4H PRN Wilhelmina Mcardle, MD      . MEDLINE mouth rinse  15 mL Mouth Rinse q12n4p Tukov-Yual, Magdalene S, NP   15 mL at  08/25/18 1420  . metoprolol tartrate (LOPRESSOR) tablet 12.5 mg  12.5 mg Oral BID Wilhelmina Mcardle, MD   12.5 mg at 08/26/18 1012  . mirtazapine (REMERON) tablet 15 mg  15 mg Oral QHS Mayo, Pete Pelt, MD   15 mg at 08/25/18 2003  . nicotine (NICODERM CQ - dosed in mg/24 hours) patch 14 mg  14 mg Transdermal Daily PRN Mayo, Pete Pelt, MD      . ondansetron [facility]) injection 4 mg  4 mg Intravenous Q6H PRN Mayo, Pete Pelt, MD      . pantoprazole (PROTONIX) EC tablet 40 mg  40 mg Oral Daily Mayo, Pete Pelt, MD   40 mg at 08/26/18 1012  . polyethylene glycol (MIRALAX / GLYCOLAX) packet 17 g  17 g Oral Daily Tukov-Yual, Magdalene S, NP   17 g at 08/25/18 0750  . prednisoLONE acetate (PRED FORTE) 1 % ophthalmic suspension 1 drop  1 drop Right Eye QID Charlett Nose, RPH   1 drop at 08/26/18 1018  .  tamsulosin (FLOMAX) capsule 0.4 mg  0.4 mg Oral Daily Mayo, Pete Pelt, MD   0.4 mg at 08/25/18 2105  . traMADol (ULTRAM) tablet 50-100 mg  50-100 mg Oral Q6H PRN Wilhelmina Mcardle, MD   100 mg at 08/26/18 1012  . traZODone (DESYREL) tablet 100 mg  100 mg Oral QHS Mayo, Pete Pelt, MD   100 mg at 08/25/18 2101    VITAL SIGNS: BP (!) 120/57 (BP Location: Right Arm)   Pulse 76   Temp 97.7 F (36.5 C) (Oral)   Resp 16   Ht 5\' 7"  (1.702 m)   Wt 54.7 kg   SpO2 99%   BMI 18.89 kg/m  Filed Weights   08/23/18 1651 08/23/18 2100  Weight: 54.4 kg 54.7 kg    Estimated body mass index is 18.89 kg/m as calculated from the following:   Height as of this encounter: 5\' 7"  (1.702 m).   Weight as of this encounter: 54.7 kg.  LABS: CBC:    Component Value Date/Time   WBC 7.8 08/26/2018 0431   HGB 9.4 (L) 08/26/2018 0431   HCT 29.3 (L) 08/26/2018 0431   PLT 193 08/26/2018 0431   Comprehensive Metabolic Panel:    Component Value Date/Time   NA 137 08/26/2018 0431   K 4.0 08/26/2018 0431   CO2 26 08/26/2018 0431   BUN 68 (H) 08/26/2018 0431   CREATININE 3.44 (H) 08/26/2018 0431   CREATININE 2.08 (H)  02/13/2018 1518   ALBUMIN 3.8 08/23/2018 1703     Review of Systems Unless otherwise noted, a complete review of systems is negative.  Physical Exam General: NAD, frail chronically-ill appearing Cardiovascular: regular rate and rhythm, murmur Pulmonary: clear ant fields Abdomen: soft, nontender, + bowel sounds Extremities: trace edema, no joint deformities Neurological: Weakness, alert, oriented x3   Prognosis: < 6 months in the setting of atrial fibrillation (poor long-term candidate for anticoagulation), acute systolic heart failure (unable to provide maximal medical interventions due to worsening renal failure), CKD 3 (poor dialysis candidate), acute respiratory failure with hypoxia, COPD, deconditioned, history of substance abuse.  Discharge Planning:  To Be Determined  Recommendations:  Full code-extensive discussion with legal guardian Bridgette Habermann).  At her request MOST form completed with selected options including DNR to present to her director for approval and signature.  Form was faxed to given [phone].  Completed form with signature will be faxed back to unit.   Guardian requesting PT evaluation for further guidance with disposition (ALF versus SNF/LTC).  Guardian is aware of recommendations for hospice support due to comorbidities and frailty.  Also understands patient cannot discharge to a facility under rehabilitation and also have hospice support.  PMT will continue to support and follow.   Palliative Performance Scale: PPS 30%              Legal guardian expressed understanding and was in agreement with this plan.   Thank you for allowing the Palliative Medicine Team to assist in the care of this patient.  Time In: 1130 Time Out: 1225 Time Total: 55 min.   Visit consisted of counseling and education dealing with the complex and emotionally intense issues of symptom management and palliative care in the setting of serious and potentially  life-threatening illness.Greater than 50%  of this time was spent counseling and coordinating care related to the above assessment and plan.  Signed by:  Alda Lea, AGPCNP-BC Palliative Medicine Team  Phone: [phone] Fax: [phone] Pager: [phone] Amion: Bjorn Pippin

## 2018-08-26 NOTE — Progress Notes (Signed)
Progress Note  Patient Name: Justin Andrews Date of Encounter: 08/26/2018  Primary Cardiologist: Fletcher Anon  Subjective   Patient is fatigued and says he did not sleep well.  No chest pain, shortness of breath, or palpitations.  Edema resolved.  Inpatient Medications    Scheduled Meds:  amiodarone  400 mg Oral BID   calcium-vitamin D  1 tablet Oral BID   chlorhexidine  15 mL Mouth Rinse BID   Chlorhexidine Gluconate Cloth  6 each Topical Daily   ferrous sulfate  325 mg Oral Daily   ipratropium-albuterol  3 mL Nebulization Q6H   mouth rinse  15 mL Mouth Rinse q12n4p   metoprolol tartrate  12.5 mg Oral BID   mirtazapine  15 mg Oral QHS   pantoprazole  40 mg Oral Daily   polyethylene glycol  17 g Oral Daily   prednisoLONE acetate  1 drop Right Eye QID   tamsulosin  0.4 mg Oral Daily   traZODone  100 mg Oral QHS   Continuous Infusions:  heparin 1,050 Units/hr (08/25/18 1924)   PRN Meds: acetaminophen **OR** acetaminophen, ipratropium-albuterol, LORazepam, nicotine, [DISCONTINUED] ondansetron **OR** ondansetron (ZOFRAN) IV, traMADol   Vital Signs    Vitals:   08/25/18 1946 08/26/18 0438 08/26/18 0728 08/26/18 0803  BP:  113/63  (!) 120/57  Pulse:  74  76  Resp:  18  16  Temp:  97.6 F (36.4 C)    TempSrc:  Oral    SpO2: 94% 92% 97% 99%  Weight:      Height:        Intake/Output Summary (Last 24 hours) at 08/26/2018 0954 Last data filed at 08/26/2018 0540 Gross per 24 hour  Intake 726.59 ml  Output 1750 ml  Net -1023.41 ml   Last 3 Weights 08/23/2018 08/23/2018 08/17/2018  Weight (lbs) 120 lb 9.5 oz 120 lb 125 lb  Weight (kg) 54.7 kg 54.432 kg 56.7 kg      Telemetry    Predominantly sinus rhythm though episodes of atrial fibrillation with rapid ventricular response noted this morning.  Patient currently in sinus rhythm. - Personally Reviewed  ECG    No new tracing.  Physical Exam   GEN:  Frail, chronically ill-appearing man lying in bed. Neck:  No JVD Cardiac:  Regular rate and rhythm with 1/6 systolic murmur.  No rubs or gallops. Respiratory:  Mildly diminished throughout without wheezes or crackles. GI: Soft, nontender, non-distended  MS: No edema; No deformity. Neuro:  Nonfocal  Psych: Normal affect   Labs    High Sensitivity Troponin:   Recent Labs  Lab 08/23/18 1703 08/23/18 2212  TROPONINIHS 69* 59*      Cardiac EnzymesNo results for input(s): TROPONINI in the last 168 hours. No results for input(s): TROPIPOC in the last 168 hours.   Chemistry Recent Labs  Lab 08/23/18 1703 08/24/18 0333 08/25/18 0750 08/26/18 0431  NA 137 139 135 137  K 4.1 4.1 4.1 4.0  CL 100 97* 100 104  CO2 23 24 25 26   GLUCOSE 126* 134* 124* 100*  BUN 35* 41* 63* 68*  CREATININE 2.68* 2.76* 3.32* 3.44*  CALCIUM 9.4 8.7* 8.6* 8.6*  PROT 7.2  --   --   --   ALBUMIN 3.8  --   --   --   AST 37  --   --   --   ALT 34  --   --   --   ALKPHOS 116  --   --   --  BILITOT 0.8  --   --   --   GFRNONAA 21* 20* 16* 16*  GFRAA 25* 24* 19* 18*  ANIONGAP 14 18* 10 7     Hematology Recent Labs  Lab 08/24/18 0333 08/25/18 0750 08/26/18 0431  WBC 4.0 9.1 7.8  RBC 3.25* 3.25* 3.07*  HGB 9.8* 9.9* 9.4*  HCT 30.8* 30.7* 29.3*  MCV 94.8 94.5 95.4  MCH 30.2 30.5 30.6  MCHC 31.8 32.2 32.1  RDW 17.4* 17.4* 17.4*  PLT 228 229 193    BNP Recent Labs  Lab 08/23/18 1703 08/25/18 0750  BNP 1,820.0* 963.0*     DDimer No results for input(s): DDIMER in the last 168 hours.   Radiology    Dg Chest Port 1 View  Result Date: 08/25/2018 CLINICAL DATA:  Respiratory failure EXAM: PORTABLE CHEST 1 VIEW COMPARISON:  August 23, 2018 FINDINGS: There is cardiomegaly with pulmonary venous hypertension. There is interstitial edema with pleural effusions bilaterally. There is patchy airspace opacity in the lung bases. There is aortic atherosclerosis. No adenopathy. There is evidence of old healed rib fractures bilaterally. There are surgical clips  over the left apex region, stable. IMPRESSION: Pulmonary vascular congestion with interstitial edema and pleural effusions bilaterally. Airspace opacity in the bases is likely due to alveolar edema, although superimposed pneumonia cannot be excluded. The overall appearance is most consistent with congestive heart failure. Aortic Atherosclerosis (ICD10-I70.0). Electronically Signed   By: Lowella Grip III M.D.   On: 08/25/2018 08:22    Cardiac Studies   Echo (08/25/2018):  1. The left ventricle has moderate-severely reduced systolic function, with an ejection fraction of 30-35%. The cavity size was normal. There is mildly increased left ventricular wall thickness. Left ventricular diastolic function could not be evaluated  secondary to atrial fibrillation. Left ventricular diffuse hypokinesis.  2. Left atrial size was mild-moderately dilated.  3. Right atrial size was mildly dilated.  4. The mitral valve is degenerative. Mild thickening of the mitral valve leaflet. Mitral valve regurgitation is moderate to severe by color flow Doppler.  5. The tricuspid valve is grossly normal. Tricuspid valve regurgitation is mild-moderate.  6. The aortic valve was not well visualized. Mild thickening of the aortic valve. Aortic valve regurgitation is mild by color flow Doppler.  7. The aorta is normal unless otherwise noted.  8. The interatrial septum was not well visualized.  Patient Profile     84 y.o. male with history of paroxysmal atrial fibrillation, hypertension, acute on chronic kidney disease, and anemia of chronic disease complicated by acute GI bleed in 4/20, admitted with acute respiratory failure with hypoxia and atrial fibrillation.  Assessment & Plan    Paroxysmal atrial fibrillation: Patient maintaining mostly sinus rhythm, though some episodes of atrial fibrillation with rapid ventricular response still noted.  Patient seems to be unaware of these episodes.  Continue amiodarone 400 mg  twice daily to complete 10 g load.  Continue metoprolol tartrate 12.5 mg twice daily; I am hesitant to escalate this further given low resting blood pressure.  Discontinue heparin infusion, as the patient is a poor long-term candidate for anticoagulation given recurrent falls, significant upper GI bleed earlier this year, and history of substance abuse.  Acute systolic heart failure: LVEF noted to be moderately to severely reduced by echo yesterday, complicated by moderate to severe mitral regurgitation.  Patient appears euvolemic on exam today.  Creatinine also continues to rise in the setting of ongoing diuresis.  Discontinue furosemide with careful monitoring of volume  status.  Would aim to maintain a net even fluid balance.  Continue metoprolol tartrate 12.5 mg twice daily; consider transitioning to metoprolol succinate prior to discharge.  Unable to add ACE inhibitor/ARB or aldosterone antagonist in the setting of worsening renal failure.  Given multiple comorbidities, recommend deferring ischemia evaluation at this time.  Acute kidney injury superimposed on chronic kidney disease stage III: Creatinine slightly higher today and notably above baseline.  Patient appears euvolemic today.  Stop furosemide.  Encourage gentle oral hydration with goal net even fluid balance.  Avoid nephrotoxic drugs.  Acute respiratory failure with hypoxia: Patient improves better breathing today.  He appears euvolemic on exam.  I suspect that his dyspnea and hypoxia are multifactorial, including heart failure and underlying lung disease.  Ongoing management of heart failure/cardiomyopathy, as above.  Management of lung disease per primary service.  For questions or updates, please contact [place] Please consult [email or web address] for contact info under [facility].     Signed, Nelva Bush, MD  08/26/2018, 9:54 AM

## 2018-08-26 NOTE — Plan of Care (Signed)
Patient alert and oriented, complaining of having difficulty sleeping- MD aware, patient to receive medication at bedtime. Heparin Drip stopped this shift. Patient initially refused all PO meds, stating that he had a hard time swallowing them. Patient consented with pills crushed in applesauce. Medicated for pain once this shift with minimal relief for chronic knee and ankle pain.

## 2018-08-27 DIAGNOSIS — I480 Paroxysmal atrial fibrillation: Secondary | ICD-10-CM

## 2018-08-27 LAB — BASIC METABOLIC PANEL
Anion gap: 5 (ref 5–15)
BUN: 63 mg/dL — ABNORMAL HIGH (ref 8–23)
CO2: 29 mmol/L (ref 22–32)
Calcium: 9.1 mg/dL (ref 8.9–10.3)
Chloride: 104 mmol/L (ref 98–111)
Creatinine, Ser: 3.42 mg/dL — ABNORMAL HIGH (ref 0.61–1.24)
GFR calc Af Amer: 18 mL/min — ABNORMAL LOW (ref >60)
GFR calc non Af Amer: 16 mL/min — ABNORMAL LOW (ref >60)
Glucose, Bld: 111 mg/dL — ABNORMAL HIGH (ref 70–99)
Potassium: 4.6 mmol/L (ref 3.5–5.1)
Sodium: 138 mmol/L (ref 135–145)

## 2018-08-27 MED ORDER — ADULT MULTIVITAMIN W/MINERALS CH
1.0000 | ORAL_TABLET | Freq: Every day | ORAL | Status: DC
  Administered 2018-08-28 – 2018-09-04 (×8): 1 via ORAL
  Filled 2018-08-27 (×9): qty 1

## 2018-08-27 MED ORDER — ENSURE MAX PROTEIN PO LIQD
11.0000 [oz_av] | Freq: Two times a day (BID) | ORAL | Status: DC
  Administered 2018-08-27 – 2018-09-01 (×9): 11 [oz_av] via ORAL
  Administered 2018-09-02: 237 mL via ORAL
  Administered 2018-09-03 (×2): 11 [oz_av] via ORAL
  Filled 2018-08-27: qty 330

## 2018-08-27 NOTE — TOC Progression Note (Addendum)
Transition of Care [facility] - Progression Note    Patient Details  Name: Justin Andrews MRN: [id number] Date of Birth: 1934/03/18  Transition [facility]) CM/SW Contact  Shelbie Hutching, RN Phone Number: 08/27/2018, 4:01 PM  Clinical Narrative:    Patient will discharge back to the group home when medically stable.  Group home owner Lavada Mesi would like to be called the day of discharge, her number is [phone].  RNCM attempted to call social worker Bridgette Habermann but her voicemail box was full, message left with Milinda Antis the Field seismologist instead.  Patient will benefit from home health services at discharge- DSS needs to make choice of agency.  PT worked with patient and reports that he has a walker but needs a wheelchair.  Lavada Mesi says that patient can transport via taxi- they have a contract with [facility] should assess tomorrow to make sure that taxi is safe transport option.    Expected Discharge Plan: Group Home Barriers to Discharge: Continued Medical Work up  Expected Discharge Plan and Services Expected Discharge Plan: Group Home       Living arrangements for the past 2 months: Group Home                                       Social Determinants of Health (SDOH) Interventions    Readmission Risk Interventions Readmission Risk Prevention Plan 05/07/2018  Transportation Screening Complete  PCP or Specialist Appt within 3-5 Days Complete  HRI or Home Care Consult Complete  Medication Review (RN Care Manager) Complete  Some recent data might be hidden

## 2018-08-27 NOTE — Progress Notes (Signed)
[place] [facility] [place] NAME: Justin Andrews    MR#:  [id number]  DATE OF BIRTH:  07-Oct-1934  SUBJECTIVE: Says that he feels very anxious.  No chest pain or shortness of breath..  CHIEF COMPLAINT:   Chief Complaint  Patient presents with  . Weakness  . Shortness of Breath   Feels very anxious and requesting something for sleep tonight. REVIEW OF SYSTEMS:    Review of Systems  Constitutional: Negative for chills, fever and malaise/fatigue.  HENT: Negative for sore throat.   Eyes: Negative for blurred vision, double vision and pain.  Respiratory: Negative for cough, hemoptysis, shortness of breath and wheezing.   Cardiovascular: Negative for chest pain, palpitations, orthopnea and leg swelling.  Gastrointestinal: Negative for abdominal pain, constipation, diarrhea, heartburn, nausea and vomiting.  Genitourinary: Negative for dysuria and hematuria.  Musculoskeletal: Negative for back pain and joint pain.  Skin: Negative for rash.  Neurological: Negative for sensory change, speech change, focal weakness and headaches.  Endo/Heme/Allergies: Does not bruise/bleed easily.  Psychiatric/Behavioral: Positive for memory loss. Negative for depression. The patient has insomnia.     DRUG ALLERGIES:   Allergies  Allergen Reactions  . Aspirin Other (See Comments)    bleeding    VITALS:  Blood pressure 115/67, pulse 65, temperature 97.9 F (36.6 C), resp. rate 18, height 5\' 7"  (1.702 m), weight 54.7 kg, SpO2 97 %.  PHYSICAL EXAMINATION:   Physical Exam  GENERAL:  84 y.o.-year-old patient lying in the bed with no acute distress.  EYES: Pupils equal, round, reactive to light and accommodation. No scleral icterus. Extraocular muscles intact.  HEENT: Head atraumatic, normocephalic. Oropharynx and nasopharynx clear.  NECK:  Supple, no jugular venous distention. No thyroid enlargement, no tenderness.  LUNGS: Normal breath sounds bilaterally, no wheezing,  rales, rhonchi. No use of accessory muscles of respiration.  CARDIOVASCULAR: S1, S2 iregular no murmurs, rubs, or gallops.  ABDOMEN: Soft, nontender, nondistended. Bowel sounds present. No organomegaly or mass.  EXTREMITIES: No cyanosis, clubbing or edema b/l.    NEUROLOGIC: Cranial nerves II through XII are intact. No focal Motor or sensory deficits b/l.   PSYCHIATRIC: The patient is alert and awake has anxiety SKIN: No obvious rash, lesion, or ulcer.   LABORATORY PANEL:   CBC Recent Labs  Lab 08/26/18 0431  WBC 7.8  HGB 9.4*  HCT 29.3*  PLT 193   ------------------------------------------------------------------------------------------------------------------ Chemistries  Recent Labs  Lab 08/23/18 1703  08/24/18 0333  08/27/18 0518  NA 137  --  139   < > 138  K 4.1  --  4.1   < > 4.6  CL 100  --  97*   < > 104  CO2 23  --  24   < > 29  GLUCOSE 126*  --  134*   < > 111*  BUN 35*  --  41*   < > 63*  CREATININE 2.68*  --  2.76*   < > 3.42*  CALCIUM 9.4  --  8.7*   < > 9.1  MG  --    < > 2.2  --   --   AST 37  --   --   --   --   ALT 34  --   --   --   --   ALKPHOS 116  --   --   --   --   BILITOT 0.8  --   --   --   --    < > =  values in this interval not displayed.   ------------------------------------------------------------------------------------------------------------------  Cardiac Enzymes No results for input(s): TROPONINI in the last 168 hours. ------------------------------------------------------------------------------------------------------------------  RADIOLOGY:  No results found.   ASSESSMENT AND PLAN:   Acute hypoxic respiratory failure secondary to combination of COPD and CHF. Weaned off BiPAP and now on room air.  *Acute on chronic systolic heart failure with EF 30 to 35%.  Patient has left ventricular diffuse hypokinesia, Hold Lasix today patient appears euvolemic, appreciate cardiology input.  *COPD.  Continue nebulizers and  inhalers  *Atrial fibrillation with RVR improved, continue amiodarone, discontinue heparin drip,  AKI in CKD III-IV-c monitor kidney function closely, avoid nephrotoxic agents.  Chronic iron deficiency anemia-hemoglobin at baseline -Continue home ferrous sulfate  Tobacco use -Nicotine patch as needed  Alcohol abuse -CIWA -Thiamine, folate, MVI Patient is ward of state has legal guardian, palliative care consulted.  Adult Protective Services also involved due to concern for care by family including son.  Patient is from family care home, and will return to family care at discharge.  Insomnia patient already on trazodone at bedtime.  Consulted regarding advanced directive and CODE STATUS. All the records are reviewed and case discussed with Care Management/Social Worker Management plans discussed with the patient, family and they are in agreement.  CODE STATUS: Full code More than 50% time spent in counseling, coordination of care TOTAL TIME TAKING CARE OF THIS PATIENT: 35 minutes.   POSSIBLE D/C IN 1-2 DAYS, DEPENDING ON CLINICAL CONDITION.  Epifanio Lesches M.D on 08/27/2018 at 3:07 PM  Between 7am to 6pm - Pager - [phone]  After 6pm go to [email or web address] - password [email or web address] [place] [facility]  Office  [phone]  CC: Primary care physician; Leone Haven, MD  Note: This dictation was prepared with Dragon dictation along with smaller phrase technology. Any transcriptional errors that result from this process are unintentional.

## 2018-08-27 NOTE — Plan of Care (Signed)

## 2018-08-27 NOTE — NC FL2 (Signed)
[place] LEVEL OF CARE SCREENING TOOL     IDENTIFICATION  Patient Name: Justin Andrews Birthdate: 1934/10/12 Sex: male Admission Date (Current Location): 08/23/2018  [facility] and [place] Number:  Engineering geologist and Address:  [facility], [address], [place], [place] [address]      Provider Number: [id number]  Attending Physician Name and Address:  Epifanio Lesches, MD  Relative Name and Phone Number:       Current Level of Care: Hospital Recommended Level of Care: [facility] Prior Approval Number:    Date Approved/Denied:   PASRR Number:    Discharge Plan: Other (Comment)(Family Care Home)    Current Diagnoses: Patient Active Problem List   Diagnosis Date Noted  . Acute pulmonary edema (HCC)   . Community acquired pneumonia of right lower lobe of lung ([place])   . Acute respiratory failure with hypoxia ([place]) 08/23/2018  . Encounter for competency evaluation 07/28/2018  . History of Clostridioides difficile infection 07/17/2018  . Insufficient social support 06/27/2018  . Abdominal pain 05/28/2018  . GI bleed 05/02/2018  . Hemorrhagic shock ([place]) 05/02/2018  . Altered mental status 05/02/2018  . Anemia due to blood loss, acute 05/02/2018  . RLS (restless legs syndrome) 04/22/2018  . Wrist pain 04/22/2018  . Chronic hip pain 04/08/2018  . Suicidal ideation 03/19/2018  . Protein-calorie malnutrition, severe 12/03/2017  . Closed left hip fracture ([place]) 11/30/2017  . Diarrhea 11/15/2017  . Duodenum disorder 11/15/2017  . Frequency of urination 10/21/2017  . Cigarette smoker 10/06/2017  . Cough 09/25/2017  . Changes in vision 09/25/2017  . Proteinuria 09/25/2017  . Basal cell carcinoma 02/28/2017  . 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 DOE (dyspnea on exertion) 01/17/2017  . Chronic right upper quadrant pain 11/15/2016  . Balance problem 11/15/2016  . Simple renal cyst 10/21/2016  . Prostate nodule  10/16/2016  . Dysuria 10/16/2016  . CKD (chronic kidney disease) stage 3, GFR 30-59 ml/min (HCC) 08/05/2016  . Anemia 08/05/2016  . Anxiety and depression 08/05/2016  . Weight loss 08/05/2016  . Sleep disturbance 07/23/2016  . Hypertension   . Osteoarthritis   . Tobacco abuse   . 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[place]) 03/06/2016    Orientation RESPIRATION BLADDER Height & Weight     Self, Situation, Place  Normal Continent Weight: 54.7 kg Height:  5\' 7"  (170.2 cm)  BEHAVIORAL SYMPTOMS/MOOD NEUROLOGICAL BOWEL NUTRITION STATUS      Incontinent Diet(2 gram NA diet)  AMBULATORY STATUS COMMUNICATION OF NEEDS Skin   Limited Assist Verbally Normal                       Personal Care Assistance Level of Assistance  Bathing, Feeding, Dressing Bathing Assistance: Limited assistance Feeding assistance: Independent Dressing Assistance: Limited assistance     Functional Limitations Info             SPECIAL CARE FACTORS FREQUENCY  PT (By licensed PT)     PT Frequency: Home Health 2 times per week              Contractures Contractures Info: Not present    Additional Factors Info  Code Status, Allergies Code Status Info: Full Allergies Info: Aspirin           Current Medications (08/27/2018):  This is the current hospital active medication list Current Facility-Administered Medications  Medication Dose Route Frequency Provider Last Rate Last Dose  .  acetaminophen (TYLENOL) tablet 650 mg  650 mg Oral Q6H PRN Sela Hua, MD   650 mg at 08/25/18 4401   Or  . acetaminophen (TYLENOL) suppository 650 mg  650 mg Rectal Q6H PRN Mayo, Pete Pelt, MD      . amiodarone (PACERONE) tablet 400 mg  400 mg Oral BID Wilhelmina Mcardle, MD   400 mg at 08/27/18 1018  . calcium-vitamin D (OSCAL WITH D) 500-200 MG-UNIT per tablet 1 tablet  1 tablet Oral BID Mayo, Pete Pelt, MD   1 tablet at 08/27/18 1018  . chlorhexidine (PERIDEX) 0.12 % solution 15 mL  15 mL Mouth Rinse BID  Tukov-Yual, Magdalene S, NP   15 mL at 08/27/18 1018  . Chlorhexidine Gluconate Cloth 2 % PADS 6 each  6 each Topical Daily Mayo, Pete Pelt, MD   6 each at 08/27/18 1019  . ferrous sulfate tablet 325 mg  325 mg Oral Daily Mayo, Pete Pelt, MD   325 mg at 08/27/18 1018  . heparin injection 5,000 Units  5,000 Units Subcutaneous Q8H Epifanio Lesches, MD   5,000 Units at 08/27/18 1405  . ipratropium-albuterol (DUONEB) 0.5-2.5 (3) MG/3ML nebulizer solution 3 mL  3 mL Nebulization Q6H Mayo, Pete Pelt, MD   3 mL at 08/27/18 1358  . ipratropium-albuterol (DUONEB) 0.5-2.5 (3) MG/3ML nebulizer solution 3 mL  3 mL Nebulization Q4H PRN Epifanio Lesches, MD   3 mL at 08/25/18 1754  . LORazepam (ATIVAN) tablet 1 mg  1 mg Oral Q4H PRN Wilhelmina Mcardle, MD   1 mg at 08/27/18 1405  . MEDLINE mouth rinse  15 mL Mouth Rinse q12n4p Tukov-Yual, Magdalene S, NP   15 mL at 08/27/18 1247  . metoprolol tartrate (LOPRESSOR) tablet 12.5 mg  12.5 mg Oral BID Wilhelmina Mcardle, MD   12.5 mg at 08/27/18 1018  . mirtazapine (REMERON) tablet 15 mg  15 mg Oral QHS Mayo, Pete Pelt, MD   15 mg at 08/26/18 2049  . [START ON 08/28/2018] multivitamin with minerals tablet 1 tablet  1 tablet Oral Daily Epifanio Lesches, MD      . nicotine (NICODERM CQ - dosed in mg/24 hours) patch 14 mg  14 mg Transdermal Daily PRN Mayo, Pete Pelt, MD      . ondansetron [facility]) injection 4 mg  4 mg Intravenous Q6H PRN Mayo, Pete Pelt, MD      . pantoprazole (PROTONIX) EC tablet 40 mg  40 mg Oral Daily Mayo, Pete Pelt, MD   40 mg at 08/27/18 1018  . polyethylene glycol (MIRALAX / GLYCOLAX) packet 17 g  17 g Oral Daily Tukov-Yual, Magdalene S, NP   17 g at 08/27/18 1018  . prednisoLONE acetate (PRED FORTE) 1 % ophthalmic suspension 1 drop  1 drop Right Eye QID Charlett Nose, RPH   1 drop at 08/27/18 1405  . protein supplement (ENSURE MAX) liquid  11 oz Oral BID Epifanio Lesches, MD      . tamsulosin (FLOMAX) capsule 0.4 mg  0.4 mg Oral  Daily Mayo, Pete Pelt, MD   0.4 mg at 08/26/18 2048  . traMADol (ULTRAM) tablet 50-100 mg  50-100 mg Oral Q12H Epifanio Lesches, MD   100 mg at 08/27/18 1018  . traZODone (DESYREL) tablet 100 mg  100 mg Oral QHS Mayo, Pete Pelt, MD   100 mg at 08/26/18 2048     Discharge Medications: Please see discharge summary for a list of discharge medications.  Relevant Imaging Results:  Relevant Lab Results:   Additional Information SS# [id number]  Shelbie Hutching, RN

## 2018-08-27 NOTE — Progress Notes (Signed)
Initial Nutrition Assessment  DOCUMENTATION CODES:   Underweight  INTERVENTION:   Ensure Max protein supplement BID, each supplement provides 150kcal and 30g of protein.  MVI daily   3g sodium diet  NUTRITION DIAGNOSIS:   Increased nutrient needs related to chronic illness(COPD) as evidenced by increased estimated needs.  GOAL:   Patient will meet greater than or equal to 90% of their needs  MONITOR:   PO intake, Supplement acceptance, Labs, Weight trends, Skin, I & O's  REASON FOR ASSESSMENT:   Other (Comment)(Low BMI)    ASSESSMENT:   84 y.o. male with h/o melanoma, hip fracture s/p repair, s/p exlap and repair of bleeding duodenal ulcer, duodenoplasty and J tube placement on 5/1, hypertension, CHF, depression, COPD, tobacco use, chronic kidney disease III and alcohol abuse admitted with A-fib .  RD working remotely.  Pt is well known to nutrition department and this RD from multiple previous admits. Pt with poor appetite and oral intake at baseline. Pt is also fairly non-compliant with supplements. Pt currently eating 30-100% of meals in hospital. RD will add supplements and MVI to help pt meet his estimated needs. Pt previously with 12lb(10%) weight loss r/t duodenal ulcer but per chart, pt has been weight stable for the past 3 months.   Pt at high risk for malnutrition but unable to diagnose at this time as NFPE cannot be performed.   Medications reviewed and include: oscal w/ D, ferrous sulfate, heparin, mirtazapine, miralax, protonix  Labs reviewed: BUN 63(H), creat 3.42(H) Hgb 9.4(L), Hct 29.3(L)  Unable to complete Nutrition-Focused physical exam at this time.   Diet Order:   Diet Order            Diet Heart Room service appropriate? Yes; Fluid consistency: Thin  Diet effective now             EDUCATION NEEDS:   No education needs have been identified at this time  Skin:  Skin Assessment: Reviewed RN Assessment  Last BM:  8/20- type 6  Height:    Ht Readings from Last 1 Encounters:  08/23/18 5\' 7"  (1.702 m)    Weight:   Wt Readings from Last 1 Encounters:  08/23/18 54.7 kg    Ideal Body Weight:  67 kg  BMI:  Body mass index is 18.89 kg/m.  Estimated Nutritional Needs:   Kcal:  1600-1800kcal/day  Protein:  80-90g/day  Fluid:  >1.4L/day  Koleen Distance MS, RD, LDN Pager #- [phone] Office#- [phone] After Hours Pager: [phone]

## 2018-08-27 NOTE — Progress Notes (Signed)
Progress Note  Patient Name: Justin Andrews Date of Encounter: 08/27/2018  Primary Cardiologist: [facility], Dr. Fletcher Anon  Subjective   No chest pain, shortness of breath, palpitations, or racing HR.  He stated he continues to not be able to sleep well.  "I would feel better if I could just get some sleep."  Inpatient Medications    Scheduled Meds:  amiodarone  400 mg Oral BID   calcium-vitamin D  1 tablet Oral BID   chlorhexidine  15 mL Mouth Rinse BID   Chlorhexidine Gluconate Cloth  6 each Topical Daily   ferrous sulfate  325 mg Oral Daily   heparin injection (subcutaneous)  5,000 Units Subcutaneous Q8H   ipratropium-albuterol  3 mL Nebulization Q6H   mouth rinse  15 mL Mouth Rinse q12n4p   metoprolol tartrate  12.5 mg Oral BID   mirtazapine  15 mg Oral QHS   [START ON 08/28/2018] multivitamin with minerals  1 tablet Oral Daily   pantoprazole  40 mg Oral Daily   polyethylene glycol  17 g Oral Daily   prednisoLONE acetate  1 drop Right Eye QID   Ensure Max Protein  11 oz Oral BID   tamsulosin  0.4 mg Oral Daily   traMADol  50-100 mg Oral Q12H   traZODone  100 mg Oral QHS   Continuous Infusions:  PRN Meds: acetaminophen **OR** acetaminophen, ipratropium-albuterol, LORazepam, nicotine, [DISCONTINUED] ondansetron **OR** ondansetron (ZOFRAN) IV   Vital Signs    Vitals:   08/26/18 1933 08/26/18 1949 08/27/18 0549 08/27/18 1346  BP:  (!) 111/56 (!) 114/52 115/67  Pulse:  74 72 65  Resp:  18 16 18   Temp:  97.9 F (36.6 C) 98 F (36.7 C) 97.9 F (36.6 C)  TempSrc:  Oral Oral Oral  SpO2: 94% 98% 92% 97%  Weight:      Height:        Intake/Output Summary (Last 24 hours) at 08/27/2018 1556 Last data filed at 08/27/2018 1300 Gross per 24 hour  Intake 480 ml  Output 2300 ml  Net -1820 ml   Last 3 Weights 08/23/2018 08/23/2018 08/17/2018  Weight (lbs) 120 lb 9.5 oz 120 lb 125 lb  Weight (kg) 54.7 kg 54.432 kg 56.7 kg      Telemetry    NSR with  earlier noted short runs of atrial tachycardia / PVCs - Personally Reviewed  ECG    No new tracings - Personally Reviewed  Physical Exam   GEN: No acute distress. Elderly and frail male.   Neck: No JVD Cardiac: RRR, no murmurs, rubs, or gallops.  Respiratory:  Diminished breath sounds bilaterally.  GI: Soft, nontender, non-distended  MS: No edema; No deformity. Neuro:  Nonfocal  Psych: Normal affect   Labs    High Sensitivity Troponin:   Recent Labs  Lab 08/23/18 1703 08/23/18 2212  TROPONINIHS 69* 59*      Cardiac EnzymesNo results for input(s): TROPONINI in the last 168 hours. No results for input(s): TROPIPOC in the last 168 hours.   Chemistry Recent Labs  Lab 08/23/18 1703  08/25/18 0750 08/26/18 0431 08/27/18 0518  NA 137   < > 135 137 138  K 4.1   < > 4.1 4.0 4.6  CL 100   < > 100 104 104  CO2 23   < > 25 26 29   GLUCOSE 126*   < > 124* 100* 111*  BUN 35*   < > 63* 68* 63*  CREATININE 2.68*   < >  3.32* 3.44* 3.42*  CALCIUM 9.4   < > 8.6* 8.6* 9.1  PROT 7.2  --   --   --   --   ALBUMIN 3.8  --   --   --   --   AST 37  --   --   --   --   ALT 34  --   --   --   --   ALKPHOS 116  --   --   --   --   BILITOT 0.8  --   --   --   --   GFRNONAA 21*   < > 16* 16* 16*  GFRAA 25*   < > 19* 18* 18*  ANIONGAP 14   < > 10 7 5    < > = values in this interval not displayed.     Hematology Recent Labs  Lab 08/24/18 0333 08/25/18 0750 08/26/18 0431  WBC 4.0 9.1 7.8  RBC 3.25* 3.25* 3.07*  HGB 9.8* 9.9* 9.4*  HCT 30.8* 30.7* 29.3*  MCV 94.8 94.5 95.4  MCH 30.2 30.5 30.6  MCHC 31.8 32.2 32.1  RDW 17.4* 17.4* 17.4*  PLT 228 229 193    BNP Recent Labs  Lab 08/23/18 1703 08/25/18 0750  BNP 1,820.0* 963.0*     DDimer No results for input(s): DDIMER in the last 168 hours.   Radiology    No results found.  Cardiac Studies   Echo  08/26/18  1. The left ventricle has moderate-severely reduced systolic function, with an ejection fraction of 30-35%.  The cavity size was normal. There is mildly increased left ventricular wall thickness. Left ventricular diastolic function could not be evaluated  secondary to atrial fibrillation. Left ventricular diffuse hypokinesis.  2. Left atrial size was mild-moderately dilated.  3. Right atrial size was mildly dilated.  4. The mitral valve is degenerative. Mild thickening of the mitral valve leaflet. Mitral valve regurgitation is moderate to severe by color flow Doppler.  5. The tricuspid valve is grossly normal. Tricuspid valve regurgitation is mild-moderate.  6. The aortic valve was not well visualized. Mild thickening of the aortic valve. Aortic valve regurgitation is mild by color flow Doppler.  7. The aorta is normal unless otherwise noted.  8. The interatrial septum was not well visualized.   Patient Profile     84 y.o. male with a history of paroxysmal atrial fibrillation initially noted on EKG 05/06/2018, recent GI bleed 04/2018, CKD stage III, anemia of chronic disease complicated by acute blood loss anemia, C. difficile colitis, hypertension, alcohol and tobacco abuse, melanoma, and recent fall x2 with 3 total falls in the past 12 months, and who is being followed this admission for paroxysmal atrial fibrillation with RVR.  Assessment & Plan    Paroxysmal atrial fibrillation with RVR - SR. Earlier atrial tachycardia noted on telemetry. --Currently rate controlled while in SR. Continue amiodarone, metoprolol. --Continue heparin for now and as Hgb allows as below. Daily CBC. --Consider risks versus benefits of discharge with [place] (currently [place] held). Recent GIB and history of recent falls in the past 12 months (3 falls) as well as known alcohol use. CHA2DS2VASc score of at least  5 (CHF, HTN, agex2, vascular disease). If restart previous Eliquis / [place], recommend reduced dose Eliquis 2.5mg  BID (previously prescribed Eliquis 5mg  BID) as meets weight, age, and SCr (over 20 yo, weight under 60kg, SCr  over 1.5) requirements for reduced dose.   Acute on chronic congestive heart failure (EF  30-35%) --Not SOB. Euvolemic on exam s/p low dose lasix earlier in admission. No PTA lasix of note.  --Continue to monitor I/O, daily weights.  --EF 30-35%. Defer ischemic workup at this time given no chest pain and current palliative care consult to determine goals of care. --Not on ACE/ARB/Entreso/spironolactone due to renal function. Escalate medical therapy as tolerated and as renal function/BP allow.   Acute on chronic kidney disease, stage III --Baseline creatinine appears around 1.8-2.1. Close monitoring of renal function, daily BMET.   Anemia of chronic disease, recent GIB --Recent GIB as above. Hgb 9.9.  Daily CBC. Transfuse below 8.0 and if drop in Hgb discontinue heparin. Will need to weigh the risks versus benefits of restarting oral anticoagulation at discharge as above.    Elevated troponin -No chest pain.  HS Tn peaked at 69 and suspect supply demand in setting of earlier volume overload and runs of atrial tachycardia. --Defer ischemic workup at this time given no chest pain and current palliative care consult to determine goals of care.  EKG and high-sensitivity troponin trend not consistent with ACS; however, EF 30-35% as above. Further recommendations s/p establishment of goals of care.  Hypertension -BP controlled to somewhat soft. No ACE/ARB/Spiro/Entresto due to soft BP/renal function.  History of alcohol and tobacco abuse - Prior history of 1/5 of whiskey or vodka /day for 2 years. Cessation of tobacco advised, along with continued cessation of alcohol.  Weakness/falls --3 falls in last 12 months with 2 occurring in past month. OAC risks versus benefits TBD as above.   For questions or updates, please contact [place] Please consult [email or web address] for contact info under        Signed, Arvil Chaco, PA-C  08/27/2018, 3:56 PM

## 2018-08-27 NOTE — Progress Notes (Signed)
Daily Progress Note   Patient Name: Justin Andrews       Date: 08/27/2018 DOB: 1934/06/30  Age: 84 y.o. MRN#: [id number] Attending Physician: Justin Lesches, MD Primary Care Physician: Justin Haven, MD Admit Date: 08/23/2018  Reason for Consultation/Follow-up: Establishing goals of care  Subjective: Patient awake, alert, & oriented x3. He is somewhat anxious stating "he was nervous and not in the best mood!" Complains of generalized pain but endorses medications are helping when he takes. Also complaining that he is unable to sleep during the night, and has not been able to have a good night sleep in over 2 days. He states he is ready to get out of the hospital and go on. When I asked where he was going, he pointed out the window towards the sky stating (I hope it is to heaven soon, because I ain't getting any younger and I know my health is bad). He states he has lived a rough life but have prayed and hope that he makes it in. I offered him spiritual support but he declined stating if it comes to that maybe I will.   He states he is not interested in doing much or having PT come back because he isn't strong enough or interested in what they do at this point in his life.   I called and left a voicemail with his guardian Justin Andrews to follow up on our discussion yesterday. MOST form was faxed to be reviewed and signed by her director. We have not received this document back as of yet.   Patient seems to be more at his baseline. Recommendations remain for hospice support at discharge (SNF vs. Group Home) given his co-morbidities, limited treatment options, and poor prognosis.    Length of Stay: 4  Current Medications: Scheduled Meds:  . amiodarone  400 mg Oral BID  . calcium-vitamin D  1  tablet Oral BID  . chlorhexidine  15 mL Mouth Rinse BID  . Chlorhexidine Gluconate Cloth  6 each Topical Daily  . ferrous sulfate  325 mg Oral Daily  . heparin injection (subcutaneous)  5,000 Units Subcutaneous Q8H  . ipratropium-albuterol  3 mL Nebulization Q6H  . mouth rinse  15 mL Mouth Rinse q12n4p  . metoprolol tartrate  12.5 mg Oral BID  . mirtazapine  15 mg  Oral QHS  . pantoprazole  40 mg Oral Daily  . polyethylene glycol  17 g Oral Daily  . prednisoLONE acetate  1 drop Right Eye QID  . tamsulosin  0.4 mg Oral Daily  . traMADol  50-100 mg Oral Q12H  . traZODone  100 mg Oral QHS    Continuous Infusions:   PRN Meds: acetaminophen **OR** acetaminophen, ipratropium-albuterol, LORazepam, nicotine, [DISCONTINUED] ondansetron **OR** ondansetron (ZOFRAN) IV  Physical Exam      -Awake, A&O x3, somewhat anxious -RRR, diminished bases      Vital Signs: BP (!) 114/52 (BP Location: Left Arm)   Pulse 72   Temp 98 F (36.7 C) (Oral)   Resp 16   Ht 5\' 7"  (1.702 m)   Wt 54.7 kg   SpO2 92%   BMI 18.89 kg/m  SpO2: SpO2: 92 % O2 Device: O2 Device: Room Air O2 Flow Rate: O2 Flow Rate (L/min): 2 L/min  Intake/output summary:   Intake/Output Summary (Last 24 hours) at 08/27/2018 1232 Last data filed at 08/27/2018 0600 Gross per 24 hour  Intake 240 ml  Output 2300 ml  Net -2060 ml   LBM: Last BM Date: 08/26/18 Baseline Weight: Weight: 54.4 kg Most recent weight: Weight: 54.7 kg       Palliative Assessment/Data: PPS 30%      Patient Active Problem List   Diagnosis Date Noted  . Acute pulmonary edema (HCC)   . Community acquired pneumonia of right lower lobe of lung ([place])   . Acute respiratory failure with hypoxia ([place]) 08/23/2018  . Encounter for competency evaluation 07/28/2018  . History of Clostridioides difficile infection 07/17/2018  . Insufficient social support 06/27/2018  . Abdominal pain 05/28/2018  . GI bleed 05/02/2018  . Hemorrhagic shock ([place])  05/02/2018  . Altered mental status 05/02/2018  . Anemia due to blood loss, acute 05/02/2018  . RLS (restless legs syndrome) 04/22/2018  . Wrist pain 04/22/2018  . Chronic hip pain 04/08/2018  . Suicidal ideation 03/19/2018  . Protein-calorie malnutrition, severe 12/03/2017  . Closed left hip fracture ([place]) 11/30/2017  . Diarrhea 11/15/2017  . Duodenum disorder 11/15/2017  . Frequency of urination 10/21/2017  . Cigarette smoker 10/06/2017  . Cough 09/25/2017  . Changes in vision 09/25/2017  . Proteinuria 09/25/2017  . Basal cell carcinoma 02/28/2017  . 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 DOE (dyspnea on exertion) 01/17/2017  . Chronic right upper quadrant pain 11/15/2016  . Balance problem 11/15/2016  . Simple renal cyst 10/21/2016  . Prostate nodule 10/16/2016  . Dysuria 10/16/2016  . CKD (chronic kidney disease) stage 3, GFR 30-59 ml/min (HCC) 08/05/2016  . Anemia 08/05/2016  . Anxiety and depression 08/05/2016  . Weight loss 08/05/2016  . Sleep disturbance 07/23/2016  . Hypertension   . Osteoarthritis   . Tobacco abuse   . Malignant melanoma of left ear [facility]) 03/06/2016    Palliative Care Assessment & Plan   Patient Profile: Palliative Care consult requested for goals of care in this 84 y.o. male with multiple medical problems including melanoma, hypertension, depression, tobacco use, chronic kidney disease III, and alcohol abuse. He presented to the ED with shortness of breath which he reported was becoming progressively worse. He also endorsed lower extremity edema. During his ED work up Justin Andrews was found to be tachycardic with heart rate 111 andinnew onset A. Fib.  Labs were significant for creatinine 2.68, BNP 1820, troponin 69, hemoglobin 11.3. Chest x-ray with new diffuse pulmonary edema and  patchy airspace disease at the right lung, suspicious for pneumonia.  Since his admission he has been followed by cardiology.  He has transition from IV amiodarone  to p.o. Patient's heparin infusion has been discontinued and he has also been deemed a poor long-term candidate for anticoagulation.  Continues to have worsening renal function and will consider a poor dialysis candidate due to multiple comorbidities and frailty.  Recommendations/Plan:  Full Code-MOST form with recommendations for DNR faxed to legal guardian on 8/19. Pending response and completion with signature from [place] current plan of care per medical team.   Outpatient hospice recommended at discharge. Will continue to discuss and f/u with guardians regarding their final decisions and wishes.  Goals of Care and Additional Recommendations:  Limitations on Scope of Treatment: Full Scope Treatment  Code Status:    Code Status Orders  (From admission, onward)         Start     Ordered   08/23/18 2132  Full code  Continuous     08/23/18 2131        Code Status History    Date Active Date Inactive Code Status Order ID Comments User Context   05/29/2018 0016 06/17/2018 1637 DNR [id number]  Mayer Camel, NP ED   05/29/2018 0016 05/29/2018 0016 DNR [id number]  Mayer Camel, NP ED   05/02/2018 1427 05/26/2018 2140 DNR [id number]  Vaughan Basta, MD Inpatient   05/02/2018 0804 05/02/2018 1427 DNR [id number]  Delman Kitten, MD ED   03/13/2018 1929 03/14/2018 2030 Full Code [id number]  Nena Polio, MD ED   11/30/2017 1846 12/01/2017 2040 Full Code [id number]  Thornton [place], MD Inpatient   11/30/2017 1601 11/30/2017 1846 Full Code [id number]  Demetrios Loll, MD Inpatient   Advance Care Planning Activity     Prognosis:  Guarded to Poor   Discharge Planning:  To Be Determined  Care plan was discussed with bedside RN.   Thank you for allowing the Palliative Medicine Team to assist in the care of this patient.  Total Time: 25 min.   Greater than 50%  of this time was spent counseling and coordinating care related to the above assessment and plan.  Alda Lea, AGPCNP-BC Palliative Medicine Team   Please contact Palliative Medicine Team phone at [phone] for questions and concerns.

## 2018-08-27 NOTE — Progress Notes (Cosign Needed)
Patient suffers from COPD which impairs their ability to perform daily activities like ambulating in the home.  A rolling walker will not resolve  issue with performing activities of daily living. A wheelchair will allow patient to safely perform daily activities. Patient is not able to propel themselves in the home using a standard weight wheelchair due to weakness and shortness of breath. Patient can self propel in the lightweight wheelchair. Length of need 99. Accessories: elevating leg rests (ELRs), wheel locks, extensions and anti-tippers, seat cushion, and back cushion.

## 2018-08-27 NOTE — Progress Notes (Signed)
PT Cancellation Note  Patient Details Name: Justin Andrews MRN: [id number] DOB: 18-Jun-1934   Cancelled Treatment:    Reason Eval/Treat Not Completed: Other (comment). Consult received and chart reviewed. Upon arrival, pt soiled in bed with BM. RN notified and will clean pt up prior to PT eval. Will re-attempt.   Ivie Maese 08/27/2018, 11:08 AM  Greggory Stallion, PT, DPT [phone]

## 2018-08-27 NOTE — Evaluation (Signed)
Physical Therapy Evaluation Patient Details Name: Justin Andrews MRN: [id number] DOB: 1934-07-24 Today's Date: 08/27/2018   History of Present Illness  Justin Andrews admitted for ARF with hypoxia with complaints of SOB and weakness x 5 days. History includes melanoma, HTN, depression, GERD, and multiple falls (3) in last 12 months.   Clinical Impression  Justin Andrews is a pleasant 84 year old male who was admitted for ARF with hypoxia. Justin Andrews performs bed mobility with mod I and transfers with min assist and RW. Justin Andrews initially refuses therapy, takes heavy coaxing and encouragement for participation. Justin Andrews only agreeable to standing at bedside and keeps saying "I'll do it tomorrow, come back tomorrow." Justin Andrews wishing to watch TV, and doesn't want to participate in therapy. Justin Andrews demonstrates deficits with balance/strength. With limited evaluation, he demonstrated enough strength to stand at bedside and would be able to perform transfer. Long distance ambulation would be appropriate with wheelchair.  Patient suffers from shortness of breath/weakness in B LE which impairs his/her ability to perform daily activities like toileting, feeding, dressing, grooming, bathing in the home. A cane, walker, crutch will not resolve the patient's issue with performing activities of daily living. A lightweight wheelchair is required/recommended and will allow patient to safely perform daily activities.   Patient can safely propel the wheelchair in the home or has a caregiver who can provide assistance.  Would benefit from skilled Justin Andrews to address above deficits and promote optimal return to PLOF. Recommend transition to [place] upon discharge from acute hospitalization.     Follow Up Recommendations Home health Justin Andrews;Supervision/Assistance - 24 hour(return back to family care home)    Equipment Recommendations  Wheelchair (measurements Justin Andrews)    Recommendations for Other Services       Precautions / Restrictions Precautions Precautions:  Fall Restrictions Weight Bearing Restrictions: No      Mobility  Bed Mobility Overal bed mobility: Modified Independent             General bed mobility comments: impulsive during transfer to EOB. Once seated, forward flexed posture with sacral sitting noted. Cues for safety.  Transfers Overall transfer level: Needs assistance Equipment used: Rolling walker (2 wheeled) Transfers: Sit to/from Stand Sit to Stand: Min assist         General transfer comment: 1st attempt Justin Andrews has difficulty with limited buttock clearance. 2nd attempt with repositioning and RW used. Upright posture performed, however Justin Andrews refuses further mobility and request to sit back on bed.  Ambulation/Gait             General Gait Details: Justin Andrews refused-observed functional strength and would be able to ambulate to recliner  Stairs            Wheelchair Mobility    Modified Rankin (Stroke Patients Only)       Balance Overall balance assessment: Needs assistance;History of Falls Sitting-balance support: Feet supported;Single extremity supported Sitting balance-Leahy Scale: Fair     Standing balance support: Bilateral upper extremity supported Standing balance-Leahy Scale: Fair                               Pertinent Vitals/Pain Pain Assessment: Faces Faces Pain Scale: Hurts a little bit Pain Location: L LE Pain Descriptors / Indicators: Headache;Tingling;Sore Pain Intervention(s): Limited activity within patient's tolerance;Repositioned    Home Living Family/patient expects to be discharged to:: Group home                 Additional  Comments: Per chart review, has been in family care home.    Prior Function Level of Independence: Needs assistance         Comments: poor historian. Reports he hasn't ambulated in over 2 weeks, has been essentially "bed bound". Reports prior he was able to ambulate and uses RW regularly.     Hand Dominance         Extremity/Trunk Assessment   Upper Extremity Assessment Upper Extremity Assessment: Overall WFL for tasks assessed    Lower Extremity Assessment Lower Extremity Assessment: Generalized weakness(L LE grossly 3/5; pain limited R LE grossly WNL)       Communication   Communication: HOH  Cognition Arousal/Alertness: Awake/alert Behavior During Therapy: WFL for tasks assessed/performed Overall Cognitive Status: Difficult to assess                                 General Comments: poor historian, uninterested in performing Justin Andrews. Knew the current president      General Comments      Exercises     Assessment/Plan    Justin Andrews Assessment Patient needs continued Justin Andrews services  Justin Andrews Problem List Decreased strength;Decreased activity tolerance;Decreased balance;Decreased mobility;Decreased safety awareness;Decreased cognition;Impaired sensation       Justin Andrews Treatment Interventions Gait training;DME instruction;Therapeutic exercise;Balance training    Justin Andrews Goals (Current goals can be found in the Care Plan section)  Acute Rehab Justin Andrews Goals Patient Stated Goal: to be left alone Justin Andrews Goal Formulation: With patient Time For Goal Achievement: 09/10/18 Potential to Achieve Goals: Good    Frequency Min 2X/week   Barriers to discharge        Co-evaluation               AM-PAC Justin Andrews "6 Clicks" Mobility  Outcome Measure Help needed turning from your back to your side while in a flat bed without using bedrails?: None Help needed moving from lying on your back to sitting on the side of a flat bed without using bedrails?: None Help needed moving to and from a bed to a chair (including a wheelchair)?: A Little Help needed standing up from a chair using your arms (e.g., wheelchair or bedside chair)?: A Little Help needed to walk in hospital room?: A Lot Help needed climbing 3-5 steps with a railing? : Total 6 Click Score: 17    End of Session   Activity Tolerance: Patient tolerated  treatment well Patient left: in bed;with bed alarm set Nurse Communication: Mobility status Justin Andrews Visit Diagnosis: Unsteadiness on feet (R26.81);Repeated falls (R29.6);Muscle weakness (generalized) (M62.81);History of falling (Z91.81);Difficulty in walking, not elsewhere classified (R26.2)    Time: 8144-8185 Justin Andrews Time Calculation (min) (ACUTE ONLY): 16 min   Charges:   Justin Andrews Evaluation $Justin Andrews Eval Low Complexity: Justin Andrews, Justin Andrews, Justin Andrews [phone]   Justin Andrews 08/27/2018, 4:38 PM

## 2018-08-27 NOTE — Progress Notes (Signed)
[place] [facility] [place] NAME: Demosthenes Virnig    MR#:  [id number]  DATE OF BIRTH:  04/21/1934  SUBJECTIVE: Patient has no chest pain or shortness of breath.  CHIEF COMPLAINT:   Chief Complaint  Patient presents with  . Weakness  . Shortness of Breath   Shortness of breath is improved but still present.  On room air today. Poor historian overall REVIEW OF SYSTEMS:    Review of Systems  Constitutional: Positive for malaise/fatigue. Negative for chills and fever.  HENT: Negative for sore throat.   Eyes: Negative for blurred vision, double vision and pain.  Respiratory: Positive for shortness of breath. Negative for cough, hemoptysis and wheezing.   Cardiovascular: Negative for chest pain, palpitations, orthopnea and leg swelling.  Gastrointestinal: Negative for abdominal pain, constipation, diarrhea, heartburn, nausea and vomiting.  Genitourinary: Negative for dysuria and hematuria.  Musculoskeletal: Negative for back pain and joint pain.  Skin: Negative for rash.  Neurological: Negative for sensory change, speech change, focal weakness and headaches.  Endo/Heme/Allergies: Does not bruise/bleed easily.  Psychiatric/Behavioral: Negative for depression. The patient is not nervous/anxious.     DRUG ALLERGIES:   Allergies  Allergen Reactions  . Aspirin Other (See Comments)    bleeding    VITALS:  Blood pressure 115/67, pulse 65, temperature 97.9 F (36.6 C), resp. rate 18, height 5\' 7"  (1.702 m), weight 54.7 kg, SpO2 97 %.  PHYSICAL EXAMINATION:   Physical Exam  GENERAL:  84 y.o.-year-old patient lying in the bed with no acute distress.  EYES: Pupils equal, round, reactive to light and accommodation. No scleral icterus. Extraocular muscles intact.  HEENT: Head atraumatic, normocephalic. Oropharynx and nasopharynx clear.  NECK:  Supple, no jugular venous distention. No thyroid enlargement, no tenderness.  LUNGS: Normal breath sounds  bilaterally, no wheezing, rales, rhonchi. No use of accessory muscles of respiration.  CARDIOVASCULAR: S1, S2 iregular no murmurs, rubs, or gallops.  ABDOMEN: Soft, nontender, nondistended. Bowel sounds present. No organomegaly or mass.  EXTREMITIES: No cyanosis, clubbing or edema b/l.    NEUROLOGIC: Cranial nerves II through XII are intact. No focal Motor or sensory deficits b/l.   PSYCHIATRIC: The patient is alert and awake SKIN: No obvious rash, lesion, or ulcer.   LABORATORY PANEL:   CBC Recent Labs  Lab 08/26/18 0431  WBC 7.8  HGB 9.4*  HCT 29.3*  PLT 193   ------------------------------------------------------------------------------------------------------------------ Chemistries  Recent Labs  Lab 08/23/18 1703  08/24/18 0333  08/27/18 0518  NA 137  --  139   < > 138  K 4.1  --  4.1   < > 4.6  CL 100  --  97*   < > 104  CO2 23  --  24   < > 29  GLUCOSE 126*  --  134*   < > 111*  BUN 35*  --  41*   < > 63*  CREATININE 2.68*  --  2.76*   < > 3.42*  CALCIUM 9.4  --  8.7*   < > 9.1  MG  --    < > 2.2  --   --   AST 37  --   --   --   --   ALT 34  --   --   --   --   ALKPHOS 116  --   --   --   --   BILITOT 0.8  --   --   --   --    < > =  values in this interval not displayed.   ------------------------------------------------------------------------------------------------------------------  Cardiac Enzymes No results for input(s): TROPONINI in the last 168 hours. ------------------------------------------------------------------------------------------------------------------  RADIOLOGY:  No results found.   ASSESSMENT AND PLAN:   Acute hypoxic respiratory failure secondary to combination of COPD and CHF. Weaned off BiPAP and now on room air.  *Acute on chronic systolic heart failure with EF 30 to 35%.  Patient has left ventricular diffuse hypokinesia, Hold Lasix today patient appears euvolemic, appreciate cardiology input.  *COPD.  Continue nebulizers  and inhalers  *Atrial fibrillation with RVR discontinue heparin drip, continue amiodarone cardiology input appreciated. Not a candidate for long-term anticoagulation.  Elevated troponin- likely due to above Stable  AKI in CKD III-IV-c monitor kidney function closely, avoid nephrotoxic agents.  Chronic iron deficiency anemia-hemoglobin at baseline -Continue home ferrous sulfate  Tobacco use -Nicotine patch as needed  Alcohol abuse -CIWA -Thiamine, folate, MVI Patient is out of state has legal guardian, palliative care consulted.  Adult Protective Services also involved due to concern for care by family including son.    Consulted regarding advanced directive and CODE STATUS. All the records are reviewed and case discussed with Care Management/Social Worker Management plans discussed with the patient, family and they are in agreement.  CODE STATUS: Full code More than 50% time spent in counseling, coordination of care TOTAL TIME TAKING CARE OF THIS PATIENT: 35 minutes.   POSSIBLE D/C IN 1-2 DAYS, DEPENDING ON CLINICAL CONDITION.  Epifanio Lesches M.D on 08/27/2018 at 3:04 PM  Between 7am to 6pm - Pager - [phone]  After 6pm go to [email or web address] - password [email or web address] [facility]  Office  [phone]  CC: Primary care physician; Leone Haven, MD  Note: This dictation was prepared with Dragon dictation along with smaller phrase technology. Any transcriptional errors that result from this process are unintentional.

## 2018-08-28 DIAGNOSIS — D631 Anemia in chronic kidney disease: Secondary | ICD-10-CM

## 2018-08-28 DIAGNOSIS — N184 Chronic kidney disease, stage 4 (severe): Secondary | ICD-10-CM

## 2018-08-28 LAB — CULTURE, BLOOD (ROUTINE X 2)
Culture: NO GROWTH
Culture: NO GROWTH
Special Requests: ADEQUATE
Special Requests: ADEQUATE

## 2018-08-28 LAB — BASIC METABOLIC PANEL
Anion gap: 4 — ABNORMAL LOW (ref 5–15)
BUN: 53 mg/dL — ABNORMAL HIGH (ref 8–23)
CO2: 30 mmol/L (ref 22–32)
Calcium: 9.7 mg/dL (ref 8.9–10.3)
Chloride: 103 mmol/L (ref 98–111)
Creatinine, Ser: 3.09 mg/dL — ABNORMAL HIGH (ref 0.61–1.24)
GFR calc Af Amer: 21 mL/min — ABNORMAL LOW (ref >60)
GFR calc non Af Amer: 18 mL/min — ABNORMAL LOW (ref >60)
Glucose, Bld: 133 mg/dL — ABNORMAL HIGH (ref 70–99)
Potassium: 4.9 mmol/L (ref 3.5–5.1)
Sodium: 137 mmol/L (ref 135–145)

## 2018-08-28 MED ORDER — ALBUTEROL SULFATE (2.5 MG/3ML) 0.083% IN NEBU
2.5000 mg | INHALATION_SOLUTION | RESPIRATORY_TRACT | Status: DC | PRN
  Administered 2018-08-28 – 2018-09-01 (×5): 2.5 mg via RESPIRATORY_TRACT
  Filled 2018-08-28 (×5): qty 3

## 2018-08-28 MED ORDER — IPRATROPIUM-ALBUTEROL 0.5-2.5 (3) MG/3ML IN SOLN
3.0000 mL | Freq: Three times a day (TID) | RESPIRATORY_TRACT | Status: DC
  Administered 2018-08-28 – 2018-09-04 (×22): 3 mL via RESPIRATORY_TRACT
  Filled 2018-08-28 (×22): qty 3

## 2018-08-28 NOTE — Care Management Important Message (Signed)
Important Message  Patient Details  Name: Justin Andrews MRN: [id number] Date of Birth: [birth year]   Medicare Important Message Given:  Yes     [name] 08/28/2018, 11:09 AM

## 2018-08-28 NOTE — Progress Notes (Signed)
Progress Note  Patient Name: Justin Andrews Date of Encounter: 08/28/2018  Primary Cardiologist: [facility], Dr. Fletcher Anon  Subjective   Denied chest pain but did note tightness due to SOB. Stated he needs his breathing treatment. Stated, "I don't need someone for my heart, I need someone for my breathing."  Reported palpitations without ectopy seen on telemetry. Telemetry leads have stopped transmitting to the telemetry monitor and are currently being replaced during cardiac evaluation this AM.  Reported ongoing SOB.   Inpatient Medications    Scheduled Meds: . amiodarone  400 mg Oral BID  . calcium-vitamin D  1 tablet Oral BID  . chlorhexidine  15 mL Mouth Rinse BID  . Chlorhexidine Gluconate Cloth  6 each Topical Daily  . ferrous sulfate  325 mg Oral Daily  . heparin injection (subcutaneous)  5,000 Units Subcutaneous Q8H  . ipratropium-albuterol  3 mL Nebulization TID  . mouth rinse  15 mL Mouth Rinse q12n4p  . metoprolol tartrate  12.5 mg Oral BID  . mirtazapine  15 mg Oral QHS  . multivitamin with minerals  1 tablet Oral Daily  . pantoprazole  40 mg Oral Daily  . polyethylene glycol  17 g Oral Daily  . prednisoLONE acetate  1 drop Right Eye QID  . Ensure Max Protein  11 oz Oral BID  . tamsulosin  0.4 mg Oral Daily  . traMADol  50-100 mg Oral Q12H  . traZODone  100 mg Oral QHS   Continuous Infusions:  PRN Meds: acetaminophen **OR** acetaminophen, albuterol, LORazepam, nicotine, [DISCONTINUED] ondansetron **OR** ondansetron (ZOFRAN) IV   Vital Signs    Vitals:   08/27/18 2026 08/27/18 2037 08/28/18 0450 08/28/18 0806  BP:  121/78 134/78   Pulse:  79 67 65  Resp:  18 16 16   Temp:  98.1 F (36.7 C) (!) 97.5 F (36.4 C)   TempSrc:  Oral Oral   SpO2: 94% 99% 94% 93%  Weight:      Height:        Intake/Output Summary (Last 24 hours) at 08/28/2018 1153 Last data filed at 08/28/2018 1019 Gross per 24 hour  Intake 600 ml  Output -  Net 600 ml   Last 3 Weights  08/23/2018 08/23/2018 08/17/2018  Weight (lbs) 120 lb 9.5 oz 120 lb 125 lb  Weight (kg) 54.7 kg 54.432 kg 56.7 kg      Telemetry    NSR with leads being replaced, earlier noted PVCs in admission- Personally Reviewed  ECG    No new tracings - Personally Reviewed  Physical Exam   GEN: No acute distress. Elderly and frail male.   Neck: No JVD Cardiac: RRR, no murmurs, rubs, or gallops.  Respiratory:  Diminished breath sounds bilaterally. Expiratory wheeze GI: Soft, nontender, non-distended  MS: No edema; No deformity. Neuro:  Nonfocal  Psych: Normal affect   Labs    High Sensitivity Troponin:   Recent Labs  Lab 08/23/18 1703 08/23/18 2212  TROPONINIHS 69* 59*      Cardiac EnzymesNo results for input(s): TROPONINI in the last 168 hours. No results for input(s): TROPIPOC in the last 168 hours.   Chemistry Recent Labs  Lab 08/23/18 1703  08/25/18 0750 08/26/18 0431 08/27/18 0518  NA 137   < > 135 137 138  K 4.1   < > 4.1 4.0 4.6  CL 100   < > 100 104 104  CO2 23   < > 25 26 29   GLUCOSE 126*   < >  124* 100* 111*  BUN 35*   < > 63* 68* 63*  CREATININE 2.68*   < > 3.32* 3.44* 3.42*  CALCIUM 9.4   < > 8.6* 8.6* 9.1  PROT 7.2  --   --   --   --   ALBUMIN 3.8  --   --   --   --   AST 37  --   --   --   --   ALT 34  --   --   --   --   ALKPHOS 116  --   --   --   --   BILITOT 0.8  --   --   --   --   GFRNONAA 21*   < > 16* 16* 16*  GFRAA 25*   < > 19* 18* 18*  ANIONGAP 14   < > 10 7 5    < > = values in this interval not displayed.     Hematology Recent Labs  Lab 08/24/18 0333 08/25/18 0750 08/26/18 0431  WBC 4.0 9.1 7.8  RBC 3.25* 3.25* 3.07*  HGB 9.8* 9.9* 9.4*  HCT 30.8* 30.7* 29.3*  MCV 94.8 94.5 95.4  MCH 30.2 30.5 30.6  MCHC 31.8 32.2 32.1  RDW 17.4* 17.4* 17.4*  PLT 228 229 193    BNP Recent Labs  Lab 08/23/18 1703 08/25/18 0750  BNP 1,820.0* 963.0*     DDimer No results for input(s): DDIMER in the last 168 hours.   Radiology    No  results found.  Cardiac Studies   Echo  08/26/18  1. The left ventricle has moderate-severely reduced systolic function, with an ejection fraction of 30-35%. The cavity size was normal. There is mildly increased left ventricular wall thickness. Left ventricular diastolic function could not be evaluated  secondary to atrial fibrillation. Left ventricular diffuse hypokinesis.  2. Left atrial size was mild-moderately dilated.  3. Right atrial size was mildly dilated.  4. The mitral valve is degenerative. Mild thickening of the mitral valve leaflet. Mitral valve regurgitation is moderate to severe by color flow Doppler.  5. The tricuspid valve is grossly normal. Tricuspid valve regurgitation is mild-moderate.  6. The aortic valve was not well visualized. Mild thickening of the aortic valve. Aortic valve regurgitation is mild by color flow Doppler.  7. The aorta is normal unless otherwise noted.  8. The interatrial septum was not well visualized.   Patient Profile     84 y.o. male with a history of paroxysmal atrial fibrillation initially noted on EKG 05/06/2018, recent GI bleed 04/2018, CKD stage III, anemia of chronic disease complicated by acute blood loss anemia, C. difficile colitis, hypertension, alcohol and tobacco abuse, melanoma, and recent fall x2 with 3 total falls in the past 12 months, and who is being followed this admission for paroxysmal atrial fibrillation with RVR.  Assessment & Plan    Paroxysmal atrial fibrillation with RVR - SR. Earlier atrial tachycardia noted on telemetry. --Currently rate controlled while in SR.  --Continue metoprolol and titrate as needed for HR/BP control. --Continue amiodarone 400mg  BID for 4 days then down to 200mg  twice daily until time of follow-up with cardiology.  --No recommendation for discharge with [facility] after careful consideration of risks versus benefits. Recent GIB and history of recent falls in the past 12 months (3 falls) as well as known  alcohol use. CHA2DS2VASc score of at least  5 (CHF, HTN, agex2, vascular disease).  Acute on chronic congestive heart failure (EF  30-35%) --Euvolemic on exam after earlier low dose lasix.  --Does report SOB at this time in the setting of needing breathing treatment.  --Continue to monitor I/O, daily weights.  --EF 30-35%. No need for ischemic workup at this time given no chest pain and Tn elevation not consistent with ACS. Could consider future workup as an outpatient; however, also consider palliative care consult in place and overall prognosis poor considering multiple comorbid conditions. --Not on ACE/ARB/Entreso/spironolactone due to renal function. Escalate medical therapy as tolerated and as renal function/BP allow.   Acute on chronic kidney disease, stage III --Baseline creatinine appears around 1.8-2.1. Close monitoring of renal function, daily BMET.   Anemia of chronic disease, recent GIB --Recent GIB as above. Daily CBC. Transfuse below 8.0. --As below, no recommendation for discharge on [place].  Elevated troponin -No chest pain.  HS Tn peaked at 69 and suspect supply demand in setting of earlier volume overload and runs of atrial tachycardia. --Defer ischemic workup given no chest pain, and as EKG and high-sensitivity troponin trend not consistent with ACS. Remainder as above.  Hypertension -BP labile and currently SBP 134. Continue metoprolol and titrate as HR and BP allow. No ACE/ARB/Spiro/Entresto due to soft BP/renal function.   History of alcohol and tobacco abuse - Prior history of 1/5 of whiskey or vodka /day for 2 years. Cessation of tobacco advised, along with continued cessation of alcohol.  Weakness/falls --3 falls in last 12 months with 2 occurring in past month. OAC risks versus benefits TBD as above.   For questions or updates, please contact [place] Please consult [email or web address] for contact info under        Signed, Arvil Chaco, PA-C   08/28/2018, 11:53 AM

## 2018-08-28 NOTE — Progress Notes (Signed)
Daily Progress Note   Patient Name: Justin Andrews       Date: 08/28/2018 DOB: 1934-09-08  Age: 84 y.o. MRN#: [id number] Attending Physician: Epifanio Lesches, MD Primary Care Physician: Leone Haven, MD Admit Date: 08/23/2018  Reason for Consultation/Follow-up: Establishing goals of care  Subjective: Awake and alert. Reports he slept a little better yesterday and last night. Denied pain, some shortness of breath. States "when are yall going to let me out of here or am I going to just have to die here!"   Did not hear back from legal guardian on yesterday regarding requested MOST form and it's completion. Voicemail was left. I attempted to leave a voicemail today, however her voicemail is now full. Voicemail left with Francene Boyers with contact information left.   Again, patient seems to be more at his baseline. Recommendations remain for hospice support at discharge (SNF vs. Group Home) given his co-morbidities, limited treatment options, and poor prognosis.  He was seen and evaluated by PT with recommendations for home health PT and equipment. If guardian chooses PT patient would need outpatient Palliative at minimum, however, hospice support remains the preferred recommendation given his co-morbidities and poor prognosis. PT will only minimally provide support in regards to his ability to perform ADLs. Patient remains high risk of decline, recurrent hospitalizations, and death due to his chronic illness and overall functional state.   Length of Stay: 5  Current Medications: Scheduled Meds:  . amiodarone  400 mg Oral BID  . calcium-vitamin D  1 tablet Oral BID  . chlorhexidine  15 mL Mouth Rinse BID  . Chlorhexidine Gluconate Cloth  6 each Topical Daily  . ferrous sulfate   325 mg Oral Daily  . heparin injection (subcutaneous)  5,000 Units Subcutaneous Q8H  . ipratropium-albuterol  3 mL Nebulization TID  . mouth rinse  15 mL Mouth Rinse q12n4p  . metoprolol tartrate  12.5 mg Oral BID  . mirtazapine  15 mg Oral QHS  . multivitamin with minerals  1 tablet Oral Daily  . pantoprazole  40 mg Oral Daily  . polyethylene glycol  17 g Oral Daily  . prednisoLONE acetate  1 drop Right Eye QID  . Ensure Max Protein  11 oz Oral BID  . tamsulosin  0.4 mg Oral Daily  . traMADol  50-100 mg Oral Q12H  . traZODone  100 mg Oral QHS    Continuous Infusions:   PRN Meds: acetaminophen **OR** acetaminophen, albuterol, LORazepam, nicotine, [DISCONTINUED] ondansetron **OR** ondansetron (ZOFRAN) IV  Physical Exam      -awake, A&O x3, flat affect,  -RRR, diminished bases, shortness of breath @2L /[place]     Vital Signs: BP 134/78 (BP Location: Right Arm)   Pulse 65   Temp (!) 97.5 F (36.4 C) (Oral)   Resp 16   Ht 5\' 7"  (1.702 m)   Wt 54.7 kg   SpO2 93%   BMI 18.89 kg/m  SpO2: SpO2: 93 % O2 Device: O2 Device: Room Air O2 Flow Rate: O2 Flow Rate (L/min): 2 L/min  Intake/output summary:   Intake/Output Summary (Last 24 hours) at 08/28/2018 1213 Last data filed at 08/28/2018 1019 Gross per 24 hour  Intake 600 ml  Output -  Net 600 ml   LBM: Last BM Date: 08/28/18 Baseline Weight: Weight: 54.4 kg Most recent weight: Weight: 54.7 kg       Palliative Assessment/Data: PPS 30%      Patient Active Problem List   Diagnosis Date Noted  . Paroxysmal atrial fibrillation (HCC)   . Acute pulmonary edema (HCC)   . Community acquired pneumonia of right lower lobe of lung ([place])   . Acute respiratory failure with hypoxia ([place]) 08/23/2018  . Encounter for competency evaluation 07/28/2018  . History of Clostridioides difficile infection 07/17/2018  . Insufficient social support 06/27/2018  . Abdominal pain 05/28/2018  . GI bleed 05/02/2018  . Hemorrhagic shock ([place])  05/02/2018  . Altered mental status 05/02/2018  . Anemia due to blood loss, acute 05/02/2018  . RLS (restless legs syndrome) 04/22/2018  . Wrist pain 04/22/2018  . Chronic hip pain 04/08/2018  . Suicidal ideation 03/19/2018  . Protein-calorie malnutrition, severe 12/03/2017  . Closed left hip fracture ([place]) 11/30/2017  . Diarrhea 11/15/2017  . Duodenum disorder 11/15/2017  . Frequency of urination 10/21/2017  . Cigarette smoker 10/06/2017  . Cough 09/25/2017  . Changes in vision 09/25/2017  . Proteinuria 09/25/2017  . Basal cell carcinoma 02/28/2017  . 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 DOE (dyspnea on exertion) 01/17/2017  . Chronic right upper quadrant pain 11/15/2016  . Balance problem 11/15/2016  . Simple renal cyst 10/21/2016  . Prostate nodule 10/16/2016  . Dysuria 10/16/2016  . CKD (chronic kidney disease) stage 3, GFR 30-59 ml/min (HCC) 08/05/2016  . Anemia 08/05/2016  . Anxiety and depression 08/05/2016  . Weight loss 08/05/2016  . Sleep disturbance 07/23/2016  . Hypertension   . Osteoarthritis   . Tobacco abuse   . Malignant melanoma of left ear [facility]) 03/06/2016    Palliative Care Assessment & Plan   Patient Profile: Palliative Care consult requested for goals of care in this 84 y.o. male with multiple medical problems including melanoma, hypertension, depression, tobacco use, chronic kidney disease III, and alcohol abuse. He presented to the ED with shortness of breath which he reported was becoming progressively worse. He also endorsed lower extremity edema. During his ED work up Mr. Branton was found to be tachycardic with heart rate 111 andinnew onset A. Fib.  Labs were significant for creatinine 2.68, BNP 1820, troponin 69, hemoglobin 11.3. Chest x-ray with new diffuse pulmonary edema and patchy airspace disease at the right lung, suspicious for pneumonia.  Since his admission he has been followed by cardiology.  He has transition from IV amiodarone  to p.o. Patient's  heparin infusion has been discontinued and he has also been deemed a poor long-term candidate for anticoagulation.  Continues to have worsening renal function and will consider a poor dialysis candidate due to multiple comorbidities and frailty.  Recommendations/Plan:  Full Code-MOST form with recommendations for DNR faxed to legal guardian on 8/19. Pending response and completion with signature from [place] current plan of care per medical team.   Outpatient hospice recommended at discharge. Multiple attempts to follow back up with legal guardian for decisions and further goals of care.   If patient discharges back to group home with home health/PT recommend outpatient palliative at minimum.   PMT will continue to support and follow as needed.   Goals of Care and Additional Recommendations:  Limitations on Scope of Treatment: Full Scope Treatment  Code Status:    Code Status Orders  (From admission, onward)         Start     Ordered   08/23/18 2132  Full code  Continuous     08/23/18 2131        Code Status History    Date Active Date Inactive Code Status Order ID Comments User Context   05/29/2018 0016 06/17/2018 1637 DNR [id number]  Mayer Camel, NP ED   05/29/2018 0016 05/29/2018 0016 DNR [id number]  Mayer Camel, NP ED   05/02/2018 1427 05/26/2018 2140 DNR [id number]  Vaughan Basta, MD Inpatient   05/02/2018 0804 05/02/2018 1427 DNR [id number]  Delman Kitten, MD ED   03/13/2018 1929 03/14/2018 2030 Full Code [id number]  Nena Polio, MD ED   11/30/2017 1846 12/01/2017 2040 Full Code [id number]  Thornton Park, MD Inpatient   11/30/2017 1601 11/30/2017 1846 Full Code [id number]  Demetrios Loll, MD Inpatient   Advance Care Planning Activity     Prognosis:  Guarded to Poor   Discharge Planning:  To Be Determined  Care plan was discussed with bedside RN.   Thank you for allowing the Palliative Medicine Team to assist in the care of  this patient.  Total Time: 15 min.   Greater than 50%  of this time was spent counseling and coordinating care related to the above assessment and plan.  Alda Lea, AGPCNP-BC Palliative Medicine Team   Please contact Palliative Medicine Team phone at [phone] for questions and concerns.

## 2018-08-28 NOTE — Progress Notes (Signed)
[place] at [place] NAME: Justin Andrews    MR#:  [id number]  DATE OF BIRTH:  1934-08-03  SUBJECTIVE: Seen at bedside, patient is feeling less anxious.  CHIEF COMPLAINT:   Chief Complaint  Patient presents with  . Weakness  . Shortness of Breath    REVIEW OF SYSTEMS:    Review of Systems  Constitutional: Negative for chills, fever and malaise/fatigue.  HENT: Negative for sore throat.   Eyes: Negative for blurred vision, double vision and pain.  Respiratory: Negative for cough, hemoptysis, shortness of breath and wheezing.   Cardiovascular: Negative for chest pain, palpitations, orthopnea and leg swelling.  Gastrointestinal: Negative for abdominal pain, constipation, diarrhea, heartburn, nausea and vomiting.  Genitourinary: Negative for dysuria and hematuria.  Musculoskeletal: Negative for back pain and joint pain.  Skin: Negative for rash.  Neurological: Negative for sensory change, speech change, focal weakness and headaches.  Endo/Heme/Allergies: Does not bruise/bleed easily.  Psychiatric/Behavioral: Positive for memory loss. Negative for depression. The patient has insomnia.     DRUG ALLERGIES:   Allergies  Allergen Reactions  . Aspirin Other (See Comments)    bleeding    VITALS:  Blood pressure 134/78, pulse 65, temperature (!) 97.5 F (36.4 C), temperature source Oral, resp. rate 16, height 5\' 7"  (1.702 m), weight 54.7 kg, SpO2 93 %.  PHYSICAL EXAMINATION:   Physical Exam  GENERAL:  84 y.o.-year-old patient lying in the bed with no acute distress.  Cachectic EYES: Pupils equal, round, reactive to light and accommodation. No scleral icterus. Extraocular muscles intact.  HEENT: Head atraumatic, normocephalic. Oropharynx and nasopharynx clear.  NECK:  Supple, no jugular venous distention. No thyroid enlargement, no tenderness.  LUNGS: Normal breath sounds bilaterally, no wheezing, rales, rhonchi. No use of accessory muscles  of respiration.  CARDIOVASCULAR: S1, S2 iregular no murmurs, rubs, or gallops.  ABDOMEN: Soft, nontender, nondistended. Bowel sounds present. No organomegaly or mass.  EXTREMITIES: No cyanosis, clubbing or edema b/l.    NEUROLOGIC: Cranial nerves II through XII are intact. No focal Motor or sensory deficits b/l.   PSYCHIATRIC: The patient is alert and awake has anxiety SKIN: No obvious rash, lesion, or ulcer.   LABORATORY PANEL:   CBC Recent Labs  Lab 08/26/18 0431  WBC 7.8  HGB 9.4*  HCT 29.3*  PLT 193   ------------------------------------------------------------------------------------------------------------------ Chemistries  Recent Labs  Lab 08/23/18 1703  08/24/18 0333  08/28/18 1211  NA 137  --  139   < > 137  K 4.1  --  4.1   < > 4.9  CL 100  --  97*   < > 103  CO2 23  --  24   < > 30  GLUCOSE 126*  --  134*   < > 133*  BUN 35*  --  41*   < > 53*  CREATININE 2.68*  --  2.76*   < > 3.09*  CALCIUM 9.4  --  8.7*   < > 9.7  MG  --    < > 2.2  --   --   AST 37  --   --   --   --   ALT 34  --   --   --   --   ALKPHOS 116  --   --   --   --   BILITOT 0.8  --   --   --   --    < > = values in this  interval not displayed.   ------------------------------------------------------------------------------------------------------------------  Cardiac Enzymes No results for input(s): TROPONINI in the last 168 hours. ------------------------------------------------------------------------------------------------------------------  RADIOLOGY:  No results found.   ASSESSMENT AND PLAN:   Acute hypoxic respiratory failure secondary to combination of COPD and CHF. Weaned off BiPAP and now on room air.  *Acute on chronic systolic heart failure with EF 30 to 35%.  Patient has left ventricular diffuse hypokinesia, Hold Lasix today patient appears euvolemic, appreciate cardiology input.  *COPD.  Continue nebulizers and inhalers  Paroxysmal*Atrial fibrillation with RVR  improved, continue amiodarone, discontinue heparin drip, no recommendation for oral anticoagulants as per cardiology  AKI in CKD III-IV-c monitor kidney function closely, avoid nephrotoxic agents.  Creatinine is coming down to 3, baseline is around 1.9.  Likely acute kidney injury due to diuretics, patient is monitoring in the hospital at least couple more days before he is being discharged back to his  family care home  Chronic iron deficiency anemia-hemoglobin at baseline -Continue home ferrous sulfate  Tobacco use -Nicotine patch as needed  Alcohol abuse -CIWA -Thiamine, folate, MVI Patient is ward of state has legal guardian, palliative care consulted.  Adult Protective Services also involved due to concern for care by family including son.  Patient is from family care home, and will return to family care at discharge.  Insomnia patient already on trazodone at bedtime.  Consulted regarding advanced directive and CODE STATUS. All the records are reviewed and case discussed with Care Management/Social Worker Management plans discussed with the patient, family and they are in agreement.  CODE STATUS: Full code More than 50% time spent in counseling, coordination of care TOTAL TIME TAKING CARE OF THIS PATIENT: 35 minutes.   POSSIBLE D/C IN 1-2 DAYS, DEPENDING ON CLINICAL CONDITION.  Justin Andrews M.D on 08/28/2018 at 1:03 PM  Between 7am to 6pm - Pager - [phone]  After 6pm go to [email or web address] - password [email or web address] [facility]  Office  [phone]  CC: Primary care physician; Justin Haven, MD  Note: This dictation was prepared with Dragon dictation along with smaller phrase technology. Any transcriptional errors that result from this process are unintentional.

## 2018-08-29 LAB — BASIC METABOLIC PANEL
Anion gap: 9 (ref 5–15)
BUN: 50 mg/dL — ABNORMAL HIGH (ref 8–23)
CO2: 27 mmol/L (ref 22–32)
Calcium: 9.7 mg/dL (ref 8.9–10.3)
Chloride: 104 mmol/L (ref 98–111)
Creatinine, Ser: 2.88 mg/dL — ABNORMAL HIGH (ref 0.61–1.24)
GFR calc Af Amer: 23 mL/min — ABNORMAL LOW (ref >60)
GFR calc non Af Amer: 19 mL/min — ABNORMAL LOW (ref >60)
Glucose, Bld: 109 mg/dL — ABNORMAL HIGH (ref 70–99)
Potassium: 5 mmol/L (ref 3.5–5.1)
Sodium: 140 mmol/L (ref 135–145)

## 2018-08-29 LAB — CBC
HCT: 33.4 % — ABNORMAL LOW (ref 39.0–52.0)
Hemoglobin: 10.3 g/dL — ABNORMAL LOW (ref 13.0–17.0)
MCH: 30 pg (ref 26.0–34.0)
MCHC: 30.8 g/dL (ref 30.0–36.0)
MCV: 97.4 fL (ref 80.0–100.0)
Platelets: 197 10*3/uL (ref 150–400)
RBC: 3.43 MIL/uL — ABNORMAL LOW (ref 4.22–5.81)
RDW: 16.7 % — ABNORMAL HIGH (ref 11.5–15.5)
WBC: 5.6 10*3/uL (ref 4.0–10.5)
nRBC: 0 % (ref 0.0–0.2)

## 2018-08-29 NOTE — TOC Progression Note (Addendum)
Transition of Care [facility]) - Progression Note    Patient Details  Name: Justin Andrews MRN: [id number] Date of Birth: 1934-09-08  [facility] CM/SW Contact  Marshell Garfinkel, RN Phone Number: 08/29/2018, 8:32 AM  Clinical Narrative:    Seeking guardian Jerene Pitch [phone] for authorization for this patient to receive hospice services at group home. Her mailbox is full. I left message for patient's son Garciagarcia [phone] in hopes he can assist. Message also left for Lavada Mesi group home owner [phone] for assistance with contacting guardian and to see if she can take him back with hospice pending however that would impact wheelchair authorization. RNCM will follow. Update at 0847: RNCM left message for [employer]PS supervisor [phone] to call back. Update at 0909: callback from [facility] with [facility]PS. Callback from Optometrist with [employer]PS stating that PATIENT IS UNDER THEIR LEGAL GUARDIANSHIP HOWEVER BECAUSE IT IS A PROTECTIVE ORDER THEY ARE NOT LEGALLY ALLOWED TO MAKE DNR AND END OF LIFE DECISIONS. They have asked that patient stay over weekend so they can seek order for this decision from magistrate office Monday 08/31/18.    Expected Discharge Plan: Group Home Barriers to Discharge: Continued Medical Work up  Expected Discharge Plan and Services Expected Discharge Plan: Group Home     Post Acute Care Choice: Durable Medical Equipment Living arrangements for the past 2 months: Group Home                 DME Arranged: Youth worker wheelchair with seat cushion DME Agency: [employer] Date DME Agency Contacted: 08/27/18 Time DME Agency Contacted: [phone] Representative spoke with at DME Agency: Emailed Sunday Corn             Social Determinants of Health ([place]) Interventions    Readmission Risk Interventions Readmission Risk Prevention Plan 05/07/2018  Transportation Screening Complete  PCP or Specialist Appt within 3-5 Days Complete  HRI or Home Care Consult Complete   Medication Review (RN Care Manager) Complete  Some recent data might be hidden

## 2018-08-29 NOTE — Progress Notes (Signed)
Physical Therapy Treatment Patient Details Name: Justin Andrews MRN: [id number] DOB: 1934/11/29 Today's Date: 08/29/2018    History of Present Illness Pt admitted for ARF with hypoxia with complaints of SOB and weakness x 5 days. History includes melanoma, HTN, depression, GERD, and multiple falls (3) in last 12 months.     PT Comments    Pt in bed, stating he is feeling anxious.  Wanting to get up.  Pt had taken O2 off recently, and was assisted and replacing.  To edge of bed with ease. Stood and transferred to recliner at bedside with walker and min guard.  Overall steady but should have +1 for safety due to O2 management and safety.  Once in chair pt stated he felt better and was happy to be up.  RN in and stated he had reported some chest pain previously but it was felt to be anxiety related and meds were given.  OK to remain in recliner.  Chair alarm set.   Follow Up Recommendations  Home health PT;Supervision/Assistance - 24 hour     Equipment Recommendations  Wheelchair (measurements PT);Rolling walker with 5" wheels;3in1 (PT)    Recommendations for Other Services       Precautions / Restrictions Precautions Precautions: Fall Restrictions Weight Bearing Restrictions: No    Mobility  Bed Mobility Overal bed mobility: Modified Independent                Transfers Overall transfer level: Needs assistance Equipment used: Rolling walker (2 wheeled) Transfers: Sit to/from Stand Sit to Stand: Min guard            Ambulation/Gait Ambulation/Gait assistance: Min guard;Min assist Gait Distance (Feet): 3 Feet Assistive device: Rolling walker (2 wheeled) Gait Pattern/deviations: Step-to pattern     General Gait Details: to chair - limited by some chest pain which is felt to be anxiety induced.   Stairs             Wheelchair Mobility    Modified Rankin (Stroke Patients Only)       Balance Overall balance assessment: Needs assistance;History of  Falls Sitting-balance support: Feet supported;Single extremity supported Sitting balance-Leahy Scale: Fair     Standing balance support: Bilateral upper extremity supported Standing balance-Leahy Scale: Fair                              Cognition Arousal/Alertness: Awake/alert Behavior During Therapy: WFL for tasks assessed/performed Overall Cognitive Status: Within Functional Limits for tasks assessed                                        Exercises      General Comments        Pertinent Vitals/Pain Pain Assessment: No/denies pain    Home Living                      Prior Function            PT Goals (current goals can now be found in the care plan section) Progress towards PT goals: Progressing toward goals    Frequency    Min 2X/week      PT Plan Current plan remains appropriate    Co-evaluation              AM-PAC PT "6 Clicks" Mobility   Outcome Measure  Help needed turning from your back to your side while in a flat bed without using bedrails?: None Help needed moving from lying on your back to sitting on the side of a flat bed without using bedrails?: None Help needed moving to and from a bed to a chair (including a wheelchair)?: A Little Help needed standing up from a chair using your arms (e.g., wheelchair or bedside chair)?: A Little Help needed to walk in hospital room?: A Little Help needed climbing 3-5 steps with a railing? : A Little 6 Click Score: 20    End of Session Equipment Utilized During Treatment: Gait belt;Oxygen Activity Tolerance: Patient tolerated treatment well Patient left: in chair;with call bell/phone within reach;with chair alarm set Nurse Communication: Other (comment)       Time: 5361-4431 PT Time Calculation (min) (ACUTE ONLY): 16 min  Charges:  $Therapeutic Activity: 8-22 mins                     Chesley Noon, PTA 08/29/18, 12:25 PM

## 2018-08-29 NOTE — Progress Notes (Addendum)
[place] at [place] NAME: Justin Andrews    MR#:  [id number]  DATE OF BIRTH:  May 25, 1934  SUBJECTIVE: Seen at bedside, patient is feeling less anxious.  CHIEF COMPLAINT:   Chief Complaint  Patient presents with  . Weakness  . Shortness of Breath  Has generalized weakness Comfortable on oxygen by nasal cannula No fever No chest pain  REVIEW OF SYSTEMS:    Review of Systems  Constitutional: Negative for chills, fever and malaise/fatigue.  HENT: Negative for sore throat.   Eyes: Negative for blurred vision, double vision and pain.  Respiratory: Negative for cough, hemoptysis, shortness of breath and wheezing.   Cardiovascular: Negative for chest pain, palpitations, orthopnea and leg swelling.  Gastrointestinal: Negative for abdominal pain, constipation, diarrhea, heartburn, nausea and vomiting.  Genitourinary: Negative for dysuria and hematuria.  Musculoskeletal: Negative for back pain and joint pain.  Skin: Negative for rash.  Neurological: Negative for sensory change, speech change, focal weakness and headaches.  Endo/Heme/Allergies: Does not bruise/bleed easily.  Psychiatric/Behavioral: Positive for memory loss. Negative for depression. The patient has insomnia.     DRUG ALLERGIES:   Allergies  Allergen Reactions  . Aspirin Other (See Comments)    bleeding    VITALS:  Blood pressure (!) 96/41, pulse 63, temperature 97.7 F (36.5 C), temperature source Oral, resp. rate 14, height 5\' 7"  (1.702 m), weight 54.7 kg, SpO2 98 %.  PHYSICAL EXAMINATION:   Physical Exam  GENERAL:  84 y.o.-year-old patient lying in the bed with no acute distress.  Cachectic EYES: Pupils equal, round, reactive to light and accommodation. No scleral icterus. Extraocular muscles intact.  HEENT: Head atraumatic, normocephalic. Oropharynx and nasopharynx clear.  NECK:  Supple, no jugular venous distention. No thyroid enlargement, no tenderness.  LUNGS:  Normal breath sounds bilaterally, no wheezing, rales, rhonchi. No use of accessory muscles of respiration.  CARDIOVASCULAR: S1, S2 iregular no murmurs, rubs, or gallops.  ABDOMEN: Soft, nontender, nondistended. Bowel sounds present. No organomegaly or mass.  EXTREMITIES: No cyanosis, clubbing or edema b/l.    NEUROLOGIC: Cranial nerves II through XII are intact. No focal Motor or sensory deficits b/l.   PSYCHIATRIC: The patient is alert and awake has anxiety SKIN: No obvious rash, lesion, or ulcer.   LABORATORY PANEL:   CBC Recent Labs  Lab 08/29/18 0521  WBC 5.6  HGB 10.3*  HCT 33.4*  PLT 197   ------------------------------------------------------------------------------------------------------------------ Chemistries  Recent Labs  Lab 08/23/18 1703  08/24/18 0333  08/29/18 0521  NA 137  --  139   < > 140  K 4.1  --  4.1   < > 5.0  CL 100  --  97*   < > 104  CO2 23  --  24   < > 27  GLUCOSE 126*  --  134*   < > 109*  BUN 35*  --  41*   < > 50*  CREATININE 2.68*  --  2.76*   < > 2.88*  CALCIUM 9.4  --  8.7*   < > 9.7  MG  --    < > 2.2  --   --   AST 37  --   --   --   --   ALT 34  --   --   --   --   ALKPHOS 116  --   --   --   --   BILITOT 0.8  --   --   --   --    < > =  values in this interval not displayed.   ------------------------------------------------------------------------------------------------------------------  Cardiac Enzymes No results for input(s): TROPONINI in the last 168 hours. ------------------------------------------------------------------------------------------------------------------  RADIOLOGY:  No results found.   ASSESSMENT AND PLAN:   Acute hypoxic respiratory failure secondary to combination of COPD and CHF. Weaned off BiPAP and now on oxygen via nasal cannula at 2 L Not in any respiratory distress  *Acute on chronic systolic heart failure with EF 30 to 35%.  Patient has left ventricular diffuse hypokinesia, Hold Lasix today  patient appears euvolemic, appreciate cardiology input. Blood pressure on the softer side  *COPD.  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  Creatinine is coming down to 2.88, baseline is around 1.9.  Likely acute kidney injury due to diuretics, patient is monitoring in the hospital at least couple more days before he is being discharged back to his  family care home  *Chronic iron deficiency anemia-hemoglobin at baseline -Continue home ferrous sulfate supplements  *Tobacco use -Nicotine patch as needed Tobacco cessation counseled to the patient for 6 minutes  *Alcohol abuse -CIWA -Thiamine, folate, MVI Patient is ward of state has legal guardian, palliative care consulted.  Adult Protective Services also involved due to concern for care by family including son.  Patient is from family care home, and will return to family care at discharge once renal function improves.  *Insomnia patient already on trazodone at bedtime.   All the records are reviewed and case discussed with Care Management/Social Worker Management plans discussed with the patient, family and they are in agreement.  CODE STATUS: Full code More than 50% time spent in counseling, coordination of care  TOTAL TIME TAKING  CARE OF THIS PATIENT: 36 minutes.   POSSIBLE D/C IN 1-2 DAYS, DEPENDING ON CLINICAL CONDITION.  Saundra Shelling M.D on 08/29/2018 at 10:31 AM  Between 7am to 6pm - Pager - [phone]  After 6pm go to [email or web address] - password [email or web address] [place] [facility]  Office  [phone]  CC: Primary care physician; Leone Haven, MD  Note: This dictation was prepared with Dragon dictation along with smaller phrase technology.  Any transcriptional errors that result from this process are unintentional.

## 2018-08-30 LAB — BASIC METABOLIC PANEL
Anion gap: 9 (ref 5–15)
BUN: 52 mg/dL — ABNORMAL HIGH (ref 8–23)
CO2: 26 mmol/L (ref 22–32)
Calcium: 9.4 mg/dL (ref 8.9–10.3)
Chloride: 106 mmol/L (ref 98–111)
Creatinine, Ser: 2.8 mg/dL — ABNORMAL HIGH (ref 0.61–1.24)
GFR calc Af Amer: 23 mL/min — ABNORMAL LOW (ref >60)
GFR calc non Af Amer: 20 mL/min — ABNORMAL LOW (ref >60)
Glucose, Bld: 103 mg/dL — ABNORMAL HIGH (ref 70–99)
Potassium: 4.9 mmol/L (ref 3.5–5.1)
Sodium: 141 mmol/L (ref 135–145)

## 2018-08-30 NOTE — Progress Notes (Signed)
[place] at [place] NAME: Kasir Hallenbeck    MR#:  [id number]  DATE OF BIRTH:  July 24, 1934  SUBJECTIVE: Seen at bedside, patient is feeling less anxious.  CHIEF COMPLAINT:   Chief Complaint  Patient presents with  . Weakness  . Shortness of Breath  Has generalized weakness Comfortable on oxygen by nasal cannula No fever No chest pain No new overnight events  REVIEW OF SYSTEMS:    Review of Systems  Constitutional: Negative for chills, fever and malaise/fatigue.  HENT: Negative for sore throat.   Eyes: Negative for blurred vision, double vision and pain.  Respiratory: Negative for cough, hemoptysis, shortness of breath and wheezing.   Cardiovascular: Negative for chest pain, palpitations, orthopnea and leg swelling.  Gastrointestinal: Negative for abdominal pain, constipation, diarrhea, heartburn, nausea and vomiting.  Genitourinary: Negative for dysuria and hematuria.  Musculoskeletal: Negative for back pain and joint pain.  Skin: Negative for rash.  Neurological: Negative for sensory change, speech change, focal weakness and headaches.  Endo/Heme/Allergies: Does not bruise/bleed easily.  Psychiatric/Behavioral: Positive for memory loss. Negative for depression. The patient has insomnia.     DRUG ALLERGIES:   Allergies  Allergen Reactions  . Aspirin Other (See Comments)    bleeding    VITALS:  Blood pressure (!) 124/95, pulse 69, temperature 98.3 F (36.8 C), temperature source Oral, resp. rate 16, height 5\' 7"  (1.702 m), weight 54.7 kg, SpO2 93 %.  PHYSICAL EXAMINATION:   Physical Exam  GENERAL:  84 y.o.-year-old patient lying in the bed with no acute distress.  Cachectic EYES: Pupils equal, round, reactive to light and accommodation. No scleral icterus. Extraocular muscles intact.  HEENT: Head atraumatic, normocephalic. Oropharynx and nasopharynx clear.  NECK:  Supple, no jugular venous distention. No thyroid enlargement, no  tenderness.  LUNGS: Normal breath sounds bilaterally, no wheezing, rales, rhonchi. No use of accessory muscles of respiration.  CARDIOVASCULAR: S1, S2 iregular no murmurs, rubs, or gallops.  ABDOMEN: Soft, nontender, nondistended. Bowel sounds present. No organomegaly or mass.  EXTREMITIES: No cyanosis, clubbing or edema b/l.    NEUROLOGIC: Cranial nerves II through XII are intact. No focal Motor or sensory deficits b/l.   PSYCHIATRIC: The patient is alert and awake has anxiety SKIN: No obvious rash, lesion, or ulcer.   LABORATORY PANEL:   CBC Recent Labs  Lab 08/29/18 0521  WBC 5.6  HGB 10.3*  HCT 33.4*  PLT 197   ------------------------------------------------------------------------------------------------------------------ Chemistries  Recent Labs  Lab 08/23/18 1703  08/24/18 0333  08/30/18 0640  NA 137  --  139   < > 141  K 4.1  --  4.1   < > 4.9  CL 100  --  97*   < > 106  CO2 23  --  24   < > 26  GLUCOSE 126*  --  134*   < > 103*  BUN 35*  --  41*   < > 52*  CREATININE 2.68*  --  2.76*   < > 2.80*  CALCIUM 9.4  --  8.7*   < > 9.4  MG  --    < > 2.2  --   --   AST 37  --   --   --   --   ALT 34  --   --   --   --   ALKPHOS 116  --   --   --   --   BILITOT 0.8  --   --   --   --    < > =  values in this interval not displayed.   ------------------------------------------------------------------------------------------------------------------  Cardiac Enzymes No results for input(s): TROPONINI in the last 168 hours. ------------------------------------------------------------------------------------------------------------------  RADIOLOGY:  No results found.   ASSESSMENT AND PLAN:   Acute hypoxic respiratory failure secondary to combination of COPD and CHF. Weaned off BiPAP and now on oxygen via nasal cannula at 2 L Not in any respiratory distress  *Acute on chronic systolic heart failure with EF 30 to 35%.  Patient has left ventricular diffuse  hypokinesia, Hold Lasix today patient appears euvolemic, appreciate cardiology input. Blood pressure on the softer side  *COPD.  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  Creatinine is coming down to 2.88, baseline is around 1.9.  Likely acute kidney injury due to diuretics, patient is monitoring in the hospital at least couple more days before he is being discharged back to his  family care home  *Chronic iron deficiency anemia-hemoglobin at baseline -Continue home ferrous sulfate supplements  *Tobacco use -Nicotine patch as needed Tobacco cessation counseled to the patient for 6 minutes  *Alcohol abuse -CIWA -Thiamine, folate, MVI Patient is ward of state has legal guardian, palliative care consulted.  Adult Protective Services also involved due to concern for care by family including son.  Patient is from family care home, and will have to discuss with guardian regarding disposition to family care home or hospice.  Social worker follow-up in a.m..  *Insomnia patient already on trazodone at bedtime.  *Status post physical therapy evaluation Home health services recommended   All the records are reviewed and case discussed with Care Management/Social Worker Management plans discussed with the patient, family and they are in agreement.  CODE STATUS: Full code More than 50% time spent in counseling, coordination of care  TOTAL TIME TAKING  CARE OF THIS PATIENT: 23 minutes.   POSSIBLE D/C IN 1-2 DAYS, DEPENDING ON CLINICAL CONDITION.  Saundra Shelling M.D on 08/30/2018 at 10:35 AM  Between 7am to 6pm - Pager - [phone]  After 6pm go to [email or web address] - password [email or web address] [facility] [facility]  Office   [phone]  CC: Primary care physician; Leone Haven, MD  Note: This dictation was prepared with Dragon dictation along with smaller phrase technology. Any transcriptional errors that result from this process are unintentional.

## 2018-08-31 DIAGNOSIS — R0602 Shortness of breath: Secondary | ICD-10-CM

## 2018-08-31 NOTE — NC FL2 (Signed)
[place] LEVEL OF CARE SCREENING TOOL     IDENTIFICATION  Patient Name: Justin Andrews Birthdate: [birth year] Sex: male Admission Date (Current Location): 08/23/2018  [facility] and [place] Number:  Engineering geologist and Address:  [facility], [address], [place], [place] [address]      Provider Number: [id number]  Attending Physician Name and Address:  Saundra Shelling, MD  Relative Name and Phone Number:       Current Level of Care: Hospital Recommended Level of Care: [place] Prior Approval Number:    Date Approved/Denied:   PASRR Number:    Discharge Plan: Other (Comment)(Family Care Home)    Current Diagnoses: Patient Active Problem List   Diagnosis Date Noted  . Paroxysmal atrial fibrillation (HCC)   . Acute pulmonary edema (HCC)   . Community acquired pneumonia of right lower lobe of lung ([place])   . Acute respiratory failure with hypoxia ([place]) 08/23/2018  . Encounter for competency evaluation 07/28/2018  . History of Clostridioides difficile infection 07/17/2018  . Insufficient social support 06/27/2018  . Abdominal pain 05/28/2018  . GI bleed 05/02/2018  . Hemorrhagic shock ([place]) 05/02/2018  . Altered mental status 05/02/2018  . Anemia due to blood loss, acute 05/02/2018  . RLS (restless legs syndrome) 04/22/2018  . Wrist pain 04/22/2018  . Chronic hip pain 04/08/2018  . Suicidal ideation 03/19/2018  . Protein-calorie malnutrition, severe 12/03/2017  . Closed left hip fracture ([place]) 11/30/2017  . Diarrhea 11/15/2017  . Duodenum disorder 11/15/2017  . Frequency of urination 10/21/2017  . Cigarette smoker 10/06/2017  . Cough 09/25/2017  . Changes in vision 09/25/2017  . Proteinuria 09/25/2017  . Basal cell carcinoma 02/28/2017  . 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 DOE (dyspnea on exertion) 01/17/2017  . Chronic right upper quadrant pain 11/15/2016  . Balance problem 11/15/2016  . Simple renal  cyst 10/21/2016  . Prostate nodule 10/16/2016  . Dysuria 10/16/2016  . CKD (chronic kidney disease) stage 3, GFR 30-59 ml/min (HCC) 08/05/2016  . Anemia 08/05/2016  . Anxiety and depression 08/05/2016  . Weight loss 08/05/2016  . Sleep disturbance 07/23/2016  . Hypertension   . Osteoarthritis   . Tobacco abuse   . 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[place]) 03/06/2016    Orientation RESPIRATION BLADDER Height & Weight     Self, Situation, Place  Normal External catheter Weight: 54.7 kg Height:  5\' 7"  (170.2 cm)  BEHAVIORAL SYMPTOMS/MOOD NEUROLOGICAL BOWEL NUTRITION STATUS      Continent Diet(2 gram NA diet)  AMBULATORY STATUS COMMUNICATION OF NEEDS Skin   Limited Assist Verbally Normal                       Personal Care Assistance Level of Assistance  Bathing, Feeding, Dressing Bathing Assistance: Limited assistance Feeding assistance: Independent Dressing Assistance: Limited assistance     Functional Limitations Info             SPECIAL CARE FACTORS FREQUENCY  PT (By licensed PT)     PT Frequency: Home Health 2 times per week              Contractures Contractures Info: Not present    Additional Factors Info  Code Status, Allergies Code Status Info: Full Allergies Info: Aspirin           Current Medications (08/31/2018):  This is the current hospital active medication list Current Facility-Administered Medications  Medication Dose Route  Frequency Provider Last Rate Last Dose  . acetaminophen (TYLENOL) tablet 650 mg  650 mg Oral Q6H PRN Sela Hua, MD   650 mg at 08/25/18 0981   Or  . acetaminophen (TYLENOL) suppository 650 mg  650 mg Rectal Q6H PRN Mayo, Pete Pelt, MD      . albuterol (PROVENTIL) (2.5 MG/3ML) 0.083% nebulizer solution 2.5 mg  2.5 mg Nebulization Q2H PRN Epifanio Lesches, MD   2.5 mg at 08/30/18 1123  . amiodarone (PACERONE) tablet 400 mg  400 mg Oral BID Wilhelmina Mcardle, MD   400 mg at 08/31/18 0945  . calcium-vitamin D  (OSCAL WITH D) 500-200 MG-UNIT per tablet 1 tablet  1 tablet Oral BID Mayo, Pete Pelt, MD   1 tablet at 08/31/18 0944  . chlorhexidine (PERIDEX) 0.12 % solution 15 mL  15 mL Mouth Rinse BID Tukov-Yual, Magdalene S, NP   15 mL at 08/31/18 0945  . Chlorhexidine Gluconate Cloth 2 % PADS 6 each  6 each Topical Daily Mayo, Pete Pelt, MD   6 each at 08/30/18 0932  . ferrous sulfate tablet 325 mg  325 mg Oral Daily Mayo, Pete Pelt, MD   325 mg at 08/31/18 0945  . heparin injection 5,000 Units  5,000 Units Subcutaneous Q8H Epifanio Lesches, MD   5,000 Units at 08/31/18 0549  . ipratropium-albuterol (DUONEB) 0.5-2.5 (3) MG/3ML nebulizer solution 3 mL  3 mL Nebulization TID Epifanio Lesches, MD   3 mL at 08/31/18 0742  . LORazepam (ATIVAN) tablet 1 mg  1 mg Oral Q4H PRN Wilhelmina Mcardle, MD   1 mg at 08/31/18 1914  . MEDLINE mouth rinse  15 mL Mouth Rinse q12n4p Tukov-Yual, Magdalene S, NP   15 mL at 08/30/18 1704  . metoprolol tartrate (LOPRESSOR) tablet 12.5 mg  12.5 mg Oral BID Wilhelmina Mcardle, MD   12.5 mg at 08/31/18 0945  . mirtazapine (REMERON) tablet 15 mg  15 mg Oral QHS Mayo, Pete Pelt, MD   15 mg at 08/30/18 2033  . multivitamin with minerals tablet 1 tablet  1 tablet Oral Daily Epifanio Lesches, MD   1 tablet at 08/31/18 0945  . nicotine (NICODERM CQ - dosed in mg/24 hours) patch 14 mg  14 mg Transdermal Daily PRN Mayo, Pete Pelt, MD      . ondansetron [facility]) injection 4 mg  4 mg Intravenous Q6H PRN Mayo, Pete Pelt, MD      . pantoprazole (PROTONIX) EC tablet 40 mg  40 mg Oral Daily Mayo, Pete Pelt, MD   40 mg at 08/31/18 0944  . polyethylene glycol (MIRALAX / GLYCOLAX) packet 17 g  17 g Oral Daily Tukov-Yual, Magdalene S, NP   17 g at 08/31/18 0944  . prednisoLONE acetate (PRED FORTE) 1 % ophthalmic suspension 1 drop  1 drop Right Eye QID Charlett Nose, RPH   1 drop at 08/31/18 0945  . protein supplement (ENSURE MAX) liquid  11 oz Oral BID Epifanio Lesches, MD   11 oz at  08/31/18 0946  . tamsulosin (FLOMAX) capsule 0.4 mg  0.4 mg Oral Daily Mayo, Pete Pelt, MD   0.4 mg at 08/30/18 2033  . traMADol (ULTRAM) tablet 50-100 mg  50-100 mg Oral Q12H Epifanio Lesches, MD   100 mg at 08/31/18 0945  . traZODone (DESYREL) tablet 100 mg  100 mg Oral QHS Mayo, Pete Pelt, MD   100 mg at 08/30/18 2033     Discharge Medications: Please see discharge summary  for a list of discharge medications.  Relevant Imaging Results:  Relevant Lab Results:   Additional Information SS# [id number]  Shelbie Hutching, RN

## 2018-08-31 NOTE — Progress Notes (Signed)
PALLIATIVE NOTE:  Patient is awake, alert, and somewhat anxious in conversation. Appears comfortable and tolerating nasal cannula. Denies pain.   I have not been able to speak back with legal guardian, Bridgette Habermann. Voicemail is full and unable to leave a message. Patient appears to be back to baseline. According to notations by CM/SW team, Kirke Shaggy (APS supervisor) informed team that patient "is under their legal guardianship however given it is a protective order they are not legally allowed to make him a DNR/EOL decisions." MOST form was faxed to [facility] on last week as she requested and copy is on file in the patient's chart pending DSS is able to obtain authority and decisions. Form requires signatures from guardians and provider.   Recommendations remain for DNR/DNI once guardians have authority to legally make decisions and hospice support. If patient underwent heroic/life-prolonging measures due to a sudden cardiopulmonary arrest he would not have a meaningful recovery. Life-prolonging measures will not add any additional quality to patient's life and will only cause prolong suffering and further medical challenges in addition to his chronic co-morbidities and overall functional decline. Patient has poor prognosis and will continue to decline in health and encounter recurrent hospitalizations related to his co-morbidities. He is not appropriate for hospice facility but is appropriate for outpatient hospice support at an outside facility or home. If patient is to discharge to a facility with home health/PT; outpatient palliative at minimum is recommended to be involved in patient's care for continued goals of care with guardians.   Palliative Medicine team will continue to follow patient distantly and will re-engage if need arise or once notification received from APS or medical team that decisions can be made by legal guardian.   Assessment: -awake, alert,  Somewhat anxious -IRRR, diminished in  bases, generalized weakness   Plan: -Full Code -Continue with current plan of care per attending -CM/SW working on disposition  -Recommendation for outpatient hospice and DNR, however if patient discharges to facility or home with home health/PT would recommend outpatient Palliative at minimum -PMT will continue to support and follow distantly. Please call with needs.   Total Time: 25 min.   Greater than 50%  of this time was spent counseling and coordinating care related to the above assessment and plan.  Alda Lea, AGPCNP-BC Palliative Medicine Team

## 2018-08-31 NOTE — Progress Notes (Signed)
Physical Therapy Treatment Patient Details Name: Justin Andrews MRN: [id number] DOB: 04/08/1934 Today's Date: 08/31/2018    History of Present Illness Pt admitted for ARF with hypoxia with complaints of SOB and weakness x 5 days. History includes melanoma, HTN, depression, GERD, and multiple falls (3) in last 12 months.     PT Comments    Pt initially declines PT noting he is having a bad day, but with continued encouragement, pt agreeable to attempt. Pt notes L knee pain and difficulty standing. Pt states, "my L knee always gives out; that's my problem." Pt demonstrates bed mobility with Mod I with mild increased time and use of rails. Good sitting balance edge of bed. STS with rolling walker with Min A and cues for full upright posture. Pt with heavy lean on rolling walker and slightly aligned to the R; with encouraged midline, pt feels unsteady. Ultimately not overt L knee buckling; however, pt sits after 30 second stand and declines further stand or ambulation attempt. Attempted education for LE exercises, but pt declines as well. Continue PT to progress strength, endurance and participation to improve all functional mobility.    Follow Up Recommendations  Home health PT;Supervision/Assistance - 24 hour     Equipment Recommendations  Wheelchair (measurements PT);Rolling walker with 5" wheels;3in1 (PT)    Recommendations for Other Services       Precautions / Restrictions Precautions Precautions: Fall Restrictions Weight Bearing Restrictions: No    Mobility  Bed Mobility Overal bed mobility: Modified Independent             General bed mobility comments: use of rail and mild increased time effort  Transfers Overall transfer level: Needs assistance Equipment used: Rolling walker (2 wheeled) Transfers: Sit to/from Stand Sit to Stand: Min assist         General transfer comment: Pt reports difficulty with weight bearing on L due to pain and L knee buckling. Pt stands 30  seconds with Min A when encouraging equal weight bearing. No overt buckling noted, but generalized weakness with heavy reliance on rw. Pt reports need to sit refusing further ambulation/exercise attempts.   Ambulation/Gait             General Gait Details: refuses attempt   Stairs             Wheelchair Mobility    Modified Rankin (Stroke Patients Only)       Balance Overall balance assessment: Needs assistance;History of Falls Sitting-balance support: Bilateral upper extremity supported;Feet supported Sitting balance-Leahy Scale: Fair(Fair -)                                      Cognition Arousal/Alertness: Awake/alert Behavior During Therapy: WFL for tasks assessed/performed Overall Cognitive Status: Within Functional Limits for tasks assessed                                        Exercises      General Comments        Pertinent Vitals/Pain Pain Assessment: Faces Faces Pain Scale: Hurts little more Pain Location: L knee Pain Descriptors / Indicators: Constant;Aching;Guarding;Grimacing Pain Intervention(s): Monitored during session;Limited activity within patient's tolerance    Home Living  Prior Function            PT Goals (current goals can now be found in the care plan section) Progress towards PT goals: Not progressing toward goals - comment    Frequency    Min 2X/week      PT Plan Current plan remains appropriate    Co-evaluation              AM-PAC PT "6 Clicks" Mobility   Outcome Measure  Help needed turning from your back to your side while in a flat bed without using bedrails?: A Little Help needed moving from lying on your back to sitting on the side of a flat bed without using bedrails?: A Little Help needed moving to and from a bed to a chair (including a wheelchair)?: A Little Help needed standing up from a chair using your arms (e.g., wheelchair or  bedside chair)?: A Little Help needed to walk in hospital room?: A Lot Help needed climbing 3-5 steps with a railing? : A Lot 6 Click Score: 16    End of Session Equipment Utilized During Treatment: Gait belt Activity Tolerance: Patient limited by pain;Other (comment)(self limiting) Patient left: in bed;with call bell/phone within reach;with bed alarm set;with nursing/sitter in room Nurse Communication: Other (comment)(pt wish for milkshake) PT Visit Diagnosis: Unsteadiness on feet (R26.81);Repeated falls (R29.6);Muscle weakness (generalized) (M62.81);History of falling (Z91.81);Difficulty in walking, not elsewhere classified (R26.2)     Time: 1694-5038 PT Time Calculation (min) (ACUTE ONLY): 20 min  Charges:  $Therapeutic Activity: 8-22 mins                      Larae Grooms, PTA 08/31/2018, 2:44 PM

## 2018-08-31 NOTE — TOC Progression Note (Signed)
Transition of Care [facility]) - Progression Note    Patient Details  Name: TRAYVON TRUMBULL MRN: [id number] Date of Birth: [birth year]  [facility]) CM/SW Contact  Shelbie Hutching, RN Phone Number: 08/31/2018, 9:44 AM  Clinical Narrative:    Message left with Bridgette Habermann for return call to discuss home health and or hospice.    Expected Discharge Plan: Group Home Barriers to Discharge: Continued Medical Work up  Expected Discharge Plan and Services Expected Discharge Plan: Group Home     Post Acute Care Choice: Durable Medical Equipment Living arrangements for the past 2 months: Group Home                 DME Arranged: Youth worker wheelchair with seat cushion DME Agency: [employer] Date DME Agency Contacted: 08/27/18 Time DME Agency Contacted: [phone] Representative spoke with at DME Agency: Emailed Sunday Corn             Social Determinants of Health ([place]) Interventions    Readmission Risk Interventions Readmission Risk Prevention Plan 05/07/2018  Transportation Screening Complete  PCP or Specialist Appt within 3-5 Days Complete  HRI or Home Care Consult Complete  Medication Review (RN Care Manager) Complete  Some recent data might be hidden

## 2018-08-31 NOTE — Care Management Important Message (Signed)
Important Message  Patient Details  Name: Justin Andrews MRN: [id number] Date of Birth: 1934-09-04   Medicare Important Message Given:  Yes     Juliann Pulse A Jarren Para 08/31/2018, 11:37 AM
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
## 2018-08-31 NOTE — Progress Notes (Signed)
Nutrition Follow-up  RD working remotely.  DOCUMENTATION CODES:   Underweight  INTERVENTION:  Continue Ensure Max Protein po BID, each supplement provides 150 kcal and 30 grams of protein.  Continue daily MVI.  NUTRITION DIAGNOSIS:   Increased nutrient needs related to chronic illness(COPD) as evidenced by estimated needs.  Ongoing.  GOAL:   Patient will meet greater than or equal to 90% of their needs  Progressing.  MONITOR:   PO intake, Supplement acceptance, Labs, Weight trends, Skin, I & O's  REASON FOR ASSESSMENT:   Other (Comment)(Low BMI)    ASSESSMENT:   84 y.o. male with h/o melanoma, hip fracture s/p repair, s/p exlap and repair of bleeding duodenal ulcer, duodenoplasty and J tube placement on 5/1, hypertension, CHF, depression, COPD, tobacco use, chronic kidney disease III and alcohol abuse admitted with A-fib .  Attempted to speak with patient over the phone but he was unable to answer. Per chart he is eating 70-100% of meals. He is drinking 1-2 bottles of Ensure Max per day. Pending placement.  Medications reviewed and include: amiodarone, Oscal with D, ferrous sulfate 325 mg daily, Remeron 15 mg QHS, MVI daily, pantoprazole, Flomax.  Labs reviewed.  Diet Order:   Diet Order            Diet 2 gram sodium Room service appropriate? Yes; Fluid consistency: Thin  Diet effective now             EDUCATION NEEDS:   No education needs have been identified at this time  Skin:  Skin Assessment: Reviewed RN Assessment  Last BM:  08/31/2018 - large type 6  Height:   Ht Readings from Last 1 Encounters:  08/23/18 5\' 7"  (1.702 m)   Weight:   Wt Readings from Last 1 Encounters:  08/23/18 54.7 kg   Ideal Body Weight:  67 kg  BMI:  Body mass index is 18.89 kg/m.  Estimated Nutritional Needs:   Kcal:  1600-1800kcal/day  Protein:  80-90g/day  Fluid:  >1.4L/day  Willey Blade, MS, RD, LDN Office: [phone] Pager: [phone] After  Hours/Weekend Pager: [phone]
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
## 2018-09-01 ENCOUNTER — Inpatient Hospital Stay: Payer: Medicare HMO

## 2018-09-01 MED ORDER — TUBERCULIN PPD 5 UNIT/0.1ML ID SOLN
5.0000 [IU] | Freq: Once | INTRADERMAL | Status: AC
  Administered 2018-09-01: 14:00:00 5 [IU] via INTRADERMAL
  Filled 2018-09-01: qty 0.1

## 2018-09-01 NOTE — Progress Notes (Signed)
Patient continues to pull off cardiac monitoring.  Verbal order from Dr. Estanislado Pandy to d/c order.  Clarise Cruz, BSN

## 2018-09-01 NOTE — Progress Notes (Addendum)
[facility] at [place] NAME: Justin Andrews    MR#:  [id number]  DATE OF BIRTH:  1934/10/11  SUBJECTIVE: Seen at bedside, patient is feeling less anxious.  CHIEF COMPLAINT:   Chief Complaint  Patient presents with  . Weakness  . Shortness of Breath  Has generalized weakness Comfortable on oxygen by nasal cannula Blood pressure is softer this morning Awaiting bed at new group home No fever No new overnight events  REVIEW OF SYSTEMS:    Review of Systems  Constitutional: Negative for chills, fever and malaise/fatigue.  HENT: Negative for sore throat.   Eyes: Negative for blurred vision, double vision and pain.  Respiratory: Negative for cough, hemoptysis, shortness of breath and wheezing.   Cardiovascular: Negative for chest pain, palpitations, orthopnea and leg swelling.  Gastrointestinal: Negative for abdominal pain, constipation, diarrhea, heartburn, nausea and vomiting.  Genitourinary: Negative for dysuria and hematuria.  Musculoskeletal: Negative for back pain and joint pain.  Skin: Negative for rash.  Neurological: Negative for sensory change, speech change, focal weakness and headaches.  Endo/Heme/Allergies: Does not bruise/bleed easily.  Psychiatric/Behavioral: Positive for memory loss. Negative for depression. The patient has insomnia.     DRUG ALLERGIES:   Allergies  Allergen Reactions  . Aspirin Other (See Comments)    bleeding    VITALS:  Blood pressure (!) 96/48, pulse 63, temperature 98.2 F (36.8 C), temperature source Oral, resp. rate 16, height 5\' 7"  (1.702 m), weight 54.7 kg, SpO2 97 %.  PHYSICAL EXAMINATION:   Physical Exam  GENERAL:  84 y.o.-year-old patient lying in the bed with no acute distress.  Cachectic EYES: Pupils equal, round, reactive to light and accommodation. No scleral icterus. Extraocular muscles intact.  HEENT: Head atraumatic, normocephalic. Oropharynx and nasopharynx clear.  NECK:  Supple,  no jugular venous distention. No thyroid enlargement, no tenderness.  LUNGS: Normal breath sounds bilaterally, no wheezing, rales, rhonchi. No use of accessory muscles of respiration.  CARDIOVASCULAR: S1, S2 iregular no murmurs, rubs, or gallops.  ABDOMEN: Soft, nontender, nondistended. Bowel sounds present. No organomegaly or mass.  EXTREMITIES: No cyanosis, clubbing or edema b/l.    NEUROLOGIC: Cranial nerves II through XII are intact. No focal Motor or sensory deficits b/l.   PSYCHIATRIC: The patient is alert and awake has anxiety SKIN: No obvious rash, lesion, or ulcer.   LABORATORY PANEL:   CBC Recent Labs  Lab 08/29/18 0521  WBC 5.6  HGB 10.3*  HCT 33.4*  PLT 197   ------------------------------------------------------------------------------------------------------------------ Chemistries  Recent Labs  Lab 08/30/18 0640  NA 141  K 4.9  CL 106  CO2 26  GLUCOSE 103*  BUN 52*  CREATININE 2.80*  CALCIUM 9.4   ------------------------------------------------------------------------------------------------------------------  Cardiac Enzymes No results for input(s): TROPONINI in the last 168 hours. ------------------------------------------------------------------------------------------------------------------  RADIOLOGY:  No results found.   ASSESSMENT AND PLAN:   Acute hypoxic respiratory failure secondary to combination of COPD and CHF. Resolved Weaned off BiPAP and now on oxygen via nasal cannula at 2 L Not in any respiratory distress  *Acute on chronic systolic heart failure with EF 30 to 35%.  Patient has left ventricular diffuse hypokinesia, Hold Lasix today patient appears euvolemic, appreciate cardiology input. Blood pressure on the softer side Hold beta-blocker  *COPD.  Continue nebulizers and inhalers On oxygen via nasal cannula  *Paroxysmal Atrial fibrillation with RVR improved, continue amiodarone, discontinued heparin drip, no recommendation  for oral anticoagulants as per cardiology  *AKI in CKD III-IV-c monitor kidney  function closely, avoid nephrotoxic agents.  Creatinine is coming down to 2.88, baseline is around 1.9.  Likely acute kidney injury due to diuretics, patient is monitoring in the hospital at least couple more days before he is being discharged back to his  family care home  *Chronic iron deficiency anemia-hemoglobin at baseline -Continue home ferrous sulfate supplements  *Tobacco use -Nicotine patch as needed Tobacco cessation counseled to the patient for 6 minutes  *Alcohol abuse -CIWA -Thiamine, folate, MVI Patient is ward of state has legal guardian, palliative care consulted.  Adult Protective Services also involved due to concern for care by family including son.  Patient is from family care home, and will have to discuss with guardian regarding disposition to family care home or hospice.  Social worker follow-up for new group home placement  *Insomnia patient already on trazodone at bedtime.  *Status post physical therapy evaluation Home health services recommended   * chest x-ray for group home placement PPD testing All the records are reviewed and case discussed with Care Management/Social Worker Management plans discussed with the patient, family and they are in agreement.  CODE STATUS: Full code More than 50% time spent in counseling, coordination of care  TOTAL TIME TAKING  CARE OF THIS PATIENT: 22 minutes.   POSSIBLE D/C IN 1-2 DAYS, DEPENDING ON CLINICAL CONDITION.  Saundra Shelling M.D on 09/01/2018 at 9:46 AM  Between 7am to 6pm - Pager - [phone]  After 6pm go to [email or web address] - password [email or web address] [facility]  Office  [phone]  CC: Primary care physician; Leone Haven, MD  Note: This dictation was prepared with Dragon dictation along with smaller phrase technology. Any transcriptional errors that result from this process are unintentional.

## 2018-09-01 NOTE — Progress Notes (Signed)
Physical Therapy Treatment Patient Details Name: Justin Andrews MRN: [id number] DOB: Apr 06, 1934 Today's Date: 09/01/2018    History of Present Illness Pt admitted for ARF with hypoxia with complaints of SOB and weakness x 5 days. History includes melanoma, HTN, depression, GERD, and multiple falls (3) in last 12 months.     PT Comments    Pt agreeable to PT; pt notes need to use bathroom and agreeable to ambulate in room. Pt notes feeling better today. Pt demonstrating I with in/out of bed. STS with rolling walker with Min A; Mod A to/from low commode and use of rail with increased difficulty with descent. Pt requires set up and supervision for toileting and hand hygiene with Min guard during static stand for safety. Ambulation (of note, pt declines to wear socks for ambulation) requires Min guard and safety cues, as pt has tendency to either pick up rolling walker or abandon it while attempting path with obstacles despite clear path maintained for pt. Pt returns to bed and declines further ambulation/PT interventions at this time. Pt does demonstrate much improvement from last session. Continue PT to progress strength, balance and safety to improve all functional mobility.   Follow Up Recommendations  Home health PT;Supervision/Assistance - 24 hour     Equipment Recommendations  Wheelchair (measurements PT);Rolling walker with 5" wheels;3in1 (PT)    Recommendations for Other Services       Precautions / Restrictions Precautions Precautions: Fall Restrictions Weight Bearing Restrictions: No    Mobility  Bed Mobility Overal bed mobility: Independent                Transfers Overall transfer level: Needs assistance Equipment used: Rolling walker (2 wheeled) Transfers: Sit to/from Stand Sit to Stand: Min assist         General transfer comment: Min A from bed; Mod A from low commode with pt use of rail to sit/stand.   Ambulation/Gait Ambulation/Gait assistance: Min  guard Gait Distance (Feet): 25 Feet Assistive device: Rolling walker (2 wheeled)     Gait velocity interpretation: <1.8 ft/sec, indicate of risk for recurrent falls General Gait Details: Overall steady, but guard for safety, as pt tends to lift rw on occasion over a potential obstacle versus taking clear path or abandon rw.    Stairs             Wheelchair Mobility    Modified Rankin (Stroke Patients Only)       Balance Overall balance assessment: Needs assistance;History of Falls Sitting-balance support: Bilateral upper extremity supported;Feet supported Sitting balance-Leahy Scale: Good     Standing balance support: Bilateral upper extremity supported Standing balance-Leahy Scale: Fair                              Cognition Arousal/Alertness: Awake/alert Behavior During Therapy: WFL for tasks assessed/performed Overall Cognitive Status: Within Functional Limits for tasks assessed                                        Exercises Other Exercises Other Exercises: supervision/set up for toileting    General Comments        Pertinent Vitals/Pain Pain Assessment: No/denies pain    Home Living                      Prior Function  PT Goals (current goals can now be found in the care plan section) Progress towards PT goals: Progressing toward goals    Frequency    Min 2X/week      PT Plan Current plan remains appropriate    Co-evaluation              AM-PAC PT "6 Clicks" Mobility   Outcome Measure  Help needed turning from your back to your side while in a flat bed without using bedrails?: None Help needed moving from lying on your back to sitting on the side of a flat bed without using bedrails?: None Help needed moving to and from a bed to a chair (including a wheelchair)?: A Little Help needed standing up from a chair using your arms (e.g., wheelchair or bedside chair)?: A Little Help  needed to walk in hospital room?: A Little Help needed climbing 3-5 steps with a railing? : A Lot 6 Click Score: 19    End of Session Equipment Utilized During Treatment: Gait belt Activity Tolerance: Patient tolerated treatment well Patient left: in bed;with call bell/phone within reach;with bed alarm set   PT Visit Diagnosis: Unsteadiness on feet (R26.81);Repeated falls (R29.6);Muscle weakness (generalized) (M62.81);History of falling (Z91.81);Difficulty in walking, not elsewhere classified (R26.2)     Time: 1345-1411 PT Time Calculation (min) (ACUTE ONLY): 26 min  Charges:  $Gait Training: 8-22 mins $Therapeutic Activity: 8-22 mins                      Larae Grooms, PTA 09/01/2018, 2:18 PM

## 2018-09-02 NOTE — TOC Progression Note (Signed)
Transition of Care [facility]) - Progression Note    Patient Details  Name: Justin Andrews MRN: [id number] Date of Birth: [birth year]  Transition of Care [facility] CM/SW Contact  Shelbie Hutching, RN Phone Number: 09/02/2018, 2:12 PM  Clinical Narrative:     DSS has found patient placement at a group home in [facility].  The bed will be available for the patient this upcoming Friday.  Bridgette Habermann social worker with DSS will pick patient up on Friday and take him to his new group home.  RNCM will arrange home health services.    Expected Discharge Plan: Group Home Barriers to Discharge: Continued Medical Work up  Expected Discharge Plan and Services Expected Discharge Plan: Group Home     Post Acute Care Choice: Durable Medical Equipment Living arrangements for the past 2 months: Group Home                 DME Arranged: Youth worker wheelchair with seat cushion DME Agency: [employer] Date DME Agency Contacted: 08/27/18 Time DME Agency Contacted: [phone] Representative spoke with at DME Agency: Emailed [employer]             Social Determinants of Health ([place]) Interventions    Readmission Risk Interventions Readmission Risk Prevention Plan 05/07/2018  Transportation Screening Complete  PCP or Specialist Appt within 3-5 Days Complete  HRI or Home Care Consult Complete  Medication Review (RN Care Manager) Complete  Some recent data might be hidden

## 2018-09-02 NOTE — Progress Notes (Signed)
[place] at [place] NAME: Ruben Mahler    MR#:  [id number]  DATE OF BIRTH:  1934-04-04  SUBJECTIVE: Seen at bedside, patient is feeling less anxious.  CHIEF COMPLAINT:   Chief Complaint  Patient presents with  . Weakness  . Shortness of Breath  Has generalized weakness Weaned off oxygen Blood pressure better this morning Awaiting bed at new group home No fever No new overnight events  REVIEW OF SYSTEMS:    Review of Systems  Constitutional: Negative for chills, fever and malaise/fatigue.  HENT: Negative for sore throat.   Eyes: Negative for blurred vision, double vision and pain.  Respiratory: Negative for cough, hemoptysis, shortness of breath and wheezing.   Cardiovascular: Negative for chest pain, palpitations, orthopnea and leg swelling.  Gastrointestinal: Negative for abdominal pain, constipation, diarrhea, heartburn, nausea and vomiting.  Genitourinary: Negative for dysuria and hematuria.  Musculoskeletal: Negative for back pain and joint pain.  Skin: Negative for rash.  Neurological: Negative for sensory change, speech change, focal weakness and headaches.  Endo/Heme/Allergies: Does not bruise/bleed easily.  Psychiatric/Behavioral: Positive for memory loss. Negative for depression. The patient has insomnia.     DRUG ALLERGIES:   Allergies  Allergen Reactions  . Aspirin Other (See Comments)    bleeding    VITALS:  Blood pressure (!) 102/45, pulse 61, temperature 98.2 F (36.8 C), temperature source Oral, resp. rate 16, height 5\' 7"  (1.702 m), weight 54.7 kg, SpO2 97 %.  PHYSICAL EXAMINATION:   Physical Exam  GENERAL:  84 y.o.-year-old patient lying in the bed with no acute distress.  Cachectic EYES: Pupils equal, round, reactive to light and accommodation. No scleral icterus. Extraocular muscles intact.  HEENT: Head atraumatic, normocephalic. Oropharynx and nasopharynx clear.  NECK:  Supple, no jugular venous  distention. No thyroid enlargement, no tenderness.  LUNGS: Normal breath sounds bilaterally, no wheezing, rales, rhonchi. No use of accessory muscles of respiration.  CARDIOVASCULAR: S1, S2 iregular no murmurs, rubs, or gallops.  ABDOMEN: Soft, nontender, nondistended. Bowel sounds present. No organomegaly or mass.  EXTREMITIES: No cyanosis, clubbing or edema b/l.    NEUROLOGIC: Cranial nerves II through XII are intact. No focal Motor or sensory deficits b/l.   PSYCHIATRIC: The patient is alert and awake has anxiety SKIN: No obvious rash, lesion, or ulcer.   LABORATORY PANEL:   CBC Recent Labs  Lab 08/29/18 0521  WBC 5.6  HGB 10.3*  HCT 33.4*  PLT 197   ------------------------------------------------------------------------------------------------------------------ Chemistries  Recent Labs  Lab 08/30/18 0640  NA 141  K 4.9  CL 106  CO2 26  GLUCOSE 103*  BUN 52*  CREATININE 2.80*  CALCIUM 9.4   ------------------------------------------------------------------------------------------------------------------  Cardiac Enzymes No results for input(s): TROPONINI in the last 168 hours. ------------------------------------------------------------------------------------------------------------------  RADIOLOGY:  Dg Chest 2 View  Result Date: 09/01/2018 CLINICAL DATA:  Follow-up pneumonia EXAM: CHEST - 2 VIEW COMPARISON:  08/25/2018 FINDINGS: Cardiomegaly, vascular congestion. Mild interstitial prominence in the lower lungs could reflect residual edema. This is improved since prior study. Right base atelectasis with small right effusion. IMPRESSION: Cardiomegaly with vascular congestion and improving interstitial edema. Small right effusion with right base atelectasis. Electronically Signed   By: Rolm Baptise M.D.   On: 09/01/2018 16:03     ASSESSMENT AND PLAN:   Acute hypoxic respiratory failure secondary to combination of COPD and CHF. Resolved Weaned off BiPAP and now  on oxygen via nasal cannula at 2 L Not in any respiratory distress  *  Acute on chronic systolic heart failure with EF 30 to 35%.  Patient has left ventricular diffuse hypokinesia, Hold Lasix today patient appears euvolemic, appreciate cardiology input. Blood pressure on the softer side Hold beta-blocker  *COPD.  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  Creatinine is coming down to 2.88, baseline is around 1.9.  Likely acute kidney injury due to diuretics, patient is monitoring in the hospital at least couple more days before he is being discharged back to his  family care home  *Chronic iron deficiency anemia-hemoglobin at baseline -Continue home ferrous sulfate supplements  *Tobacco use -Nicotine patch as needed Tobacco cessation counseled to the patient for 6 minutes  *Alcohol abuse -CIWA -Thiamine, folate, MVI Patient is ward of state has legal guardian, palliative care consulted.  Adult Protective Services also involved due to concern for care by family including son.  Patient is from family care home, and will have to discuss with guardian regarding disposition to family care home or hospice.  Social worker follow-up for new group home placement  *Insomnia patient already on trazodone at bedtime.  *Status post physical therapy evaluation Home health services recommended   * chest x-ray for group home placement reviewed Improved vascular congestion PPD testing done  All the records are reviewed and case discussed with Care Management/Social Worker Management plans discussed with the patient, family and they are in agreement.  CODE STATUS: Full code More than 50% time spent in counseling, coordination of care   TOTAL TIME TAKING  CARE OF THIS PATIENT: 21 minutes.   POSSIBLE D/C IN 1-2 DAYS, DEPENDING ON CLINICAL CONDITION.  Saundra Shelling M.D on 09/02/2018 at 10:15 AM  Between 7am to 6pm - Pager - [phone]  After 6pm go to [email or web address] - password [email or web address] [facility]  Office  [phone]  CC: Primary care physician; Leone Haven, MD  Note: This dictation was prepared with Dragon dictation along with smaller phrase technology. Any transcriptional errors that result from this process are unintentional.

## 2018-09-02 NOTE — Progress Notes (Signed)
New referral for out patient palliative to follow at [facility] received from [facility]. Patient information faxed to referral. Flo Shanks BSN, RN, [place] [phone]

## 2018-09-03 ENCOUNTER — Ambulatory Visit: Payer: Medicare HMO | Admitting: Family

## 2018-09-03 MED ORDER — AMIODARONE HCL 400 MG PO TABS: 200.0000 mg | ORAL_TABLET | Freq: Two times a day (BID) | ORAL | 0 refills | Status: DC

## 2018-09-03 MED ORDER — METOPROLOL TARTRATE 25 MG PO TABS: 12.5000 mg | ORAL_TABLET | Freq: Every day | ORAL | 0 refills | Status: DC

## 2018-09-03 MED ORDER — FERROUS SULFATE 325 (65 FE) MG PO TABS: 325.0000 mg | ORAL_TABLET | Freq: Every day | ORAL | 0 refills | Status: DC

## 2018-09-03 MED ORDER — POLYETHYLENE GLYCOL 3350 17 G PO PACK: 17.0000 g | PACK | Freq: Every day | ORAL | 0 refills | Status: DC

## 2018-09-03 NOTE — Care Management Important Message (Signed)
Important Message  Patient Details  Name: Justin Andrews MRN: [id number] Date of Birth: 1934-10-24   Medicare Important Message Given:  Yes     Juliann Pulse A Reann Dobias 09/03/2018, 12:06 PM

## 2018-09-03 NOTE — Progress Notes (Signed)
[facility] NAME: Justin Andrews    MR#:  [id number]  DATE OF BIRTH:  05-25-1934  SUBJECTIVE: Seen at bedside, patient is feeling less anxious.  CHIEF COMPLAINT:   Chief Complaint  Patient presents with  . Weakness  . Shortness of Breath  Has generalized weakness Weaned off oxygen Blood pressure better this morning Awaiting bed at new group home No fever No new overnight events  REVIEW OF SYSTEMS:    Review of Systems  Constitutional: Negative for chills, fever and malaise/fatigue.  HENT: Negative for sore throat.   Eyes: Negative for blurred vision, double vision and pain.  Respiratory: Negative for cough, hemoptysis, shortness of breath and wheezing.   Cardiovascular: Negative for chest pain, palpitations, orthopnea and leg swelling.  Gastrointestinal: Negative for abdominal pain, constipation, diarrhea, heartburn, nausea and vomiting.  Genitourinary: Negative for dysuria and hematuria.  Musculoskeletal: Negative for back pain and joint pain.  Skin: Negative for rash.  Neurological: Negative for sensory change, speech change, focal weakness and headaches.  Endo/Heme/Allergies: Does not bruise/bleed easily.  Psychiatric/Behavioral: Positive for memory loss. Negative for depression. The patient has insomnia.     DRUG ALLERGIES:   Allergies  Allergen Reactions  . Aspirin Other (See Comments)    bleeding    VITALS:  Blood pressure (!) 101/57, pulse 64, temperature 98.3 F (36.8 C), resp. rate 16, height 5\' 7"  (1.702 m), weight 54.7 kg, SpO2 94 %.  PHYSICAL EXAMINATION:   Physical Exam  GENERAL:  84 y.o.-year-old patient lying in the bed with no acute distress.  Cachectic EYES: Pupils equal, round, reactive to light and accommodation. No scleral icterus. Extraocular muscles intact.  HEENT: Head atraumatic, normocephalic. Oropharynx and nasopharynx clear.  NECK:  Supple, no jugular venous distention. No thyroid  enlargement, no tenderness.  LUNGS: Normal breath sounds bilaterally, no wheezing, rales, rhonchi. No use of accessory muscles of respiration.  CARDIOVASCULAR: S1, S2 iregular no murmurs, rubs, or gallops.  ABDOMEN: Soft, nontender, nondistended. Bowel sounds present. No organomegaly or mass.  EXTREMITIES: No cyanosis, clubbing or edema b/l.    NEUROLOGIC: Cranial nerves II through XII are intact. No focal Motor or sensory deficits b/l.   PSYCHIATRIC: The patient is alert and awake has anxiety SKIN: No obvious rash, lesion, or ulcer.   LABORATORY PANEL:   CBC Recent Labs  Lab 08/29/18 0521  WBC 5.6  HGB 10.3*  HCT 33.4*  PLT 197   ------------------------------------------------------------------------------------------------------------------ Chemistries  Recent Labs  Lab 08/30/18 0640  NA 141  K 4.9  CL 106  CO2 26  GLUCOSE 103*  BUN 52*  CREATININE 2.80*  CALCIUM 9.4   ------------------------------------------------------------------------------------------------------------------  Cardiac Enzymes No results for input(s): TROPONINI in the last 168 hours. ------------------------------------------------------------------------------------------------------------------  RADIOLOGY:  Dg Chest 2 View  Result Date: 09/01/2018 CLINICAL DATA:  Follow-up pneumonia EXAM: CHEST - 2 VIEW COMPARISON:  08/25/2018 FINDINGS: Cardiomegaly, vascular congestion. Mild interstitial prominence in the lower lungs could reflect residual edema. This is improved since prior study. Right base atelectasis with small right effusion. IMPRESSION: Cardiomegaly with vascular congestion and improving interstitial edema. Small right effusion with right base atelectasis. Electronically Signed   By: Rolm Baptise M.D.   On: 09/01/2018 16:03     ASSESSMENT AND PLAN:   Acute hypoxic respiratory failure secondary to combination of COPD and CHF. Resolved Weaned off BiPAP and now on oxygen via nasal  cannula at 2 L Not in any respiratory distress  *Acute on chronic  systolic heart failure with EF 30 to 35%.  Patient has left ventricular diffuse hypokinesia, Hold Lasix today patient appears euvolemic, appreciate cardiology input. Blood pressure on the softer side Hold beta-blocker  *COPD.  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  Creatinine is coming down to 2.88, baseline is around 1.9.  Likely acute kidney injury due to diuretics, patient is monitoring in the hospital at least couple more days before he is being discharged back to his  family care home  *Chronic iron deficiency anemia-hemoglobin at baseline -Continue home ferrous sulfate supplements  *Tobacco use -Nicotine patch as needed Tobacco cessation counseled to the patient for 6 minutes  *Alcohol abuse -CIWA -Thiamine, folate, MVI Patient is ward of state has legal guardian, palliative care consulted.  Adult Protective Services also involved due to concern for care by family including son.  Patient is from family care home, and will have to discuss with guardian regarding disposition to family care home or hospice.  Social worker follow-up for new group home placement  *Insomnia patient already on trazodone at bedtime.  *Status post physical therapy evaluation Home health services recommended   * chest x-ray for group home placement reviewed Improved vascular congestion PPD testing done  *outpatient palliative care follow up  All the records are reviewed and case discussed with Care Management/Social Worker Management plans discussed with the patient, family and they are in agreement.  CODE STATUS: Full code More than 50% time spent in counseling,  coordination of care  TOTAL TIME TAKING  CARE OF THIS PATIENT: 20 minutes.   POSSIBLE D/C IN 1-2 DAYS, DEPENDING ON CLINICAL CONDITION.  Saundra Shelling M.D on 09/03/2018 at 9:56 AM  Between 7am to 6pm - Pager - [phone]  After 6pm go to [email or web address] - password [email or web address] [facility]  Office  [phone]  CC: Primary care physician; Leone Haven, MD  Note: This dictation was prepared with Dragon dictation along with smaller phrase technology. Any transcriptional errors that result from this process are unintentional.

## 2018-09-03 NOTE — Progress Notes (Signed)
PT Cancellation Note  Patient Details Name: Justin Andrews MRN: [id number] DOB: October 26, 1934   Cancelled Treatment:    Reason Eval/Treat Not Completed: Patient declined, no reason specified  Pt reports that he wants to wait for a "warmer morning" to work with PT and despite multiple attempts encouraging him to at least do a few exercises or go for a walk he cunningly avoids the topic and begins to ramble off topic.  Pt not willing to participate with PT this date, slated to discharge tomorrow.   Kreg Shropshire, DPT 09/03/2018, 4:37 PM

## 2018-09-03 NOTE — TOC Progression Note (Signed)
Transition of Care [facility]) - Progression Note    Patient Details  Name: ANIELLO CHRISTOPOULOS MRN: [id number] Date of Birth: 13-Aug-1934  Transition of Care [facility] CM/SW Contact  Shelbie Hutching, RN Phone Number: 09/03/2018, 3:11 PM  Clinical Narrative:    Leroy Sea with [employer] with bring the patient's wheelchair up tomorrow morning before discharge.  Floydene Flock with [facility] has agreed to accept patient for home health services at the new group home.  Dr. Estanislado Pandy has e scripted all prescriptions to the new pharmacy in [place].  Patient will be ready for discharge tomorrow.    Expected Discharge Plan: Group Home Barriers to Discharge: Continued Medical Work up  Expected Discharge Plan and Services Expected Discharge Plan: Group Home     Post Acute Care Choice: Durable Medical Equipment Living arrangements for the past 2 months: Group Home                 DME Arranged: Youth worker wheelchair with seat cushion DME Agency: [employer] Date DME Agency Contacted: 08/27/18 Time DME Agency Contacted: [phone] Representative spoke with at DME Agency: Emailed [place] HH Arranged: RN, PT, OT, Nurse's Aide [place] Agency: [place] ([place]) Date [place]: 09/03/18 Time [place]: 1510 Representative spoke with at [place]: [place] (SDOH) Interventions    Readmission Risk Interventions Readmission Risk Prevention Plan 05/07/2018  Transportation Screening Complete  PCP or Specialist Appt within 3-5 Days Complete  HRI or Home Care Consult Complete  Medication Review (RN Care Manager) Complete  Some recent data might be hidden

## 2018-09-03 NOTE — Progress Notes (Signed)
PPD negative 0 mm induration

## 2018-09-04 NOTE — NC FL2 (Addendum)
[place] LEVEL OF CARE SCREENING TOOL     IDENTIFICATION  Patient Name: Justin Andrews Birthdate: 04-09-1934 Sex: male Admission Date (Current Location): 08/23/2018  [facility] and [place] Number:  Engineering geologist and Address:  [facility], [address] [place], [place] [address]      Provider Number: [id number]  Attending Physician Name and Address:  Saundra Shelling, MD  Relative Name and Phone Number:       Current Level of Care: Hospital Recommended Level of Care: [place] Prior Approval Number:    Date Approved/Denied:   PASRR Number:    Discharge Plan: Other (Comment)(Family Care Home)    Current Diagnoses: Patient Active Problem List   Diagnosis Date Noted  . Paroxysmal atrial fibrillation (HCC)   . Acute pulmonary edema (HCC)   . Community acquired pneumonia of right lower lobe of lung ([place])   . Acute respiratory failure with hypoxia ([place]) 08/23/2018  . Encounter for competency evaluation 07/28/2018  . History of Clostridioides difficile infection 07/17/2018  . Insufficient social support 06/27/2018  . Abdominal pain 05/28/2018  . GI bleed 05/02/2018  . Hemorrhagic shock ([place]) 05/02/2018  . Altered mental status 05/02/2018  . Anemia due to blood loss, acute 05/02/2018  . RLS (restless legs syndrome) 04/22/2018  . Wrist pain 04/22/2018  . Chronic hip pain 04/08/2018  . Suicidal ideation 03/19/2018  . Protein-calorie malnutrition, severe 12/03/2017  . Closed left hip fracture ([place]) 11/30/2017  . Diarrhea 11/15/2017  . Duodenum disorder 11/15/2017  . Frequency of urination 10/21/2017  . Cigarette smoker 10/06/2017  . Cough 09/25/2017  . Changes in vision 09/25/2017  . Proteinuria 09/25/2017  . Basal cell carcinoma 02/28/2017  . 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 DOE (dyspnea on exertion) 01/17/2017  . Chronic right upper quadrant pain 11/15/2016  . Balance problem 11/15/2016  . Simple renal  cyst 10/21/2016  . Prostate nodule 10/16/2016  . Dysuria 10/16/2016  . CKD (chronic kidney disease) stage 3, GFR 30-59 ml/min (HCC) 08/05/2016  . Anemia 08/05/2016  . Anxiety and depression 08/05/2016  . Weight loss 08/05/2016  . Sleep disturbance 07/23/2016  . Hypertension   . Osteoarthritis   . Tobacco abuse   . 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[place]) 03/06/2016    Orientation RESPIRATION BLADDER Height & Weight     Self, Situation, Place  Normal External catheter Weight: 54.7 kg Height:  5\' 7"  (170.2 cm)  BEHAVIORAL SYMPTOMS/MOOD NEUROLOGICAL BOWEL NUTRITION STATUS      Continent Diet(2 gram NA diet)  AMBULATORY STATUS COMMUNICATION OF NEEDS Skin   Limited Assist Verbally Normal                       Personal Care Assistance Level of Assistance  Bathing, Feeding, Dressing Bathing Assistance: Limited assistance Feeding assistance: Independent Dressing Assistance: Limited assistance     Functional Limitations Info             SPECIAL CARE FACTORS FREQUENCY  PT (By licensed PT)     PT Frequency: Home Health 2 times per week              Contractures Contractures Info: Not present    Additional Factors Info  Code Status, Allergies Code Status Info: Full Allergies Info: Aspirin           Current Medications (09/04/2018):  This is the current hospital active medication list Current Facility-Administered Medications  Medication Dose Route  Frequency Provider Last Rate Last Dose  . acetaminophen (TYLENOL) tablet 650 mg  650 mg Oral Q6H PRN Sela Hua, MD   650 mg at 08/25/18 6720   Or  . acetaminophen (TYLENOL) suppository 650 mg  650 mg Rectal Q6H PRN Mayo, Pete Pelt, MD      . albuterol (PROVENTIL) (2.5 MG/3ML) 0.083% nebulizer solution 2.5 mg  2.5 mg Nebulization Q2H PRN Epifanio Lesches, MD   2.5 mg at 09/01/18 1133  . amiodarone (PACERONE) tablet 400 mg  400 mg Oral BID Wilhelmina Mcardle, MD   400 mg at 09/04/18 9470  . calcium-vitamin D  (OSCAL WITH D) 500-200 MG-UNIT per tablet 1 tablet  1 tablet Oral BID Mayo, Pete Pelt, MD   1 tablet at 09/04/18 [phone]  . chlorhexidine (PERIDEX) 0.12 % solution 15 mL  15 mL Mouth Rinse BID Tukov-Yual, Magdalene S, NP   15 mL at 09/03/18 2214  . Chlorhexidine Gluconate Cloth 2 % PADS 6 each  6 each Topical Daily Mayo, Pete Pelt, MD   6 each at 09/03/18 [phone]  . ferrous sulfate tablet 325 mg  325 mg Oral Daily Mayo, Pete Pelt, MD   325 mg at 09/04/18 0907  . heparin injection 5,000 Units  5,000 Units Subcutaneous Q8H Epifanio Lesches, MD   5,000 Units at 09/04/18 [phone]  . ipratropium-albuterol (DUONEB) 0.5-2.5 (3) MG/3ML nebulizer solution 3 mL  3 mL Nebulization TID Epifanio Lesches, MD   3 mL at 09/04/18 0740  . LORazepam (ATIVAN) tablet 1 mg  1 mg Oral Q4H PRN Wilhelmina Mcardle, MD   1 mg at 09/04/18 5465  . MEDLINE mouth rinse  15 mL Mouth Rinse q12n4p Tukov-Yual, Magdalene S, NP   15 mL at 09/02/18 1749  . metoprolol tartrate (LOPRESSOR) tablet 12.5 mg  12.5 mg Oral BID Wilhelmina Mcardle, MD   12.5 mg at 09/04/18 0908  . mirtazapine (REMERON) tablet 15 mg  15 mg Oral QHS Mayo, Pete Pelt, MD   15 mg at 09/03/18 2215  . multivitamin with minerals tablet 1 tablet  1 tablet Oral Daily Epifanio Lesches, MD   1 tablet at 09/03/18 0757  . nicotine (NICODERM CQ - dosed in mg/24 hours) patch 14 mg  14 mg Transdermal Daily PRN Mayo, Pete Pelt, MD      . ondansetron [facility]) injection 4 mg  4 mg Intravenous Q6H PRN Mayo, Pete Pelt, MD      . pantoprazole (PROTONIX) EC tablet 40 mg  40 mg Oral Daily Mayo, Pete Pelt, MD   40 mg at 09/04/18 0354  . polyethylene glycol (MIRALAX / GLYCOLAX) packet 17 g  17 g Oral Daily Tukov-Yual, Magdalene S, NP   17 g at 09/03/18 0758  . prednisoLONE acetate (PRED FORTE) 1 % ophthalmic suspension 1 drop  1 drop Right Eye QID Charlett Nose, RPH   1 drop at 09/04/18 6568  . protein supplement (ENSURE MAX) liquid  11 oz Oral BID Epifanio Lesches, MD   11 oz at  09/03/18 2214  . tamsulosin (FLOMAX) capsule 0.4 mg  0.4 mg Oral Daily Mayo, Pete Pelt, MD   0.4 mg at 09/03/18 2215  . traMADol (ULTRAM) tablet 50-100 mg  50-100 mg Oral Q12H Epifanio Lesches, MD   100 mg at 09/04/18 0907  . traZODone (DESYREL) tablet 100 mg  100 mg Oral QHS Mayo, Pete Pelt, MD   100 mg at 09/03/18 2214     Discharge Medications: Medication List  STOP taking these medications       ALPRAZolam 0.25 MG tablet Commonly known as: XANAX   doxycycline 100 MG tablet Commonly known as: VIBRA-TABS   sulfamethoxazole-trimethoprim 800-160 MG tablet Commonly known as: BACTRIM DS             TAKE these medications       amiodarone 400 MG tablet Commonly known as: PACERONE Take 0.5 tablets (200 mg total) by mouth 2 (two) times daily.   calcium-vitamin D 500-200 MG-UNIT tablet Commonly known as: OSCAL WITH D Take 1 tablet by mouth 2 (two) times daily.   Durezol 0.05 % Emul Generic drug: Difluprednate Place 1 drop into the right eye daily.   ferrous sulfate 325 (65 FE) MG tablet Commonly known as: FeroSul Take 1 tablet (325 mg total) by mouth daily. What changed:   medication strength  See the new instructions.   folic acid 1 MG tablet Commonly known as: FOLVITE TAKE 1 TABLET BY MOUTH DAILY   metoprolol tartrate 25 MG tablet Commonly known as: LOPRESSOR Take 0.5 tablets (12.5 mg total) by mouth daily.   mirtazapine 30 MG tablet Commonly known as: REMERON Take 0.5 tablets (15 mg total) by mouth at bedtime.   pantoprazole 40 MG tablet Commonly known as: PROTONIX TAKE 1 TABLET DAILY AT 7:00 AM ON AN EMPTY STOMACH What changed: See the new instructions.   polyethylene glycol 17 g packet Commonly known as: MIRALAX / GLYCOLAX Take 17 g by mouth daily.   tamsulosin 0.4 MG Caps capsule Commonly known as: FLOMAX TAKE 1 CAPSULE BY MOUTH DAILY   thiamine 100 MG tablet Take 1 tablet (100 mg total) by mouth daily.   traZODone  100 MG tablet Commonly known as: DESYREL Take 1 tablet (100 mg total) by mouth at bedtime.             Relevant Imaging Results:  Relevant Lab Results:   Additional Information:  Home Health arranged with [facility] for RN, PT, OT, and aide.    SS# [id number]  Shelbie Hutching, RN

## 2018-09-04 NOTE — Progress Notes (Signed)
Pt being discharged to group home, discharge instructions sent to group home, copy also sent in packet with guardian, pt with no complaints

## 2018-09-04 NOTE — Progress Notes (Signed)
Patient suffers from COPD which impairs their ability to perform daily activities like ambulating in the home.  A rolling walker will not resolve  issue with performing activities of daily living. A wheelchair will allow patient to safely perform daily activities. Patient is not able to propel themselves in the home using a standard weight wheelchair due to weakness and shortness of breath. Patient can self propel in the lightweight wheelchair. Length of need 99. Accessories: elevating leg rests (ELRs), wheel locks, extensions and anti-tippers, seat cushion, and back cushion.

## 2018-09-04 NOTE — Discharge Summary (Addendum)
[facility] NAME: Justin Andrews    MR#:  [id number]  DATE OF BIRTH:  1934/01/22  DATE OF ADMISSION:  08/23/2018 ADMITTING PHYSICIAN: Sela Hua, MD  DATE OF DISCHARGE: 09/04/2018  PRIMARY CARE PHYSICIAN: Leone Haven, MD   ADMISSION DIAGNOSIS:  Shortness of breath [R06.02] Acute pulmonary edema ([place]) [J81.0] Community acquired pneumonia of right lower lobe of lung ([place]) [J18.1]  DISCHARGE DIAGNOSIS:  Active Problems:   Acute respiratory failure with hypoxia ([place])   Acute pulmonary edema ([place])   Community acquired pneumonia of right lower lobe of lung (HCC)   Paroxysmal atrial fibrillation ([place]) Acute on chronic systolic heart failure Acute on chronic kidney disease stage III Chronic iron deficiency anemia Tobacco abuse Alcohol abuse  SECONDARY DIAGNOSIS:   Past Medical History:  Diagnosis Date  . Alcohol abuse   . C. difficile diarrhea 11/2017  . Chronic kidney disease (CKD), stage III (moderate) (HCC)   . Depression   . GERD (gastroesophageal reflux disease)   . GI bleed    a. bleeding duodenal ulcers in 04/2018 s/p cauterization and duodenalplasty  . History of melanoma   . Hypertension   . Osteoarthritis   . PAF (paroxysmal atrial fibrillation) ([place])    a. initially noted on 05/06/2018  . Tobacco abuse      ADMITTING HISTORY Justin Andrews  is a 84 y.o. male with a known history of melanoma, hypertension, depression, tobacco use who presented to the ED with shortness of breath over the last 5 days.  He states that his shortness of breath is getting progressively worse.  He endorses cough productive of yellow sputum.  He also endorses overall weakness.  He denies any chest pain or palpitations.  He does endorse some significant lower extremity edema over the last few weeks.In the ED, he was tachycardic with heart rate 111 and in new onset A. Fib.  He was also tachypneic. He required 4 L O2 by nasal cannula.  Labs  were significant for creatinine 2.68, BNP 1820, troponin 69, hemoglobin 11.3.  Chest x-ray with new diffuse pulmonary edema and patchy airspace disease at the right lung, suspicious for pneumonia.  He was given ceftriaxone and azithromycin.  He was also given a dose of IV Lasix.  Hospitalist were called for admission.  HOSPITAL COURSE:  Patient was admitted to stepdown initially on BiPAP for respiratory distress and hypoxia was given Lasix for diuresis for pulmonary edema and started on broad-spectrum antibiotics..  Tobacco cessation was counseled to the patient.  Patient was put on CIWA protocol for alcohol abuse.  Nephrotoxic meds were held for renal insufficiency.  Patient's heparin drip was discontinued as per cardiology recommendations and no oral anticoagulants recommended.  Patient continue nebulizer and inhaler treatments for COPD.  Patient was weaned off BiPAP and put on oxygen via nasal cannula which she was subsequently weaned off.  He was followed by palliative care during hospitalization.  Patient was seen by intensivist as well as cardiology services during hospitalization.  Patient was worked up with echocardiogram during hospitalization and noted to have EF of 30 to 35%.  Patient completed course of antibiotics for pneumonia.  Patient's Lasix was also stopped because patient was euvolemic and blood pressure was on the softer side.  Outpatient palliative services follow-up CONSULTS OBTAINED:  Cardiology consult Intensivist consult  DRUG ALLERGIES:   Allergies  Allergen Reactions  . Aspirin Other (See Comments)    bleeding    DISCHARGE  MEDICATIONS:   Allergies as of 09/04/2018      Reactions   Aspirin Other (See Comments)   bleeding      Medication List    STOP taking these medications   ALPRAZolam 0.25 MG tablet Commonly known as: XANAX   doxycycline 100 MG tablet Commonly known as: VIBRA-TABS   sulfamethoxazole-trimethoprim 800-160 MG tablet Commonly known as:  BACTRIM DS     TAKE these medications   amiodarone 400 MG tablet Commonly known as: PACERONE Take 0.5 tablets (200 mg total) by mouth 2 (two) times daily.   calcium-vitamin D 500-200 MG-UNIT tablet Commonly known as: OSCAL WITH D Take 1 tablet by mouth 2 (two) times daily.   Durezol 0.05 % Emul Generic drug: Difluprednate Place 1 drop into the right eye daily.   ferrous sulfate 325 (65 FE) MG tablet Commonly known as: FeroSul Take 1 tablet (325 mg total) by mouth daily. What changed:   medication strength  See the new instructions.   folic acid 1 MG tablet Commonly known as: FOLVITE TAKE 1 TABLET BY MOUTH DAILY   metoprolol tartrate 25 MG tablet Commonly known as: LOPRESSOR Take 0.5 tablets (12.5 mg total) by mouth daily.   mirtazapine 30 MG tablet Commonly known as: REMERON Take 0.5 tablets (15 mg total) by mouth at bedtime.   pantoprazole 40 MG tablet Commonly known as: PROTONIX TAKE 1 TABLET DAILY AT 7:00 AM ON AN EMPTY STOMACH What changed: See the new instructions.   polyethylene glycol 17 g packet Commonly known as: MIRALAX / GLYCOLAX Take 17 g by mouth daily.   tamsulosin 0.4 MG Caps capsule Commonly known as: FLOMAX TAKE 1 CAPSULE BY MOUTH DAILY   thiamine 100 MG tablet Take 1 tablet (100 mg total) by mouth daily.   traZODone 100 MG tablet Commonly known as: DESYREL Take 1 tablet (100 mg total) by mouth at bedtime.            Durable Medical Equipment  (From admission, onward)         Start     Ordered   08/27/18 1610  For home use only DME lightweight manual wheelchair with seat cushion  Once    Comments: Patient suffers from COPD which impairs their ability to perform daily activities like ambulating in the home.  A rolling walker will not resolve  issue with performing activities of daily living. A wheelchair will allow patient to safely perform daily activities. Patient is not able to propel themselves in the home using a standard  weight wheelchair due to weakness and shortness of breath. Patient can self propel in the lightweight wheelchair. Length of need 99. Accessories: elevating leg rests (ELRs), wheel locks, extensions and anti-tippers, seat cushion, and back cushion.   08/27/18 1611          Today  Patient seen today Tolerating diet well Hemodynamically stable VITAL SIGNS:  Blood pressure 106/65, pulse 62, temperature 98.7 F (37.1 C), temperature source Oral, resp. rate 16, height 5\' 7"  (1.702 m), weight 54.7 kg, SpO2 92 %.  I/O:    Intake/Output Summary (Last 24 hours) at 09/04/2018 1045 Last data filed at 09/04/2018 1014 Gross per 24 hour  Intake 760 ml  Output -  Net 760 ml    PHYSICAL EXAMINATION:  Physical Exam  GENERAL:  84 y.o.-year-old patient lying in the bed with no acute distress.  LUNGS: Normal breath sounds bilaterally, no wheezing, rales,rhonchi or crepitation. No use of accessory muscles of respiration.  CARDIOVASCULAR: S1,  S2 normal. No murmurs, rubs, or gallops.  ABDOMEN: Soft, non-tender, non-distended. Bowel sounds present. No organomegaly or mass.  NEUROLOGIC: Moves all 4 extremities. PSYCHIATRIC: The patient is alert and oriented x 3.  SKIN: No obvious rash, lesion, or ulcer.   DATA REVIEW:   CBC Recent Labs  Lab 08/29/18 0521  WBC 5.6  HGB 10.3*  HCT 33.4*  PLT 197    Chemistries  Recent Labs  Lab 08/30/18 0640  NA 141  K 4.9  CL 106  CO2 26  GLUCOSE 103*  BUN 52*  CREATININE 2.80*  CALCIUM 9.4    Cardiac Enzymes No results for input(s): TROPONINI in the last 168 hours.  Microbiology Results  Results for orders placed or performed during the hospital encounter of 08/23/18  SARS Coronavirus 2 [facility] order, Performed in [facility] hospital lab) Nasopharyngeal Nasopharyngeal Swab     Status: None   Collection Time: 08/23/18  5:03 PM   Specimen: Nasopharyngeal Swab  Result Value Ref Range Status   SARS Coronavirus 2 NEGATIVE NEGATIVE Final     Comment: (NOTE) If result is NEGATIVE SARS-CoV-2 target nucleic acids are NOT DETECTED. The SARS-CoV-2 RNA is generally detectable in upper and lower  respiratory specimens during the acute phase of infection. The lowest  concentration of SARS-CoV-2 viral copies this assay can detect is 250  copies / mL. A negative result does not preclude SARS-CoV-2 infection  and should not be used as the sole basis for treatment or other  patient management decisions.  A negative result may occur with  improper specimen collection / handling, submission of specimen other  than nasopharyngeal swab, presence of viral mutation(s) within the  areas targeted by this assay, and inadequate number of viral copies  (<250 copies / mL). A negative result must be combined with clinical  observations, patient history, and epidemiological information. If result is POSITIVE SARS-CoV-2 target nucleic acids are DETECTED. The SARS-CoV-2 RNA is generally detectable in upper and lower  respiratory specimens dur ing the acute phase of infection.  Positive  results are indicative of active infection with SARS-CoV-2.  Clinical  correlation with patient history and other diagnostic information is  necessary to determine patient infection status.  Positive results do  not rule out bacterial infection or co-infection with other viruses. If result is PRESUMPTIVE POSTIVE SARS-CoV-2 nucleic acids MAY BE PRESENT.   A presumptive positive result was obtained on the submitted specimen  and confirmed on repeat testing.  While 2019 novel coronavirus  (SARS-CoV-2) nucleic acids may be present in the submitted sample  additional confirmatory testing may be necessary for epidemiological  and / or clinical management purposes  to differentiate between  SARS-CoV-2 and other Sarbecovirus currently known to infect humans.  If clinically indicated additional testing with an alternate test  methodology [phone]) is advised. The SARS-CoV-2  RNA is generally  detectable in upper and lower respiratory sp ecimens during the acute  phase of infection. The expected result is Negative. Fact Sheet for Patients:  StrictlyIdeas.no Fact Sheet for Healthcare Providers: BankingDealers.co.za This test is not yet approved or cleared by the [place] FDA and has been authorized for detection and/or diagnosis of SARS-CoV-2 by FDA under an Emergency Use Authorization (EUA).  This EUA will remain in effect (meaning this test can be used) for the duration of the COVID-19 declaration under Section 564(b)(1) of the Act, 21 U.S.C. section 360bbb-3(b)(1), unless the authorization is terminated or revoked sooner. Performed at [facility],   [address] [place], [place] [address]   Blood culture (routine x 2)     Status: None   Collection Time: 08/23/18  5:03 PM   Specimen: BLOOD  Result Value Ref Range Status   Specimen Description BLOOD RAC  Final   Special Requests   Final    BOTTLES DRAWN AEROBIC AND ANAEROBIC Blood Culture adequate volume   Culture   Final    NO GROWTH 5 DAYS Performed at [facility], [address] [place], [place] [address]    Report Status 08/28/2018 FINAL  Final  Blood culture (routine x 2)     Status: None   Collection Time: 08/23/18  5:03 PM   Specimen: BLOOD  Result Value Ref Range Status   Specimen Description BLOOD R F ARM  Final   Special Requests   Final    BOTTLES DRAWN AEROBIC AND ANAEROBIC Blood Culture adequate volume   Culture   Final    NO GROWTH 5 DAYS Performed at [facility], [address] [place], [place] [address]    Report Status 08/28/2018 FINAL  Final  MRSA PCR Screening     Status: None   Collection Time: 08/23/18  9:32 PM   Specimen: Nasopharyngeal  Result Value Ref Range Status   MRSA by PCR NEGATIVE NEGATIVE Final    Comment:        The GeneXpert MRSA Assay (FDA approved for NASAL  specimens only), is one component of a comprehensive MRSA colonization surveillance program. It is not intended to diagnose MRSA infection nor to guide or monitor treatment for MRSA infections. Performed at [facility], [address] [place], [place] [address]     RADIOLOGY:  No results found.  Follow up with PCP in 1 week.  Management plans discussed with the patient, family and they are in agreement.  CODE STATUS: Full code    Code Status Orders  (From admission, onward)         Start     Ordered   08/23/18 2132  Full code  Continuous     08/23/18 2131        Code Status History    Date Active Date Inactive Code Status Order ID Comments User Context   05/29/2018 0016 06/17/2018 1637 DNR [id number]  Mayer Camel, NP ED   05/29/2018 0016 05/29/2018 0016 DNR [id number]  Mayer Camel, NP ED   05/02/2018 1427 05/26/2018 2140 DNR [id number]  Vaughan Basta, MD Inpatient   05/02/2018 0804 05/02/2018 1427 DNR [id number]  Delman Kitten, MD ED   03/13/2018 1929 03/14/2018 2030 Full Code [id number]  Nena Polio, MD ED   11/30/2017 1846 12/01/2017 2040 Full Code [id number]  [place], MD Inpatient   11/30/2017 1601 11/30/2017 1846 Full Code [id number]  Demetrios Loll, MD Inpatient   Advance Care Planning Activity      TOTAL TIME TAKING CARE OF THIS PATIENT ON DAY OF DISCHARGE: more than 35 minutes.   Saundra Shelling M.D on 09/04/2018 at 10:45 AM  Between 7am to 6pm - Pager - [phone]  After 6pm go to [email or web address] - password [email or web address] [facility]  Office  [phone]  CC: Primary care physician; Leone Haven, MD  Note: This dictation was prepared with Dragon dictation along with smaller phrase technology. Any transcriptional errors that result from this process are unintentional.

## 2018-09-04 NOTE — Progress Notes (Signed)
PT Cancellation Note  Patient Details Name: OLAOLUWA GRIEDER MRN: [id number] DOB: 12-Sep-1934   Cancelled Treatment:    Reason Eval/Treat Not Completed: Patient declined, no reason specified(Patient sleeping upon arrival to room.  Awakens to light touch and acknowledges therapist.  Consistently refuses participation with session.  Unable to redirect or faciltiate despite encouragement, offer of multiple activities.  Will continue efforts at later time/date as patient agreeable to participation.)   Teodoro Jeffreys H. Owens Shark, PT, DPT, NCS 09/04/18, 12:00 PM [phone]

## 2018-09-04 NOTE — TOC Transition Note (Addendum)
Transition of Care [facility]) - CM/SW Discharge Note   Patient Details  Name: Justin Andrews MRN: [id number] Date of Birth: 1934/05/06  [facility] CM/SW Contact:  Shelbie Hutching, RN Phone Number: 09/04/2018, 12:59 PM   Clinical Narrative:    Patient discharging to [facility] in [place].  Bridgette Habermann with DSS and picking patient up today around 1pm.  Patient's wheelchair was delivered yesterday to the room.  All medications were faxed to [facility] in [place] yesterday.  [facility] has been arranged.    Final next level of care: Group Home Barriers to Discharge: Barriers Resolved   Patient Goals and CMS Choice        Discharge Placement                       Discharge Plan and Services     Post Acute Care Choice: Durable Medical Equipment          DME Arranged: Lightweight manual wheelchair with seat cushion DME Agency: [employer] Date DME Agency Contacted: 09/03/18 Time DME Agency Contacted: [phone] Representative spoke with at DME Agency: [facility] HH Arranged: RN, PT, OT, Nurse's Aide [facility] Agency: [place] ([place]) Date [place]: 09/04/18 Time [place]: 1259 Representative spoke with at [place]: [place] (SDOH) Interventions     Readmission Risk Interventions Readmission Risk Prevention Plan 05/07/2018  Transportation Screening Complete  PCP or Specialist Appt within 3-5 Days Complete  HRI or Home Care Consult Complete  Medication Review (RN Care Manager) Complete  Some recent data might be hidden

## 2018-09-05 ENCOUNTER — Emergency Department (HOSPITAL_COMMUNITY)
Admission: EM | Admit: 2018-09-05 | Discharge: 2018-09-13 | Disposition: A | Discharge status: Home / Self Care | Payer: Medicare HMO | Attending: Emergency Medicine | Admitting: Emergency Medicine

## 2018-09-05 ENCOUNTER — Encounter (HOSPITAL_COMMUNITY): Payer: Self-pay

## 2018-09-05 ENCOUNTER — Emergency Department (HOSPITAL_COMMUNITY): Payer: Medicare HMO

## 2018-09-05 DIAGNOSIS — S51011A Laceration without foreign body of right elbow, initial encounter: Secondary | ICD-10-CM | POA: Diagnosis not present

## 2018-09-05 DIAGNOSIS — Z03818 Encounter for observation for suspected exposure to other biological agents ruled out: Secondary | ICD-10-CM | POA: Diagnosis not present

## 2018-09-05 DIAGNOSIS — R0902 Hypoxemia: Secondary | ICD-10-CM | POA: Diagnosis not present

## 2018-09-05 DIAGNOSIS — K529 Noninfective gastroenteritis and colitis, unspecified: Secondary | ICD-10-CM

## 2018-09-05 DIAGNOSIS — Y939 Activity, unspecified: Secondary | ICD-10-CM | POA: Diagnosis not present

## 2018-09-05 DIAGNOSIS — F17210 Nicotine dependence, cigarettes, uncomplicated: Secondary | ICD-10-CM | POA: Insufficient documentation

## 2018-09-05 DIAGNOSIS — Y92530 Ambulatory surgery center as the place of occurrence of the external cause: Secondary | ICD-10-CM | POA: Diagnosis not present

## 2018-09-05 DIAGNOSIS — N183 Chronic kidney disease, stage 3 (moderate): Secondary | ICD-10-CM | POA: Diagnosis not present

## 2018-09-05 DIAGNOSIS — W06XXXA Fall from bed, initial encounter: Secondary | ICD-10-CM | POA: Insufficient documentation

## 2018-09-05 DIAGNOSIS — K625 Hemorrhage of anus and rectum: Secondary | ICD-10-CM | POA: Diagnosis not present

## 2018-09-05 DIAGNOSIS — I517 Cardiomegaly: Secondary | ICD-10-CM | POA: Diagnosis not present

## 2018-09-05 DIAGNOSIS — R1084 Generalized abdominal pain: Secondary | ICD-10-CM | POA: Diagnosis not present

## 2018-09-05 DIAGNOSIS — Z79899 Other long term (current) drug therapy: Secondary | ICD-10-CM | POA: Diagnosis not present

## 2018-09-05 DIAGNOSIS — I129 Hypertensive chronic kidney disease with stage 1 through stage 4 chronic kidney disease, or unspecified chronic kidney disease: Secondary | ICD-10-CM | POA: Diagnosis not present

## 2018-09-05 DIAGNOSIS — Y999 Unspecified external cause status: Secondary | ICD-10-CM | POA: Insufficient documentation

## 2018-09-05 DIAGNOSIS — F32A Depression, unspecified: Secondary | ICD-10-CM | POA: Diagnosis present

## 2018-09-05 DIAGNOSIS — R9431 Abnormal electrocardiogram [ECG] [EKG]: Secondary | ICD-10-CM | POA: Diagnosis not present

## 2018-09-05 DIAGNOSIS — Z20828 Contact with and (suspected) exposure to other viral communicable diseases: Secondary | ICD-10-CM | POA: Insufficient documentation

## 2018-09-05 DIAGNOSIS — F329 Major depressive disorder, single episode, unspecified: Secondary | ICD-10-CM | POA: Diagnosis not present

## 2018-09-05 DIAGNOSIS — S2241XD Multiple fractures of ribs, right side, subsequent encounter for fracture with routine healing: Secondary | ICD-10-CM | POA: Diagnosis not present

## 2018-09-05 DIAGNOSIS — R69 Illness, unspecified: Secondary | ICD-10-CM | POA: Diagnosis not present

## 2018-09-05 DIAGNOSIS — M25521 Pain in right elbow: Secondary | ICD-10-CM | POA: Diagnosis not present

## 2018-09-05 DIAGNOSIS — R918 Other nonspecific abnormal finding of lung field: Secondary | ICD-10-CM | POA: Diagnosis not present

## 2018-09-05 DIAGNOSIS — F321 Major depressive disorder, single episode, moderate: Secondary | ICD-10-CM

## 2018-09-05 DIAGNOSIS — I700 Atherosclerosis of aorta: Secondary | ICD-10-CM | POA: Diagnosis not present

## 2018-09-05 DIAGNOSIS — K6389 Other specified diseases of intestine: Secondary | ICD-10-CM | POA: Diagnosis not present

## 2018-09-05 DIAGNOSIS — R5381 Other malaise: Secondary | ICD-10-CM | POA: Diagnosis not present

## 2018-09-05 DIAGNOSIS — S59901A Unspecified injury of right elbow, initial encounter: Secondary | ICD-10-CM | POA: Diagnosis not present

## 2018-09-05 DIAGNOSIS — I959 Hypotension, unspecified: Secondary | ICD-10-CM | POA: Diagnosis not present

## 2018-09-05 DIAGNOSIS — R109 Unspecified abdominal pain: Secondary | ICD-10-CM | POA: Diagnosis not present

## 2018-09-05 DIAGNOSIS — N281 Cyst of kidney, acquired: Secondary | ICD-10-CM | POA: Diagnosis not present

## 2018-09-05 DIAGNOSIS — F419 Anxiety disorder, unspecified: Secondary | ICD-10-CM | POA: Diagnosis present

## 2018-09-05 LAB — CBC WITH DIFFERENTIAL/PLATELET
Abs Immature Granulocytes: 0.03 10*3/uL (ref 0.00–0.07)
Basophils Absolute: 0 10*3/uL (ref 0.0–0.1)
Basophils Relative: 0 %
Eosinophils Absolute: 0.2 10*3/uL (ref 0.0–0.5)
Eosinophils Relative: 2 %
HCT: 32.6 % — ABNORMAL LOW (ref 39.0–52.0)
Hemoglobin: 10.1 g/dL — ABNORMAL LOW (ref 13.0–17.0)
Immature Granulocytes: 0 %
Lymphocytes Relative: 10 %
Lymphs Abs: 1 10*3/uL (ref 0.7–4.0)
MCH: 31.1 pg (ref 26.0–34.0)
MCHC: 31 g/dL (ref 30.0–36.0)
MCV: 100.3 fL — ABNORMAL HIGH (ref 80.0–100.0)
Monocytes Absolute: 0.8 10*3/uL (ref 0.1–1.0)
Monocytes Relative: 8 %
Neutro Abs: 7.9 10*3/uL — ABNORMAL HIGH (ref 1.7–7.7)
Neutrophils Relative %: 80 %
Platelets: 206 10*3/uL (ref 150–400)
RBC: 3.25 MIL/uL — ABNORMAL LOW (ref 4.22–5.81)
RDW: 15.1 % (ref 11.5–15.5)
WBC: 9.9 10*3/uL (ref 4.0–10.5)
nRBC: 0 % (ref 0.0–0.2)

## 2018-09-05 LAB — URINALYSIS, ROUTINE W REFLEX MICROSCOPIC
Bilirubin Urine: NEGATIVE
Glucose, UA: NEGATIVE mg/dL
Hgb urine dipstick: NEGATIVE
Ketones, ur: NEGATIVE mg/dL
Leukocytes,Ua: NEGATIVE
Nitrite: NEGATIVE
Protein, ur: 30 mg/dL — AB
Specific Gravity, Urine: 1.014 (ref 1.005–1.030)
pH: 5 (ref 5.0–8.0)

## 2018-09-05 LAB — COMPREHENSIVE METABOLIC PANEL
ALT: 15 U/L (ref 0–44)
AST: 14 U/L — ABNORMAL LOW (ref 15–41)
Albumin: 3.6 g/dL (ref 3.5–5.0)
Alkaline Phosphatase: 72 U/L (ref 38–126)
Anion gap: 8 (ref 5–15)
BUN: 42 mg/dL — ABNORMAL HIGH (ref 8–23)
CO2: 23 mmol/L (ref 22–32)
Calcium: 9.1 mg/dL (ref 8.9–10.3)
Chloride: 107 mmol/L (ref 98–111)
Creatinine, Ser: 3.46 mg/dL — ABNORMAL HIGH (ref 0.61–1.24)
GFR calc Af Amer: 18 mL/min — ABNORMAL LOW (ref >60)
GFR calc non Af Amer: 16 mL/min — ABNORMAL LOW (ref >60)
Glucose, Bld: 119 mg/dL — ABNORMAL HIGH (ref 70–99)
Potassium: 3.9 mmol/L (ref 3.5–5.1)
Sodium: 138 mmol/L (ref 135–145)
Total Bilirubin: 0.5 mg/dL (ref 0.3–1.2)
Total Protein: 6.9 g/dL (ref 6.5–8.1)

## 2018-09-05 LAB — LIPASE, BLOOD: Lipase: 26 U/L (ref 11–51)

## 2018-09-05 LAB — SAMPLE TO BLOOD BANK

## 2018-09-05 MED ORDER — TRAMADOL HCL 50 MG PO TABS
50.0000 mg | ORAL_TABLET | Freq: Once | ORAL | Status: AC
  Administered 2018-09-05: 50 mg via ORAL
  Filled 2018-09-05: qty 1

## 2018-09-05 MED ORDER — PREDNISONE 20 MG PO TABS
40.0000 mg | ORAL_TABLET | Freq: Once | ORAL | Status: AC
  Administered 2018-09-05: 40 mg via ORAL
  Filled 2018-09-05: qty 2

## 2018-09-05 MED ORDER — LOPERAMIDE HCL 2 MG PO CAPS: 2.0000 mg | ORAL_CAPSULE | Freq: Four times a day (QID) | ORAL | 0 refills | Status: DC | PRN

## 2018-09-05 MED ORDER — METRONIDAZOLE 500 MG PO TABS
500.0000 mg | ORAL_TABLET | Freq: Once | ORAL | Status: AC
  Administered 2018-09-05: 500 mg via ORAL
  Filled 2018-09-05: qty 1

## 2018-09-05 MED ORDER — CIPROFLOXACIN HCL 250 MG PO TABS
250.0000 mg | ORAL_TABLET | Freq: Once | ORAL | Status: AC
  Administered 2018-09-05: 250 mg via ORAL
  Filled 2018-09-05: qty 1

## 2018-09-05 MED ORDER — ONDANSETRON HCL 4 MG PO TABS
4.0000 mg | ORAL_TABLET | Freq: Once | ORAL | Status: AC
  Administered 2018-09-05: 4 mg via ORAL
  Filled 2018-09-05: qty 1

## 2018-09-05 MED ORDER — PREDNISONE 20 MG PO TABS: 40.0000 mg | ORAL_TABLET | Freq: Every day | ORAL | 0 refills | Status: DC

## 2018-09-05 MED ORDER — LOPERAMIDE HCL 2 MG PO CAPS
4.0000 mg | ORAL_CAPSULE | Freq: Once | ORAL | Status: AC
  Administered 2018-09-05: 4 mg via ORAL
  Filled 2018-09-05: qty 2

## 2018-09-05 MED ORDER — METRONIDAZOLE 500 MG PO TABS: 500.0000 mg | ORAL_TABLET | Freq: Two times a day (BID) | ORAL | 0 refills | Status: DC

## 2018-09-05 MED ORDER — DOXYCYCLINE HYCLATE 100 MG PO CAPS: 100.0000 mg | ORAL_CAPSULE | Freq: Two times a day (BID) | ORAL | 0 refills | Status: DC

## 2018-09-05 NOTE — Discharge Instructions (Signed)
Your vital signs are within normal limits.  The test of your stool is negative for blood at this time.  Your complete blood count is stable.  Your chest x-ray is stable.  Your CT scan suggests some possible inflammation of your colon, consistent with colitis.  Please use doxycycline and Flagyl 2 times daily with food.  Please use prednisone daily with food.  Please have your physician at the nursing facility arrange a gastroenterology consultation as soon as possible concerning this.  Please use Tylenol extra strength for your discomfort.

## 2018-09-05 NOTE — ED Provider Notes (Signed)
[facility] EMERGENCY DEPARTMENT Provider Note   CSN: [id number] Arrival date & time: 09/05/18  1653     History   Chief Complaint Chief Complaint  Patient presents with  . Abdominal Pain    HPI BECKET WECKER is a 84 y.o. male.     Patient is an 84 year old male who presents to the emergency department with a complaint of abdominal pain.  The patient has a history of GERD, he has required a jejunostomy in the past.  History of C. difficile, depression, melanoma, and hypertension.  Patient has a history of tobacco and alcohol abuse in the past.  He continues to smoke cigarettes.  Patient states that he has had problems with his stomach over the past 3 years.  He says that he has bouts with black tarry looking stools as well as abdominal pain.  This problem got worse approximately 230 today.  The patient denies any vomiting, but says he has pain at the lower abdomen and he noticed that his stools were black like tar again.  It is of note that the patient was discharged from the hospital on yesterday, August 28.  [facility]).  He presents to the emergency department for evaluation of his abdominal pain and also for evaluation of discoloration of his stools.  The patient denies being on any anticoagulation medications at this time.  He has not had any excessive amounts of NSAIDs.  No recent injuries, and no operations or procedures involving the abdomen.  The history is provided by the patient.  Abdominal Pain Associated symptoms: diarrhea and shortness of breath   Associated symptoms: no chest pain, no chills, no cough, no dysuria, no fever and no hematuria     Past Medical History:  Diagnosis Date  . Alcohol abuse   . C. difficile diarrhea 11/2017  . Chronic kidney disease (CKD), stage III (moderate) (HCC)   . Depression   . GERD (gastroesophageal reflux disease)   . GI bleed    a. bleeding duodenal ulcers in 04/2018 s/p cauterization and duodenalplasty  .  History of melanoma   . Hypertension   . Osteoarthritis   . PAF (paroxysmal atrial fibrillation) ([place])    a. initially noted on 05/06/2018  . Tobacco abuse     Patient Active Problem List   Diagnosis Date Noted  . Paroxysmal atrial fibrillation (HCC)   . Acute pulmonary edema (HCC)   . Community acquired pneumonia of right lower lobe of lung ([place])   . Acute respiratory failure with hypoxia ([place]) 08/23/2018  . Encounter for competency evaluation 07/28/2018  . History of Clostridioides difficile infection 07/17/2018  . Insufficient social support 06/27/2018  . Abdominal pain 05/28/2018  . GI bleed 05/02/2018  . Hemorrhagic shock ([place]) 05/02/2018  . Altered mental status 05/02/2018  . Anemia due to blood loss, acute 05/02/2018  . RLS (restless legs syndrome) 04/22/2018  . Wrist pain 04/22/2018  . Chronic hip pain 04/08/2018  . Suicidal ideation 03/19/2018  . Protein-calorie malnutrition, severe 12/03/2017  . Closed left hip fracture ([place]) 11/30/2017  . Diarrhea 11/15/2017  . Duodenum disorder 11/15/2017  . Frequency of urination 10/21/2017  . Cigarette smoker 10/06/2017  . Cough 09/25/2017  . Changes in vision 09/25/2017  . Proteinuria 09/25/2017  . Basal cell carcinoma 02/28/2017  . 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 DOE (dyspnea on exertion) 01/17/2017  . Chronic right upper quadrant pain 11/15/2016  . Balance problem 11/15/2016  . Simple renal cyst 10/21/2016  .  Prostate nodule 10/16/2016  . Dysuria 10/16/2016  . CKD (chronic kidney disease) stage 3, GFR 30-59 ml/min (HCC) 08/05/2016  . Anemia 08/05/2016  . Anxiety and depression 08/05/2016  . Weight loss 08/05/2016  . Sleep disturbance 07/23/2016  . Hypertension   . Osteoarthritis   . Tobacco abuse   . 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[place]) 03/06/2016    Past Surgical History:  Procedure Laterality Date  . ANKLE FRACTURE SURGERY    . CATARACT EXTRACTION W/PHACO Right 08/13/2018   Procedure: CATARACT  EXTRACTION PHACO AND INTRAOCULAR LENS PLACEMENT ([place]) RIGHT [place];  Surgeon: Marchia Meiers, MD;  Location: [facility] ORS;  Service: Ophthalmology;  Laterality: Right;  [place]  01:16 CDE 13.15 Fluid pack lot # [id number]  . CENTRAL VENOUS CATHETER INSERTION  05/08/2018   Procedure: INSERTION CENTRAL LINE ADULT;  Surgeon: Olean Ree, MD;  Location: [facility] ORS;  Service: General;;  . ESOPHAGOGASTRODUODENOSCOPY  05/08/2018   Procedure: ESOPHAGOGASTRODUODENOSCOPY (EGD);  Surgeon: Olean Ree, MD;  Location: [facility] ORS;  Service: General;;  . ESOPHAGOGASTRODUODENOSCOPY (EGD) WITH PROPOFOL N/A 05/03/2018   Procedure: ESOPHAGOGASTRODUODENOSCOPY (EGD) WITH PROPOFOL;  Surgeon: Toledo, Benay Pike, MD;  Location: [facility] ENDOSCOPY;  Service: Gastroenterology;  Laterality: N/A;  . EXTERNAL EAR SURGERY    . INTRAMEDULLARY (IM) NAIL INTERTROCHANTERIC Left 12/01/2017   Procedure: INTRAMEDULLARY (IM) NAIL INTERTROCHANTRIC;  Surgeon: Thornton Park, MD;  Location: [facility] ORS;  Service: Orthopedics;  Laterality: Left;  . JEJUNOSTOMY  05/08/2018   Procedure: JEJUNOSTOMY;  Surgeon: Olean Ree, MD;  Location: [facility] ORS;  Service: General;;  . LAPAROTOMY N/A 05/08/2018   Procedure: EXPLORATORY LAPAROTOMY;  Surgeon: Olean Ree, MD;  Location: [facility] ORS;  Service: General;  Laterality: N/A;  . ROTATOR CUFF REPAIR    . TONSILLECTOMY    . TYMPANOPLASTY W/ MASTOIDECTOMY     Patient with reports of mastoid surgery (appears to have had surgery on TM; Left).  . VISCERAL ARTERY INTERVENTION N/A 05/04/2018   Procedure: VISCERAL ARTERY INTERVENTION;  Surgeon: Algernon Huxley, MD;  Location: [place] CV LAB;  Service: Cardiovascular;  Laterality: N/A;        Home Medications    Prior to Admission medications   Medication Sig Start Date End Date Taking? Authorizing Provider  amiodarone (PACERONE) 400 MG tablet Take 0.5 tablets (200 mg total) by mouth 2 (two) times daily. 09/03/18 10/03/18  Saundra Shelling, MD  calcium-vitamin  D (OSCAL WITH D) 500-200 MG-UNIT tablet Take 1 tablet by mouth 2 (two) times daily. 07/16/18   Leone Haven, MD  DUREZOL 0.05 % EMUL Place 1 drop into the right eye daily. 08/21/18   [provider]  ferrous sulfate (FEROSUL) 325 (65 FE) MG tablet Take 1 tablet (325 mg total) by mouth daily. 09/04/18 10/04/18  Saundra Shelling, MD  folic acid (FOLVITE) 1 MG tablet TAKE 1 TABLET BY MOUTH DAILY Patient taking differently: Take 1 mg by mouth daily.  07/14/18   Leone Haven, MD  metoprolol tartrate (LOPRESSOR) 25 MG tablet Take 0.5 tablets (12.5 mg total) by mouth daily. 09/03/18 10/03/18  Saundra Shelling, MD  mirtazapine (REMERON) 30 MG tablet Take 0.5 tablets (15 mg total) by mouth at bedtime. 06/17/18   Loletha Grayer, MD  pantoprazole (PROTONIX) 40 MG tablet TAKE 1 TABLET DAILY AT 7:00 AM ON AN EMPTY STOMACH Patient taking differently: Take 40 mg by mouth daily.  07/14/18   Leone Haven, MD  polyethylene glycol (MIRALAX / GLYCOLAX) 17 g packet Take 17 g by mouth daily.  09/04/18   Saundra Shelling, MD  tamsulosin (FLOMAX) 0.4 MG CAPS capsule TAKE 1 CAPSULE BY MOUTH DAILY Patient taking differently: Take 0.4 mg by mouth daily.  07/14/18   Leone Haven, MD  thiamine 100 MG tablet Take 1 tablet (100 mg total) by mouth daily. 07/16/18   Leone Haven, MD  traZODone (DESYREL) 100 MG tablet Take 1 tablet (100 mg total) by mouth at bedtime. 07/27/18   Leone Haven, MD    Family History Family History  Problem Relation Age of Onset  . Prostate cancer Neg Hx   . Bladder Cancer Neg Hx   . Kidney cancer Neg Hx     Social History Social History   Tobacco Use  . Smoking status: Current Every Day Smoker    Packs/day: 1.00    Years: 70.00    Pack years: 70.00    Types: Cigarettes  . Smokeless tobacco: Never Used  Substance Use Topics  . Alcohol use: No  . Drug use: No     Allergies   Aspirin   Review of Systems Review of Systems  Constitutional: Negative for  activity change, chills and fever.       All ROS Neg except as noted in HPI  HENT: Negative.  Negative for nosebleeds.   Eyes: Negative for photophobia and discharge.  Respiratory: Positive for shortness of breath. Negative for cough and wheezing.   Cardiovascular: Negative for chest pain and palpitations.  Gastrointestinal: Positive for abdominal pain and diarrhea. Negative for blood in stool.       Tarry stool  Genitourinary: Negative for dysuria, frequency and hematuria.  Musculoskeletal: Positive for arthralgias. Negative for back pain and neck pain.  Skin: Negative.        History of melanoma  Neurological: Negative for dizziness, seizures and speech difficulty.  Psychiatric/Behavioral: Negative for confusion and hallucinations.     Physical Exam Updated Vital Signs BP 132/68 (BP Location: Left Arm)   Pulse 73   Temp 97.7 F (36.5 C) (Oral)   Resp 14   SpO2 99%   Physical Exam Vitals signs and nursing note reviewed.  Constitutional:      Appearance: He is well-developed. He is not toxic-appearing.     Comments: Dried stool noted on lower extremities.  HENT:     Head: Normocephalic.     Right Ear: Tympanic membrane and external ear normal.     Left Ear: Tympanic membrane and external ear normal.  Eyes:     General: Lids are normal.     Pupils: Pupils are equal, round, and reactive to light.  Neck:     Musculoskeletal: Normal range of motion and neck supple.     Vascular: No carotid bruit.  Cardiovascular:     Rate and Rhythm: Normal rate and regular rhythm.     Pulses: Normal pulses.     Heart sounds: Normal heart sounds.  Pulmonary:     Effort: No respiratory distress.     Breath sounds: Rhonchi present.     Comments: Coarse breath sounds with occasional rhonchi. Abdominal:     General: Bowel sounds are normal.     Palpations: Abdomen is soft.     Tenderness: There is abdominal tenderness in the right lower quadrant, suprapubic area and left lower quadrant.  There is no guarding.  Genitourinary:    Prostate: Enlarged.     Rectum: Guaiac result negative.  Musculoskeletal: Normal range of motion.  Lymphadenopathy:     Head:  Right side of head: No submandibular adenopathy.     Left side of head: No submandibular adenopathy.     Cervical: No cervical adenopathy.  Skin:    General: Skin is warm and dry.  Neurological:     Mental Status: He is alert and oriented to person, place, and time.     Cranial Nerves: No cranial nerve deficit.     Sensory: No sensory deficit.  Psychiatric:        Mood and Affect: Mood normal.        Speech: Speech normal.      ED Treatments / Results  Labs (all labs ordered are listed, but only abnormal results are displayed) Labs Reviewed  COMPREHENSIVE METABOLIC PANEL  CBC WITH DIFFERENTIAL/PLATELET  URINALYSIS, ROUTINE W REFLEX MICROSCOPIC  LIPASE, BLOOD  POC OCCULT BLOOD, ED  SAMPLE TO BLOOD BANK    EKG None  Radiology No results found.  Procedures Procedures (including critical care time)  Medications Ordered in ED Medications - No data to display   Initial Impression / Assessment and Plan / ED Course  I have reviewed the triage vital signs and the nursing notes.  Pertinent labs & imaging results that were available during my care of the patient were reviewed by me and considered in my medical decision making (see chart for details).          Final Clinical Impressions(s) / ED Diagnoses MDM  Vital signs within normal limits.  Pulse oximetry is 99% on room air.  Within normal limits by my interpretation. Patient is awake and alert and in no distress.  Urine analysis obtained.  Patient has a hazy yellow specimen with a specific gravity of 1.014.  There is rare bacteria present.  Otherwise the urine analysis is negative.  Doubt urinary tract infection or renal colic.  The comprehensive metabolic panel shows the BUN to be elevated at 42, the creatinine elevated at 3.46.  Patient  has a history of chronic kidney disease.  The complete blood count shows the white blood cells to be normal at 9900, the hemoglobin is 10.1, the hematocrit is 32.6, both slightly low.  Platelets are normal at 206,000.  There is no shift to the left.  The lipase is normal at 26.  Stool for occult blood is negative.  Will obtain CT scan of the abdomen due to abdominal discomfort in the right and left lower quadrants of the abdomen.  Chest x-ray is read as unchanged appearance of the chest with cardiomegaly and interstitial prominence present.  CT scan was done without contrast.  There is no definite acute intra-abdominal abnormality detected.  There was mild colonic wall thickening at the level of the hepatic flexure and the distal descending colon.  A low-grade infectious or inflammatory colitis is not excluded.  There was some haziness of the mesentery in the right upper quadrant which appears somewhat similar to a prior study.  Case discussed with Dr. Lacinda Axon.  Patient seen with me by Dr. Lacinda Axon. I spoke with Bridgette Habermann, with social worker protective services.  She inquired if we could make a determination on the level of care that the patient would need at his current facility.  I made [name] aware that we could make a determination about any emergent changes or emergent situations that we would find tonight, but that his primary physician would need to make decisions about his level of care.    Patient will be treated with metronidazole, doxycycline, and prednisone.  I  was notified by the nursing staff that the patient has had 2 episodes of diarrhea.  The report that we received from EMS was that the patient was not having diarrhea, but the nursing facility called to say that the patient is having diarrhea.  Patient treated with Imodium.   C. difficile sample has been sent to the lab.   At discharge, the patient told the nurse that if he is discharged he is going to kill himself.  He  said he is going to find a way to kill himself.  We will obtain a TTS consult and get the necessary labs.  The TTS consult has been completed.  The patient is diagnosed with depression.  Patient will be placed in the geriatric psych placement. Current medications re-ordered. Pt to be reassessed in AM by TTS team.   Final diagnoses:  Colitis  Current moderate episode of major depressive disorder, unspecified whether recurrent [facility])    ED Discharge Orders    None       Lily Kocher, PA-C 09/06/18 1227    Nat Christen, MD 09/06/18 [phone]

## 2018-09-05 NOTE — ED Notes (Signed)
Justin Andrews, SW with [employer] called and reports she has a protective order for pt and she makes all the decisions for him. [phone])  Reports pt is a resident at [facility].  Phone number for Family care home is [phone].

## 2018-09-05 NOTE — ED Triage Notes (Signed)
EMS reports pt c/o abd pain since 6pm.  Reports nausea, no vomiting or diarrhea.  Pt alert and oriented.  CBG 174.  Reports vital signs stable.

## 2018-09-05 NOTE — ED Triage Notes (Signed)
Per ems, pt recently placed in  A family care home in [place].

## 2018-09-05 NOTE — ED Notes (Signed)
RN at bedside removing iv for discharge back to [facility]. Pt states not like it there and does not want to return. Pt then states "you are going to read about me in the paper cause I am going to find a way to kill myself" pt states like isn't worth living anymore. Same conversation reported to [place] and Agricultural consultant. Order for TTS eval and transport services cancelled.

## 2018-09-06 ENCOUNTER — Other Ambulatory Visit: Payer: Self-pay

## 2018-09-06 MED ORDER — PANTOPRAZOLE SODIUM 40 MG PO TBEC
40.0000 mg | DELAYED_RELEASE_TABLET | Freq: Every day | ORAL | Status: DC
  Administered 2018-09-06 – 2018-09-13 (×8): 40 mg via ORAL
  Filled 2018-09-06 (×8): qty 1

## 2018-09-06 MED ORDER — FOLIC ACID 1 MG PO TABS
1.0000 mg | ORAL_TABLET | Freq: Every day | ORAL | Status: DC
  Administered 2018-09-06 – 2018-09-13 (×8): 1 mg via ORAL
  Filled 2018-09-06 (×8): qty 1

## 2018-09-06 MED ORDER — CALCIUM CARBONATE-VITAMIN D 500-200 MG-UNIT PO TABS
1.0000 | ORAL_TABLET | Freq: Two times a day (BID) | ORAL | Status: DC
  Administered 2018-09-06 – 2018-09-13 (×12): 1 via ORAL
  Filled 2018-09-06 (×17): qty 1

## 2018-09-06 MED ORDER — TRAZODONE HCL 50 MG PO TABS
100.0000 mg | ORAL_TABLET | Freq: Every day | ORAL | Status: DC
  Administered 2018-09-06 – 2018-09-12 (×8): 100 mg via ORAL
  Filled 2018-09-06 (×8): qty 2

## 2018-09-06 MED ORDER — PREDNISONE 20 MG PO TABS
40.0000 mg | ORAL_TABLET | Freq: Every day | ORAL | Status: AC
  Administered 2018-09-07 – 2018-09-10 (×4): 40 mg via ORAL
  Filled 2018-09-06 (×4): qty 2

## 2018-09-06 MED ORDER — HYDROXYZINE HCL 25 MG PO TABS
25.0000 mg | ORAL_TABLET | Freq: Once | ORAL | Status: AC
  Administered 2018-09-06: 25 mg via ORAL
  Filled 2018-09-06: qty 1

## 2018-09-06 MED ORDER — METRONIDAZOLE 500 MG PO TABS
500.0000 mg | ORAL_TABLET | Freq: Two times a day (BID) | ORAL | Status: AC
  Administered 2018-09-06 – 2018-09-12 (×14): 500 mg via ORAL
  Filled 2018-09-06 (×14): qty 1

## 2018-09-06 MED ORDER — TRAMADOL HCL 50 MG PO TABS
50.0000 mg | ORAL_TABLET | Freq: Once | ORAL | Status: AC
  Administered 2018-09-06: 50 mg via ORAL
  Filled 2018-09-06: qty 1

## 2018-09-06 MED ORDER — LOPERAMIDE HCL 2 MG PO CAPS: 2.0000 mg | ORAL_CAPSULE | Freq: Four times a day (QID) | ORAL | Status: DC | PRN

## 2018-09-06 MED ORDER — FERROUS SULFATE 300 (60 FE) MG/5ML PO SYRP
220.0000 mg | ORAL_SOLUTION | Freq: Every day | ORAL | Status: DC
  Administered 2018-09-06 – 2018-09-13 (×8): 220 mg via ORAL
  Filled 2018-09-06 (×9): qty 5

## 2018-09-06 MED ORDER — DOXYCYCLINE HYCLATE 100 MG PO TABS
100.0000 mg | ORAL_TABLET | Freq: Two times a day (BID) | ORAL | Status: AC
  Administered 2018-09-06 – 2018-09-12 (×14): 100 mg via ORAL
  Filled 2018-09-06 (×14): qty 1

## 2018-09-06 MED ORDER — AMIODARONE HCL 200 MG PO TABS
200.0000 mg | ORAL_TABLET | Freq: Every day | ORAL | Status: DC
  Administered 2018-09-06 – 2018-09-13 (×8): 200 mg via ORAL
  Filled 2018-09-06 (×8): qty 1

## 2018-09-06 MED ORDER — METOPROLOL TARTRATE 25 MG PO TABS
12.5000 mg | ORAL_TABLET | Freq: Every day | ORAL | Status: DC
  Administered 2018-09-06 – 2018-09-13 (×7): 12.5 mg via ORAL
  Filled 2018-09-06 (×8): qty 1

## 2018-09-06 MED ORDER — ACETAMINOPHEN 325 MG PO TABS
650.0000 mg | ORAL_TABLET | ORAL | Status: DC | PRN
  Administered 2018-09-08 – 2018-09-09 (×2): 650 mg via ORAL
  Filled 2018-09-06 (×2): qty 2

## 2018-09-06 NOTE — BH Assessment (Signed)
Tele Assessment Note   Patient Name: Justin Andrews MRN: [id number] Referring Physician: Lily Kocher, PA-C Location of Patient: [facility] Location of Provider: [place] Department   Patient is a 84 year old male.  Patient reports SI with a plan.  Per documentation in the epic chart, Patient reports that "you are going to read about me in the paper because I am going to find a way to kill myself".  Patient reports that, "life isn't worth living anymore".  Patient reports chronic abdomen pain and black stool for several years.    Patient denies prior suicide attempts.  Patient denies prior psychiatric inpatient hospitalization.  Patient reports that he stopped taking psychiatric medication in the 1990's.  Patient denies HI/Psychosis and Substance Abuse.     Per chart review, patient states that he has had problems with his stomach over the past 3 years.  He says that he has bouts with black tarry looking stools as well as abdominal pain.    Patient was recently placed at [facility] in [place].  The number for the [facility] is [phone]. Bridgette Habermann is the Education officer, museum with [employer] APS called and reports that she has a protective order for the patient and she makes all the decisions for him. The phone number for Darrick Grinder is [phone].       Diagnosis: Depression   Past Medical History:  Past Medical History:  Diagnosis Date  . Alcohol abuse   . C. difficile diarrhea 11/2017  . Chronic kidney disease (CKD), stage III (moderate) (HCC)   . Depression   . GERD (gastroesophageal reflux disease)   . GI bleed    a. bleeding duodenal ulcers in 04/2018 s/p cauterization and duodenalplasty  . History of melanoma   . Hypertension   . Osteoarthritis   . PAF (paroxysmal atrial fibrillation) ([place])    a. initially noted on 05/06/2018  . Tobacco abuse     Past Surgical History:  Procedure Laterality Date  . ANKLE FRACTURE SURGERY    .  CATARACT EXTRACTION W/PHACO Right 08/13/2018   Procedure: CATARACT EXTRACTION PHACO AND INTRAOCULAR LENS PLACEMENT ([place]) RIGHT [place];  Surgeon: Marchia Meiers, MD;  Location: [facility] ORS;  Service: Ophthalmology;  Laterality: Right;  [place]  01:16 CDE 13.15 Fluid pack lot # [id number]  . CENTRAL VENOUS CATHETER INSERTION  05/08/2018   Procedure: INSERTION CENTRAL LINE ADULT;  Surgeon: Olean Ree, MD;  Location: [facility] ORS;  Service: General;;  . ESOPHAGOGASTRODUODENOSCOPY  05/08/2018   Procedure: ESOPHAGOGASTRODUODENOSCOPY (EGD);  Surgeon: Olean Ree, MD;  Location: [facility] ORS;  Service: General;;  . ESOPHAGOGASTRODUODENOSCOPY (EGD) WITH PROPOFOL N/A 05/03/2018   Procedure: ESOPHAGOGASTRODUODENOSCOPY (EGD) WITH PROPOFOL;  Surgeon: [place], Benay Pike, MD;  Location: [facility] ENDOSCOPY;  Service: Gastroenterology;  Laterality: N/A;  . EXTERNAL EAR SURGERY    . INTRAMEDULLARY (IM) NAIL INTERTROCHANTERIC Left 12/01/2017   Procedure: INTRAMEDULLARY (IM) NAIL INTERTROCHANTRIC;  Surgeon: [place], MD;  Location: [facility] ORS;  Service: Orthopedics;  Laterality: Left;  . JEJUNOSTOMY  05/08/2018   Procedure: JEJUNOSTOMY;  Surgeon: Olean Ree, MD;  Location: [facility] ORS;  Service: General;;  . LAPAROTOMY N/A 05/08/2018   Procedure: EXPLORATORY LAPAROTOMY;  Surgeon: Olean Ree, MD;  Location: [facility] ORS;  Service: General;  Laterality: N/A;  . ROTATOR CUFF REPAIR    . TONSILLECTOMY    . TYMPANOPLASTY W/ MASTOIDECTOMY     Patient with reports of mastoid surgery (appears to have had surgery on TM; Left).  Marland Kitchen  VISCERAL ARTERY INTERVENTION N/A 05/04/2018   Procedure: VISCERAL ARTERY INTERVENTION;  Surgeon: Algernon Huxley, MD;  Location: [facility] [place] CV LAB;  Service: Cardiovascular;  Laterality: N/A;    Family History:  Family History  Problem Relation Age of Onset  . Prostate cancer Neg Hx   . Bladder Cancer Neg Hx   . Kidney cancer Neg Hx     Social History:  reports that he has been smoking cigarettes.  He has a 70.00 pack-year smoking history. He has never used smokeless tobacco. He reports that he does not drink alcohol or use drugs.  Additional Social History:  Alcohol / Drug Use History of alcohol / drug use?: No history of alcohol / drug abuse  CIWA: CIWA-Ar BP: (!) 146/72 Pulse Rate: 74 COWS:    Allergies:  Allergies  Allergen Reactions  . Aspirin Other (See Comments)    bleeding    Home Medications: (Not in a hospital admission)   OB/GYN Status:  No LMP for male patient.  General Assessment Data Location of Assessment: [facility] TTS Assessment: In system Is this a Tele or Face-to-Face Assessment?: Tele Assessment Is this an Initial Assessment or a Re-assessment for this encounter?: Initial Assessment Patient Accompanied by:: Other(EMS brought the patient to the ED) Language Other than English: No Living Arrangements: Other (Comment)([place] ) What gender do you identify as?: Male Marital status: Single Maiden name: n Pregnancy Status: No Living Arrangements: Group Home Can pt return to current living arrangement?: Yes Admission Status: Voluntary Is patient capable of signing voluntary admission?: Yes Referral Source: Self/Family/Friend Insurance type: Medicare     Crisis Care Plan Living Arrangements: Group Home Legal Guardian: Darrick Grinder, LCSW) Name of Psychiatrist: (None Reported) Name of Therapist: (None Reported)  Education Status Is patient currently in school?: No Is the patient employed, unemployed or receiving disability?: Receiving disability income  Risk to self with the past 6 months Suicidal Ideation: Yes-Currently Present Has patient been a risk to self within the past 6 months prior to admission? : Yes Suicidal Intent: Yes-Currently Present Has patient had any suicidal intent within the past 6 months prior to admission? : Yes Is patient at risk for suicide?: Yes Suicidal Plan?: Yes-Currently Present Has patient had  any suicidal plan within the past 6 months prior to admission? : Yes Specify Current Suicidal Plan: [name] reports that he will find a way to harm himself  Access to Means: Yes Specify Access to Suicidal Means: anything in the group home What has been your use of drugs/alcohol within the last 12 months?: None Reported Previous Attempts/Gestures: No How many times?: 0 Other Self Harm Risks: n Triggers for Past Attempts: None known Intentional Self Injurious Behavior: None Family Suicide History: No Recent stressful life event(s): Recent negative physical changes, Other (Comment) Persecutory voices/beliefs?: (Medical issues; Chronic pain ) Depression: Yes Depression Symptoms: Despondent, Insomnia, Tearfulness, Isolating, Fatigue, Guilt, Loss of interest in usual pleasures, Feeling worthless/self pity, Feeling angry/irritable Substance abuse history and/or treatment for substance abuse?: No Suicide prevention information given to non-admitted patients: Yes  Risk to Others within the past 6 months Homicidal Ideation: No Does patient have any lifetime risk of violence toward others beyond the six months prior to admission? : No Thoughts of Harm to Others: No Current Homicidal Intent: No Current Homicidal Plan: No Access to Homicidal Means: No Identified Victim: NA History of harm to others?: No Assessment of Violence: None Noted Violent Behavior Description: NA Does patient have access to weapons?:  No Criminal Charges Pending?: No Does patient have a court date: No Is patient on probation?: No  Psychosis Hallucinations: None noted Delusions: None noted  Mental Status Report Appearance/Hygiene: In scrubs Eye Contact: Fair Motor Activity: Freedom of movement, Restlessness Speech: Logical/coherent Level of Consciousness: Alert Mood: Depressed, Despair, Helpless Affect: Blunted, Depressed Anxiety Level: Minimal Thought Processes: Coherent, Relevant Judgement:  Unimpaired Orientation: Person, Place, Time, Situation Obsessive Compulsive Thoughts/Behaviors: None  Cognitive Functioning Concentration: Decreased Memory: Recent Intact, Remote Intact Is patient IDD: No Insight: Fair Impulse Control: Poor Appetite: Fair  ADLScreening [facility] Assessment Services) Patient's cognitive ability adequate to safely complete daily activities?: Yes Patient able to express need for assistance with ADLs?: Yes Independently performs ADLs?: Yes (appropriate for developmental age)  Prior Inpatient Therapy Prior Inpatient Therapy: No  Prior Outpatient Therapy Prior Outpatient Therapy: No Does patient have an ACCT team?: No Does patient have Intensive In-House Services?  : No Does patient have [facility] services? : No Does patient have P4CC services?: No  ADL Screening (condition at time of admission) Patient's cognitive ability adequate to safely complete daily activities?: Yes Is the patient deaf or have difficulty hearing?: No Does the patient have difficulty seeing, even when wearing glasses/contacts?: No Does the patient have difficulty concentrating, remembering, or making decisions?: Yes Patient able to express need for assistance with ADLs?: Yes Does the patient have difficulty dressing or bathing?: No Independently performs ADLs?: Yes (appropriate for developmental age) Does the patient have difficulty walking or climbing stairs?: No Weakness of Legs: None Weakness of Arms/Hands: None  Home Assistive Devices/Equipment Home Assistive Devices/Equipment: None    Abuse/Neglect Assessment (Assessment to be complete while patient is alone) Physical Abuse: Denies Verbal Abuse: Denies Sexual Abuse: Denies Exploitation of patient/patient's resources: Denies Self-Neglect: Denies Values / Beliefs Cultural Requests During Hospitalization: None Spiritual Requests During Hospitalization: None Consults Spiritual Care Consult Needed: No Social Work  Consult Needed: No Regulatory affairs officer (For Healthcare) Does Patient Have a Medical Advance Directive?: No Would patient like information on creating a medical advance directive?: No - Patient declined          Disposition: Per NP, Corene Cornea patient meets criteria for inpatient [facility].  CSW will seek placement.    Disposition Initial Assessment Completed for this Encounter: Yes  This service was provided via telemedicine using a 2-way, interactive audio and video technology.  Names of all persons participating in this telemedicine service and their role in this encounter. Name: Justin Andrews L. Johnnye Sima  Role: Arrington, Iselin  Name: Cain Sieve  Role: Patient           Rene Paci 09/06/2018 12:03 AM

## 2018-09-06 NOTE — ED Notes (Signed)
Gave pt meal tray per request.

## 2018-09-06 NOTE — ED Notes (Signed)
Contacted [place] and Sam at [facility] stated patient was going to be geri-psych placement.

## 2018-09-06 NOTE — Progress Notes (Signed)
Patient meets criteria for inpatient treatment. No appropriate or available beds at [facility]. CSW faxed referrals to the following facilities for review:  [facility]  [place]   TTS will continue to seek bed placement.  Chalmers Guest. Guerry Bruin, MSW, [place] Work/Disposition Phone: [phone] Fax: [phone]

## 2018-09-06 NOTE — ED Notes (Signed)
Patient given a microwave meal and some coffee.

## 2018-09-06 NOTE — BH Assessment (Signed)
Disposition: Per NP, Justin Andrews patient meets criteria for inpatient [facility].  CSW will seek placement.

## 2018-09-07 MED ORDER — TRAMADOL HCL 50 MG PO TABS
50.0000 mg | ORAL_TABLET | Freq: Once | ORAL | Status: AC
  Administered 2018-09-07: 50 mg via ORAL
  Filled 2018-09-07: qty 1

## 2018-09-07 NOTE — ED Notes (Signed)
Patient alert and cooperative.

## 2018-09-07 NOTE — Progress Notes (Addendum)
CSW attempted to communicate with pt's DSS social worker, Darrick Grinder [phone]) but was unable to leave a voice message as her voice mailbox is full. CSW will continue to follow.   Rhae [place] Disposition CSW [facility] BHH/TTS [phone]  11:30am: Ms Rosana Berger from [facility] called CSW back. She reports that DSS is pt's legal guardian and that a court date has been set to deem pt incompetent. She denies that pt has any dementia related diagnosis. Ms Rosana Berger states that pt has been placed in several ALFs and has a history of voicing his unhappiness with each one.

## 2018-09-07 NOTE — ED Notes (Signed)
Patient's social worker called to find out why patient was not being discharged. Patient gave verbal permission to talk to Education officer, museum. She wanted to talk to the social worker that was trying to find placement for Justin Andrews.

## 2018-09-07 NOTE — BH Assessment (Addendum)
Pt was re-assessed by TTS.  Pt was calm and cooperative. Pt denies SI, HI, AVH. Pt states that he is suicidal when he is at his new assisted living facility because it is "terrible". He said that when he was there, he was having thoughts of "ending it all, getting out of my misery". Pt states that he had no specific plan.  When asked if he has felt that way in the ED, pt states,  "I am fine here". He states that he has not spoken with his Education officer, museum, but, "I understand that she called". Pt's mood was depressed, affect congruent. Pt was oriented x4.  09/07/18 Per Ricky Ala, NP, continue seeking placement and call pt's SW.

## 2018-09-08 ENCOUNTER — Emergency Department (HOSPITAL_COMMUNITY): Payer: Medicare HMO

## 2018-09-08 ENCOUNTER — Telehealth: Payer: Self-pay

## 2018-09-08 DIAGNOSIS — S59901A Unspecified injury of right elbow, initial encounter: Secondary | ICD-10-CM | POA: Diagnosis not present

## 2018-09-08 DIAGNOSIS — M25521 Pain in right elbow: Secondary | ICD-10-CM | POA: Diagnosis not present

## 2018-09-08 LAB — BASIC METABOLIC PANEL
Anion gap: 7 (ref 5–15)
BUN: 40 mg/dL — ABNORMAL HIGH (ref 8–23)
CO2: 24 mmol/L (ref 22–32)
Calcium: 8.6 mg/dL — ABNORMAL LOW (ref 8.9–10.3)
Chloride: 107 mmol/L (ref 98–111)
Creatinine, Ser: 2.93 mg/dL — ABNORMAL HIGH (ref 0.61–1.24)
GFR calc Af Amer: 22 mL/min — ABNORMAL LOW (ref >60)
GFR calc non Af Amer: 19 mL/min — ABNORMAL LOW (ref >60)
Glucose, Bld: 104 mg/dL — ABNORMAL HIGH (ref 70–99)
Potassium: 4.9 mmol/L (ref 3.5–5.1)
Sodium: 138 mmol/L (ref 135–145)

## 2018-09-08 MED ORDER — LIDOCAINE HCL (PF) 1 % IJ SOLN
5.0000 mL | Freq: Once | INTRAMUSCULAR | Status: AC
  Administered 2018-09-08: 5 mL
  Filled 2018-09-08: qty 6

## 2018-09-08 MED ORDER — LORAZEPAM 1 MG PO TABS
1.0000 mg | ORAL_TABLET | Freq: Once | ORAL | Status: AC
  Administered 2018-09-08: 1 mg via ORAL
  Filled 2018-09-08: qty 1

## 2018-09-08 NOTE — Progress Notes (Signed)
Pt continues to meet inpatient criteria. Referral information has been refaxed to the following hospitals for review:  [facility]  [facility]  [place]   Disposition will continue to follow for placement needs.   Audree Camel, LCSW, [place] Disposition [facility] [facility] BHH/TTS [phone]

## 2018-09-08 NOTE — ED Notes (Signed)
ED Provider at bedside. 

## 2018-09-08 NOTE — ED Notes (Signed)
   09/08/18 1607  What Happened  Was fall witnessed? Yes  Who witnessed fall? AVAS   Patients activity before fall ambulating-unassisted  Point of contact other (comment)  Was patient injured? Yes

## 2018-09-08 NOTE — ED Notes (Signed)
Spoke with pt's son Arca after receiving verbal consent from pt to discuss pt's care

## 2018-09-08 NOTE — Telephone Encounter (Signed)
Copied from [place] [phone]. Topic: General - Inquiry >> Sep 08, 2018  9:57 AM Virl Axe D wrote: Reason for CRM: Langley Gauss with [facility] stated they received request from [facility] for Hospice services for pt. They need to know if Dr. Caryl Bis is in agreeance. Please advise. Cb#[phone] Option 2

## 2018-09-08 NOTE — ED Notes (Signed)
PT's DSS case manager Bridgette Habermann called at this time and updated on pt's status and I gave her [facility] number bc they had noted they couldn't get in contact with her.

## 2018-09-08 NOTE — Telephone Encounter (Signed)
I called and spoke with stacy and informed her that Dr. Caryl Bis is in agreement that the patient needs hospice, she stated she would relay the message to [facility].  Sven Pinheiro,cma

## 2018-09-08 NOTE — ED Provider Notes (Signed)
This patient is currently pending placement for psychiatry.  He has suicidal thoughts in relation to his current living situation, according to notes from yesterday per the nurse practitioner they are continuing to seek placement though the patient is not actively suicidal.  He has been getting his daily medications, his repeat metabolic panel is ordered this morning by myself shows no signs of worsening renal function in fact it looks better than it had prior.  He is at his baseline creatinine.  We will continue to observe in the ED pending psychiatric placement   Justin Chapel, MD 09/08/18 [phone]

## 2018-09-08 NOTE — ED Provider Notes (Signed)
I was informed by the nurse that the patient had fallen try to get out of bed.  Upon the patient sitting in bed with some blood at his left elbow.  He is approximately a 1 cm laceration at the olecranon.  Reported an x-ray he will probably need a suture placed.  Marland Kitchen.Laceration Repair  Date/Time: 09/08/2018 5:16 PM Performed by: Hayden Rasmussen, MD Authorized by: Hayden Rasmussen, MD   Consent:    Consent obtained:  Verbal   Consent given by:  Patient   Risks discussed:  Infection, pain, poor cosmetic result, poor wound healing and retained foreign body   Alternatives discussed:  No treatment and delayed treatment Anesthesia (see MAR for exact dosages):    Anesthesia method:  Local infiltration   Local anesthetic:  Lidocaine 1% w/o epi Laceration details:    Location:  Shoulder/arm   Shoulder/arm location:  R elbow   Length (cm):  2 Repair type:    Repair type:  Simple Pre-procedure details:    Preparation:  Patient was prepped and draped in usual sterile fashion Treatment:    Area cleansed with:  Saline Skin repair:    Repair method:  Sutures   Suture size:  4-0   Suture material:  Nylon   Suture technique:  Simple interrupted   Number of sutures:  2 Approximation:    Approximation:  Close Post-procedure details:    Dressing:  Sterile dressing   Patient tolerance of procedure:  Tolerated well, no immediate complications      Hayden Rasmussen, MD 09/09/18 [phone]

## 2018-09-08 NOTE — ED Notes (Addendum)
Pt fell after getting up to throw something away per report by another nurse.  Pt with lac to right elbow, cleaned with wound cleaner. EDP made aware, vitals taken.   Pt had non slip socks on at time of fall and reported no water noted in floor at time of fall as well.

## 2018-09-08 NOTE — ED Notes (Signed)
   09/08/18 1607  Vitals  BP 135/64  BP Location Right Arm  BP Method Automatic  Patient Position (if appropriate) Sitting  Pulse Rate 69  Pulse Rate Source Dinamap  Resp 14  Oxygen Therapy  SpO2 100 %  O2 Device Room Air  MEWS Score  MEWS RR 0  MEWS Pulse 0  MEWS Systolic 0  MEWS LOC 0  MEWS Temp 0  MEWS Score 0  MEWS Score Color Green

## 2018-09-09 ENCOUNTER — Emergency Department (HOSPITAL_COMMUNITY): Payer: Medicare HMO

## 2018-09-09 DIAGNOSIS — R918 Other nonspecific abnormal finding of lung field: Secondary | ICD-10-CM | POA: Diagnosis not present

## 2018-09-09 DIAGNOSIS — S2241XD Multiple fractures of ribs, right side, subsequent encounter for fracture with routine healing: Secondary | ICD-10-CM | POA: Diagnosis not present

## 2018-09-09 DIAGNOSIS — I700 Atherosclerosis of aorta: Secondary | ICD-10-CM | POA: Diagnosis not present

## 2018-09-09 DIAGNOSIS — I517 Cardiomegaly: Secondary | ICD-10-CM | POA: Diagnosis not present

## 2018-09-09 LAB — COMPREHENSIVE METABOLIC PANEL
ALT: 11 U/L (ref 0–44)
AST: 10 U/L — ABNORMAL LOW (ref 15–41)
Albumin: 3 g/dL — ABNORMAL LOW (ref 3.5–5.0)
Alkaline Phosphatase: 54 U/L (ref 38–126)
Anion gap: 6 (ref 5–15)
BUN: 43 mg/dL — ABNORMAL HIGH (ref 8–23)
CO2: 21 mmol/L — ABNORMAL LOW (ref 22–32)
Calcium: 8.2 mg/dL — ABNORMAL LOW (ref 8.9–10.3)
Chloride: 105 mmol/L (ref 98–111)
Creatinine, Ser: 2.89 mg/dL — ABNORMAL HIGH (ref 0.61–1.24)
GFR calc Af Amer: 22 mL/min — ABNORMAL LOW (ref >60)
GFR calc non Af Amer: 19 mL/min — ABNORMAL LOW (ref >60)
Glucose, Bld: 208 mg/dL — ABNORMAL HIGH (ref 70–99)
Potassium: 4.5 mmol/L (ref 3.5–5.1)
Sodium: 132 mmol/L — ABNORMAL LOW (ref 135–145)
Total Bilirubin: 0.2 mg/dL — ABNORMAL LOW (ref 0.3–1.2)
Total Protein: 6 g/dL — ABNORMAL LOW (ref 6.5–8.1)

## 2018-09-09 LAB — CBC WITH DIFFERENTIAL/PLATELET
Abs Immature Granulocytes: 0.02 10*3/uL (ref 0.00–0.07)
Basophils Absolute: 0 10*3/uL (ref 0.0–0.1)
Basophils Relative: 0 %
Eosinophils Absolute: 0 10*3/uL (ref 0.0–0.5)
Eosinophils Relative: 0 %
HCT: 30.5 % — ABNORMAL LOW (ref 39.0–52.0)
Hemoglobin: 9.4 g/dL — ABNORMAL LOW (ref 13.0–17.0)
Immature Granulocytes: 0 %
Lymphocytes Relative: 9 %
Lymphs Abs: 0.5 10*3/uL — ABNORMAL LOW (ref 0.7–4.0)
MCH: 30.6 pg (ref 26.0–34.0)
MCHC: 30.8 g/dL (ref 30.0–36.0)
MCV: 99.3 fL (ref 80.0–100.0)
Monocytes Absolute: 0.2 10*3/uL (ref 0.1–1.0)
Monocytes Relative: 4 %
Neutro Abs: 4.6 10*3/uL (ref 1.7–7.7)
Neutrophils Relative %: 87 %
Platelets: 202 10*3/uL (ref 150–400)
RBC: 3.07 MIL/uL — ABNORMAL LOW (ref 4.22–5.81)
RDW: 15 % (ref 11.5–15.5)
WBC: 5.4 10*3/uL (ref 4.0–10.5)
nRBC: 0 % (ref 0.0–0.2)

## 2018-09-09 MED ORDER — LORAZEPAM 1 MG PO TABS
1.0000 mg | ORAL_TABLET | Freq: Once | ORAL | Status: AC
  Administered 2018-09-09: 1 mg via ORAL
  Filled 2018-09-09: qty 1

## 2018-09-09 NOTE — ED Notes (Signed)
Pt assisted to BSC and back to bed 

## 2018-09-09 NOTE — ED Notes (Signed)
Assisted patient to bathroom.

## 2018-09-09 NOTE — ED Notes (Signed)
Assisted patient bathroom and lower patient head on bed

## 2018-09-09 NOTE — BHH Counselor (Signed)
  Pt was reassessed today.  He was sitting upright and coughing.  Pt complained of chest congestion.  When asked about feeling suicidal, Pt stated that he did not feel suicidal today.  Author asked Pt where he would go if discharged.  Pt stated, ''I have no earthly idea.''  Pt stated that he would be suicidal again if returned to [facility] in [place].  Continued inpt.  From assessment:  84 year old male.  Patient reports SI with a plan.  Per documentation in the epic chart, Patient reports that "you are going to read about me in the paper because I am going to find a way to kill myself".  Patient reports that, "life isn't worth living anymore".  Patient reports chronic abdomen pain and black stool for several years.    Patient was recently placed at [facility] in [place].  The number for the [facility] is [phone]. Bridgette Habermann is the Education officer, museum with [employer] APS called and reports that she has a protective order for the patient and she makes all the decisions for him. The phone number for Darrick Grinder is [phone].

## 2018-09-09 NOTE — ED Notes (Signed)
Pt had a bm in the bedside commode. Pt left some bm on his bottom and got back in the bed. Pt's linens and clothes had to be changed due to pt not wiping his bottom when he was finished. Pt is able to stand and walk.

## 2018-09-09 NOTE — ED Notes (Signed)
In to assess pt.  Pt is talking to his lawyer on the telephone Jenean Lindau.  Pt Sats are 100% on RA and no resp. Distress noted.

## 2018-09-09 NOTE — ED Notes (Signed)
Pt states that he can not breathe good. Nurse was notified.

## 2018-09-09 NOTE — ED Notes (Signed)
Pt is asking to speak to [facility], [place] [phone], no answer and mailbox full, not able to leave message.  Will try again later.

## 2018-09-09 NOTE — ED Notes (Signed)
Patient keep calling asking to put his head up and down patient asking that he want his food  open

## 2018-09-09 NOTE — ED Notes (Addendum)
Pt is c/o feeling anxious after talking to lawyer.  Pt wants to inform [place] (SW) that he has a court appearance for Tuesday.  Pt is eating lunch at this time.

## 2018-09-09 NOTE — ED Notes (Signed)
Spoke to [employer] regarding new TTS assessment.

## 2018-09-09 NOTE — ED Notes (Signed)
Pt's clothes and bed were changed.

## 2018-09-09 NOTE — ED Notes (Signed)
Pt ask for I cream gave it to him

## 2018-09-09 NOTE — BH Assessment (Addendum)
Patient continues to meet criteria for inpatient treatment. Patient re-faxed to [facility] [place], [facility], [place], [place], [facility], [place], [place], etc for consideration. Received a call from the [facility] intake staff stating that they would have an available male bed 09/10/2018 and would consider patient pending updated EKG, chest xray, COVID-19 results, urinalysis, CMP/CBC ([place] request updated results every 72 results).

## 2018-09-09 NOTE — ED Notes (Signed)
C/o congestion, Sats 98% on room air.  Pt speech is clear.  Pt has constantly called staff in to talk to them asking for things, (adjust head of bed, turn television, etc).  Will continue to monitor Sats and complaints.  No distress noted.

## 2018-09-10 ENCOUNTER — Ambulatory Visit: Admit: 2018-09-10 | Payer: Medicare HMO

## 2018-09-10 ENCOUNTER — Ambulatory Visit: Payer: Medicare HMO | Admitting: Family

## 2018-09-10 LAB — SARS CORONAVIRUS 2 BY RT PCR (HOSPITAL ORDER, PERFORMED IN ~~LOC~~ HOSPITAL LAB): SARS Coronavirus 2: NEGATIVE

## 2018-09-10 LAB — URINALYSIS, ROUTINE W REFLEX MICROSCOPIC
Bilirubin Urine: NEGATIVE
Glucose, UA: NEGATIVE mg/dL
Hgb urine dipstick: NEGATIVE
Ketones, ur: NEGATIVE mg/dL
Leukocytes,Ua: NEGATIVE
Nitrite: NEGATIVE
Protein, ur: NEGATIVE mg/dL
Specific Gravity, Urine: 1.015 (ref 1.005–1.030)
pH: 6 (ref 5.0–8.0)

## 2018-09-10 SURGERY — PHACOEMULSIFICATION, CATARACT, WITH IOL INSERTION
Anesthesia: Topical | Laterality: Left

## 2018-09-10 MED ORDER — TRAMADOL HCL 50 MG PO TABS
50.0000 mg | ORAL_TABLET | Freq: Two times a day (BID) | ORAL | Status: DC | PRN
  Administered 2018-09-10 – 2018-09-13 (×5): 50 mg via ORAL
  Filled 2018-09-10 (×6): qty 1

## 2018-09-10 MED ORDER — LORAZEPAM 1 MG PO TABS
1.0000 mg | ORAL_TABLET | Freq: Once | ORAL | Status: AC
  Administered 2018-09-10: 1 mg via ORAL
  Filled 2018-09-10: qty 1

## 2018-09-10 NOTE — Progress Notes (Signed)
CSW has faxed requested lab work results and x rays to [place] for review.  Disposition will continue to follow.   Audree Camel, LCSW, [place] Disposition [facility] BHH/TTS [phone]

## 2018-09-10 NOTE — ED Provider Notes (Signed)
Per psych recommendations - CXR (no acute fidnings - improving), Covid and EKG have been done - no sig findings - neg for covid   Justin Chapel, MD 09/10/18 [phone]

## 2018-09-10 NOTE — BHH Counselor (Signed)
  REASSESSMENT  [place] reevaluated pt for safety and stability.  Pt was observed laying in the bed, alert, and oriented x2  Pt stated, "I going to kill myself if I go back to that place."  Pt denies HI.  Pt states, "no, I don't want to hurt anybody.  I love everybody."  Pt denies, A/V-hallucination.  [facility] consulted with [facility] Provider, Mordecai Maes, NP who recommends inpatient treatment due to pt's continual endorsement of suicidal ideations.  TTS will continue to look for placement.  Laney Louderback L. [place], [place], [facility], [facility] Therapeutic Triage Specialist  [phone]

## 2018-09-10 NOTE — ED Notes (Signed)
Per Judson Roch at [facility], [place] declined pt because of increased creatinine levels and chronic bleeding condition.

## 2018-09-10 NOTE — ED Notes (Signed)
Pt placed in hospital bed

## 2018-09-11 ENCOUNTER — Telehealth: Payer: Self-pay | Admitting: *Deleted

## 2018-09-11 MED ORDER — HYDROXYZINE HCL 10 MG PO TABS
10.0000 mg | ORAL_TABLET | Freq: Three times a day (TID) | ORAL | Status: DC | PRN
  Administered 2018-09-11 – 2018-09-13 (×3): 10 mg via ORAL
  Filled 2018-09-11 (×6): qty 1

## 2018-09-11 MED ORDER — LORAZEPAM 1 MG PO TABS
1.0000 mg | ORAL_TABLET | Freq: Once | ORAL | Status: AC
  Administered 2018-09-11: 1 mg via ORAL
  Filled 2018-09-11: qty 1

## 2018-09-11 NOTE — Telephone Encounter (Signed)
Patient's son called stating his dad is in the hospital at [facility], and the charge nurse advised him that his father has been referred for placement at a hospice facility.  He states he's father's social worker told him his father is supposed to be discharged to a mental health facility.  He would like to speak with Dr. Caryl Bis to get clarity on his father's medical conditions and prognosis.  He states he was unaware that his father's melanoma had progressed or that he needed hospice.  Please call patient's son Justin Andrews at [phone].

## 2018-09-11 NOTE — Progress Notes (Signed)
CSW received a return phone call from pt's APS social work Librarian, academic, [phone]) Kizzie Bane (DSS is legal guardian). She is aware that pt's PCP has recommended Hospice and voiced understanding that pt will not be able to receive psychiatric inpatient services if receiving Hospice services. She requested that if pt begins receiving Hospice this weekend, to please provide her contact information.   Audree Camel, LCSW, [place] Disposition [facility] BHH/TTS [phone]

## 2018-09-11 NOTE — Consult Note (Signed)
Telepsych Consultation   Reason for Consult:  SI Referring Physician:  EDP Location of Patient:  Location of Provider: [place] Department  Patient Identification: Justin Andrews MRN:  [id number] Principal Diagnosis: Anxiety and depression Diagnosis:  Principal Problem:   Anxiety and depression   Total Time spent with patient: 30 minutes  Subjective:   Justin Andrews is a 84 y.o. male patient admitted with suicidal ideations with a plan to "find a way to kill myself." Patient is currently living at [facility], states "its isolated over there, nothing but rocks and trees around." Patient verbalizes "it's isolated here (in the hospital) but they do wheel me outside once and that is nice." Patient currently denies SI, HI and AVH, contracts for safety, "I can keep myself safe at the facility until I find somewhere else to go, I promise."   HPI: Per TTS evaluation: Patient is a 84 year old male.  Patient reports SI with a plan.  Per documentation in the epic chart, Patient reports that "you are going to read about me in the paper because I am going to find a way to kill myself".  Patient reports that, "life isn't worth living anymore".  Patient reports chronic abdomen pain and black stool for several years.  Patient denies prior suicide attempts.  Patient denies prior psychiatric inpatient hospitalization.  Patient reports that he stopped taking psychiatric medication in the 1990's.  Patient denies HI/Psychosis and Substance Abuse.  Per chart review, patient states that he has had problems with his stomach over the past 3 years. He says that he has bouts with black tarry looking stools as well as abdominal pain.  Patient reports hearing difficulty, able to communicate with raised voice, Upon my evaluation patient alert and oriented x4, answers questions appropriately. Patient denies SI, SI and AVH. Verbalizes desire to return to facility while new placement is  investigated.   Past Psychiatric History: Depression  Risk to Self: Suicidal Ideation: Yes-Currently Present Suicidal Intent: Yes-Currently Present Is patient at risk for suicide?: Yes Suicidal Plan?: Yes-Currently Present Specify Current Suicidal Plan: [name] reports that he will find a way to harm himself  Access to Means: Yes Specify Access to Suicidal Means: anything in the group home What has been your use of drugs/alcohol within the last 12 months?: None Reported How many times?: 0 Other Self Harm Risks: n Triggers for Past Attempts: None known Intentional Self Injurious Behavior: None Risk to Others: Homicidal Ideation: No Thoughts of Harm to Others: No Current Homicidal Intent: No Current Homicidal Plan: No Access to Homicidal Means: No Identified Victim: NA History of harm to others?: No Assessment of Violence: None Noted Violent Behavior Description: NA Does patient have access to weapons?: No Criminal Charges Pending?: No Does patient have a court date: No Prior Inpatient Therapy: Prior Inpatient Therapy: No Prior Outpatient Therapy: Prior Outpatient Therapy: No Does patient have an ACCT team?: No Does patient have Intensive In-House Services?  : No Does patient have [facility] services? : No Does patient have P4CC services?: No  Past Medical History:  Past Medical History:  Diagnosis Date  . Alcohol abuse   . C. difficile diarrhea 11/2017  . Chronic kidney disease (CKD), stage III (moderate) (HCC)   . Depression   . GERD (gastroesophageal reflux disease)   . GI bleed    a. bleeding duodenal ulcers in 04/2018 s/p cauterization and duodenalplasty  . History of melanoma   . Hypertension   . Osteoarthritis   .  PAF (paroxysmal atrial fibrillation) ([place])    a. initially noted on 05/06/2018  . Tobacco abuse     Past Surgical History:  Procedure Laterality Date  . ANKLE FRACTURE SURGERY    . CATARACT EXTRACTION W/PHACO Right 08/13/2018   Procedure: CATARACT  EXTRACTION PHACO AND INTRAOCULAR LENS PLACEMENT ([place]) RIGHT [place];  Surgeon: Marchia Meiers, MD;  Location: [facility] ORS;  Service: Ophthalmology;  Laterality: Right;  [place]  01:16 CDE 13.15 Fluid pack lot # [id number]  . CENTRAL VENOUS CATHETER INSERTION  05/08/2018   Procedure: INSERTION CENTRAL LINE ADULT;  Surgeon: Olean Ree, MD;  Location: [facility];  Service: General;;  . ESOPHAGOGASTRODUODENOSCOPY  05/08/2018   Procedure: ESOPHAGOGASTRODUODENOSCOPY (EGD);  Surgeon: Olean Ree, MD;  Location: [facility];  Service: General;;  . ESOPHAGOGASTRODUODENOSCOPY (EGD) WITH PROPOFOL N/A 05/03/2018   Procedure: ESOPHAGOGASTRODUODENOSCOPY (EGD) WITH PROPOFOL;  Surgeon: Toledo, Benay Pike, MD;  Location: [facility];  Service: Gastroenterology;  Laterality: N/A;  . EXTERNAL EAR SURGERY    . INTRAMEDULLARY (IM) NAIL INTERTROCHANTERIC Left 12/01/2017   Procedure: INTRAMEDULLARY (IM) NAIL INTERTROCHANTRIC;  Surgeon: Thornton Park, MD;  Location: [facility];  Service: Orthopedics;  Laterality: Left;  . JEJUNOSTOMY  05/08/2018   Procedure: JEJUNOSTOMY;  Surgeon: Olean Ree, MD;  Location: [facility];  Service: General;;  . LAPAROTOMY N/A 05/08/2018   Procedure: EXPLORATORY LAPAROTOMY;  Surgeon: Olean Ree, MD;  Location: [facility];  Service: General;  Laterality: N/A;  . ROTATOR CUFF REPAIR    . TONSILLECTOMY    . TYMPANOPLASTY W/ MASTOIDECTOMY     Patient with reports of mastoid surgery (appears to have had surgery on TM; Left).  . VISCERAL ARTERY INTERVENTION N/A 05/04/2018   Procedure: VISCERAL ARTERY INTERVENTION;  Surgeon: Algernon Huxley, MD;  Location: [facility];  Service: Cardiovascular;  Laterality: N/A;   Family History:  Family History  Problem Relation Age of Onset  . Prostate cancer Neg Hx   . Bladder Cancer Neg Hx   . Kidney cancer Neg Hx    Family Psychiatric  History: Patient denies Social History:  Social History   Substance and Sexual Activity  Alcohol Use No      Social History   Substance and Sexual Activity  Drug Use No    Social History   Socioeconomic History  . Marital status: Divorced    Spouse name: Not on file  . Number of children: Not on file  . Years of education: Not on file  . Highest education level: Not on file  Occupational History  . Not on file  Social Needs  . Financial resource strain: Not hard at all  . Food insecurity    Worry: Never true    Inability: Never true  . Transportation needs    Medical: No    Non-medical: No  Tobacco Use  . Smoking status: Current Every Day Smoker    Packs/day: 1.00    Years: 70.00    Pack years: 70.00    Types: Cigarettes  . Smokeless tobacco: Never Used  Substance and Sexual Activity  . Alcohol use: No  . Drug use: No  . Sexual activity: Never  Lifestyle  . Physical activity    Days per week: 1 day    Minutes per session: 90 min  . Stress: Not at all  Relationships  . Social Herbalist on phone: Not on file    Gets together: Not on file    Attends religious service: Not on  file    Active member of club or organization: Not on file    Attends meetings of clubs or organizations: Not on file    Relationship status: Not on file  Other Topics Concern  . Not on file  Social History Narrative  . Not on file   Additional Social History:    Allergies:   Allergies  Allergen Reactions  . Aspirin Other (See Comments)    bleeding    Labs:  Results for orders placed or performed during the hospital encounter of 09/05/18 (from the past 48 hour(s))  Comprehensive metabolic panel     Status: Abnormal   Collection Time: 09/09/18  9:58 PM  Result Value Ref Range   Sodium 132 (L) 135 - 145 mmol/L   Potassium 4.5 3.5 - 5.1 mmol/L   Chloride 105 98 - 111 mmol/L   CO2 21 (L) 22 - 32 mmol/L   Glucose, Bld 208 (H) 70 - 99 mg/dL   BUN 43 (H) 8 - 23 mg/dL   Creatinine, Ser 2.89 (H) 0.61 - 1.24 mg/dL   Calcium 8.2 (L) 8.9 - 10.3 mg/dL   Total Protein 6.0 (L) 6.5 -  8.1 g/dL   Albumin 3.0 (L) 3.5 - 5.0 g/dL   AST 10 (L) 15 - 41 U/L   ALT 11 0 - 44 U/L   Alkaline Phosphatase 54 38 - 126 U/L   Total Bilirubin 0.2 (L) 0.3 - 1.2 mg/dL   GFR calc non Af Amer 19 (L) >60 mL/min   GFR calc Af Amer 22 (L) >60 mL/min   Anion gap 6 5 - 15    Comment: Performed at [facility], [address] [place], [place] [address]  CBC with Differential     Status: Abnormal   Collection Time: 09/09/18  9:58 PM  Result Value Ref Range   WBC 5.4 4.0 - 10.5 K/uL   RBC 3.07 (L) 4.22 - 5.81 MIL/uL   Hemoglobin 9.4 (L) 13.0 - 17.0 g/dL   HCT 30.5 (L) 39.0 - 52.0 %   MCV 99.3 80.0 - 100.0 fL   MCH 30.6 26.0 - 34.0 pg   MCHC 30.8 30.0 - 36.0 g/dL   RDW 15.0 11.5 - 15.5 %   Platelets 202 150 - 400 K/uL   nRBC 0.0 0.0 - 0.2 %   Neutrophils Relative % 87 %   Neutro Abs 4.6 1.7 - 7.7 K/uL   Lymphocytes Relative 9 %   Lymphs Abs 0.5 (L) 0.7 - 4.0 K/uL   Monocytes Relative 4 %   Monocytes Absolute 0.2 0.1 - 1.0 K/uL   Eosinophils Relative 0 %   Eosinophils Absolute 0.0 0.0 - 0.5 K/uL   Basophils Relative 0 %   Basophils Absolute 0.0 0.0 - 0.1 K/uL   Immature Granulocytes 0 %   Abs Immature Granulocytes 0.02 0.00 - 0.07 K/uL    Comment: Performed at [facility], [address] [place],  [address]  SARS Coronavirus 2 [facility] order, Performed in [facility] hospital lab) Nasopharyngeal Nasopharyngeal Swab     Status: None   Collection Time: 09/10/18  5:58 AM   Specimen: Nasopharyngeal Swab  Result Value Ref Range   SARS Coronavirus 2 NEGATIVE NEGATIVE    Comment: (NOTE) If result is NEGATIVE SARS-CoV-2 target nucleic acids are NOT DETECTED. The SARS-CoV-2 RNA is generally detectable in upper and lower  respiratory specimens during the acute phase of infection. The lowest  concentration of SARS-CoV-2 viral copies this assay can detect  is 250  copies / mL. A negative result does not preclude SARS-CoV-2 infection  and should not be used as the sole basis for  treatment or other  patient management decisions.  A negative result may occur with  improper specimen collection / handling, submission of specimen other  than nasopharyngeal swab, presence of viral mutation(s) within the  areas targeted by this assay, and inadequate number of viral copies  (<250 copies / mL). A negative result must be combined with clinical  observations, patient history, and epidemiological information. If result is POSITIVE SARS-CoV-2 target nucleic acids are DETECTED. The SARS-CoV-2 RNA is generally detectable in upper and lower  respiratory specimens dur ing the acute phase of infection.  Positive  results are indicative of active infection with SARS-CoV-2.  Clinical  correlation with patient history and other diagnostic information is  necessary to determine patient infection status.  Positive results do  not rule out bacterial infection or co-infection with other viruses. If result is PRESUMPTIVE POSTIVE SARS-CoV-2 nucleic acids MAY BE PRESENT.   A presumptive positive result was obtained on the submitted specimen  and confirmed on repeat testing.  While 2019 novel coronavirus  (SARS-CoV-2) nucleic acids may be present in the submitted sample  additional confirmatory testing may be necessary for epidemiological  and / or clinical management purposes  to differentiate between  SARS-CoV-2 and other Sarbecovirus currently known to infect humans.  If clinically indicated additional testing with an alternate test  methodology [phone]) is advised. The SARS-CoV-2 RNA is generally  detectable in upper and lower respiratory sp ecimens during the acute  phase of infection. The expected result is Negative. Fact Sheet for Patients:  StrictlyIdeas.no Fact Sheet for Healthcare Providers: BankingDealers.co.za This test is not yet approved or cleared by the [place] FDA and has been authorized for detection and/or  diagnosis of SARS-CoV-2 by FDA under an Emergency Use Authorization (EUA).  This EUA will remain in effect (meaning this test can be used) for the duration of the COVID-19 declaration under Section 564(b)(1) of the Act, 21 U.S.C. section 360bbb-3(b)(1), unless the authorization is terminated or revoked sooner. Performed at [facility], [address], [place], [place] [address]   Urinalysis, Routine w reflex microscopic     Status: None   Collection Time: 09/10/18  7:28 AM  Result Value Ref Range   Color, Urine YELLOW YELLOW   APPearance CLEAR CLEAR   Specific Gravity, Urine 1.015 1.005 - 1.030   pH 6.0 5.0 - 8.0   Glucose, UA NEGATIVE NEGATIVE mg/dL   Hgb urine dipstick NEGATIVE NEGATIVE   Bilirubin Urine NEGATIVE NEGATIVE   Ketones, ur NEGATIVE NEGATIVE mg/dL   Protein, ur NEGATIVE NEGATIVE mg/dL   Nitrite NEGATIVE NEGATIVE   Leukocytes,Ua NEGATIVE NEGATIVE    Comment: Performed at [facility], [address] [place], [place] [address]    Medications:  Current Facility-Administered Medications  Medication Dose Route Frequency Provider Last Rate Last Dose  . acetaminophen (TYLENOL) tablet 650 mg  650 mg Oral Q4H PRN Lily Kocher, PA-C   650 mg at 09/09/18 0345  . amiodarone (PACERONE) tablet 200 mg  200 mg Oral Daily Lily Kocher, PA-C   200 mg at 09/11/18 0910  . calcium-vitamin D (OSCAL WITH D) 500-200 MG-UNIT per tablet 1 tablet  1 tablet Oral BID Lily Kocher, PA-C   1 tablet at 09/11/18 0910  . doxycycline (VIBRA-TABS) tablet 100 mg  100 mg Oral Q12H Petrucelli, Samantha R, PA-C   100 mg at  09/11/18 0910  . ferrous sulfate 300 (60 Fe) MG/5ML syrup 220 mg  220 mg Oral QPC breakfast Lily Kocher, PA-C   220 mg at 09/11/18 0911  . folic acid (FOLVITE) tablet 1 mg  1 mg Oral Daily Lily Kocher, PA-C   1 mg at 09/11/18 0910  . hydrOXYzine (ATARAX/VISTARIL) tablet 10 mg  10 mg Oral Q8H PRN Francine Graven, DO   10 mg at 09/11/18 1407  . loperamide (IMODIUM) capsule 2  mg  2 mg Oral QID PRN Petrucelli, Samantha R, PA-C      . metoprolol tartrate (LOPRESSOR) tablet 12.5 mg  12.5 mg Oral Q breakfast Lily Kocher, PA-C   12.5 mg at 09/11/18 0910  . metroNIDAZOLE (FLAGYL) tablet 500 mg  500 mg Oral Q12H Petrucelli, Samantha R, PA-C   500 mg at 09/11/18 0910  . pantoprazole (PROTONIX) EC tablet 40 mg  40 mg Oral Daily Lily Kocher, PA-C   40 mg at 09/11/18 0910  . traMADol (ULTRAM) tablet 50 mg  50 mg Oral BID PRN Noemi Chapel, MD   50 mg at 09/11/18 0910  . traZODone (DESYREL) tablet 100 mg  100 mg Oral QHS Lily Kocher, PA-C   100 mg at 09/10/18 2101   Current Outpatient Medications  Medication Sig Dispense Refill  . amiodarone (PACERONE) 400 MG tablet Take 0.5 tablets (200 mg total) by mouth 2 (two) times daily. 30 tablet 0  . calcium-vitamin D (OSCAL WITH D) 500-200 MG-UNIT tablet Take 1 tablet by mouth 2 (two) times daily. 60 tablet 1  . DUREZOL 0.05 % EMUL Place 1 drop into the right eye daily.    . ferrous sulfate (FEROSUL) 325 (65 FE) MG tablet Take 1 tablet (325 mg total) by mouth daily. 30 tablet 0  . folic acid (FOLVITE) 1 MG tablet TAKE 1 TABLET BY MOUTH DAILY (Patient taking differently: Take 1 mg by mouth daily. ) 30 tablet 0  . metoprolol tartrate (LOPRESSOR) 25 MG tablet Take 0.5 tablets (12.5 mg total) by mouth daily. 15 tablet 0  . mirtazapine (REMERON) 30 MG tablet Take 0.5 tablets (15 mg total) by mouth at bedtime. 30 tablet 0  . pantoprazole (PROTONIX) 40 MG tablet TAKE 1 TABLET DAILY AT 7:00 AM ON AN EMPTY STOMACH (Patient taking differently: Take 40 mg by mouth daily. ) 30 tablet 0  . polyethylene glycol (MIRALAX / GLYCOLAX) 17 g packet Take 17 g by mouth daily. 14 each 0  . tamsulosin (FLOMAX) 0.4 MG CAPS capsule TAKE 1 CAPSULE BY MOUTH DAILY (Patient taking differently: Take 0.4 mg by mouth daily. ) 30 capsule 0  . thiamine 100 MG tablet Take 1 tablet (100 mg total) by mouth daily. 30 tablet 1  . traZODone (DESYREL) 100 MG tablet Take  1 tablet (100 mg total) by mouth at bedtime. 30 tablet 3  . doxycycline (VIBRAMYCIN) 100 MG capsule Take 1 capsule (100 mg total) by mouth 2 (two) times daily. 14 capsule 0  . loperamide (IMODIUM) 2 MG capsule Take 1 capsule (2 mg total) by mouth 4 (four) times daily as needed for diarrhea or loose stools. 10 capsule 0  . metroNIDAZOLE (FLAGYL) 500 MG tablet Take 1 tablet (500 mg total) by mouth 2 (two) times daily. 14 tablet 0  . predniSONE (DELTASONE) 20 MG tablet Take 2 tablets (40 mg total) by mouth daily. 10 tablet 0    Musculoskeletal: Strength & Muscle Tone: within normal limits Gait & Station: normal Patient leans: N/A  Psychiatric Specialty  Exam: Physical Exam  ROS  Blood pressure 119/60, pulse (!) 58, temperature 98.4 F (36.9 C), temperature source Oral, resp. rate 18, height 5\' 7"  (1.702 m), weight 54.4 kg, SpO2 99 %.Body mass index is 18.79 kg/m.  General Appearance: Casual  Eye Contact:  Good  Speech:  Clear and Coherent  Volume:  Normal  Mood:  Hopeful  Affect:  Appropriate  Thought Process:  Coherent, Goal Directed and Descriptions of Associations: Intact  Orientation:  Full (Time, Place, and Person)  Thought Content:  WDL and Logical  Suicidal Thoughts:  No  Homicidal Thoughts:  No  Memory:  Immediate;   Good Recent;   Good Remote;   Good  Judgement:  Good  Insight:  Fair  Psychomotor Activity:  Normal  Concentration:  Concentration: Fair and Attention Span: Fair  Recall:  Good  Fund of Knowledge:  Fair  Language:  Fair  Akathisia:  No  Handed:  Right  AIMS (if indicated):     Assets:  Communication Skills Desire for Improvement Housing  ADL's:  Intact  Cognition:  WNL  Sleep:        Treatment Plan Summary: Plan to psychiatrically clear patient.  Disposition: No evidence of imminent risk to self or others at present.   Patient does not meet criteria for psychiatric inpatient admission. Supportive therapy provided about ongoing  stressors. Discussed crisis plan, support from social network, calling 911, coming to the Emergency Department, and calling Suicide Hotline.   Patient would like to return to facility, would like to contact Case manager, [facility], regarding alternate placement.   This service was provided via telemedicine using a 2-way, interactive audio and video technology.  Names of all persons participating in this telemedicine service and their role in this encounter. Name: Cain Sieve Patient  [facility]    Emmaline Kluver, [place] 09/11/2018 3:30 PM

## 2018-09-11 NOTE — ED Notes (Signed)
Pt reports he is unable to sleep- requesting "pill he was given the other day for anxiety"-  Dr Kathrynn Humble made aware pt requesting Ativan.

## 2018-09-11 NOTE — ED Notes (Signed)
Upon reviewing previous notes in the patient chart regarding placement, a couple of notes were found from September 1st regarding recommendations from Dr Caryl Bis (Pt's primary care) for pt to go to [facility].   This nurse called [facility] to inquire if this information was known to them.  They informed me they would look into this to determine if there would be a change in placement.

## 2018-09-11 NOTE — ED Notes (Signed)
Per [facility], patient will hopefully be cleared for pysch and made a hospice patient due to diagnosis of Melanoma, will get back to [facility].

## 2018-09-11 NOTE — ED Notes (Addendum)
Patient states his anxiety is through the roof, informed EDP Thurnell Garbe, who gave prn order for Atarax, will continue to monitor.

## 2018-09-11 NOTE — Progress Notes (Signed)
CSW left HIPAA compliant voicemail requesting a return phone call with APS supervisor Kizzie Bane in an attempt to discuss pt's referral for Hospice care.   CSW will continue to follow.   Audree Camel, LCSW, [facility]/TTS [phone]

## 2018-09-12 MED ORDER — PREDNISOLONE ACETATE 1 % OP SUSP
1.0000 [drp] | Freq: Four times a day (QID) | OPHTHALMIC | Status: DC
  Administered 2018-09-12 – 2018-09-13 (×5): 1 [drp] via OPHTHALMIC
  Filled 2018-09-12: qty 1

## 2018-09-12 MED ORDER — DIFLUPREDNATE 0.05 % OP EMUL: 1.0000 [drp] | Freq: Every day | OPHTHALMIC | Status: DC

## 2018-09-12 MED ORDER — LORAZEPAM 1 MG PO TABS
1.0000 mg | ORAL_TABLET | Freq: Once | ORAL | Status: AC
  Administered 2018-09-12: 1 mg via ORAL
  Filled 2018-09-12: qty 1

## 2018-09-12 NOTE — ED Notes (Signed)
Pt states his LT leg has been weaker for the past 7-8 days, pt has been walking around the room all day with a walker.  Pt is concerned he may have had a stroke. edp notified and went in to see pt

## 2018-09-12 NOTE — ED Notes (Signed)
Snack given to Pt.

## 2018-09-12 NOTE — ED Notes (Signed)
Given meal tray.

## 2018-09-13 NOTE — Clinical Social Work Note (Signed)
Justin Andrews, DSS Supervisor, advised that patient could indeed be referred to [name] for hospice services at his current [facility].  April at [facility] was provided verbal demographics and referral faxed.   Patient has been psych cleared.  Calls have been placed to [name] at [phone], however with each call a recording states call cannot be completed, hang up and try again later.   LCSW will keep attempting contact with [name] to inform that patient is psych cleared and can return to the facility.      Alyzah Pelly, Clydene Pugh, LCSW

## 2018-09-13 NOTE — ED Notes (Signed)
Spoke with Merrily Pew, Tourist information centre manager at [facility], regarding possible discharge planning for this pt as based on notes he is psych cleared and a candidate for possible hospice. States he is familiar with Mr. Huston and will begin working on this.

## 2018-09-13 NOTE — ED Notes (Signed)
Spoke with Elnoria Howard at [facility] who can be reached at [phone]. Mr. Dimarco may return to their care,

## 2018-09-13 NOTE — Clinical Social Work Note (Signed)
Laqueta Carina, legal guardian, notified of patient's discharge and return to [facility] .  Claiborne Billings with [name] advised of discharge. She inquired about equipment needed for patient. Advised to contact facility and provided updated number [phone] Mechele Claude Span). Provided contact information for Ms. Laurence Ferrari.     Lakindra Wible, Clydene Pugh, LCSW

## 2018-09-15 ENCOUNTER — Telehealth: Payer: Self-pay | Admitting: Family Medicine

## 2018-09-15 NOTE — Telephone Encounter (Addendum)
This call came in after hours on Friday, from notes it looks like patient was admitted to ER only and was cleared from Psych evaluation and returned to nursing facility to go under Hospice care. After speaking with PCP called patient son and he had spoken to the Social worker and he has been updated on his fathers condition. There is a hearing this morning and DSS will be taking guardianship of patient, per son he cannot continue to meet the demands of his father calling him at work 20 to 30 times per day he is afraid he will lose his job. He just wanted to et PCP know he wanted to look after his father but he just not able to financially.

## 2018-09-15 NOTE — Telephone Encounter (Signed)
Noted. Thank you for touching base with the son.

## 2018-09-15 NOTE — Telephone Encounter (Signed)
Pt was discharge 9.6.20 from [facility] want to know if you would agree to a hospice order and would you be the attending physician for hospice. Please advise. They can begin with a verbal order if possible and please call their Triage Nurse at [phone] for verbal

## 2018-09-15 NOTE — Telephone Encounter (Signed)
I am fine remaining the attending though I would like for the hospice physician to take care of any symptom related orders.

## 2018-09-15 NOTE — Telephone Encounter (Signed)
Verbal order given to crystal as directed.

## 2018-09-15 NOTE — ED Notes (Signed)
Discharge prescriptions verified with Dr. Roderic Palau and called in to [facility] [phone] per request of Edward Qualia at [facility] [phone].

## 2018-09-17 ENCOUNTER — Telehealth: Payer: Self-pay | Admitting: Family Medicine

## 2018-09-17 NOTE — Telephone Encounter (Unsigned)
Copied from [place] [id number]. Topic: Quick Communication - Home Health Verbal Orders >> Sep 17, 2018  2:26 PM Carolyn Stare wrote: Justin Andrews with [facility] care  Number [phone]  Requesting verbal order to remove stiches received at ER

## 2018-09-18 NOTE — Telephone Encounter (Signed)
Called and gave verbal orders  To remove stitches on voicemail of  Justin Andrews.  Nina,cma

## 2018-09-18 NOTE — Telephone Encounter (Signed)
You can give verbal orders to remove the stitches. Thanks.

## 2018-09-24 ENCOUNTER — Telehealth: Payer: Self-pay

## 2018-09-24 DIAGNOSIS — I5022 Chronic systolic (congestive) heart failure: Secondary | ICD-10-CM

## 2018-09-24 DIAGNOSIS — N184 Chronic kidney disease, stage 4 (severe): Secondary | ICD-10-CM

## 2018-09-24 NOTE — Telephone Encounter (Signed)
Copied from [place] [id number]. Topic: General - Other >> Sep 24, 2018 12:24 PM Keene Breath wrote: Reason for CRM: Patient's son called to speak with the nurse or doctor about his father's care.  He was told several times that the doctor would call him to discuss his father's care but has not heard from anyone.  Please call patient at [phone] to discuss.

## 2018-09-25 NOTE — Telephone Encounter (Signed)
Patient's son called to speak with the nurse or doctor about his father's care.  He was told several times that the doctor would call him to discuss his father's care but has not heard from anyone.  Please call patient at [phone] to discuss.

## 2018-09-28 NOTE — Telephone Encounter (Signed)
Please contact the patient's son on Tuesday to see what questions or concerns he has.  Thanks.

## 2018-09-29 ENCOUNTER — Telehealth: Payer: Self-pay

## 2018-09-29 NOTE — Telephone Encounter (Signed)
Pt was given and taking Doxy, he may have had an allergic reaction, the nurses gave patient benadryl for sx. Patients hands have become very swollen, red, and painful for the patient. The whole top of patients hand not the joints.  Patient has been of the doxy 3-4 days. Sx are getting worse. They were wondering if it is actually an allergic reaction. Nurse Judson Roch can be reached @ [phone] for futher questions/call back. Please advise.

## 2018-09-29 NOTE — Telephone Encounter (Signed)
I called the nurse Judson Roch and she states both hands are wamr to touch, swollen and red. Tender to touch it looks like gout but she did not know if he has been treated for gout before.  This all started last wednesday and the patient was on doxycycline and he finished them on thursday.  They gave him benadryl but it is not helping.  Please advise.  Nina,cma

## 2018-09-29 NOTE — Telephone Encounter (Addendum)
I spoke with the patient's son.  He noted he had been told many different things by several doctors at the hospital.  He noted that he had been told that the patient's kidney function was past being able to use dialysis and that his heart function was not good enough to tolerate a surgery on his kidneys.  He was also advised by the hospital that I had wanted the patient on hospice for cancer and that he should be in a hospice house.  I discussed with the patient's son that his kidney function was not at a point where that he would require dialysis though if they did get to the point of needing dialysis they should be able to do this.  His heart function is decreased and neck could potentially limit their ability to put in a permanent access for dialysis and limit other surgical interventions.  He also noted that the hospital told him that the patient had 2 to 3 months to live.  I discussed with the patient's son that it is in an exact prediction when they say how long somebody has to live.  It is possible he could live a shorter period of time for a longer period of time.  Certainly with his medical issues and lack of self-care I would expect that he has less than 6 months to live though it is certainly possible that he could live longer.  I discussed with the hospice care that they should be able to provide additional services to the patient such as pain control and to help make him comfortable.  I reviewed his most recent renal function and appears to have worsened.  Discussed having him see a nephrologist.  Discussed having him see a cardiologist given his cardiac function as well.  Referrals will be placed.  Patient's son notes the contact number for the patient's facility is [phone].

## 2018-09-29 NOTE — Telephone Encounter (Signed)
I called and spoke with the patient's son and he insisted on speaking with the provider and I transferred the call to the provider per the provider.  Nina,cma

## 2018-09-29 NOTE — Telephone Encounter (Signed)
I called and spoke with nurse Judson Roch and she stated that she will discuss that with hospice and let them know.  Nina,cma

## 2018-09-29 NOTE — Telephone Encounter (Signed)
Noted. This could represent an allergic reaction, sun burn if he has been out in the sun with taking the doxycyline, or could be infection related. I would advise that he be seen today at an urgent care for further evaluation.

## 2018-10-03 ENCOUNTER — Telehealth: Payer: Self-pay | Admitting: Family Medicine

## 2018-10-03 NOTE — Addendum Note (Signed)
Addended by: Leone Haven on: 10/03/2018 03:36 PM   Modules accepted: Orders

## 2018-10-03 NOTE — Telephone Encounter (Signed)
I received orders from the patient's hospice service.  Please contact [facility] at [phone] and please ask about the standard comfort kit.  Please see if this is shipped directly to the patient for him to manage himself or if there is somebody else that will be managing these medications.  Thanks.

## 2018-10-06 ENCOUNTER — Telehealth: Payer: Self-pay

## 2018-10-06 NOTE — Telephone Encounter (Signed)
Copied from [place] [id number]. Topic: General - Call Back - No Documentation >> Oct 06, 2018 12:34 PM Erick Blinks wrote: Case manager Sarah requesting call back with information regarding pt's recent psychiatrist. Please advise Best contact: [phone]

## 2018-10-07 NOTE — Telephone Encounter (Signed)
I called and spoke with the triage nurse about the comfort kit and she stated that once you fill out the form she faxes it to the pharmacy and they will send it to the patient's home,  the kit is for emergencies of SOB until the nurse arrives.  She also stated to be sure and put your dea number on the form or they will not fill it.  Aviah Sorci,cma

## 2018-10-08 ENCOUNTER — Telehealth: Payer: Self-pay

## 2018-10-08 NOTE — Telephone Encounter (Signed)
Copied from [place] [id number]. Topic: General - Other >> Oct 08, 2018  3:12 PM Carolyn Stare wrote: Pamala Hurry with [facility] lto say she faxed over paperwork and has not received the paperwork back  and Is asking if the paperwork was received   [phone]

## 2018-10-08 NOTE — Telephone Encounter (Signed)
Noted. Form signed. Please fax.

## 2018-10-09 NOTE — Telephone Encounter (Signed)
Paperwork was faxed to [facility].  On 10/08/2018 Justin Andrews,cma

## 2018-10-09 NOTE — Telephone Encounter (Signed)
I called and left a voicemail for Judson Roch to call back the patient went to a walk-in clinic called [facility] to see a psychiiatrist the address is [place] [phone].  Dorinda Stehr,cma

## 2018-10-09 NOTE — Telephone Encounter (Signed)
Form was faxed. With fax confirmation.  Mahsa Hanser,cma

## 2018-10-14 NOTE — Telephone Encounter (Signed)
I have spoke with Justin Andrews & she did receive Nina's VM last week. She has all info she needed for now.

## 2018-10-23 ENCOUNTER — Ambulatory Visit: Payer: Medicare HMO | Admitting: Family Medicine

## 2018-10-23 DIAGNOSIS — Z0289 Encounter for other administrative examinations: Secondary | ICD-10-CM

## 2018-11-09 ENCOUNTER — Telehealth: Payer: Self-pay

## 2018-11-09 NOTE — Telephone Encounter (Signed)
I faxed over the forms signed by the provider for the patient today.  Naftali Carchi,cma

## 2018-11-13 DIAGNOSIS — H2512 Age-related nuclear cataract, left eye: Secondary | ICD-10-CM | POA: Diagnosis not present

## 2018-11-16 ENCOUNTER — Encounter: Payer: Self-pay | Admitting: *Deleted

## 2018-11-23 ENCOUNTER — Other Ambulatory Visit
Admission: RE | Admit: 2018-11-23 | Discharge: 2018-11-23 | Disposition: A | Discharge status: Home / Self Care | Payer: Medicare HMO | Source: Ambulatory Visit | Attending: Ophthalmology | Admitting: Ophthalmology

## 2018-11-23 NOTE — Pre-Procedure Instructions (Signed)
Attempted to reach patient via phone.  No voicemail set up so I was unable to leave a message notifying him to come to the [facility] for Covid testing.

## 2018-11-25 ENCOUNTER — Other Ambulatory Visit: Payer: Self-pay

## 2018-11-25 ENCOUNTER — Other Ambulatory Visit
Admission: RE | Admit: 2018-11-25 | Discharge: 2018-11-25 | Disposition: A | Discharge status: Home / Self Care | Payer: Medicare Other | Source: Ambulatory Visit | Attending: Ophthalmology | Admitting: Ophthalmology

## 2018-11-25 DIAGNOSIS — Z01812 Encounter for preprocedural laboratory examination: Secondary | ICD-10-CM | POA: Diagnosis present

## 2018-11-25 DIAGNOSIS — Z20828 Contact with and (suspected) exposure to other viral communicable diseases: Secondary | ICD-10-CM | POA: Insufficient documentation

## 2018-11-25 LAB — SARS CORONAVIRUS 2 (TAT 6-24 HRS): SARS Coronavirus 2: NEGATIVE

## 2018-11-26 ENCOUNTER — Encounter: Payer: Self-pay | Admitting: Ophthalmology

## 2018-11-26 ENCOUNTER — Ambulatory Visit
Admission: RE | Admit: 2018-11-26 | Discharge: 2018-11-26 | Disposition: A | Discharge status: Home / Self Care | Payer: Medicare Other | Attending: Ophthalmology | Admitting: Ophthalmology

## 2018-11-26 ENCOUNTER — Ambulatory Visit: Payer: Medicare Other | Admitting: Anesthesiology

## 2018-11-26 ENCOUNTER — Encounter
Admission: RE | Disposition: A | Discharge status: Home / Self Care | Payer: Self-pay | Source: Home / Self Care | Attending: Ophthalmology

## 2018-11-26 DIAGNOSIS — H2512 Age-related nuclear cataract, left eye: Secondary | ICD-10-CM | POA: Insufficient documentation

## 2018-11-26 DIAGNOSIS — F419 Anxiety disorder, unspecified: Secondary | ICD-10-CM | POA: Insufficient documentation

## 2018-11-26 DIAGNOSIS — F17200 Nicotine dependence, unspecified, uncomplicated: Secondary | ICD-10-CM | POA: Diagnosis not present

## 2018-11-26 DIAGNOSIS — Z886 Allergy status to analgesic agent status: Secondary | ICD-10-CM | POA: Diagnosis not present

## 2018-11-26 DIAGNOSIS — J449 Chronic obstructive pulmonary disease, unspecified: Secondary | ICD-10-CM | POA: Diagnosis not present

## 2018-11-26 DIAGNOSIS — I129 Hypertensive chronic kidney disease with stage 1 through stage 4 chronic kidney disease, or unspecified chronic kidney disease: Secondary | ICD-10-CM | POA: Insufficient documentation

## 2018-11-26 DIAGNOSIS — N183 Chronic kidney disease, stage 3 unspecified: Secondary | ICD-10-CM | POA: Insufficient documentation

## 2018-11-26 DIAGNOSIS — J439 Emphysema, unspecified: Secondary | ICD-10-CM | POA: Insufficient documentation

## 2018-11-26 DIAGNOSIS — M1990 Unspecified osteoarthritis, unspecified site: Secondary | ICD-10-CM | POA: Diagnosis not present

## 2018-11-26 DIAGNOSIS — D631 Anemia in chronic kidney disease: Secondary | ICD-10-CM | POA: Diagnosis not present

## 2018-11-26 DIAGNOSIS — R69 Illness, unspecified: Secondary | ICD-10-CM | POA: Diagnosis not present

## 2018-11-26 DIAGNOSIS — K219 Gastro-esophageal reflux disease without esophagitis: Secondary | ICD-10-CM | POA: Insufficient documentation

## 2018-11-26 DIAGNOSIS — I4891 Unspecified atrial fibrillation: Secondary | ICD-10-CM | POA: Insufficient documentation

## 2018-11-26 DIAGNOSIS — F329 Major depressive disorder, single episode, unspecified: Secondary | ICD-10-CM | POA: Diagnosis not present

## 2018-11-26 DIAGNOSIS — Z79899 Other long term (current) drug therapy: Secondary | ICD-10-CM | POA: Insufficient documentation

## 2018-11-26 HISTORY — DX: Cardiac arrhythmia, unspecified: I49.9

## 2018-11-26 HISTORY — DX: Other allergic rhinitis: J30.89

## 2018-11-26 HISTORY — DX: Dyspnea, unspecified: R06.00

## 2018-11-26 HISTORY — DX: Chronic kidney disease, unspecified: N18.9

## 2018-11-26 HISTORY — DX: Wheezing: R06.2

## 2018-11-26 HISTORY — DX: Respiratory failure, unspecified, unspecified whether with hypoxia or hypercapnia: J96.90

## 2018-11-26 HISTORY — DX: Disease of stomach and duodenum, unspecified: K31.9

## 2018-11-26 HISTORY — DX: Unspecified protein-calorie malnutrition: E46

## 2018-11-26 HISTORY — DX: Pneumonia, unspecified organism: J18.9

## 2018-11-26 HISTORY — DX: Other abnormalities of gait and mobility: R26.89

## 2018-11-26 HISTORY — DX: Anemia, unspecified: D64.9

## 2018-11-26 HISTORY — DX: Proteinuria, unspecified: R80.9

## 2018-11-26 HISTORY — PX: CATARACT EXTRACTION W/PHACO: SHX586

## 2018-11-26 HISTORY — DX: Chronic pulmonary edema: J81.1

## 2018-11-26 HISTORY — DX: Cyst of kidney, acquired: N28.1

## 2018-11-26 HISTORY — DX: Nodular prostate without lower urinary tract symptoms: N40.2

## 2018-11-26 HISTORY — DX: Chronic cough: R05.3

## 2018-11-26 HISTORY — DX: Anxiety disorder, unspecified: F41.9

## 2018-11-26 SURGERY — PHACOEMULSIFICATION, CATARACT, WITH IOL INSERTION
Anesthesia: Monitor Anesthesia Care | Site: Eye | Laterality: Left

## 2018-11-26 MED ORDER — DEXMEDETOMIDINE HCL 200 MCG/2ML IV SOLN
INTRAVENOUS | Status: DC | PRN
  Administered 2018-11-26 (×2): 4 ug via INTRAVENOUS
  Administered 2018-11-26: 8 ug via INTRAVENOUS

## 2018-11-26 MED ORDER — NEOMYCIN-POLYMYXIN-DEXAMETH 3.5-10000-0.1 OP OINT
TOPICAL_OINTMENT | OPHTHALMIC | Status: DC | PRN
  Administered 2018-11-26: 1 via OPHTHALMIC

## 2018-11-26 MED ORDER — ARMC OPHTHALMIC DILATING DROPS
OPHTHALMIC | Status: AC
  Administered 2018-11-26: 1 via OPHTHALMIC
  Filled 2018-11-26: qty 0.5

## 2018-11-26 MED ORDER — NA CHONDROIT SULF-NA HYALURON 40-17 MG/ML IO SOLN
INTRAOCULAR | Status: DC | PRN
  Administered 2018-11-26: 1 mL via INTRAOCULAR

## 2018-11-26 MED ORDER — ARMC OPHTHALMIC DILATING DROPS
1.0000 "application " | OPHTHALMIC | Status: AC
  Administered 2018-11-26 (×3): 1 via OPHTHALMIC

## 2018-11-26 MED ORDER — TRYPAN BLUE 0.06 % OP SOLN
OPHTHALMIC | Status: DC | PRN
  Administered 2018-11-26: 0.5 mL via INTRAOCULAR

## 2018-11-26 MED ORDER — FENTANYL CITRATE (PF) 100 MCG/2ML IJ SOLN
INTRAMUSCULAR | Status: DC | PRN
  Administered 2018-11-26 (×2): 25 ug via INTRAVENOUS

## 2018-11-26 MED ORDER — TETRACAINE HCL 0.5 % OP SOLN
1.0000 [drp] | Freq: Two times a day (BID) | OPHTHALMIC | Status: AC
  Administered 2018-11-26 (×2): 1 [drp] via OPHTHALMIC

## 2018-11-26 MED ORDER — MOXIFLOXACIN HCL 0.5 % OP SOLN
OPHTHALMIC | Status: DC | PRN
  Administered 2018-11-26: 0.2 mL via OPHTHALMIC

## 2018-11-26 MED ORDER — TETRACAINE HCL 0.5 % OP SOLN
OPHTHALMIC | Status: AC
  Administered 2018-11-26: 1 [drp] via OPHTHALMIC
  Filled 2018-11-26: qty 4

## 2018-11-26 MED ORDER — NA HYALUR & NA CHOND-NA HYALUR 0.55-0.5 ML IO KIT
PACK | INTRAOCULAR | Status: DC | PRN
  Administered 2018-11-26: 1 via OPHTHALMIC

## 2018-11-26 MED ORDER — MOXIFLOXACIN HCL 0.5 % OP SOLN: 1.0000 [drp] | Freq: Once | OPHTHALMIC | Status: DC

## 2018-11-26 MED ORDER — FENTANYL CITRATE (PF) 100 MCG/2ML IJ SOLN
INTRAMUSCULAR | Status: AC
  Filled 2018-11-26: qty 2

## 2018-11-26 MED ORDER — EPINEPHRINE PF 1 MG/ML IJ SOLN
INTRAOCULAR | Status: DC | PRN
  Administered 2018-11-26: 12:00:00 via OPHTHALMIC

## 2018-11-26 MED ORDER — POVIDONE-IODINE 5 % OP SOLN
OPHTHALMIC | Status: DC | PRN
  Administered 2018-11-26: 1 via OPHTHALMIC

## 2018-11-26 MED ORDER — MOXIFLOXACIN HCL 0.5 % OP SOLN
OPHTHALMIC | Status: AC
  Filled 2018-11-26: qty 3

## 2018-11-26 MED ORDER — LIDOCAINE HCL (PF) 4 % IJ SOLN
INTRAOCULAR | Status: DC | PRN
  Administered 2018-11-26: 4 mL via OPHTHALMIC

## 2018-11-26 MED ORDER — SODIUM CHLORIDE 0.9 % IV SOLN
INTRAVENOUS | Status: DC
  Administered 2018-11-26: 10:00:00 via INTRAVENOUS

## 2018-11-26 SURGICAL SUPPLY — 18 items
BNDG EYE OVAL (GAUZE/BANDAGES/DRESSINGS) ×2 IMPLANT
DISSECTOR HYDRO NUCLEUS 50X22 (MISCELLANEOUS) ×8 IMPLANT
DRSG TEGADERM 2-3/8X2-3/4 SM (GAUZE/BANDAGES/DRESSINGS) ×2 IMPLANT
GLOVE BIOGEL M 6.5 STRL (GLOVE) ×2 IMPLANT
GOWN STRL REUS W/ TWL LRG LVL3 (GOWN DISPOSABLE) ×1 IMPLANT
GOWN STRL REUS W/ TWL XL LVL3 (GOWN DISPOSABLE) ×1 IMPLANT
GOWN STRL REUS W/TWL LRG LVL3 (GOWN DISPOSABLE) ×2
GOWN STRL REUS W/TWL XL LVL3 (GOWN DISPOSABLE) ×2
KNIFE 45D UP 2.3 (MISCELLANEOUS) ×2 IMPLANT
LABEL CATARACT MEDS ST (LABEL) ×2 IMPLANT
LENS IOL TECNIS ITEC 20.5 (Intraocular Lens) ×1 IMPLANT
PACK CATARACT (MISCELLANEOUS) ×2 IMPLANT
PACK CATARACT KING (MISCELLANEOUS) ×2 IMPLANT
PACK EYE AFTER SURG (MISCELLANEOUS) ×2 IMPLANT
SOL BSS BAG (MISCELLANEOUS) ×2
SOLUTION BSS BAG (MISCELLANEOUS) ×1 IMPLANT
WATER STERILE IRR 250ML POUR (IV SOLUTION) ×2 IMPLANT
WIPE NON LINTING 3.25X3.25 (MISCELLANEOUS) ×2 IMPLANT

## 2018-11-26 NOTE — Discharge Instructions (Signed)
Eye Surgery Discharge Instructions    Expect mild scratchy sensation or mild soreness. DO NOT RUB YOUR EYE!  The day of surgery:  Minimal physical activity, but bed rest is not required  No reading, computer work, or close hand work  No bending, lifting, or straining.  May watch TV  For 24 hours:  No driving, legal decisions, or alcoholic beverages  Safety precautions  Eat anything you prefer: It is better to start with liquids, then soup then solid foods.  _____ Eye patch should be worn until postoperative exam tomorrow.  ____ Solar shield eyeglasses should be worn for comfort in the sunlight/patch while sleeping  Resume all regular medications including aspirin or Coumadin if these were discontinued prior to surgery. You may shower, bathe, shave, or wash your hair. Tylenol may be taken for mild discomfort.  Call your doctor if you experience significant pain, nausea, or vomiting, fever > 101 or other signs of infection. [phone] or [phone] Specific instructions:  Follow-up Information    Marchia Meiers, MD Follow up on 11/17/2018.   Specialty: Ophthalmology Why: @ 9:10 am for post op visit Contact information: [place]  [phone]

## 2018-11-26 NOTE — H&P (Addendum)
This note was entered in error.

## 2018-11-26 NOTE — Anesthesia Preprocedure Evaluation (Addendum)
Anesthesia Evaluation  Patient identified by MRN, date of birth, ID band Patient awake    Reviewed: Allergy & Precautions, H&P , NPO status , reviewed documented beta blocker date and time   Airway Mallampati: II  TM Distance: >3 FB Neck ROM: full    Dental  (+) Poor Dentition, Caps, Chipped   Pulmonary shortness of breath, pneumonia, resolved, COPD, Current Smoker and Patient abstained from smoking.,    Pulmonary exam normal        Cardiovascular hypertension, + DOE  Normal cardiovascular exam+ dysrhythmias Atrial Fibrillation   ECHO 08/2018 IMPRESSIONS    1. The left ventricle has moderate-severely reduced systolic function, with an ejection fraction of 30-35%. The cavity size was normal. There is mildly increased left ventricular wall thickness. Left ventricular diastolic function could not be evaluated  secondary to atrial fibrillation. Left ventricular diffuse hypokinesis.  2. Left atrial size was mild-moderately dilated.  3. Right atrial size was mildly dilated.  4. The mitral valve is degenerative. Mild thickening of the mitral valve leaflet. Mitral valve regurgitation is moderate to severe by color flow Doppler.  5. The tricuspid valve is grossly normal. Tricuspid valve regurgitation is mild-moderate.  6. The aortic valve was not well visualized. Mild thickening of the aortic valve. Aortic valve regurgitation is mild by color flow Doppler.  7. The aorta is normal unless otherwise noted.  8. The interatrial septum was not well visualized.   Neuro/Psych PSYCHIATRIC DISORDERS Anxiety Depression    GI/Hepatic GERD  Controlled,  Endo/Other    Renal/GU Renal disease     Musculoskeletal  (+) Arthritis ,   Abdominal   Peds  Hematology  (+) Blood dyscrasia, anemia ,   Anesthesia Other Findings Past Medical History: No date: Alcohol abuse No date: Anemia No date: Anxiety No date: Balance problem 11/2017: C.  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 bleeding duodenal ulcers in 04/2018 s/p cauterization               and duodenalplasty No date: History of melanoma No date: Hypertension No date: Malnutrition ([place]) No date: Osteoarthritis No date: PAF (paroxysmal atrial fibrillation) ([place])     Comment:  a. initially noted on 05/06/2018 No date: Pneumonia No date: Prostate nodule No date: Protein in urine No date: Pulmonary edema No date: Renal cyst No date: Respiratory failure ([place]) No date: Tobacco abuse No date: Wheezing  Past Surgical History: No date: ANKLE FRACTURE SURGERY 08/13/2018: CATARACT EXTRACTION W/PHACO; Right     Comment:  Procedure: CATARACT EXTRACTION PHACO AND INTRAOCULAR               LENS PLACEMENT ([place]) RIGHT [place];                Surgeon: Marchia Meiers, MD;  Location: [facility];                Service: Ophthalmology;  Laterality: Right;  [place]                01:16 CDE 13.15 Fluid pack lot #  [id number] 05/08/2018: CENTRAL VENOUS CATHETER INSERTION     Comment:  Procedure: INSERTION CENTRAL LINE ADULT;  Surgeon:               Olean Ree, MD;  Location: [facility];  Service:               General;; 05/08/2018: ESOPHAGOGASTRODUODENOSCOPY     Comment:  Procedure: ESOPHAGOGASTRODUODENOSCOPY (EGD);  Surgeon:               Olean Ree, MD;  Location: [facility];  Service:               General;; 05/03/2018: ESOPHAGOGASTRODUODENOSCOPY (EGD) WITH PROPOFOL; N/A     Comment:  Procedure: ESOPHAGOGASTRODUODENOSCOPY (EGD) WITH                PROPOFOL;  Surgeon: Toledo, Benay Pike, MD;  Location:               [facility];  Service: Gastroenterology;  Laterality:               N/A; No date: EXTERNAL EAR SURGERY 12/01/2017: INTRAMEDULLARY (IM) NAIL INTERTROCHANTERIC; Left     Comment:  Procedure: INTRAMEDULLARY (IM) NAIL INTERTROCHANTRIC;                Surgeon: Thornton Park, MD;  Location: [facility];                Service: Orthopedics;  Laterality: Left; 05/08/2018: JEJUNOSTOMY     Comment:  Procedure: JEJUNOSTOMY;  Surgeon: Olean Ree, MD;                Location: [facility];  Service: General;; 05/08/2018: LAPAROTOMY; N/A     Comment:  Procedure: EXPLORATORY LAPAROTOMY;  Surgeon: Olean Ree, MD;  Location: [facility];  Service: General;                Laterality: N/A; No date: ROTATOR CUFF REPAIR No date: TONSILLECTOMY No date: TYMPANOPLASTY W/ MASTOIDECTOMY     Comment:  Patient with reports of mastoid surgery (appears to have              had surgery on TM; Left). 05/04/2018: VISCERAL ARTERY INTERVENTION; N/A     Comment:  Procedure: VISCERAL ARTERY INTERVENTION;  Surgeon: Algernon Huxley, MD;  Location: [facility];  Service:               Cardiovascular;  Laterality: N/A;  BMI    Body Mass Index: 18.79 kg/m      Reproductive/Obstetrics                           Anesthesia Physical Anesthesia Plan  ASA: IV  Anesthesia Plan: MAC   Post-op Pain Management:    Induction: Intravenous  PONV Risk Score and Plan: 0 and TIVA and Treatment may vary due to age or medical condition  Airway Management Planned: Nasal Cannula and Natural Airway  Additional Equipment:   Intra-op Plan:   Post-operative Plan:   Informed Consent: I have reviewed the patients History and Physical, chart, labs and discussed the procedure including the risks, benefits and alternatives for the proposed anesthesia with the patient or authorized representative who has  indicated his/her understanding and acceptance.     Dental Advisory Given  Plan  Discussed with: CRNA  Anesthesia Plan Comments:        Anesthesia Quick Evaluation

## 2018-11-26 NOTE — H&P (Signed)
   I have reviewed the patient's H&P and agree with its findings. There have been no interval changes.  Deadrian Toya MD Ophthalmology 

## 2018-11-26 NOTE — Anesthesia Post-op Follow-up Note (Signed)
Anesthesia QCDR form completed.        

## 2018-11-26 NOTE — Transfer of Care (Signed)
Immediate Anesthesia Transfer of Care Note  Patient: Justin Andrews  Procedure(s) Performed: CATARACT EXTRACTION PHACO AND INTRAOCULAR LENS PLACEMENT (IOC) LEFT (Left Eye)  Patient Location: PACU  Anesthesia Type:MAC  Level of Consciousness: awake and alert   Airway & Oxygen Therapy: Patient Spontanous Breathing  Post-op Assessment: Report given to RN and Post -op Vital signs reviewed and stable  Post vital signs: Reviewed and stable  Last Vitals:  Vitals Value Taken Time  BP    Temp    Pulse    Resp    SpO2      Last Pain:  Vitals:   11/26/18 1006  TempSrc: Temporal  PainSc: 6          Complications: No apparent anesthesia complications

## 2018-11-26 NOTE — Op Note (Signed)
  PREOPERATIVE DIAGNOSIS:  Nuclear sclerotic cataract of the LEFT eye.   POSTOPERATIVE DIAGNOSIS:  Nuclear sclerotic cataract of the LEFT eye.   OPERATIVE PROCEDURE: Cataract surgery OS   SURGEON:  Marchia Meiers, MD.   ANESTHESIA:  Anesthesiologist: Alphonsus Sias, MD CRNA: Allean Found, CRNA; Caryl Asp, CRNA  1.      Managed anesthesia care. 2.     0.28ml of Shugarcaine was instilled following the paracentesis   COMPLICATIONS:  None.   TECHNIQUE:   Divide and conquer   DESCRIPTION OF PROCEDURE:  The patient was examined and consented in the preoperative holding area where the aforementioned topical anesthesia was applied to the LEFT eye and then brought back to the Operating Room where the left eye was prepped and draped in the usual sterile ophthalmic fashion and a lid speculum was placed. A paracentesis was created with the side port blade, the anterior chamber was washed out with trypan blue to stain the anterior capsule, and the anterior chamber was filled with viscoelastic. A near clear corneal incision was performed with the steel keratome. A continuous curvilinear capsulorrhexis was performed with a cystotome followed by the capsulorrhexis forceps. Hydrodissection and hydrodelineation were carried out with BSS on a blunt cannula. The lens was removed in a divide and conquer  technique and the remaining cortical material was removed with the irrigation-aspiration handpiece. The capsular bag was inflated with viscoelastic and the lens was placed in the capsular bag without complication. The remaining viscoelastic was removed from the eye with the irrigation-aspiration handpiece. The wounds were hydrated. The anterior chamber was flushed and the eye was inflated to physiologic pressure. 0.36ml Vigamox was placed in the anterior chamber. The wounds were found to be water tight. The eye was dressed with Vigamox. The patient was given protective glasses to wear throughout the day and a  shield with which to sleep tonight. The patient was also given drops with which to begin a drop regimen today and will follow-up with me in one day. Implant Name Type Inv. Item Serial No. Manufacturer Lot No. LRB No. Used Action  LENS IOL DIOP 20.5 - [id number] 2001 Intraocular Lens LENS IOL DIOP 20.5 [id number] 2001 [employer]  Left 1 Implanted    Procedure(s) with comments: CATARACT EXTRACTION PHACO AND INTRAOCULAR LENS PLACEMENT (IOC) LEFT (Left) - [place] 01:03.5 CDE 11.13 Fluid Pack Lot # [id number]  Electronically signed: Marchia Meiers 11/26/2018 12:33 PM

## 2018-11-26 NOTE — Anesthesia Postprocedure Evaluation (Signed)
Anesthesia Post Note  Patient: BYRON PEACOCK  Procedure(s) Performed: CATARACT EXTRACTION PHACO AND INTRAOCULAR LENS PLACEMENT (IOC) LEFT (Left Eye)  Patient location during evaluation: Phase II Anesthesia Type: MAC Level of consciousness: awake and alert Pain management: pain level controlled Vital Signs Assessment: post-procedure vital signs reviewed and stable Respiratory status: spontaneous breathing, nonlabored ventilation and respiratory function stable Cardiovascular status: blood pressure returned to baseline and stable Postop Assessment: no apparent nausea or vomiting Anesthetic complications: no     Last Vitals:  Vitals:   11/26/18 1006 11/26/18 1212  BP: (!) 148/78 (!) 151/78  Pulse: 65 62  Resp: 20 16  Temp: 36.9 C 36.6 C  SpO2: 96% 92%    Last Pain:  Vitals:   11/26/18 1212  TempSrc: Temporal  PainSc: 0-No pain                 Alphonsus Sias

## 2019-02-10 ENCOUNTER — Telehealth: Payer: Self-pay

## 2019-02-10 NOTE — Telephone Encounter (Signed)
Verbal order given to Dr. Gilford Rile for Palliative care.  Fiorella Hanahan,cma

## 2019-02-10 NOTE — Telephone Encounter (Signed)
Verbal order can be given for palliative care.

## 2019-02-23 ENCOUNTER — Telehealth: Payer: Self-pay | Admitting: Nurse Practitioner

## 2019-02-23 NOTE — Telephone Encounter (Signed)
Called Kalman Drape, SW (guaridan) to schedule Palliative Consult, no answer - left message with reason for call along with my contact information

## 2019-03-02 ENCOUNTER — Telehealth: Payer: Self-pay | Admitting: Nurse Practitioner

## 2019-03-02 NOTE — Telephone Encounter (Signed)
Called patient's guardian Debbora Presto (SW) office number, no answer - left message with reason for call and left my name and contact number.  I also called Rhesha's cell number, no answer and unable to leave a message due to mailbox was full.

## 2019-03-02 NOTE — Telephone Encounter (Signed)
I also called Rhesha's supervisor, Rex Kras (not sure of the last name) at [phone], no answer - left message with my contact information.

## 2019-03-03 ENCOUNTER — Telehealth: Payer: Self-pay | Admitting: Nurse Practitioner

## 2019-03-03 NOTE — Telephone Encounter (Signed)
Spoke with Webb Laws, Director of [facility], and have scheduled an [facility] for 03/30/19 @ 3 PM.

## 2019-03-30 ENCOUNTER — Other Ambulatory Visit: Payer: Self-pay

## 2019-03-30 ENCOUNTER — Telehealth: Payer: Self-pay | Admitting: Nurse Practitioner

## 2019-03-30 ENCOUNTER — Other Ambulatory Visit: Payer: Medicare HMO | Admitting: Nurse Practitioner

## 2019-03-30 NOTE — Telephone Encounter (Signed)
I attempted to make an initial face to face initial palliative care visit at [facility], no answer at any of the doors, I attempted to call multiple times Ms. Span. Waited >30 minutes at residence. I called Guardian Charlestine Night message left. I contacted Ms. McKay when left notified visit not made due to no one answering phone or door at [facility]. Will reschedule

## 2019-04-05 ENCOUNTER — Telehealth: Payer: Self-pay | Admitting: Family Medicine

## 2019-04-05 NOTE — Telephone Encounter (Signed)
Transferred to [employer] and said that he couldn't breath because he didn't have his morphine

## 2019-04-05 NOTE — Telephone Encounter (Addendum)
I have not seen him recently. He needs to complete a visit. I also do not prescribe morphine. He would need to be referred to a pain specialist. Please get him scheduled for follow-up. Please also find out how often he has been taking the morphine. He was previously followed by hospice and they may have been prescribing this medication for him.

## 2019-04-05 NOTE — Telephone Encounter (Signed)
Patient stated it is painful to breathe. informed him of what PCP stated, he wanted an appointment. Appointment was given to patient for tomorrow. Patient stated he will wait until then. Patient was not having labored breathing or having breathing issues. He stated he wanted his morphine so he can breathe. FYI

## 2019-04-05 NOTE — Telephone Encounter (Signed)
Joann called from [facility]. Pt is out of morphine and needs more

## 2019-04-05 NOTE — Telephone Encounter (Signed)
Unable to reach patient nor facility.

## 2019-04-05 NOTE — Telephone Encounter (Signed)
Refill request for morphine, last seen 07-28-2018  Please advise.

## 2019-04-05 NOTE — Telephone Encounter (Signed)
Left message for patient to return call back.  

## 2019-04-06 ENCOUNTER — Telehealth: Payer: Self-pay | Admitting: Nurse Practitioner

## 2019-04-06 ENCOUNTER — Ambulatory Visit: Payer: Medicare Other | Admitting: Family Medicine

## 2019-04-06 NOTE — Telephone Encounter (Signed)
I attempted to call Webb Laws (Director of [facility]) [phone], appears like a fax picked up and unable to leave a message. I called Charlestine Night, SW ([place]) of [place]. [phone] with message left to contact about scheduling appointment by phone since last in person visit was a no show

## 2019-04-07 ENCOUNTER — Other Ambulatory Visit: Payer: Self-pay

## 2019-04-07 ENCOUNTER — Encounter: Payer: Self-pay | Admitting: Family Medicine

## 2019-04-07 ENCOUNTER — Other Ambulatory Visit: Payer: Self-pay | Admitting: Family Medicine

## 2019-04-07 ENCOUNTER — Ambulatory Visit (INDEPENDENT_AMBULATORY_CARE_PROVIDER_SITE_OTHER): Payer: Medicare HMO | Admitting: Family Medicine

## 2019-04-07 VITALS — BP 140/70 | HR 67 | Temp 97.1°F | Ht 67.0 in | Wt 119.0 lb

## 2019-04-07 DIAGNOSIS — I480 Paroxysmal atrial fibrillation: Secondary | ICD-10-CM

## 2019-04-07 DIAGNOSIS — R8290 Unspecified abnormal findings in urine: Secondary | ICD-10-CM

## 2019-04-07 DIAGNOSIS — E43 Unspecified severe protein-calorie malnutrition: Secondary | ICD-10-CM | POA: Diagnosis not present

## 2019-04-07 DIAGNOSIS — R634 Abnormal weight loss: Secondary | ICD-10-CM | POA: Diagnosis not present

## 2019-04-07 DIAGNOSIS — M8949 Other hypertrophic osteoarthropathy, multiple sites: Secondary | ICD-10-CM

## 2019-04-07 DIAGNOSIS — R0600 Dyspnea, unspecified: Secondary | ICD-10-CM

## 2019-04-07 DIAGNOSIS — M159 Polyosteoarthritis, unspecified: Secondary | ICD-10-CM

## 2019-04-07 DIAGNOSIS — G8929 Other chronic pain: Secondary | ICD-10-CM | POA: Diagnosis not present

## 2019-04-07 DIAGNOSIS — C4322 Malignant melanoma of left ear and external auricular canal: Secondary | ICD-10-CM

## 2019-04-07 DIAGNOSIS — F32A Depression, unspecified: Secondary | ICD-10-CM

## 2019-04-07 DIAGNOSIS — R0609 Other forms of dyspnea: Secondary | ICD-10-CM

## 2019-04-07 DIAGNOSIS — F419 Anxiety disorder, unspecified: Secondary | ICD-10-CM

## 2019-04-07 DIAGNOSIS — F329 Major depressive disorder, single episode, unspecified: Secondary | ICD-10-CM

## 2019-04-07 DIAGNOSIS — R69 Illness, unspecified: Secondary | ICD-10-CM | POA: Diagnosis not present

## 2019-04-07 LAB — TSH: TSH: 2.35 u[IU]/mL (ref 0.35–4.50)

## 2019-04-07 LAB — CBC WITH DIFFERENTIAL/PLATELET
Basophils Absolute: 0 10*3/uL (ref 0.0–0.1)
Basophils Relative: 0.4 % (ref 0.0–3.0)
Eosinophils Absolute: 0.2 10*3/uL (ref 0.0–0.7)
Eosinophils Relative: 2.5 % (ref 0.0–5.0)
HCT: 34 % — ABNORMAL LOW (ref 39.0–52.0)
Hemoglobin: 11.3 g/dL — ABNORMAL LOW (ref 13.0–17.0)
Lymphocytes Relative: 22.7 % (ref 12.0–46.0)
Lymphs Abs: 2.1 10*3/uL (ref 0.7–4.0)
MCHC: 33.3 g/dL (ref 30.0–36.0)
MCV: 100.4 fl — ABNORMAL HIGH (ref 78.0–100.0)
Monocytes Absolute: 0.7 10*3/uL (ref 0.1–1.0)
Monocytes Relative: 7.4 % (ref 3.0–12.0)
Neutro Abs: 6.1 10*3/uL (ref 1.4–7.7)
Neutrophils Relative %: 67 % (ref 43.0–77.0)
Platelets: 270 10*3/uL (ref 150.0–400.0)
RBC: 3.38 Mil/uL — ABNORMAL LOW (ref 4.22–5.81)
RDW: 14.7 % (ref 11.5–15.5)
WBC: 9.1 10*3/uL (ref 4.0–10.5)

## 2019-04-07 LAB — COMPREHENSIVE METABOLIC PANEL
ALT: 10 U/L (ref 0–53)
AST: 16 U/L (ref 0–37)
Albumin: 4.1 g/dL (ref 3.5–5.2)
Alkaline Phosphatase: 79 U/L (ref 39–117)
BUN: 29 mg/dL — ABNORMAL HIGH (ref 6–23)
CO2: 27 mEq/L (ref 19–32)
Calcium: 9.9 mg/dL (ref 8.4–10.5)
Chloride: 104 mEq/L (ref 96–112)
Creatinine, Ser: 2.15 mg/dL — ABNORMAL HIGH (ref 0.40–1.50)
GFR: 29.49 mL/min — ABNORMAL LOW (ref >60.00)
Glucose, Bld: 101 mg/dL — ABNORMAL HIGH (ref 70–99)
Potassium: 4.5 mEq/L (ref 3.5–5.1)
Sodium: 137 mEq/L (ref 135–145)
Total Bilirubin: 0.8 mg/dL (ref 0.2–1.2)
Total Protein: 7.2 g/dL (ref 6.0–8.3)

## 2019-04-07 LAB — POCT URINALYSIS DIPSTICK
Bilirubin, UA: NEGATIVE
Blood, UA: NEGATIVE
Glucose, UA: NEGATIVE
Ketones, UA: NEGATIVE
Leukocytes, UA: NEGATIVE
Nitrite, UA: NEGATIVE
Protein, UA: POSITIVE — AB
Spec Grav, UA: 1.015 (ref 1.010–1.025)
Urobilinogen, UA: 0.2 E.U./dL
pH, UA: 6 (ref 5.0–8.0)

## 2019-04-07 LAB — SEDIMENTATION RATE: Sed Rate: 23 mm/hr — ABNORMAL HIGH (ref 0–20)

## 2019-04-07 MED ORDER — MIRTAZAPINE 7.5 MG PO TABS: 7.5000 mg | ORAL_TABLET | Freq: Every day | ORAL | 1 refills | Status: DC

## 2019-04-07 MED ORDER — TRAMADOL HCL 50 MG PO TABS: 50.0000 mg | ORAL_TABLET | Freq: Two times a day (BID) | ORAL | 0 refills | Status: DC | PRN

## 2019-04-07 MED ORDER — BUDESONIDE-FORMOTEROL FUMARATE 80-4.5 MCG/ACT IN AERO: 2.0000 | INHALATION_SPRAY | Freq: Two times a day (BID) | RESPIRATORY_TRACT | 3 refills | Status: DC

## 2019-04-07 NOTE — Assessment & Plan Note (Signed)
Sinus rhythm today.  He needs to follow-up with cardiology given that he is on amiodarone.  Referral placed.

## 2019-04-07 NOTE — Addendum Note (Signed)
Addended by: Tor Netters I on: 04/07/2019 03:29 PM   Modules accepted: Orders

## 2019-04-07 NOTE — Patient Instructions (Signed)
Nice to see you. Please consider quitting smoking. We are going to start on Symbicort for your breathing. We will start you on tramadol for her chronic pain.  Once your labs come back we can consider increasing your frequency of tramadol. We are going to start you on Remeron for depression and appetite stimulation. We have referred you to dermatology and cardiology.

## 2019-04-07 NOTE — Assessment & Plan Note (Signed)
Discussed diet and adding in high-calorie foods.  Lab work as outlined below.

## 2019-04-07 NOTE — Assessment & Plan Note (Signed)
Discussed adding high calorie foods and essentially eating whatever he wants given his weight loss and borderline low BMI.  We will add Remeron to see if that helps with appetite.

## 2019-04-07 NOTE — Assessment & Plan Note (Signed)
Continues to have depression.  Denies SI.  We will add Remeron to see if that is beneficial.  Follow-up in 1 month.

## 2019-04-07 NOTE — Progress Notes (Signed)
[name], MD Phone: [phone]  Justin Andrews is a 85 y.o. male who presents today for f/u.  Melanoma: Patient has not seen dermatology since he went on hospice.  Has been off of hospice for the last month.  He is due to see dermatology and needs a referral.  Chronic shortness of breath: Patient continues to smoke.  He has chronic shortness of breath and chronic cough.  He has wheezing.  He has not used any inhalers or breathing treatments recently.  Feels as though his breathing worsened about a year ago.  No chest pain.  A. fib: No palpitations.  He is currently on metoprolol and and amiodarone.  He has not seen cardiology in some time.  History of anemia: Due for CBC to be rechecked.  Weight loss: Patient reports a good appetite though is not eating very many meals.  He does feel depressed.  No SI.  He is no longer on Remeron.  He does have a history of melanoma and a duodenal ulcer.  Social History   Tobacco Use  Smoking Status Current Every Day Smoker  . Packs/day: 1.00  . Years: 70.00  . Pack years: 70.00  . Types: Cigarettes  Smokeless Tobacco Never Used     ROS see history of present illness  Objective  Physical Exam Vitals:   04/07/19 0901  BP: 140/70  Pulse: 67  Temp: (!) 97.1 F (36.2 C)    BP Readings from Last 3 Encounters:  04/07/19 140/70  11/26/18 (!) 151/78  09/12/18 133/66   Wt Readings from Last 3 Encounters:  04/07/19 119 lb (54 kg)  11/16/18 120 lb (54.4 kg)  09/06/18 120 lb (54.4 kg)    Physical Exam Constitutional:      General: He is not in acute distress.    Appearance: He is not diaphoretic.  HENT:     Head:   Cardiovascular:     Rate and Rhythm: Normal rate and regular rhythm.     Heart sounds: Normal heart sounds.  Pulmonary:     Effort: Pulmonary effort is normal.     Breath sounds: Normal breath sounds.  Abdominal:     General: Bowel sounds are normal. There is no distension.     Palpations: Abdomen is soft.    Tenderness: There is abdominal tenderness (Mild right upper quadrant soreness). There is no guarding or rebound.  Musculoskeletal:     Right lower leg: No edema.     Left lower leg: No edema.  Skin:    General: Skin is warm and dry.  Neurological:     Mental Status: He is alert.      Assessment/Plan: Please see individual problem list.  Paroxysmal atrial fibrillation (HCC) Sinus rhythm today.  He needs to follow-up with cardiology given that he is on amiodarone.  Referral placed.  DOE (dyspnea on exertion) Chronic issue.  Suspect related to COPD.  We will start him on Symbicort.  We will check labs to ensure that he is not excessively anemic.  Encouraged encourage smoking cessation.  Protein-calorie malnutrition, severe Discussed adding high calorie foods and essentially eating whatever he wants given his weight loss and borderline low BMI.  We will add Remeron to see if that helps with appetite.  Anxiety and depression Continues to have depression.  Denies SI.  We will add Remeron to see if that is beneficial.  Follow-up in 1 month.  Osteoarthritis Patient had been taking morphine through hospice.  I advised the patient that  I do not prescribe morphine for chronic pain.  Also discussed that tramadol would be the strongest thing but I would be willing to prescribe to him and if he would like something stronger he would have to discuss this with the pain specialist.  He declined pain management referral.  Given his most recent kidney function in our system the max dosing is tramadol every 12 hours.  We will start with 50 mg every 12 hours.  Discussed that if his kidney function has improved we may potentially increase the frequency and dose in the future.  Pain contract reviewed and signed by the patient.  UDS obtained today.  Malignant melanoma of left ear [facility]) Refer back to dermatology for follow-up.  Weight loss Discussed diet and adding in high-calorie foods.  Lab work as  outlined below.   Orders Placed This Encounter  Procedures  . Comp Met (CMET)  . CBC w/Diff  . TSH  . Sedimentation rate  . Urine drugs of abuse scrn w alc, routine ([facility] [place])  . Ambulatory referral to Dermatology    Referral Priority:   Routine    Referral Type:   Consultation    Referral Reason:   Specialty Services Required    Requested Specialty:   Dermatology    Number of Visits Requested:   1  . Ambulatory referral to Cardiology    Referral Priority:   Routine    Referral Type:   Consultation    Referral Reason:   Specialty Services Required    Requested Specialty:   Cardiology    Number of Visits Requested:   1  . POCT Urinalysis Dipstick    Meds ordered this encounter  Medications  . DISCONTD: traMADol (ULTRAM) 50 MG tablet    Sig: Take 1 tablet (50 mg total) by mouth every 12 (twelve) hours as needed for moderate pain.    Dispense:  10 tablet    Refill:  0  . DISCONTD: mirtazapine (REMERON) 7.5 MG tablet    Sig: Take 1 tablet (7.5 mg total) by mouth at bedtime.    Dispense:  90 tablet    Refill:  1  . mirtazapine (REMERON) 7.5 MG tablet    Sig: Take 1 tablet (7.5 mg total) by mouth at bedtime.    Dispense:  90 tablet    Refill:  1  . traMADol (ULTRAM) 50 MG tablet    Sig: Take 1 tablet (50 mg total) by mouth every 12 (twelve) hours as needed for moderate pain.    Dispense:  10 tablet    Refill:  0  . budesonide-formoterol (SYMBICORT) 80-4.5 MCG/ACT inhaler    Sig: Inhale 2 puffs into the lungs 2 (two) times daily.    Dispense:  1 Inhaler    Refill:  3    This visit occurred during the SARS-CoV-2 public health emergency.  Safety protocols were in place, including screening questions prior to the visit, additional usage of staff PPE, and extensive cleaning of exam room while observing appropriate contact time as indicated for disinfecting solutions.    I have spent 47 minutes in the care of this patient regarding chart review,  history taking, counseling regarding chronic pain management, discussion of his chronic medical issues, documentation, and placing orders.   [name], MD [place]

## 2019-04-07 NOTE — Assessment & Plan Note (Addendum)
Chronic issue.  Suspect related to COPD.  We will start him on Symbicort.  We will check labs to ensure that he is not excessively anemic.  Encouraged encourage smoking cessation.

## 2019-04-07 NOTE — Assessment & Plan Note (Signed)
Refer back to dermatology for follow-up.

## 2019-04-07 NOTE — Assessment & Plan Note (Signed)
Patient had been taking morphine through hospice.  I advised the patient that I do not prescribe morphine for chronic pain.  Also discussed that tramadol would be the strongest thing but I would be willing to prescribe to him and if he would like something stronger he would have to discuss this with the pain specialist.  He declined pain management referral.  Given his most recent kidney function in our system the max dosing is tramadol every 12 hours.  We will start with 50 mg every 12 hours.  Discussed that if his kidney function has improved we may potentially increase the frequency and dose in the future.  Pain contract reviewed and signed by the patient.  UDS obtained today.

## 2019-04-08 ENCOUNTER — Telehealth: Payer: Self-pay | Admitting: Nurse Practitioner

## 2019-04-08 LAB — URINE DRUGS OF ABUSE SCREEN W ALC, ROUTINE (REF LAB)
Amphetamines, Urine: NEGATIVE ng/mL
Barbiturate Quant, Ur: NEGATIVE ng/mL
Benzodiazepine Quant, Ur: NEGATIVE ng/mL
Cannabinoid Quant, Ur: NEGATIVE ng/mL
Cocaine (Metab.): NEGATIVE ng/mL
Ethanol, Urine: NEGATIVE %
Methadone Screen, Urine: NEGATIVE ng/mL
Opiate Quant, Ur: NEGATIVE ng/mL
PCP Quant, Ur: NEGATIVE ng/mL
Propoxyphene: NEGATIVE ng/mL

## 2019-04-08 LAB — URINALYSIS, MICROSCOPIC ONLY
RBC / HPF: NONE SEEN (ref 0–?)
WBC, UA: NONE SEEN (ref 0–?)

## 2019-04-08 NOTE — Telephone Encounter (Signed)
I called Justin Andrews to reschedule no show appointment for initial PC visit . [name] in agreement to 05/17/2019 @3pm . Contact information provided. I called [name] Guardian updated on appointment and requested that she attend visit also. [name] in agreement. Contact information provided

## 2019-04-15 ENCOUNTER — Other Ambulatory Visit: Payer: Self-pay | Admitting: Family Medicine

## 2019-04-15 MED ORDER — CALCIUM CARBONATE-VITAMIN D 500-200 MG-UNIT PO TABS: 1.0000 | ORAL_TABLET | Freq: Two times a day (BID) | ORAL | 1 refills | Status: DC

## 2019-04-15 MED ORDER — FERROUS SULFATE 325 (65 FE) MG PO TABS: 325.0000 mg | ORAL_TABLET | Freq: Every day | ORAL | 0 refills | Status: DC

## 2019-04-15 MED ORDER — TRAMADOL HCL 50 MG PO TABS: 50.0000 mg | ORAL_TABLET | Freq: Two times a day (BID) | ORAL | 0 refills | Status: DC | PRN

## 2019-04-15 MED ORDER — AMIODARONE HCL 400 MG PO TABS: 200.0000 mg | ORAL_TABLET | Freq: Two times a day (BID) | ORAL | 0 refills | Status: AC

## 2019-04-15 MED ORDER — PANTOPRAZOLE SODIUM 40 MG PO TBEC: DELAYED_RELEASE_TABLET | ORAL | 0 refills | Status: DC

## 2019-04-15 MED ORDER — ONE-DAILY MULTI VITAMINS PO TABS: 1.0000 | ORAL_TABLET | Freq: Every day | ORAL | 1 refills | Status: AC

## 2019-04-15 MED ORDER — TAMSULOSIN HCL 0.4 MG PO CAPS: 0.4000 mg | ORAL_CAPSULE | Freq: Every day | ORAL | 0 refills | Status: DC

## 2019-04-15 MED ORDER — LOPERAMIDE HCL 2 MG PO CAPS: 2.0000 mg | ORAL_CAPSULE | Freq: Four times a day (QID) | ORAL | 0 refills | Status: DC | PRN

## 2019-04-15 MED ORDER — MIRTAZAPINE 7.5 MG PO TABS: 7.5000 mg | ORAL_TABLET | Freq: Every day | ORAL | 1 refills | Status: DC

## 2019-04-15 MED ORDER — METOPROLOL TARTRATE 25 MG PO TABS: 12.5000 mg | ORAL_TABLET | Freq: Two times a day (BID) | ORAL | 0 refills | Status: AC

## 2019-04-15 MED ORDER — ACETAMINOPHEN ER 650 MG PO TBCR: 650.0000 mg | EXTENDED_RELEASE_TABLET | Freq: Three times a day (TID) | ORAL | 1 refills | Status: DC | PRN

## 2019-04-15 MED ORDER — BUDESONIDE-FORMOTEROL FUMARATE 80-4.5 MCG/ACT IN AERO: 2.0000 | INHALATION_SPRAY | Freq: Two times a day (BID) | RESPIRATORY_TRACT | 3 refills | Status: DC

## 2019-04-15 NOTE — Telephone Encounter (Signed)
Please call his living facility and find out when he last took xanax. I did not discuss prescribing this for the patient at his most recent visit and do not think it is the best medication for him to take. I will send in refills of his other medications. He also needs to see cardiology for his amiodarone. I referred him though it does not look like he responded to their calls. They need to call cardiology to set up an appointment.

## 2019-04-15 NOTE — Telephone Encounter (Signed)
Patient was just seen by Dr. Caryl Bis and the following medications have not been refilled; pantoprazole (PROTONIX) 40 MG tablet, Multiple Vitamin (MULTIVITAMIN) tablet, tamsulosin (FLOMAX) 0.4 MG CAPS capsule, ferrous sulfate (FEROSUL) 325 (65 FE) MG tablet(Expired), acetaminophen (TYLENOL) 650 MG CR tablet, metoprolol tartrate (LOPRESSOR) 25 MG tablet(Expired), calcium-vitamin D (OSCAL WITH D) 500-200 MG-UNIT tablet, amiodarone (PACERONE) 400 MG tablet(Expired), mirtazapine (REMERON) 7.5 MG tablet, traMADol (ULTRAM) 50 MG tablet, ALPRAZolam (XANAX) 0.5 MG tablet, budesonide-formoterol (SYMBICORT) 80-4.5,  MCG/ACT inhaler, loperamide (IMODIUM) 2 MG capsule. Please send to [facility], patient does not have enough medication for tomorrow. Most of this medication was ordered at the end of March 2021. Per Patients daughter pharmacy has not received.

## 2019-04-15 NOTE — Telephone Encounter (Signed)
Left message for patient to return call back.  

## 2019-04-15 NOTE — Telephone Encounter (Signed)
Spoken to patient, he stated he hasn't taken xanax for a couple months. He stated it helps him sleep. Informed patient that that is not the medication is used for. He would like a medication for sleep. Also he was instructed to call and schedule appointment with cardioplogy.

## 2019-04-15 NOTE — Telephone Encounter (Signed)
Please advise refills. Last seen 04-07-19

## 2019-04-16 NOTE — Telephone Encounter (Signed)
The remeron that we recently started him on may help him sleep. He should see how he does on that and then we could discuss further at follow-up.

## 2019-04-19 ENCOUNTER — Telehealth: Payer: Self-pay | Admitting: Family Medicine

## 2019-04-19 NOTE — Telephone Encounter (Signed)
Justin Andrews y gray [phone] called asking why Symbicort prescribed needed verification for HIPPA to give out medical information.

## 2019-04-20 ENCOUNTER — Other Ambulatory Visit: Payer: Self-pay | Admitting: Family Medicine

## 2019-04-20 DIAGNOSIS — R809 Proteinuria, unspecified: Secondary | ICD-10-CM

## 2019-04-20 DIAGNOSIS — D7589 Other specified diseases of blood and blood-forming organs: Secondary | ICD-10-CM

## 2019-04-21 ENCOUNTER — Telehealth: Payer: Self-pay | Admitting: Family Medicine

## 2019-04-21 NOTE — Telephone Encounter (Signed)
I called pt guardianship regarding referral to cardiology.

## 2019-04-30 DIAGNOSIS — I5023 Acute on chronic systolic (congestive) heart failure: Secondary | ICD-10-CM | POA: Diagnosis not present

## 2019-04-30 DIAGNOSIS — N184 Chronic kidney disease, stage 4 (severe): Secondary | ICD-10-CM | POA: Diagnosis not present

## 2019-04-30 DIAGNOSIS — I10 Essential (primary) hypertension: Secondary | ICD-10-CM | POA: Diagnosis not present

## 2019-04-30 DIAGNOSIS — R0789 Other chest pain: Secondary | ICD-10-CM | POA: Diagnosis not present

## 2019-04-30 DIAGNOSIS — R69 Illness, unspecified: Secondary | ICD-10-CM | POA: Diagnosis not present

## 2019-04-30 DIAGNOSIS — E785 Hyperlipidemia, unspecified: Secondary | ICD-10-CM | POA: Diagnosis not present

## 2019-04-30 DIAGNOSIS — I1310 Hypertensive heart and chronic kidney disease without heart failure, with stage 1 through stage 4 chronic kidney disease, or unspecified chronic kidney disease: Secondary | ICD-10-CM | POA: Diagnosis not present

## 2019-04-30 DIAGNOSIS — R079 Chest pain, unspecified: Secondary | ICD-10-CM | POA: Diagnosis not present

## 2019-04-30 DIAGNOSIS — R0602 Shortness of breath: Secondary | ICD-10-CM | POA: Diagnosis not present

## 2019-04-30 DIAGNOSIS — I25119 Atherosclerotic heart disease of native coronary artery with unspecified angina pectoris: Secondary | ICD-10-CM | POA: Diagnosis not present

## 2019-04-30 DIAGNOSIS — Z20822 Contact with and (suspected) exposure to covid-19: Secondary | ICD-10-CM | POA: Diagnosis not present

## 2019-04-30 DIAGNOSIS — J449 Chronic obstructive pulmonary disease, unspecified: Secondary | ICD-10-CM | POA: Diagnosis not present

## 2019-04-30 DIAGNOSIS — I255 Ischemic cardiomyopathy: Secondary | ICD-10-CM | POA: Diagnosis not present

## 2019-04-30 DIAGNOSIS — I499 Cardiac arrhythmia, unspecified: Secondary | ICD-10-CM | POA: Diagnosis not present

## 2019-04-30 DIAGNOSIS — I509 Heart failure, unspecified: Secondary | ICD-10-CM | POA: Diagnosis not present

## 2019-04-30 DIAGNOSIS — N400 Enlarged prostate without lower urinary tract symptoms: Secondary | ICD-10-CM | POA: Diagnosis not present

## 2019-05-01 DIAGNOSIS — I255 Ischemic cardiomyopathy: Secondary | ICD-10-CM | POA: Diagnosis not present

## 2019-05-01 DIAGNOSIS — R69 Illness, unspecified: Secondary | ICD-10-CM | POA: Diagnosis not present

## 2019-05-01 DIAGNOSIS — Z20822 Contact with and (suspected) exposure to covid-19: Secondary | ICD-10-CM | POA: Diagnosis not present

## 2019-05-01 DIAGNOSIS — I10 Essential (primary) hypertension: Secondary | ICD-10-CM | POA: Diagnosis not present

## 2019-05-01 DIAGNOSIS — E785 Hyperlipidemia, unspecified: Secondary | ICD-10-CM | POA: Diagnosis not present

## 2019-05-01 DIAGNOSIS — I509 Heart failure, unspecified: Secondary | ICD-10-CM | POA: Diagnosis not present

## 2019-05-01 DIAGNOSIS — N400 Enlarged prostate without lower urinary tract symptoms: Secondary | ICD-10-CM | POA: Diagnosis not present

## 2019-05-01 DIAGNOSIS — I499 Cardiac arrhythmia, unspecified: Secondary | ICD-10-CM | POA: Diagnosis not present

## 2019-05-01 DIAGNOSIS — N184 Chronic kidney disease, stage 4 (severe): Secondary | ICD-10-CM | POA: Diagnosis not present

## 2019-05-01 DIAGNOSIS — J449 Chronic obstructive pulmonary disease, unspecified: Secondary | ICD-10-CM | POA: Diagnosis not present

## 2019-05-01 DIAGNOSIS — R079 Chest pain, unspecified: Secondary | ICD-10-CM | POA: Diagnosis not present

## 2019-05-01 DIAGNOSIS — I1310 Hypertensive heart and chronic kidney disease without heart failure, with stage 1 through stage 4 chronic kidney disease, or unspecified chronic kidney disease: Secondary | ICD-10-CM | POA: Diagnosis not present

## 2019-05-01 DIAGNOSIS — R0602 Shortness of breath: Secondary | ICD-10-CM | POA: Diagnosis not present

## 2019-05-01 DIAGNOSIS — I5023 Acute on chronic systolic (congestive) heart failure: Secondary | ICD-10-CM | POA: Diagnosis not present

## 2019-05-01 DIAGNOSIS — I25119 Atherosclerotic heart disease of native coronary artery with unspecified angina pectoris: Secondary | ICD-10-CM | POA: Diagnosis not present

## 2019-05-04 DIAGNOSIS — N189 Chronic kidney disease, unspecified: Secondary | ICD-10-CM | POA: Diagnosis not present

## 2019-05-04 DIAGNOSIS — N400 Enlarged prostate without lower urinary tract symptoms: Secondary | ICD-10-CM | POA: Diagnosis not present

## 2019-05-04 DIAGNOSIS — I499 Cardiac arrhythmia, unspecified: Secondary | ICD-10-CM | POA: Diagnosis not present

## 2019-05-04 DIAGNOSIS — R0789 Other chest pain: Secondary | ICD-10-CM | POA: Diagnosis not present

## 2019-05-04 DIAGNOSIS — I129 Hypertensive chronic kidney disease with stage 1 through stage 4 chronic kidney disease, or unspecified chronic kidney disease: Secondary | ICD-10-CM | POA: Diagnosis not present

## 2019-05-04 DIAGNOSIS — I10 Essential (primary) hypertension: Secondary | ICD-10-CM | POA: Diagnosis not present

## 2019-05-04 DIAGNOSIS — K219 Gastro-esophageal reflux disease without esophagitis: Secondary | ICD-10-CM | POA: Diagnosis not present

## 2019-05-04 DIAGNOSIS — N179 Acute kidney failure, unspecified: Secondary | ICD-10-CM | POA: Diagnosis not present

## 2019-05-04 DIAGNOSIS — I255 Ischemic cardiomyopathy: Secondary | ICD-10-CM | POA: Diagnosis not present

## 2019-05-04 DIAGNOSIS — I5023 Acute on chronic systolic (congestive) heart failure: Secondary | ICD-10-CM | POA: Diagnosis not present

## 2019-05-04 DIAGNOSIS — R69 Illness, unspecified: Secondary | ICD-10-CM | POA: Diagnosis not present

## 2019-05-04 DIAGNOSIS — J449 Chronic obstructive pulmonary disease, unspecified: Secondary | ICD-10-CM | POA: Diagnosis not present

## 2019-05-04 DIAGNOSIS — R0602 Shortness of breath: Secondary | ICD-10-CM | POA: Diagnosis not present

## 2019-05-04 DIAGNOSIS — I25119 Atherosclerotic heart disease of native coronary artery with unspecified angina pectoris: Secondary | ICD-10-CM | POA: Diagnosis not present

## 2019-05-04 DIAGNOSIS — R079 Chest pain, unspecified: Secondary | ICD-10-CM | POA: Diagnosis not present

## 2019-05-12 DIAGNOSIS — I255 Ischemic cardiomyopathy: Secondary | ICD-10-CM | POA: Diagnosis not present

## 2019-05-12 DIAGNOSIS — N189 Chronic kidney disease, unspecified: Secondary | ICD-10-CM | POA: Diagnosis not present

## 2019-05-12 DIAGNOSIS — Z136 Encounter for screening for cardiovascular disorders: Secondary | ICD-10-CM | POA: Diagnosis not present

## 2019-05-17 ENCOUNTER — Encounter: Payer: Self-pay | Admitting: Nurse Practitioner

## 2019-05-17 ENCOUNTER — Other Ambulatory Visit: Payer: Medicare HMO | Admitting: Nurse Practitioner

## 2019-05-17 ENCOUNTER — Other Ambulatory Visit: Payer: Self-pay

## 2019-05-17 DIAGNOSIS — J449 Chronic obstructive pulmonary disease, unspecified: Secondary | ICD-10-CM | POA: Diagnosis not present

## 2019-05-17 DIAGNOSIS — Z515 Encounter for palliative care: Secondary | ICD-10-CM

## 2019-05-17 NOTE — Progress Notes (Signed)
[facility] Palliative Care Consult Note Telephone: [phone]  Fax: [phone]  PATIENT NAME: Justin Andrews DOB: 1934-12-21 MRN: [id number]  PRIMARY CARE PROVIDER:   Leone Haven, MD  REFERRING PROVIDER:  Leone Haven, MD [address] [place],  [place] [address]   I was asked by Dr Justin Andrews to see Justin Andrews for [place] for goals of care  RESPONSIBLE PARTY:   Webb Laws [phone] Caregiver; [facility] [place] [phone]  RECOMMENDATIONS and PLAN:  1. ACP: DNR, placed in Vyn[place]; medical goals of care focus on comfort   2. Palliative care encounter; Palliative medicine team will continue to support patient, patient's family, and medical team. Visit consisted of counseling and education dealing with the complex and emotionally intense issues of symptom management and palliative care in the setting of serious and potentially life-threatening illness  I spent 90 minutes providing this consultation,  from 3:00pm to 4:30pm. More than 50% of the time in this consultation was spent coordinating communication.   HISTORY OF PRESENT ILLNESS:  Justin Andrews is a 85 y.o. year old male with multiple medical problems including Atrial fibrillation, hypertension, malignant melanoma left ear, squamous cell carcinoma of face, basal cell carcinoma , chronic kidney disease, COPD, anemia, prostate nodule, pulmonary edema, renal cyst, history of respiratory failure, malnutrition, history of GI bleed, gerd, duodenal disorder, restless leg syndrome, alcohol abuse, tobacco abuse, anxiety, depression, visceral artery intervention, tympanoplasty with mastoidectomy, tonsillectomy, rotator cuff repair, laparoscopy, jejunostomy, left intramedullary nail intertrochanteric, right and left cataract extraction with phaco, ankle surgery secondary to fracture.Justin Andrews is currently residing at a group home. Justin Andrews is using a  rolling walker. Justin Andrews does require ADL assistance. Justin Andrews feeds himself an appetite continues to be poor. Justin Andrews has lost about 10 lb in the last 4 weeks. Justin Andrews has had a recent hospitalization at the hospital in [place] for intoxication and heart attack her [place] caregiver. At present Justin. Andrews is sitting In the seat on his rolling walker. Justin. Andrews appears very thin with muscle wasting, comfortable. No visitors present. Justin Andrews caregiver Justin. Andrews has been residing at her group home for about a year. He was on hospice for about 6 months was last time being about three months ago. We talked about Justin. Andrews increasing his appetite, weight stable and discharge due to stability. We talked about since hospice discharged Justin Andrews has been declining. Justin Andrews has been requiring more assistance with ADLs. Justin Andrews has had recent hospitalization for myocardial infarction. Justin Andrews appetite has decreased. Justin Andrews endorses that he has gone down one two two pants sizes. Justin. Andrews and I talked about his appetite. We talked about two years ago he weighed 168 lb and currently now 124 lb. We talked about the last month of about 10 pound weight loss. We we talked about decreasing in pants size in clothes having to get new clothes. We talked about challenges reciting it group home with limitations. We talked about [place] Justin[place] Andrews and door says that [place]. He currently is residing in a group home in [place] on a property that has a lot of land. We talked about options including interest in nature, listening to the birds. Justin. Andrews endorses that he was not interested. We talked about medical goals of care including aggressive versus  conservative versus comfort care. We talked about DNR is in place and we'll put in [place]. We talked about concern for decline. Justin Andrews endorses that he just continues to get worse  and which Hospice would return. Justin Andrews endorses that he feels like he is progressing to the end part of his life. We talked about symptoms of pain which he denies. We talked about shortness of breath which he does experience with exertion. concern for decline. Justin Andrews and or says that he just continues to get worse and which hospice would return. Justin Andrews endorse is that he feels like he is progressing to the end part of his life. We talked about symptoms of pain what she did nice. We talked about shortness of breath which she does experience with exertion. Since hospice discharge he no longer takes Roxanol for dyspnea. We talked about energy conservation. We talked about role of palliative care and plan of care. Therapeutic listening, emotional support provided. Contact information. Discussed with Justin Andrews caregiver will follow up with hospice positions to review case to see about hospice eligibility. if determined hospice eligible will proceed with hospice referral. If determined not hospice eligible will schedule follow-up palliative care visit in 4 weeks or sooner said he declined. Justin Andrews in agreement. Questions answered to satisfaction.  Palliative Care was asked to help address goals of care.   CODE STATUS: DNR  PPS: 40% HOSPICE ELIGIBILITY/DIAGNOSIS: TBD  PAST MEDICAL HISTORY:  Past Medical History:  Diagnosis Date  . Alcohol abuse   . Anemia   . Anxiety   . Balance problem   . C. difficile diarrhea 11/2017  . Cancer ([place])    skin  . Chronic cough   . Chronic kidney disease (CKD), stage III (moderate)   . COPD (chronic obstructive pulmonary disease) ([place])   . CRD (chronic renal disease)   . Depression   . Duodenal disorder   . Dyspnea   . Dysrhythmia    atrial fib  . Environmental and seasonal allergies   . GERD (gastroesophageal reflux disease)   . GI bleed    a. bleeding duodenal ulcers in 04/2018 s/p cauterization and duodenalplasty  . History of melanoma   .  Hypertension   . Malnutrition ([place])   . Osteoarthritis   . PAF (paroxysmal atrial fibrillation) ([place])    a. initially noted on 05/06/2018  . Pneumonia   . Prostate nodule   . Protein in urine   . Pulmonary edema   . Renal cyst   . Respiratory failure ([place])   . Tobacco abuse   . Wheezing     SOCIAL HX:  Social History   Tobacco Use  . Smoking status: Current Every Day Smoker    Packs/day: 1.00    Years: 70.00    Pack years: 70.00    Types: Cigarettes  . Smokeless tobacco: Never Used  Substance Use Topics  . Alcohol use: No    ALLERGIES:  Allergies  Allergen Reactions  . Aspirin Other (See Comments)    bleeding     PERTINENT MEDICATIONS:  Outpatient Encounter Medications as of 05/17/2019  Medication Sig  . acetaminophen (TYLENOL) 650 MG CR tablet Take 1 tablet (650 mg total) by mouth every 8 (eight) hours as needed for pain.  Marland Kitchen albuterol (PROVENTIL) (2.5 MG/3ML) 0.083% nebulizer solution Take 2.5 mg by nebulization every 4 (four) hours as needed for wheezing or shortness of breath.  . ALPRAZolam (XANAX) 0.5 MG tablet Take 0.5 mg by mouth at bedtime.  Marland Kitchen  amiodarone (PACERONE) 400 MG tablet Take 0.5 tablets (200 mg total) by mouth 2 (two) times daily.  . budesonide-formoterol (SYMBICORT) 80-4.5 MCG/ACT inhaler Inhale 2 puffs into the lungs 2 (two) times daily.  . calcium-vitamin D (OSCAL WITH D) 500-200 MG-UNIT tablet Take 1 tablet by mouth 2 (two) times daily.  . DUREZOL 0.05 % EMUL Place 1 drop into the right eye daily.  . ferrous sulfate (FEROSUL) 325 (65 FE) MG tablet Take 1 tablet (325 mg total) by mouth daily.  Marland Kitchen loperamide (IMODIUM) 2 MG capsule Take 1 capsule (2 mg total) by mouth 4 (four) times daily as needed for diarrhea or loose stools.  . metoprolol tartrate (LOPRESSOR) 25 MG tablet Take 0.5 tablets (12.5 mg total) by mouth 2 (two) times daily.  . mirtazapine (REMERON) 7.5 MG tablet Take 1 tablet (7.5 mg total) by mouth at bedtime.  . Multiple Vitamin  (MULTIVITAMIN) tablet Take 1 tablet by mouth daily.  . pantoprazole (PROTONIX) 40 MG tablet TAKE 1 TABLET DAILY AT 7:00 AM ON AN EMPTY STOMACH  . tamsulosin (FLOMAX) 0.4 MG CAPS capsule Take 1 capsule (0.4 mg total) by mouth daily.  Marland Kitchen thiamine 100 MG tablet Take 1 tablet (100 mg total) by mouth daily.  . traMADol (ULTRAM) 50 MG tablet Take 1 tablet (50 mg total) by mouth every 12 (twelve) hours as needed for moderate pain.   No facility-administered encounter medications on file as of 05/17/2019.    PHYSICAL EXAM:   General: NAD, frail appearing, thin, male Cardiovascular: regular rate and rhythm Pulmonary: clear ant fields Neurological: Walks with walker  Christin Ihor Gully, NP

## 2019-05-18 ENCOUNTER — Telehealth: Payer: Self-pay | Admitting: Nurse Practitioner

## 2019-05-18 ENCOUNTER — Telehealth: Payer: Self-pay

## 2019-05-18 NOTE — Telephone Encounter (Signed)
Agree with verbal order for hospice.

## 2019-05-18 NOTE — Telephone Encounter (Signed)
I called Justin Andrews DSS guardian updated on hospice eligibility, okay to proceed, I called and left a message for [facility] caregiver with update on hospice eligibility. I called Dr Mary Sella office, received verbal order from Gae Bon to proceed with hospice referral. Hospice notified

## 2019-05-24 ENCOUNTER — Telehealth: Payer: Self-pay | Admitting: Family Medicine

## 2019-05-24 NOTE — Telephone Encounter (Signed)
Received call from Dr Charlyne Quale the patients dermatologist.  He wanted to check in on the patient's goals of care.  I discussed that the patient was on hospice though he did eventually graduate from hospice though recently went back on hospice.  The patient has an appointment with dermatology in the next several days.  I discussed that my sense is that the patient has not wanted to do anything aggressive regarding his melanoma given his lack of follow-up and his history of medical noncompliance.

## 2019-05-25 ENCOUNTER — Ambulatory Visit: Payer: Medicare HMO | Admitting: Cardiology

## 2019-05-25 DIAGNOSIS — Z86006 Personal history of melanoma in-situ: Secondary | ICD-10-CM | POA: Diagnosis not present

## 2019-05-25 DIAGNOSIS — C4322 Malignant melanoma of left ear and external auricular canal: Secondary | ICD-10-CM | POA: Diagnosis not present

## 2019-05-25 DIAGNOSIS — D492 Neoplasm of unspecified behavior of bone, soft tissue, and skin: Secondary | ICD-10-CM | POA: Diagnosis not present

## 2019-05-25 DIAGNOSIS — Z85820 Personal history of malignant melanoma of skin: Secondary | ICD-10-CM | POA: Diagnosis not present

## 2019-06-01 ENCOUNTER — Telehealth (INDEPENDENT_AMBULATORY_CARE_PROVIDER_SITE_OTHER): Payer: Medicare HMO | Admitting: Family Medicine

## 2019-06-01 ENCOUNTER — Encounter: Payer: Self-pay | Admitting: Family Medicine

## 2019-06-01 ENCOUNTER — Other Ambulatory Visit: Payer: Self-pay

## 2019-06-01 DIAGNOSIS — I252 Old myocardial infarction: Secondary | ICD-10-CM | POA: Diagnosis not present

## 2019-06-01 DIAGNOSIS — M8949 Other hypertrophic osteoarthropathy, multiple sites: Secondary | ICD-10-CM

## 2019-06-01 DIAGNOSIS — Z972 Presence of dental prosthetic device (complete) (partial): Secondary | ICD-10-CM | POA: Diagnosis not present

## 2019-06-01 DIAGNOSIS — M159 Polyosteoarthritis, unspecified: Secondary | ICD-10-CM

## 2019-06-01 MED ORDER — TRAMADOL HCL 50 MG PO TABS: 50.0000 mg | ORAL_TABLET | Freq: Two times a day (BID) | ORAL | 0 refills | Status: AC | PRN

## 2019-06-01 NOTE — Assessment & Plan Note (Signed)
Intermittently having symptoms.  He has followed up with cardiology.  Patient will call the cardiology office to have them fax [place] a copy of his most recent note.  I will have the CMA contact the patient to follow-up on this.  He will seek medical attention for any persistent symptoms.

## 2019-06-01 NOTE — Assessment & Plan Note (Signed)
Discussed he could be getting this every 12 hours.  We will change the prescription instructions to reflect that he can get it twice a day.  We will have the CMA contact the group home to inform them of this.

## 2019-06-01 NOTE — Progress Notes (Signed)
Dependsif he has dental insurance added to his plan. I can make a Community referral to see if they can help him.

## 2019-06-01 NOTE — Assessment & Plan Note (Signed)
Patient lost his dentures.  I will check with office RN to see if she knows whether or not Medicare will pay for dentures.

## 2019-06-01 NOTE — Progress Notes (Signed)
Virtual Visit via telephone Note  This visit type was conducted due to national recommendations for restrictions regarding the COVID-19 pandemic (e.g. social distancing).  This format is felt to be most appropriate for this patient at this time.  All issues noted in this document were discussed and addressed.  No physical exam was performed (except for noted visual exam findings with Video Visits).   I connected with Justin Andrews today at 12:30 PM EDT by telephone and verified that I am speaking with the correct person using two identifiers. Location patient: home Location provider: work  Persons participating in the virtual visit: patient, provider  I discussed the limitations, risks, security and privacy concerns of performing an evaluation and management service by telephone and the availability of in person appointments. I also discussed with the patient that there may be a patient responsible charge related to this service. The patient expressed understanding and agreed to proceed.  Interactive audio and video telecommunications were attempted between this provider and patient, however failed, due to patient having technical difficulties OR patient did not have access to video capability.  We continued and completed visit with audio only.   Reason for visit: f/u  HPI: Chronic pain: Patient notes he has pain from his previously broken hip.  He is getting tramadol once a day at his group home.  He wonders about getting another dose per day.  It is prescribed to be given every 12 hours as needed.  Dentures: Patient notes he lost his pair of dentures.  He wonders if Medicare will pay for them to be replaced.  He is having trouble eating solids without his dentures.  MI: Patient had an MI and was hospitalized in [place].  He continues to have some dyspnea particularly when he wakes up at night.  He feels as though his dyspnea is a little worse.  He has some on exertion.  Notes a couple of  episodes of chest discomfort radiating to his shoulder and back.  Last occurred 3 days ago.  None at this time.  He was sitting down when it occurred.  He reports he followed up with cardiology in [place].   ROS: See pertinent positives and negatives per HPI.  Past Medical History:  Diagnosis Date  . Alcohol abuse   . Anemia   . Anxiety   . Balance problem   . C. difficile diarrhea 11/2017  . Cancer ([place])    skin  . Chronic cough   . Chronic kidney disease (CKD), stage III (moderate)   . COPD (chronic obstructive pulmonary disease) ([place])   . CRD (chronic renal disease)   . Depression   . Duodenal disorder   . Dyspnea   . Dysrhythmia    atrial fib  . Environmental and seasonal allergies   . GERD (gastroesophageal reflux disease)   . GI bleed    a. bleeding duodenal ulcers in 04/2018 s/p cauterization and duodenalplasty  . History of melanoma   . Hypertension   . Malnutrition ([place])   . Osteoarthritis   . PAF (paroxysmal atrial fibrillation) ([place])    a. initially noted on 05/06/2018  . Pneumonia   . Prostate nodule   . Protein in urine   . Pulmonary edema   . Renal cyst   . Respiratory failure ([place])   . Tobacco abuse   . Wheezing     Past Surgical History:  Procedure Laterality Date  . ANKLE FRACTURE SURGERY    . CATARACT EXTRACTION W/PHACO Right  08/13/2018   Procedure: CATARACT EXTRACTION PHACO AND INTRAOCULAR LENS PLACEMENT ([place]) RIGHT [place];  Surgeon: Marchia Meiers, MD;  Location: [facility];  Service: Ophthalmology;  Laterality: Right;  [place]  01:16 CDE 13.15 Fluid pack lot # [id number]  . CATARACT EXTRACTION W/PHACO Left 11/26/2018   Procedure: CATARACT EXTRACTION PHACO AND INTRAOCULAR LENS PLACEMENT ([place]) LEFT;  Surgeon: Marchia Meiers, MD;  Location: [facility];  Service: Ophthalmology;  Laterality: Left;  [place] 01:03.5 CDE 11.13 Fluid Pack Lot # [id number]  . CENTRAL VENOUS CATHETER INSERTION  05/08/2018   Procedure: INSERTION CENTRAL LINE ADULT;  Surgeon:  Olean Ree, MD;  Location: [facility];  Service: General;;  . ESOPHAGOGASTRODUODENOSCOPY  05/08/2018   Procedure: ESOPHAGOGASTRODUODENOSCOPY (EGD);  Surgeon: Olean Ree, MD;  Location: [facility];  Service: General;;  . ESOPHAGOGASTRODUODENOSCOPY (EGD) WITH PROPOFOL N/A 05/03/2018   Procedure: ESOPHAGOGASTRODUODENOSCOPY (EGD) WITH PROPOFOL;  Surgeon: Tol[place], Benay Pike, MD;  Location: [facility];  Service: Gastroenterology;  Laterality: N/A;  . EXTERNAL EAR SURGERY    . INTRAMEDULLARY (IM) NAIL INTERTROCHANTERIC Left 12/01/2017   Procedure: INTRAMEDULLARY (IM) NAIL INTERTROCHANTRIC;  Surgeon: Thornton Park, MD;  Location: [facility];  Service: Orthopedics;  Laterality: Left;  . JEJUNOSTOMY  05/08/2018   Procedure: JEJUNOSTOMY;  Surgeon: Olean Ree, MD;  Location: [facility];  Service: General;;  . LAPAROTOMY N/A 05/08/2018   Procedure: EXPLORATORY LAPAROTOMY;  Surgeon: Olean Ree, MD;  Location: [facility];  Service: General;  Laterality: N/A;  . ROTATOR CUFF REPAIR    . TONSILLECTOMY    . TYMPANOPLASTY W/ MASTOIDECTOMY     Patient with reports of mastoid surgery (appears to have had surgery on TM; Left).  . VISCERAL ARTERY INTERVENTION N/A 05/04/2018   Procedure: VISCERAL ARTERY INTERVENTION;  Surgeon: Algernon Huxley, MD;  Location: [facility];  Service: Cardiovascular;  Laterality: N/A;    Family History  Problem Relation Age of Onset  . Prostate cancer Neg Hx   . Bladder Cancer Neg Hx   . Kidney cancer Neg Hx     SOCIAL HX: smoker   Current Outpatient Medications:  .  acetaminophen (TYLENOL) 650 MG CR tablet, Take 1 tablet (650 mg total) by mouth every 8 (eight) hours as needed for pain., Disp: 90 tablet, Rfl: 1 .  albuterol (PROVENTIL) (2.5 MG/3ML) 0.083% nebulizer solution, Take 2.5 mg by nebulization every 4 (four) hours as needed for wheezing or shortness of breath., Disp: , Rfl:  .  ALPRAZolam (XANAX) 0.5 MG tablet, Take 0.5 mg by mouth at bedtime., Disp: , Rfl:  .   budesonide-formoterol (SYMBICORT) 80-4.5 MCG/ACT inhaler, Inhale 2 puffs into the lungs 2 (two) times daily., Disp: 1 Inhaler, Rfl: 3 .  calcium-vitamin D (OSCAL WITH D) 500-200 MG-UNIT tablet, Take 1 tablet by mouth 2 (two) times daily., Disp: 60 tablet, Rfl: 1 .  DUREZOL 0.05 % EMUL, Place 1 drop into the right eye daily., Disp: , Rfl:  .  loperamide (IMODIUM) 2 MG capsule, Take 1 capsule (2 mg total) by mouth 4 (four) times daily as needed for diarrhea or loose stools., Disp: 10 capsule, Rfl: 0 .  mirtazapine (REMERON) 7.5 MG tablet, Take 1 tablet (7.5 mg total) by mouth at bedtime., Disp: 90 tablet, Rfl: 1 .  Multiple Vitamin (MULTIVITAMIN) tablet, Take 1 tablet by mouth daily., Disp: 90 tablet, Rfl: 1 .  pantoprazole (PROTONIX) 40 MG tablet, TAKE 1 TABLET DAILY AT 7:00 AM ON AN EMPTY STOMACH, Disp: 30 tablet, Rfl: 0 .  tamsulosin (FLOMAX) 0.4  MG CAPS capsule, Take 1 capsule (0.4 mg total) by mouth daily., Disp: 30 capsule, Rfl: 0 .  thiamine 100 MG tablet, Take 1 tablet (100 mg total) by mouth daily., Disp: 30 tablet, Rfl: 1 .  traMADol (ULTRAM) 50 MG tablet, Take 1 tablet (50 mg total) by mouth 2 (two) times daily as needed for moderate pain., Disp: 60 tablet, Rfl: 0 .  amiodarone (PACERONE) 400 MG tablet, Take 0.5 tablets (200 mg total) by mouth 2 (two) times daily., Disp: 30 tablet, Rfl: 0 .  ferrous sulfate (FEROSUL) 325 (65 FE) MG tablet, Take 1 tablet (325 mg total) by mouth daily., Disp: 30 tablet, Rfl: 0 .  metoprolol tartrate (LOPRESSOR) 25 MG tablet, Take 0.5 tablets (12.5 mg total) by mouth 2 (two) times daily., Disp: 30 tablet, Rfl: 0  EXAM: This was a telephone visit and no physical exam was completed.   ASSESSMENT AND PLAN:  Discussed the following assessment and plan:  History of MI (myocardial infarction) Intermittently having symptoms.  He has followed up with cardiology.  Patient will call the cardiology office to have them fax [place] a copy of his most recent note.  I will  have the CMA contact the patient to follow-up on this.  He will seek medical attention for any persistent symptoms.  Osteoarthritis Discussed he could be getting this every 12 hours.  We will change the prescription instructions to reflect that he can get it twice a day.  We will have the CMA contact the group home to inform them of this.  Wears dentures Patient lost his dentures.  I will check with office RN to see if she knows whether or not Medicare will pay for dentures.   No orders of the defined types were placed in this encounter.   Meds ordered this encounter  Medications  . traMADol (ULTRAM) 50 MG tablet    Sig: Take 1 tablet (50 mg total) by mouth 2 (two) times daily as needed for moderate pain.    Dispense:  60 tablet    Refill:  0     I discussed the assessment and treatment plan with the patient. The patient was provided an opportunity to ask questions and all were answered. The patient agreed with the plan and demonstrated an understanding of the instructions.   The patient was advised to call back or seek an in-person evaluation if the symptoms worsen or if the condition fails to improve as anticipated.  I provided 10 minutes of non-face-to-face time during this encounter.   [name], MD

## 2019-06-03 ENCOUNTER — Telehealth: Payer: Self-pay | Admitting: *Deleted

## 2019-06-03 NOTE — Telephone Encounter (Signed)
Community consortium referral has been placed.

## 2019-06-03 NOTE — Telephone Encounter (Signed)
-----   Message from Leone Haven, MD sent at 06/01/2019  1:49 PM EDT ----- That would be great.  ----- Message ----- From: Nanci Pina, RN Sent: 06/01/2019   1:26 PM EDT To: Leone Haven, MD    ----- Message ----- From: Leone Haven, MD Sent: 06/01/2019   1:02 PM EDT To: Nanci Pina, RN  Do you know if medicare pays for dentures?

## 2019-06-14 ENCOUNTER — Other Ambulatory Visit: Payer: Self-pay | Admitting: Family Medicine

## 2019-06-14 ENCOUNTER — Telehealth: Payer: Self-pay | Admitting: Family Medicine

## 2019-06-14 DIAGNOSIS — R69 Illness, unspecified: Secondary | ICD-10-CM | POA: Diagnosis not present

## 2019-06-14 MED ORDER — LOPERAMIDE HCL 2 MG PO CAPS: 2.0000 mg | ORAL_CAPSULE | Freq: Four times a day (QID) | ORAL | 0 refills | Status: AC | PRN

## 2019-06-14 MED ORDER — TAMSULOSIN HCL 0.4 MG PO CAPS: 0.4000 mg | ORAL_CAPSULE | Freq: Every day | ORAL | 0 refills | Status: AC

## 2019-06-14 MED ORDER — THIAMINE HCL 100 MG PO TABS: 100.0000 mg | ORAL_TABLET | Freq: Every day | ORAL | 1 refills | Status: AC

## 2019-06-14 MED ORDER — MIRTAZAPINE 7.5 MG PO TABS: 7.5000 mg | ORAL_TABLET | Freq: Every day | ORAL | 1 refills | Status: AC

## 2019-06-14 MED ORDER — ALBUTEROL SULFATE (2.5 MG/3ML) 0.083% IN NEBU: 2.5000 mg | INHALATION_SOLUTION | RESPIRATORY_TRACT | 1 refills | Status: AC | PRN

## 2019-06-14 MED ORDER — PANTOPRAZOLE SODIUM 40 MG PO TBEC: DELAYED_RELEASE_TABLET | ORAL | 0 refills | Status: DC

## 2019-06-14 MED ORDER — ACETAMINOPHEN ER 650 MG PO TBCR: 650.0000 mg | EXTENDED_RELEASE_TABLET | Freq: Three times a day (TID) | ORAL | 1 refills | Status: AC | PRN

## 2019-06-14 MED ORDER — BUDESONIDE-FORMOTEROL FUMARATE 80-4.5 MCG/ACT IN AERO: 2.0000 | INHALATION_SPRAY | Freq: Two times a day (BID) | RESPIRATORY_TRACT | 3 refills | Status: AC

## 2019-06-14 NOTE — Telephone Encounter (Signed)
Pt was in the background and said that he needs all his medication refilled besides the tramadol. I asked several times are there any specific medications that we need to fill but she just kept saying all of them. She said that she will have the pharmacy call because he is out of medication and she doesn't have the bottles in front of her.

## 2019-06-14 NOTE — Telephone Encounter (Signed)
All medication was refilled but the expired, xanax, and tramadol

## 2019-06-15 ENCOUNTER — Other Ambulatory Visit: Payer: Self-pay | Admitting: Family Medicine

## 2019-06-18 ENCOUNTER — Telehealth: Payer: Self-pay | Admitting: Family Medicine

## 2019-06-18 NOTE — Telephone Encounter (Signed)
  Community Resource Referral   [place] 06/18/2019  1st Attempt  DOB: 01-17-1934   AGE: 85 y.o.   GENDER: male   PCP Leone Haven, MD.   Called pt regarding Community Resource Referral no voicemail set up Called pt's SW A. Daye regarding Community Resource Referral lvm LMTCB Follow up on: 06/28/2019 [place] . [email or web address]  [phone]

## 2019-06-27 DIAGNOSIS — R0902 Hypoxemia: Secondary | ICD-10-CM | POA: Diagnosis not present

## 2019-06-27 DIAGNOSIS — I25119 Atherosclerotic heart disease of native coronary artery with unspecified angina pectoris: Secondary | ICD-10-CM | POA: Diagnosis not present

## 2019-06-27 DIAGNOSIS — G9340 Encephalopathy, unspecified: Secondary | ICD-10-CM | POA: Diagnosis not present

## 2019-06-27 DIAGNOSIS — J9601 Acute respiratory failure with hypoxia: Secondary | ICD-10-CM | POA: Diagnosis not present

## 2019-06-27 DIAGNOSIS — E872 Acidosis: Secondary | ICD-10-CM | POA: Diagnosis not present

## 2019-06-27 DIAGNOSIS — K529 Noninfective gastroenteritis and colitis, unspecified: Secondary | ICD-10-CM | POA: Diagnosis not present

## 2019-06-27 DIAGNOSIS — R404 Transient alteration of awareness: Secondary | ICD-10-CM | POA: Diagnosis not present

## 2019-06-27 DIAGNOSIS — J189 Pneumonia, unspecified organism: Secondary | ICD-10-CM | POA: Diagnosis not present

## 2019-06-27 DIAGNOSIS — G9341 Metabolic encephalopathy: Secondary | ICD-10-CM | POA: Diagnosis not present

## 2019-06-27 DIAGNOSIS — K559 Vascular disorder of intestine, unspecified: Secondary | ICD-10-CM | POA: Diagnosis not present

## 2019-06-27 DIAGNOSIS — A419 Sepsis, unspecified organism: Secondary | ICD-10-CM | POA: Diagnosis not present

## 2019-06-27 DIAGNOSIS — I130 Hypertensive heart and chronic kidney disease with heart failure and stage 1 through stage 4 chronic kidney disease, or unspecified chronic kidney disease: Secondary | ICD-10-CM | POA: Diagnosis not present

## 2019-06-27 DIAGNOSIS — I10 Essential (primary) hypertension: Secondary | ICD-10-CM | POA: Diagnosis not present

## 2019-06-27 DIAGNOSIS — R4020 Unspecified coma: Secondary | ICD-10-CM | POA: Diagnosis not present

## 2019-06-27 DIAGNOSIS — R0689 Other abnormalities of breathing: Secondary | ICD-10-CM | POA: Diagnosis not present

## 2019-06-27 DIAGNOSIS — K5289 Other specified noninfective gastroenteritis and colitis: Secondary | ICD-10-CM | POA: Diagnosis not present

## 2019-06-27 DIAGNOSIS — Z515 Encounter for palliative care: Secondary | ICD-10-CM | POA: Diagnosis not present

## 2019-06-27 DIAGNOSIS — Z20822 Contact with and (suspected) exposure to covid-19: Secondary | ICD-10-CM | POA: Diagnosis not present

## 2019-06-27 DIAGNOSIS — N179 Acute kidney failure, unspecified: Secondary | ICD-10-CM | POA: Diagnosis not present

## 2019-06-27 DIAGNOSIS — I509 Heart failure, unspecified: Secondary | ICD-10-CM | POA: Diagnosis not present

## 2019-06-28 ENCOUNTER — Telehealth: Payer: Self-pay | Admitting: Nurse Practitioner

## 2019-06-28 NOTE — Telephone Encounter (Signed)
I returned [facility] call. Justin Andrews currently is hospitalized at [facility] in [place]. [name] endorses the goal is for him to be discharged back to the group home with [facility]osp[facility]. [name] endorses that he has been too weak to undergo the surgery that was recommended for his cancer. [name] endorses medical goals will focus on [place]. [name] and I talked about discharging with hospice to update case management at [facility] with contact information and will be able to get equipment set up prior to discharge home. Notified hospice as he was deemed Hospice eligible by [facility] so when Hospice admission visit made wishes were too pursue surgery to have cancer removed but at this point with hospitalization, increased weakness that is not a further option.  Total time 20 minutes Documentation 5 minutes Phone discussion 15 minutes

## 2019-07-01 NOTE — Telephone Encounter (Signed)
No voicemail box set up Closing referral pending any other needs of patient. KNB

## 2019-07-08 DEATH — deceased

## 2019-07-21 ENCOUNTER — Ambulatory Visit: Payer: Medicare HMO

## 2019-11-01 IMAGING — CT CT ABDOMEN AND PELVIS WITHOUT CONTRAST
2 of 3 series · 15 of 42 positions shown, 17 images · non-contrast
Comparison: 05/02/2018 CT, UGI 05/11/2018

CLINICAL DATA: Abdominal pain, recent endoscopy and surgery

EXAM:
CT ABDOMEN AND PELVIS WITHOUT CONTRAST
TECHNIQUE: Multidetector CT imaging of the abdomen and pelvis was performed
following the standard protocol without IV contrast.

[Series 2: routine abd/pel wo · axial · 0.73mm/px · z∈[-385,-30]mm · 12 of 82 slices shown, 14 images]
[im 7/82  soft-tissue]
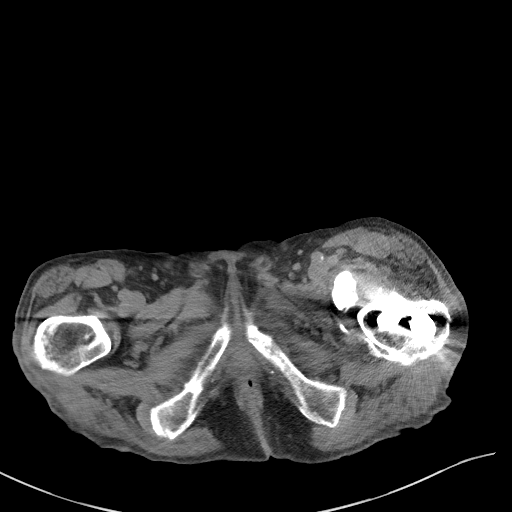
[im 7/82  bone]
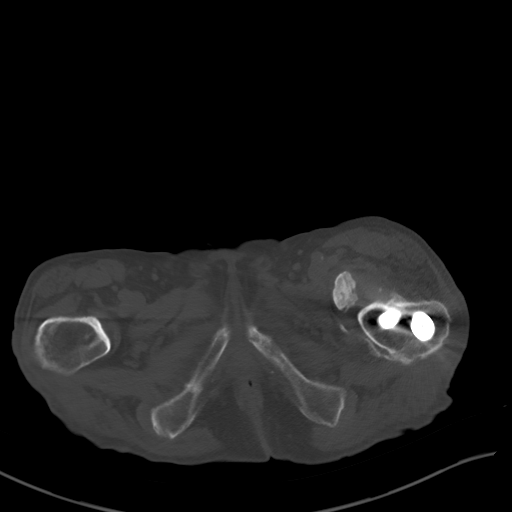
[im 13/82  soft-tissue]
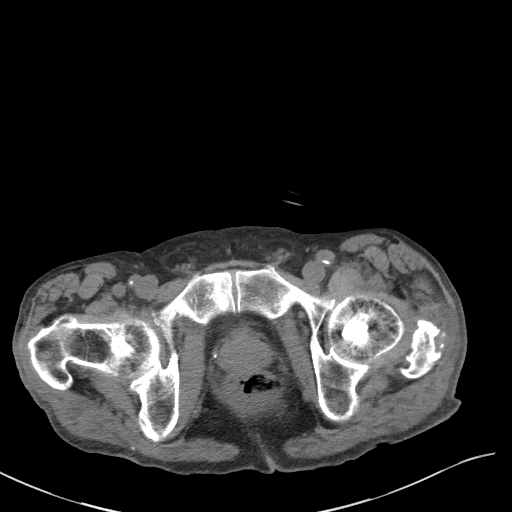
[im 20/82  soft-tissue]
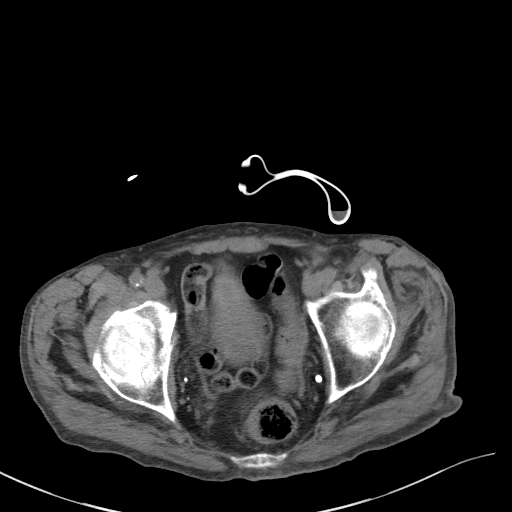
[im 26/82  soft-tissue]
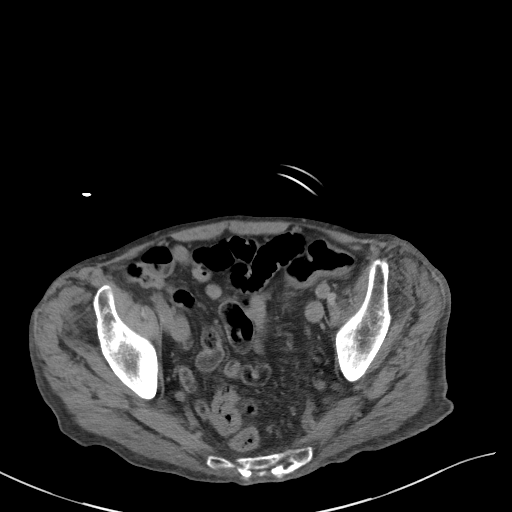
[im 33/82  soft-tissue]
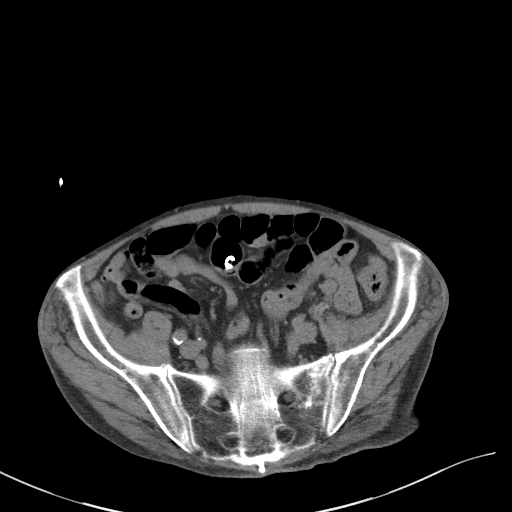
[im 39/82  soft-tissue]
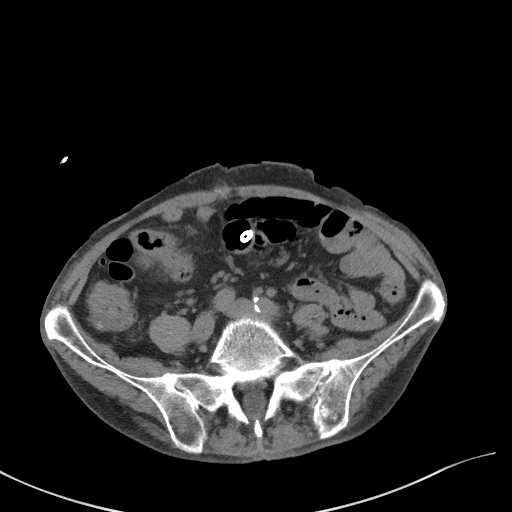
[im 46/82  soft-tissue]
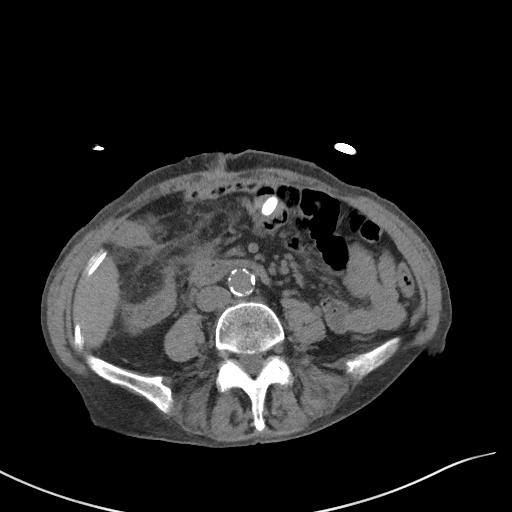
[im 52/82  soft-tissue]
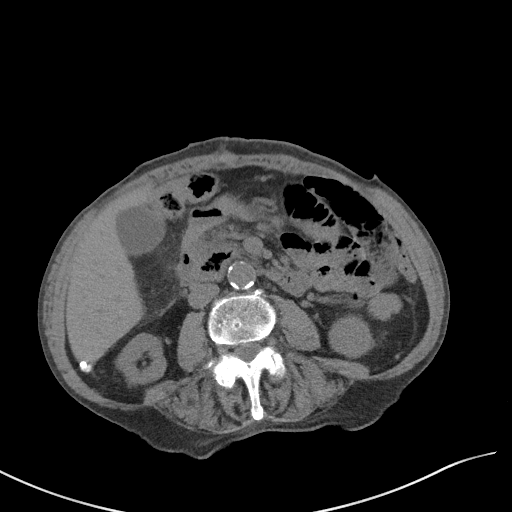
[im 59/82  soft-tissue]
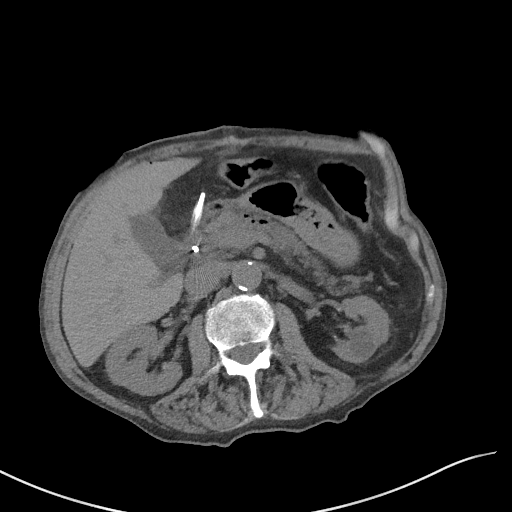
[im 59/82  bone]
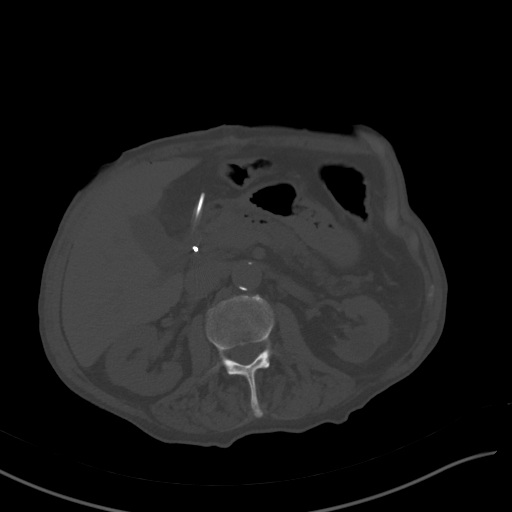
[im 65/82  soft-tissue]
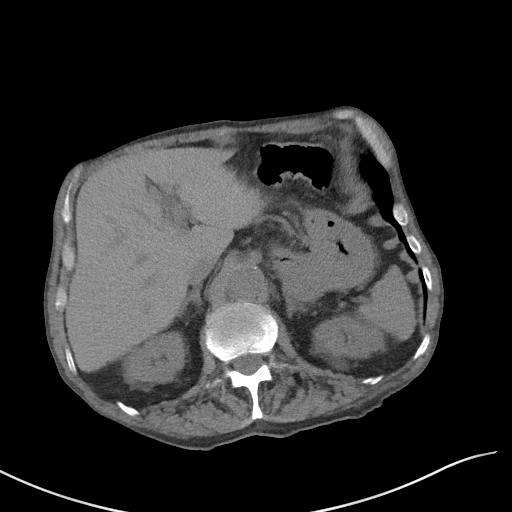
[im 72/82  soft-tissue]
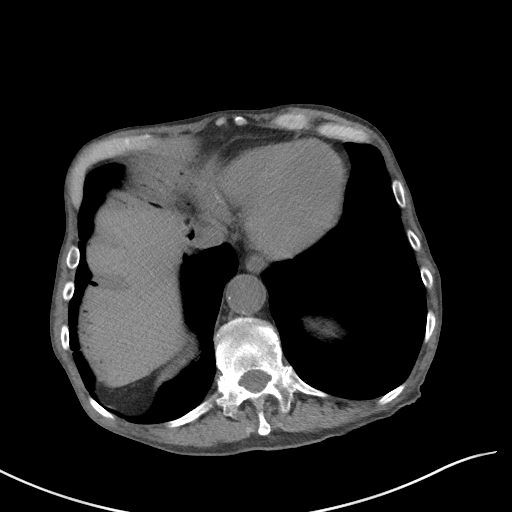
[im 78/82  soft-tissue]
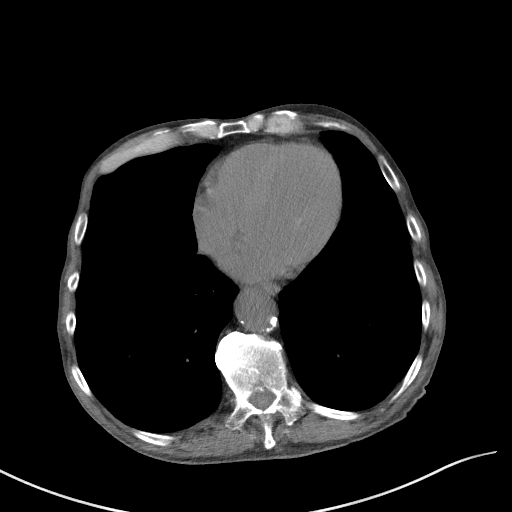

[Series 5: coronal st · coronal · 0.66mm/px · 3 of 92 slices shown]
[im 31/92  soft-tissue]
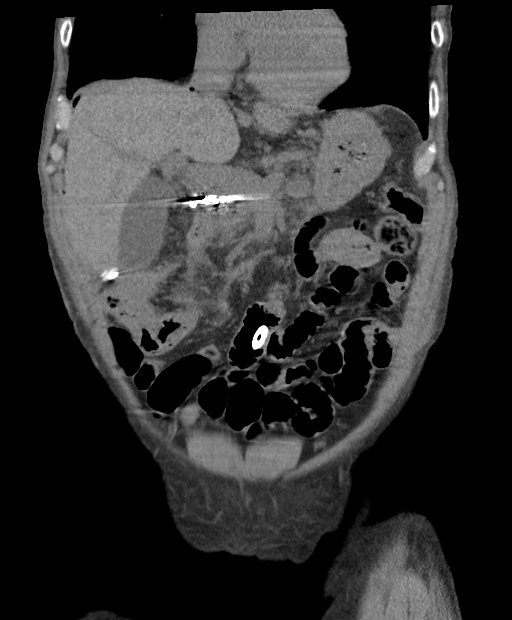
[im 41/92  soft-tissue]
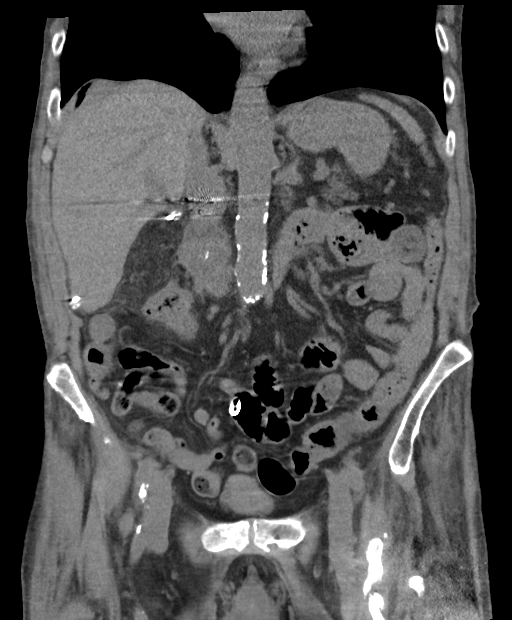
[im 51/92  soft-tissue]
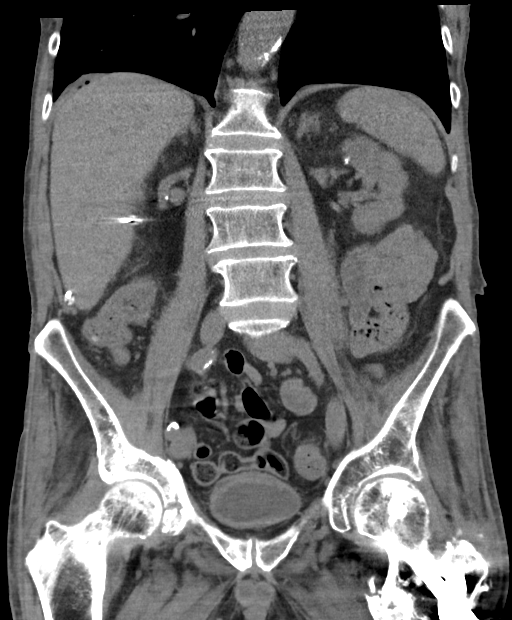

[15 of 42 positions shown; findings below may reference images not displayed]

FINDINGS: Lower chest: Lung bases demonstrate no acute consolidation or
effusion. The heart size is within normal limits.

Hepatobiliary: No focal liver abnormality is seen. No gallstones,
gallbladder wall thickening, or biliary dilatation.

Pancreas: Indistinct near the pancreatic head.

Spleen: Normal in size without focal abnormality.

Adrenals/Urinary Tract: Adrenal glands are normal. Nonspecific
perinephric fat stranding. Cysts in the left kidney. No
hydronephrosis. Slightly thick-walled appearance of the urinary
bladder.

Stomach/Bowel: The stomach is nonenlarged. Gastroduodenal junction
partially obscured by vascular coil artifact. Small foci of gas near
the duodenal bulb. Indistinct edema here. Irregular gas at the
second portion of duodenum consistent with diverticulum. No dilated
small bowel. Interim placement of left abdominal jejunostomy tube.
The tip is visible in the upper anterior pelvis bowel loop. Negative
appendix.

Vascular/Lymphatic: Moderate aortic atherosclerosis without
aneurysm. No significantly enlarged lymph nodes.

Reproductive: Slightly enlarged prostate gland

Other: No significant free fluid. Small foci of pneumoperitoneum at
the periphery of the liver. Surgical drain in the right upper
quadrant with tip terminating near the pyloric region of the
stomach. Ventral incision. No subcutaneous fluid collections. Small
fat containing right inguinal hernia. Soft tissue stranding within
the central and right upper quadrant abdominal mesentery.

Musculoskeletal: No acute or suspicious abnormality.
IMPRESSION: 1. Scattered foci of pneumoperitoneum adjacent to the liver,
presumably related to history of recent laparotomy and the presence
of an intra-abdominal drainage catheter. Edema and indistinct
appearance near the duodenal bulb and head of pancreas with small
gas collections in the region which may be residual air from
duodenal ulcer repair, however consider short interval CT follow-up
to exclude small leak or increasing air in the region. Soft tissue
stranding and edema within the central and right upper quadrant
mesentery felt to reflect resolving operative change.
2. Additional gas and fluid collection near second portion of
duodenum, felt to correspond to previously demonstrated
diverticulum.
3. New left lower quadrant feeding jejunostomy tube
4. Slightly thick-walled appearance of the urinary bladder,
questionable for cystitis

## 2019-11-02 IMAGING — DX PORTABLE CHEST - 1 VIEW
1 series · 1 of 1 positions shown · non-contrast
Comparison: Three days ago

CLINICAL DATA: Dyspnea

EXAM:
PORTABLE CHEST 1 VIEW

[chest ap]
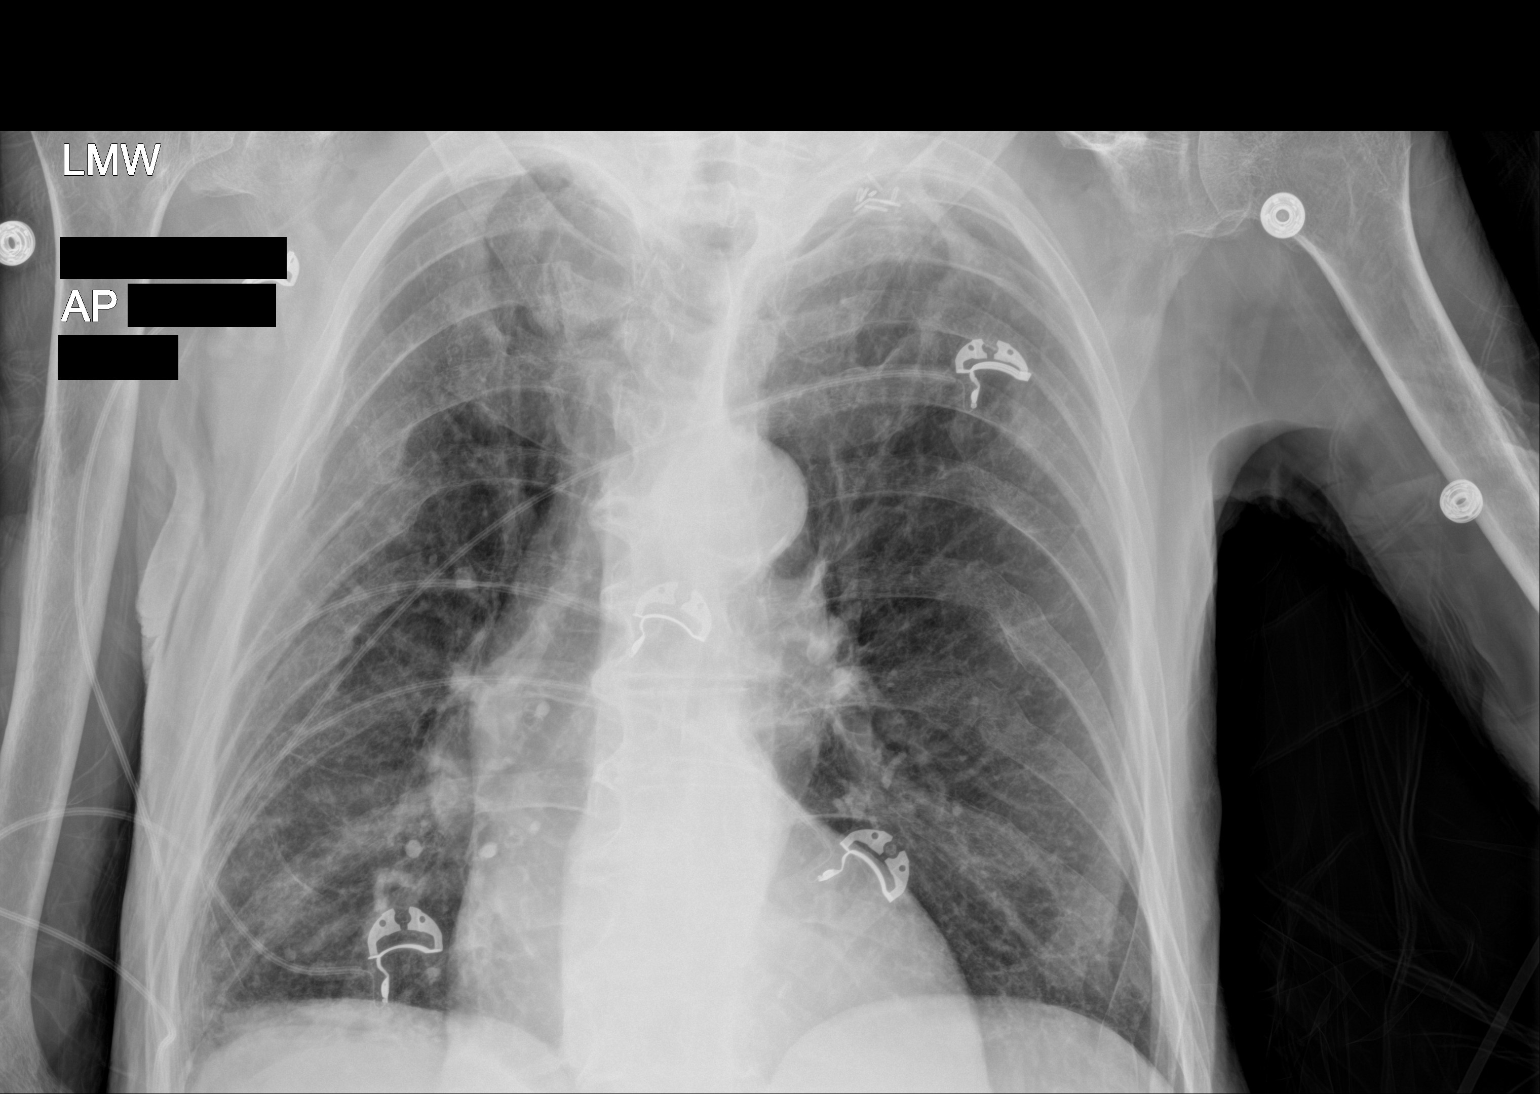

[1 of 1 positions shown; findings below may reference images not displayed]

FINDINGS: Normal heart size. Stable aortic tortuosity. Large lung volumes with
chronic interstitial coarsening. Remote bilateral rib fractures.
IMPRESSION: No evidence of acute disease.
